# Patient Record
Sex: Female | Born: 1945 | Race: Black or African American | Hispanic: No | Smoking: Former smoker
Health system: Southern US, Community
[De-identification: ages and names within clinical notes are randomized; demographics above are authoritative.]

## PROBLEM LIST (undated history)

## (undated) DIAGNOSIS — Z9109 Other allergy status, other than to drugs and biological substances: Secondary | ICD-10-CM

## (undated) DIAGNOSIS — J449 Chronic obstructive pulmonary disease, unspecified: Secondary | ICD-10-CM

## (undated) DIAGNOSIS — E7800 Pure hypercholesterolemia, unspecified: Secondary | ICD-10-CM

## (undated) DIAGNOSIS — M6281 Muscle weakness (generalized): Secondary | ICD-10-CM

## (undated) DIAGNOSIS — Q248 Other specified congenital malformations of heart: Secondary | ICD-10-CM

## (undated) DIAGNOSIS — M1990 Unspecified osteoarthritis, unspecified site: Secondary | ICD-10-CM

## (undated) DIAGNOSIS — I2510 Atherosclerotic heart disease of native coronary artery without angina pectoris: Secondary | ICD-10-CM

## (undated) DIAGNOSIS — Z9981 Dependence on supplemental oxygen: Secondary | ICD-10-CM

## (undated) DIAGNOSIS — F419 Anxiety disorder, unspecified: Secondary | ICD-10-CM

## (undated) DIAGNOSIS — E119 Type 2 diabetes mellitus without complications: Secondary | ICD-10-CM

## (undated) DIAGNOSIS — K219 Gastro-esophageal reflux disease without esophagitis: Secondary | ICD-10-CM

## (undated) DIAGNOSIS — R600 Localized edema: Secondary | ICD-10-CM

## (undated) DIAGNOSIS — F329 Major depressive disorder, single episode, unspecified: Secondary | ICD-10-CM

## (undated) DIAGNOSIS — N189 Chronic kidney disease, unspecified: Secondary | ICD-10-CM

## (undated) DIAGNOSIS — R062 Wheezing: Secondary | ICD-10-CM

## (undated) DIAGNOSIS — G4733 Obstructive sleep apnea (adult) (pediatric): Secondary | ICD-10-CM

## (undated) DIAGNOSIS — F32A Depression, unspecified: Secondary | ICD-10-CM

## (undated) DIAGNOSIS — M719 Bursopathy, unspecified: Secondary | ICD-10-CM

## (undated) DIAGNOSIS — J40 Bronchitis, not specified as acute or chronic: Secondary | ICD-10-CM

## (undated) DIAGNOSIS — E669 Obesity, unspecified: Secondary | ICD-10-CM

## (undated) DIAGNOSIS — I509 Heart failure, unspecified: Secondary | ICD-10-CM

## (undated) DIAGNOSIS — R053 Chronic cough: Secondary | ICD-10-CM

## (undated) DIAGNOSIS — R05 Cough: Secondary | ICD-10-CM

## (undated) DIAGNOSIS — I10 Essential (primary) hypertension: Secondary | ICD-10-CM

## (undated) DIAGNOSIS — F209 Schizophrenia, unspecified: Secondary | ICD-10-CM

## (undated) DIAGNOSIS — I517 Cardiomegaly: Secondary | ICD-10-CM

## (undated) DIAGNOSIS — R251 Tremor, unspecified: Secondary | ICD-10-CM

## (undated) HISTORY — DX: Obstructive sleep apnea (adult) (pediatric): G47.33

## (undated) HISTORY — PX: RENAL BIOPSY: SHX156

## (undated) HISTORY — DX: Type 2 diabetes mellitus without complications: E11.9

## (undated) HISTORY — PX: JOINT REPLACEMENT: SHX530

## (undated) HISTORY — DX: Cardiomegaly: I51.7

## (undated) HISTORY — PX: EYE SURGERY: SHX253

## (undated) HISTORY — DX: Obesity, unspecified: E66.9

## (undated) HISTORY — PX: TONSILLECTOMY: SUR1361

## (undated) HISTORY — PX: KNEE RECONSTRUCTION, MEDIAL PATELLAR FEMORAL LIGAMENT: SHX1898

## (undated) HISTORY — DX: Other specified congenital malformations of heart: Q24.8

## (undated) HISTORY — PX: CHOLECYSTECTOMY: SHX55

---

## 2003-05-25 ENCOUNTER — Other Ambulatory Visit: Payer: Self-pay

## 2003-07-12 ENCOUNTER — Other Ambulatory Visit: Payer: Self-pay

## 2004-04-17 ENCOUNTER — Ambulatory Visit: Payer: Self-pay

## 2004-12-22 ENCOUNTER — Inpatient Hospital Stay: Payer: Self-pay | Admitting: Psychiatry

## 2004-12-23 ENCOUNTER — Other Ambulatory Visit: Payer: Self-pay

## 2005-01-12 ENCOUNTER — Other Ambulatory Visit: Payer: Self-pay

## 2005-02-05 ENCOUNTER — Ambulatory Visit: Payer: Self-pay | Admitting: Unknown Physician Specialty

## 2005-04-03 ENCOUNTER — Emergency Department: Payer: Self-pay | Admitting: Emergency Medicine

## 2006-02-17 ENCOUNTER — Emergency Department: Payer: Self-pay | Admitting: Unknown Physician Specialty

## 2006-02-17 ENCOUNTER — Ambulatory Visit: Payer: Self-pay

## 2006-02-17 ENCOUNTER — Other Ambulatory Visit: Payer: Self-pay

## 2006-03-01 ENCOUNTER — Inpatient Hospital Stay: Payer: Self-pay | Admitting: General Surgery

## 2006-03-01 ENCOUNTER — Other Ambulatory Visit: Payer: Self-pay

## 2006-03-18 ENCOUNTER — Emergency Department (HOSPITAL_COMMUNITY): Admission: EM | Admit: 2006-03-18 | Discharge: 2006-03-18 | Payer: Self-pay | Admitting: Emergency Medicine

## 2006-12-10 ENCOUNTER — Emergency Department: Payer: Self-pay | Admitting: Emergency Medicine

## 2006-12-11 ENCOUNTER — Emergency Department: Payer: Self-pay | Admitting: Emergency Medicine

## 2008-01-09 ENCOUNTER — Emergency Department: Payer: Self-pay | Admitting: Internal Medicine

## 2008-01-20 ENCOUNTER — Emergency Department: Payer: Self-pay | Admitting: Emergency Medicine

## 2008-07-02 ENCOUNTER — Ambulatory Visit: Payer: Self-pay | Admitting: Nurse Practitioner

## 2008-07-09 ENCOUNTER — Encounter: Payer: Self-pay | Admitting: Nurse Practitioner

## 2008-07-23 ENCOUNTER — Encounter: Payer: Self-pay | Admitting: Nurse Practitioner

## 2009-11-19 ENCOUNTER — Emergency Department: Payer: Self-pay | Admitting: Emergency Medicine

## 2009-12-16 ENCOUNTER — Emergency Department: Payer: Self-pay | Admitting: Emergency Medicine

## 2010-02-20 ENCOUNTER — Emergency Department: Payer: Self-pay | Admitting: Emergency Medicine

## 2010-04-12 ENCOUNTER — Emergency Department: Payer: Self-pay | Admitting: Emergency Medicine

## 2010-06-11 ENCOUNTER — Emergency Department: Payer: Self-pay | Admitting: Emergency Medicine

## 2010-11-01 ENCOUNTER — Emergency Department: Payer: Self-pay | Admitting: Emergency Medicine

## 2010-11-18 ENCOUNTER — Emergency Department (HOSPITAL_COMMUNITY)
Admission: EM | Admit: 2010-11-18 | Discharge: 2010-11-18 | Disposition: A | Discharge status: Home / Self Care | Payer: Medicare Other | Attending: Emergency Medicine | Admitting: Emergency Medicine

## 2010-11-18 ENCOUNTER — Other Ambulatory Visit: Payer: Self-pay

## 2010-11-18 ENCOUNTER — Encounter: Payer: Self-pay | Admitting: Emergency Medicine

## 2010-11-18 DIAGNOSIS — M67919 Unspecified disorder of synovium and tendon, unspecified shoulder: Secondary | ICD-10-CM | POA: Insufficient documentation

## 2010-11-18 DIAGNOSIS — F209 Schizophrenia, unspecified: Secondary | ICD-10-CM | POA: Insufficient documentation

## 2010-11-18 DIAGNOSIS — E119 Type 2 diabetes mellitus without complications: Secondary | ICD-10-CM | POA: Insufficient documentation

## 2010-11-18 DIAGNOSIS — M719 Bursopathy, unspecified: Secondary | ICD-10-CM | POA: Insufficient documentation

## 2010-11-18 DIAGNOSIS — E7800 Pure hypercholesterolemia, unspecified: Secondary | ICD-10-CM | POA: Insufficient documentation

## 2010-11-18 DIAGNOSIS — I10 Essential (primary) hypertension: Secondary | ICD-10-CM

## 2010-11-18 HISTORY — DX: Bursopathy, unspecified: M71.9

## 2010-11-18 HISTORY — DX: Unspecified osteoarthritis, unspecified site: M19.90

## 2010-11-18 HISTORY — DX: Essential (primary) hypertension: I10

## 2010-11-18 HISTORY — DX: Pure hypercholesterolemia, unspecified: E78.00

## 2010-11-18 HISTORY — DX: Schizophrenia, unspecified: F20.9

## 2010-11-18 LAB — CBC
HCT: 44.2 % (ref 36.0–46.0)
MCHC: 32.8 g/dL (ref 30.0–36.0)
MCV: 86.7 fL (ref 78.0–100.0)
Platelets: 387 10*3/uL (ref 150–400)
RDW: 13.7 % (ref 11.5–15.5)

## 2010-11-18 LAB — DIFFERENTIAL
Basophils Absolute: 0.1 10*3/uL (ref 0.0–0.1)
Basophils Relative: 0 % (ref 0–1)
Eosinophils Absolute: 0.1 10*3/uL (ref 0.0–0.7)
Eosinophils Relative: 1 % (ref 0–5)
Lymphocytes Relative: 28 % (ref 12–46)
Monocytes Absolute: 1.2 10*3/uL — ABNORMAL HIGH (ref 0.1–1.0)
Neutro Abs: 8.3 10*3/uL — ABNORMAL HIGH (ref 1.7–7.7)
Neutrophils Relative %: 62 % (ref 43–77)

## 2010-11-18 LAB — COMPREHENSIVE METABOLIC PANEL
AST: 25 U/L (ref 0–37)
Albumin: 3.6 g/dL (ref 3.5–5.2)
Alkaline Phosphatase: 76 U/L (ref 39–117)
BUN: 18 mg/dL (ref 6–23)
Potassium: 3.5 mEq/L (ref 3.5–5.1)
Total Bilirubin: 0.2 mg/dL — ABNORMAL LOW (ref 0.3–1.2)

## 2010-11-18 LAB — CK TOTAL AND CKMB (NOT AT ARMC)
CK, MB: 3.2 ng/mL (ref 0.3–4.0)
Total CK: 182 U/L — ABNORMAL HIGH (ref 7–177)

## 2010-11-18 NOTE — ED Notes (Signed)
Pt requesting food.EDP aware.  Pt advocate taking pt food at this time.

## 2010-11-18 NOTE — ED Notes (Signed)
Patient is wanting something to eat. I told patient that she had to wait and be seen by the doctor before she could have anything to eat or drink.

## 2010-11-18 NOTE — ED Provider Notes (Addendum)
History    Scribed for Geoffery Lyons, MD, the patient was seen in room APA07/APA07. This chart was scribed by Katha Cabal. This patient's care was started at 17:00.     CSN: [id number] Arrival date & time: 11/18/2010  4:55 PM  Chief Complaint  Patient presents with  . Hypertension    HPI  [name] [name] is a 65 y.o. female patient who was sent to ED by [facility] center for elevated blood pressure.  Patient denies any chest pain, sob, or other complaints.  This was noted today at the doctors office.  Unsure of how long it has been high.  There are no aggravating or alleviating factors.  She does not recall if she took her meds today or not.      Patient has bursitis  In both shoulders.  Pain worsens while rising arm.    With associated   Denies headache, chest pain, vomiting,  Previous Hx  Denies MI and stents.   PCP  HPI ELEMENTS:  Location:  Onset: today Duration: several hours  Timing: improving  Quality:   Severity: moderate  Modifying factors: pain from bursitis in shoulders  Context: none as above  Associated symptoms: as above.  PAST MEDICAL HISTORY:  Past Medical History  Diagnosis Date  . Diabetes mellitus   . Hypertension   . Hypercholesteremia   . Schizophrenia   . Osteoarthritis   . Bursitis     PAST SURGICAL HISTORY:  Past Surgical History  Procedure Date  . Tonsillectomy   . Knee reconstruction, medial patellar femoral ligament   . Cholecystectomy     FAMILY HISTORY:  History reviewed. No pertinent family history.   SOCIAL HISTORY: History   Social History  . Marital Status: Unknown    Spouse Name: N/A    Number of Children: N/A  . Years of Education: N/A   Social History Main Topics  . Smoking status: Former Games developer  . Smokeless tobacco: None  . Alcohol Use: No  . Drug Use: No  . Sexually Active:    Other Topics Concern  . None   Social History Narrative  . None    Review of Systems 10 Systems reviewed and  are negative for acute change except as noted in the HPI.  Allergies  Haldol  Home Medications  No current outpatient prescriptions on file.  BP 143/127  Pulse 82  Temp(Src) 98.1 F (36.7 C) (Oral)  Resp 20  Ht 5\' 6"  (1.676 m)  Wt 271 lb (122.925 kg)  BMI 43.74 kg/m2  SpO2 98%  Physical Exam  Nursing note and vitals reviewed. Constitutional: She is oriented to person, place, and time. She appears well-developed and well-nourished. No distress.  HENT:  Head: Normocephalic and atraumatic.  Mouth/Throat: Oropharynx is clear and moist.  Eyes: EOM are normal. Pupils are equal, round, and reactive to light.  Neck: Normal range of motion. Neck supple.  Cardiovascular: Normal rate, regular rhythm and normal heart sounds.   No murmur heard. Pulmonary/Chest: Effort normal and breath sounds normal. No respiratory distress. She has no wheezes. She has no rales.  Abdominal: Soft. Bowel sounds are normal. There is no tenderness. There is no rebound and no guarding.  Musculoskeletal: Normal range of motion. She exhibits no edema (peripheral ).  Neurological: She is alert and oriented to person, place, and time. No sensory deficit. Coordination normal.  Skin: Skin is warm and dry. No rash noted. She is not diaphoretic.  Psychiatric: She has a normal  mood and affect. Her speech is normal.    ED Course  Procedures (including critical care time)  OTHER DATA REVIEWED: Nursing notes, vital signs, and past medical records reviewed.   DIAGNOSTIC STUDIES: Oxygen Saturation is 98% on room air, normal by my interpretation.      Input EKG    LABS / RADIOLOGY:  Results for orders placed during the hospital encounter of 11/18/10  CBC      Component Value Range   WBC 13.5 (*) 4.0 - 10.5 (K/uL)   RBC 5.10  3.87 - 5.11 (MIL/uL)   Hemoglobin 14.5  12.0 - 15.0 (g/dL)   HCT 16.1  09.6 - 04.5 (%)   MCV 86.7  78.0 - 100.0 (fL)   MCH 28.4  26.0 - 34.0 (pg)   MCHC 32.8  30.0 - 36.0 (g/dL)   RDW  40.9  81.1 - 91.4 (%)   Platelets 387  150 - 400 (K/uL)  DIFFERENTIAL      Component Value Range   Neutrophils Relative 62  43 - 77 (%)   Neutro Abs 8.3 (*) 1.7 - 7.7 (K/uL)   Lymphocytes Relative 28  12 - 46 (%)   Lymphs Abs 3.9  0.7 - 4.0 (K/uL)   Monocytes Relative 9  3 - 12 (%)   Monocytes Absolute 1.2 (*) 0.1 - 1.0 (K/uL)   Eosinophils Relative 1  0 - 5 (%)   Eosinophils Absolute 0.1  0.0 - 0.7 (K/uL)   Basophils Relative 0  0 - 1 (%)   Basophils Absolute 0.1  0.0 - 0.1 (K/uL)   .  No results found.     ED COURSE / COORDINATION OF CARE: 5:14 PM  Physical exam complete.  Will order cardiac markers and EKG.    Orders Placed This Encounter  Procedures  . CBC  . Differential  . Comprehensive metabolic panel  . CK total and CKMB  . Troponin I  . EKG test         MDM   MDM:   Raynelle Fanning shows nsr at 71 bpm.  BP has come down here without intervention.  Looks fine by exam and labs are unremarkable.  Will discharge with bp monitoring, follow up with pcp.   IMPRESSION: Diagnoses that have been ruled out:  Diagnoses that are still under consideration:  Final diagnoses:     MEDICATIONS GIVEN IN THE E.D. Scheduled Meds:   Continuous Infusions:      DISCHARGE MEDICATIONS: New Prescriptions   No medications on file     I personally performed the services described in this documentation, which was scribed in my presence. The recorded information has been reviewed and considered. Geoffery Lyons, MD            Geoffery Lyons, MD 11/18/10 Bennie Hind  Geoffery Lyons, MD 12/10/10 [phone]

## 2010-11-18 NOTE — ED Notes (Signed)
Pt went to [facility] today for "bursitis in both of my arms". Pt had elevated bp at office so sent to ED. Pt cbg in route 142. Pt alert/oriented. Nad. Pt could not remember if took meds today or not. Denies cp/sob or headaches.

## 2010-11-25 ENCOUNTER — Emergency Department: Payer: Self-pay | Admitting: *Deleted

## 2011-04-23 HISTORY — PX: COLONOSCOPY: SHX174

## 2011-05-11 ENCOUNTER — Emergency Department: Payer: Self-pay | Admitting: Emergency Medicine

## 2011-05-11 LAB — URINALYSIS, COMPLETE
Bilirubin,UR: NEGATIVE
Hyaline Cast: 51
Ketone: NEGATIVE
Nitrite: NEGATIVE
Ph: 5 (ref 4.5–8.0)
Protein: 100
RBC,UR: 1 /HPF (ref 0–5)
Specific Gravity: 1.017 (ref 1.003–1.030)
Squamous Epithelial: 2

## 2011-05-11 LAB — CBC
HCT: 42.8 % (ref 35.0–47.0)
HGB: 14.1 g/dL (ref 12.0–16.0)
MCV: 86 fL (ref 80–100)
RDW: 12.9 % (ref 11.5–14.5)
WBC: 11.4 10*3/uL — ABNORMAL HIGH (ref 3.6–11.0)

## 2011-05-11 LAB — COMPREHENSIVE METABOLIC PANEL
Alkaline Phosphatase: 61 U/L (ref 50–136)
Anion Gap: 16 (ref 7–16)
Calcium, Total: 9 mg/dL (ref 8.5–10.1)
Co2: 25 mmol/L (ref 21–32)
EGFR (Non-African Amer.): 39 — ABNORMAL LOW
Osmolality: 291 (ref 275–301)
Sodium: 145 mmol/L (ref 136–145)

## 2011-05-11 LAB — DRUG SCREEN, URINE
Barbiturates, Ur Screen: NEGATIVE (ref ?–200)
Benzodiazepine, Ur Scrn: NEGATIVE (ref ?–200)
Cannabinoid 50 Ng, Ur ~~LOC~~: NEGATIVE (ref ?–50)
MDMA (Ecstasy)Ur Screen: NEGATIVE (ref ?–500)
Methadone, Ur Screen: NEGATIVE (ref ?–300)
Phencyclidine (PCP) Ur S: NEGATIVE (ref ?–25)

## 2011-05-11 LAB — ETHANOL
Ethanol %: <0.003 % (ref 0.000–0.080)
Ethanol: <3 mg/dL

## 2011-06-18 ENCOUNTER — Emergency Department: Payer: Self-pay | Admitting: *Deleted

## 2011-06-24 ENCOUNTER — Emergency Department (HOSPITAL_COMMUNITY): Payer: Medicare Other

## 2011-06-24 ENCOUNTER — Inpatient Hospital Stay (HOSPITAL_COMMUNITY): Payer: Medicare Other

## 2011-06-24 ENCOUNTER — Inpatient Hospital Stay (HOSPITAL_COMMUNITY)
Admission: EM | Admit: 2011-06-24 | Discharge: 2011-06-30 | DRG: 394 | Disposition: A | Discharge status: Other Acute Inpatient Hospital | Payer: Medicare Other | Attending: Internal Medicine | Admitting: Internal Medicine

## 2011-06-24 ENCOUNTER — Encounter (HOSPITAL_COMMUNITY): Payer: Self-pay | Admitting: Emergency Medicine

## 2011-06-24 DIAGNOSIS — I10 Essential (primary) hypertension: Secondary | ICD-10-CM

## 2011-06-24 DIAGNOSIS — K625 Hemorrhage of anus and rectum: Secondary | ICD-10-CM | POA: Diagnosis present

## 2011-06-24 DIAGNOSIS — R059 Cough, unspecified: Secondary | ICD-10-CM | POA: Diagnosis not present

## 2011-06-24 DIAGNOSIS — M899 Disorder of bone, unspecified: Secondary | ICD-10-CM | POA: Diagnosis present

## 2011-06-24 DIAGNOSIS — E119 Type 2 diabetes mellitus without complications: Secondary | ICD-10-CM | POA: Diagnosis present

## 2011-06-24 DIAGNOSIS — M1990 Unspecified osteoarthritis, unspecified site: Secondary | ICD-10-CM | POA: Diagnosis present

## 2011-06-24 DIAGNOSIS — J029 Acute pharyngitis, unspecified: Secondary | ICD-10-CM | POA: Diagnosis not present

## 2011-06-24 DIAGNOSIS — K649 Unspecified hemorrhoids: Principal | ICD-10-CM | POA: Diagnosis present

## 2011-06-24 DIAGNOSIS — E876 Hypokalemia: Secondary | ICD-10-CM | POA: Diagnosis present

## 2011-06-24 DIAGNOSIS — E7800 Pure hypercholesterolemia, unspecified: Secondary | ICD-10-CM | POA: Diagnosis present

## 2011-06-24 DIAGNOSIS — N289 Disorder of kidney and ureter, unspecified: Secondary | ICD-10-CM | POA: Diagnosis present

## 2011-06-24 DIAGNOSIS — M898X5 Other specified disorders of bone, thigh: Secondary | ICD-10-CM | POA: Diagnosis present

## 2011-06-24 DIAGNOSIS — R05 Cough: Secondary | ICD-10-CM | POA: Diagnosis not present

## 2011-06-24 DIAGNOSIS — F209 Schizophrenia, unspecified: Secondary | ICD-10-CM

## 2011-06-24 DIAGNOSIS — N2889 Other specified disorders of kidney and ureter: Secondary | ICD-10-CM | POA: Diagnosis present

## 2011-06-24 DIAGNOSIS — M949 Disorder of cartilage, unspecified: Secondary | ICD-10-CM | POA: Diagnosis present

## 2011-06-24 LAB — BASIC METABOLIC PANEL
CO2: 29 mEq/L (ref 19–32)
Creatinine, Ser: 0.79 mg/dL (ref 0.50–1.10)
Potassium: 3.3 mEq/L — ABNORMAL LOW (ref 3.5–5.1)
Sodium: 141 mEq/L (ref 135–145)

## 2011-06-24 LAB — GLUCOSE, CAPILLARY: Glucose-Capillary: 182 mg/dL — ABNORMAL HIGH (ref 70–99)

## 2011-06-24 LAB — DIFFERENTIAL
Basophils Relative: 0 % (ref 0–1)
Eosinophils Relative: 2 % (ref 0–5)
Lymphs Abs: 4 10*3/uL (ref 0.7–4.0)
Monocytes Absolute: 0.9 10*3/uL (ref 0.1–1.0)
Neutrophils Relative %: 54 % (ref 43–77)

## 2011-06-24 LAB — CBC
MCH: 27.7 pg (ref 26.0–34.0)
WBC: 11.1 10*3/uL — ABNORMAL HIGH (ref 4.0–10.5)

## 2011-06-24 LAB — TYPE AND SCREEN

## 2011-06-24 LAB — OCCULT BLOOD, POC DEVICE: Fecal Occult Bld: POSITIVE

## 2011-06-24 MED ORDER — INSULIN ASPART 100 UNIT/ML ~~LOC~~ SOLN
0.0000 [IU] | Freq: Three times a day (TID) | SUBCUTANEOUS | Status: DC
  Administered 2011-06-25 – 2011-06-26 (×4): 3 [IU] via SUBCUTANEOUS
  Administered 2011-06-26 (×2): 2 [IU] via SUBCUTANEOUS
  Administered 2011-06-27 (×3): 3 [IU] via SUBCUTANEOUS
  Administered 2011-06-29 – 2011-06-30 (×3): 5 [IU] via SUBCUTANEOUS
  Administered 2011-06-30: 3 [IU] via SUBCUTANEOUS

## 2011-06-24 MED ORDER — DIVALPROEX SODIUM 250 MG PO DR TAB
500.0000 mg | DELAYED_RELEASE_TABLET | Freq: Every day | ORAL | Status: DC
  Filled 2011-06-24 (×4): qty 2

## 2011-06-24 MED ORDER — MORPHINE SULFATE 4 MG/ML IJ SOLN
4.0000 mg | Freq: Once | INTRAMUSCULAR | Status: AC
  Administered 2011-06-24: 4 mg via INTRAVENOUS
  Filled 2011-06-24: qty 1

## 2011-06-24 MED ORDER — ZIPRASIDONE HCL 40 MG PO CAPS
40.0000 mg | ORAL_CAPSULE | Freq: Every day | ORAL | Status: DC
  Filled 2011-06-24 (×8): qty 1

## 2011-06-24 MED ORDER — SODIUM CHLORIDE 0.9 % IV SOLN
Freq: Once | INTRAVENOUS | Status: AC
  Administered 2011-06-24: 14:00:00 via INTRAVENOUS

## 2011-06-24 MED ORDER — FLUPHENAZINE HCL 5 MG PO TABS
ORAL_TABLET | ORAL | Status: AC
  Filled 2011-06-24: qty 1

## 2011-06-24 MED ORDER — FUROSEMIDE 20 MG PO TABS
20.0000 mg | ORAL_TABLET | Freq: Every day | ORAL | Status: DC
  Administered 2011-06-24 – 2011-06-29 (×5): 20 mg via ORAL
  Filled 2011-06-24 (×6): qty 1

## 2011-06-24 MED ORDER — ACETAMINOPHEN 325 MG PO TABS: 650.0000 mg | ORAL_TABLET | Freq: Four times a day (QID) | ORAL | Status: DC | PRN

## 2011-06-24 MED ORDER — LOSARTAN POTASSIUM 50 MG PO TABS
50.0000 mg | ORAL_TABLET | Freq: Every day | ORAL | Status: DC
  Administered 2011-06-24 – 2011-06-29 (×4): 50 mg via ORAL
  Filled 2011-06-24 (×6): qty 1

## 2011-06-24 MED ORDER — TRAMADOL HCL 50 MG PO TABS: 50.0000 mg | ORAL_TABLET | Freq: Four times a day (QID) | ORAL | Status: DC | PRN

## 2011-06-24 MED ORDER — ACETAMINOPHEN 650 MG RE SUPP: 650.0000 mg | Freq: Four times a day (QID) | RECTAL | Status: DC | PRN

## 2011-06-24 MED ORDER — POTASSIUM CHLORIDE CRYS ER 20 MEQ PO TBCR
40.0000 meq | EXTENDED_RELEASE_TABLET | Freq: Once | ORAL | Status: AC
  Administered 2011-06-24: 40 meq via ORAL
  Filled 2011-06-24: qty 2

## 2011-06-24 MED ORDER — SODIUM CHLORIDE 0.9 % IV SOLN: INTRAVENOUS | Status: DC

## 2011-06-24 MED ORDER — ONDANSETRON HCL 4 MG/2ML IJ SOLN: 4.0000 mg | Freq: Four times a day (QID) | INTRAMUSCULAR | Status: DC | PRN

## 2011-06-24 MED ORDER — TRAZODONE HCL 50 MG PO TABS
200.0000 mg | ORAL_TABLET | Freq: Every day | ORAL | Status: DC
  Administered 2011-06-24: 200 mg via ORAL
  Filled 2011-06-24 (×3): qty 4

## 2011-06-24 MED ORDER — FLUPHENAZINE HCL 5 MG PO TABS
5.0000 mg | ORAL_TABLET | Freq: Every day | ORAL | Status: DC
  Administered 2011-06-24 – 2011-06-29 (×4): 5 mg via ORAL
  Filled 2011-06-24 (×8): qty 1

## 2011-06-24 MED ORDER — INSULIN ASPART 100 UNIT/ML ~~LOC~~ SOLN
0.0000 [IU] | Freq: Every day | SUBCUTANEOUS | Status: DC
  Administered 2011-06-29: 2 [IU] via SUBCUTANEOUS

## 2011-06-24 MED ORDER — ADULT MULTIVITAMIN W/MINERALS CH
1.0000 | ORAL_TABLET | Freq: Every day | ORAL | Status: DC
  Administered 2011-06-24 – 2011-06-29 (×5): 1 via ORAL
  Filled 2011-06-24 (×6): qty 1

## 2011-06-24 MED ORDER — HYDROCODONE-ACETAMINOPHEN 5-325 MG PO TABS
1.0000 | ORAL_TABLET | Freq: Three times a day (TID) | ORAL | Status: DC
  Administered 2011-06-24 – 2011-06-25 (×4): 1 via ORAL
  Filled 2011-06-24 (×4): qty 1

## 2011-06-24 MED ORDER — CLONIDINE HCL 0.1 MG PO TABS
0.1000 mg | ORAL_TABLET | Freq: Four times a day (QID) | ORAL | Status: DC | PRN
  Administered 2011-06-29: 0.1 mg via ORAL
  Filled 2011-06-24 (×2): qty 1

## 2011-06-24 MED ORDER — CLONIDINE HCL 0.2 MG/24HR TD PTWK
0.2000 mg | MEDICATED_PATCH | TRANSDERMAL | Status: DC
  Administered 2011-06-24: 0.2 mg via TRANSDERMAL
  Filled 2011-06-24: qty 1

## 2011-06-24 MED ORDER — ONDANSETRON HCL 4 MG PO TABS
4.0000 mg | ORAL_TABLET | Freq: Four times a day (QID) | ORAL | Status: DC | PRN
  Filled 2011-06-24: qty 1

## 2011-06-24 MED ORDER — LISINOPRIL 10 MG PO TABS
20.0000 mg | ORAL_TABLET | Freq: Every day | ORAL | Status: DC
  Administered 2011-06-24 – 2011-06-29 (×5): 20 mg via ORAL
  Filled 2011-06-24 (×3): qty 2
  Filled 2011-06-24: qty 1
  Filled 2011-06-24 (×2): qty 2

## 2011-06-24 MED ORDER — FAMOTIDINE 20 MG PO TABS
20.0000 mg | ORAL_TABLET | Freq: Every day | ORAL | Status: DC
  Administered 2011-06-24 – 2011-06-25 (×2): 20 mg via ORAL
  Filled 2011-06-24 (×2): qty 1

## 2011-06-24 MED ORDER — ZIPRASIDONE HCL 40 MG PO CAPS
ORAL_CAPSULE | ORAL | Status: AC
  Filled 2011-06-24: qty 1

## 2011-06-24 MED ORDER — PANTOPRAZOLE SODIUM 40 MG IV SOLR
40.0000 mg | INTRAVENOUS | Status: DC
  Administered 2011-06-24: 40 mg via INTRAVENOUS
  Filled 2011-06-24: qty 40

## 2011-06-24 MED ORDER — AMLODIPINE BESYLATE 5 MG PO TABS
10.0000 mg | ORAL_TABLET | Freq: Every day | ORAL | Status: DC
  Administered 2011-06-24 – 2011-06-29 (×5): 10 mg via ORAL
  Filled 2011-06-24 (×6): qty 2

## 2011-06-24 MED ORDER — CHLORTHALIDONE 25 MG PO TABS
25.0000 mg | ORAL_TABLET | Freq: Every day | ORAL | Status: DC
  Administered 2011-06-24 – 2011-06-29 (×4): 25 mg via ORAL
  Filled 2011-06-24 (×9): qty 1

## 2011-06-24 MED ORDER — CHLORTHALIDONE 25 MG PO TABS
ORAL_TABLET | ORAL | Status: AC
  Filled 2011-06-24: qty 1

## 2011-06-24 NOTE — ED Notes (Signed)
Report given to [place], RN unit 300. Room is in the process of being cleaned. Will transport patient to floor when room is finished.

## 2011-06-24 NOTE — Progress Notes (Signed)
To CT per wheelchair. Contrast complete.
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
## 2011-06-24 NOTE — ED Notes (Signed)
Pt requested blood pressure recheck in left arm it is 159/96.

## 2011-06-25 ENCOUNTER — Inpatient Hospital Stay (HOSPITAL_COMMUNITY): Payer: Medicare Other

## 2011-06-25 ENCOUNTER — Encounter (HOSPITAL_COMMUNITY): Payer: Self-pay | Admitting: Gastroenterology

## 2011-06-25 DIAGNOSIS — F209 Schizophrenia, unspecified: Secondary | ICD-10-CM

## 2011-06-25 DIAGNOSIS — I10 Essential (primary) hypertension: Secondary | ICD-10-CM

## 2011-06-25 DIAGNOSIS — M898X5 Other specified disorders of bone, thigh: Secondary | ICD-10-CM | POA: Diagnosis present

## 2011-06-25 DIAGNOSIS — K625 Hemorrhage of anus and rectum: Secondary | ICD-10-CM

## 2011-06-25 LAB — BASIC METABOLIC PANEL WITH GFR
BUN: 13 mg/dL (ref 6–23)
CO2: 30 meq/L (ref 19–32)
Calcium: 9.4 mg/dL (ref 8.4–10.5)
Chloride: 97 meq/L (ref 96–112)
Creatinine, Ser: 0.9 mg/dL (ref 0.50–1.10)
GFR calc Af Amer: 76 mL/min — ABNORMAL LOW
GFR calc non Af Amer: 65 mL/min — ABNORMAL LOW
Glucose, Bld: 159 mg/dL — ABNORMAL HIGH (ref 70–99)
Potassium: 3.3 meq/L — ABNORMAL LOW (ref 3.5–5.1)
Sodium: 138 meq/L (ref 135–145)

## 2011-06-25 LAB — CBC
HCT: 40.8 % (ref 36.0–46.0)
Hemoglobin: 12.9 g/dL (ref 12.0–15.0)
MCH: 27.1 pg (ref 26.0–34.0)
MCHC: 31.6 g/dL (ref 30.0–36.0)
MCV: 85.7 fL (ref 78.0–100.0)
Platelets: 298 K/uL (ref 150–400)
RBC: 4.76 MIL/uL (ref 3.87–5.11)
RDW: 13.9 % (ref 11.5–15.5)
WBC: 9.9 K/uL (ref 4.0–10.5)

## 2011-06-25 LAB — GLUCOSE, CAPILLARY
Glucose-Capillary: 168 mg/dL — ABNORMAL HIGH (ref 70–99)
Glucose-Capillary: 175 mg/dL — ABNORMAL HIGH (ref 70–99)

## 2011-06-25 MED ORDER — POTASSIUM CHLORIDE CRYS ER 20 MEQ PO TBCR
40.0000 meq | EXTENDED_RELEASE_TABLET | Freq: Every day | ORAL | Status: DC
  Administered 2011-06-25 – 2011-06-29 (×4): 40 meq via ORAL
  Filled 2011-06-25 (×5): qty 2

## 2011-06-25 MED ORDER — SODIUM CHLORIDE 0.9 % IJ SOLN
INTRAMUSCULAR | Status: AC
  Administered 2011-06-25: 10 mL
  Filled 2011-06-25: qty 3

## 2011-06-25 MED ORDER — PANTOPRAZOLE SODIUM 40 MG PO TBEC
40.0000 mg | DELAYED_RELEASE_TABLET | Freq: Every day | ORAL | Status: DC
  Administered 2011-06-25 – 2011-06-26 (×2): 40 mg via ORAL
  Filled 2011-06-25 (×2): qty 1

## 2011-06-25 MED ORDER — LORAZEPAM 2 MG/ML IJ SOLN
2.0000 mg | Freq: Once | INTRAMUSCULAR | Status: AC
  Administered 2011-06-25: 2 mg via INTRAVENOUS
  Filled 2011-06-25: qty 1

## 2011-06-25 MED ORDER — HYDROCORTISONE ACETATE 25 MG RE SUPP
25.0000 mg | Freq: Two times a day (BID) | RECTAL | Status: DC
  Administered 2011-06-25 – 2011-06-27 (×3): 25 mg via RECTAL
  Filled 2011-06-25 (×15): qty 1

## 2011-06-25 NOTE — Progress Notes (Signed)
Patient up in chair for about an hour.

## 2011-06-25 NOTE — Consult Note (Signed)
LOW VOLUME HEMATOCHEZIA. HOLD ASA. FULL LIQUID DIET. ANUSOL HC PR BID FOR 10 DAYS.  REVIEWED.

## 2011-06-25 NOTE — Care Management Note (Signed)
    Page 1 of 1   06/30/2011     1:19:27 PM   CARE MANAGEMENT NOTE 06/30/2011  Patient:  [name] [name], [name] [name]   Account Number:  [id number]  Date Initiated:  06/25/2011  Documentation initiated by:  Rosemary Holms  Subjective/Objective Assessment:   Pt admitted with blood in stool. Lived at home alone and states she has many friends in her church family. Walks with cane. States she previously had HH but not sure which agency she had.     Action/Plan:   Plans to DC home. Seems open to home health   Anticipated DC Date:  06/29/2011   Anticipated DC Plan:  [facility]      DC Planning Services  CM consult      Choice offered to / List presented to:             Status of service:  Completed, signed off Medicare Important Message given?   (If response is "NO", the following Medicare IM given date fields will be blank) Date Medicare IM given:   Date Additional Medicare IM given:    Discharge Disposition:  [facility]OS[facility]  Per UR Regulation:  Reviewed for med. necessity/level of care/duration of stay  If discussed at Long Length of Stay Meetings, dates discussed:    Comments:  06/30/11 1315 Keila Turan RN BSN CM Pt to be transfered to [facility] in [place] today.  05.06.13 [facility] RNBSN ACM [phone]  06/25/11 1200 Merikay Lesniewski Leanord Hawking RN BSN CM
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
## 2011-06-25 NOTE — Progress Notes (Signed)
Notified Dr. Kerry Hough that medical records from [facility] are in patient's chart.

## 2011-06-25 NOTE — Progress Notes (Signed)
Received phone call from radiology who reported [name] [name] showed worrisome solid mass on right kidney and recommended MRI. Paged Dr. Kerry Hough. Patient also refused her CT because she will not let anyone start another IV. Dr. Kerry Hough also notified of this.
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
## 2011-06-26 DIAGNOSIS — K625 Hemorrhage of anus and rectum: Secondary | ICD-10-CM

## 2011-06-26 DIAGNOSIS — F209 Schizophrenia, unspecified: Secondary | ICD-10-CM

## 2011-06-26 DIAGNOSIS — N2889 Other specified disorders of kidney and ureter: Secondary | ICD-10-CM | POA: Diagnosis present

## 2011-06-26 DIAGNOSIS — I10 Essential (primary) hypertension: Secondary | ICD-10-CM

## 2011-06-26 LAB — GLUCOSE, CAPILLARY
Glucose-Capillary: 162 mg/dL — ABNORMAL HIGH (ref 70–99)
Glucose-Capillary: 148 mg/dL — ABNORMAL HIGH (ref 70–99)
Glucose-Capillary: 122 mg/dL — ABNORMAL HIGH (ref 70–99)

## 2011-06-26 LAB — COMPREHENSIVE METABOLIC PANEL
ALT: 39 U/L — ABNORMAL HIGH (ref 0–35)
AST: 34 U/L (ref 0–37)
Alkaline Phosphatase: 80 U/L (ref 39–117)
CO2: 30 mEq/L (ref 19–32)
Calcium: 9.7 mg/dL (ref 8.4–10.5)
Chloride: 97 mEq/L (ref 96–112)
GFR calc Af Amer: 85 mL/min — ABNORMAL LOW (ref >90)
GFR calc non Af Amer: 73 mL/min — ABNORMAL LOW (ref >90)
Glucose, Bld: 177 mg/dL — ABNORMAL HIGH (ref 70–99)
Sodium: 137 mEq/L (ref 135–145)
Total Bilirubin: 0.4 mg/dL (ref 0.3–1.2)

## 2011-06-26 LAB — CBC
Hemoglobin: 13.9 g/dL (ref 12.0–15.0)
MCH: 27.5 pg (ref 26.0–34.0)
Platelets: 305 10*3/uL (ref 150–400)
RBC: 5.06 MIL/uL (ref 3.87–5.11)
WBC: 9.8 10*3/uL (ref 4.0–10.5)

## 2011-06-26 NOTE — Progress Notes (Signed)
Patient returned to floor via wheelchair, radiology reports that patient refuses MRI and was not willing to cooperate for test, called Dr. Orvan Falconer and made aware, also inform Dr. Orvan Falconer that we were unable to obtain a 20g IV site for CT test - new order given to reschedule CT for tomorrow
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
## 2011-06-26 NOTE — Progress Notes (Signed)
Supervisor Tim in room to start 20g IV site that is needed for CT - was unsuccessful

## 2011-06-26 NOTE — Progress Notes (Addendum)
CPAP has been placed in room . I doubt she will wear it. But It has been placed in the room.   CPAP nasal mask place on pt finally set 5 to 15 auto titrate

## 2011-06-26 NOTE — Progress Notes (Signed)
Patient left floor via wheelchair to MRI

## 2011-06-26 NOTE — Progress Notes (Signed)
Spoke with Pt with regards to PICC.  Explained benefits and risks.  Pt. States unable to make decision without speaking with family.  States will not sign consent at this time.  Will check back later

## 2011-06-27 ENCOUNTER — Inpatient Hospital Stay (HOSPITAL_COMMUNITY): Payer: Medicare Other

## 2011-06-27 DIAGNOSIS — F209 Schizophrenia, unspecified: Secondary | ICD-10-CM

## 2011-06-27 DIAGNOSIS — K625 Hemorrhage of anus and rectum: Secondary | ICD-10-CM

## 2011-06-27 DIAGNOSIS — I10 Essential (primary) hypertension: Secondary | ICD-10-CM

## 2011-06-27 LAB — GLUCOSE, CAPILLARY: Glucose-Capillary: 159 mg/dL — ABNORMAL HIGH (ref 70–99)

## 2011-06-27 MED ORDER — FUROSEMIDE 20 MG PO TABS: 20.0000 mg | ORAL_TABLET | Freq: Every day | ORAL | Status: DC

## 2011-06-27 MED ORDER — CLONIDINE HCL 0.2 MG/24HR TD PTWK: 1.0000 | MEDICATED_PATCH | TRANSDERMAL | Status: AC

## 2011-06-27 MED ORDER — POTASSIUM CHLORIDE CRYS ER 20 MEQ PO TBCR: 40.0000 meq | EXTENDED_RELEASE_TABLET | Freq: Every day | ORAL | Status: DC

## 2011-06-27 MED ORDER — DIVALPROEX SODIUM 500 MG PO DR TAB: 500.0000 mg | DELAYED_RELEASE_TABLET | Freq: Every day | ORAL | Status: DC

## 2011-06-27 MED ORDER — LORAZEPAM 2 MG/ML IJ SOLN: 2.0000 mg | Freq: Once | INTRAMUSCULAR | Status: DC

## 2011-06-27 MED ORDER — LORAZEPAM 2 MG/ML IJ SOLN
2.0000 mg | INTRAMUSCULAR | Status: DC | PRN
  Administered 2011-06-27 – 2011-06-29 (×5): 2 mg via INTRAMUSCULAR
  Filled 2011-06-27 (×6): qty 1

## 2011-06-27 MED ORDER — HYDROCORTISONE ACETATE 25 MG RE SUPP: 25.0000 mg | Freq: Two times a day (BID) | RECTAL | Status: AC

## 2011-06-27 MED ORDER — LORAZEPAM 2 MG/ML IJ SOLN
2.0000 mg | Freq: Once | INTRAMUSCULAR | Status: AC
  Administered 2011-06-27: 2 mg via INTRAMUSCULAR
  Filled 2011-06-27: qty 1

## 2011-06-27 NOTE — Progress Notes (Addendum)
Subjective: Patient is awake this morning. She reports that the rectal bleeding has subsided.  She refused her picc line yesterday.  Objective: Vital signs in last 24 hours: Temp:  [97.5 F (36.4 C)-98.1 F (36.7 C)] 98.1 F (36.7 C) (05/05 0521) Pulse Rate:  [63-81] 81  (05/05 0521) Resp:  [18-20] 20  (05/05 0521) BP: (135-177)/(75-83) 135/83 mmHg (05/05 0521) SpO2:  [90 %-96 %] 90 % (05/05 0521) Weight change:  Last BM Date: 06/26/11  Intake/Output from previous day: 05/04 0701 - 05/05 0700 In: -  Out: 1700 [Urine:1700]     Physical Exam: General: Alert, awake, oriented x3, in no acute distress. HEENT: No bruits, no goiter. Heart: Regular rate and rhythm, without murmurs, rubs, gallops. Lungs: Clear to auscultation bilaterally. Abdomen: Soft, nontender, nondistended, positive bowel sounds. Extremities: No clubbing cyanosis or edema with positive pedal pulses. Neuro: Grossly intact, nonfocal.    Lab Results: Basic Metabolic Panel:  Basename 06/26/11 0630 06/25/11 0501  NA 137 138  K 3.7 3.3*  CL 97 97  CO2 30 30  GLUCOSE 177* 159*  BUN 9 13  CREATININE 0.82 0.90  CALCIUM 9.7 9.4  MG -- --  PHOS -- --   Liver Function Tests:  Basename 06/26/11 0630  AST 34  ALT 39*  ALKPHOS 80  BILITOT 0.4  PROT 7.5  ALBUMIN 3.4*   No results found for this basename: LIPASE:2,AMYLASE:2 in the last 72 hours No results found for this basename: AMMONIA:2 in the last 72 hours CBC:  Basename 06/26/11 0630 06/25/11 0501 06/24/11 1340  WBC 9.8 9.9 --  NEUTROABS -- -- 6.0  HGB 13.9 12.9 --  HCT 43.2 40.8 --  MCV 85.4 85.7 --  PLT 305 298 --   Cardiac Enzymes: No results found for this basename: CKTOTAL:3,CKMB:3,CKMBINDEX:3,TROPONINI:3 in the last 72 hours BNP: No results found for this basename: PROBNP:3 in the last 72 hours D-Dimer: No results found for this basename: DDIMER:2 in the last 72 hours CBG:  Basename 06/27/11 0736 06/26/11 2052 06/26/11 1702 06/26/11  1155 06/26/11 0749 06/25/11 2101  GLUCAP 173* 153* 122* 148* 162* 144*   Hemoglobin A1C: No results found for this basename: HGBA1C in the last 72 hours Fasting Lipid Panel: No results found for this basename: CHOL,HDL,LDLCALC,TRIG,CHOLHDL,LDLDIRECT in the last 72 hours Thyroid Function Tests: No results found for this basename: TSH,T4TOTAL,FREET4,T3FREE,THYROIDAB in the last 72 hours Anemia Panel: No results found for this basename: VITAMINB12,FOLATE,FERRITIN,TIBC,IRON,RETICCTPCT in the last 72 hours Coagulation: No results found for this basename: LABPROT:2,INR:2 in the last 72 hours Urine Drug Screen: Drugs of Abuse  No results found for this basename: labopia, cocainscrnur, labbenz, amphetmu, thcu, labbarb    Alcohol Level: No results found for this basename: ETH:2 in the last 72 hours Urinalysis: No results found for this basename: COLORURINE:2,APPERANCEUR:2,LABSPEC:2,PHURINE:2,GLUCOSEU:2,HGBUR:2,BILIRUBINUR:2,KETONESUR:2,PROTEINUR:2,UROBILINOGEN:2,NITRITE:2,LEUKOCYTESUR:2 in the last 72 hours  No results found for this or any previous visit (from the past 240 hour(s)).  Studies/Results: US Renal  06/25/2011  *RADIOLOGY REPORT*  Clinical Data: A renal lesions seen on CT scan  RENAL/URINARY TRACT ULTRASOUND COMPLETE  Comparison:  CT scan dated Jun 24, 2011  Findings:  Right Kidney:  There is a 5.6 cm lateral exophytic simple cyst at the midportion.  There is also a predominantly solid mass at the anterior midportion that measures 2.7 cm.  Overall, the size of the right kidney is normal, measuring 11.3 cm in length.  There is no hydronephrosis.  Left Kidney:  Normal in size and echotexture, measuring 11.6 cm.  There is no evidence of mass or hydronephrosis.  Bladder:  Normal for degree of distention.  IMPRESSION: There is a 2.7 cm predominantly solid mass at the anterior midportion of the right kidney.  This is worrisome for neoplasm. Abdominal MRI with contrast is recommended for better  characterization.  This was made a call report.  Original Report Authenticated By: Brandon Melnick, M.D.   Dg Bone Survey Met  06/25/2011  *RADIOLOGY REPORT*  Clinical Data:  Lytic lesion right ilium question myeloma or lytic metastatic disease  METASTATIC BONE SURVEY  Comparison: Thoracic and lumbar spine radiographs 06/24/2011 Correlation:  CT abdomen pelvis 06/24/2011  Findings: Retained contrast within colon from preceding CT obscures portions of pelvis/spine. The lytic focus within the right ilium on the preceding CT is not definitely visualized on radiographs. Osseous mineralization appears grossly normal. Scattered end plate spur formation cervical, thoracic and lumbar spine. Surgical clips right upper quadrant question cholecystectomy. Prior right knee replacement. Osteoarthritic changes left knee. No definite lytic or destructive lesions are identified.  IMPRESSION: Multilevel degenerative disc disease changes of the cervical, thoracic, and lumbar spine. Post right knee replacement. Osteoarthritic changes left knee. No definite lytic or destructive foci are identified within the visualized osseous structures.  Original Report Authenticated By: Lollie Marrow, M.D.    Medications: Scheduled Meds:   . amLODipine  10 mg Oral Daily  . chlorthalidone  25 mg Oral Daily  . cloNIDine  0.2 mg Transdermal Weekly  . divalproex  500 mg Oral QHS  . fluPHENAZine  5 mg Oral QHS  . furosemide  20 mg Oral Daily  . hydrocortisone  25 mg Rectal BID  . insulin aspart  0-15 Units Subcutaneous TID WC  . insulin aspart  0-5 Units Subcutaneous QHS  . lisinopril  20 mg Oral Daily  . losartan  50 mg Oral Daily  . mulitivitamin with minerals  1 tablet Oral Daily  . pantoprazole  40 mg Oral Daily  . potassium chloride  40 mEq Oral Daily  . traZODone  200 mg Oral QHS  . ziprasidone  40 mg Oral QHS   Continuous Infusions:  PRN Meds:.acetaminophen, acetaminophen, cloNIDine, ondansetron (ZOFRAN) IV, ondansetron,  traMADol  Assessment/Plan:  Principal Problem:  *Rectal bleeding Active Problems:  Hypertension  Schizophrenia  Diabetes mellitus  Hypokalemia  Lytic bone lesion of hip  Renal mass  Plan:  This lady was admitted with blood per rectum.  Work up in the hospital revealed a lytic lesion in the right hip as well as a right renal mass.   1.Rectal bleeding. Likely hemorrhoidal.  Patient on anusol suppositories.  Her hemoglobin has remained stable.  We can likely pursue an outpatient colonoscopy.  GI saw the patient during this admission.  2. Lytic lesion in right hip/Renal mass.  Myeloma work up is pending.  MRI of abdomen ordered to further characterize the renal mass but patient refused.  Oncology and urology consults also pending.  3. Schizophrenia. Patient has been refusing multiple treatments here in the hospital including MRI/CT and PICC line. Currently she is requesting to discharge home.  I explained to her that there is a mass on her right kidney, and there is a possibility that this may be cancer.  She is refusing further work up and wants to go home.  She trusts that the [facility] will take care of her and since this mass is not bother her, she will not bother with it.  I question her capacity to make informed decisions.  I have ordered a telepsychiatry consult for determination of capacity and med management.  If it is felt that the patient does have capacity, then we will have to discharge her and pursue further work up as an outpatient.   If she does not have capacity and requires stabilization of her mental illness, then this will take precedence over work up of her mass.  She will need to be of sound mind and able to make consistent decisions before we can effectively pursue further work up.   LOS: 3 days   Daylon Lafavor [facility] Pager: [phone] 06/27/2011, 10:56 AM  Patient was evaluated by tele psychiatry and felt that the patient lacked capacity to make decisions and  recommended involuntary commitment and transfer to an inpatient psychiatric facility for mental stabilization.  IVC papers have been filled out and ACT team has been consulted. Currently the patient is very aggressive, agitated and wanting to leave.  Security and police are on site.  Medically, her bleeding has resolved and her hemoglobin is stable. She may need outpatient colonoscopy but this is not an urgent matter.  Regarding her renal mass, this can be further worked up, once her mental state has stabilized.  She is medically stable for transfer for further psychiatric care.  I have tried calling the patient's son to update him and have left voicemails.  Await his call back.
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
## 2011-06-27 NOTE — Consult Note (Signed)
[phone]

## 2011-06-28 DIAGNOSIS — K625 Hemorrhage of anus and rectum: Secondary | ICD-10-CM

## 2011-06-28 DIAGNOSIS — F209 Schizophrenia, unspecified: Secondary | ICD-10-CM

## 2011-06-28 DIAGNOSIS — I10 Essential (primary) hypertension: Secondary | ICD-10-CM

## 2011-06-28 LAB — GLUCOSE, CAPILLARY

## 2011-06-28 NOTE — BH Assessment (Signed)
Assessment Note   Spoke with Brett Canales at [facility] who confirmed that the fax was received and that it was being reviewed by medical.    [name] [name] 06/28/2011 8:12 PM
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
## 2011-06-29 DIAGNOSIS — I10 Essential (primary) hypertension: Secondary | ICD-10-CM

## 2011-06-29 DIAGNOSIS — F209 Schizophrenia, unspecified: Secondary | ICD-10-CM

## 2011-06-29 LAB — UIFE/LIGHT CHAINS/TP QN, 24-HR UR
Alpha 1, Urine: NOT DETECTED
Alpha 2, Urine: NOT DETECTED
Beta, Urine: NOT DETECTED
Free Kappa Lt Chains,Ur: 0.14 mg/dL (ref 0.14–2.42)
Free Kappa/Lambda Ratio: 1 ratio — ABNORMAL LOW (ref 2.04–10.37)
Gamma Globulin, Urine: NOT DETECTED

## 2011-06-29 LAB — GLUCOSE, CAPILLARY
Glucose-Capillary: 210 mg/dL — ABNORMAL HIGH (ref 70–99)
Glucose-Capillary: 223 mg/dL — ABNORMAL HIGH (ref 70–99)

## 2011-06-29 MED ORDER — GUAIFENESIN-CODEINE 100-10 MG/5ML PO SOLN
10.0000 mL | ORAL | Status: DC | PRN
  Administered 2011-06-29: 10 mL via ORAL
  Filled 2011-06-29: qty 10

## 2011-06-29 NOTE — BH Assessment (Signed)
Assessment Note   Spoke with Melanie P. @ [facility] who gave 1 day coutesy authorization due to patient physically being in there catchment area. Auth # is [id number].      Rudi Coco 06/29/2011 2:52 PM
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
## 2011-06-29 NOTE — Progress Notes (Signed)
Patient still refusing to take medications. States she is sleeping enough & doesn't need these meds.

## 2011-06-29 NOTE — Progress Notes (Signed)
Subjective: This lady has a dry cough. There is no fever. She is still awaiting discharge. [name] [name] inpatient psychiatric unit because her admission. The reason for this is not entirely clear. The patient still maintains that she does not want any investigation for her probable kidney cancer. She states "God will take care of this".           Physical Exam: Blood pressure 117/88, pulse 94, temperature 98.8 F (37.1 C), temperature source Oral, resp. rate 16, height 5\' 7"  (1.702 m), weight 124.4 kg (274 lb 4 oz), SpO2 94.00%. She is obese. She looks systemically well. Is not toxic or septic. Lung fields are entirely clear. Heart sounds are present and normal. She is alert and orientated. She is clearly delusional.          Medications:  Scheduled:   . amLODipine  10 mg Oral Daily  . chlorthalidone  25 mg Oral Daily  . cloNIDine  0.2 mg Transdermal Weekly  . divalproex  500 mg Oral QHS  . fluPHENAZine  5 mg Oral QHS  . furosemide  20 mg Oral Daily  . hydrocortisone  25 mg Rectal BID  . insulin aspart  0-15 Units Subcutaneous TID WC  . insulin aspart  0-5 Units Subcutaneous QHS  . lisinopril  20 mg Oral Daily  . losartan  50 mg Oral Daily  . mulitivitamin with minerals  1 tablet Oral Daily  . pantoprazole  40 mg Oral Daily  . potassium chloride  40 mEq Oral Daily  . traZODone  200 mg Oral QHS  . ziprasidone  40 mg Oral QHS    Impression: 1. Schizophrenia. 2. Dry cough, likely secondary to possible viral pharyngitis. 3. Hypertension. 4. Type 2 diabetes mellitus. 5. Left renal mass, likely renal cell cancer. Patient declining further investigations or surgery. Patient has been seen by urology.     Plan: 1. Patient is medically stable for discharge to inpatient psychiatric unit.     LOS: 5 days   Wilson Singer Pager [phone]  06/29/2011, 9:33 AM

## 2011-06-29 NOTE — Progress Notes (Signed)
Pt refused CBG at dinner time 1700.

## 2011-06-29 NOTE — Care Management (Signed)
Patient refuses to wear cpap; order has been discontinued.

## 2011-06-30 DIAGNOSIS — I10 Essential (primary) hypertension: Secondary | ICD-10-CM

## 2011-06-30 DIAGNOSIS — F209 Schizophrenia, unspecified: Secondary | ICD-10-CM

## 2011-06-30 DIAGNOSIS — K625 Hemorrhage of anus and rectum: Secondary | ICD-10-CM

## 2011-06-30 LAB — IMMUNOFIXATION ELECTROPHORESIS
IgA: 377 mg/dL (ref 69–380)
IgM, Serum: 190 mg/dL (ref 52–322)

## 2011-06-30 LAB — GLUCOSE, CAPILLARY: Glucose-Capillary: 198 mg/dL — ABNORMAL HIGH (ref 70–99)
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
## 2011-06-30 NOTE — Progress Notes (Signed)
Pt is agitated and refuses to take scheduled medications I have offered her a drug reference book and to see each medication packet but pt has refused all efforts by me to make her feel comfortable taking medications.  Dr Karilyn Cota is aware via text message
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
## 2011-07-16 ENCOUNTER — Emergency Department: Payer: Self-pay | Admitting: Emergency Medicine

## 2011-07-16 LAB — CBC
HGB: 14.4 g/dL (ref 12.0–16.0)
MCHC: 33.1 g/dL (ref 32.0–36.0)
MCV: 85 fL (ref 80–100)
Platelet: 296 10*3/uL (ref 150–440)
RDW: 13.6 % (ref 11.5–14.5)
WBC: 13.6 10*3/uL — ABNORMAL HIGH (ref 3.6–11.0)

## 2011-07-16 LAB — URINALYSIS, COMPLETE
Bilirubin,UR: NEGATIVE
Blood: NEGATIVE
Hyaline Cast: 1
Ketone: NEGATIVE
Nitrite: NEGATIVE
Specific Gravity: 1.011 (ref 1.003–1.030)
WBC UR: 11 /HPF (ref 0–5)

## 2011-07-16 LAB — COMPREHENSIVE METABOLIC PANEL
Albumin: 3.8 g/dL (ref 3.4–5.0)
Alkaline Phosphatase: 82 U/L (ref 50–136)
Anion Gap: 9 (ref 7–16)
BUN: 22 mg/dL — ABNORMAL HIGH (ref 7–18)
Calcium, Total: 9.6 mg/dL (ref 8.5–10.1)
Chloride: 97 mmol/L — ABNORMAL LOW (ref 98–107)
Co2: 31 mmol/L (ref 21–32)
EGFR (African American): 50 — ABNORMAL LOW
EGFR (Non-African Amer.): 44 — ABNORMAL LOW
Osmolality: 280 (ref 275–301)
Potassium: 4.3 mmol/L (ref 3.5–5.1)
SGPT (ALT): 51 U/L
Sodium: 137 mmol/L (ref 136–145)
Total Protein: 8.9 g/dL — ABNORMAL HIGH (ref 6.4–8.2)

## 2011-09-16 ENCOUNTER — Inpatient Hospital Stay: Payer: Self-pay | Admitting: Specialist

## 2011-09-16 LAB — BASIC METABOLIC PANEL
Anion Gap: 8 (ref 7–16)
BUN: 16 mg/dL (ref 7–18)
Chloride: 100 mmol/L (ref 98–107)
Co2: 32 mmol/L (ref 21–32)
Creatinine: 1.39 mg/dL — ABNORMAL HIGH (ref 0.60–1.30)
Potassium: 3.5 mmol/L (ref 3.5–5.1)
Sodium: 140 mmol/L (ref 136–145)

## 2011-09-16 LAB — CBC
HCT: 38.3 % (ref 35.0–47.0)
HGB: 12 g/dL (ref 12.0–16.0)
MCHC: 31.4 g/dL — ABNORMAL LOW (ref 32.0–36.0)

## 2011-09-16 LAB — HEPATIC FUNCTION PANEL A (ARMC)
Albumin: 3.4 g/dL (ref 3.4–5.0)
Bilirubin, Direct: <0.05 mg/dL (ref 0.00–0.20)
SGOT(AST): 50 U/L — ABNORMAL HIGH (ref 15–37)

## 2011-09-16 LAB — CK-MB
CK-MB: 1.2 ng/mL (ref 0.5–3.6)
CK-MB: 1.7 ng/mL (ref 0.5–3.6)

## 2011-09-16 LAB — CK: CK, Total: 407 U/L — ABNORMAL HIGH (ref 21–215)

## 2011-09-16 LAB — CK TOTAL AND CKMB (NOT AT ARMC): CK, Total: 401 U/L — ABNORMAL HIGH (ref 21–215)

## 2011-09-17 LAB — BASIC METABOLIC PANEL
Anion Gap: 7 (ref 7–16)
Calcium, Total: 9.3 mg/dL (ref 8.5–10.1)
EGFR (African American): 57 — ABNORMAL LOW
EGFR (Non-African Amer.): 50 — ABNORMAL LOW
Glucose: 127 mg/dL — ABNORMAL HIGH (ref 65–99)
Osmolality: 288 (ref 275–301)
Potassium: 3.6 mmol/L (ref 3.5–5.1)

## 2011-09-17 LAB — CK-MB: CK-MB: 1.3 ng/mL (ref 0.5–3.6)

## 2011-11-10 ENCOUNTER — Emergency Department: Payer: Self-pay | Admitting: Emergency Medicine

## 2011-12-02 ENCOUNTER — Emergency Department: Payer: Self-pay | Admitting: Emergency Medicine

## 2011-12-02 LAB — COMPREHENSIVE METABOLIC PANEL
Anion Gap: 11 (ref 7–16)
BUN: 9 mg/dL (ref 7–18)
Chloride: 101 mmol/L (ref 98–107)
EGFR (African American): >60
EGFR (Non-African Amer.): >60
Osmolality: 277 (ref 275–301)
Potassium: 4.4 mmol/L (ref 3.5–5.1)
SGOT(AST): 53 U/L — ABNORMAL HIGH (ref 15–37)
Sodium: 137 mmol/L (ref 136–145)
Total Protein: 9 g/dL — ABNORMAL HIGH (ref 6.4–8.2)

## 2011-12-02 LAB — URINALYSIS, COMPLETE
Bilirubin,UR: NEGATIVE
Glucose,UR: NEGATIVE mg/dL (ref 0–75)
Nitrite: NEGATIVE
Protein: NEGATIVE
RBC,UR: 1 /HPF (ref 0–5)
WBC UR: 1 /HPF (ref 0–5)

## 2011-12-02 LAB — CBC
HCT: 45.3 % (ref 35.0–47.0)
HGB: 14.5 g/dL (ref 12.0–16.0)
MCH: 27.6 pg (ref 26.0–34.0)
MCHC: 32.1 g/dL (ref 32.0–36.0)

## 2011-12-02 LAB — LIPASE, BLOOD: Lipase: 74 U/L (ref 73–393)

## 2012-06-08 LAB — ACETAMINOPHEN LEVEL: Acetaminophen: <2 ug/mL

## 2012-06-08 LAB — DRUG SCREEN, URINE
Barbiturates, Ur Screen: NEGATIVE (ref ?–200)
Benzodiazepine, Ur Scrn: NEGATIVE (ref ?–200)
Cannabinoid 50 Ng, Ur ~~LOC~~: NEGATIVE (ref ?–50)
Cocaine Metabolite,Ur ~~LOC~~: NEGATIVE (ref ?–300)
MDMA (Ecstasy)Ur Screen: NEGATIVE (ref ?–500)
Methadone, Ur Screen: NEGATIVE (ref ?–300)
Opiate, Ur Screen: NEGATIVE (ref ?–300)
Phencyclidine (PCP) Ur S: NEGATIVE (ref ?–25)
Tricyclic, Ur Screen: NEGATIVE (ref ?–1000)

## 2012-06-08 LAB — COMPREHENSIVE METABOLIC PANEL
Albumin: 3.7 g/dL (ref 3.4–5.0)
Alkaline Phosphatase: 105 U/L (ref 50–136)
Anion Gap: 8 (ref 7–16)
BUN: 7 mg/dL (ref 7–18)
Bilirubin,Total: 0.6 mg/dL (ref 0.2–1.0)
Chloride: 103 mmol/L (ref 98–107)
Creatinine: 1.1 mg/dL (ref 0.60–1.30)
EGFR (African American): >60
Glucose: 130 mg/dL — ABNORMAL HIGH (ref 65–99)
Osmolality: 272 (ref 275–301)
SGOT(AST): 56 U/L — ABNORMAL HIGH (ref 15–37)
SGPT (ALT): 52 U/L (ref 12–78)
Sodium: 136 mmol/L (ref 136–145)

## 2012-06-08 LAB — CBC
HGB: 15.5 g/dL (ref 12.0–16.0)
MCH: 28.2 pg (ref 26.0–34.0)
MCHC: 33.2 g/dL (ref 32.0–36.0)
MCV: 85 fL (ref 80–100)
Platelet: 275 10*3/uL (ref 150–440)
RBC: 5.49 10*6/uL — ABNORMAL HIGH (ref 3.80–5.20)
RDW: 13.7 % (ref 11.5–14.5)
WBC: 9.9 10*3/uL (ref 3.6–11.0)

## 2012-06-08 LAB — URINALYSIS, COMPLETE
Bilirubin,UR: NEGATIVE
Ketone: NEGATIVE
Leukocyte Esterase: NEGATIVE
Nitrite: NEGATIVE
Ph: 6 (ref 4.5–8.0)
Protein: NEGATIVE
RBC,UR: <1 /HPF (ref 0–5)
Specific Gravity: 1.01 (ref 1.003–1.030)
Squamous Epithelial: 2
WBC UR: 1 /HPF (ref 0–5)

## 2012-06-08 LAB — ETHANOL
Ethanol %: <0.003 % (ref 0.000–0.080)
Ethanol: <3 mg/dL

## 2012-06-08 LAB — SALICYLATE LEVEL: Salicylates, Serum: <1.7 mg/dL

## 2012-06-09 ENCOUNTER — Inpatient Hospital Stay: Payer: Self-pay | Admitting: Psychiatry

## 2012-06-09 LAB — HEMOGLOBIN A1C: Hemoglobin A1C: 9.1 % — ABNORMAL HIGH (ref 4.2–6.3)

## 2012-09-24 ENCOUNTER — Emergency Department: Payer: Self-pay | Admitting: Emergency Medicine

## 2012-09-24 LAB — BASIC METABOLIC PANEL
Anion Gap: 8 (ref 7–16)
BUN: 17 mg/dL (ref 7–18)
Calcium, Total: 9.3 mg/dL (ref 8.5–10.1)
Chloride: 96 mmol/L — ABNORMAL LOW (ref 98–107)
Co2: 31 mmol/L (ref 21–32)
Creatinine: 1.34 mg/dL — ABNORMAL HIGH (ref 0.60–1.30)
EGFR (Non-African Amer.): 41 — ABNORMAL LOW
Potassium: 2.7 mmol/L — ABNORMAL LOW (ref 3.5–5.1)
Sodium: 135 mmol/L — ABNORMAL LOW (ref 136–145)

## 2012-09-24 LAB — CBC
HCT: 42.8 % (ref 35.0–47.0)
MCV: 83 fL (ref 80–100)
RBC: 5.17 10*6/uL (ref 3.80–5.20)
RDW: 13.5 % (ref 11.5–14.5)
WBC: 11.3 10*3/uL — ABNORMAL HIGH (ref 3.6–11.0)

## 2012-09-25 LAB — TROPONIN I: Troponin-I: 0.03 ng/mL

## 2012-11-28 LAB — CBC
HCT: 44.1 % (ref 35.0–47.0)
HGB: 14.7 g/dL (ref 12.0–16.0)
MCHC: 33.3 g/dL (ref 32.0–36.0)
MCV: 84 fL (ref 80–100)
Platelet: 296 10*3/uL (ref 150–440)
WBC: 10.6 10*3/uL (ref 3.6–11.0)

## 2012-11-28 LAB — COMPREHENSIVE METABOLIC PANEL
Albumin: 3.6 g/dL (ref 3.4–5.0)
Alkaline Phosphatase: 106 U/L (ref 50–136)
BUN: 10 mg/dL (ref 7–18)
Chloride: 105 mmol/L (ref 98–107)
Co2: 27 mmol/L (ref 21–32)
EGFR (African American): 49 — ABNORMAL LOW
Osmolality: 279 (ref 275–301)
Potassium: 3.5 mmol/L (ref 3.5–5.1)
SGPT (ALT): 66 U/L (ref 12–78)
Sodium: 139 mmol/L (ref 136–145)

## 2012-11-28 LAB — ACETAMINOPHEN LEVEL: Acetaminophen: <2 ug/mL

## 2012-11-28 LAB — DRUG SCREEN, URINE
Amphetamines, Ur Screen: NEGATIVE (ref ?–1000)
Cannabinoid 50 Ng, Ur ~~LOC~~: NEGATIVE (ref ?–50)
MDMA (Ecstasy)Ur Screen: NEGATIVE (ref ?–500)
Methadone, Ur Screen: NEGATIVE (ref ?–300)
Opiate, Ur Screen: NEGATIVE (ref ?–300)
Phencyclidine (PCP) Ur S: NEGATIVE (ref ?–25)

## 2012-11-28 LAB — ETHANOL
Ethanol %: <0.003 % (ref 0.000–0.080)
Ethanol: <3 mg/dL

## 2012-11-28 LAB — SALICYLATE LEVEL: Salicylates, Serum: <1.7 mg/dL

## 2012-12-02 LAB — CBC
HGB: 14 g/dL (ref 12.0–16.0)
MCHC: 33.5 g/dL (ref 32.0–36.0)
MCV: 84 fL (ref 80–100)
RDW: 13.8 % (ref 11.5–14.5)
WBC: 8.9 10*3/uL (ref 3.6–11.0)

## 2012-12-02 LAB — COMPREHENSIVE METABOLIC PANEL
Albumin: 3 g/dL — ABNORMAL LOW (ref 3.4–5.0)
Alkaline Phosphatase: 79 U/L (ref 50–136)
Anion Gap: 5 — ABNORMAL LOW (ref 7–16)
BUN: 19 mg/dL — ABNORMAL HIGH (ref 7–18)
Bilirubin,Total: 0.3 mg/dL (ref 0.2–1.0)
Calcium, Total: 8.9 mg/dL (ref 8.5–10.1)
Co2: 32 mmol/L (ref 21–32)
Creatinine: 1.11 mg/dL (ref 0.60–1.30)
EGFR (African American): 60 — ABNORMAL LOW
EGFR (Non-African Amer.): 51 — ABNORMAL LOW
Glucose: 104 mg/dL — ABNORMAL HIGH (ref 65–99)
SGOT(AST): 41 U/L — ABNORMAL HIGH (ref 15–37)
SGPT (ALT): 45 U/L (ref 12–78)
Total Protein: 7.1 g/dL (ref 6.4–8.2)

## 2012-12-02 LAB — VALPROIC ACID LEVEL: Valproic Acid: 89 ug/mL

## 2012-12-05 ENCOUNTER — Inpatient Hospital Stay: Payer: Self-pay | Admitting: Psychiatry

## 2012-12-06 LAB — VALPROIC ACID LEVEL: Valproic Acid: 93 ug/mL

## 2012-12-15 LAB — COMPREHENSIVE METABOLIC PANEL
Alkaline Phosphatase: 91 U/L (ref 50–136)
BUN: 13 mg/dL (ref 7–18)
Bilirubin,Total: 0.3 mg/dL (ref 0.2–1.0)
Calcium, Total: 9.3 mg/dL (ref 8.5–10.1)
EGFR (African American): 58 — ABNORMAL LOW
EGFR (Non-African Amer.): 50 — ABNORMAL LOW
Glucose: 101 mg/dL — ABNORMAL HIGH (ref 65–99)
Potassium: 4 mmol/L (ref 3.5–5.1)

## 2012-12-15 LAB — CBC
MCH: 28.2 pg (ref 26.0–34.0)
MCHC: 33 g/dL (ref 32.0–36.0)
MCV: 85 fL (ref 80–100)
Platelet: 217 10*3/uL (ref 150–440)
RDW: 13.9 % (ref 11.5–14.5)

## 2012-12-15 LAB — VALPROIC ACID LEVEL: Valproic Acid: 57 ug/mL

## 2012-12-16 LAB — URINALYSIS, COMPLETE
Bilirubin,UR: NEGATIVE
Blood: NEGATIVE
Glucose,UR: NEGATIVE mg/dL (ref 0–75)
Nitrite: NEGATIVE
Ph: 6 (ref 4.5–8.0)
Specific Gravity: 1.012 (ref 1.003–1.030)
Squamous Epithelial: 1
WBC UR: 2 /HPF (ref 0–5)

## 2012-12-17 ENCOUNTER — Inpatient Hospital Stay: Payer: Self-pay | Admitting: Internal Medicine

## 2012-12-17 LAB — CBC WITH DIFFERENTIAL/PLATELET
Eosinophil #: 0.1 10*3/uL (ref 0.0–0.7)
Eosinophil %: 1.4 %
HCT: 37.1 % (ref 35.0–47.0)
Lymphocyte #: 2.3 10*3/uL (ref 1.0–3.6)
MCH: 28.2 pg (ref 26.0–34.0)
MCHC: 32.8 g/dL (ref 32.0–36.0)
Monocyte #: 1 x10 3/mm — ABNORMAL HIGH (ref 0.2–0.9)
Monocyte %: 13.7 %
Neutrophil #: 4.1 10*3/uL (ref 1.4–6.5)
Neutrophil %: 54.3 %
Platelet: 223 10*3/uL (ref 150–440)
RBC: 4.32 10*6/uL (ref 3.80–5.20)
RDW: 14.5 % (ref 11.5–14.5)

## 2012-12-17 LAB — COMPREHENSIVE METABOLIC PANEL
Albumin: 2.9 g/dL — ABNORMAL LOW (ref 3.4–5.0)
BUN: 14 mg/dL (ref 7–18)
Bilirubin,Total: 0.2 mg/dL (ref 0.2–1.0)
Calcium, Total: 8.3 mg/dL — ABNORMAL LOW (ref 8.5–10.1)
Co2: 34 mmol/L — ABNORMAL HIGH (ref 21–32)
Creatinine: 1.06 mg/dL (ref 0.60–1.30)
EGFR (African American): >60
Osmolality: 286 (ref 275–301)

## 2012-12-18 ENCOUNTER — Inpatient Hospital Stay: Payer: Self-pay | Admitting: Psychiatry

## 2012-12-18 LAB — URINALYSIS, COMPLETE
Glucose,UR: NEGATIVE mg/dL (ref 0–75)
Ketone: NEGATIVE
Leukocyte Esterase: NEGATIVE
Nitrite: NEGATIVE
Ph: 7 (ref 4.5–8.0)
Protein: NEGATIVE
RBC,UR: <1 /HPF (ref 0–5)
Squamous Epithelial: 1
WBC UR: <1 /HPF (ref 0–5)

## 2012-12-19 LAB — URINE CULTURE

## 2012-12-20 LAB — BASIC METABOLIC PANEL
Anion Gap: 1 — ABNORMAL LOW (ref 7–16)
BUN: 14 mg/dL (ref 7–18)
Calcium, Total: 9.1 mg/dL (ref 8.5–10.1)
Creatinine: 1.03 mg/dL (ref 0.60–1.30)
EGFR (Non-African Amer.): 56 — ABNORMAL LOW
Glucose: 151 mg/dL — ABNORMAL HIGH (ref 65–99)
Potassium: 4.1 mmol/L (ref 3.5–5.1)
Sodium: 138 mmol/L (ref 136–145)

## 2012-12-20 LAB — VALPROIC ACID LEVEL: Valproic Acid: 51 ug/mL

## 2013-01-01 LAB — BASIC METABOLIC PANEL
Anion Gap: 2 — ABNORMAL LOW (ref 7–16)
Creatinine: 1.09 mg/dL (ref 0.60–1.30)
EGFR (African American): >60
EGFR (Non-African Amer.): 52 — ABNORMAL LOW
Glucose: 152 mg/dL — ABNORMAL HIGH (ref 65–99)
Osmolality: 287 (ref 275–301)
Potassium: 4.8 mmol/L (ref 3.5–5.1)

## 2013-01-03 LAB — AMMONIA: Ammonia, Plasma: 43 mcmol/L — ABNORMAL HIGH (ref 11–32)

## 2013-03-09 DIAGNOSIS — Z86010 Personal history of colonic polyps: Secondary | ICD-10-CM | POA: Insufficient documentation

## 2013-09-22 ENCOUNTER — Emergency Department: Payer: Self-pay | Admitting: Emergency Medicine

## 2013-09-22 LAB — URINALYSIS, COMPLETE
BACTERIA: NONE SEEN
Bilirubin,UR: NEGATIVE
Bilirubin,UR: NEGATIVE
Glucose,UR: >=500 mg/dL (ref 0–75)
Glucose,UR: 50 mg/dL (ref 0–75)
KETONE: NEGATIVE
Ketone: NEGATIVE
Leukocyte Esterase: NEGATIVE
Nitrite: NEGATIVE
Nitrite: NEGATIVE
PROTEIN: NEGATIVE
PROTEIN: NEGATIVE
Ph: 5 (ref 4.5–8.0)
Ph: 6 (ref 4.5–8.0)
RBC,UR: <1 /HPF (ref 0–5)
SPECIFIC GRAVITY: 1.003 (ref 1.003–1.030)
Specific Gravity: 1.006 (ref 1.003–1.030)
Squamous Epithelial: 8
WBC UR: 25 /HPF (ref 0–5)
WBC UR: <1 /HPF (ref 0–5)

## 2013-09-22 LAB — CBC WITH DIFFERENTIAL/PLATELET
BASOS PCT: 3.1 %
Basophil #: 0.4 10*3/uL — ABNORMAL HIGH (ref 0.0–0.1)
Eosinophil #: 0.1 10*3/uL (ref 0.0–0.7)
Eosinophil %: 1.1 %
HCT: 43.5 % (ref 35.0–47.0)
HGB: 13.9 g/dL (ref 12.0–16.0)
LYMPHS PCT: 26.1 %
Lymphocyte #: 3.2 10*3/uL (ref 1.0–3.6)
MCH: 27.9 pg (ref 26.0–34.0)
MCHC: 31.9 g/dL — ABNORMAL LOW (ref 32.0–36.0)
MCV: 87 fL (ref 80–100)
MONO ABS: 1.1 x10 3/mm — AB (ref 0.2–0.9)
MONOS PCT: 8.9 %
NEUTROS PCT: 60.8 %
Neutrophil #: 7.4 10*3/uL — ABNORMAL HIGH (ref 1.4–6.5)
Platelet: 275 10*3/uL (ref 150–440)
RBC: 4.98 10*6/uL (ref 3.80–5.20)
RDW: 13.2 % (ref 11.5–14.5)
WBC: 12.1 10*3/uL — ABNORMAL HIGH (ref 3.6–11.0)

## 2013-09-22 LAB — COMPREHENSIVE METABOLIC PANEL
Albumin: 3.4 g/dL (ref 3.4–5.0)
Alkaline Phosphatase: 83 U/L
Anion Gap: 7 (ref 7–16)
BUN: 11 mg/dL (ref 7–18)
Bilirubin,Total: 0.2 mg/dL (ref 0.2–1.0)
CHLORIDE: 98 mmol/L (ref 98–107)
CO2: 29 mmol/L (ref 21–32)
Calcium, Total: 9 mg/dL (ref 8.5–10.1)
Creatinine: 1.11 mg/dL (ref 0.60–1.30)
GFR CALC AF AMER: 59 — AB
GFR CALC NON AF AMER: 51 — AB
Glucose: 238 mg/dL — ABNORMAL HIGH (ref 65–99)
Osmolality: 275 (ref 275–301)
POTASSIUM: 3.8 mmol/L (ref 3.5–5.1)
SGOT(AST): 27 U/L (ref 15–37)
SGPT (ALT): 24 U/L
Sodium: 134 mmol/L — ABNORMAL LOW (ref 136–145)
Total Protein: 8.7 g/dL — ABNORMAL HIGH (ref 6.4–8.2)

## 2013-09-22 LAB — LIPASE, BLOOD: Lipase: 739 U/L — ABNORMAL HIGH (ref 73–393)

## 2013-10-03 ENCOUNTER — Other Ambulatory Visit: Payer: Self-pay | Admitting: *Deleted

## 2013-10-03 ENCOUNTER — Encounter: Payer: Self-pay | Admitting: *Deleted

## 2013-10-03 ENCOUNTER — Ambulatory Visit (INDEPENDENT_AMBULATORY_CARE_PROVIDER_SITE_OTHER): Payer: Medicare HMO | Admitting: Podiatry

## 2013-10-03 ENCOUNTER — Ambulatory Visit (INDEPENDENT_AMBULATORY_CARE_PROVIDER_SITE_OTHER): Payer: Medicare HMO

## 2013-10-03 ENCOUNTER — Encounter: Payer: Self-pay | Admitting: Podiatry

## 2013-10-03 VITALS — BP 140/90 | HR 83 | Resp 16 | Ht 66.0 in | Wt 285.0 lb

## 2013-10-03 DIAGNOSIS — M79676 Pain in unspecified toe(s): Secondary | ICD-10-CM

## 2013-10-03 DIAGNOSIS — Q6650 Congenital pes planus, unspecified foot: Secondary | ICD-10-CM

## 2013-10-03 DIAGNOSIS — E119 Type 2 diabetes mellitus without complications: Secondary | ICD-10-CM

## 2013-10-03 DIAGNOSIS — B351 Tinea unguium: Secondary | ICD-10-CM

## 2013-10-03 DIAGNOSIS — M79609 Pain in unspecified limb: Secondary | ICD-10-CM

## 2013-10-03 NOTE — Progress Notes (Signed)
   Subjective:    Patient ID: WILLYE JAVIER, female    DOB: 07/24/1945, 68 y.o.   MRN: [id number]  HPI Comments: Diabetic foot care, need my toenails clipped and feet checked, been diabetic for 6 years now , last know blood sugar was 85. It has been running good  Diabetes      Review of Systems  Endocrine:       Diabetes        Objective:   Physical Exam: I have reviewed past medical history medications allergies surgeries social history and review of systems. Pulses are strongly palpable bilateral. Neurologic sensorium is intact per Semmes-Weinstein monofilament. Deep tendon reflexes are intact bilateral and brisk. Muscle strength is 5 over 5 dorsiflexors plantar flexors inverters everters all intrinsic musculature is intact. Cutaneous of admission demonstrates supple well hydrated cutis nails are thick yellow dystrophic onychomycotic bilateral. Orthopedic evaluation demonstrates all joints distal to the ankle a full range of motion without crepitus with exception of pes planus bilateral.        Assessment & Plan:  Assessment: Pes planus bilateral. No complicated. Pain in limb secondary to onychomycosis 1 through 5 bilateral.  Plan: Debridement of nails 1 through 5 bilateral covered service secondary to pain.

## 2013-10-03 NOTE — Patient Instructions (Signed)
Diabetes and Foot Care Diabetes may cause you to have problems because of poor blood supply (circulation) to your feet and legs. This may cause the skin on your feet to become thinner, break easier, and heal more slowly. Your skin may become dry, and the skin may peel and crack. You may also have nerve damage in your legs and feet causing decreased feeling in them. You may not notice minor injuries to your feet that could lead to infections or more serious problems. Taking care of your feet is one of the most important things you can do for yourself.  HOME CARE INSTRUCTIONS  Wear shoes at all times, even in the house. Do not go barefoot. Bare feet are easily injured.  Check your feet daily for blisters, cuts, and redness. If you cannot see the bottom of your feet, use a mirror or ask someone for help.  Wash your feet with warm water (do not use hot water) and mild soap. Then pat your feet and the areas between your toes until they are completely dry. Do not soak your feet as this can dry your skin.  Apply a moisturizing lotion or petroleum jelly (that does not contain alcohol and is unscented) to the skin on your feet and to dry, brittle toenails. Do not apply lotion between your toes.  Trim your toenails straight across. Do not dig under them or around the cuticle. File the edges of your nails with an emery board or nail file.  Do not cut corns or calluses or try to remove them with medicine.  Wear clean socks or stockings every day. Make sure they are not too tight. Do not wear knee-high stockings since they may decrease blood flow to your legs.  Wear shoes that fit properly and have enough cushioning. To break in new shoes, wear them for just a few hours a day. This prevents you from injuring your feet. Always look in your shoes before you put them on to be sure there are no objects inside.  Do not cross your legs. This may decrease the blood flow to your feet.  If you find a minor scrape,  cut, or break in the skin on your feet, keep it and the skin around it clean and dry. These areas may be cleansed with mild soap and water. Do not cleanse the area with peroxide, alcohol, or iodine.  When you remove an adhesive bandage, be sure not to damage the skin around it.  If you have a wound, look at it several times a day to make sure it is healing.  Do not use heating pads or hot water bottles. They may burn your skin. If you have lost feeling in your feet or legs, you may not know it is happening until it is too late.  Make sure your health care provider performs a complete foot exam at least annually or more often if you have foot problems. Report any cuts, sores, or bruises to your health care provider immediately. SEEK MEDICAL CARE IF:   You have an injury that is not healing.  You have cuts or breaks in the skin.  You have an ingrown nail.  You notice redness on your legs or feet.  You feel burning or tingling in your legs or feet.  You have pain or cramps in your legs and feet.  Your legs or feet are numb.  Your feet always feel cold. SEEK IMMEDIATE MEDICAL CARE IF:   There is increasing redness,   swelling, or pain in or around a wound.  There is a red line that goes up your leg.  Pus is coming from a wound.  You develop a fever or as directed by your health care provider.  You notice a bad smell coming from an ulcer or wound. Document Released: 02/06/2000 Document Revised: 10/11/2012 Document Reviewed: 07/18/2012 ExitCare Patient Information 2015 [employer]. This information is not intended to replace advice given to you by your health care provider. Make sure you discuss any questions you have with your health care provider.  

## 2014-01-04 ENCOUNTER — Ambulatory Visit: Payer: Self-pay | Admitting: Internal Medicine

## 2014-01-07 ENCOUNTER — Ambulatory Visit: Payer: Medicare HMO | Admitting: Podiatry

## 2014-01-15 ENCOUNTER — Emergency Department: Payer: Self-pay | Admitting: Emergency Medicine

## 2014-01-15 LAB — CBC
HCT: 39.9 % (ref 35.0–47.0)
HGB: 12.6 g/dL (ref 12.0–16.0)
MCH: 28 pg (ref 26.0–34.0)
MCHC: 31.6 g/dL — AB (ref 32.0–36.0)
MCV: 89 fL (ref 80–100)
PLATELETS: 249 10*3/uL (ref 150–440)
RBC: 4.5 10*6/uL (ref 3.80–5.20)
RDW: 13.8 % (ref 11.5–14.5)
WBC: 7.9 10*3/uL (ref 3.6–11.0)

## 2014-01-15 LAB — COMPREHENSIVE METABOLIC PANEL
ALK PHOS: 86 U/L
Albumin: 2.9 g/dL — ABNORMAL LOW (ref 3.4–5.0)
Anion Gap: 7 (ref 7–16)
BUN: 14 mg/dL (ref 7–18)
Bilirubin,Total: 0.2 mg/dL (ref 0.2–1.0)
CALCIUM: 7.8 mg/dL — AB (ref 8.5–10.1)
CO2: 28 mmol/L (ref 21–32)
Chloride: 107 mmol/L (ref 98–107)
Creatinine: 1.09 mg/dL (ref 0.60–1.30)
EGFR (Non-African Amer.): 53 — ABNORMAL LOW
Glucose: 320 mg/dL — ABNORMAL HIGH (ref 65–99)
Osmolality: 296 (ref 275–301)
Potassium: 4 mmol/L (ref 3.5–5.1)
SGOT(AST): 27 U/L (ref 15–37)
SGPT (ALT): 29 U/L
Sodium: 142 mmol/L (ref 136–145)
Total Protein: 7.2 g/dL (ref 6.4–8.2)

## 2014-01-15 LAB — TROPONIN I: Troponin-I: <0.02 ng/mL

## 2014-01-20 ENCOUNTER — Emergency Department: Payer: Self-pay | Admitting: Emergency Medicine

## 2014-01-20 LAB — URINALYSIS, COMPLETE
Bacteria: NONE SEEN
Bilirubin,UR: NEGATIVE
GLUCOSE, UR: NEGATIVE mg/dL (ref 0–75)
Ketone: NEGATIVE
Leukocyte Esterase: NEGATIVE
Nitrite: NEGATIVE
Ph: 6 (ref 4.5–8.0)
Protein: NEGATIVE
RBC,UR: 3 /HPF (ref 0–5)
Specific Gravity: 1.011 (ref 1.003–1.030)
Squamous Epithelial: 6
WBC UR: <1 /HPF (ref 0–5)

## 2014-01-20 LAB — BASIC METABOLIC PANEL
Anion Gap: 7 (ref 7–16)
BUN: 12 mg/dL (ref 7–18)
CO2: 34 mmol/L — AB (ref 21–32)
CREATININE: 1.04 mg/dL (ref 0.60–1.30)
Calcium, Total: 8.4 mg/dL — ABNORMAL LOW (ref 8.5–10.1)
Chloride: 99 mmol/L (ref 98–107)
EGFR (African American): >60
EGFR (Non-African Amer.): 56 — ABNORMAL LOW
GLUCOSE: 172 mg/dL — AB (ref 65–99)
OSMOLALITY: 283 (ref 275–301)
Potassium: 3.5 mmol/L (ref 3.5–5.1)
Sodium: 140 mmol/L (ref 136–145)

## 2014-01-20 LAB — CBC WITH DIFFERENTIAL/PLATELET
BASOS ABS: 0.1 10*3/uL (ref 0.0–0.1)
Basophil %: 0.6 %
EOS ABS: 0.2 10*3/uL (ref 0.0–0.7)
Eosinophil %: 2 %
HCT: 40.7 % (ref 35.0–47.0)
HGB: 13.1 g/dL (ref 12.0–16.0)
LYMPHS PCT: 31.8 %
Lymphocyte #: 3.7 10*3/uL — ABNORMAL HIGH (ref 1.0–3.6)
MCH: 28 pg (ref 26.0–34.0)
MCHC: 32.3 g/dL (ref 32.0–36.0)
MCV: 87 fL (ref 80–100)
MONO ABS: 1.3 x10 3/mm — AB (ref 0.2–0.9)
MONOS PCT: 11.6 %
NEUTROS ABS: 6.3 10*3/uL (ref 1.4–6.5)
Neutrophil %: 54 %
Platelet: 259 10*3/uL (ref 150–440)
RBC: 4.69 10*6/uL (ref 3.80–5.20)
RDW: 13.2 % (ref 11.5–14.5)
WBC: 11.6 10*3/uL — ABNORMAL HIGH (ref 3.6–11.0)

## 2014-01-20 LAB — TROPONIN I: Troponin-I: <0.02 ng/mL

## 2014-02-15 ENCOUNTER — Emergency Department: Payer: Self-pay | Admitting: Emergency Medicine

## 2014-02-15 LAB — COMPREHENSIVE METABOLIC PANEL
ANION GAP: 6 — AB (ref 7–16)
Albumin: 3.3 g/dL — ABNORMAL LOW (ref 3.4–5.0)
Alkaline Phosphatase: 91 U/L
BUN: 13 mg/dL (ref 7–18)
Bilirubin,Total: 0.3 mg/dL (ref 0.2–1.0)
CO2: 32 mmol/L (ref 21–32)
Calcium, Total: 8.6 mg/dL (ref 8.5–10.1)
Chloride: 102 mmol/L (ref 98–107)
Creatinine: 1.07 mg/dL (ref 0.60–1.30)
EGFR (African American): >60
GFR CALC NON AF AMER: 54 — AB
Glucose: 207 mg/dL — ABNORMAL HIGH (ref 65–99)
Osmolality: 286 (ref 275–301)
POTASSIUM: 3.8 mmol/L (ref 3.5–5.1)
SGOT(AST): 27 U/L (ref 15–37)
SGPT (ALT): 32 U/L
Sodium: 140 mmol/L (ref 136–145)
Total Protein: 7.9 g/dL (ref 6.4–8.2)

## 2014-02-15 LAB — CK TOTAL AND CKMB (NOT AT ARMC)
CK, TOTAL: 254 U/L — AB (ref 26–192)
CK-MB: 1.6 ng/mL (ref 0.5–3.6)

## 2014-02-15 LAB — CBC
HCT: 44.2 % (ref 35.0–47.0)
HGB: 14.1 g/dL (ref 12.0–16.0)
MCH: 28 pg (ref 26.0–34.0)
MCHC: 31.8 g/dL — ABNORMAL LOW (ref 32.0–36.0)
MCV: 88 fL (ref 80–100)
Platelet: 266 10*3/uL (ref 150–440)
RBC: 5.02 10*6/uL (ref 3.80–5.20)
RDW: 13.5 % (ref 11.5–14.5)
WBC: 9.3 10*3/uL (ref 3.6–11.0)

## 2014-02-15 LAB — TROPONIN I: Troponin-I: <0.02 ng/mL

## 2014-04-15 ENCOUNTER — Inpatient Hospital Stay: Payer: Self-pay | Admitting: Internal Medicine

## 2014-04-16 ENCOUNTER — Other Ambulatory Visit: Payer: Self-pay | Admitting: Physician Assistant

## 2014-04-16 DIAGNOSIS — I509 Heart failure, unspecified: Secondary | ICD-10-CM

## 2014-04-16 DIAGNOSIS — I10 Essential (primary) hypertension: Secondary | ICD-10-CM

## 2014-04-16 DIAGNOSIS — I361 Nonrheumatic tricuspid (valve) insufficiency: Secondary | ICD-10-CM

## 2014-04-19 ENCOUNTER — Telehealth: Payer: Self-pay

## 2014-04-19 NOTE — Telephone Encounter (Signed)
Attempted to contact pt regarding discharge from [facility] on 04/18/14.  No answer, voicemail box has been set up.

## 2014-04-21 ENCOUNTER — Inpatient Hospital Stay: Payer: Self-pay | Admitting: Internal Medicine

## 2014-04-22 ENCOUNTER — Other Ambulatory Visit: Payer: Self-pay | Admitting: Physician Assistant

## 2014-04-22 DIAGNOSIS — R0602 Shortness of breath: Secondary | ICD-10-CM

## 2014-04-22 DIAGNOSIS — I10 Essential (primary) hypertension: Secondary | ICD-10-CM

## 2014-04-22 DIAGNOSIS — R4182 Altered mental status, unspecified: Secondary | ICD-10-CM

## 2014-04-23 DIAGNOSIS — I509 Heart failure, unspecified: Secondary | ICD-10-CM

## 2014-04-26 ENCOUNTER — Encounter: Payer: Self-pay | Admitting: Cardiovascular Disease

## 2014-04-29 ENCOUNTER — Encounter: Payer: Self-pay | Admitting: Physician Assistant

## 2014-05-01 ENCOUNTER — Telehealth: Payer: Self-pay

## 2014-05-01 NOTE — Telephone Encounter (Signed)
Patient contacted regarding discharge from [facility] on 04/29/14.  Patient understands to follow up with Dr. Rockey Situ on 05/15/14 at 10:00 at [facility]. Patient understands discharge instructions? yes Patient understands medications and regiment? yes Patient understands to bring all medications to this visit? yes  Spoke w/ pt.  She states that she is unsure of how she will take her insulin tonight, as she cannot see out of one of her eyes. She states that she will call her son and see if he can come over to help her, though she is uncertain if he will know how to draw it up. Advised her to have him call the pharmacy if he has any trouble and they will be able to help him. She is appreciative of the call and will call back w/ any further questions or concerns.

## 2014-05-06 ENCOUNTER — Inpatient Hospital Stay: Payer: Self-pay | Admitting: Internal Medicine

## 2014-05-06 DIAGNOSIS — I5032 Chronic diastolic (congestive) heart failure: Secondary | ICD-10-CM | POA: Diagnosis not present

## 2014-05-06 DIAGNOSIS — I421 Obstructive hypertrophic cardiomyopathy: Secondary | ICD-10-CM | POA: Diagnosis not present

## 2014-05-06 DIAGNOSIS — J9621 Acute and chronic respiratory failure with hypoxia: Secondary | ICD-10-CM

## 2014-05-15 ENCOUNTER — Ambulatory Visit (INDEPENDENT_AMBULATORY_CARE_PROVIDER_SITE_OTHER): Payer: Medicare HMO | Admitting: Cardiovascular Disease

## 2014-05-15 ENCOUNTER — Encounter: Payer: Self-pay | Admitting: Cardiovascular Disease

## 2014-05-15 VITALS — BP 120/80 | HR 82 | Ht 66.0 in | Wt 289.0 lb

## 2014-05-15 DIAGNOSIS — G4733 Obstructive sleep apnea (adult) (pediatric): Secondary | ICD-10-CM | POA: Diagnosis not present

## 2014-05-15 DIAGNOSIS — R0602 Shortness of breath: Secondary | ICD-10-CM

## 2014-05-15 DIAGNOSIS — I5032 Chronic diastolic (congestive) heart failure: Secondary | ICD-10-CM | POA: Diagnosis not present

## 2014-05-15 DIAGNOSIS — I10 Essential (primary) hypertension: Secondary | ICD-10-CM

## 2014-05-15 DIAGNOSIS — Q248 Other specified congenital malformations of heart: Secondary | ICD-10-CM

## 2014-05-15 DIAGNOSIS — F209 Schizophrenia, unspecified: Secondary | ICD-10-CM

## 2014-05-15 NOTE — Patient Instructions (Signed)
1.  Please place Bipap for sleeping (overnight and w/ naps in the daytime) 2.  Please decrease Ativan down to 0.25 mg PO TID PRN for anxiety/nervousness (pt is too sleepy) 3.  Please wear knee-high TED hose in the daytime 4.  Please take Lasix 20 mg PO once daily on Monday, Wednesday, & Friday  Your physician recommends that you schedule a follow-up appointment in: 3 months

## 2014-05-15 NOTE — Assessment & Plan Note (Signed)
Recent hospital admissions for hypercapnia, somnolence, mental status changes. She was started on BiPAP in the hospital after being seen by pulmonary. Recommendation was for BiPAP overnight with sleeping, also when napping.  Very somnolent today possibly from high-dose is a benzodiazepines. This has also been a problem. We will decrease the dose of her Ativan down to 0.25 mg 3 times a day

## 2014-05-15 NOTE — Assessment & Plan Note (Signed)
Mild edema on today's visit. High risk of diastolic CHF. Overdiuresis in the hospital cause renal dysfunction from dehydration. Currently not on Lasix. We will start Lasix 3 times per week. Recommended compression hose

## 2014-05-15 NOTE — Progress Notes (Signed)
Patient ID: [name] [name], female    DOB: September 28, 1945, 69 y.o.   MRN: [id number]  HPI Comments: [name] [name] is a 69 year old woman with obstructive sleep apnea, previously noncompliant on her CPAP or BiPAP, diastolic CHF, ejection fraction 60%, several recent hospital admissions for shortness of breath recent hospital discharge 04/29/2014 with diagnosis of acute hypercapnic respiratory failure and requiring BiPAP to her hospital admission, repeat admission 05/06/2014 for shortness of breath, somnolence. She was also overmedicated, sleepy on her hospital admission at the beginning of March after her son gave her her sleeping pill and she was confused and difficult to arouse.  She presents today for follow-up. She has difficulty staying awake, sleeping through most of her visit. She is able to communicate only for short periods before falling back to sleep. She is sitting in a wheelchair on oxygen . She reports that she is using her BiPAP. She is uncertain if she has BiPAP for taking naps during the daytime  She is currently living at [facility] . She takes Ativan 0.5 mg 3 times a day when necessary, trazodone 100 mg in the evening  She reports having some mild shortness of breath   EKG on today's visit shows normal sinus rhythm with rate 82 bpm, T-wave abnormality V4 through V6, 1 and aVL   Lab work from the hospital any 1.08, BUN 23 Previous blood gases in the hospital have shown elevated CO2 levels upwards of 70 or higher BNP 57 With overdiuresis in the hospital creatinine and BUN have climbed with creatinine 1.38, BUN 22 chest x-ray clear. Results were reviewed with her but she was very sleepy      Allergies  Allergen Reactions  . Haldol [Haloperidol Decanoate] Swelling and Other (See Comments)    Tongue swells and blurred vision  . Metformin Diarrhea  . Raspberry Swelling    Other reaction(s): SWELLINGin lips    Outpatient Encounter Prescriptions as of 05/15/2014   Medication Sig  . acetaminophen (TYLENOL) 325 MG tablet Take 650 mg by mouth every 4 (four) hours as needed.  Marland Kitchen aspirin EC 81 MG tablet Take 81 mg by mouth daily.    Marland Kitchen atorvastatin (LIPITOR) 20 MG tablet Take 20 mg by mouth daily.  . benztropine (COGENTIN) 1 MG tablet Take 1 mg by mouth 2 (two) times daily.  . fluPHENAZine (PROLIXIN) 5 MG tablet Take 10 mg by mouth at bedtime.   . fluPHENAZine decanoate (PROLIXIN) 25 MG/ML injection Inject 50 mg into the muscle every 14 (fourteen) days. Unknown dose. Given at a [facility], states she will now have ACT team coming to her home to administer  . glipiZIDE (GLUCOTROL) 10 MG tablet Take 10 mg by mouth 2 (two) times daily before a meal.  . insulin glargine (LANTUS) 100 UNIT/ML injection Inject 15 Units into the skin at bedtime.  Marland Kitchen lisinopril (PRINIVIL,ZESTRIL) 5 MG tablet Take 5 mg by mouth daily.  Marland Kitchen LORazepam (ATIVAN) 0.5 MG tablet Take 0.5 mg by mouth every 8 (eight) hours.  Marland Kitchen oxybutynin (DITROPAN-XL) 10 MG 24 hr tablet Take 10 mg by mouth every morning.  . predniSONE (DELTASONE) 10 MG tablet Take 10 mg by mouth daily with breakfast.  . ranitidine (ZANTAC) 150 MG tablet Take 150 mg by mouth daily.    . sitaGLIPtin (JANUVIA) 100 MG tablet Take 100 mg by mouth daily.  . traZODone (DESYREL) 100 MG tablet Take 100 mg by mouth at bedtime.  . verapamil (CALAN) 40 MG tablet Take  40 mg by mouth 3 (three) times daily.    Past Medical History  Diagnosis Date  . DM2 (diabetes mellitus, type 2)   . Hypertension   . Hypercholesteremia   . Schizophrenia   . Osteoarthritis   . Bursitis   . OSA (obstructive sleep apnea)     does not use machine  . Left ventricular outflow tract obstruction     a. echo 03/2014: EF 60-65%, hypernamic LV systolic fxn, mod LVH w/ LVOT gradient estimated at 68 mm Hg w/ valsalva, very small LV internal cavity size, mildly increased LV posterior wall thickness, mild Ao valve scl w/o stenosis, diastolic  dysfunction, normal RVSP  . LVH (left ventricular hypertrophy)     a. echo suggests long standing uncontrolled htn. she will not do well when dehydrated, LV cavity obliteration  . Obesity     Past Surgical History  Procedure Laterality Date  . Tonsillectomy    . Knee reconstruction, medial patellar femoral ligament    . Cholecystectomy    . Colonoscopy  04/2011    [facility] per patient incomplete    Social History  reports that she has quit smoking. She has never used smokeless tobacco. She reports that she does not drink alcohol or use illicit drugs.  Family History family history is negative for Colon cancer and Liver disease.   Review of Systems  Constitutional: Positive for fatigue.  Respiratory: Positive for shortness of breath.   Cardiovascular: Negative.   Gastrointestinal: Negative.   Musculoskeletal: Negative.   Skin: Negative.   Neurological: Negative.   Hematological: Negative.   Psychiatric/Behavioral: Negative.    BP 120/80 mmHg  Pulse 82  Ht 5\' 6"  (1.676 m)  Wt 289 lb (131.09 kg)  BMI 46.67 kg/m2  Physical Exam  Constitutional: She is oriented to person, place, and time. She appears well-developed and well-nourished.  HENT:  Head: Normocephalic.  Nose: Nose normal.  Mouth/Throat: Oropharynx is clear and moist.  Eyes: Conjunctivae are normal. Pupils are equal, round, and reactive to light.  Neck: Normal range of motion. Neck supple. No JVD present.  Cardiovascular: Normal rate, regular rhythm, S1 normal, S2 normal, normal heart sounds and intact distal pulses.  Exam reveals no gallop and no friction rub.   No murmur heard. Pulmonary/Chest: Effort normal and breath sounds normal. No respiratory distress. She has no wheezes. She has no rales. She exhibits no tenderness.  Abdominal: Soft. Bowel sounds are normal. She exhibits no distension. There is no tenderness.  Musculoskeletal: Normal range of motion. She exhibits no edema or tenderness.  Lymphadenopathy:     She has no cervical adenopathy.  Neurological: She is alert and oriented to person, place, and time. Coordination normal.  Skin: Skin is warm and dry. No rash noted. No erythema.  Psychiatric: She has a normal mood and affect. Her behavior is normal. Judgment and thought content normal.    Assessment and Plan  Nursing note and vitals reviewed.

## 2014-05-15 NOTE — Assessment & Plan Note (Signed)
Blood pressure is well controlled on today's visit. No changes made to the medications. 

## 2014-05-15 NOTE — Assessment & Plan Note (Signed)
We'll try to avoid overdiuresis given her LV outflow tract obstruction. We'll continue verapamil for heart rate control, and significant LVH

## 2014-05-15 NOTE — Assessment & Plan Note (Signed)
Very somnolent on today's visit. I suspect this is from medication side effect. We will decrease her Ativan down to 0.25 mg 3 times a day. It was difficult to keep her awake on today's visit.

## 2014-06-07 ENCOUNTER — Telehealth: Payer: Self-pay

## 2014-06-07 NOTE — Telephone Encounter (Signed)
Nurse from [facility] care called, states pt was discharged from them on 4/11. States there was no order sent over when pt was there for a BiPAP. She states that her [facility] D/S summary states pt is to be weaned off of BiPAP.  Nurse says the son states Dr. Rockey Situ ordered the BiPAP. Please advise.

## 2014-06-07 NOTE — Telephone Encounter (Signed)
Spoke w/ Caryl Comes.  Advised her that I reviewed pt's chart in [facility] and it looks that the hospitalist ordered pt's BiPAP. She is appreciative of the call and will contact [facility].

## 2014-06-11 NOTE — Discharge Summary (Signed)
PATIENT NAME:  [name] [name], [name] [name] MR#:  [id number] DATE OF BIRTH:  [birth year]  DATE OF ADMISSION:  09/16/2011 DATE OF DISCHARGE:  09/17/2011  HISTORY AND PHYSICAL: For a detailed note, please take a look at the History and Physical done by Dr. Warren Danes.   DIAGNOSES AT DISCHARGE:  1. Acute diastolic congestive heart failure.  2. Shortness of breath due to acute diastolic congestive heart failure.  3. Diabetes.  4. Obstructive sleep apnea.  5. Mild pulmonary hypertension.  6. History of schizophrenia.  7. Gastroesophageal reflux disease.   DIET: The patient is being discharged on a low sodium, low fat, American Diabetic Association diet.   ACTIVITY: As tolerated.   FOLLOWUP: With Dr. Marval Regal at [facility] in the next 1 to 2 weeks.   DISCHARGE MEDICATIONS:   1. Depakote 500 mg at bedtime.  2. Lasix 40 mg daily.  3. Metformin 5 mg b.i.d.  4. Losartan 75 mg daily.  5. Lisinopril 40 mg daily.  6. Ranitidine 150 mg b.i.d.  7. Aspirin 81 mg daily.   PERTINENT STUDIES DONE DURING THE HOSPITAL COURSE: A chest x-ray done on admission showed cardiomegaly but no definite acute cardiopulmonary disease.   HOSPITAL COURSE: The patient is a 69 year old female with medical problems as mentioned above, presented to the hospital on July 25th complaining of shortness of breath and worsening lower extremity edema.   1. Acute on chronic diastolic congestive heart failure: This was likely the cause of the patient's shortness of breath. The patient has underlying congestive heart failure, is already on diuretics. She was not improving and was having worsening lower extremity edema. She was therefore admitted and started on IV Lasix. She is about 3 liters negative since admission and clinically feels a lot better. She is therefore being discharged back on her home dose of Lasix with close follow-up with her primary care physician as an outpatient. She did have an echocardiogram done which showed  normal ejection fraction but mild pulmonary hypertension, therefore this was likely acute on chronic diastolic dysfunction.  2. Diabetes: The patient's metformin was held during the hospital course, although she can resume that upon discharge. She had no evidence of hypo or hyperglycemia.  3. Obstructive sleep apnea: Likely the cause of the patient's mild pulmonary hypertension. The patient was told to be compliant with her CPAP, and she was maintained on that.  4. Hypertension: The patient remained hemodynamically stable on her losartan and lisinopril. She will resume that.  5. History of schizophrenia: The patient is on Depakote daily. She will continue her Depakote as stated.  6. Gastroesophageal reflux disease: The patient was maintained on her ranitidine, and she will resume that upon discharge.   CODE STATUS:  The patient is a FULL CODE.     TIME SPENT: 40 minutes.    ____________________________ Belia Heman. Verdell Carmine, MD vjs:cbb D: 09/17/2011 15:02:48 ET T: 09/17/2011 15:27:04 ET JOB#: [id number]  cc: Belia Heman. Verdell Carmine, MD, <Dictator> Manual Meier. Bridget Hartshorn, MD [name] MD ELECTRONICALLY SIGNED 09/17/2011 16:20

## 2014-06-11 NOTE — H&P (Signed)
PATIENT NAME:  [name] [name], [name] [name] MR#:  [id number] DATE OF BIRTH:  1945/02/27  DATE OF ADMISSION:  09/16/2011  PRIMARY CARE PHYSICIAN:  Dr. Marval Regal  CHIEF COMPLAINT: Bilateral leg swelling and shortness of breath.   HISTORY OF PRESENT ILLNESS: 69 year old female with past medical history of sleep apnea, schizophrenia, chronic leg edema, gastroesophageal reflux disease, diabetes, and hypertension who presents with several days' duration of progressive bilateral lower extremity edema and shortness of breath. The patient currently is a very poor historian, is somnolent, and so history is obtained partially from the patient and also from the ED records. Reportedly the patient has had bilateral leg swelling and shortness of breath for several days that is still present. She has baseline extremity edema; however, this is worse than her usual. Her Lasix dose was recently increased from 20 to 40 mg; however, there was no improvement in the lower extremity edema. She denies history of deep vein thrombosis.  Upon evaluation in the Emergency Department  she was found to be hypoxic on ABG with arterial oxygen level of PO2 of 56 so she was placed on supplemental oxygen.  She also reports having a mass on her kidney that is being evaluated.   The patient was recently seen at [facility] for "cancer work-up".   REVIEW OF SYSTEMS: Notable for mild chest pain, shortness of breath, and leg swelling.  Ten-point review of systems was performed and patient denies all other symptoms other than stated above in history of present illness.   PAST MEDICAL HISTORY:  1. Diabetes.  2. Hypertension. 3. Gastroesophageal reflux disease. 4. Sleep apnea.  5. Schizophrenia. 6. Chronic leg edema.  7. ? kidney mass being evaluated for possible malignancy.   PAST SURGICAL HISTORY: None.   MEDICATIONS:  1. Lasix 40 mg daily.  2. Aspirin 81 mg daily.  3. Losartan 50 mg, 1-1/2 tabs daily for a total of 75 mg daily.  4. Metformin  500 mg twice daily.  5. Ranitidine 150 mg twice daily.  6. Lisinopril 40 mg daily.   ALLERGIES: Haldol, which causes swelling.   SOCIAL HISTORY: Unable to obtain.   FAMILY HISTORY: Notable for diabetes and hypertension.   PHYSICAL EXAM:  VITAL SIGNS: Temperature 96.9, pulse 75, systolic blood pressure ranging between 156 and 053 with diastolics ranging from 69 to 72. Respirations 20, sating 96% on supplemental oxygen.   GENERAL:  The patient is somnolent, responds to loud voice.  She is in no apparent distress.  HEENT: Eyes show mild crusting around her eyes. Otherwise sclerae is anicteric. There is normal external ears and nares, normal buccal mucosa without oral lesions. Moist mucous membranes. There is posterior oropharynx crowding.   NECK: Short neck but there is no thyromegaly appreciated.   LUNGS:  Bibasilar rhonchi, mild.   CARDIAC: Regular rate and rhythm. No murmurs appreciated.   ABDOMEN:  Obese, rotund, nontender, soft, with normal bowel sounds, nondistended.   EXTREMITIES: 3+ edema to the knees bilaterally which is tender to palpation on the left. There is no clubbing. There is no cyanosis.   NEUROLOGIC: Patient is somnolent at this time, but otherwise exam is nonfocal.   LABORATORY DATA:  Glucose 121, BNP 296, BUN 16, creatinine of 1.39, which is slightly increased from 1.28 in May of 2013. Sodium is 143, potassium 3.5, chloride 100, bicarbonate 32, GFR of 46, osmolality 282, anion gap of 8, calcium of 9.3.   LFTs show total protein 7.5, albumin 3.4, bilirubin 0.4, direct bilirubin less than 0.05,  alkaline phosphatase of 76, AST of 50 and ALT of 47.   CK 401, CK-MB is 1.6, and troponin is less than 0.02.   CBC normal with a WBC count 10, hemoglobin 12, hematocrit 38.3, and platelets of 234, MCV of 88.  ABG shows a pH of 7.41. PCO2 of 55,  PO2 of 56, which is low, FiO2 21%, base excess 8.4, bicarbonate is 34.9, sating 89%.   Chest x-ray shows cardiomegaly with mild  cephalization on preliminary read.   ASSESSMENT AND PLAN:   69 year old female with history of hypertension and obstructive sleep apnea presenting with progressive dyspnea and lower extremity edema, found to be hypoxic, hypertensive, with mild pulmonary edema on chest x-ray, consistent with congestive heart failure.  1. Dyspnea and lower extremity edema secondary to acute decompensated congestive heart failure. Underlying etiology unclear. This is likely diastolic. The patient has hypertension; currently systolic blood pressure is elevated. We will manage her blood pressure, we will diurese her with IV Lasix at this time. We will check an echo and trend cardiac enzymes to rule out any possibility of underlying ischemia and consider cardiology consult to assess for coronary artery disease, although this might be considered as an outpatient. We will continue her ACE inhibitor, aspirin. Will start low-dose beta blocker after acute congestive heart failure exacerbation resolves.  2. Hypertension. Resume home medications with plan for IV medications as needed.  3. Obstructive sleep apnea.  ABG shows the patient is a chronic carbon dioxide retainer consistent with obstructive sleep apnea. However, she is not acutely hypercarbic at this time. We will place her on nocturnal BiPAP while she is  hospitalized. It is unclear if she uses BiPAP at home  4. Diabetes mellitus. Continue home dose of metformin. We will discontinue this if she needs any IV contrast. Continue aspirin as well. 5. Gastroesophageal reflux disease. We will continue her ranitidine.  6. Schizophrenia. Reviewed medication list and it does not show any medication that she is currently taking for this diagnosis. We will monitor. 7. Prophylaxis with Lovenox. 8. Disposition: Floor telemetry. 9. CODE STATUS: FULL CODE.   Thank you for involving [place] in the care of this patient.    ____________________________ Samson Frederic,  DO aeo:bjt D: 09/16/2011 08:20:41 ET T: 09/16/2011 09:42:32 ET JOB#: [id number]  cc: Samson Frederic, DO, <Dictator> Manual Meier. Bridget Hartshorn, MD [place] SIGNED 09/17/2011 12:59

## 2014-06-14 NOTE — Consult Note (Signed)
PATIENT NAME:  [name] [name], [name] [name] MR#:  [id number] DATE OF BIRTH:  August 31, 1945  DATE OF CONSULTATION:  06/09/2012  REFERRING PHYSICIAN:   Dr. Franchot Mimes.   CONSULTING PHYSICIAN:  Theodoro Grist, MD  CHIEF COMPLAINT:   Consult was requested by Dr. Franchot Mimes in regards to management of diabetes, as well as hypertension.   HISTORY OF PRESENT ILLNESS:   The patient is a 69 year old African American female with history of CHF, diastolic, chronic; history of hypertension, obstructive sleep apnea, noncompliant, who presents to the hospital when she was noted to be paranoid with erratic behavior, having tangential, also racing thoughts, and was admitted by Dr. Franchot Mimes for paranoia to the hospital. She was also noted to have hyperglycemia with glucose level of 130 yesterday night on admission, as well as 208 today in the morning. Her blood pressure was also noted to be elevated above 151 systolic, and the hospitalist services were contacted for consultation.   PAST MEDICAL HISTORY: Significant for history of CHF, chronic, diastolic; history of dyspnea due to CHF, history of diabetes mellitus, obstructive sleep apnea, not on CPAP at home; mild pulmonary hypertension, systemic hypertension, obesity, schizophrenia, gastroesophageal reflux disease, chronic leg edema, also questionable kidney mass, which was evaluated for possible malignancy in the past.   MEDICATIONS: According to medical records, the patient was on Norvasc 10 mg p.o. daily, aspirin 81 mg p.o. daily, Coreg 25 mg p.o. twice daily, glipizide 5 mg p.o. daily. She was, in the past, on losartan, as well as lisinopril, but she is not taking those medications.  She is not also on metformin anymore since it gave her diarrhea. She is not on ranitidine as well.     PAST SURGICAL HISTORY: According to medical records, none.   ALLERGIES: HALDOL, WHICH CAUSES SWELLING.   SOCIAL HISTORY: Difficult to obtain due to racing thoughts. Patient being tangential and just not  answering questions.   FAMILY HISTORY:  According to medical records, notable for diabetes, as well as hypertension.   REVIEW OF SYSTEMS:  Very difficult to obtain, however, the patient tells she has snoring, however, not using any CPAP. She admits to having right lower extremity edema, as well as right eye cataract, and feeling somewhat chilly here in the room.  Denies any high fevers or chills, fatigue, weakness, pains, weight loss or gain.  EYES:  Denies blurry vision, double vision, glaucoma or cataracts.  EARS, NOSE, THROAT:  Denies any tinnitus, allergies, epistaxis, sinus pain, dentures, difficulty swallowing.  RESPIRATORY:  No hemoptysis, asthma, COPD.  CARDIOVASCULAR: Denies chest pains, orthopnea, edema, arrhythmias or palpitations.  GASTROINTESTINAL: No nausea, vomiting, diarrhea or constipation.  GENITOURINARY: Denies dysuria, hematuria or frequently. Otherwise, has some polydipsia and nocturia.  SKIN: Denies any acne, rashes.  NEUROLOGIC: No numbness or epilepsy.  PSYCHIATRY:  Denies any insomnia or depression. Feels that she is not psychotic. She does not have any history of schizophrenia.   PHYSICAL EXAMINATION:  VITAL SIGNS:  Temperature is 97.6, pulse 95, respirations 20, blood pressure 140/113, while she was sitting 176/84. When she was standing, O2 sats were from 88% to 97% on room air, according to medical records. On arrival to the Emergency Room, the patient's blood pressure was 196.  During my evaluation, the patient is initially asleep. I was able to wake her up with a simple shake, and she now is awake and she is loud, somewhat irritable, paranoid,  has racing thoughts, and does not allow me to ask questions much.  Has somewhat pressured speech  and rambling about drugs. GENERAL: This is a well-developed, well-nourished, obese African American female in no significant distress, comfortable walking, as well as sitting in the chair.  HEENT:  Her pupils are equal, reactive to  light.  Extraocular muscles intact.  No icterus, no conjunctivitis.  Has normal hearing. No oropharyngeal erythema.  Mucosa is moist. NECK: No masses. Supple, nontender. Thyroid is not enlarged. No adenopathy. No JVD or carotid bruits bilaterally. Full range of motion.  LUNGS: Clear to auscultation though diminished breath sounds bilaterally. No rales, rhonchi or wheezing. No labored inspirations, increased effort, dullness to percussion. No overt respiratory distress.  CARDIOVASCULAR: S1, S2 appeared distant.  PMI not lateralized. Chest is nontender to palpation.   EXTREMITIES:  Diminished pedal pulses,  1+ lower extremity edema, mostly on the right side, some edema on the left lower extremity, however, not significant. No calf tenderness or cyanosis was noted.  ABDOMEN: Soft, nontender. Bowel sounds are present. No hepatosplenomegaly or masses were noted.  RECTAL: Deferred.  MUSCLE STRENGTH: Able to move all extremities. No cyanosis, degenerative joint disease or kyphosis. Gait is not tested. SKIN:  No masses, lesions, erythema, nodularity or induration.  NEUROLOGICAL: Cranial nerves grossly intact. Sensory is intact. No dysarthria, aphasia.  The patient is alert, disoriented, poorly cooperative. Memory is impaired.  The patient is somewhat confused as well as agitated.     LABORATORY DATA AND DIAGNOSTICS: EKG has not been done yet.   BMP: Glucose 130, otherwise BMP was unremarkable. The patient's alcohol level was less than 3. Total protein is elevated at 8.6. AST elevation to 56, otherwise liver enzymes are normal. TSH normal at 1.68.  Urine drug screen was negative. White blood cell count is 9.9, hemoglobin was 15.5, platelet count 275.  URINALYSIS: Yellow clear urine, negative for glucose, bilirubin or ketones. Specific gravity 1.010, pH was 6.0, negative for blood, protein, nitrites or leukocyte esterase, less than 1 red blood cell, 1 white blood cell, 1+ bacteria, 2 epithelial cells. Tylenol level  was less than 2 and salicylate level was less than 1.7.    RADIOLOGIC STUDIES:  None.   ASSESSMENT AND PLAN: 1.  Diabetes mellitus. Continue the patient on glipizide, as well as sliding scale insulin. We will check the patient's hemoglobin A1c. We may advance the patient's glipizide. We will not be able to add metformin due to the patient developing diarrhea, according to report.  2.  Pulmonary hypertension. Add lisinopril, advance it as needed.  3.  Lower extremity swelling, likely right-sided heart failure due to pulmonary hypertension. Lasix is initiated by the admitting physician.  4.  History of gastroesophageal reflux disease. Continue the patient on ranitidine, which is Zantac.  5.  History of paranoid schizophrenia, per psychiatry.   TIME SPENT: 50 minutes.   ____________________________ Theodoro Grist, MD rv:dmm D: 06/09/2012 14:46:35 ET T: 06/09/2012 21:06:39 ET JOB#: [id number]  cc: Theodoro Grist, MD, <Dictator> Destry Bezdek MD ELECTRONICALLY SIGNED 06/25/2012 18:11
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
## 2014-06-14 NOTE — H&P (Signed)
PATIENT NAME:  [name] [name], [name] [name] MR#:  [id number] DATE OF BIRTH:  06/05/1945  DATE OF ADMISSION:  12/17/2012  PRIMARY CARE PROVIDER: Unknown.   REFERRING PHYSICIAN: Dr. Rainey Pines.    CHIEF COMPLAINT: Decrease in responsiveness.   HISTORY OF PRESENT ILLNESS: The patient is a 69 year old African American female who is admitted to behavioral medicine for psychosis as well as schizophrenia who I was asked to see earlier regarding accelerated hypertension. At that time, the patient was a little sleepy, but as the day has progressed, the patient has gotten more lethargic. She was also noticed to be a little hypoxic. Therefore, we were asked to re-evaluate the patient. The patient has become more sleepy and is hard to arouse. Therefore, I was asked to transfer the patient to our service. She is currently very sleepy. She does wake up but is not able to answer most of the questions. She is on multiple sedating medications including temazepam at bedtime, lorazepam q.6 p.r.n. She is also on Zyprexa. Also, she has had progressive shortness of breath according to the nurse. Otherwise, the patient at this point is unable to provide me with any review of systems due to her lethargic state.   PAST MEDICAL HISTORY: Significant for schizoaffective disorder, sleep apnea, chronic peripheral edema, GERD, hypertension, diabetes.   ALLERGIES: METFORMIN AND HALOPERIDOL.   MEDICATIONS: She is getting ibuprofen 800 one tab p.o. q.6 p.r.n., nicotine 1 cartridge p.r.n., aspirin 81 one tab p.o. daily, carvedilol 25 b.i.d., glipizide 10 b.i.d., lisinopril 40 daily, Cepacol lozenges p.r.n., Tylenol 650 q.4 p.r.n., temazepam 30 at bedtime, lorazepam 2 mg q.6 p.r.n., Zyprexa 15 mg b.i.d., Detrol LA 4 mg daily, omeprazole 20 b.i.d., clonidine TTS-3 patch, Lasix 40 daily, hydralazine p.r.n., Invega 6 mg daily.   SOCIAL HISTORY: According to earlier, the patient denied smoking, alcohol or drug use.   FAMILY HISTORY: Positive for  hypertension.   REVIEW OF SYSTEMS: Not obtainable.   PHYSICAL EXAMINATION:  VITAL SIGNS: Temperature 98.6, pulse 60, respirations 18, blood pressure 130/80.  GENERAL: The patient is morbidly obese, very lethargic. Does open her eyes. Able to mumble some words but then goes back to sleep.  HEENT: Head atraumatic, normocephalic. Pupils equally round, reactive to light and accommodation. There is no conjunctival pallor. No scleral icterus. Nasal exam shows no drainage or ulceration. Oropharynx is clear without any exudate.  NECK: Supple without any JVD.  CARDIOVASCULAR: Regular rate and rhythm. No murmurs, rubs, clicks or gallops. PMI is not displaced.  LUNGS: Distant breath sounds. There are no appreciable rales, rhonchi or wheezing.  ABDOMEN: Soft, nontender, nondistended. Positive bowel sounds x 4. There is no hepatosplenomegaly.  EXTREMITIES: Edema 2+.  PSYCHIATRIC: The patient currently very lethargic.  NEUROLOGIC: Limited examination. Earlier, she was moving all extremities. Currently very lethargic and unable to do a thorough neuro exam.  SKIN: No rash.  LYMPHATICS: No lymph nodes palpable.  VASCULAR: Good DP, PT pulses.   LABORATORIES: Showed a glucose 105, BUN 14, creatinine 1.06, sodium 143, potassium 3.9, chloride 104, CO2 is 34, calcium 8.3. LFTs showed albumin of 2.9. WBC 7.5, hemoglobin 12.2, platelet count 223. ABG pH of 7.40, pCO2 of 62, pO2 of 58, FiO2 of 21%. Chest x-ray: Very poor x-ray with possible bilateral infiltrates, suggesting congestive heart failure.   ASSESSMENT AND PLAN: The patient is a 69 year old who was evaluated earlier in consult who has had progressive drowsiness and decrease in responsiveness.  1. Acute encephalopathy: ABG shows compensated hypercarbic respiratory failure, likely due to  sleep apnea. Current drowsy state likely due to multiple sedating medications. At this time, I will hold all psychiatric medications that are sedating. Will continue to monitor  her closely. If no improvement, then will get neurology as well as a CT scan of the head.  2. Acute hypoxic respiratory failure, likely due to sedation and possible congestive heart failure based on chest x-ray: BNP is mildly elevated. At this time, I will treat her with intravenous Lasix. Will get an echocardiogram of the heart.  3. Accelerated hypertension: Will continue Lasix, clonidine patch, Coreg b.i.d. and p.r.n. hydralazine.  4. Diabetes: Blood glucose under good control. I will do sliding scale insulin for now. Hold glipizide.  5. Gastroesophageal reflux disease: Will continue omeprazole.  6. Morbid obesity.  7. Schizophrenia with altered mental status: Will hold all sedating medications for now.  8. Miscellaneous: Will place her on Lovenox for deep vein thrombosis prophylaxis.   TIME SPENT: Note, 50 minutes spent. Please note the patient was evaluated separately for a consult. This is an admission for this patient.    ____________________________ Lafonda Mosses. Posey Pronto, MD shp:gb D: 12/17/2012 18:14:50 ET T: 12/17/2012 21:20:03 ET JOB#: [id number]  cc: Christabell Loseke H. Posey Pronto, MD, <Dictator> Alric Seton MD ELECTRONICALLY SIGNED 12/19/2012 12:13

## 2014-06-14 NOTE — Discharge Summary (Signed)
PATIENT NAME:  KORRI, ASK MR#:  [id number] DATE OF BIRTH:  08-Jun-1945  DATE OF ADMISSION:  12/05/2012 DATE OF DISCHARGE:  12/17/2012  REFERRING PHYSICIAN:  Emergency room M.D.  ADMITTING PHYSICIAN:  Orson Slick, M.D.  INITIAL REASON FOR PSYCHIATRIC EVALUATION AND IDENTIFYING INFORMATION:  The patient is a 69 year old African American female with long history of schizophrenia and noncompliance with medication. She was admitted due to noncompliant of her medications, and she returns to the hospital floridly psychotic, agitated and disorganized. Prior to the admission, she drove herself to [place] where she believes she rented an apartment. She was very agitated initially in the Emergency Room, psychotic and was refusing her medications. However, with time, she did accept Zyprexa, Zydis and Depakote. Her Depakote level became therapeutic. She initially experienced severe sedation with clonazepam, but it resolved. She was very intrusive, delusional, paranoid and over talkative. With medication adjustment, she became more stable, and she was admitted to the inpatient hospital for stabilization and safety.   PAST PSYCHIATRIC HISTORY: The patient has a history of multiple hospitalizations and several medications have been tried in the past. She was previously on PROLIXIN DEC, [place]. SHE NOW REFUSES THAT AS SHE IS ALLERGIC TO IT.  She has been noncompliant with her physician appointments  as well. There is no history of suicide attempts in the past.   PAST MEDICAL HISTORY: The patient has history of obesity, diabetes, hypertension and GERD.   ALLERGIES: HALDOL AND METFORMIN.   MEDICATIONS ON ADMISSION: Lisinopril 20 mg p.o. daily, glipizide 10 mg twice daily, aspirin 81 mg daily, Geodon 50 mg every 8 hours for psychosis, clonazepam 0.5 mg 3 times daily, carvedilol 25 mg twice daily, furosemide 40 mg p.o. daily, ranitidine 150 mg daily, and she remains noncompliant with them.    PHYSICAL EXAMINATION: VITAL SIGNS: Blood pressure 138/84, pulse 67, respirations 20, temperature 98.2.  MENTAL STATUS EXAMINATION: The patient is a moderately obese female who was wearing hospital scrubs. She was somewhat agitated, but was awake, alert and oriented. She knows the reason why she was admitted to the hospital but remains focused on being discharged and reported that she has a place in [place] where she wants to go. She was talking about her and discharge planning. It was difficult to redirect her. The staff also noted that she is becoming a little bit more depressed and disoriented throughout  the day.  However, she denied having any auditory or visual hallucinations. She remains with poor insight and judgment.   DIAGNOSTIC IMPRESSION: AXIS I:  Schizophrenia, chronic, paranoid type with acute exacerbation.  AXIS II: None.  AXIS III: Hypertension, which remains labile; diabetes, GERD, leg edema, obesity.  AXIS IV: Severe mental illness. Treatment noncompliance.  AXIS V:  Current Global Assessment of Functioning: 35.  PLAN: The patient was admitted to [facility] behavioral health unit for stabilization and safety. She was restarted back on all her medications, and they were adjusted throughout  her stay in the unit. During this stay in the hospital, the medications were adjusted. Her Depakote level was within the therapeutic range. However, when the ammonia level was done, it was noted to be slightly elevated. I discontinued the Depakote, so the ammonia level can start trending down. The patient started becoming somewhat somnolent and was lethargic and was sitting in the chair. Yesterday, hospitalist consult was placed, as the patient appeared to be becoming more drowsy and was not participating in the groups as well. Her blood pressure also became  somewhat labile. Dr. Posey Pronto evaluated the patient and ordered some labs.  However, she became a little bit more sedated. Dr. Posey Pronto decided to  transfer the patient to the hospital floor for continuity of care and to monitor her on a closer basis. They also ordered some labs on her. At the time of discharge, the patient was psychiatrically stable, and does not have SI/HI or plans.    SUICIDE RISK ASSESSMENT: At the time of discharge, the patient has long history of mental illness, but she did not exhibit any suicidal ideations or plans. She continues to have some delusional thinking, as well as paranoia. However, she was not actively psychotic at this time. She is compliant with her psychotropic medications.   FINAL DIAGNOSIS: AXIS I:  Schizophrenia, chronic, paranoid type with acute exacerbation.  AXIS II: None.  AXIS III: Hypertension, obesity, diabetes and gastroesophageal reflux disease.  AXIS IV: Severe mental illness.  AXIS V: Current Global Assessment of Functioning: 35.   MEDICATIONS AT THE TIME OF:   1.  Temazepam 30 mg p.o. at bedtime.  2.  Zyprexa Zydis 15 mg p.o. b.i.d.  3.  Invega ER 6 mg p.o. q.a.m.  4.  Hydralazine 50 mg q.i.d. p.r.n. for blood pressure.  5.  Furosemide 40 mg p.o. b.i.d.  6.  Omeprazole 20 mg p.o. b.i.d.  7.  Triple Antibiotic ointment. 8.  Detrol LA 4 mg p.o. daily.  9.  Lorazepam 2 mg p.o. q.6h. p.r.n. for agitation.  10.  Magnesium hydroxide 30 mL at bedtime p.r.n. for constipation.   She needs close psychiatric follow up on the medical floor.   ____________________________ Cordelia Pen Gretel Acre, MD usf:dmm D: 12/18/2012 09:33:00 ET T: 12/18/2012 10:38:31 ET JOB#: [id number]  cc: Cordelia Pen. Gretel Acre, MD, <Dictator> Jeronimo Norma MD ELECTRONICALLY SIGNED 12/18/2012 14:47

## 2014-06-14 NOTE — Consult Note (Signed)
Brief Consult Note: Diagnosis: Schizoaffective disorder bipolar type.   Patient was seen by consultant.   Recommend further assessment or treatment.   Orders entered.   Comments: [name] [name] has a h/o psychosis, mood instability and treatment noncompliance here for worsening psychosis. She feels much better. There is no agitation. She is friendly but very intrussive. She has been taking medications here but promises not to take any when discharged. She is waiting placement in [place].   PLAN: 1. Psychosis-she is on Zyprexa zydis 15 mg bid. She accepts treatment. Will consider injectable antipsychotic.   2. Mood-she is depakote 500 mg tid. VPA level in am,   3. Sedation-she fell on Friday and clonazepam was discontinued.   4. Will consider admission to BMU if compliant with medications.  Electronic Signatures: Orson Slick (MD)  (Signed 14-Oct-14 00:22)  Authored: Brief Consult Note   Last Updated: 14-Oct-14 00:22 by Orson Slick (MD)
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
## 2014-06-14 NOTE — Consult Note (Signed)
PATIENT NAME:  [name] [name], [name] [name] MR#:  [id number] DATE OF BIRTH:  1945/11/21  DATE OF CONSULTATION:  12/19/2012  REFERRING PHYSICIAN:  Jolanta B. Bary Leriche, MD CONSULTING PHYSICIAN:  Carla Whilden S. Manuella Ghazi, MD  PRIMARY CARE PHYSICIAN: None.  REASON FOR CONSULTATION: Uncontrolled blood pressure.   HISTORY OF PRESENT ILLNESS: The patient is a 69 year old African-American female with a known history of psychosis and agitation, who was admitted by psychiatry service for psychosis yesterday. We are being consulted for uncontrolled blood pressure. Looking back in her vitals, her blood pressure ranges anywhere from 841Y up to 606 systolic and 301 to 601 diastolic, with a mean averaging anywhere from 114 up to 140. The patient denies any symptoms other than lower extremity edema, which has been chronic for her. She reports not taking any blood pressure medications at home as her blood pressure was fine. The patient feels her son is a bad boy and her visiting her son made things worse, with her getting upset and crying, and he brought her to the hospital.   PAST MEDICAL HISTORY:  1. Schizophrenia.  2. Morbid obesity. 3. Diabetes.   4. Hypertension.  5. GERD.   ALLERGIES: HALDOL AND METFORMIN.   SOCIAL HISTORY: She lives with her son. No alcohol. No smoking; she quit in 2009.   FAMILY HISTORY: Positive for hypertension in father.   MEDICATIONS AT HOME:  1. Tylenol 650 mg p.o. every 4 hours as needed.  2. Aspirin 81 mg p.o. daily. 3. Coreg 25 mg p.o. b.i.d.  4. Clonidine 1 patch transdermal once a week.  5. Lasix 20 mg p.o. daily.  6. Lisinopril 10 mg p.o. daily.  7. Tolterodine 4 mg p.o. daily.   REVIEW OF SYSTEMS:  CONSTITUTIONAL: No fever, fatigue, weakness.  EYES: No blurred or double vision.  ENT: No tinnitus or ear pain.  RESPIRATORY: No cough, wheezing, hemoptysis.  CARDIOVASCULAR: No chest pain, orthopnea. Positive for edema. GASTROINTESTINAL: No nausea, vomiting, diarrhea.  GENITOURINARY:  No dysuria or hematuria.  ENDOCRINE: No polyuria or nocturia.  HEMATOLOGY: No anemia or easy bruising.  SKIN: No rash or lesion.  MUSCULOSKELETAL: No arthritis or muscle cramp.  NEUROLOGIC: No tingling, numbness, weakness.  PSYCHIATRIC: No history of anxiety or depression. Positive for schizophrenia.   PHYSICAL EXAMINATION:  VITAL SIGNS: Temperature 97.6, heart rate 71 per minute, respirations 24 per minute, blood pressure 182/89 mmHg, oxygen saturation 92% on room air.  GENERAL: The patient is a 69 year old female lying in the lazy chair outside her room comfortably, without any acute distress.  EYES: Pupils equal, round and reactive to light and accommodation. No scleral icterus. Extraocular muscles intact.  HEENT: Head atraumatic, normocephalic. Oropharynx and nasopharynx clear.  NECK: Supple. No jugular venous distention. No thyroid enlargement or tenderness.  LUNGS: Clear to auscultation bilaterally. No wheezing, rales, rhonchi or crepitation.  CARDIOVASCULAR: S1, S2 normal. No murmurs, rubs or gallops.  ABDOMEN: Soft, nontender, nondistended. Bowel sounds present. No organomegaly or masses.  EXTREMITIES: She does have 2+ pitting edema, both lower extremities.  MUSCULAR: No joint effusion or swelling or tenderness.  SKIN: No obvious rash, lesion or ulcer.  PSYCHIATRIC: The patient is disheveled. Has tangential ideas. She denies any suicidal or homicidal ideation.  NEUROLOGICAL: Cranial nerves II through XII intact. Muscle strength 5 out of 5 in all extremities. Sensation intact.   LABORATORY PANEL Normal BMP. Normal liver function tests. Normal CBC. Urine culture was contaminated. Her UA was negative.   IMPRESSION AND PLAN:  1. Uncontrolled hypertension, likely due  to medication noncompliance and underlying psychiatric problems. She does have lower extremity swelling. Will increase her Lasix from 20 once a day to 20 twice a day. Will also increase the clonidine patch from 0.1 to 0.2 mg  once a week. She is already on clonidine oral as needed for better blood pressure control, and I would continue Lasix and Coreg as ordered. Will monitor her closely and follow through during her stay in the hospital. Will order BMP for tomorrow morning considering her being on Lasix and lisinopril.  2. Diabetes. Blood sugar seems fairly well controlled. At this time, I would continue her current management. Consider adding glipizide if needed, but her blood sugar seems fairly well controlled from 76 to 117.  3. Gastroesophageal reflux disease. Will add ranitidine at this time.  4. Morbid obesity. She was counseled for weight loss.  5. Schizophrenia. Management per psychiatry.  CODE STATUS: Full code.   TOTAL TIME TAKING CARE OF THIS PATIENT: 45 minutes.   ____________________________ Lucina Mellow. Manuella Ghazi, MD vss:lb D: 12/19/2012 11:03:47 ET T: 12/19/2012 12:43:38 ET JOB#: [id number]  cc: Shanley Furlough S. Manuella Ghazi, MD, <Dictator> Jolanta B. Bary Leriche, [place] MD ELECTRONICALLY SIGNED 12/19/2012 15:51

## 2014-06-14 NOTE — Consult Note (Signed)
Brief Consult Note: Diagnosis: DM, obesity, malignant HTN, p. HTN, LE edema, GERD, paranoid schizophrenia, h/o OSA, CHF, diastolic.   Patient was seen by consultant.   Consult note dictated.   Recommend further assessment or treatment.   Orders entered.   Comments: 1. Diabetes, can not tolerate metformin 2/2 diarrhea, get Hgb a1c , advance glipizide if needed, continue SSI 2. malignant HTn, add lisinopril, advance as eneded 3. LE swelling, likley OSA/p HTN related R sided heart failure, lasix  4. obesity, get Hgb a1c, TSH is OK, get llipid panel as well inam 5. GERD, zantac 6. OSA, was never given CPAP machine?, needfs SW follow up and poss setgting pt up with [facility] re: CPAP management 7. CHF, chronic, diastolic, get chest xray, O2 prn 8. paranoid schizophrenia, per psych, commited now Thanks for consult, we'll follow.  Electronic Signatures: Theodoro Grist (MD)  (Signed 18-Apr-14 14:52)  Authored: Brief Consult Note   Last Updated: 18-Apr-14 14:52 by Theodoro Grist (MD)

## 2014-06-14 NOTE — Discharge Summary (Signed)
PATIENT NAME:  [name] [name], [name] [name] MR#:  [id number] DATE OF BIRTH:  1946-01-21  DATE OF ADMISSION:  06/09/2012  DATE OF DISCHARGE:  06/23/2012  HOSPITAL COURSE:  See dictated History and Physical for details of admission. This 69 year old woman with a long history of schizophrenia was admitted to the hospital after becoming agitated in her community and disruptive in public. On admission here, she was paranoid and agitated. She would not talk about anything, except her belief that there were drug dealers living in her house. She would go on and on about it, and could not be distracted from the topic. The patient was initially resistant to medication, but became more compliant as the hospital course went on. Once she was taking medication a little more regularly, she became slightly easier to talk to, but she remains fixed in her delusions and also remains paranoid. She was loud and disruptive for several days, although I would point out that she was not actually physically threatening to anyone, and at no time voiced any suicidal ideation. The patient does not appear to be at the moment acutely dangerous to herself or others, although she continues to have psychiatric symptoms, which is typical for her. As far as we know, there has not been a period in a long time, if ever, in which she has been symptom-free. We attempted to reason with her for days about either going to a group home or going back to her previous residence. She absolutely refused to go to a group home, and continued to maintain that there were drug dealers at her home. She tried various delusional tactics to delay discharge, including insisting that several distant relatives were trying to find her houses. When we followed up on all of these, we discovered that they were untrue. At this point, patient appears to be at her baseline, no longer benefiting from inpatient psychiatric hospitalization. She has been treated with Geodon orally primarily,  although she also got a Prolixin decanoate shot this past week. The patient really needs followup treatment in the community for her mental health issues, and will be referred back to community mental health, where she has an appointment with [facility] coming up as a walk-in.   DISCHARGE MEDICATIONS:  Coreg 25 mg b.i.d., Klonopin 0.5 mg 3 times a day, Lasix 40 mg a day, Zestril 20 mg per day, Zantac 150 mg q. 12 hours, Geodon 80 mg q. 8 hours with meals. I reminded her very clearly that she had to take it with food. Aspirin 81 mg per day, glipizide 10 mg b.i.d. She will not need to use any insulin.   LABORATORY RESULTS:  Admission labs showed drug screen negative, TSH normal at 1.6, alcohol undetectable. Chemistries slightly high, glucose 130. AST elevated at 56, total protein elevated at 8.6. Hemoglobin A1c 9.1. CBC was normal, except for a very slightly elevated RBC count at 5.49. Urinalysis unremarkable. Chest x-ray normal.   MENTAL STATUS EXAM AT DISCHARGE:  Obese, slightly disheveled woman, looks her stated age. Passively cooperative with the interview. Good eye contact. Psychomotor activity mostly calm. Speech is decreased in total amount, loud when she does speak. Affect is constricted and flattened and somewhat anxious. Mood is stated as being okay. The patient denies suicidal or homicidal ideation. Denies hallucinations. She still has some baseline paranoia, which is probably fixed, about her home community. She is not showing acute agitation, violence or threatening behavior. She shows improved insight, and agrees to take her medicine. Baseline intelligence average  to low-average, with impairment from psychosis.   DISPOSITION:  As noted above.   DIAGNOSIS, PRINCIPAL AND PRIMARY:  AXIS I:  Schizophrenia, undifferentiated.   SECONDARY DIAGNOSES: AXIS I:  No further.   AXIS II:  Deferred.   AXIS III:  Obesity, diabetes, hypertension, gastric reflux symptoms.    AXIS IV:  Severe from chronic  social isolation and burden of illness.   AXIS V:  Functioning at time of discharge 28.     ____________________________ Gonzella Lex, MD jtc:mr D: 06/23/2012 13:44:35 ET T: 06/23/2012 22:59:32 ET JOB#: [id number]  cc: Gonzella Lex, MD, <Dictator> Gonzella Lex MD ELECTRONICALLY SIGNED 06/24/2012 14:26

## 2014-06-14 NOTE — Discharge Summary (Signed)
PATIENT NAME:  [name] [name], [name] [name] MR#:  [id number] DATE OF BIRTH:  04-02-1945  DATE OF ADMISSION:  12/17/2012 DATE OF DISCHARGE:  12/18/2012  DISCHARGED TO:  Behavioral Health Unit with involuntary commitment.   DISCHARGE DIAGNOSES: 1.  Acute encephalopathy secondary to medications.  2.  Acute hypoxic respiratory failure secondary to sedative medications.  3.  Accelerated hypertension.  4.  Schizophrenia with psychosis.   CONSULTANTS: Psychiatry.   IMAGING STUDIES DONE: Include a chest x-ray which showed shallow inspiration, no acute cardiopulmonary disease.   ADMITTING HISTORY AND PHYSICAL: Please see detailed H and P dictated by Dr. Posey Pronto. In brief, a 69 year old African American female patient admitted to the [facility] with psychosis, agitation, on multiple sedative medications, who was seen by the on-call hospitalist for poor responsiveness. The patient was transferred to medical floor for stabilization and treatment. The patient's psychiatric medications have been stopped along with benzodiazepines. The patient has returned back to baseline although she still continues to have schizophrenia with psychosis. Lisinopril has been added to her blood pressure medications along with low-dose Lasix, which will be continued. The patient will be discharged back to [facility].   Today, on examination, the patient still has confusion. Lungs sound clear. Cardiac examination is normal.   DISCHARGE MEDICATIONS: Include:  1.  Clonidine 0.3 patch transdermal once a week.  2.  Acetaminophen 325 mg 2 tablets oral every 4 hours as needed for pain or fever.  3.   200 mg daily.  4.  Coreg 25 mg oral 2 times a day.  5.  Lasix 20 mg oral once a day.  6.  Tolterodine 4 mg oral extended-release once a day.  7.  Lisinopril 10 mg oral once a day.   DISCHARGE INSTRUCTIONS: The patient will continue low-sodium, low-fat diet. Activity as tolerated. Continue her care downstairs in [facility] per psychiatry.   TIME SPENT ON DAY OF DISCHARGE: In discharge activity was 40 minutes.    ____________________________ Leia Alf Adesuwa Osgood, MD srs:cs D: 12/18/2012 14:30:32 ET T: 12/18/2012 15:04:22 ET JOB#: [id number]  cc: Alveta Heimlich R. Oniel Meleski, MD, <Dictator> Neita Carp MD ELECTRONICALLY SIGNED 12/20/2012 13:11

## 2014-06-14 NOTE — Consult Note (Signed)
PATIENT NAME:  [name] [name], [name] [name] MR#:  [id number] DATE OF BIRTH:  1945-04-02  DATE OF CONSULTATION:  12/17/2012  REFERRING PHYSICIAN:   CONSULTING PHYSICIAN:  Oniel Meleski H. Posey Pronto, MD  REASON FOR CONSULTATION:  Opinion regarding the patient's uncontrolled blood pressure, diabetes, GERD.   HISTORY OF PRESENT ILLNESS: The patient is a 69 year old African American female who is admitted with psychosis, agitation, disorganized. She has been confused and has had significant paranoid thinking. She does have a history of hypertension and diabetes. Apparently, she states that the therapist whom she was working with told her that she does not need to take her medications, so she stopped taking all her medications. Blood pressure since admission has been accelerated. On arrival, it was normal, but since the admission, her blood pressure systolic and diastolic have been poorly controlled. Therefore, we were asked to see the patient. She is already on clonidine TTS-1 patch. She is also receiving Coreg 25 b.i.d. and Lasix 40 daily. The patient is a very poor historian, but is able to communicate. According to the nurses, she has been having dyspnea on exertion and has been having lower extremity swelling that has gotten worse. The patient denies any chest pain or palpitations. Denies any abdominal pain, nausea, vomiting or diarrhea.   PAST MEDICAL HISTORY AND PSYCHIATRIC HISTORY: Include multiple hospitalizations in psychiatry for schizophrenia, morbid obesity, diabetes, hypertension and GERD.    ALLERGIES: HALOPERIDOL AND METFORMIN.   MEDICATIONS ON ADMISSION: Included lisinopril 20 daily, glipizide 10 b.i.d., aspirin 81 mg 1 tab p.o. daily, Geodon 60 mg q. 8 for psychosis, clonazepam 0.5 three times a day, carvedilol 25 mg 1 tab p.o. b.i.d., Lasix 40 daily, ranitidine 150 b.i.d.   SOCIAL HISTORY: She lives with her son. Denies alcohol abuse. Denies any nicotine abuse. Denies any drug use.   FAMILY HISTORY:   Positive for hypertension.   REVIEW OF SYSTEMS: Limited based on the patient being a poor historian.  CONSTITUTIONAL: Denies any fevers or chills. Denies any weight loss or weight gain.  EYES: Denies any blurred vision or double vision. No glaucoma. No cataract.  ENT: Denies any epistaxis. No hearing loss. No ringing in the ears. No difficulty swallowing.  RESPIRATORY: Does complain of some shortness of breath with exertion, but no cough. No history of pneumonia. No COPD. No asthma.  CARDIOVASCULAR: Denies any chest pain, orthopnea. Complains of edema. No palpitations. No syncope.  GASTROINTESTINAL: Denies any abdominal pain, nausea, vomiting or diarrhea.  GENITOURINARY: Denies any frequency, urgency or hesitancy.  ENDOCRINE: Denies heat or cold intolerance. No polydipsia, polyphagia.  LYMPHATICS: Denies any lymph node enlargement.  HEMATOLOGIC: Denies any anemia or easy bruisability.  SKIN: Denies any rash.  MUSCULOSKELETAL: Denies any muscle swelling or joint aches.  NEUROLOGICAL: Denies any CVA, TIA or seizures.  PSYCHIATRIC: She has schizophrenia.   PHYSICAL EXAMINATION: VITAL SIGNS: Temperature 98.5, pulse 76, respirations 22, blood pressure 184/109, O2 at 96%.  GENERAL: The patient is a morbidly obese African American female, appears disheveled, but in no acute distress.  HEENT: Pupils are equally round, react to light and accommodation. There is no conjunctival pallor. No scleral icterus. Muddy sclera. Nasal exam shows no drainage or ulceration. External ear exam shows no erythema or drainage.  NECK: Supple. No thyromegaly. No carotid bruits.  LUNGS: Clear to auscultation bilaterally without any rales, rhonchi, wheezing. No accessory muscle usage.  HEART: Regular rate and rhythm. No murmurs, rubs, clicks or gallops.  ABDOMEN: Soft, nontender, nondistended. Positive bowel sounds x 4. No  hepatosplenomegaly.  MUSCULOSKELETAL: There is no swelling or erythema.  SKIN: No rashes or  bruises.  LYMPHATICS: No lymph nodes palpable.  NEUROLOGICAL: Cranial nerves II to XII grossly intact. No focal deficit.  PSYCHIATRIC: The patient is disheveled, has tangential ideas. Denies suicidal or homicidal ideation.   LABORATORY DATA:  Her BMP on October 24th: Glucose was 101, BUN 13, creatinine 1.14, sodium 142, potassium 4.0, chloride 102, CO2 was 37. LFTs were normal. WBC 9.2, hemoglobin 12.4, platelet count 217.   ASSESSMENT AND PLAN: The patient is a 69 year old, admitted to behavioral medicine with schizophrenia exacerbation with psychosis. Noted to have accelerated hypertension.  1.  Accelerated hypertension. At this time, the patient also has concurrent lower extremity swelling, so we will go ahead and increase her Lasix from 40 daily to b.i.d., which also should help with her blood pressure. She is on a clonidine patch which he is TTS-1, which we will change to TTS-3. Would recommend continuing Coreg. I will also add p.r.n. p.o. hydralazine. We will check a BMP and follow BMP every few days, in light of increased Lasix dose.  2.  Diabetes. Blood glucose seems to be under good control. Continue glipizide. We will monitor her closely, make sure she does not have any hypoglycemic episodes.  3.  Gastroesophageal reflux disease. Continue ranitidine as being currently prescribed.  4.  Morbid obesity. The patient needs to lose weight, but in light of her current situation and mental illness, she will likely be noncompliant with her diet and exercise.  5.  Schizophrenia. Per psychiatry.   TIME SPENT ON THIS CONSULT: 50 minutes.     ____________________________ Lafonda Mosses Posey Pronto, MD shp:cs D: 12/17/2012 10:48:00 ET T: 12/17/2012 15:10:25 ET JOB#: [id number]  cc: Rayah Fines H. Posey Pronto, MD, <Dictator>   Alric Seton MD ELECTRONICALLY SIGNED 12/19/2012 12:13

## 2014-06-14 NOTE — Consult Note (Signed)
9 y f with uncontrolled hypertension uncontrolled blood pressure: increase the lasix to bid (considering LE swelling), previous echocardiogram showed normal EF (greater than 55%), continue ACE-I and b-blocker, increase clonidine also, prn clonidine for better blood pressure control. check bmp in am diabetes mellitus: well controlled, consider glipizide if sugars go high but no need for now Gastroesophageal reflux disease: start ranitidine morbid obesity: counselled code  Electronic Signatures: Remer Macho (MD)  (Signed on 28-Oct-14 11:07)  Authored  Last Updated: 28-Oct-14 11:07 by Remer Macho (MD)
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
## 2014-06-16 NOTE — Consult Note (Signed)
PATIENT NAME:  [name] [name], [name] [name] MR#:  [id number] DATE OF BIRTH:  [birth year]  DATE OF CONSULTATION:  05/12/2011  REFERRING PHYSICIAN:  Francene Castle, MD  CONSULTING PHYSICIAN:  Drue Stager. Holbrook, MD  REASON FOR CONSULTATION: Mania.   HISTORY OF PRESENT ILLNESS: [name] [name] is a 69 year old female presenting to the Emergency Department on March 18th with expansive mood, pressured speech, racing thoughts, intrusive behavior as well as a bizarre delusional system. She has expressed this delusion in the past, however, it has returned to high intensity. The delusion involves a fixed belief that her ex-boyfriend was poisoned by a certain individual. [name] [name] does not have any thoughts of killing herself or anyone else, however, she mentions that individual by name. She also has been calling the police about drug dealers in her front yard.   She states that she has been calling the police every day about drug dealers in her front yard, however, this is not confirmed. Her son did take out involuntary commitment papers because of her manic exacerbation. The duration of this manic episode appears to be approximately five days with escalation to this point.   PAST PSYCHIATRIC HISTORY: [name] [name] does have a long-term history of recurrent manic episodes. This delusional theme has existed in prior manic episodes. She has been admitted to [facility]. She has two admissions to [facility] in her history. Two of them were last year.   FAMILY PSYCHIATRIC HISTORY: None known.   SOCIAL HISTORY: [name] [name] lives alone. She denies any alcohol or substance abuse. She is medically disabled and is not employed.   HOME MEDICATIONS:  1. Vicodin 500/5 q.4 hours p.r.n.  2. Motrin 800 mg q.8 hours. 3. Hydrochlorothiazide/lisinopril 12.5/20 mg daily. 4. Omeprazole 20 mg daily.  5. Metformin 500 mg daily. 6. Lasix 20 mg daily.  7. Amlodipine 10 mg daily. 8. Lorazepam 1 mg at bedtime. 9. Trazodone 100 mg  daily. 10. Aspirin 81 mg daily.  11. The patient also receives a Prolixin decanoate shot every two weeks.   ALLERGIES: Haldol.   LABORATORY DATA: Urine drug screen negative. TSH mildly elevated at 6.92. Aspirin unremarkable. WBC 11.4. Hemoglobin and platelet count normal. Ethanol negative. Creatinine is mildly elevated at 1.43. BUN 10. Tylenol negative. Urinalysis unremarkable.    PHYSICAL EXAMINATION:   VITAL SIGNS: Temperature 96.7, pulse 74, respiratory rate 20, blood pressure 188/114.   She is currently undergoing further evaluation and treatment in the Emergency Room.   GENERAL APPEARANCE: [name] [name] is an elderly female appearing approximately 52 years younger than her chronologic age with no abnormal involuntary movements. She alternates between sitting and standing in her room. At times she is pacing back and forth. She is redirected back to her Emergency Room after intruding on conversations in the hallway. She has normal muscle tone. Her grooming and hygiene are normal.   [name] [name] is alert. Her eye contact is intact. She is oriented to all spheres. Memory testing 3 out of 3 immediate; 3 out of 3 at five minutes. Remote is grossly intact. Fund of knowledge, intelligence, and use of language are mildly below average. Her speech is pressured. There is no dysarthria. Thought process does involve racing thoughts with some circumferentiality. Thought content delusional as described above. No thoughts of harming herself or others. No hallucinations. Affect is expansive. Mood is mildly euphoric. Her insight is poor. Her judgment is impaired.   ASSESSMENT:  AXIS I: Bipolar I disorder, manic, with psychotic features, rule out schizoaffective disorder.  AXIS II: Deferred.   AXIS III: See past medical history. The patient does have a history of high blood pressure and diabetes mellitus.   AXIS IV: General medical, primary support group.   AXIS V: 25.   [name] [name] has impaired judgment  secondary to her manic psychotic state and would be at risk for self neglect, potentially lethal self neglect, if not inside the supportive environment of a hospital.   RECOMMENDATIONS:  1. She will need to be admitted to an Inpatient Psychiatric Unit.  2. She has received her Prolixin dose today. 3. Would utilize Ativan 1 to 2 mg p.o. or IM q. 4 to 6 hours p.r.n. agitation.  4. Would provide low stimulation ego supportive psychotherapy.  5. She is awaiting admission to an inpatient psychiatric unit for further evaluation and treatment. She is under involuntary commitment.   ____________________________ Drue Stager. Sunjai Levandoski, MD jsw:drc D: 05/12/2011 13:25:04 ET T: 05/12/2011 14:03:45 ET JOB#: [id number]  cc: Drue Stager. Emelly Wurtz, MD, <Dictator> Billie Ruddy MD ELECTRONICALLY SIGNED 05/15/2011 20:46

## 2014-06-18 LAB — BASIC METABOLIC PANEL
ANION GAP: 7 (ref 7–16)
BUN: 9 mg/dL
CALCIUM: 9 mg/dL
Chloride: 102 mmol/L
Co2: 33 mmol/L — ABNORMAL HIGH
Creatinine: 0.88 mg/dL
EGFR (Non-African Amer.): >60
GLUCOSE: 219 mg/dL — AB
Potassium: 3.6 mmol/L
SODIUM: 142 mmol/L

## 2014-06-18 LAB — CBC
HCT: 41.5 % (ref 35.0–47.0)
HGB: 13.2 g/dL (ref 12.0–16.0)
MCH: 27.4 pg (ref 26.0–34.0)
MCHC: 31.7 g/dL — ABNORMAL LOW (ref 32.0–36.0)
MCV: 86 fL (ref 80–100)
Platelet: 279 10*3/uL (ref 150–440)
RBC: 4.81 10*6/uL (ref 3.80–5.20)
RDW: 14 % (ref 11.5–14.5)
WBC: 11.1 10*3/uL — AB (ref 3.6–11.0)

## 2014-06-18 LAB — TROPONIN I: Troponin-I: 0.03 ng/mL

## 2014-06-18 LAB — PRO B NATRIURETIC PEPTIDE: B-TYPE NATIURETIC PEPTID: 28 pg/mL

## 2014-06-19 ENCOUNTER — Inpatient Hospital Stay
Admit: 2014-06-19 | Disposition: A | Discharge status: Home / Self Care | Payer: Self-pay | Attending: Internal Medicine | Admitting: Internal Medicine

## 2014-06-19 LAB — CK-MB
CK-MB: 3.6 ng/mL
CK-MB: 3.6 ng/mL

## 2014-06-19 LAB — TROPONIN I
Troponin-I: 0.03 ng/mL
Troponin-I: <0.03 ng/mL

## 2014-06-20 LAB — HEMOGLOBIN A1C: Hemoglobin A1C: 9.7 % — ABNORMAL HIGH

## 2014-06-20 LAB — TSH: Thyroid Stimulating Horm: 4.01 u[IU]/mL

## 2014-06-23 NOTE — H&P (Addendum)
PATIENT NAME:  [name] [name], [name] [name] MR#:  [id number] DATE OF BIRTH:  07/23/1945  DATE OF ADMISSION:  06/19/2014  REFERRING PHYSICIAN:  Elta Guadeloupe R. Jacqualine Code, MD    PRIMARY CARE DOCTOR:  Mikeal Hawthorne. Brynda Greathouse, MD    ADMIT DIAGNOSIS:  Respiratory failure with hypercapnia.   HISTORY OF PRESENT ILLNESS: This is a 69 year old African American female who presents to the Emergency Department complaining of shortness of breath. The patient states that she has been more dyspneic for a number of weeks now. She has been coughing a lot lately. She also admits to some right-sided chest pain prior to admission. She is no longer having any chest pain. She also states that her ankles have been swelling lately. In the Emergency Department, she was very confused and agitated. She described some argument with her son who wants to place her in a nursing home, which has upset her tremendously and she states has contributed to her shortness of breath. In the Emergency Department, the patient was found to be slightly hypercarbic and requiring BiPAP which prompted the Emergency Department to call for admission.   REVIEW OF SYSTEMS:  CONSTITUTIONAL: The patient denies fever or weakness.  EYES: Denies blurred vision or inflammation.  EARS, NOSE AND THROAT: Denies tinnitus or sore throat.  RESPIRATORY: Admits to cough and shortness of breath.  CARDIOVASCULAR: Admits to chest pain not at the moment. She denies palpitations but admits to orthopnea. She sleeps on 4 pillows at night.  GASTROINTESTINAL: Denies nausea, vomiting, diarrhea, or abdominal pain.  GENITOURINARY: Denies dysuria, increased frequency, or hesitancy of urination.  ENDOCRINE: Denies polyuria or polydipsia.  HEMATOLOGIC AND LYMPHATIC: Denies easy bruising or bleeding.  INTEGUMENTARY: Denies rashes or lesions.  MUSCULOSKELETAL: The patient denies arthralgias or myalgias.  NEUROLOGIC: Denies numbness in her extremities or dysarthria.  PSYCHIATRIC: Denies depression or  suicidal ideation.   PAST MEDICAL HISTORY: Congestive heart failure, obstructive sleep apnea, gastroesophageal reflux disease, hypertension, diabetes type 2, and mental illness.   SURGICAL HISTORY: Cholecystectomy, right total knee replacement, and tonsillectomy with adenoidectomy.   SOCIAL HISTORY: The patient lives alone. She does not smoke, drink, or do any drugs. She admits that she was a former smoker.   FAMILY HISTORY: Significant for hypertension. Also her mother is deceased of coronary artery disease.   MEDICATIONS:  1.  Aspirin 81 mg 1 tablet p.o. daily.  2.  Atorvastatin 20 mg 1 tablet p.o. at bedtime.  3.  Benztropine 1 mg 1 tablet p.o. b.i.d.  4.  Fluphenazine  10 mg 1 tablet p.o. at bedtime.  5.  Glipizide 10 mg 1 tablet p.o. b.i.d.  6.  Lantus 15 units subcutaneously at bedtime.  7.  Januvia 100 mg 1 tab p.o. daily.  8.  Lisinopril 5 mg 1 tab p.o. daily.  9.  Lorazepam 0.5 mg 1 tablet p.o. t.i.d. as needed for anxiety.  10.  Olanzapine 15 mg disintegrating tablet, 1 tablet sublingually b.i.d.  11.  Oxybutynin extended release 10 mg per 24 hour tablet 1 tablet p.o. daily.  12.  Prednisone 60 mg tapering dose by 10 mg daily until complete.  13.  Ranitidine 150 mg 1 tablet p.o. b.i.d.  14.  Trazodone 100 mg 1 tablet p.o. at bedtime as needed for sleep.  15.  Verapamil 40 mg 1 tab p.o. every 6 hours.   ALLERGIES: HALDOL AND METFORMIN.   PERTINENT LABORATORY RESULTS AND RADIOGRAPHIC FINDINGS: Serum glucose is 219, BNP is 28, BUN is 9, creatinine 0.88, serum sodium is 142,  potassium is 3.6, chloride 102, bicarbonate 33, calcium is 9, troponin is negative. White blood cell count is 11.1, hemoglobin is 13.2, hematocrit 41.5, platelet count is 279,000, MCV is 86. ABG shows pH of 7.37, pCO2 of 58, pO2 of 79 on FiO2 of 28% with a base excess of 6. Chest x-ray shows no active cardiopulmonary disease.   PHYSICAL EXAMINATION:  VITAL SIGNS: Temperature is 98.2, pulse is 95,  respirations 15, blood pressure is 156/100, pulse oximetry is 96% on 28% oxygen.  GENERAL: The patient is alert and oriented to person and place. She is in mild distress given her mild tachypnea (respirations were 22 to 24) as well as being emotionally upset.  HEENT: Normocephalic, atraumatic. Pupils equal, round, and reactive to light and accommodation. Extraocular movements are intact. Mucous membranes are moist.  NECK: Trachea is midline. No adenopathy. Thyroid is nonpalpable and nontender.  CHEST: Symmetric and atraumatic.  CARDIOVASCULAR: Regular rate and rhythm. Normal S1, S2. No rubs, clicks, or murmurs appreciated.  LUNGS: Clear to auscultation bilaterally. The patient has a BiPAP mask in place.  ABDOMEN: Positive bowel sounds. Soft, nontender, nondistended. No hepatosplenomegaly.  GENITOURINARY: Deferred.  MUSCULOSKELETAL: The patient moves all 4 extremities equally. There is 5 out of 5 strength in upper and lower extremities bilaterally. I have not observed the patient's gait.  SKIN: Warm and dry. No rashes or lesions.  EXTREMITIES: No clubbing, cyanosis, or edema.  NEUROLOGIC: Cranial nerves II through XII are grossly intact.  PSYCHIATRIC: Mood is normal. Affect is congruent. It is difficult to assess the patient's judgment and insight into her medical condition as she is very emotional and tearful at this time. She denies any suicidal ideation or hallucinations.   ASSESSMENT AND PLAN: This is a 68 year old female admitted for respiratory failure with hypercapnia.  1.  Respiratory failure. The patient's pCO2 is 58, which is elevated, but most likely a level at which she is used to given her chronic respiratory issues. She has no acidosis at this time and she is comfortable on BiPAP. She has a history of noncompliance with her nocturnal CPAP which may have contributed to the situation but I think she may have worked herself up into some respiratory failure due to this argument with her son.  We will treat her as an exacerbation of COPD. I will start Levaquin and prednisone taper.  2.  Congestive heart failure. The patient has a preserved ejection fraction of 60%. Her wall motion abnormalities are during diastole. She was given Lasix in the Emergency Department but she has no evidence of exacerbation at this time. Her lungs are clear.  3.  Obstructive sleep apnea. We will continue the patient on BiPAP.  4.  Hypertension. We will continue lisinopril and verapamil.  5.  Diabetes type 2. We will continue basal insulin coverage as well as sliding scale insulin.  6.  Mental illness. It appears the patient may have schizophrenia. We will continue fluphenazine and olanzapine. She does not appear to be a danger to herself.  7.  Deep vein thrombosis prophylaxis, Lovenox.  8.  Gastrointestinal prophylaxis, none as the patient is not critically ill.   CODE STATUS: The patient is a full code.   TIME SPENT ON ADMISSION ORDERS AND PATIENT CARE: Approximately 45 minutes.    ____________________________ Norva Riffle. Marcille Blanco, MD msd:AT D: 06/19/2014 03:37:32 ET T: 06/19/2014 05:43:47 ET JOB#: [id number]  cc: Norva Riffle. Marcille Blanco, MD, <Dictator> Norva Riffle Pax Reasoner MD ELECTRONICALLY SIGNED 06/25/2014 17:58

## 2014-06-23 NOTE — Consult Note (Signed)
PATIENT NAME:  [name] [name], TABOR MR#:  [id number] DATE OF BIRTH:  09/11/1945  DATE OF CONSULTATION:  05/06/2014  REFERRING PHYSICIAN:   CONSULTING PHYSICIAN:  Allyne Gee, MD  PULMONARY CONSULTATION  REASON FOR CONSULTATION: Acute on chronic respiratory failure.   HISTORY OF PRESENT ILLNESS: A 69 year old [place] American female, patient of Dr. Brynda Greathouse, who presents to the hospital with increasing shortness of breath. The patient has a history of sleep apnea and is noncompliant with recommended therapy. She was having increasing shortness of breath, some cough, and congestion. She has no chest pain noted. She did not have any fever or chills. She had been recently in the hospital with similar respiratory failure. When she was seen, she was awake, appeared to be comfortable without distress.    PAST MEDICAL HISTORY: Significant for diastolic heart failure with an EF of 60%, obstructive sleep apnea, hypopnea syndrome, noncompliant. She also has a history of hypertension, gastroesophageal reflux, type 2 diabetes.   SOCIAL HISTORY: Does not use drugs or drink or smoke.   FAMILY HISTORY: Significant only for hypertension.   ALLERGIES: HALDOL AND METFORMIN.   REVIEW OF SYSTEMS: Complete 12-point review of systems was performed and were negative other than what is noted above in the HPI.   PHYSICAL EXAMINATION:  VITAL SIGNS: Temperature was 98, pulse of 96, respiratory rate 24, blood pressure 150/81, saturations are 96%, that is on 2 liters nasal cannula.  NECK: Supple, short. No JVD. No adenopathy.  HEENT: Extraocular movements are intact.  CHEST: Coarse breath sounds, a few rhonchi, no rales. Expansion was equal.  CARDIOVASCULAR: S1, S2 normal. Regular rhythm. No gallop or rub.  ABDOMEN: Soft, nontender, and obese.  NEUROLOGIC: She was awake, appeared to be comfortable.   LABORATORY DATA: White count 11.1, hemoglobin 12.3, hematocrit 39.7. The chemistry shows glucose of 196, BUN 21,  creatinine 0.98, sodium 138, potassium 4.2.   The chest x-ray on admission shows negative pneumonia or infiltrate.   IMPRESSION:  1.  Acute on chronic respiratory failure, on bilevel positive airway pressure.  2.  Noncompliance with medical therapy.   PLAN: Her initial ABG was 7.32, pCO2 of 70, pO2 of 167. We will encourage use of BiPAP. We will continue with aggressive bronchodilators. No current acute infiltrate noted on the chest films. We will monitor. Further recommendations as necessary.    ____________________________ Allyne Gee, MD sak:ts D: 05/06/2014 21:06:00 ET T: 05/06/2014 21:30:38 ET JOB#: [id number]  cc: Allyne Gee, MD, <Dictator> Allyne Gee MD ELECTRONICALLY SIGNED 05/09/2014 23:27

## 2014-06-23 NOTE — Discharge Summary (Signed)
PATIENT NAME:  [name] [name], WOOLEY MR#:  [id number] DATE OF BIRTH:  1945-03-28  DATE OF ADMISSION:  04/21/2014 DATE OF DISCHARGE:  04/29/2014  DISCHARGE DIAGNOSES: 1.  Altered mental status due to hypercarbia.  2.  Acute hypercarbic respiratory failure.  3.  Diastolic congestive heart failure.  4.  Acute renal failure.   MEDICATIONS ON DISCHARGE:  2.  Fluphenazine 10 mg oral once a day.  3.  Glipizide 10 mg oral 2 times a day.  4.  Oxybutynin once a day.  5.  Atorvastatin 20 mg oral once a day.  6.  Januvia 100 mg oral once a day.  7.  Aspirin 81 mg once a day.  8.  Isosorbide mononitrate 30 mg oral once a day.  9.  Lorazepam 50 mg oral 3 times a day.  10.  Olanzapine 15 mg oral disintegrating 2 times a day. 11.  Benztropine 1 mg oral 2 times a day.  12.  Trazodone 100 mg once a day.  13.  Lisinopril 5 mg once a day.  14.  Prednisone 10 mg once a day.  15.  Insulin glargine 15 units subcutaneous once a day.  DISCHARGE INSTRUCTIONS: Advised to have oxygen on discharge 2 L nasal cannula and regular consistency diet.   FOLLOWUP: Advised to follow within 1 to 2 weeks in cardiology clinic, Dr. Ida Rogue.   HISTORY OF PRESENTING ILLNESS: A 69 year old African American female with past history of CHF, hypertension, diabetes. She was sent from home because of severe shortness of breath and confusion. She was placed on BiPAP, as she had elevated CO2 level. She was just discharged 3 days ago with treatment of acute congestive heart failure and was given several new prescriptions and she was not on oxygen at home. She still had some shortness of breath after discharge. Chest x-ray showed bilateral pneumonia or pulmonary edema, precipitating event and Zosyn and given to hospitalist team for further management.  HOSPITAL COURSE AND STAY:  1.  Altered mental status: Mostly it was toxic metabolic encephalopathy due to elevated CO2 level, which resolved. Initially required some BiPAP, then we  continued CPAP at bedtime and the patient remained alert and oriented.  2.  Acute renal failure, most likely it was due to acute tubular necrosis and prerenal. After stopping her diuresis, there was improvement in her kidney function. Blood pressure was fluctuating in hospital, but we continued lisinopril. Renal ultrasound was done. There was some renal mass found the patient said her primary care doctor was knowing about that doing regular scans as per her. As her primary care was following and taking care of that- no further work ups done here. 3.  Acute respiratory failure, initially thought because of CHF exacerbation and cardiology denied for that. The patient had some wheezing, but it improved. We stopped Lasix. As per cardiology, the patient had significant hypertrophic cardiomyopathy and she was not a candidate for diuretics because of decrease in renal function in this patient. Initially also told that she had some pneumonia, so started on antibiotic, but stopped that and the patient remained stable. Gradually she came on room air and we were able to be discharge her.  4.  Schizoaffective disorder. Psych consult was called in and they suggested medication. We just continued that in the hospital. The patient remained completely alert and calm.  5.  Hypertension. Blood pressure monitored carefully. Held clonidine and lisinopril in the hospital.  6.  Diabetes. Continue Januvia and sliding scale coverage later on.  IMPORTANT CONSULTS IN THE HOSPITAL: Dr. [name] for nephrology, Dr. Alethia Berthold for psychiatry.   IMPORTANT LABORATORY RESULTS: On presentation February 28, troponin was negative. WBC 15.2, hemoglobin 13.8, and platelet count 250,000.  Blood culture did not grow any bacteria. CT scan of the head without contrast on admission was done for altered mental status. Did not show any acute abnormality. BNP was 119. Creatinine came down to 1.4 on February 29. On ABG, pH was 7.3, pCO2 of 69,  and pO2 was 61. On further followup March 4, creatinine 3.70, potassium was 5. Urinalysis was negative. CT scan of the abdomen and pelvis without contrast was done, which showed renal mass. Creatinine on March 6 was 1.25; on March 7 was 1.03.  TOTAL TIME SPENT ON THIS DISCHARGE: 40 minutes.   ____________________________ Ceasar Lund Anselm Jungling, MD vgv:sw D: 05/02/2014 21:47:35 ET T: 05/03/2014 07:47:33 ET JOB#: [id number]  cc: Ceasar Lund. Anselm Jungling, MD, <Dictator> Minna Merritts, MD Mikeal Hawthorne Brynda Greathouse, MD  Vaughan Basta MD ELECTRONICALLY SIGNED 05/09/2014 11:50

## 2014-06-23 NOTE — Consult Note (Signed)
Brief Consult Note: Diagnosis: schizophrenia.   Patient was seen by consultant.   Consult note dictated.   Orders entered.   Comments: Psychiatry: Patient seen and chart reviewed and note dictated. Patient with schizophrenia. It looks like her zyprexa, which has been part of her treatment long term, was overlooked this admission. Will restart though at a lower dose of 10mg  bid rather than 15mg  bid initially to not oversedate.  Electronic Signatures: Gonzella Lex (MD)  (Signed 03-Mar-16 16:02)  Authored: Brief Consult Note   Last Updated: 03-Mar-16 16:02 by Gonzella Lex (MD)

## 2014-06-23 NOTE — H&P (Signed)
PATIENT NAME:  [name] [name], POLYAKOV MR#:  [id number] DATE OF BIRTH:  March 17, 1945  DATE OF ADMISSION:  04/21/2014  PRIMARY CARE PHYSICIAN: Jaquelyn Bitter B. Brynda Greathouse, MD  REFERRING PHYSICIAN: Baird Cancer. Quale, MD  CHIEF COMPLAINT: Shortness of breath and confusion, 1 day.   HISTORY OF PRESENT ILLNESS: A 69 year old African American female with a history of CHF, hypertension, diabetes, was sent from home due to shortness of breath and confusion today. The patient is confused, placed on BiPAP, unable to answer questions. According to the patient's son, who is the health power of attorney, the patient was just discharged 3 days ago after treatment of acute CHF. The patient was given several new prescriptions. In addition, the patient is not on oxygen at home. The patient still had shortness of breath after discharge. The patient was found unresponsive and confused on the floor at home today, so patient was sent to the ED for further evaluation. The patient has respiratory distress, is placed on BiPAP. Chest x-ray shows bilateral pneumonia and pulmonary edema. The patient was treated with vancomycin and Zosyn in the ED.   PAST MEDICAL HISTORY: Hypertension, diabetes, schizophrenia, sleep apnea, chronic peripheral edema, GERD, recent diagnosis of CHF, ejection fraction of 60% to 65%.  SOCIAL HISTORY: No smoking or drinking or illicit drugs.   FAMILY HISTORY: Hypertension.   PAST SURGICAL HISTORY: No.  REVIEW OF SYSTEMS: Unable to obtain due to patient's confusion status.  ALLERGIES: HALDOL, METFORMIN.   HOME MEDICATIONS: Medication reconciliation is not done yet. Will update later.   PHYSICAL EXAMINATION:  VITAL SIGNS: Temperature 97.5, blood pressure 110/64, oxygen saturation 100% on BiPAP, respiration rate 24.  GENERAL: The patient continues unable to communicate on BiPAP. Critically ill looking.  HEENT: The patient is not cooperating, not following commands. Unable to do oral and eye exam, but no discharge from  nose, ear, or eyes. NECK: Supple. No JVD or carotid bruit. No lymphadenopathy, no thyromegaly.  CARDIOVASCULAR: S1 and S2. Regular rate. rhythm. No murmurs or gallops.  PULMONARY: Bilateral air entry. No wheezing or rales. No use of accessory muscles. Good bilateral air entry. No wheezing, but has bilateral basilar rales. No use of accessory muscles to breathe.  ABDOMEN: Soft. No distention or tenderness. No organomegaly. Bowel sounds present.  EXTREMITIES: Bilateral lower extremity edema 1+. No clubbing or cyanosis. No calf tenderness. Bilateral pedal pulses present.  SKIN: No rash or jaundice.  NEUROLOGIC: The patient is confused, not following commands. Unable to examine at this time.   LABORATORY DATA: CAT scan of head without contrast: No acute intracranial abnormality. INR 1.1. WBC 15.2, hemoglobin 13.8, platelets 250,000. Glucose 271, BUN 29, creatinine 1.64. Electrolytes normal. BNP 119.  ABG showed pH of 7.36, pCO2 of 62, pO2 of 52, with FiO2 28%. Lactic acid 1.5. Troponin less than 0.02.   IMPRESSIONS:  1.  Acute respiratory failure.  2.  Bilateral pneumonia with leukocytosis.  3.  Sepsis.  4.  Altered mental status possibly due to respiratory failure and pneumonia causing acute encephalopathy. 5.  Acute diastolic congestive heart failure.  6.  Hypertension.  7.  Diabetes.  8.  Acute renal failure.  9.  Lactic acidosis.   PLAN OF TREATMENT: 1.  The patient will be admitted to step-down unit. Will continue BiPAP, give DuoNeb, start antibiotics including Zosyn, vancomycin, and Zithromax. We will follow up sputum culture, blood culture, and CBC.  2.  For acute diastolic CHF, the patient's diuretic was discontinued after discharge due to left ventricular outlet obstruction.  Since the patient has worsening shortness of breath, will resume Lasix and get a cardiology consult.  3.  For acute renal failure, will monitor BMP.  4.  For hypertension, continue patient's home medication  except for diabetes, start sliding scale.   I discussed the patient's condition and plan of treatment with the patient's son, who is the health power of attorney.  CODE STATUS: He said he wants the patient full code status.   TIME SPENT: About 62 minutes.    ____________________________ Demetrios Loll, MD qc:ST D: 04/21/2014 21:13:23 ET T: 04/21/2014 21:42:00 ET JOB#: [id number]  cc: Demetrios Loll, MD, <Dictator> Demetrios Loll MD ELECTRONICALLY SIGNED 04/22/2014 18:10

## 2014-06-23 NOTE — Discharge Summary (Signed)
PATIENT NAME:  [name] [name], [name] [name] MR#:  [id number] DATE OF BIRTH:  08-Oct-1945  DATE OF ADMISSION:  04/16/2014 DATE OF DISCHARGE:  04/18/2014  ADMITTING PHYSICIAN: Demetrios Loll, MD    DISCHARGING PHYSICIAN: Gladstone Lighter, MD   PRIMARY CARE PHYSICIAN: Mikeal Hawthorne. Brynda Greathouse, [place]: Cardiology consultation by Dr. Rockey Situ.   DISCHARGE DIAGNOSES:  1.  Acute diastolic congestive heart failure.  2.  Hypertrophic obstructive cardiomyopathy.  3.  Hypertension.  4.  Diabetes mellitus.  5.  Schizophrenia.  6.  Sleep apnea.  7.  Gastroesophageal reflex disease.   DISCHARGE HOME MEDICATIONS:  1.  Ranitidine 150 mg p.o. b.i.d.  2.  Fluphenazine 10 mg p.o. daily.  3.  Glipizide 10 mg p.o. b.i.d.  4.  Oxybutynin 10 mg daily.  5.  Atorvastatin 20 mg p.o. at bedtime.  6.   Januvia 100 mg p.o. daily.  7.  Aspirin 81 mg p.o. daily.  8.  Fluphenazine decanoate 50 mg intramuscular every 3 weeks.  9.  Lisinopril 20 mg p.o. b.i.d.  10.  Catapres 0.1 mg transdermal patch every week.  11.  Lorazepam 0.5 mg 3 times a day as needed for anxiety.  12.  Lasix 20 mg 1 tablet as needed for peripheral edema or dyspnea.  13.  Imdur 30 mg daily.  14.  Coreg 25 mg p.o. b.i.d.  15.  Olanzapine 15 mg p.o. b.i.d.  16.  Benztropine 1 mg p.o. b.i.d.  17.  Trazodone 200 mg p.o. at bedtime p.r.n. for sleep.   DISCHARGE DIET: Low-sodium, carbohydrate-controlled diet.   DISCHARGE HOME OXYGEN: None.   DISCHARGE ACTIVITY: As tolerated.    FOLLOWUP INSTRUCTIONS:  1.  Home health nursing, physical therapy, nurse aide, and social worker advised.  2.  PCP followup in 1-2 weeks.  3.  Cardiology followup in 1 week.  4.  ACT team followup for psychiatric issues.   LABORATORIES AND IMAGING STUDIES PRIOR TO DISCHARGE:  1.  Sodium 138, potassium 4.4, chloride 96, bicarbonate 34, BUN 29, creatinine 1.5, glucose 296, and calcium of 9.4.  2.  Chest x-ray on exam showing clear lung fields. No acute  cardiopulmonary disease.  3.  HbA1c is elevated at 9.  4.  Urinalysis negative for any infection.  5.  Troponins remained negative.  6.  WBC showing 11.6, hemoglobin 14.1, hematocrit 44.3, platelet count of 261,000.  7.  Magnesium 1.7, which was replaced,  8.  LDL cholesterol 47, HDL 64, cholesterol 134, triglycerides of 113.  9.  TSH within normal limits at 2.05.  10.  Echo Doppler showing LV ejection fraction is normal at 60%-65%; however, moderate LVH with LV outflow tract gradient estimated at 68 mm with Valsalva maneuver. Very small left ventricular internal cavity size is noted. Impaired relaxation of LV diastolic filling is noted.   BRIEF HOSPITAL COURSE: [name] is a 69 year old African American female with history of schizoaffective disorder, diabetes, hypertension, lives at home by herself, presents to the hospital secondary to worsening shortness of breath for 4 months.  1.  Dyspnea: Likely secondary to diastolic CHF exacerbation and also from left ventricular outflow tract obstruction. The patient has seen Dr. Clayborn Bigness about a week ago and has echocardiogram and also [place] study done as an outpatient, results of which are pending; however, she wished to see a different cardiologist in the hospital, so [facility] was consulted. She had a repeat echocardiogram done, which showed LV outflow tract obstruction. She was initially diuresed with Lasix;  however, since it was preload-dependent, Lasix was held and was advised to use only as needed. Blood pressure control was advised and outpatient followup recommended to see if she could be referred to [facility] for myocardial stripping for hypertrophic obstructive cardiomyopathy. Her symptoms have improved and her chest x-ray improved after diuresis. She has not required any oxygen while in the hospital and is being discharged home with outpatient followup.  2.  Hypertension: Medications were adjusted and blood  pressure was better controlled.  3.  Diabetes mellitus, uncontrolled: She is on glipizide and Januvia. Might need a third agent. The patient says she is ALLERGIC TO METFORMIN. will advise PCP to follow up on that.  4.  Schizoaffective disorder: Follows with ACT team. Her home medications are being continued without any changes.   Her course has been otherwise uneventful while in the hospital.   DISCHARGE CONDITION: Stable.   DISCHARGE DISPOSITION: Home with home health.   TIME SPENT ON DISCHARGE: 45 minutes.    ____________________________ Gladstone Lighter, MD rk:bm D: 04/18/2014 15:35:16 ET T: 04/19/2014 01:59:03 ET JOB#: [id number]  cc: Gladstone Lighter, MD, <Dictator> Minna Merritts, MD Mikeal Hawthorne Brynda Greathouse, MD Gladstone Lighter MD ELECTRONICALLY SIGNED 05/09/2014 17:46

## 2014-06-23 NOTE — Consult Note (Signed)
General Aspect Primary Cardiologist: New to [facility] _______________  69 year old female with history of  hypertension, schizophrenia, diabetes, and obesity who presents to [facility] overnight with a 4-5 month history of increasing SOB and bilateral lower extremity edema.  ______________  PMH: 1. HTN 2. Schizophrenia 3. DM2 4. Obesity 5. OSA 6. GERD ______________   Present Illness 69 year old female with the above problem list who presented to [facility] overnight with the above CC.  She was recently evaluated by [facility] for her increasing SOB and lower extremity edema and reportedly had a stress test as well as an echo. She does not have these results, nor are they in [place] at this time. Prior to that evaluation she did undergo a nuclear stress test back in 2010, she does not remember why. Results are not on file for the nuclear imaging. EKG was without changes at that time. She reports no prior cardiac caths.   Over the past 4-5 months she has been experiencing increasing SOB and lower extremity edema.  She notes orthopnea, requiring usage of 4 pillows at night to help her breathe. She has not been sleeping well. She does note PND. She denies any chest pain. She does not add salt to her food, but she does not watch what foods she eats and knows she eats a lot of foods that contain hidden salts. She does note a cough that has been productive of clear to white sputum. No early satiety. No abdominal distention. Her SOB and lower extremity edema have been getting worse for the past 1 week prompting her to come in for evaluation. She reports her baseline weight is around 286 to 289, but she reports that she cannot be certain regarding this.   Upon her arrival to [facility] she was found to have troponin negative x 3. pBNP of 378. WBC count of 11.6.  CXR with vascular congestion, no edema or consolidation. She was started on IV Lasix 40 mg bid and nebs. Echo is pending. She currently states her  breathing is much better this morning after voiding.   Physical Exam:  GEN well developed, no acute distress, obese   HEENT hearing intact to voice   NECK supple  difficult to examine 2/2 habitus   RESP normal resp effort  wheezing   CARD Regular rate and rhythm  No murmur   ABD denies tenderness  soft   EXTR trace non-pitting edema bilaterally to the mid shin   SKIN normal to palpation   NEURO cranial nerves intact   PSYCH alert   Review of Systems:  Subjective/Chief Complaint SOB,  leg swelling better   General: Fatigue   Skin: No Complaints   ENT: No Complaints   Eyes: No Complaints   Neck: No Complaints   Respiratory: Frequent cough  Short of breath  Wheezing  Sputum   Cardiovascular: Dyspnea  Orthopnea  Edema   Gastrointestinal: No Complaints   Genitourinary: No Complaints   Vascular: No Complaints   Musculoskeletal: No Complaints   Neurologic: No Complaints   Hematologic: No Complaints   Endocrine: No Complaints   Psychiatric: No Complaints   Review of Systems: All other systems were reviewed and found to be negative   Medications/Allergies Reviewed Medications/Allergies reviewed   Family & Social History:  Family and Social History:  Family History Negative  Hypertension   Social History negative tobacco, negative ETOH, negative Illicit drugs   Place of Living Home     Schizoaffective  DO:    Sleep Apnea:    peripheral edema:    GERD - Esophageal Reflux:    Hypertension:    Diabetes:          Admit Diagnosis:   CHF: Onset Date: 16-Apr-2014, Status: Active, Description: CHF  Home Medications: Medication Instructions Status  furosemide 20 mg oral tablet 1 tab(s) orally 2 times a day Active  ranitidine 150 mg oral tablet 1 tab(s) orally 2 times a day Active  fluPHENAZine 10 mg oral tablet 1 tab(s) orally once a day (at bedtime) Active  glipiZIDE 10 mg oral tablet 1 tab(s) orally 2 times a day Active  oxybutynin  extended release 10 mg/24 hr oral tablet, extended release 1 tab(s) orally once a day Active  lisinopril 20 mg oral tablet 1 tab(s) orally 2 times a day Active  cloNIDine 0.2 mg oral tablet 1 tab(s) orally 2 times a day Active  metoprolol tartrate 50 mg oral tablet 1 tab(s) orally 2 times a day Active  atorvastatin 20 mg oral tablet 1 tab(s) orally once a day (at bedtime) Active  Januvia 100 mg oral tablet 1 tab(s) orally once a day Active  Aspirin Low Dose 81 mg oral delayed release tablet 1 tab(s) orally once a day Active  LORazepam 0.5 mg oral tablet 1 tab(s) orally 3 times a day Active   Lab Results:  Thyroid:  23-Feb-16 04:37   Thyroid Stimulating Hormone 2.05 (0.45-4.50 (IU = International Unit)  ----------------------- Pregnant patients have  different reference  ranges for TSH:  - - - - - - - - - -  Pregnant, first trimetser:  0.36 - 2.50 uIU/mL)  Routine Chem:  23-Feb-16 04:37   B-Type Natriuretic Peptide ([facility])  378 (Result(s) reported on 16 Apr 2014 at 05:34AM.)  Cholesterol, Serum 134  Triglycerides, Serum 113  HDL (INHOUSE)  64  VLDL Cholesterol Calculated 23  LDL Cholesterol Calculated 47 (Result(s) reported on 16 Apr 2014 at 05:34AM.)  Glucose, Serum  287  BUN 16  Creatinine (comp) 1.18  Sodium, Serum 139  Potassium, Serum 3.8  Chloride, Serum 100  CO2, Serum  33  Calcium (Total), Serum 9.2  Anion Gap  6  Osmolality (calc) 289  eGFR (African American)  59  eGFR (Non-African American)  48 (eGFR values <34m/min/1.73 m2 may be an indication of chronic kidney disease (CKD). Calculated eGFR, using the MRDR Study equation, is useful in  patients with stable renal function. The eGFR calculation will not be reliable in acutely ill patients when serum creatinine is changing rapidly. It is not useful in patients on dialysis. The eGFR calculation may not be applicable to patients at the low and high extremes of body sizes, pregnant women, and vegetarians.)   Magnesium, Serum  1.7 (1.8-2.4 THERAPEUTIC RANGE: 4-7 mg/dL TOXIC: > 10 mg/dL  -----------------------)  Cardiac:  22-Feb-16 21:36   Troponin I < 0.02 (0.00-0.05 0.05 ng/mL or less: NEGATIVE  Repeat testing in 3-6 hrs  if clinically indicated. >0.05 ng/mL: POTENTIAL  MYOCARDIAL INJURY. Repeat  testing in 3-6 hrs if  clinically indicated. NOTE: An increase or decrease  of 30% or more on serial  testing suggests a  clinically important change)  23-Feb-16 04:37   Troponin I < 0.02 (0.00-0.05 0.05 ng/mL or less: NEGATIVE  Repeat testing in 3-6 hrs  if clinically indicated. >0.05 ng/mL: POTENTIAL  MYOCARDIAL INJURY. Repeat  testing in 3-6 hrs if  clinically indicated. NOTE: An increase or decrease  of 30% or more on serial  testing suggests a  clinically important change)    09:33   Troponin I < 0.02 (0.00-0.05 0.05 ng/mL or less: NEGATIVE  Repeat testing in 3-6 hrs  if clinically indicated. >0.05 ng/mL: POTENTIAL  MYOCARDIAL INJURY. Repeat  testing in 3-6 hrs if  clinically indicated. NOTE: An increase or decrease  of 30% or more on serial  testing suggests a  clinically important change)  Routine Hem:  22-Feb-16 21:36   WBC (CBC)  11.6  RBC (CBC) 5.15  Hemoglobin (CBC) 14.1  Hematocrit (CBC) 44.3  Platelet Count (CBC) 261 (Result(s) reported on 15 Apr 2014 at 09:53PM.)   EKG:  EKG Interp. by me   Interpretation EKG shows NSR, 72 bpm, PACs, lateral TWI   Radiology Results: XRay:    22-Feb-16 21:52, Chest Portable Single View  Chest Portable Single View   REASON FOR EXAM:    Shortness of Breath, bibasilar rales  COMMENTS:       PROCEDURE: DXR - DXR PORTABLE CHEST SINGLE VIEW  - Apr 15 2014  9:52PM     CLINICAL DATA:  Shortness of breath. Increasing for 1 week. History  of CHF with increasing lower extremity edema.    EXAM:  PORTABLE CHEST - 1 VIEW    COMPARISON:  02/15/2014    FINDINGS:  Shallow inspiration. Mild cardiac enlargement with mild  pulmonary  vascular congestion, similar prior study. No edema or consolidation.  No blunting of costophrenic angles. No pneumothorax. Degenerative  changes in the spine and shoulders.     IMPRESSION:  Mild cardiac enlargement and vascular congestion. No edema or  consolidation.      Electronically Signed    By: Lucienne Capers M.D.    On: 04/15/2014 21:57         Verified By: Neale Burly, M.D.,    Haldol: Swelling  Metformin: GI Distress   Impression 69 year old female with history of  hypertension, schizophrenia, diabetes, and obesity who presents to [facility] overnight with a 4-5 month history of increasing SOB and bilateral lower extremity edema.  Previously seen by [facility]. patient reports that she wants to change to [facility]. Reason unclear.  1. Acute CHF: -Chronicity unknown -Try to obtain echo and stress test from [facility] -Continue with diuresis with IV Lasix 40 mg bid at this time until we get echo results later today, consider tid dosing -Continue Coreg 25 mg bid -Change lisinopril to 40 mg daily -Likely plan for nuclear stress test to evaluate for high risk ischemia -Low salt diet  2. Accelerated HTN: -Add Imdur 30 mg daily -Change lisinopril to 40 mg daily -Clonidine 0.1 mg weekly -IV hydralazine 10 mg prn SBP >180  3. Hypomagnesemia: -Replete to 2.0  4. DM2: -A1C pending -Per IM  5. Leukocytosis: -No signs of infection -CXR without consolidation -She does mention history of stress incontinence, check UA  6. OSA   Electronic Signatures: Rise Mu (PA-C)  (Signed 23-Feb-16 11:42)  Authored: General Aspect/Present Illness, History and Physical Exam, Review of System, Family & Social History, Past Medical History, Home Medications, Labs, EKG , Radiology, Allergies, Impression/Plan Ida Rogue (MD)  (Signed 23-Feb-16 14:13)  Authored: General Aspect/Present Illness, History and Physical Exam, Review of System, Family & Social History,  Health Issues, EKG , Impression/Plan  Co-Signer: General Aspect/Present Illness, History and Physical Exam, Review of System, Family & Social History, Past Medical History, Home Medications, Labs, EKG , Radiology, Allergies, Impression/Plan   Last Updated: 23-Feb-16 14:13 by Ida Rogue (MD)

## 2014-06-23 NOTE — Consult Note (Signed)
PATIENT NAME:  [name] [name], [name] [name] MR#:  [id number] DATE OF BIRTH:  01/23/1946  DATE OF CONSULTATION:  04/25/2014  REFERRING PHYSICIAN:   CONSULTING PHYSICIAN:  Gonzella Lex, MD  IDENTIFYING INFORMATION AND REASON FOR CONSULT: This is a 69 year old woman with a history of schizophrenia who is currently in the hospital for shortness of breath. Consult for adjustment of medication.   HISTORY OF PRESENT ILLNESS: Information from the chart primarily and from some observation of the patient. I also attempted to reach her son by telephone, but I received a voicemail box that was full so I could not leave any more information. The patient herself is really not able to give any history right now. She came into the hospital on February 28 with worsening shortness of breath and confusion. Since being in the hospital she has been put on BiPAP. She is being treated for CHF and other medical problems. The nursing staff tells me that yesterday she was rather agitated, moving around a lot, seemed to be shouting. When I came to see her tonight, although she was awake she was not animated at all. She made some attempts to answer a couple of questions, but I really was not able to understand her through the mask and she only answered with a single or so at a time. Since being back in the hospital this time, she has not been receiving her Zyprexa. It looks like she was only getting the Prolixin for an antipsychotic. There may have been a mix-up with the order reconciliation. She had been in the hospital just prior to this, being discharged on February 25 or 26. At that time, she was on her Zyprexa. Apparently she is currently living at, perhaps, her son's home, as the last discharge nursing note said she was going home with  DICTATION ENDS HERE << MISSING TEXT>>    ____________________________ Gonzella Lex, MD jtc:ST D: 04/25/2014 22:05:08 ET T: 04/25/2014 22:59:51 ET JOB#: [id number]  cc: Gonzella Lex, MD,  <Dictator> Gonzella Lex MD ELECTRONICALLY SIGNED 04/30/2014 10:40

## 2014-06-23 NOTE — Consult Note (Signed)
General Aspect Primary Cardiologist: Dr. Rockey Situ, MD _______________  69 year old female with history of  hypertensive heart disease, schizophrenia, diabetes, and obesity who presented to [facility] yesterday with recurrent acute on chronic hypercarbic respiratory failure.  PMH: 1. Hypertensive heart disease/severe LVH/hypertrophic obstructive cardiomyopathy      a. 03/2014 Echo: EF 60-65%, mod LVH, LVOT grad of 50mHg, Diast dysfxn, Mild TR, nl RVSP. 2. Schizophrenia 3. DM2 4. Obesity 5. OSA 6. GERD ______________   Present Illness [age]year old female with the above problem list.   [facility]  On the two prior occasions, she was admitted with progressive dyspnea and AMS @ home in the setting of acute on chronic hypercarbic respiratory failure and hypersomnolence and noncompliance with cpap @ home (she says that she no longer has a cpap machine).  She also has HOCM with diastolic dysfunction and a LVOT gradient of 68 mmHg.  In that setting, she has a somewhat narrow euvolemic window as she is likely to become more symptomatic with dehydration and/or tachycaria.  She was most recently d/c'd on 3/7 and says that she has been dyspneic @ home.  She reports compliance with her meds but cannot name them.  She apparently became more dyspneic yesterday and was again noted by her son to show an alteration in mental status.  As a result, he took her back to the ED. Here, she was found to be acidotic with a pH of 7.32 and PCO2 of 70.  She was admitted and has been refusing bipap (would rather be intubated). CXR non-acute.  Labs notable for very mild trop elevation of 0.05.  ECG w/o acute st/t changes.  Weight stable since most recent discharge.    She currently denies chest pain, dyspnea, pnd, orthopnea, n, v, dizziness, syncope, or edema.   Physical Exam:  GEN well developed, well nourished, no acute distress, obese   HEENT pink conjunctivae, hearing intact to  voice, moist oral mucosa   NECK No masses   RESP normal resp effort  no use of accessory muscles   CARD Regular rate and rhythm   ABD denies tenderness  soft  normal BS   LYMPH negative neck   EXTR negative cyanosis/clubbing, negative edema   SKIN normal to palpation, No rashes   NEURO motor/sensory function intact   PSYCH alert, poor insight, stuporous   Review of Systems:  Subjective/Chief Complaint SOB   General: No Complaints   Skin: No Complaints   ENT: No Complaints   Eyes: No Complaints   Neck: No Complaints   Respiratory: Short of breath   Cardiovascular: Dyspnea   Gastrointestinal: No Complaints   Genitourinary: No Complaints   Vascular: No Complaints   Musculoskeletal: No Complaints   Neurologic: No Complaints   Hematologic: No Complaints   Endocrine: No Complaints   Psychiatric: No Complaints   Review of Systems: All other systems were reviewed and found to be negative   Medications/Allergies Reviewed Medications/Allergies reviewed   Family & Social History:  Family and Social History:  Family History Negative  Hypertension   Social History negative tobacco, negative ETOH, negative Illicit drugs   Place of Living Home  lives locally with son.          Admit Diagnosis:   ACUTE ON CHRONIC RESPIRATORY FAILURE: Onset Date: 06-May-2014, Status: Active, Description: ACUTE ON CHRONIC RESPIRATORY FAILURE  Home Medications: Medication Instructions Status  lisinopril 5 mg oral tablet 1 tab(s) orally  once a day Active  predniSONE 10 mg oral tablet 1 tab(s) orally once a day x 3 days Active  insulin glargine 100 units/mL subcutaneous solution 15 unit(s) subcutaneous once a day (at bedtime) Active  verapamil 40 mg oral tablet 1 tab(s) orally every 6 hours Active  LORazepam 0.5 mg oral tablet 1 tab(s) orally 3 times a day, As Needed - for Anxiety, Nervousness Active  traZODone 100 mg oral tablet 1 tab(s) orally once a day (at bedtime), As  Needed - for Inability to Sleep Active  isosorbide mononitrate 30 mg oral tablet, extended release 1 tab(s) orally once a day Active  OLANZapine 15 mg oral tablet, disintegrating 1 tab(s) orally 2 times a day Active  benztropine 1 mg oral tablet 1 tab(s) orally 2 times a day Active  ranitidine 150 mg oral tablet 1 tab(s) orally 2 times a day Active  fluPHENAZine 10 mg oral tablet 1 tab(s) orally once a day (at bedtime) Active  glipiZIDE 10 mg oral tablet 1 tab(s) orally 2 times a day Active  oxybutynin extended release 10 mg/24 hr oral tablet, extended release 1 tab(s) orally once a day Active  atorvastatin 20 mg oral tablet 1 tab(s) orally once a day (at bedtime) Active  Januvia 100 mg oral tablet 1 tab(s) orally once a day Active  Aspirin Low Dose 81 mg oral delayed release tablet 1 tab(s) orally once a day Active   Lab Results:  Lab:  14-Mar-16 00:42   pH (ABG)  7.320 (7.350-7.450 NOTE: New Reference Range 09/15/13)  PCO2  70 (32-48 NOTE: New Reference Range 10/02/13)  PO2  167 (83-108 NOTE: New Reference Range 09/15/13)  Base Excess  7 (-3-3 NOTE: New Reference Range 10/02/13)  HCO3  36.1 (21.0-28.0 NOTE: New Reference Range 09/15/13)  O2 Saturation  100.4  O2 Device [place]  Specimen Site (ABG) RT RADIAL  Specimen Type (ABG) ARTERIAL  Patient Temp (ABG) 37.0  %FiO2 28.0 (Result(s) reported on 06 May 2014 at 12:57AM.)  Cardiology:  13-Mar-16 21:16   Ventricular Rate 84  Atrial Rate 84  P-R Interval 168  QRS Duration 70  QT 362  QTc 427  P Axis 47  R Axis 14  T Axis 142  ECG interpretation Normal sinus rhythm Left ventricular hypertrophy with repolarization abnormality ST elevation, consider early repolarization, pericarditis, or injury Abnormal ECG When compared with ECG of 21-Apr-2014 17:19, ST less elevated in Lateral leads ----------unconfirmed---------- Confirmed by PARASCHOS, ALEX ([id number]), editor PEARSON, BARBARA ([id number]) on 05/06/2014 10:20:49 AM  Overreader:  Miquel Dunn  Routine Chem:  14-Mar-16 03:24   Glucose, Serum  196 (65-99 NOTE: New Reference Range  04/16/14)  BUN  21 (6-20 NOTE: New Reference Range  04/16/14)  Creatinine (comp) 0.98 (0.44-1.00 NOTE: New Reference Range  04/16/14)  Sodium, Serum 138 (135-145 NOTE: New Reference Range  04/16/14)  Potassium, Serum 4.2 (3.5-5.1 NOTE: New Reference Range  04/16/14)  Chloride, Serum  98 (101-111 NOTE: New Reference Range  04/16/14)  CO2, Serum 31 (22-32 NOTE: New Reference Range  04/16/14)  Calcium (Total), Serum 8.9 (8.9-10.3 NOTE: New Reference Range  04/16/14)  Anion Gap 9  eGFR (African American) >60  eGFR (Non-African American)  59 (eGFR values <35m/min/1.73 m2 may be an indication of chronic kidney disease (CKD). Calculated eGFR is useful in patients with stable renal function. The eGFR calculation will not be reliable in acutely ill patients when serum creatinine is changing rapidly. It is not useful in patients on dialysis. The eGFR calculation may  not be applicable to patients at the low and high extremes of body sizes, pregnant women, and vegetarians.)  Cardiac:  14-Mar-16 03:24   Troponin I 0.03 (0.00-0.03 0.03 ng/mL or less: NEGATIVE  Repeat testing in 3-6 hrs  if clinically indicated. >0.03 ng/mL: POTENTIAL  MYOCARDIAL INJURY. Repeat  testing in 3-6 hrs if  clinically indicated. NOTE: An increase or decrease  of 30% or more on serial  testing suggests a  clinically important change NOTE: New Reference Range  04/16/14)  Routine Hem:  14-Mar-16 03:24   WBC (CBC)  11.1  RBC (CBC) 4.53  Hemoglobin (CBC) 12.3  Hematocrit (CBC) 39.7  Platelet Count (CBC) 232  MCV 88  MCH 27.2  MCHC  31.0  RDW 14.1  Neutrophil % 57.4  Lymphocyte % 27.8  Monocyte % 12.5  Eosinophil % 1.0  Basophil % 1.3  Neutrophil # 6.4  Lymphocyte # 3.1  Monocyte #  1.4  Eosinophil # 0.1  Basophil # 0.1 (Result(s) reported on 06 May 2014 at 03:57AM.)   EKG:  EKG  Interp. by me   Interpretation EKG shows NSR, 84, lvh w/ repol abnormalities. no acute st/t changes.   Radiology Results: XRay:    13-Mar-16 22:18, Chest PA and Lateral  Chest PA and Lateral   REASON FOR EXAM:    SOB  COMMENTS:       PROCEDURE: DXR - DXR CHEST PA (OR AP) AND LATERAL  - May 05 2014 10:18PM     CLINICAL DATA:  Weakness, dyspnea, diaphoresis    EXAM:  CHEST  2 VIEW    COMPARISON:  04/23/2014    FINDINGS:  Heart size is unchanged. The lungs are clear. There are no  effusions.   IMPRESSION:  No active cardiopulmonary disease.      Electronically Signed    By: Andreas Newport M.D.    On: 05/05/2014 22:42         Verified By: Andreas Newport, M.D.,    Haldol: Swelling  Metformin: GI Distress  Vital Signs/Nurse's Notes: **Vital Signs.:   14-Mar-16 07:00  Vital Signs Type Routine  Temperature Temperature (F) 97.9  Celsius 36.6  Temperature Source oral  Pulse Pulse 77  Pulse source if not from Vital Sign Device per cardiac monitor  Respirations Respirations 24  Systolic BP Systolic BP 185  Diastolic BP (mmHg) Diastolic BP (mmHg) 86  Mean BP 109  BP Source  if not from Vital Sign Device non-invasive  Pulse Ox % Pulse Ox % 95  Pulse Ox Activity Level  At rest  Oxygen Delivery 2L; Nasal Cannula  Pulse Ox Heart Rate 14    Impression 69 year old female with history of  hypertension, schizophrenia, diabetes, chronic hypercarbic resp failure, HOCM, and obesity who presented to [facility] on 3/13 secondary to progressive dyspnea and AMS.   1.  Acute on chronic hypercarbic respiratory failure patient's third admission in the past month.  ABG on admission showed respiratory acidosis with a CO2 of 70.  As previously discussed, she is a CO2 retainer with resultant hypercarbia, altered mental status, and hypersomnolence. No evidence of volume overload on exam and weight is in line with prior d/c weight. She has refused bipap previously but after our  discussion, she says that she would be willing to try again and is interested in getting a new machine @ home. --She is someone who will require bipap/cpap @ bedtime and also for naps. --F/U ABG. --Rec pulmonary eval for input., unclear if  she needs CPAP or bipap --most likely will need nursing home placement for meds and compliance.  2.  HOCM/Chronic diastolic CHF Weight is stable since most recent d/c on the seventh. Volume difficult to judge 2/2 obesity but no obvious volume overload. --Focus on HR/BP mgmt along with avoidance of dehydration. --Continue verapamil. --D/C long acting nitrate.  3. HTN --Cont verapamil and titrate upwards as BP tolerates  4. DM2 Poorly controlled. --Per medicine  5. Troponin Elevation Mild elevation.  Likely represents demand ischemia in the setting of #1. No ischemia workup at this time --Follow.   Electronic Signatures: Rogelia Mire (NP)  (Signed 14-Mar-16 14:16)  Authored: General Aspect/Present Illness, History and Physical Exam, Review of System, Family & Social History, Home Medications, Labs, EKG , Radiology, Allergies, Vital Signs/Nurse's Notes, Impression/Plan Ida Rogue (MD)  (Signed 14-Mar-16 19:12)  Authored: General Aspect/Present Illness, History and Physical Exam, Review of System, Family & Social History, Health Issues, Labs, EKG , Vital Signs/Nurse's Notes, Impression/Plan  Co-Signer: General Aspect/Present Illness, History and Physical Exam, Review of System, Family & Social History, Home Medications, Labs, EKG , Radiology, Allergies, Vital Signs/Nurse's Notes, Impression/Plan   Last Updated: 14-Mar-16 19:12 by Ida Rogue (MD)

## 2014-06-23 NOTE — Consult Note (Signed)
PATIENT NAME:  [name] [name], [name] [name] MR#:  [id number] DATE OF BIRTH:  [birth year]  DATE OF CONSULTATION:  06/19/2014  REFERRING PHYSICIAN:   CONSULTING PHYSICIAN:  Allyne Gee, MD  PULMONARY CONSULTATION   REASON FOR CONSULTATION: Respiratory failure with hypercapnia.   HISTORY OF PRESENT ILLNESS: This is a 69 year old African American female with past medical history significant for COPD, presented to the hospital with increasing shortness of breath. She says that she had been actually not feeling good for a few weeks. She has cough and congestion, has been bringing up some phlegm. No hemoptysis noted. No chest pain. The patient has some swelling on the legs. She had no headaches or dizziness. No syncopal episodes. The patient had initially been noted to be confused and is now doing much better. In the Emergency Room, when she was evaluated, she was noted to have hypercapnia, so she was admitted and started initially on BiPAP.   REVIEW OF SYSTEMS: A complete review of system is performed and is unremarkable, other than what is noted above in the HPI.   PAST MEDICAL HISTORY: Significant for: Sleep apnea, COPD, morbid obesity, type 2 diabetes, GERD.   SURGICAL HISTORY: Significant for: Knee replacement, cholecystectomy, and tonsillectomy and adenoidectomy.   SOCIAL HISTORY: The patient lives alone. No smoking, drinking, or drugs are noted.   FAMILY HISTORY: Significant for hypertension.   ALLERGIES: HALDOL AND METFORMIN.   MEDICATIONS: Reviewed on the electronic medical record.   PHYSICAL EXAMINATION: GENERAL: At the time that she is seen, she is awake, appears to be comfortable.  VITAL SIGNS: Temperature was 98.1, pulse 86, respiratory rate 20, blood pressure 146/85, saturations are 97%. She was on 2 liters nasal cannula off BiPAP.  NECK: Supple. No JVD. No adenopathy. No thyromegaly.  CHEST: Coarse breath sounds, some rhonchi, no rales. Expansion was equal.  CARDIOVASCULAR: S1, S2 is  normal. Regular rhythm. No gallop or rub.  ABDOMEN: Soft, nontender, morbidly obese.  EXTREMITIES: Without cyanosis or clubbing, some edema, pulses equal.  MUSCULOSKELETAL: No active synovitis.  NEUROLOGIC: She was moving all 4 extremities. Gait was not checked.  PSYCHIATRIC: Remarkable to the present illness.   LABORATORIES: White count 11.1, hemoglobin 13.2, hematocrit 41.5. The patient's platelet count was 279,000. Chemistries showed a sodium 142, potassium 3.6. CO2 was 33. The patient's admission x-ray showed no acute infiltrates.   IMPRESSION: 1. Acute on chronic respiratory failure with hypercapnia.  2. Chronic obstructive pulmonary disease with exacerbation.   PLAN: She will be admitted to the hospital and started initially on noninvasive ventilation; is doing better now, so it is okay to probably take her off. She was also started on antibiotics in the form of Levaquin. She will continue with that. Continue with inhalers as you are. She is on prednisone and would continue with a taper schedule as you are. She should follow up with me in the office in a week or 2 after discharge, where we can complete her work-up. Thank you for consulting me in the care of this patient. We will make further recommendations as necessary.     ____________________________ Allyne Gee, MD sak:mw D: 06/19/2014 12:15:18 ET T: 06/19/2014 13:01:21 ET JOB#: [id number]  cc: Allyne Gee, MD, <Dictator> Allyne Gee MD ELECTRONICALLY SIGNED 06/19/2014 22:32

## 2014-06-23 NOTE — Consult Note (Signed)
Psychiatry: Follow-up for this patient with schizophrenia.  Today she was sitting up neatly dressed and waiting for discharge.  She tells me she is feeling much better.  Patient did not make any psychotic statements.  Her affect does seem very expansive but not in a destructive manner.  She was alert and oriented and appropriate in her interactions. change to medication at this point.  Patient needs to continue outpatient psychiatric follow-up.  I reviewed with her the crucial importance of staying compliant with her medicine [facility].   Electronic Signatures: Rhea Thrun, Madie Reno (MD)  (Signed on 07-Mar-16 16:55)  Authored  Last Updated: 07-Mar-16 16:55 by Gonzella Lex (MD)

## 2014-06-23 NOTE — Consult Note (Signed)
PATIENT NAME:  [name] [name], [name] [name] MR#:  [id number] DATE OF BIRTH:  1946/02/03  DATE OF CONSULTATION:  04/25/2014  REFERRING PHYSICIAN:   CONSULTING PHYSICIAN:  Gonzella Lex, MD  IDENTIFYING INFORMATION AND REASON FOR CONSULTATION: A 69 year old woman with a long history of schizophrenia currently in the hospital for congestive heart failure and shortness of breath.   HISTORY OF PRESENT ILLNESS: Information primarily from the chart, also to some extent from the patient and from the nursing staff. The patient was admitted hospital on the 28th, with worsening shortness of breath. She had just been discharged 2 days previously; since being in the hospital, nursing reports that she has been intermittently agitated with some outbursts of agitation, waving her arms, shouting. When I came to see her this evening she was quiet although awake. She is on a BiPAP right now and is really not able to vocalize well or give much of an answer to questions. I see that she had been getting (Dictation Anomaly)<<herMISSING TEXT>>  Prolixin, but it does appear that she has been getting her Zyprexa, since coming in on the 28th.   PAST PSYCHIATRIC HISTORY: Long history of psychosis, schizophrenia versus schizoaffective disorder, multiple hospitalizations over the years. In the past, the patient has often been very difficult to manage with a lot of agitation verbal and physical. She has been on multiple medications. The last time she was on our psychiatry ward in 2014, she was treated with Zyprexa 15 mg b.i.d., as well as the Prolixin and was transferred to [facility]. From what I can see in the records, it looks like she was probably on the same thing when she got out.   SOCIAL HISTORY: From what I can tell it looks like she is living with her son probably. It is possible that she could be back living in her own home, although with her current physical condition it is hard to imagine how that is working. Her son  has the power of attorney for her. The social work note indicates that there is some work being done on possibly getting her moved into a living facility. She does have home healthcare set up.   FAMILY HISTORY: Unidentified.   PAST MEDICAL HISTORY: The patient is morbidly obese and also has congestive heart failure, as well as diabetes, high blood pressure, dyslipidemia.   SUBSTANCE ABUSE HISTORY: No known significant substance abuse problems.   REVIEW OF SYSTEMS: The patient is not able to offer any specific review of systems at this time.   MENTAL STATUS EXAMINATION: Acutely ill woman, interviewed in her hospital room. She was awake. Really was not able to give coherent answers. Not able to speak much. I really did not get much of any information. She did not appear to be agitated when I spoke with her.   CURRENT MEDICATIONS: Solu-Medrol 40 mg IV daily, insulin Lantus 10 units at night, Zantac 150 mg twice a day, oxybutynin 10 mg a day, glipizide 10 mg twice a day, Catapres 0.1 mg twice a day, Januvia 100 mg per day, Ativan 0.5 mg at night, verapamil 40 mg every 6 hours; Prolixin 10 mg once a day, nitroglycerin p.r.n., morphine p.r.n., lisinopril 20 mg twice a day, atorvastatin 20 mg at night, aspirin 81 mg a day, Norco 1 to 2 q. 4 hours for moderate pain.   ALLERGIES: HALDOL AND METFORMIN.   LABORATORY RESULTS: Multiple laboratories have been done over several days, currently glucose is continuing to run high, creatinine is  2.2 right now, ammonia was normal.   VITAL SIGNS: Blood pressure 109/69, pulse 85, temperature 98.1.   ASSESSMENT: A 69 year old woman with long-standing schizophrenia that has been difficult to control, even with the most aggressive medication, she is often very disorganized, agitated and psychotic. She also has severe medical problems which right now are flaring up pretty badly; probably just through an oversight her Zyprexa was not continued on this admission; this would  probably be leading to more agitation.   TREATMENT PLAN: I am going to restart the Zyprexa Zydis, although I am going to go with 10 mg twice a day; Zyprexa usually does not suppress breathing much, but I want to be careful not to over sedate her. Continue the Prolixin as prescribed. No change to other medication. We will follow up as needed.   DIAGNOSIS, PRINCIPAL AND PRIMARY:   AXIS I:  Schizophrenia.   SECONDARY DIAGNOSES:  AXIS I: No further.   AXIS II: Deferred.   AXIS III: Congestive heart failure, diabetes, overweight, hypertension.    ____________________________ Gonzella Lex, MD jtc:nt D: 04/25/2014 22:12:25 ET T: 04/25/2014 22:24:57 ET JOB#: [id number]  cc: Gonzella Lex, MD, <Dictator> Gonzella Lex MD ELECTRONICALLY SIGNED 04/30/2014 10:40

## 2014-06-23 NOTE — H&P (Signed)
PATIENT NAME:  [name] [name], [name] [name] MR#:  [id number] DATE OF BIRTH:  03-18-1945  DATE OF ADMISSION:  05/06/2014  REFERRING PHYSICIAN: Debbrah Alar, MD  PRIMARY CARE PHYSICIAN: Mikeal Hawthorne. Brynda Greathouse, MD   CHIEF COMPLAINT: Shortness of breath.   HISTORY OF PRESENT ILLNESS: A 69 year old African American female with history of obstructive sleep apnea, noncompliant with CPAP or BiPAP therapy, diastolic congestive heart failure, EF 60%, presenting with shortness of breath. She is a fairly poor historian and she described having shortness of breath of 1-day total duration. Denies any fevers, chills, chest pain, or cough. Does describe having lower extremity edema; however, this is somewhat improved from baseline. Of note, recently discharged from [facility] 04/29/2014 with discharge diagnosis of acute hypercapnic respiratory failure and required BiPAP throughout that admission, successfully weaned off, and discharged without complication.   REVIEW OF SYSTEMS:  CONSTITUTIONAL: Denies fevers, chills. Positive for fatigue, weakness.  EYES: Denied blurred vision, double vision, or eye pain.  EARS, NOSE, THROAT: Denies tinnitus, ear pain, or hearing loss.  RESPIRATORY: Denies cough or wheezing. Positive for shortness of breath.  CARDIOVASCULAR: Denies chest pain or palpitations. Positive for edema.  GASTROINTESTINAL: Denies nausea, vomiting, diarrhea, or abdominal pain. GENITOURINARY: Denies dysuria or hematuria.  ENDOCRINE: Denies nocturia or thyroid problems.  HEMATOLOGIC AND LYMPHATIC: Denies easy bruising or bleeding.  SKIN: Denies rash or lesions.  MUSCULOSKELETAL: Denies any neck, back, shoulder, knees, hips, or arthritic symptoms.  NEUROLOGIC: Denies paralysis or paresthesias.  PSYCHIATRIC: Denies anxiety or depressive symptoms.   Otherwise, full review of systems performed by me is negative.   PAST MEDICAL HISTORY: Includes diastolic congestive heart failure, EF 60%; obstructive sleep  apnea, noncompliant with CPAP or BiPAP therapy; gastroesophageal reflux disease without esophagitis; hypertension, essential; type 2 diabetes, insulin requiring.   SOCIAL HISTORY: No alcohol, tobacco, or drug use.   FAMILY HISTORY: Positive for hypertension.   ALLERGIES: HALDOL AS WELL AS METFORMIN. HALDOL CAUSING SWELLING. METFORMIN, GI DISTRESS.   HOME MEDICATIONS: Include aspirin 81 mg p.o. daily, lisinopril 5 mg p.o. daily, Imdur 30 mg p.o. daily, verapamil 40 mg p.o. q. 6 hours, lorazepam 0.5 mg p.o. 3 times daily as needed for anxiety or nervousness, trazodone 100 mg p.o. at bedtime as needed for sleep, glipizide 10 mg p.o. b.i.d., Lantus 15 units subcutaneous at bedtime, Januvia 100 mg p.o. daily, atorvastatin 20 mg p.o. at bedtime, benztropine 1 mg p.o. b.i.d., fluphenazine 10 mg p.o. at bedtime, olanzapine 50 mg p.o. b.i.d., ranitidine 150 mg p.o. b.i.d., oxybutynin 10 mg daily.   PHYSICAL EXAMINATION:  VITAL SIGNS: Temperature 98, heart rate 79, respirations on arrival 26, blood pressure 167/94, saturating 97% on supplemental O2. Weight 127.9 kg, BMI 45.5.  GENERAL: Obese, African American female in minimal distress given respiratory status.  HEAD: Normocephalic, atraumatic.  EYES: Pupils equal, round, reactive to light. Extraocular muscles intact. No scleral icterus.  MOUTH: Moist mucous membranes. Dentition intact. No abscess noted. EARS, NOSE, THROAT: Clear without exudates. No external lesions. NECK: Supple. No thyromegaly. No nodules. No JVD. PULMONARY: Grossly diminished breath sounds throughout all lung fields secondary to poor respiratory effort. No frank wheezes, rales, or rhonchi. No use of accessory muscles. Poor respiratory effort as stated above.  CHEST: Nontender to palpation.  CARDIOVASCULAR: S1, S2, regular rate and rhythm. No murmurs, rubs, or gallops. She has 1+ edema to the shins bilaterally. Pedal pulse 2+ bilaterally.  GASTROINTESTINAL: Soft, nontender,  nondistended. No masses. Positive bowel sounds. No hepatosplenomegaly.  MUSCULOSKELETAL: No swelling, clubbing. Positive  for edema as described above. Range of motion full in all extremities.  NEUROLOGIC: Cranial nerves II through XII intact. No gross focal neurological deficits. Sensation intact. Reflexes intact.  SKIN: No ulceration, lesions, rashes, or cyanosis. Skin warm, dry. Turgor intact.  PSYCHIATRIC: Mood and affect blunted. She is awake, alert, oriented x 3. Her speech pattern is somewhat slow. Her insight and judgment appear to be poor at this time.   LABORATORY DATA: Sodium of 139, potassium 4.3, chloride 98, bicarbonate of 33, BUN 22, creatinine 1.38, glucose 213. Troponin less than 0.02. WBC of 12.7, hemoglobin of 13, platelets of 240,000. ABG performed, pH 7.32, CO2 of 70, oxygen of 167, bicarbonate of 36.1. This is via nasal cannula.   Chest x-ray performed which reveals no acute cardiopulmonary process.   ASSESSMENT AND PLAN: A 69 year old African American female with history of obstructive sleep apnea, noncompliant with CPAP or BiPAP therapy, diastolic congestive heart failure, ejection fraction 60%, presenting with shortness of breath.  1.  Acute on chronic respiratory failure with hypercapnia, respiratory rate of 26 on arrival. ABG revealing acidosis, 7.32 with CO2 of 70. It appears the patient has chronic retention given elevated bicarbonate, but she is now acidotic. We will place on BiPAP. Follow ABG and CO2 levels. Provide steroid 60 mg intravenously daily. DuoNeb treatments q. 4 hours. Wean BiPAP as tolerated with goal oxygen saturation above 88%.  2.  Acute kidney injury. Provide gentle intravenous fluid hydration. Follow urine output as well as renal function, and hold lisinopril for now.  3.  Type 2 diabetes, insulin requiring. We will continue with Lantus and add insulin sliding scale, q. 6 hours Accu-Cheks, hold oral agents.  4.  Hypertension, essential. Continue verapamil.  Hold lisinopril.  5.  Hyperlipidemia, unspecified. Statin therapy.  6.  Gastroesophageal reflux disease without esophagitis. H2 blockers.  7.  Venous thromboembolism prophylaxis with heparin subcutaneously.   Of note, upon placing the BiPAP on the patient, she adamantly refuses. I had an extensive conversation with her that if she refuses BiPAP and condition worsens she will need intubation,  sedation, and possibly a tracheostomy if she is unable to wean from the ventilator. She understands this and still refuses BiPAP; however, wants to remain full code.   CODE STATUS: The patient is full code.   TIME SPENT: 45 minutes.     ____________________________ Aaron Mose. Raziya Aveni, MD dkh:ts D: 05/06/2014 02:05:23 ET T: 05/06/2014 02:29:18 ET JOB#: [id number]  cc: Aaron Mose. Elysse Polidore, MD, <Dictator> Augusta Hilbert Woodfin Ganja MD ELECTRONICALLY SIGNED 05/06/2014 14:30

## 2014-06-23 NOTE — Consult Note (Addendum)
PATIENT NAME:  [name] [name], [name] [name] MR#:  [id number] DATE OF BIRTH:  08/29/1945  DATE OF CONSULTATION:  06/21/2014  SUBJECTIVE: The patient was seen in consultation at [facility] room # 235.  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  The patient has a long history of mental illness and has diagnosed schizoaffective disorder.  The patient reports that she is followed by ACT team, but could not tell what team it is. According to information obtained from the staff, the patient's son got concerned that she was not evaluated by psychiatry for a psychiatric problem.  She has been on following medications: Prolixin 10 mg every day and Zyprexa 15 mg twice a day.  OBJECTIVE: The patient was seen laying in bed with oxygen going on.  The patient is a poor historian and did not verbalize much.  CHIEF COMPLAINT: "I got sick and here I am."  HISTORY OF PRESENT ILLNESS:  The patient reports that she became sick, for which she was admitted to the hospital. A psychiatric consultation was asked.    PAST PSYCHIATRIC HISTORY: When the patient was asked about her inpatient hospitalizations, psychiatry and past history, she yelled at the undersigned and said, "I do not have to answer any of those questions.  People do not like me because I speak my mind."  The patient reported that she is being followed by ACT team and that the staff comes in and takes care of her at home and that she does take medications as prescribed.    ALCOHOL AND DRUGS: Denies.  MENTAL STATUS: The patient was seen laying in her chair with oxygen going on.  Alert and oriented.  Was upset and irritable when questions are asked.  Denies feeling depressed.  Denies feeling hopeless or helpless.  Denies hearing voices, seeing things, and said that nobody likes her around because she speaks her mind and she has been doing it since childhood.  Denies any ideas or plans to hurt  herself or others.  Contracts for safety and wants to go home and keep a followup appointment.  Insight and judgment fair.  Impulse control fair.  IMPRESSION: Schizoaffective disorder, bipolar with psychosis, currently appears to be stable.  PLAN: The patient can be discharged home and be followed by her ACT team and she relates well with them.  ____________________________ Wallace Cullens. Franchot Mimes, MD skc:AT D: 06/21/2014 14:16:01 ET T: 06/21/2014 15:47:12 ET JOB#: [id number]  cc: Arlyn Leak K. Franchot Mimes, MD, <Dictator> Dewain Penning MD ELECTRONICALLY SIGNED 06/22/2014 18:54

## 2014-06-23 NOTE — Consult Note (Signed)
General Aspect Primary Cardiologist: Dr. Rockey Situ, MD _______________  69 year old female with history of  hypertensive heart disease, schizophrenia, diabetes, and obesity who presents to [facility] after recent admission from 2/23-2/25 for SOB, severe LVH, hypertrophic obstructive cardiomyopathy who now presents with 1 day history of increased confusion and SOB.  ____________  PMH: 1. Hypertensive heart disease/severe LVH/hypertrophic obstructive cardiomyopathy 2. Schizophrenia 3. DM2 4. Obesity 5. OSA 6. GERD ______________   Present Illness 69 year old female with the above problem list who presented to [facility] overnight with the above CC. She was recently evaluated by [facility] for her increasing SOB and lower extremity edema and reportedly had a stress test as well as an echo. She does not have these results, nor are they in [facility] at this time. Prior to that evaluation she did undergo a nuclear stress test back in 2010, she does not remember why. Results are not on file for the nuclear imaging. EKG was without changes at that time. She reports no prior cardiac caths.   Over the past 4-5 months she has been experiencing increasing SOB and lower extremity edema.  She notes orthopnea, requiring usage of 4 pillows at night to help her breathe. She has not been sleeping well. She does note PND. She denies any chest pain. She does not add salt to her food, but she does not watch what foods she eats and knows she eats a lot of foods that contain hidden salts. She does note a cough that has been productive of clear to white sputum. No early satiety. No abdominal distention. Her SOB and lower extremity edema have been getting worse for the past 1 week prompting her to come in for evaluation. She reports her baseline weight is around 286 to 289, but she reports that she cannot be certain regarding this.   Physical Exam:  GEN well developed, obese   HEENT hearing intact to voice, sleepy    NECK No masses   RESP normal resp effort  no use of accessory muscles   CARD Regular rate and rhythm   ABD denies tenderness  soft   LYMPH negative neck   EXTR negative edema, tender legs   SKIN normal to palpation   NEURO motor/sensory function intact   PSYCH stuporous   Review of Systems:  Subjective/Chief Complaint very lethargic   Review of Systems: All other systems were reviewed and found to be negative   ROS Pt not able to provide ROS   Medications/Allergies Reviewed Medications/Allergies reviewed   Family & Social History:  Family and Social History:  Family History Negative  Hypertension   Social History negative tobacco, negative ETOH, negative Illicit drugs   Place of Living Home     Congestive Heart Failure:    Schizoaffective DO:    Sleep Apnea:    peripheral edema:    GERD - Esophageal Reflux:    Hypertension:    Diabetes:   Home Medications: Medication Instructions Status  furosemide 20 mg oral tablet 1 tab(s) orally once a day, As Needed for worsening dyspnea or swelling of legs Active  LORazepam 0.5 mg oral tablet 1 tab(s) orally 3 times a day, As Needed - for Anxiety, Nervousness Active  lisinopril 20 mg oral tablet 1 tab(s) orally 2 times a day Active  Catapres-TTS-1 0.1 mg/24 hr transdermal film, extended release 1 patch transdermal every 7 days (weekly) Active  isosorbide mononitrate 30 mg oral tablet, extended release 1 tab(s) orally once a  day Active  carvedilol 25 mg oral tablet 1 tab(s) orally 2 times a day Active  OLANZapine 15 mg oral tablet, disintegrating 1 tab(s) orally 2 times a day Active  benztropine 1 mg oral tablet 1 tab(s) orally 2 times a day Active  traZODone 100 mg oral tablet 2 tab(s) orally once a day (at bedtime), As Needed, sleep , As needed, sleep Active  ranitidine 150 mg oral tablet 1 tab(s) orally 2 times a day Active  fluPHENAZine 10 mg oral tablet 1 tab(s) orally once a day (at bedtime) Active   glipiZIDE 10 mg oral tablet 1 tab(s) orally 2 times a day Active  oxybutynin extended release 10 mg/24 hr oral tablet, extended release 1 tab(s) orally once a day Active  atorvastatin 20 mg oral tablet 1 tab(s) orally once a day (at bedtime) Active  Januvia 100 mg oral tablet 1 tab(s) orally once a day Active  Aspirin Low Dose 81 mg oral delayed release tablet 1 tab(s) orally once a day Active  fluphenazine decanoa 25 50 milligram(s) intramuscular  every 21 days  Active   Lab Results:  Hepatic:  28-Feb-16 18:00   Bilirubin, Total 0.3  Alkaline Phosphatase 87  SGPT (ALT) 39  SGOT (AST)  39  Total Protein, Serum 8.1  Albumin, Serum  3.2  Routine Micro:  28-Feb-16 18:00   Micro Text Report BLOOD CULTURE   COMMENT                   NO GROWTH IN 8-12 HOURS   ANTIBIOTIC                       Culture Comment NO GROWTH IN 8-12 HOURS  Result(s) reported on 22 Apr 2014 at 03:00AM.  Lab:  28-Feb-16 18:00   pH (ABG) 7.360 (7.350-7.450 NOTE: New Reference Range 09/15/13)  PCO2  62 (32-48 NOTE: New Reference Range 10/02/13)  PO2  52 (83-108 NOTE: New Reference Range 09/15/13)  FiO2 28  Base Excess  7 (-3-3 NOTE: New Reference Range 10/02/13)  HCO3  35.0 (21.0-28.0 NOTE: New Reference Range 09/15/13)  O2 Saturation 90.8  O2 Device CANNULA  Specimen Site (ABG) LT RADIAL  Specimen Type (ABG) ARTERIAL  Patient Temp (ABG) 37.0 (Result(s) reported on 21 Apr 2014 at [facility].)  Lactic Acid, Arterial, Cardiopulmonary  1.5 (0.3-0.8 NOTE: New Reference Range 10/02/13)  Routine Chem:  28-Feb-16 18:00   Glucose, Serum  271  BUN  29  Creatinine (comp)  1.64  Sodium, Serum 139  Potassium, Serum 4.1  Chloride, Serum 99  CO2, Serum  33  Calcium (Total), Serum  8.4  Anion Gap 7  Osmolality (calc) 293  eGFR (African American)  40  eGFR (Non-African American)  33 (eGFR values <52m/min/1.73 m2 may be an indication of chronic kidney disease (CKD). Calculated eGFR, using the MRDR Study  equation, is useful in  patients with stable renal function. The eGFR calculation will not be reliable in acutely ill patients when serum creatinine is changing rapidly. It is not useful in patients on dialysis. The eGFR calculation may not be applicable to patients at the low and high extremes of body sizes, pregnant women, and vegetarians.)  B-Type Natriuretic Peptide ([facility]) 119 (Result(s) reported on 21 Apr 2014 at 07:11PM.)  Result Comment POTASSIUM/BUN/AST - Slight hemolysis, interpret results with  - caution.  Result(s) reported on 21 Apr 2014 at 07:11PM.  Cardiac:  28-Feb-16 18:00   Troponin I < 0.02 (0.00-0.05 0.05 ng/mL  or less: NEGATIVE  Repeat testing in 3-6 hrs  if clinically indicated. >0.05 ng/mL: POTENTIAL  MYOCARDIAL INJURY. Repeat  testing in 3-6 hrs if  clinically indicated. NOTE: An increase or decrease  of 30% or more on serial  testing suggests a  clinically important change)  CK, Total  386  CPK-MB, Serum 2.1 (Result(s) reported on 21 Apr 2014 at 07:11PM.)  Routine Coag:  28-Feb-16 18:00   Prothrombin 13.0 (11.4-15.0 NOTE: New Reference Range  03/22/14)  INR 1.0 (INR reference interval applies to patients on anticoagulant therapy. A single INR therapeutic range for coumarins is not optimal for all indications; however, the suggested range for most indications is 2.0 - 3.0. Exceptions to the INR Reference Range may include: Prosthetic heart valves, acute myocardial infarction, prevention of myocardial infarction, and combinations of aspirin and anticoagulant. The need for a higher or lower target INR must be assessed individually. Reference: The Pharmacology and Management of the Vitamin K  antagonists: the seventh ACCP Conference on Antithrombotic and Thrombolytic Therapy. TMHDQ.2229 Sept:126 (3suppl): [id number]. A HCT value >55% may artifactually increase the PT.  In one study,  the increase was an average of 25%. Reference:  "Effect on Routine and  Special Coagulation Testing Values of Citrate Anticoagulant Adjustment in Patients with High HCT Values." American Journal of Clinical Pathology 2006;126:400-405.)  Routine Hem:  28-Feb-16 18:00   WBC (CBC)  15.2  RBC (CBC) 4.87  Hemoglobin (CBC) 13.8  Hematocrit (CBC) 42.8  Platelet Count (CBC) 250 (Result(s) reported on 21 Apr 2014 at 06:19PM.)  MCV 88  MCH 28.4  MCHC 32.3  RDW 14.1   EKG:  EKG Interp. by me   Interpretation EKG shows NSR, 70s bpm,  lateral TWI, no change from prior EKG   Radiology Results: XRay:    28-Feb-16 18:00, Chest Portable Single View  Chest Portable Single View   REASON FOR EXAM:    Chest Pain  COMMENTS:       PROCEDURE: DXR - DXR PORTABLE CHEST SINGLE VIEW  - Apr 21 2014  6:00PM     CLINICAL DATA:  Acute onset of shortness of breath. Found on floor.  Lethargy. Initial encounter.    EXAM:  PORTABLE CHEST - 1 VIEW    COMPARISON:  Chest radiograph from 04/17/2014    FINDINGS:  The lungs are hypoexpanded. Vascular crowding and vascular  congestion are seen. Bibasilar airspace opacities may reflect  pneumonia or mild interstitial edema. No definite pleural effusion  or pneumothorax is seen.    The cardiomediastinal silhouette is borderline enlarged. No acute  osseous abnormalities are identified.     IMPRESSION:  Lungs hypoexpanded. Vascular congestion and borderline cardiomegaly.  Bibasilar airspace opacities may reflect pneumonia or mild  interstitial edema, given the patient's symptoms.      Electronically Signed    By: Garald Balding M.D.    On: 04/21/2014 18:25         Verified By: JEFFREY . CHANG, M.D.,    Haldol: Swelling  Metformin: GI Distress  Vital Signs/Nurse's Notes: **Vital Signs.:   29-Feb-16 05:00  Pulse Pulse 61  Respirations Respirations 18  Systolic BP Systolic BP 98  Diastolic BP (mmHg) Diastolic BP (mmHg) 55  Mean BP 69  Pulse Ox % Pulse Ox % 100  Oxygen Delivery Nasal Cannula    Impression 69  year old female with history of  hypertension, schizophrenia, diabetes, and obesity who presents to [facility] overnight with a 4-5 month history of increasing SOB and bilateral  lower extremity edema.  Previously seen by [facility]. patient reports that she wants to change to [facility]. Reason unclear.  1) mental status changes: presenting with significant confusion, hypoxia, elevated CO2 Etiology not clear, creatinine and BUN elevated concerning for dehydration CXR with diffuse pulm edema,  Does not have sx suggestive of PNA, no cough. WBC corrected. hypoxia and elevated CO2 on arrival --Still very sleepy this AM, came off bipap at 6 AM, difficult to arouse this AM, will wake to sternal rub but then right back to sleep. --Will order repeat ABG off bipap. --need to determine if there is overuse of sedative medications at home? there was a problem 2 years ago. or if she is profoundly sleepy from being aawake overnight. ABG can guide further management.   2. 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 Difficult to manage as ideally she will not do well when she is dehydrated, LV cavity obliteration Needs Ca channel blocker, slow heart rate, avoidance of overuse of lasix to minimize LVOT obstruction and higher gradient  4. DM2: poorly controlled. Per medicine  5. SOB: etiology not clear, suspect secondary to change in mental status, hypoxia, poor respiratory effort.possible OSA required Bipap overnight for respirator support, still very sleepy this AM. Lasix x 1 given last night,  will recheck ABG. --She is going to have profound diastolic dysfunction and clinical  signs of this given her severe LVH. Will probably need to treat with lasix periodically for SOB, though not on a regular basis. Lab work suggests prerenal state, worse yesterday compared to several days ago at the time of discharge.  Unclear at this time if she was taking lasix at home.   6. OSA possibly contributing to elevated CO2 she is not wearing this as "she does not like it"   Electronic Signatures: Rise Mu (PA-C)  (Signed 29-Feb-16 08:04)  Authored: General Aspect/Present Illness, Home Medications, Allergies Ida Rogue (MD)  (Signed 29-Feb-16 08:54)  Authored: General Aspect/Present Illness, History and Physical Exam, Review of System, Family & Social History, Past Medical History, Home Medications, Labs, EKG , Radiology, Vital Signs/Nurse's Notes, Impression/Plan  Co-Signer: General Aspect/Present Illness, Home Medications, Allergies   Last Updated: 29-Feb-16 08:54 by Ida Rogue (MD)

## 2014-06-23 NOTE — H&P (Signed)
PATIENT NAME:  [name] [name], [name] [name] MR#:  [id number] DATE OF BIRTH:  03/23/1945  DATE OF ADMISSION:  04/15/2014  PRIMARY CARE PHYSICIAN: Jaquelyn Bitter B. Brynda Greathouse, MD   REFERRING PHYSICIAN: Briant Sites. Joni Fears, MD  CHIEF COMPLAINT: Shortness of breath for 4 months, worsening 1 week.   HISTORY OF PRESENT ILLNESS: A 69 year old African-American female with a history of hypertension, schizophrenia, diabetes presented to the ED with the above chief complaint. The patient is alert, awake, oriented. According to the patient and the patient's son, the patient has had the shortness of breath on and off for the past 4 months. In addition, the patient has increasing bilateral lower extremity edema. The patient's symptom have been worsening for the past 1 week. The patient has orthopnea, using 4 pillows at night to sleep, and nocturnal dyspnea. The patient urinates a lot and gained weight for the past few months. The patient also complains of chest discomfort but denies any palpitation, denies any fever or chills.   PAST MEDICAL HISTORY: Schizophrenia affective disorder, sleep apnea, chronic peripheral edema, GERD, hypertension, diabetes.   SOCIAL HISTORY: No smoking or drinking or illicit drugs.   FAMILY HISTORY: Hypertension.  ALLERGIES: HALDOL, METFORMIN.  HOME MEDICATION: 1.  Zofran ODT 4 mg p.o. every 8 hours p.r.n. 2.  Zantac 150 mg p.o. at bedtime.  3.  Trazodone 100 mg 2 tablets once a day at bedtime.  4.  Tolterodine 4 mg p.o. once a day.  5.  Prolixin 5 mg p.o. b.i.d. for psychosis.  6.  Olanzapine 15 mg p.o. b.i.d. 7.  Norco 5/325 mg p.o. every 6 hours p.r.n. 8.  Lisinopril 40 mg p.o. b.i.d. 9.  Glipizide 5 mg p.o. b.i.d. 10.  Lasix 20 mg p.o. b.i.d. 11.  Clonidine 1 patch transdermal once a week. 12.  Coreg 25 mg p.o. b.i.d.  13.  Benztropine 1 mg p.o. b.i.d. 14.  Aspirin 81 mg p.o. daily.  REVIEW OF SYSTEMS:  CONSTITUTIONAL: The patient denies any fever or chills, but has a headache, dizziness,  and weakness.  EYES: No double vision or blurred vision.  ENT: No postnasal drip, slurred speech, or dysphagia.  CARDIOVASCULAR: No chest pain, palpitation, but has orthopnea, nocturnal dyspnea, and leg edema.  PULMONARY: Positive for cough, sputum, shortness of breath, but no hemoptysis.  GASTROINTESTINAL: No abdominal pain, nausea, vomiting, diarrhea. No melena or bloody stool.  GENITOURINARY: No dysuria, hematuria, or incontinence, but has urine frequency.  NEUROLOGY: No syncope, loss of consciousness, or seizure.  ENDOCRINOLOGY: Positive for polyuria and polydipsia, but has no heat or cold intolerance.  HEMATOLOGY: No easy bruising or bleeding.   PHYSICAL EXAMINATION:  VITAL SIGNS: Temperature 98.2, blood pressure 160/102, pulse is 78, oxygen saturation 95% on room air, respirations 26.  GENERAL: The patient is alert, awake, oriented, in no acute distress.  HEENT: Pupils round, equal, reactive to light and accommodation. Moist oral mucosa. Clear oropharynx.  NECK: Supple. No JVD or carotid bruit. No lymphadenopathy. No thyromegaly.  CARDIOVASCULAR: S1, S2. Regular rate, rhythm. No murmurs, rubs, or gallops.  PULMONARY: Bilateral air entry. Bilateral basilar rales. No wheezing. No use of accessory muscles to breathe. ABDOMEN: Soft. No distention or tenderness. No organomegaly. Bowel sounds present.  EXTREMITIES: Bilateral lower extremity edema 2+. No clubbing or cyanosis. No calf tenderness. Bilateral pedal pulses present.  SKIN: No rash or jaundice.  NEUROLOGY: A and O x 3. No focal deficit. Power 3 to 4 out of 5. Sensation intact.   LABORATORY DATA: Glucose 174, BUN 11,  creatinine 1.04. Electrolytes are normal. Troponin less than 0.02. WBC 11.6, hemoglobin 14.1, platelets 261,000.  IMAGING: Chest x-ray showed mild cardiac enlargement and vascular congestion.   IMPRESSIONS:  1.  Acute new-onset congestive heart failure.  2.  Hypertension malignancy.  3.  Diabetes.  4.  Sleep apnea.    PLAN OF TREATMENT:  1.  The patient will be admitted to the medical floor. I will start CHF protocol with telemetry monitor. In addition, I will start Lasix 40 mg IV b.i.d. and get an echocardiograph. In addition, I will get a cardiology consult and give DuoNeb.  2.  For hypertension, we will continue the patient's hypertension medication, give hydralazine IV p.r.n.  3.  For diabetes, I will start sliding scale, check hemoglobin A1c.   I discussed the patient's condition and the plan of treatment with the patient and the patient's son.  CODE STATUS: The patient wants full code.   TIME SPENT: About 56 minutes.    ____________________________ Demetrios Loll, MD qc:ST D: 04/16/2014 00:06:59 ET T: 04/16/2014 00:36:05 ET JOB#: [id number]  cc: Demetrios Loll, MD, <Dictator> Demetrios Loll MD ELECTRONICALLY SIGNED 04/17/2014 11:27

## 2014-06-23 NOTE — Discharge Summary (Signed)
PATIENT NAME:  [name] [name], PLACZEK MR#:  [id number] DATE OF BIRTH:  1945/10/17  DATE OF ADMISSION:  05/06/2014 DATE OF DISCHARGE:  05/08/2014  DISCHARGE DIAGNOSES: 1.  Acute on chronic respiratory failure with hypercapnia due to noncompliance of BiPAP/CPAP at home for obstructive sleep apnea leading to recurrent hypercapnia and readmissions.  2.  Acute kidney failure, resolved with hydration, likely prerenal.  3.  Elevated troponin, likely due to supply demand ischemia.   SECONDARY DIAGNOSIS:    diastolic congestive heart failure, EF 60%; obstructive sleep apnea, noncompliant with CPAP or BiPAP therapy; gastroesophageal reflux disease without esophagitis; hypertension, essential; type 2 diabetes, insulin requiring.   CONSULTATIONS:  1.  Pulmonary, Dr. Devona Konig.  2.  Cardiology, Dr. Ida Rogue.   PROCEDURES AND RADIOLOGY: Chest x-ray on 03/13 showed no acute cardiopulmonary disease.   HISTORY AND SHORT HOSPITAL COURSE: The patient is a 69 year old female with above mentioned medical problems who was admitted for shortness of breath, was found to have acute on chronic respiratory failure with hypercapnia thought to be secondary to noncompliance with BiPAP/CPAP. Please see Dr. Ardith Dark dictated history and physical for further details. The patient was started on BiPAP, which he tolerated and has CO2 that started coming down. Cardiology consultation was obtained with  Dr. Ida Rogue who recommended counseling her for use of BiPAP or CPAP at home as she was noncompliant with same. Extensive counseling was done. The patient was also qualified for trilogy and was set up for that by care management. Cardiology recommended stopping long-acting nitroglycerin. Pulmonary consultation was obtained with Dr. Devona Konig who agreed with above management. The patient was feeling much better by the 16th of March and is being discharged to rehab facility in stable condition.   VITAL SIGNS: On the date  of discharge, her vital signs were as follows: Temperature 97.3, heart rate 72 per minute, respirations 20 per minute, blood pressure 132/85. She is saturating 98% on room air.   PERTINENT PHYSICAL EXAMINATION ON THE DATE OF DISCHARGE:  CARDIOVASCULAR: S1, S2 normal. No murmurs, rubs or gallops.  LUNGS: Clear to auscultation bilaterally. No wheezing, rales, rhonchi, crepitation.  ABDOMEN: Soft, benign.  NEUROLOGIC: Nonfocal examination.  All other physical examination remained at baseline.   DISCHARGE MEDICATIONS:   Medication Instructions  ranitidine 150 mg oral tablet  1 tab(s) orally 2 times a day   fluphenazine 10 mg oral tablet  1 tab(s) orally once a day (at bedtime)   glipizide 10 mg oral tablet  1 tab(s) orally 2 times a day   oxybutynin extended release 10 mg/24 hr oral tablet, extended release  1 tab(s) orally once a day   atorvastatin 20 mg oral tablet  1 tab(s) orally once a day (at bedtime)   januvia 100 mg oral tablet  1 tab(s) orally once a day   aspirin low dose 81 mg oral delayed release tablet  1 tab(s) orally once a day   lorazepam 0.5 mg oral tablet  1 tab(s) orally 3 times a day, As Needed - for Anxiety, Nervousness   olanzapine 15 mg oral tablet, disintegrating  1 tab(s) orally 2 times a day   benztropine 1 mg oral tablet  1 tab(s) orally 2 times a day   trazodone 100 mg oral tablet  1 tab(s) orally once a day (at bedtime), As Needed - for Inability to Sleep   lisinopril 5 mg oral tablet  1 tab(s) orally once a day   insulin glargine 100 units/ml subcutaneous solution  15 unit(s) subcutaneous once a day (at bedtime)   verapamil 40 mg oral tablet  1 tab(s) orally every 6 hours   prednisone 10 mg oral tablet  Start at 60 mg and taper by 10 mg daily until complete     DISCHARGE DIET: Low sodium, low fat, low cholesterol, 1800 ADA.   DISCHARGE ACTIVITY: As tolerated.   DISCHARGE INSTRUCTIONS AND FOLLOWUP: The patient was instructed to follow up with her primary care  physician, Dr. Nicky Pugh in 1 to 2 weeks. She will need followup with Dr. Ida Rogue from [facility] cardiology in 2 to 4 weeks. She remains at high risk for readmission.     ____________________________ Lucina Mellow. Manuella Ghazi, MD vss:at D: 05/08/2014 11:56:54 ET T: 05/08/2014 12:39:14 ET JOB#: [id number]  cc: Geanette Buonocore S. Manuella Ghazi, MD, <Dictator> Mikeal Hawthorne. Brynda Greathouse, MD Minna Merritts, MD Allyne Gee, MD Lucina Mellow [facility] MD ELECTRONICALLY SIGNED 05/16/2014 12:05

## 2014-06-23 NOTE — Consult Note (Signed)
Psychiatry: Very briefly saw patient.  She was on her way to ultrasound.  He was no longer on the BiPAP.  Patient made eye contact with me and said hello spontaneously.  She was not agitated or hostile at the time I interacted with her.  I didn't have time to do more complete mental status. the current doses of olanzapine.  I will follow-up after the weekend if she is still here.  If she is medically getting better and is consistently less sedated, I would recommend going up to 15 mg twice a day on the olanzapine.  Electronic Signatures: Clapacs, Madie Reno (MD)  (Signed on 04-Mar-16 16:54)  Authored  Last Updated: 04-Mar-16 16:54 by Gonzella Lex (MD)

## 2014-06-24 NOTE — Discharge Summary (Addendum)
PATIENT NAME:  [name] [name], [name] [name] MR#:  [id number] DATE OF BIRTH:  05/29/1945  DATE OF ADMISSION:  06/19/2014 DATE OF DISCHARGE:  06/21/2014  DISCHARGE DIAGNOSES:  1.  Acute exacerbation of chronic obstructive pulmonary disease.  2.  Obstructive sleep apnea.  3.  Schizophrenia.   CONDITION AT THE TIME OF DISCHARGE:  Satisfactory.   CODE STATUS:  FULL CODE.   DISCHARGE MEDICATIONS:  Ranitidine 150 mg p.o. b.i.d., fluphenazine 10 mg 1 tablet p.o. once daily at bedtime, glipizide 10 mg 2 times a day, oxybutynin extended release 10 mg 1 tablet p.o. once daily, atorvastatin 20 mg 1 tablet p.o. once daily at bedtime, Januvia 100 mg once daily, aspirin 81 mg once daily, olanzapine 150 mg p.o. b.i.d., benztropine 1 mg p.o. 2 times a day, trazodone 100 mg p.o. once daily as needed for insomnia, lisinopril 5 mg once daily, Lantus 15 units subcutaneously once daily at bedtime, verapamil 40 mg p.o. every 6 hours, lorazepam 0.5 mg p.o. 3 times a day as needed for anxiety, prednisone tapping dose 10 mg tablets, start with 60 mg and taper x 10 mg per day until all tablets are completed, levofloxacin 750 mg 1 tablet p.o. every 24 hours for 5 more days, aspirin 81 mg p.o. once daily, Advair Diskus 250/50 mcg 1 puff inhalation 2 times a day, Proventil 2 puffs inhalation every 4 hours as needed for shortness of breath or wheezing.   DISPOSITION:  Discharge home with home health, physical therapy, occupational therapy, nurse, nursing aide, social worker. Continue oxygen 2 liters via nasal cannula.   DIET:  Low-sodium, carbohydrate-controlled diet, regular consistency.   ACTIVITY:  As recommended by physical therapy.   FOLLOWUP:  With primary care physician in 1 week. Also follow up with pulmonology, Dr. Devona Konig, in 1 week. The patient needs outpatient sleep study in 1 to 2 weeks. Follow up with ACT/outpatient psychiatry. Outpatient ophthalmology in 1 to 2 weeks for cataract surgery. The patient needs outpatient  sleep study.   BRIEF HISTORY AND PHYSICAL AND HOSPITAL COURSE:  The patient is a 69 year old female who came into the ED with a chief complaint of shortness of breath. The patient was  found to be hypercapnic with CO2 at 58, but not acidotic. She was placed on BiPAP and admitted to the hospital. Please review history and physical for details.  1.  Acute respiratory failure with hypercapnia, improved with BiPAP:  The patient was treated for an acute exacerbation of COPD and seen by Dr. Devona Konig. The patient was given IV Solu-Medrol, nebulizer treatments, and antibiotic levofloxacin. As clinically her condition is better, steroids were changed to p.o. and tapered. The patient's shortness of breath is significantly improved, ambulating in the hallway without any difficulty. Plan is to continue home oxygen 2 liters via nasal cannula.  2.  Possible obstructive sleep apnea:  According to the history and physical at the time of admission she is noncompliant with CPAP. As per my discussion with the son, she never had a sleep study done. I recommended the patient to follow up pulmonology as an outpatient to get outpatient sleep study. The patient will be benefited with CPAP as the patient was falling asleep several times during the hospital course.  3.  History of schizophrenia:  Plan is to continue olanzapine. The patient was evaluated by psychiatry, Dr. Franchot Mimes. She has recommended to continue current medications and no other changes were made.  4.  Generalized weakness:  Seen by physical therapy. They have recommended  home with home PT. Care management was consulted. Home PT was arranged. Home health was arranged.  5.  She has history of cataracts. She needs outpatient followup with ophthalmology for cataract surgery.  6.  For hypertension, diabetes mellitus, continue her home medications, and she is discharged home today in satisfactory condition.   PHYSICAL EXAMINATION:  VITAL SIGNS:  Temperature 97.6,  pulse 80, respirations 18, blood pressure 167/99, pulse oximetry 95% on 2 liters.  GENERAL APPEARANCE:  Not in acute distress. Moderately built and nourished.  HEENT:  Normocephalic, atraumatic. Pupils are equal, reacting to light and accommodation. No scleral icterus. No conjunctival injection. No sinus tenderness. No postnasal drip. Moist mucous membranes.  NECK:  Supple. No JVD. No thyromegaly. Range of motion is intact.  LUNGS:  Clear to auscultation bilaterally. No accessory muscle use. There is no anterior chest wall tenderness on palpation.  CARDIAC:  S1, S2 normal. Regular rate and rhythm.  GASTROINTESTINAL:  Soft. Bowel signs as positive in all 4 quadrants. Obese, nontender, nondistended. No masses. NEUROLOGIC:  Awake, alert, oriented x 3. Cranial nerves II through XII are intact. She is following verbal commands. Motor and sensory are intact. Reflexes are 2+.  EXTREMITIES: No cyanosis. No clubbing.  LABORATORIES AND IMAGING STUDIES:  WBC at the time of admission on April 26 was 11.1. The rest of the CBC was normal. Troponin less than 0.03 x 3. TSH is normal. Hemoglobin A1c at 9.7. BMP:  Glucose 219. The rest of the BMP was normal. Chest x-ray PA and lateral views:  No active cardiopulmonary disease.   The diagnoses and plan of care was discussed in detail with the patient and her son over the phone. He is aware of the plan. All his questions were answered.   TOTAL TIME SPENT ON THE DISCHARGE: 45 minutes.   ____________________________ Nicholes Mango, MD ag:TT D: 06/21/2014 17:54:42 ET T: 06/22/2014 00:03:43 ET JOB#: [id number]  cc: Nicholes Mango, MD, <Dictator> Mikeal Hawthorne. Brynda Greathouse, MD Allyne Gee, MD Nicholes Mango MD ELECTRONICALLY SIGNED 07/03/2014 14:24

## 2014-06-29 ENCOUNTER — Emergency Department: Payer: Medicare HMO

## 2014-06-29 ENCOUNTER — Inpatient Hospital Stay
Admission: EM | Admit: 2014-06-29 | Discharge: 2014-07-03 | DRG: 189 | Disposition: A | Discharge status: Home / Self Care | Payer: Medicare HMO | Attending: Specialist | Admitting: Specialist

## 2014-06-29 DIAGNOSIS — Z9981 Dependence on supplemental oxygen: Secondary | ICD-10-CM | POA: Diagnosis not present

## 2014-06-29 DIAGNOSIS — T380X5A Adverse effect of glucocorticoids and synthetic analogues, initial encounter: Secondary | ICD-10-CM | POA: Diagnosis present

## 2014-06-29 DIAGNOSIS — F209 Schizophrenia, unspecified: Secondary | ICD-10-CM | POA: Diagnosis present

## 2014-06-29 DIAGNOSIS — Z7952 Long term (current) use of systemic steroids: Secondary | ICD-10-CM | POA: Diagnosis not present

## 2014-06-29 DIAGNOSIS — Z794 Long term (current) use of insulin: Secondary | ICD-10-CM

## 2014-06-29 DIAGNOSIS — E780 Pure hypercholesterolemia: Secondary | ICD-10-CM | POA: Diagnosis present

## 2014-06-29 DIAGNOSIS — I10 Essential (primary) hypertension: Secondary | ICD-10-CM | POA: Diagnosis present

## 2014-06-29 DIAGNOSIS — G4733 Obstructive sleep apnea (adult) (pediatric): Secondary | ICD-10-CM | POA: Diagnosis present

## 2014-06-29 DIAGNOSIS — I5032 Chronic diastolic (congestive) heart failure: Secondary | ICD-10-CM | POA: Diagnosis present

## 2014-06-29 DIAGNOSIS — J9600 Acute respiratory failure, unspecified whether with hypoxia or hypercapnia: Secondary | ICD-10-CM

## 2014-06-29 DIAGNOSIS — J441 Chronic obstructive pulmonary disease with (acute) exacerbation: Secondary | ICD-10-CM | POA: Diagnosis present

## 2014-06-29 DIAGNOSIS — E872 Acidosis: Secondary | ICD-10-CM | POA: Diagnosis present

## 2014-06-29 DIAGNOSIS — J9621 Acute and chronic respiratory failure with hypoxia: Principal | ICD-10-CM | POA: Diagnosis present

## 2014-06-29 DIAGNOSIS — Z7982 Long term (current) use of aspirin: Secondary | ICD-10-CM | POA: Diagnosis not present

## 2014-06-29 DIAGNOSIS — M1990 Unspecified osteoarthritis, unspecified site: Secondary | ICD-10-CM | POA: Diagnosis present

## 2014-06-29 DIAGNOSIS — Z8249 Family history of ischemic heart disease and other diseases of the circulatory system: Secondary | ICD-10-CM

## 2014-06-29 DIAGNOSIS — Z87891 Personal history of nicotine dependence: Secondary | ICD-10-CM

## 2014-06-29 DIAGNOSIS — Z79899 Other long term (current) drug therapy: Secondary | ICD-10-CM

## 2014-06-29 DIAGNOSIS — E1165 Type 2 diabetes mellitus with hyperglycemia: Secondary | ICD-10-CM | POA: Diagnosis present

## 2014-06-29 DIAGNOSIS — G9341 Metabolic encephalopathy: Secondary | ICD-10-CM | POA: Diagnosis present

## 2014-06-29 DIAGNOSIS — Z658 Other specified problems related to psychosocial circumstances: Secondary | ICD-10-CM

## 2014-06-29 DIAGNOSIS — E662 Morbid (severe) obesity with alveolar hypoventilation: Secondary | ICD-10-CM | POA: Diagnosis present

## 2014-06-29 HISTORY — DX: Heart failure, unspecified: I50.9

## 2014-06-29 HISTORY — DX: Anxiety disorder, unspecified: F41.9

## 2014-06-29 LAB — CBC WITH DIFFERENTIAL/PLATELET
BASOS ABS: 0.1 10*3/uL (ref 0–0.1)
BASOS PCT: 1 %
EOS PCT: 1 %
Eosinophils Absolute: 0.1 10*3/uL (ref 0–0.7)
HEMATOCRIT: 43.5 % (ref 35.0–47.0)
Hemoglobin: 13.7 g/dL (ref 12.0–16.0)
Lymphocytes Relative: 27 %
Lymphs Abs: 3.1 10*3/uL (ref 1.0–3.6)
MCH: 27.4 pg (ref 26.0–34.0)
MCHC: 31.6 g/dL — AB (ref 32.0–36.0)
MCV: 86.9 fL (ref 80.0–100.0)
Monocytes Absolute: 1.2 10*3/uL — ABNORMAL HIGH (ref 0.2–0.9)
Monocytes Relative: 11 %
Neutro Abs: 6.8 10*3/uL — ABNORMAL HIGH (ref 1.4–6.5)
Neutrophils Relative %: 60 %
Platelets: 259 10*3/uL (ref 150–440)
RBC: 5.01 MIL/uL (ref 3.80–5.20)
RDW: 14.6 % — AB (ref 11.5–14.5)
WBC: 11.3 10*3/uL — ABNORMAL HIGH (ref 3.6–11.0)

## 2014-06-29 LAB — CBC
HCT: 45.9 % (ref 35.0–47.0)
HEMOGLOBIN: 14.8 g/dL (ref 12.0–16.0)
MCH: 28 pg (ref 26.0–34.0)
MCHC: 32.2 g/dL (ref 32.0–36.0)
MCV: 87.1 fL (ref 80.0–100.0)
Platelets: 264 10*3/uL (ref 150–440)
RBC: 5.27 MIL/uL — ABNORMAL HIGH (ref 3.80–5.20)
RDW: 14.9 % — ABNORMAL HIGH (ref 11.5–14.5)
WBC: 13.3 10*3/uL — AB (ref 3.6–11.0)

## 2014-06-29 LAB — COMPREHENSIVE METABOLIC PANEL
ALT: 23 U/L (ref 14–54)
AST: 22 U/L (ref 15–41)
Albumin: 3.7 g/dL (ref 3.5–5.0)
Alkaline Phosphatase: 71 U/L (ref 38–126)
Anion gap: 9 (ref 5–15)
BUN: 16 mg/dL (ref 6–20)
CALCIUM: 8.9 mg/dL (ref 8.9–10.3)
CO2: 33 mmol/L — AB (ref 22–32)
Chloride: 98 mmol/L — ABNORMAL LOW (ref 101–111)
Creatinine, Ser: 0.89 mg/dL (ref 0.44–1.00)
GFR calc non Af Amer: >60 mL/min (ref >60)
Glucose, Bld: 318 mg/dL — ABNORMAL HIGH (ref 65–99)
Potassium: 3.7 mmol/L (ref 3.5–5.1)
Sodium: 140 mmol/L (ref 135–145)
TOTAL PROTEIN: 7.3 g/dL (ref 6.5–8.1)
Total Bilirubin: 0.3 mg/dL (ref 0.3–1.2)

## 2014-06-29 LAB — GLUCOSE, CAPILLARY
GLUCOSE-CAPILLARY: 247 mg/dL — AB (ref 70–99)
GLUCOSE-CAPILLARY: 379 mg/dL — AB (ref 70–99)

## 2014-06-29 LAB — TROPONIN I

## 2014-06-29 LAB — BRAIN NATRIURETIC PEPTIDE: B Natriuretic Peptide: 40 pg/mL (ref 0.0–100.0)

## 2014-06-29 LAB — CREATININE, SERUM
Creatinine, Ser: 0.81 mg/dL (ref 0.44–1.00)
GFR calc non Af Amer: >60 mL/min (ref >60)

## 2014-06-29 LAB — LACTIC ACID, PLASMA: Lactic Acid, Venous: 2 mmol/L (ref 0.5–2.0)

## 2014-06-29 MED ORDER — ASPIRIN EC 81 MG PO TBEC
81.0000 mg | DELAYED_RELEASE_TABLET | Freq: Every day | ORAL | Status: DC
  Administered 2014-06-29 – 2014-07-03 (×5): 81 mg via ORAL
  Filled 2014-06-29 (×5): qty 1

## 2014-06-29 MED ORDER — DOCUSATE SODIUM 100 MG PO CAPS: 100.0000 mg | ORAL_CAPSULE | Freq: Two times a day (BID) | ORAL | Status: DC | PRN

## 2014-06-29 MED ORDER — LORAZEPAM 0.5 MG PO TABS
0.5000 mg | ORAL_TABLET | Freq: Four times a day (QID) | ORAL | Status: DC | PRN
  Administered 2014-06-29 – 2014-07-02 (×2): 0.5 mg via ORAL
  Filled 2014-06-29 (×2): qty 1

## 2014-06-29 MED ORDER — HYDRALAZINE HCL 20 MG/ML IJ SOLN
INTRAMUSCULAR | Status: AC
  Administered 2014-06-29: 10 mg
  Filled 2014-06-29: qty 1

## 2014-06-29 MED ORDER — OLANZAPINE 5 MG PO TBDP
15.0000 mg | ORAL_TABLET | Freq: Two times a day (BID) | ORAL | Status: DC
  Administered 2014-06-29 – 2014-07-03 (×7): 15 mg via ORAL
  Filled 2014-06-29 (×9): qty 3

## 2014-06-29 MED ORDER — INSULIN GLARGINE 100 UNIT/ML ~~LOC~~ SOLN
15.0000 [IU] | Freq: Every day | SUBCUTANEOUS | Status: DC
  Administered 2014-06-29: 15 [IU] via SUBCUTANEOUS
  Filled 2014-06-29 (×2): qty 0.15

## 2014-06-29 MED ORDER — METHYLPREDNISOLONE SODIUM SUCC 125 MG IJ SOLR: 60.0000 mg | Freq: Three times a day (TID) | INTRAMUSCULAR | Status: DC

## 2014-06-29 MED ORDER — OXYBUTYNIN CHLORIDE ER 10 MG PO TB24
10.0000 mg | ORAL_TABLET | Freq: Every day | ORAL | Status: DC
  Administered 2014-06-29 – 2014-07-02 (×3): 10 mg via ORAL
  Filled 2014-06-29 (×4): qty 1

## 2014-06-29 MED ORDER — INSULIN ASPART 100 UNIT/ML ~~LOC~~ SOLN
0.0000 [IU] | Freq: Three times a day (TID) | SUBCUTANEOUS | Status: DC
  Administered 2014-06-30: 9 [IU] via SUBCUTANEOUS
  Filled 2014-06-29: qty 9

## 2014-06-29 MED ORDER — ACETAMINOPHEN 650 MG RE SUPP: 650.0000 mg | Freq: Four times a day (QID) | RECTAL | Status: DC | PRN

## 2014-06-29 MED ORDER — HEPARIN SODIUM (PORCINE) 5000 UNIT/ML IJ SOLN
5000.0000 [IU] | Freq: Three times a day (TID) | INTRAMUSCULAR | Status: DC
  Administered 2014-06-29 – 2014-07-03 (×11): 5000 [IU] via SUBCUTANEOUS
  Filled 2014-06-29 (×12): qty 1

## 2014-06-29 MED ORDER — FAMOTIDINE 20 MG PO TABS
20.0000 mg | ORAL_TABLET | Freq: Two times a day (BID) | ORAL | Status: DC
  Administered 2014-06-29 – 2014-07-03 (×7): 20 mg via ORAL
  Filled 2014-06-29 (×8): qty 1

## 2014-06-29 MED ORDER — HYDRALAZINE HCL 20 MG/ML IJ SOLN: 10.0000 mg | Freq: Four times a day (QID) | INTRAMUSCULAR | Status: DC | PRN

## 2014-06-29 MED ORDER — LISINOPRIL 10 MG PO TABS
10.0000 mg | ORAL_TABLET | Freq: Every day | ORAL | Status: DC
  Administered 2014-06-29 – 2014-07-03 (×5): 10 mg via ORAL
  Filled 2014-06-29 (×5): qty 1

## 2014-06-29 MED ORDER — METHYLPREDNISOLONE SODIUM SUCC 125 MG IJ SOLR
60.0000 mg | Freq: Three times a day (TID) | INTRAMUSCULAR | Status: DC
  Administered 2014-06-29 – 2014-06-30 (×3): 60 mg via INTRAVENOUS
  Filled 2014-06-29 (×3): qty 2

## 2014-06-29 MED ORDER — BENZTROPINE MESYLATE 1 MG PO TABS
1.0000 mg | ORAL_TABLET | Freq: Two times a day (BID) | ORAL | Status: DC
  Administered 2014-06-29 – 2014-07-03 (×8): 1 mg via ORAL
  Filled 2014-06-29 (×9): qty 1

## 2014-06-29 MED ORDER — INSULIN ASPART 100 UNIT/ML ~~LOC~~ SOLN
0.0000 [IU] | Freq: Every day | SUBCUTANEOUS | Status: DC
  Administered 2014-06-29: 5 [IU] via SUBCUTANEOUS
  Filled 2014-06-29: qty 5

## 2014-06-29 MED ORDER — METHYLPREDNISOLONE SODIUM SUCC 125 MG IJ SOLR
60.0000 mg | Freq: Three times a day (TID) | INTRAMUSCULAR | Status: DC
  Administered 2014-06-29: 60 mg via INTRAVENOUS

## 2014-06-29 MED ORDER — VERAPAMIL HCL 40 MG PO TABS
40.0000 mg | ORAL_TABLET | Freq: Three times a day (TID) | ORAL | Status: DC
  Administered 2014-06-29 – 2014-07-03 (×12): 40 mg via ORAL
  Filled 2014-06-29 (×16): qty 1

## 2014-06-29 MED ORDER — ACETAMINOPHEN 325 MG PO TABS
650.0000 mg | ORAL_TABLET | Freq: Four times a day (QID) | ORAL | Status: DC | PRN
  Administered 2014-07-01: 650 mg via ORAL
  Filled 2014-06-29: qty 2

## 2014-06-29 MED ORDER — METHYLPREDNISOLONE SODIUM SUCC 125 MG IJ SOLR
INTRAMUSCULAR | Status: AC
  Administered 2014-06-29 (×2): 60 mg via INTRAVENOUS
  Filled 2014-06-29: qty 2

## 2014-06-29 MED ORDER — FLUPHENAZINE HCL 5 MG PO TABS
10.0000 mg | ORAL_TABLET | Freq: Every day | ORAL | Status: DC
  Administered 2014-06-29 – 2014-07-02 (×3): 10 mg via ORAL
  Filled 2014-06-29 (×4): qty 2

## 2014-06-29 MED ORDER — TRAZODONE HCL 100 MG PO TABS
100.0000 mg | ORAL_TABLET | Freq: Every day | ORAL | Status: DC
  Administered 2014-06-29 – 2014-07-02 (×3): 100 mg via ORAL
  Filled 2014-06-29 (×3): qty 1

## 2014-06-29 MED ORDER — ALBUTEROL SULFATE (2.5 MG/3ML) 0.083% IN NEBU: 2.5000 mg | INHALATION_SOLUTION | RESPIRATORY_TRACT | Status: DC | PRN

## 2014-06-29 MED ORDER — IPRATROPIUM-ALBUTEROL 0.5-2.5 (3) MG/3ML IN SOLN
3.0000 mL | RESPIRATORY_TRACT | Status: DC
  Administered 2014-06-29 – 2014-06-30 (×5): 3 mL via RESPIRATORY_TRACT
  Filled 2014-06-29 (×5): qty 3

## 2014-06-29 MED ORDER — ATORVASTATIN CALCIUM 20 MG PO TABS
20.0000 mg | ORAL_TABLET | Freq: Every day | ORAL | Status: DC
Start: 2014-06-29 — End: 2014-07-04
  Administered 2014-06-29 – 2014-07-03 (×5): 20 mg via ORAL
  Filled 2014-06-29 (×5): qty 1

## 2014-06-29 MED ORDER — LINAGLIPTIN 5 MG PO TABS
5.0000 mg | ORAL_TABLET | Freq: Every day | ORAL | Status: DC
  Administered 2014-06-29 – 2014-07-03 (×5): 5 mg via ORAL
  Filled 2014-06-29 (×5): qty 1

## 2014-06-29 MED ORDER — SENNA 8.6 MG PO TABS: 1.0000 | ORAL_TABLET | Freq: Every day | ORAL | Status: DC | PRN

## 2014-06-29 NOTE — ED Provider Notes (Signed)
[facility] Emergency Department Provider Note   ____________________________________________  Time seen800  I have reviewed the triage vital signs and the nursing notes.   HISTORY  Chief Complaint Shortness of Breath       HPI [name] [name] is a 69 y.o. female her son is very frustrated because she has been prescribed CPAP and is unable to get the CPAP he has called these taken a documents over to the CPAP placed she's taken vacuums over to Dr. Brynda Greathouse may still not been able to get the CPAP because she has not had a sleep study height of his best efforts patient still complains of feeling shortness of breath and sleepy A she has some edema in her legs when I walked in her sats on 2 L O2 were 88 go up when she is awake and talking and immediately dropped back down again as she stops talking she denies any other problems including nausea vomiting fever chills diarrhea or chest pain belly pain L     Past Medical History  Diagnosis Date  . DM2 (diabetes mellitus, type 2)   . Hypertension   . Hypercholesteremia   . Schizophrenia   . Osteoarthritis   . Bursitis   . OSA (obstructive sleep apnea)     does not use machine  . Left ventricular outflow tract obstruction     a. echo 03/2014: EF 60-65%, hypernamic LV systolic fxn, mod LVH w/ LVOT gradient estimated at 68 mm Hg w/ valsalva, very small LV internal cavity size, mildly increased LV posterior wall thickness, mild Ao valve scl w/o stenosis, diastolic dysfunction, normal RVSP  . LVH (left ventricular hypertrophy)     a. echo suggests long standing uncontrolled htn. she will not do well when dehydrated, LV cavity obliteration  . Obesity     Patient Active Problem List   Diagnosis Date Noted  . Chronic diastolic CHF (congestive heart failure) 05/15/2014  . Obesity   . LVH (left ventricular hypertrophy)   . Left ventricular outflow tract obstruction   . Can't get food down 03/09/2013  . Acid reflux  03/09/2013  . History of colon polyps 03/09/2013  . Obstructive apnea 03/09/2013  . Absence of bladder continence 03/09/2013  . Renal mass 06/26/2011  . Lytic bone lesion of hip 06/25/2011  . Hypertension 06/24/2011  . Rectal bleeding 06/24/2011  . Schizophrenia 06/24/2011  . Diabetes mellitus 06/24/2011  . Hypokalemia 06/24/2011    Past Surgical History  Procedure Laterality Date  . Tonsillectomy    . Knee reconstruction, medial patellar femoral ligament    . Cholecystectomy    . Colonoscopy  04/2011    [facility] per patient incomplete    Current Outpatient Rx  Name  Route  Sig  Dispense  Refill  . acetaminophen (TYLENOL) 325 MG tablet   Oral   Take 650 mg by mouth every 4 (four) hours as needed.         Marland Kitchen aspirin EC 81 MG tablet   Oral   Take 81 mg by mouth daily.           Marland Kitchen atorvastatin (LIPITOR) 20 MG tablet   Oral   Take 20 mg by mouth daily.         . benztropine (COGENTIN) 1 MG tablet   Oral   Take 1 mg by mouth 2 (two) times daily.         . fluPHENAZine (PROLIXIN) 5 MG tablet   Oral   Take 10  mg by mouth at bedtime.          . fluPHENAZine decanoate (PROLIXIN) 25 MG/ML injection   Intramuscular   Inject 50 mg into the muscle every 14 (fourteen) days. Unknown dose. Given at a [facility], states she will now have ACT team coming to her home to administer         . glipiZIDE (GLUCOTROL) 10 MG tablet   Oral   Take 10 mg by mouth 2 (two) times daily before a meal.         . insulin glargine (LANTUS) 100 UNIT/ML injection   Subcutaneous   Inject 15 Units into the skin at bedtime.         Marland Kitchen lisinopril (PRINIVIL,ZESTRIL) 5 MG tablet   Oral   Take 5 mg by mouth daily.         Marland Kitchen LORazepam (ATIVAN) 0.5 MG tablet   Oral   Take 0.5 mg by mouth every 8 (eight) hours.         Marland Kitchen olanzapine zydis (ZYPREXA) 15 MG disintegrating tablet   Oral   Take 15 mg by mouth 2 (two) times daily.         Marland Kitchen oxybutynin (DITROPAN-XL) 10 MG  24 hr tablet   Oral   Take 10 mg by mouth every morning.         . predniSONE (DELTASONE) 10 MG tablet   Oral   Take 10 mg by mouth daily with breakfast.         . ranitidine (ZANTAC) 150 MG tablet   Oral   Take 150 mg by mouth daily.           . sitaGLIPtin (JANUVIA) 100 MG tablet   Oral   Take 100 mg by mouth daily.         . traZODone (DESYREL) 100 MG tablet   Oral   Take 100 mg by mouth at bedtime.         . verapamil (CALAN) 40 MG tablet   Oral   Take 40 mg by mouth 3 (three) times daily.           Allergies Haldol; Metformin; and Raspberry  Family History  Problem Relation Age of Onset  . Colon cancer Neg Hx   . Liver disease Neg Hx     Social History History  Substance Use Topics  . Smoking status: Former Research scientist (life sciences)  . Smokeless tobacco: Never Used  . Alcohol Use: No    Review of Systems Constitutional: Negative for fever. Eyes: Negative for visual changes. ENT: Negative for sore throat. Cardiovascular: Negative for chest pain. Respiratory: Negative for shortness of breath. Gastrointestinal: Negative for abdominal pain, vomiting and diarrhea. Genitourinary: Negative for dysuria. Musculoskeletal: Negative for back pain. Skin: Negative for rash. Neurological: Negative for headaches, focal weakness or numbness.   10-point ROS otherwise negative.  ____________________________________________   PHYSICAL EXAM:  VITAL SIGNS: ED Triage Vitals  Enc Vitals Group     BP 06/29/14 0557 195/91 mmHg     Pulse Rate 06/29/14 0557 96     Resp --      Temp 06/29/14 0557 98.6 F (37 C)     Temp Source 06/29/14 0557 Oral     SpO2 06/29/14 0557 99 %     Weight 06/29/14 0557 289 lb (131.09 kg)     Height 06/29/14 0557 5\' 7"  (1.702 m)     Head Cir --      Peak Flow --  Pain Score 06/29/14 0559 0     Pain Loc --      Pain Edu? --      Excl. in [place]? --      Constitutional: Sleepy but arousable in no obvious distress that she short of breath  but does not actually of breath. Eyes: Conjunctivae are normal. PERRL. Normal extraocular movements. ENT   Head: Normocephalic and atraumatic.   Nose: No congestion/rhinnorhea.   Mouth/Throat: Mucous membranes are moist.   Neck: No stridor. Hematological/Lymphatic/Immunilogical: No cervical lymphadenopathy. Cardiovascular: Normal rate, regular rhythm. Normal and symmetric distal pulses are present in all extremities. No murmurs, rubs, or gallops. Respiratory: Normal respiratory effort without tachypnea nor retractions. Breath sounds are clear and equal bilaterally. No wheezes/rales/rhonchi. Gastrointestinal: Soft and nontender. No distention. No abdominal bruits. There is no CVA tenderness. Musculoskeletal: Nontender with normal range of motion in all extremities. No joint effusions.  She did have 1+ edema in the legs Neurologic:  Normal speech and language. No gross focal neurologic deficits are appreciated. Speech is normal. No gait instability. Skin:  Skin is warm, dry and intact. No rash noted.   ____________________________________________    LABS (pertinent positives/negatives)  Labs reviewed especially blood gas see lab sheet for details  ____________________________________________   EKG EKG shows normal sinus rhythm at a rate of 93 normal axis very poor baseline really unable to evaluate the T waves however there is does not appear to be any obvious ST segment changes or T-wave inversions QRS is normal in duration and not appear to be any block  ____________________________________________    RADIOLOGY   X-ray reviewed ____________________________________________   PROCEDURES  Procedure(s) performed: None  Critical Care performed: No  ____________________________________________   INITIAL IMPRESSION / ASSESSMENT AND PLAN / ED COURSE  Pertinent labs & imaging results that were available during my care of the patient were reviewed by me and  considered in my medical decision making (see chart for details).  Blood gas shows acidosis and hypercarbia  ____________________________________________   FINAL CLINICAL IMPRESSION(S) / ED DIAGNOSES  Final diagnoses:  Acute respiratory failure, unspecified whether with hypoxia or hypercapnia    Nena Polio, MD 07/04/14 1611

## 2014-06-29 NOTE — Progress Notes (Signed)
Solana provided pastoral visit and concluded with prayer.  [name] [name] [phone]   06/29/14 1600  Clinical Encounter Type  Visited With Patient  Visit Type Spiritual support  Spiritual Encounters  Spiritual Needs Prayer  Stress Factors  Patient Stress Factors None identified  Family Stress Factors None identified

## 2014-06-29 NOTE — Progress Notes (Addendum)
Pt. admitted to unit. Oriented to room, call bell, Ascom phones and staff. Bed in low position. Fall safety plan reviewed. Full assessment to Epic. Will continue to monitor.   BP high upon admission, prn IV hydralazine given along with daily PO BP meds. Dr. Tressia Miners aware.   Upon review of medications, pt states that "they stopped all my psych meds." Medication list updated accordingly. Pt states she will have her son bring a complete list of her home medications. Med list shows as "incomplete" because patient could not produce a complete and thorough list of home medications.

## 2014-06-29 NOTE — H&P (Signed)
[facility] NAME: [name] [name]    MR#:  [id number]  DATE OF BIRTH:  1946/02/16  DATE OF ADMISSION:  06/29/2014  PRIMARY CARE PHYSICIAN: Dr. Brynda Greathouse   REQUESTING/REFERRING PHYSICIAN: Dr. Cinda Quest  CHIEF COMPLAINT:   Chief Complaint  Patient presents with  . Shortness of Breath    HISTORY OF PRESENT ILLNESS:  [name] [name]  is a 69 y.o. female with a known history of schizophrenia, insulin-dependent diabetes mellitus, hypertension, osteoarthritis and sleep apnea not using CPAP, diastolic congestive heart failure presents from home secondary to acute respiratory distress that started this morning. Family is not at bedside and patient is currently on BiPAP and unable to provide most of the history. Most of the history is obtained from the ER physician and also previous notes. Patient has had multiple prior admissions for COPD exacerbation and diastolic CHF exacerbation. Cardiology on 2 L home oxygen. Her last discharge was about a week ago from this hospital. He says she has been feeling fine up until yesterday, she hasn't been using her CPAP because her machine was broken she couldn't get her new CPAP without a sleep study. Suddenly felt that she couldn't breathe denies any chest pain no diaphoresis and was brought to the hospital by EMS. No fevers or chills. Chest x-ray is clear without any acute changes. ABG showing respiratory acidosis and elevated CO2. Patient is currently on BiPAP and is being admitted for acute respiratory distress secondary to COPD exacerbation.  PAST MEDICAL HISTORY:   Past Medical History  Diagnosis Date  . DM2 (diabetes mellitus, type 2)   . Hypertension   . Hypercholesteremia   . Schizophrenia   . Osteoarthritis   . Bursitis   . OSA (obstructive sleep apnea)     does not use machine  . Left ventricular outflow tract obstruction     a. echo 03/2014: EF 60-65%, hypernamic LV systolic fxn, mod LVH w/ LVOT  gradient estimated at 68 mm Hg w/ valsalva, very small LV internal cavity size, mildly increased LV posterior wall thickness, mild Ao valve scl w/o stenosis, diastolic dysfunction, normal RVSP  . LVH (left ventricular hypertrophy)     a. echo suggests long standing uncontrolled htn. she will not do well when dehydrated, LV cavity obliteration  . Obesity     PAST SURGICAL HISTORY:   Past Surgical History  Procedure Laterality Date  . Tonsillectomy    . Knee reconstruction, medial patellar femoral ligament    . Cholecystectomy    . Colonoscopy  04/2011    [facility] per patient incomplete    SOCIAL HISTORY:   History  Substance Use Topics  . Smoking status: Former Research scientist (life sciences)  . Smokeless tobacco: Never Used  . Alcohol Use: No    FAMILY HISTORY:   Family History  Problem Relation Age of Onset  . Colon cancer Neg Hx   . Liver disease Neg Hx    HTN and CAD in Mother  DRUG ALLERGIES:   Allergies  Allergen Reactions  . Haldol [Haloperidol Decanoate] Swelling and Other (See Comments)    Tongue swells and blurred vision  . Metformin Diarrhea  . Raspberry Swelling    Other reaction(s): SWELLINGin lips    REVIEW OF SYSTEMS:  Difficult to be obtained due to Patients confusion and being on Bipap  MEDICATIONS AT HOME:   Prior to Admission medications   Medication Sig Start Date End Date Taking? Authorizing Provider  acetaminophen (TYLENOL) 325 MG tablet  Take 650 mg by mouth every 4 (four) hours as needed.    Historical Provider, MD  aspirin EC 81 MG tablet Take 81 mg by mouth daily.      Historical Provider, MD  atorvastatin (LIPITOR) 20 MG tablet Take 20 mg by mouth daily.    Historical Provider, MD  benztropine (COGENTIN) 1 MG tablet Take 1 mg by mouth 2 (two) times daily.    Historical Provider, MD  fluPHENAZine (PROLIXIN) 5 MG tablet Take 10 mg by mouth at bedtime.     Historical Provider, MD  fluPHENAZine decanoate (PROLIXIN) 25 MG/ML injection Inject 50 mg into the muscle every  14 (fourteen) days. Unknown dose. Given at a [facility], states she will now have ACT team coming to her home to administer    Historical Provider, MD  glipiZIDE (GLUCOTROL) 10 MG tablet Take 10 mg by mouth 2 (two) times daily before a meal.    Historical Provider, MD  insulin glargine (LANTUS) 100 UNIT/ML injection Inject 15 Units into the skin at bedtime.    Historical Provider, MD  lisinopril (PRINIVIL,ZESTRIL) 5 MG tablet Take 5 mg by mouth daily.    Historical Provider, MD  LORazepam (ATIVAN) 0.5 MG tablet Take 0.5 mg by mouth every 8 (eight) hours.    Historical Provider, MD  olanzapine zydis (ZYPREXA) 15 MG disintegrating tablet Take 15 mg by mouth 2 (two) times daily.    Historical Provider, MD  oxybutynin (DITROPAN-XL) 10 MG 24 hr tablet Take 10 mg by mouth every morning.    Historical Provider, MD  predniSONE (DELTASONE) 10 MG tablet Take 10 mg by mouth daily with breakfast.    Historical Provider, MD  ranitidine (ZANTAC) 150 MG tablet Take 150 mg by mouth daily.      Historical Provider, MD  sitaGLIPtin (JANUVIA) 100 MG tablet Take 100 mg by mouth daily.    Historical Provider, MD  traZODone (DESYREL) 100 MG tablet Take 100 mg by mouth at bedtime.    Historical Provider, MD  verapamil (CALAN) 40 MG tablet Take 40 mg by mouth 3 (three) times daily.    Historical Provider, MD      VITAL SIGNS:  Blood pressure 136/92, pulse 98, temperature 98.6 F (37 C), temperature source Oral, resp. rate 20, height 5\' 7"  (1.702 m), weight 131.09 kg (289 lb), SpO2 100 %.  PHYSICAL EXAMINATION:  GENERAL:  69 y.o.-year-old patient lying in the bed on Bipap.  EYES: Pupils equal, round, reactive to light and accommodation. No scleral icterus. Extraocular muscles intact.  HEENT: Head atraumatic, normocephalic. Oropharynx and nasopharynx clear.  NECK:  Supple, no jugular venous distention. No thyroid enlargement, no tenderness.  LUNGS: Bilateral coarse breath sounds, scattered wheezes. No  crackles, No use of accessory muscles for breathing CARDIOVASCULAR: S1, S2 normal. No murmurs, rubs, or gallops.  ABDOMEN: Soft, nontender, nondistended. Bowel sounds present. No organomegaly or mass.  EXTREMITIES: 1+ pedal edema,No cyanosis, or clubbing.  NEUROLOGIC: Cranial nerves II through XII are intact. Muscle strength 5/5 in all extremities. Sensation intact. Gait not checked.  PSYCHIATRIC: The patient is sleepy, arousable and following simple commands. Requesting for Dr. Marcille Blanco SKIN: No obvious rash, lesion, or ulcer.   LABORATORY PANEL:   CBC  Recent Labs Lab 06/29/14 0552  WBC 11.3*  HGB 13.7  HCT 43.5  PLT 259   ------------------------------------------------------------------------------------------------------------------  Chemistries   Recent Labs Lab 06/29/14 0552  NA 140  K 3.7  CL 98*  CO2 33*  GLUCOSE 318*  BUN 16  CREATININE 0.89  CALCIUM 8.9  AST 22  ALT 23  ALKPHOS 71  BILITOT 0.3   ------------------------------------------------------------------------------------------------------------------  Cardiac Enzymes  Recent Labs Lab 06/29/14 0552  TROPONINI <0.03   ------------------------------------------------------------------------------------------------------------------  RADIOLOGY:  Dg Chest Portable 1 View  06/29/2014   CLINICAL DATA:  69 year old female with SOB and LE swelling  EXAM: PORTABLE CHEST - 1 VIEW  COMPARISON:  Multiple prior plain film 06/18/2014, 05/05/2014, 04/23/2014  FINDINGS: Cardiomediastinal silhouette unchanged.  Low lung volumes with accentuation in the interstitium.  Bases of the lungs not well evaluated.  No large confluent airspace disease.  No large pleural effusion or pneumothorax.  Degenerative changes of the shoulders.  IMPRESSION: Low lung volumes with interstitial opacities, potentially atelectasis or consolidation. Lung bases not well evaluated. If there is concern for further evaluation, a formal PA and  lateral chest x-ray would be useful  Signed,  Dulcy Fanny. Earleen Newport, DO  [facility]  [facility]   Electronically Signed   By: Corrie Mckusick D.O.   On: 06/29/2014 07:51    EKG:   Orders placed or performed in visit on 05/15/14  . EKG 12-Lead    IMPRESSION AND PLAN:   The patient is a 69 year old African-American female with past medical history significant for COPD on chronic on chronic 2 L home oxygen, diastolic congestive heart failure obstructive sleep apnea not using her CPAP, hypertension and insulin-dependent diabetes mellitus and schizophrenia presents from home secondary to acute respiratory distress. She was noted to be hypoxic and confused in the hospital.   #1 Altered mental status : secondary to CO2 narcosis, metabolic encephalopathy. Continue BiPAP at this time and repeat ABG later today.  #2 acute on chronic respiratory failure secondary to COPD exacerbation. Chest x-ray with interstitial densities but no acute consolidation. Recently treated with Levaquin antibiotic so we'll hold off on antibiotics at this time. Nebs and Solu-Medrol and continue her home inhalers. Continue BiPAP. We'll place on COPD goal protocol.  #3 insulin-dependent diabetes mellitus continue Lantus, Januvia and will place on sliding scale insulin especially because she is on steroids now  #4 hypertension-continue lisinopril and verapamil  #5 schizophrenia-recently seen by psychiatric is during her last admission. Continue her fluphenazine and olanzapine.  #6 congestive heart failure-known ejection fraction of 60% has thickened septum and hyperdynamic left ventricle. Lasix will be given as needed.  #7 DVT prophylaxis-with subcutaneous heparin  All the records are reviewed and case discussed with ED provider. Management plans discussed with the patient, family and they are in agreement.  CODE STATUS: Full code  TOTAL TIME TAKING CARE OF THIS PATIENT: 50  minutes.    Gladstone Lighter M.D on 06/29/2014 at 1:41 PM  Between 7am to 6pm - Pager - [phone]  After 6pm go to [email or web address] - password [email or web address]  Office  [phone]  CC: Primary care physician; PROVIDER NOT IN SYSTEM

## 2014-06-29 NOTE — ED Notes (Signed)
Pt's son at bedside. He explains that the pt was recently given a r/x for a CPAP at home, but cannot get the machine as ordered until she completes a sleep study (for which she is currently on a waiting list to be scheduled).

## 2014-06-29 NOTE — ED Notes (Signed)
Pt presets to ED with c/o shortness of breath and lower leg swelling. Per EMS, pt is reported to use CPAP but is non-compliant with its use and her medication regimen. Per EMS, pt was hypertensive upon arrival (186/126). Pt uses chronic O2 via [place] at home, EMS reports finding it off the pt and "kinked and twisted" when they arrived. Pt is currently A&O, in  NAD, speaking in complete sentences, asking for "Dr Marcille Blanco".

## 2014-06-30 LAB — BASIC METABOLIC PANEL
Anion gap: 10 (ref 5–15)
BUN: 23 mg/dL — AB (ref 6–20)
CHLORIDE: 100 mmol/L — AB (ref 101–111)
CO2: 30 mmol/L (ref 22–32)
Calcium: 9.3 mg/dL (ref 8.9–10.3)
Creatinine, Ser: 0.91 mg/dL (ref 0.44–1.00)
GFR calc Af Amer: >60 mL/min (ref >60)
GFR calc non Af Amer: >60 mL/min (ref >60)
Glucose, Bld: 392 mg/dL — ABNORMAL HIGH (ref 65–99)
Potassium: 4.6 mmol/L (ref 3.5–5.1)
Sodium: 140 mmol/L (ref 135–145)

## 2014-06-30 LAB — GLUCOSE, CAPILLARY
GLUCOSE-CAPILLARY: 374 mg/dL — AB (ref 70–99)
GLUCOSE-CAPILLARY: 453 mg/dL — AB (ref 70–99)
GLUCOSE-CAPILLARY: 479 mg/dL — AB (ref 70–99)
Glucose-Capillary: 426 mg/dL — ABNORMAL HIGH (ref 70–99)

## 2014-06-30 LAB — CBC
HCT: 44.7 % (ref 35.0–47.0)
Hemoglobin: 14.1 g/dL (ref 12.0–16.0)
MCH: 27.3 pg (ref 26.0–34.0)
MCHC: 31.5 g/dL — ABNORMAL LOW (ref 32.0–36.0)
MCV: 86.4 fL (ref 80.0–100.0)
Platelets: 288 10*3/uL (ref 150–440)
RBC: 5.17 MIL/uL (ref 3.80–5.20)
RDW: 14.8 % — AB (ref 11.5–14.5)
WBC: 14.5 10*3/uL — AB (ref 3.6–11.0)

## 2014-06-30 MED ORDER — TIOTROPIUM BROMIDE MONOHYDRATE 18 MCG IN CAPS
18.0000 ug | ORAL_CAPSULE | Freq: Every day | RESPIRATORY_TRACT | Status: DC
  Administered 2014-06-30 – 2014-07-03 (×4): 18 ug via RESPIRATORY_TRACT
  Filled 2014-06-30: qty 5

## 2014-06-30 MED ORDER — INSULIN ASPART 100 UNIT/ML ~~LOC~~ SOLN
0.0000 [IU] | Freq: Every day | SUBCUTANEOUS | Status: DC
  Administered 2014-06-30 – 2014-07-02 (×3): 5 [IU] via SUBCUTANEOUS
  Filled 2014-06-30 (×3): qty 5

## 2014-06-30 MED ORDER — INSULIN ASPART 100 UNIT/ML ~~LOC~~ SOLN: 0.0000 [IU] | Freq: Three times a day (TID) | SUBCUTANEOUS | Status: DC

## 2014-06-30 MED ORDER — METHYLPREDNISOLONE SODIUM SUCC 40 MG IJ SOLR
40.0000 mg | Freq: Two times a day (BID) | INTRAMUSCULAR | Status: DC
  Administered 2014-06-30 – 2014-07-01 (×2): 40 mg via INTRAVENOUS
  Filled 2014-06-30 (×2): qty 1

## 2014-06-30 MED ORDER — INSULIN ASPART 100 UNIT/ML ~~LOC~~ SOLN
0.0000 [IU] | Freq: Three times a day (TID) | SUBCUTANEOUS | Status: DC
  Administered 2014-06-30 – 2014-07-01 (×3): 15 [IU] via SUBCUTANEOUS
  Administered 2014-07-01: 11 [IU] via SUBCUTANEOUS
  Administered 2014-07-01: 15 [IU] via SUBCUTANEOUS
  Administered 2014-07-02: 11 [IU] via SUBCUTANEOUS
  Administered 2014-07-02: 8 [IU] via SUBCUTANEOUS
  Administered 2014-07-02: 5 [IU] via SUBCUTANEOUS
  Administered 2014-07-03: 8 [IU] via SUBCUTANEOUS
  Administered 2014-07-03: 3 [IU] via SUBCUTANEOUS
  Administered 2014-07-03: 5 [IU] via SUBCUTANEOUS
  Filled 2014-06-30: qty 11
  Filled 2014-06-30: qty 8
  Filled 2014-06-30: qty 5
  Filled 2014-06-30: qty 11
  Filled 2014-06-30: qty 15
  Filled 2014-06-30: qty 5
  Filled 2014-06-30: qty 3
  Filled 2014-06-30: qty 15
  Filled 2014-06-30: qty 8
  Filled 2014-06-30 (×2): qty 15

## 2014-06-30 MED ORDER — BUDESONIDE 0.5 MG/2ML IN SUSP
0.5000 mg | Freq: Two times a day (BID) | RESPIRATORY_TRACT | Status: DC
  Administered 2014-06-30 – 2014-07-03 (×7): 0.5 mg via RESPIRATORY_TRACT
  Filled 2014-06-30 (×6): qty 2

## 2014-06-30 MED ORDER — ALBUTEROL SULFATE (2.5 MG/3ML) 0.083% IN NEBU
2.5000 mg | INHALATION_SOLUTION | RESPIRATORY_TRACT | Status: DC
  Administered 2014-06-30 – 2014-07-03 (×20): 2.5 mg via RESPIRATORY_TRACT
  Filled 2014-06-30 (×20): qty 3

## 2014-06-30 MED ORDER — INSULIN GLARGINE 100 UNIT/ML ~~LOC~~ SOLN
25.0000 [IU] | Freq: Every day | SUBCUTANEOUS | Status: DC
  Administered 2014-06-30: 25 [IU] via SUBCUTANEOUS
  Filled 2014-06-30 (×2): qty 0.25

## 2014-06-30 MED ORDER — MOMETASONE FURO-FORMOTEROL FUM 200-5 MCG/ACT IN AERO
2.0000 | INHALATION_SPRAY | Freq: Two times a day (BID) | RESPIRATORY_TRACT | Status: DC
Start: 2014-06-30 — End: 2014-07-04
  Administered 2014-06-30 – 2014-07-03 (×6): 2 via RESPIRATORY_TRACT
  Filled 2014-06-30: qty 8.8

## 2014-06-30 NOTE — Consult Note (Signed)
Date: 06/30/2014,   MRN# [id number] [name] [name] September 15, 1945 Code Status:     Code Status Orders        Start     Ordered   06/29/14 1524  Full code   Continuous     06/29/14 1523           AdmissionWeight: 289 lb (131.09 kg)                 CurrentWeight: 273 lb 4.8 oz (123.968 kg)      CHIEF COMPLAINT:  SOB and increased WOB    HISTORY OF PRESENT ILLNESS   69 y.o. female with a known history of schizophrenia, insulin-dependent diabetes mellitus, hypertension, osteoarthritis and sleep apnea not using CPAP, diastolic congestive heart failure presents from home secondary to acute respiratory distress that started this morning. Family is not at bedside and patient is currently on [place], used BiPAP last night. Patient states that she feels a little better but still SOB, she does NOT seem to be in distress. Denies F/C/NVD at this time.  Patient has had multiple prior admissions for COPD exacerbation and diastolic CHF exacerbation. onn 2 L home oxygen. Her last discharge was about a week ago from this hospital. she hasn't been using her CPAP because her machine was broken she couldn't get her new CPAP without a sleep study. Chest x-ray is clear without any acute changes. ABG showing respiratory acidosis and elevated CO2.  Patient being treated with BD therapy and steroids  PAST MEDICAL HISTORY   Past Medical History  Diagnosis Date  . DM2 (diabetes mellitus, type 2)   . Hypertension   . Hypercholesteremia   . Schizophrenia   . Osteoarthritis   . Bursitis   . OSA (obstructive sleep apnea)     does not use machine  . Left ventricular outflow tract obstruction     a. echo 03/2014: EF 60-65%, hypernamic LV systolic fxn, mod LVH w/ LVOT gradient estimated at 68 mm Hg w/ valsalva, very small LV internal cavity size, mildly increased LV posterior wall thickness, mild Ao valve scl w/o stenosis, diastolic dysfunction, normal RVSP  . LVH (left ventricular hypertrophy)     a. echo  suggests long standing uncontrolled htn. she will not do well when dehydrated, LV cavity obliteration  . Obesity   . CHF (congestive heart failure)   . Anxiety      SURGICAL HISTORY   Past Surgical History  Procedure Laterality Date  . Tonsillectomy    . Knee reconstruction, medial patellar femoral ligament    . Cholecystectomy    . Colonoscopy  04/2011    [facility] per patient incomplete     FAMILY HISTORY   Family History  Problem Relation Age of Onset  . Colon cancer Neg Hx   . Liver disease Neg Hx      SOCIAL HISTORY   History  Substance Use Topics  . Smoking status: Former Research scientist (life sciences)  . Smokeless tobacco: Never Used  . Alcohol Use: No     MEDICATIONS    Home Medication:  No current outpatient prescriptions on file.  Current Medication: @CURMEDTAB @     ALLERGIES   Haldol; Metformin; and Raspberry     REVIEW OF SYSTEMS   Review of Systems  Constitutional: Positive for malaise/fatigue. Negative for fever, chills, weight loss and diaphoresis.  HENT: Negative for congestion and hearing loss.   Eyes: Negative for blurred vision and double vision.  Respiratory: Positive for shortness of breath and wheezing. Negative  for cough and sputum production.   Cardiovascular: Negative for chest pain, palpitations and orthopnea.  Gastrointestinal: Negative for heartburn, nausea, vomiting, abdominal pain, diarrhea, constipation and blood in stool.  Genitourinary: Negative for dysuria and urgency.  Musculoskeletal: Negative for myalgias, back pain and neck pain.  Skin: Negative for rash.  Neurological: Negative for dizziness, tingling, tremors, weakness and headaches.  Endo/Heme/Allergies: Does not bruise/bleed easily.  Psychiatric/Behavioral: Negative for depression, suicidal ideas and substance abuse.  All other systems reviewed and are negative.    VS: BP 143/79 mmHg  Pulse 87  Temp(Src) 98.3 F (36.8 C) (Oral)  Resp 24  Ht 5\' 7"  (1.702 m)  Wt 273 lb 4.8 oz  (123.968 kg)  BMI 42.79 kg/m2  SpO2 94%     PHYSICAL EXAM   Physical Exam  Constitutional: She is oriented to person, place, and time and well-developed, well-nourished, and in no distress. No distress.  HENT:  Head: Normocephalic and atraumatic.  Mouth/Throat: Oropharynx is clear and moist.  Eyes: EOM are normal. Pupils are equal, round, and reactive to light.  Neck: Normal range of motion. Neck supple.  Cardiovascular: Normal rate and regular rhythm.   No murmur heard. Pulmonary/Chest: Effort normal. No respiratory distress. She has no wheezes. She has rales.  Abdominal: Soft. Bowel sounds are normal. She exhibits no distension. There is no tenderness. There is no rebound.  Musculoskeletal: Normal range of motion. She exhibits edema.  Neurological: She is alert and oriented to person, place, and time. She displays normal reflexes. No cranial nerve deficit. GCS score is 15.  Skin: Skin is warm and dry. She is not diaphoretic.  Psychiatric: Affect normal.       LABS    Recent Labs     06/29/14  0552  06/29/14  1651  06/29/14  1836  06/30/14  0504  HGB  13.7   --   14.8  14.1  HCT  43.5   --   45.9  44.7  MCV  86.9   --   87.1  86.4  WBC  11.3*   --   13.3*  14.5*  BUN  16   --    --   23*  CREATININE  0.89  0.81   --   0.91  GLUCOSE  318*   --    --   392*  CALCIUM  8.9   --    --   9.3  ,    No results for input(s): PH in the last 72 hours.  Invalid input(s): PCO2, PO2, BASEEXCESS, BASEDEFICITE, TFT    CULTURE RESULTS   No results found for this or any previous visit (from the past 240 hour(s)).        IMAGING    Dg Chest Portable 1 View  06/29/2014   CLINICAL DATA:  69 year old female with SOB and LE swelling  EXAM: PORTABLE CHEST - 1 VIEW  COMPARISON:  Multiple prior plain film 06/18/2014, 05/05/2014, 04/23/2014  FINDINGS: Cardiomediastinal silhouette unchanged.  Low lung volumes with accentuation in the interstitium.  Bases of the lungs not well  evaluated.  No large confluent airspace disease.  No large pleural effusion or pneumothorax.  Degenerative changes of the shoulders.  IMPRESSION: Low lung volumes with interstitial opacities, potentially atelectasis or consolidation. Lung bases not well evaluated. If there is concern for further evaluation, a formal PA and lateral chest x-ray would be useful  Signed,  Dulcy Fanny. Earleen Newport DO  [facility]  [facility]   Electronically Signed   By: Corrie Mckusick D.O.   On: 06/29/2014 07:51         ASSESSMENT/PLAN   69 yo morbidly obese AAF admitted for acute on Chronic SOB and resp distress with acute Diastolic heart failure with COPD exacerbation with underlying OSA/OHS  1.continue oxygen as needed 2.BiPAP as tolerated when asleep 3.start SPiriva 4.start dulera 5.recommend lasix as tolerated 6.albuterol nebs and pulmicort nebs  Continue Supportive care and management   I have personally obtained a history, examined the patient, evaluated laboratory and imaging results, formulated the assessment and plan and placed orders.  The Patient requires high complexity decision making for assessment and support, frequent evaluation and titration of therapies, application of advanced monitoring technologies and extensive interpretation of multiple databases.     Corrin Parker, M.D. [facility] [place] Director Intensive Care Unit

## 2014-06-30 NOTE — Plan of Care (Signed)
Problem: Consults Goal: COPD Patient Education (See Patient Education Module for education specifics.)  Outcome: Not Progressing Pt unable to understand teaching due to mental illness.

## 2014-06-30 NOTE — Progress Notes (Signed)
[place] [facility] [place] NAME: [name] [name]    MR#:  [id number]  DATE OF BIRTH:  [birth year]  SUBJECTIVE:  CHIEF COMPLAINT:   Chief Complaint  Patient presents with  . Shortness of Breath   Today feels better and is awake and shortness of breath improved and off Bipap.   REVIEW OF SYSTEMS:    Review of Systems  Constitutional: Negative for fever and chills.  HENT: Negative for congestion and tinnitus.   Respiratory: Positive for shortness of breath. Negative for cough.   Cardiovascular: Negative for chest pain, orthopnea and PND.  Gastrointestinal: Negative for nausea, vomiting, abdominal pain and diarrhea.  Genitourinary: Negative for dysuria and hematuria.  Neurological: Positive for weakness (Generalized). Negative for tremors and focal weakness.  All other systems reviewed and are negative.   Nutrition:  Heart healthy diet Tolerating Diet: Yes Tolerating PT: Await Eval.      DRUG ALLERGIES:   Allergies  Allergen Reactions  . Haldol [Haloperidol Decanoate] Swelling and Other (See Comments)    Tongue swells and blurred vision  . Metformin Diarrhea  . Raspberry Swelling    Other reaction(s): SWELLINGin lips    VITALS:  Blood pressure 143/78, pulse 93, temperature 98.5 F (36.9 C), temperature source Oral, resp. rate 18, height 5\' 7"  (1.702 m), weight 123.968 kg (273 lb 4.8 oz), SpO2 100 %.  PHYSICAL EXAMINATION:   Physical Exam  GENERAL:  69 y.o.-year-old obese patient sitting up in chair in NAD.  EYES: Pupils equal, round, reactive to light and accommodation. No scleral icterus. Extraocular muscles intact.  HEENT: Head atraumatic, normocephalic. Oropharynx and nasopharynx clear.  NECK:  Supple, no jugular venous distention. No thyroid enlargement, no tenderness.  LUNGS: Prolonged Insp & exp. Phase.  Minimal end-exp. Wheezing.   CARDIOVASCULAR: S1, S2 normal. No murmurs, rubs, or gallops.  ABDOMEN: Soft,  nontender, nondistended. Bowel sounds present. No organomegaly or mass.  EXTREMITIES: No cyanosis, clubbing or edema b/l.    NEUROLOGIC: Cranial nerves II through XII are intact. No focal Motor or sensory deficits b/l.   PSYCHIATRIC: The patient is alert and oriented x 3. Anxious  SKIN: No obvious rash, lesion, or ulcer.    LABORATORY PANEL:   CBC  Recent Labs Lab 06/30/14 0504  WBC 14.5*  HGB 14.1  HCT 44.7  PLT 288   ------------------------------------------------------------------------------------------------------------------  Chemistries   Recent Labs Lab 06/29/14 0552  06/30/14 0504  NA 140  --  140  K 3.7  --  4.6  CL 98*  --  100*  CO2 33*  --  30  GLUCOSE 318*  --  392*  BUN 16  --  23*  CREATININE 0.89  < > 0.91  CALCIUM 8.9  --  9.3  AST 22  --   --   ALT 23  --   --   ALKPHOS 71  --   --   BILITOT 0.3  --   --   < > = values in this interval not displayed. ------------------------------------------------------------------------------------------------------------------  Cardiac Enzymes  Recent Labs Lab 06/29/14 0552  TROPONINI <0.03   ------------------------------------------------------------------------------------------------------------------  RADIOLOGY:  Dg Chest Portable 1 View  06/29/2014   CLINICAL DATA:  69 year old female with SOB and LE swelling  EXAM: PORTABLE CHEST - 1 VIEW  COMPARISON:  Multiple prior plain film 06/18/2014, 05/05/2014, 04/23/2014  FINDINGS: Cardiomediastinal silhouette unchanged.  Low lung volumes with accentuation in the interstitium.  Bases of the lungs not well  evaluated.  No large confluent airspace disease.  No large pleural effusion or pneumothorax.  Degenerative changes of the shoulders.  IMPRESSION: Low lung volumes with interstitial opacities, potentially atelectasis or consolidation. Lung bases not well evaluated. If there is concern for further evaluation, a formal PA and lateral chest x-ray would be useful   Signed,  Dulcy Fanny. Earleen Newport, DO  [facility]  [facility]   Electronically Signed   By: Corrie Mckusick D.O.   On: 06/29/2014 07:51     ASSESSMENT AND PLAN:   The patient is a 69 year old African-American female with past medical history significant for COPD on chronic on chronic 2 L home oxygen, diastolic congestive heart failure obstructive sleep apnea not using her CPAP, hypertension and insulin-dependent diabetes mellitus and schizophrenia presents from home secondary to acute respiratory distress. She was noted to be hypoxic and confused in the hospital.   * Altered mental status : secondary to Hypercarbic Resp. failure, metabolic encephalopathy.  - improved w/ bipap and mental status back to baseline.  Hypercarbia improved.   * acute on chronic respiratory failure secondary to COPD exacerbation. - cont. IV steroids, ATC nebs. Cont. Dulera, Spiriva. Cont. Pulmicort.   - PRN bipap.  Appreciate Pulm. Consult and cont. Current care.  - already on o2 at home.   * insulin-dependent diabetes mellitus - BS are a bit high due to IV steroids.  - cont. SSI, Lantus and follow BS  * hypertension - bp stable.  - continue lisinopril and verapamil  * schizophrenia - recently seen by psychiatric is during her last admission. Continue her fluphenazine and olanzapine.  * congestive heart failure - hx of chronic diastolic CHF.   - clinically not in CHF.  Lasix on PRN basis.   Await PT and Social work input prior to discharge.   All the records are reviewed and case discussed with Care Management/Social Workerr. Management plans discussed with the patient, family and they are in agreement.  CODE STATUS: Full   DVT Prophylaxis: Heparin SQ  TOTAL TIME TAKING CARE OF THIS PATIENT: 30 minutes.   POSSIBLE D/C IN 1-2 DAYS, DEPENDING ON CLINICAL CONDITION.   [name] M.D on 06/30/2014 at 11:39 AM  Between 7am to 6pm - Pager - [phone]  After  6pm go to [email or web address] - password [email or web address] [facility]  Office  [phone]  CC: Primary care physician; PROVIDER NOT IN SYSTEM

## 2014-06-30 NOTE — Progress Notes (Signed)
Notified dr. Verdell Carmine of blood sugar result of 453. md on floor stated will change sliding scale orders [employer]

## 2014-06-30 NOTE — Evaluation (Signed)
Physical Therapy Evaluation Patient Details Name: [name] [name] MRN: [id number] DOB: Jan 22, 1946 Today's Date: 06/30/2014   History of Present Illness  respiratory distress, pt discharged last week  Clinical Impression  Pt is a 69 y/o female who has been admitted multiple times recently.  Here for respiratory distress, she reports feeling unsure about her home situation/help from family - she does not voice a safety concerns but is not sure she wants to do home.  Regarding PT she does well, easily walking around the nurses' station and having no issues with bed mobility transfers.  She has no losses of balance and generally is safe and confident with PT acts despite reporting being slower than her baseline.     Follow Up Recommendations Home health PT    Equipment Recommendations       Recommendations for Other Services  ( care management - pt showing concern about living situation)     Precautions / Restrictions Precautions Precautions: Fall Restrictions Weight Bearing Restrictions: No      Mobility  Bed Mobility Overal bed mobility: Modified Independent                Transfers Overall transfer level: Modified independent Equipment used: Rolling walker (2 wheeled)                Ambulation/Gait Ambulation/Gait assistance: Modified independent (Device/Increase time) Ambulation Distance (Feet): 200 Feet Assistive device: Rolling walker (2 wheeled)       General Gait Details: pt walks without excessive fatigue and no loses of balance, she does report she is not as fast or confident as normal  Financial [employer] Rankin (Stroke Patients Only)       Balance Overall balance assessment: Modified Independent                                           Pertinent Vitals/Pain Pain Assessment: No/denies pain    Home Living Family/patient expects to be discharged to:: Private residence Living  Arrangements: Alone Available Help at Discharge:  (family normally helps, pt indicating "they don't like me")           Home Equipment: Walker - 4 wheels      Prior Function Level of Independence: Independent with assistive device(s)         Comments: Pt relies on family for grocery shopping, etc     Hand Dominance        Extremity/Trunk Assessment   Upper Extremity Assessment: Overall WFL for tasks assessed;Generalized weakness           Lower Extremity Assessment: Overall WFL for tasks assessed;Generalized weakness         Communication   Communication:  (slurred, lethargic speech)  Cognition Arousal/Alertness: Lethargic                          General Comments      Exercises        Assessment/Plan    PT Assessment Patient needs continued PT services  PT Diagnosis Generalized weakness   PT Problem List Decreased strength;Decreased balance;Decreased mobility;Decreased safety awareness  PT Treatment Interventions Gait training;Balance training;Patient/family education   PT Goals (Current goals can be found in the Care Plan section) Acute Rehab PT Goals Patient Stated Goal: Pt  stating she isn't sure about going home (family issues) PT Goal Formulation: With patient Time For Goal Achievement: 07/14/14 Potential to Achieve Goals: Good    Frequency Min 2X/week   Barriers to discharge        Co-evaluation               End of Session Equipment Utilized During Treatment: Gait belt Activity Tolerance: Patient tolerated treatment well Patient left: with bed alarm set           Time: 0100-7121 PT Time Calculation (min) (ACUTE ONLY): 22 min   Charges:   PT Evaluation $Initial PT Evaluation Tier I: 1 Procedure     PT G Codes:       Wayne Both, PT, DPT [phone]  Kreg Shropshire 06/30/2014, 12:46 PM

## 2014-06-30 NOTE — Progress Notes (Signed)
Pt A&Ox4, but forgetful and anxious.  Pt repeatedly calling out.  Pt stating that "family wants to kill me" and "family wants to just lock me up."  Pt states that MD took her off of psych medication.  Pt very troubled and agitated at beginning of shift.  Pt took night medications per [facility] and settled down with bipap in place.  Pt rested well after HS medications.  Pt short of breath when having anxiety and agitation, but once bipap in place, pt rested comfortably with no dyspnea at rest.  No complaints of pain.   Lynnda Shields, RN

## 2014-06-30 NOTE — Progress Notes (Signed)
Spoke with dr. Verdell Carmine to make aware blood sugar of 479. Made md aware patient to get 15units per sliding scale. md stated to give only sliding scale ordered nothing else to be ordered per md [employer]

## 2014-07-01 LAB — GLUCOSE, CAPILLARY
GLUCOSE-CAPILLARY: 305 mg/dL — AB (ref 70–99)
GLUCOSE-CAPILLARY: 450 mg/dL — AB (ref 70–99)
Glucose-Capillary: 378 mg/dL — ABNORMAL HIGH (ref 70–99)
Glucose-Capillary: 358 mg/dL — ABNORMAL HIGH (ref 70–99)

## 2014-07-01 LAB — HEMOGLOBIN A1C: Hgb A1c MFr Bld: 10.4 % — ABNORMAL HIGH (ref 4.0–6.0)

## 2014-07-01 MED ORDER — GLIPIZIDE 10 MG PO TABS
10.0000 mg | ORAL_TABLET | Freq: Two times a day (BID) | ORAL | Status: DC
  Administered 2014-07-01 – 2014-07-03 (×5): 10 mg via ORAL
  Filled 2014-07-01 (×5): qty 1

## 2014-07-01 MED ORDER — INSULIN GLARGINE 100 UNIT/ML ~~LOC~~ SOLN
30.0000 [IU] | Freq: Every day | SUBCUTANEOUS | Status: DC
  Administered 2014-07-01 – 2014-07-02 (×2): 30 [IU] via SUBCUTANEOUS
  Filled 2014-07-01 (×3): qty 0.3

## 2014-07-01 MED ORDER — METHYLPREDNISOLONE SODIUM SUCC 40 MG IJ SOLR
40.0000 mg | Freq: Every day | INTRAMUSCULAR | Status: DC
  Administered 2014-07-02: 40 mg via INTRAVENOUS
  Filled 2014-07-01: qty 1

## 2014-07-01 MED ORDER — INSULIN ASPART 100 UNIT/ML ~~LOC~~ SOLN
5.0000 [IU] | Freq: Three times a day (TID) | SUBCUTANEOUS | Status: DC
  Administered 2014-07-01 – 2014-07-03 (×8): 5 [IU] via SUBCUTANEOUS
  Filled 2014-07-01 (×8): qty 5

## 2014-07-01 NOTE — Progress Notes (Signed)
Patient readmission x 5 since march 2016.  Spoke with [facility] when staff are making their home visits, patient is not wearing her 02 and has not taken her medications correctly.  Patient compliance concerns also affects control of her schizophrenia sx.  [name] [name] works as a Pharmacist, hospital and he checks in on patient daily but is not able to be in patient's home 24/7- which is needed.  Says that patient does not qualify for medicaid with her 1200 dollar a month income.  Spoke with member of ACT team and no suggestions as to whether mental health agency can provide additional in home services.  [facility] has been involved in the past.  ACT says they have documentation that [name] is patient's legal guardian.  Have searched [facility] records and have not found that such a document has been scanned into the record.  Physical therapy has evaluated and recommends home health physical therapy.  Patient has medicare humana a skilled stay would not be approved with this recommendation.  Patient did have a skilled stay during march and patient was readmitted with a week of discharge home from the skilled nursing.  Discussed readmissions with patient's son.  He says that patient is being readmitted because she needs a cpap.  Without this "her lungs will never get stronger."  Discussed with [name] that even though patient may need a cpap  (hers had been broken for years) , this is not the reason for readmission.  REadmission per home health staff due to patient's compliance issues.   Discussed that care manager will  discuss obtaining a referral with attending.  Spoke with attending and did obtain a signature on referral and faxed it to [facility].    Discussed with attending to order an appointment made with a pulmonologist so patient can get established and can follow through on obtaining cpap if this is ordered.  Patient had an outpatient sleep study 2003.   He is thinking about retiring after this school year so he can spend more  time / support patient .  Discussed that patient is not able to manage all aspects of medication/medical regimen on her own.  Patient has verbalized to staff that she does not think her son loves her. Referral to clinical  social work .

## 2014-07-01 NOTE — Progress Notes (Signed)
Faxed referral to [facility] and obtained appointment with pulmonologist to facilitate cpap.

## 2014-07-01 NOTE — Progress Notes (Signed)
Physical Therapy Treatment Patient Details Name: [name] [name] MRN: [id number] DOB: Oct 08, 1945 Today's Date: 07/01/2014    History of Present Illness 69 yo female with onset of respiratory distress, has elevated BS today    PT Comments    Pt was seen for evaluation of her current tolerance for activity and to do strengthening, to walk.  Her LE movement is good but feeling stiff before ROM to legs.  Her plan is to go home but recommend help 24/7 as she is having some issues with controlling BS and will need supervision.  Focus on standing and gait tomorrow to increase standing safety and evaluate if BS is high again.  Follow Up Recommendations  Home health PT;Supervision/Assistance - 24 hour     Equipment Recommendations  None recommended by PT    Recommendations for Other Services       Precautions / Restrictions Precautions Precautions: Fall Restrictions Weight Bearing Restrictions: No    Mobility  Bed Mobility Overal bed mobility: Modified Independent             General bed mobility comments: reminders for hand placement to scoot   Transfers Overall transfer level: Needs assistance Equipment used: Rolling walker (2 wheeled);1 person hand held assist Transfers: Sit to/from [facility] Sit to Stand: Min guard Stand pivot transfers: Min guard       General transfer comment: BS was elevated to 392 earlier then nearly 500 as nursing gave pt insulin to get treatment  Ambulation/Gait Ambulation/Gait assistance: Min guard Ambulation Distance (Feet): 20 Feet Assistive device: Rolling walker (2 wheeled);1 person hand held assist Gait Pattern/deviations: Step-through pattern;Decreased step length - right;Decreased step length - left;Decreased stride length;Trunk flexed;Wide base of support Gait velocity: reduced Gait velocity interpretation: Below normal speed for age/gender General Gait Details: Pt was not feeling well enough to push very far as her  BS had been extraordinarily high   Stairs            Wheelchair Mobility    Modified Rankin (Stroke Patients Only)       Balance Overall balance assessment: Needs assistance Sitting-balance support: Feet supported Sitting balance-Leahy Scale: Good   Postural control: Posterior lean Standing balance support: Bilateral upper extremity supported Standing balance-Leahy Scale: Poor Standing balance comment: Pt is somewhat unsteady but BS was very high and did need close monitoring.  O2 sats 95-96% for pre and post gait, pulse 101 to 106 pre and post gait                    Cognition Arousal/Alertness: Lethargic Behavior During Therapy: WFL for tasks assessed/performed Overall Cognitive Status: Within Functional Limits for tasks assessed                      Exercises General Exercises - Lower Extremity Ankle Circles/Pumps: AROM;Both;10 reps Long Arc Quad: Strengthening;Both;10 reps Heel Slides: Strengthening;Both;10 reps Hip ABduction/ADduction: Strengthening;Both;10 reps Hip Flexion/Marching: AROM;Both;10 reps    General Comments General comments (skin integrity, edema, etc.): Pt is getting up bedside with out regard for chair and bed alarms, not making the impression with her that they mean to not get up alone.      Pertinent Vitals/Pain Pain Assessment: No/denies pain    Home Living                      Prior Function            PT Goals (current goals can  now be found in the care plan section) Acute Rehab PT Goals Patient Stated Goal: hoping to go home Progress towards PT goals: Progressing toward goals    Frequency  Min 2X/week    PT Plan Current plan remains appropriate    Co-evaluation             End of Session Equipment Utilized During Treatment: Oxygen Activity Tolerance: Patient tolerated treatment well Patient left: in chair;with call bell/phone within reach;with chair alarm set     Time: 1110-1138 PT Time  Calculation (min) (ACUTE ONLY): 28 min  Charges:  $Gait Training: 8-22 mins $Therapeutic Exercise: 8-22 mins                    G Codes:      Ramond Dial Jul 24, 2014, 11:59 AM   Mee Hives, PT MS Acute Rehab Dept. Number: [phone]

## 2014-07-01 NOTE — Progress Notes (Signed)
[place] [facility] [place] NAME: [name] [name]    MR#:  [id number]  DATE OF BIRTH:  04-02-1945  SUBJECTIVE:  CHIEF COMPLAINT:   Chief Complaint  Patient presents with  . Shortness of Breath   Today feels better and is awake and shortness of breath improved and off Bipap.   REVIEW OF SYSTEMS:    Review of Systems  Constitutional: Negative for fever and chills.  HENT: Negative for congestion and tinnitus.   Respiratory: Positive for shortness of breath (improved). Negative for cough.   Cardiovascular: Positive for leg swelling. Negative for chest pain, orthopnea and PND.  Gastrointestinal: Negative for nausea, vomiting, abdominal pain and diarrhea.  Genitourinary: Negative for dysuria and hematuria.  Neurological: Negative for tingling, sensory change, focal weakness and weakness.  All other systems reviewed and are negative.   Nutrition:  Diabetic Heart healthy diet Tolerating Diet: Yes Tolerating PT:  Yes      DRUG ALLERGIES:   Allergies  Allergen Reactions  . Haldol [Haloperidol Decanoate] Swelling and Other (See Comments)    Tongue swells and blurred vision  . Metformin Diarrhea  . Raspberry Swelling    Other reaction(s): SWELLINGin lips    VITALS:  Blood pressure 159/82, pulse 85, temperature 97.8 F (36.6 C), temperature source Oral, resp. rate 22, height 5\' 7"  (1.702 m), weight 123.968 kg (273 lb 4.8 oz), SpO2 98 %.  PHYSICAL EXAMINATION:   Physical Exam  GENERAL:  69 y.o.-year-old obese patient sitting up in chair in NAD.  EYES: Pupils equal, round, reactive to light and accommodation. No scleral icterus. Extraocular muscles intact.  HEENT: Head atraumatic, normocephalic. Oropharynx and nasopharynx clear.  NECK:  Supple, no jugular venous distention. No thyroid enlargement, no tenderness.  LUNGS: Prolonged Insp & exp. Phase.  Minimal end-exp. Wheezing. (-) use of accessory muscles.  No rhonci/wheezing.   CARDIOVASCULAR: S1, S2 normal. No murmurs, rubs, or gallops.  ABDOMEN: Soft, nontender, nondistended. Bowel sounds present. No organomegaly or mass.  EXTREMITIES: No cyanosis, clubbing.  + 1 edema b/l.  NEUROLOGIC: Cranial nerves II through XII are intact. No focal Motor or sensory deficits b/l.  Globally weak PSYCHIATRIC: The patient is alert and oriented x 3. Anxious  SKIN: No obvious rash, lesion, or ulcer.    LABORATORY PANEL:   CBC  Recent Labs Lab 06/30/14 0504  WBC 14.5*  HGB 14.1  HCT 44.7  PLT 288   ------------------------------------------------------------------------------------------------------------------  Chemistries   Recent Labs Lab 06/29/14 0552  06/30/14 0504  NA 140  --  140  K 3.7  --  4.6  CL 98*  --  100*  CO2 33*  --  30  GLUCOSE 318*  --  392*  BUN 16  --  23*  CREATININE 0.89  < > 0.91  CALCIUM 8.9  --  9.3  AST 22  --   --   ALT 23  --   --   ALKPHOS 71  --   --   BILITOT 0.3  --   --   < > = values in this interval not displayed. ------------------------------------------------------------------------------------------------------------------  Cardiac Enzymes  Recent Labs Lab 06/29/14 0552  TROPONINI <0.03   ------------------------------------------------------------------------------------------------------------------  RADIOLOGY:  No results found.   ASSESSMENT AND PLAN:   The patient is a 69 year old African-American female with past medical history significant for COPD on chronic on chronic 2 L home oxygen, diastolic congestive heart failure obstructive sleep apnea not using her CPAP, hypertension and insulin-dependent  diabetes mellitus and schizophrenia presents from home secondary to acute respiratory distress. She was noted to be hypoxic and confused in the hospital.   * Altered mental status : secondary to Hypercarbic Resp. failure, metabolic encephalopathy.  - improved w/ bipap and mental status back to baseline.   Hypercarbia improved.   * acute on chronic respiratory failure secondary to COPD exacerbation - improved. - cont. IV steroids but will taper, cont. ATC nebs. Cont. Dulera, Spiriva. Cont. Pulmicort.   - PRN bipap.  Appreciate Pulm. Consult and cont. Current care.  - already on o2 at home.   * insulin-dependent diabetes mellitus - BS still quite elevated and possibly related to steroids.  - will taper steroids.  - increase Lantus, add novolog w/ meals.  Add glipizide and follow BS  * hypertension - bp stable.  - continue lisinopril and verapamil  * schizophrenia - recently seen by psychiatric is during her last admission. Continue her fluphenazine and olanzapine.  * congestive heart failure - hx of chronic diastolic CHF.   - clinically not in CHF.  Lasix on PRN basis.   * Suspected OSA - needs an official sleep study as outpatient to get CPAP at home.  - will refer to pulm. As outpatient upon discharge (Dr. Humphrey Rolls)  Seen by PT and will d/c home w/ home health servies.   All the records are reviewed and case discussed with Care Management/Social Workerr. Management plans discussed with the patient, family and they are in agreement.  CODE STATUS: Full   DVT Prophylaxis: Heparin SQ  TOTAL TIME TAKING CARE OF THIS PATIENT: 30 minutes.   POSSIBLE D/C tomorrow a.m. If doing well and BS improved.    [name] M.D on 07/01/2014 at 12:27 PM  Between 7am to 6pm - Pager - [phone]  After 6pm go to [email or web address] - password [email or web address] [facility]  Office  [phone]  CC: Primary care physician; PROVIDER NOT IN SYSTEM

## 2014-07-01 NOTE — Progress Notes (Signed)
Initial Nutrition Assessment  INTERVENTION:  Meals and Snacks: Cater to patient preferences within current diet order   NUTRITION DIAGNOSIS:  No nutrition diagnosis at this time  GOAL:  Goal for pt to consume at least 90% estimated needs, currently met   MONITOR:  Energy Intake Pulmonary Profile Glucose Profile  REASON FOR ASSESSMENT:  Consult Assessment of nutrition requirement/status  ASSESSMENT:  Pt admitted with acute respiratory failure secondary to COPD exacerbation and AMS. Pt asleep on visit, unable to arouse. PMHx: COPD, CHF, GERD, HTN, DM, schizophrenia, LVH, obesity  PO Intake: recorded po intake 100% of meals times 4 since admission  Medications: Novolog, Lantus, Solumedrol  Labs: Electrolyte and Renal Profile:    Recent Labs Lab 06/29/14 0552 06/29/14 1651 06/30/14 0504  BUN 16  --  23*  CREATININE 0.89 0.81 0.91  NA 140  --  140  K 3.7  --  4.6   . Glucose Profile:  Recent Labs  06/30/14 2034 07/01/14 0743 07/01/14 1107  GLUCAP 426* 305* 450*   Protein Profile:  Recent Labs Lab 06/29/14 0552  ALBUMIN 3.7   Height:  Ht Readings from Last 1 Encounters:  06/29/14 '5\' 7"'  (1.702 m)   Weight:  Wt Readings from Last 1 Encounters:  06/30/14 273 lb 4.8 oz (123.968 kg)    Ideal Body Weight:     Wt Readings from Last 10 Encounters:  06/30/14 273 lb 4.8 oz (123.968 kg)  06/21/14 279 lb 15.8 oz (127 kg)  05/15/14 289 lb (131.09 kg)  10/03/13 285 lb (129.275 kg)  06/24/11 274 lb 4 oz (124.4 kg)  11/18/10 271 lb (122.925 kg)    BMI:  Body mass index is 42.79 kg/(m^2).  Diet Order:  Diet heart healthy/carb modified Room service appropriate?: Yes; Fluid consistency:: Thin   Intake/Output Summary (Last 24 hours) at 07/01/14 1131 Last data filed at 07/01/14 1118  Gross per 24 hour  Intake    600 ml  Output   2750 ml  Net  -2150 ml    Last BM:  5/7  LOW Care Level  Dwyane Luo, RD, LDN Pager [phone]
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
## 2014-07-01 NOTE — Progress Notes (Signed)
Dr. Verdell Carmine on the floor. Made aware patients current blood sugar in 450. md acknowledged [employer]

## 2014-07-02 LAB — CBC
HCT: 40.3 % (ref 35.0–47.0)
Hemoglobin: 12.6 g/dL (ref 12.0–16.0)
MCH: 27.3 pg (ref 26.0–34.0)
MCHC: 31.3 g/dL — ABNORMAL LOW (ref 32.0–36.0)
MCV: 87.3 fL (ref 80.0–100.0)
PLATELETS: 253 10*3/uL (ref 150–440)
RBC: 4.61 MIL/uL (ref 3.80–5.20)
RDW: 15 % — ABNORMAL HIGH (ref 11.5–14.5)
WBC: 16.8 10*3/uL — ABNORMAL HIGH (ref 3.6–11.0)

## 2014-07-02 LAB — GLUCOSE, CAPILLARY
GLUCOSE-CAPILLARY: 428 mg/dL — AB (ref 70–99)
Glucose-Capillary: 202 mg/dL — ABNORMAL HIGH (ref 70–99)
Glucose-Capillary: 324 mg/dL — ABNORMAL HIGH (ref 70–99)
Glucose-Capillary: 287 mg/dL — ABNORMAL HIGH (ref 70–99)

## 2014-07-02 LAB — BASIC METABOLIC PANEL
ANION GAP: 7 (ref 5–15)
BUN: 32 mg/dL — ABNORMAL HIGH (ref 6–20)
CHLORIDE: 100 mmol/L — AB (ref 101–111)
CO2: 32 mmol/L (ref 22–32)
Calcium: 8.8 mg/dL — ABNORMAL LOW (ref 8.9–10.3)
Creatinine, Ser: 1 mg/dL (ref 0.44–1.00)
GFR calc non Af Amer: 56 mL/min — ABNORMAL LOW (ref >60)
Glucose, Bld: 251 mg/dL — ABNORMAL HIGH (ref 65–99)
POTASSIUM: 4.4 mmol/L (ref 3.5–5.1)
SODIUM: 139 mmol/L (ref 135–145)

## 2014-07-02 MED ORDER — PREDNISONE 10 MG PO TABS: 10.0000 mg | ORAL_TABLET | Freq: Every day | ORAL | Status: DC

## 2014-07-02 MED ORDER — TIOTROPIUM BROMIDE MONOHYDRATE 18 MCG IN CAPS: 18.0000 ug | ORAL_CAPSULE | Freq: Every day | RESPIRATORY_TRACT | Status: DC

## 2014-07-02 MED ORDER — TRAZODONE HCL 100 MG PO TABS: 100.0000 mg | ORAL_TABLET | Freq: Every day | ORAL | Status: DC

## 2014-07-02 MED ORDER — INSULIN ASPART 100 UNIT/ML ~~LOC~~ SOLN: 10.0000 [IU] | Freq: Three times a day (TID) | SUBCUTANEOUS | Status: DC

## 2014-07-02 MED ORDER — INSULIN GLARGINE 100 UNIT/ML ~~LOC~~ SOLN: 30.0000 [IU] | Freq: Every day | SUBCUTANEOUS | Status: DC
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
## 2014-07-02 NOTE — Discharge Summary (Addendum)
[place] [facility] [place] NAME: [name] [name]    MR#:  [id number]  DATE OF BIRTH:  09/15/1945  DATE OF ADMISSION:  06/29/2014 ADMITTING PHYSICIAN: Gladstone Lighter, MD  DATE OF DISCHARGE: 07/03/2014  PRIMARY CARE PHYSICIAN: Does not have one.  Will follow up w/ Dr. Devona Konig from Pulmonary.     ADMISSION DIAGNOSIS:  breathing diff rm 17  DISCHARGE DIAGNOSIS:  Principal Problem:   Acute respiratory failure   SECONDARY DIAGNOSIS:   Past Medical History  Diagnosis Date  . DM2 (diabetes mellitus, type 2)   . Hypertension   . Hypercholesteremia   . Schizophrenia   . Osteoarthritis   . Bursitis   . OSA (obstructive sleep apnea)     does not use machine  . Left ventricular outflow tract obstruction     a. echo 03/2014: EF 60-65%, hypernamic LV systolic fxn, mod LVH w/ LVOT gradient estimated at 68 mm Hg w/ valsalva, very small LV internal cavity size, mildly increased LV posterior wall thickness, mild Ao valve scl w/o stenosis, diastolic dysfunction, normal RVSP  . LVH (left ventricular hypertrophy)     a. echo suggests long standing uncontrolled htn. she will not do well when dehydrated, LV cavity obliteration  . Obesity   . CHF (congestive heart failure)   . Anxiety     HOSPITAL COURSE:   The patient is a 69 year old African-American female with past medical history significant for COPD on chronic on chronic 2 L home oxygen, diastolic congestive heart failure obstructive sleep apnea not using her CPAP, hypertension and insulin-dependent diabetes mellitus and schizophrenia presents from home secondary to acute respiratory distress. She was noted to be hypoxic and confused in the hospital.   #1 Altered mental status : This was secondary to hypoxic hypercarbic respiratory failure patient was admitted to the hospital placed on BiPAP and hypercarbia has improved and her mental status is not back to baseline  #2 acute on chronic  respiratory failure secondary - this is secondary to COPD combined with obesity pickwickian syndrome and underlying sleep apnea.  Patient was treated aggressively with IV steroids also given BiPAP intermittently placed on around-the-clock nebulized treatments and tingling on her Dulera and Spiriva a pulmonary consult was also obtained and agreed with this management patient after aggressive therapy has significantly improved and therefore being discharged on oral prednisone taper along with her maintenance inhalers. - Patient would likely benefit from noninvasive ventilation given her obesity pickwickian syndrome and COPD and that is being arranged prior to discharge - Patient is arty and oxygen at home 2-3 L and she will continue that  #3 insulin-dependent diabetes mellitus - his blood sugars remained quite labile in the hospital and this was likely secondary to her receiving steroids although after advancing her Lantus and her NovoLog with meals and resuming her glipizide blood sugars have improved she will continue the regimen as mentioned below  #4 hypertension - bp stable.  - continue lisinopril and verapamil  #5 schizophrenia -Continue her fluphenazine and olanzapine.  #6 congestive heart failure -his chronic diastolic CHF patient was not in congestive heart failure while in the hospital she will continue follow-up as an outpatient she will continue Lasix on an as-needed basis  #7 Suspected OSA - patient is being arranged for noninvasive ventilation which is to help with her sleep apnea. She has been referred to pulmonary Dr. Devona Konig as outpatient.   Patient is being discharged with home health physical therapy  nursing social work Music therapist and psychiatric nursing services.    DISCHARGE CONDITIONS:   Stable  CONSULTS OBTAINED:  Treatment Team:  Flora Lipps, MD  DRUG ALLERGIES:   Allergies  Allergen Reactions  . Haldol [Haloperidol Decanoate] Swelling and Other (See Comments)     Tongue swells and blurred vision  . Metformin Diarrhea  . Raspberry Swelling    Other reaction(s): SWELLINGin lips    DISCHARGE MEDICATIONS:   Current Discharge Medication List    START taking these medications   Details  insulin aspart (NOVOLOG) 100 UNIT/ML injection Inject 10 Units into the skin 3 (three) times daily with meals. Qty: 10 mL, Refills: 2    predniSONE (DELTASONE) 10 MG tablet Take 1 tablet (10 mg total) by mouth daily with breakfast. Qty: 15 tablet, Refills: 1    tiotropium (SPIRIVA) 18 MCG inhalation capsule Place 1 capsule (18 mcg total) into inhaler and inhale daily. Qty: 30 capsule, Refills: 12      CONTINUE these medications which have CHANGED   Details  insulin glargine (LANTUS) 100 UNIT/ML injection Inject 0.3 mLs (30 Units total) into the skin at bedtime. Qty: 10 mL, Refills: 2    traZODone (DESYREL) 100 MG tablet Take 1 tablet (100 mg total) by mouth at bedtime. Qty: 30 tablet, Refills: 1      CONTINUE these medications which have NOT CHANGED   Details  acetaminophen (TYLENOL) 325 MG tablet Take 650 mg by mouth every 4 (four) hours as needed.    aspirin EC 81 MG tablet Take 81 mg by mouth daily.      atorvastatin (LIPITOR) 20 MG tablet Take 20 mg by mouth daily.    benztropine (COGENTIN) 1 MG tablet Take 1 mg by mouth 2 (two) times daily.    fluPHENAZine (PROLIXIN) 5 MG tablet Take 10 mg by mouth at bedtime.     fluPHENAZine decanoate (PROLIXIN) 25 MG/ML injection Inject 50 mg into the muscle every 14 (fourteen) days. Unknown dose. Given at a [facility], states she will now have ACT team coming to her home to administer    glipiZIDE (GLUCOTROL) 10 MG tablet Take 10 mg by mouth 2 (two) times daily before a meal.    lisinopril (PRINIVIL,ZESTRIL) 5 MG tablet Take 5 mg by mouth daily.    LORazepam (ATIVAN) 0.5 MG tablet Take 0.5 mg by mouth every 8 (eight) hours.    ranitidine (ZANTAC) 150 MG tablet Take 150 mg by mouth  daily.      sitaGLIPtin (JANUVIA) 100 MG tablet Take 100 mg by mouth daily.    verapamil (CALAN) 40 MG tablet Take 40 mg by mouth 3 (three) times daily.         DISCHARGE INSTRUCTIONS:   DIET:  Diabetic diet  DISCHARGE CONDITION:  Good  ACTIVITY:  Activity as tolerated  OXYGEN:  Home Oxygen: Yes.     Oxygen Delivery: 2L % O2 via Patient connected to nasal cannula oxygen  DISCHARGE LOCATION:  Home w/ Home health RN, PT, Aid, Social Work, Psych Nursing.     If you experience worsening of your admission symptoms, develop shortness of breath, life threatening emergency, suicidal or homicidal thoughts you must seek medical attention immediately by calling 911 or calling your MD immediately  if symptoms less severe.  You Must read complete instructions/literature along with all the possible adverse reactions/side effects for all the Medicines you take and that have been prescribed to you. Take any new Medicines after you have completely  understood and accpet all the possible adverse reactions/side effects.   Please note  You were cared for by a hospitalist during your hospital stay. If you have any questions about your discharge medications or the care you received while you were in the hospital after you are discharged, you can call the unit and asked to speak with the hospitalist on call if the hospitalist that took care of you is not available. Once you are discharged, your primary care physician will handle any further medical issues. Please note that NO REFILLS for any discharge medications will be authorized once you are discharged, as it is imperative that you return to your primary care physician (or establish a relationship with a primary care physician if you do not have one) for your aftercare needs so that they can reassess your need for medications and monitor your lab values.     Today    VITAL SIGNS:  Blood pressure 155/83, pulse 81, temperature 98.5 F (36.9 C),  temperature source Oral, resp. rate 20, height 5\' 7"  (1.702 m), weight 127.007 kg (280 lb), SpO2 100 %.  I/O:   Intake/Output Summary (Last 24 hours) at 07/02/14 1609 Last data filed at 07/02/14 1532  Gross per 24 hour  Intake    480 ml  Output   2825 ml  Net  -2345 ml    PHYSICAL EXAMINATION:  GENERAL:  69 y.o.-year-old obese patient sitting up in chair in NAD.  EYES: Pupils equal, round, reactive to light and accommodation. No scleral icterus. Extraocular muscles intact.  HEENT: Head atraumatic, normocephalic. Oropharynx and nasopharynx clear.  NECK:  Supple, no jugular venous distention. No thyroid enlargement, no tenderness.  LUNGS: No use of accessory muscles of respiration. Minimal wheezing b/l.  No rales, rhonchi.  CARDIOVASCULAR: S1, S2 normal. No murmurs, rubs, or gallops.  ABDOMEN: Soft, non-tender, non-distended. Bowel sounds present. No organomegaly or mass.  EXTREMITIES: No pedal edema, cyanosis, or clubbing.  NEUROLOGIC: Cranial nerves II through XII are intact. No focal motor or sensory defecits b/l.  PSYCHIATRIC: The patient is alert and oriented x 3. Anxious SKIN: No obvious rash, lesion, or ulcer.   DATA REVIEW:   CBC  Recent Labs Lab 07/02/14 0429  WBC 16.8*  HGB 12.6  HCT 40.3  PLT 253    Chemistries   Recent Labs Lab 06/29/14 0552  07/02/14 0429  NA 140  < > 139  K 3.7  < > 4.4  CL 98*  < > 100*  CO2 33*  < > 32  GLUCOSE 318*  < > 251*  BUN 16  < > 32*  CREATININE 0.89  < > 1.00  CALCIUM 8.9  < > 8.8*  AST 22  --   --   ALT 23  --   --   ALKPHOS 71  --   --   BILITOT 0.3  --   --   < > = values in this interval not displayed.  Cardiac Enzymes  Recent Labs Lab 06/29/14 0552  TROPONINI <0.03    Microbiology Results  Results for orders placed or performed in visit on 12/17/12  Urine culture     Status: None   Collection Time: 12/18/12 11:41 AM  Result Value Ref Range Status   Micro Text Report   Final       SOURCE: CLEAN  CATCH    COMMENT                   MIXED BACTERIAL  ORGANISMS   COMMENT                   RESULTS SUGGESTIVE OF CONTAMINATION   ANTIBIOTIC                                                        RADIOLOGY:  No results found.    Management plans discussed with the patient, family and they are in agreement.  CODE STATUS:     Code Status Orders        Start     Ordered   06/29/14 1524  Full code   Continuous     06/29/14 1523      TOTAL TIME TAKING CARE OF THIS PATIENT: 45 minutes.    [name] M.D on 07/02/2014 at 4:09 PM  Between 7am to 6pm - Pager - [phone]  After 6pm go to [email or web address] - password [email or web address] [facility]  Office  [phone]  CC: Primary care physician; PROVIDER NOT IN SYSTEM

## 2014-07-02 NOTE — Discharge Instructions (Addendum)
°  DIET:  Cardiac diet, Diabetic diet and Low fat, Low cholesterol diet  DISCHARGE CONDITION:  Stable  ACTIVITY:  Activity as tolerated  OXYGEN:  Home Oxygen: Yes.     Oxygen Delivery: 2 L% O2 via Patient connected to nasal cannula oxygen  DISCHARGE LOCATION:  Home with [place].    If you experience worsening of your admission symptoms, develop shortness of breath, life threatening emergency, suicidal or homicidal thoughts you must seek medical attention immediately by calling 911 or calling your MD immediately  if symptoms less severe.  You Must read complete instructions/literature along with all the possible adverse reactions/side effects for all the Medicines you take and that have been prescribed to you. Take any new Medicines after you have completely understood and accpet all the possible adverse reactions/side effects.   Please note  You were cared for by a hospitalist during your hospital stay. If you have any questions about your discharge medications or the care you received while you were in the hospital after you are discharged, you can call the unit and asked to speak with the hospitalist on call if the hospitalist that took care of you is not available. Once you are discharged, your primary care physician will handle any further medical issues. Please note that NO REFILLS for any discharge medications will be authorized once you are discharged, as it is imperative that you return to your primary care physician (or establish a relationship with a primary care physician if you do not have one) for your aftercare needs so that they can reassess your need for medications and monitor your lab values.  Care Coordination Request was made through [place] ( [phone] [employer] for the NIPPV machine ( like a cpap)  1 [place] will establish direct contact with patient's son  Son is planning to take patient to his home to live and will move  patient's 02 equipment to his home.  [place] [phone]

## 2014-07-02 NOTE — Progress Notes (Signed)
Discharge instructions given to patient at 1520. Son, [name], has been called by patient to pick her up once he is off work. Education given for prednisone and patient instructed on insulin doses with meals and at night. COPD booklet given and acute respiratory failure education. Patient verbalized understanding and requested that the medications to be given be reviewed with her son upon his arrival. No telemetry ordered. IV removed. Per patient, son will not arrive until around 2000-2100. Will pass on to night shift to review medication list when he arrives.

## 2014-07-02 NOTE — Progress Notes (Signed)
Pt is alert and oriented. Pt report pain x1 during shift. Pt rested comfortably the rest of the shift. No other signs of distress noted. Will continue to monitor.

## 2014-07-02 NOTE — Progress Notes (Signed)
PT Cancellation Note  Patient Details Name: [name] [name] MRN: [id number] DOB: Jun 24, 1945   Cancelled Treatment:    Reason Eval/Treat Not Completed: Patient declined, no reason specified.  Pt is leaving and will need 24/7 assistance to be safe at home.   Ramond Dial 07/02/2014, 10:49 AM   Mee Hives, PT MS Acute Rehab Dept. Number: [facility] [id number] and [place] [phone]

## 2014-07-02 NOTE — Progress Notes (Signed)
Inpatient Diabetes Program Recommendations  AACE/ADA: New Consensus Statement on Inpatient Glycemic Control (2013)  Target Ranges:  Prepandial:   less than 140 mg/dL      Peak postprandial:   less than 180 mg/dL (1-2 hours)      Critically ill patients:  140 - 180 mg/dL   Inpatient Diabetes Program Recommendations Correction (SSI): consider increasing to resistant during steroid therapy and/or increasing Novolog meal coverage Thank you  Raoul Pitch BSN, RN,CDE Inpatient Diabetes Coordinator [phone] (team pager)

## 2014-07-02 NOTE — Care Management (Signed)
Patient is for discharge home today.  CM contacted [place] ([phone]) to discuss concerns that mental health issues may be interfering with patient's ability to comply with her treatment plan for her complicated medical issues.  Made a care coordination request.  Spoke with Jeneen Rinks from [facility].  Patient will benefit from NIPPV rather than cpap/bipap.   He discussed this with attending and obtained order.  Will cancel the outpatient sleep study referral.    Huey Romans will contact patient's son [name] to coordinate referral for the NIPPV.  Spoke with kelly and updated him on the referral  To [employer] and [facility].  Again discussed the concerns that patient can not manage her care alone.  He states that he will be taking patient to his home- [address], [place]  .  Phone number in that home is [phone].  Notified [facility] and faxed discharge information.  Updated primary nurse.

## 2014-07-03 LAB — GLUCOSE, CAPILLARY
Glucose-Capillary: 201 mg/dL — ABNORMAL HIGH (ref 70–99)
Glucose-Capillary: 282 mg/dL — ABNORMAL HIGH (ref 70–99)
Glucose-Capillary: 197 mg/dL — ABNORMAL HIGH (ref 70–99)

## 2014-07-03 LAB — BLOOD GAS, ARTERIAL
Acid-Base Excess: 5.8 mmol/L — ABNORMAL HIGH (ref 0.0–3.0)
Allens test (pass/fail): POSITIVE — AB
BICARBONATE: 35.2 meq/L — AB (ref 21.0–28.0)
FIO2: 0.32 %
O2 Saturation: 97.3 %
PH ART: 7.28 — AB (ref 7.350–7.450)
Patient temperature: 37
pCO2 arterial: 75 mmHg (ref 32.0–48.0)
pO2, Arterial: 105 mmHg (ref 83.0–108.0)

## 2014-07-03 NOTE — Progress Notes (Signed)
Physical Therapy Treatment Patient Details Name: [name] [name] MRN: [id number] DOB: 1945-04-23 Today's Date: 07/03/2014    History of Present Illness 69 yo female with onset of respiratory distress, has elevated BS today    PT Comments    Pt is demonstrating much better effort today, with more control and ability to stand and walk.  Due to minor moments of LOB would need at least initially some around the clock assist at home.  Pt is stating her son cannot do this but will not qualify for SNF care due to her distances with gait.  Follow Up Recommendations  Home health PT;Supervision/Assistance - 24 hour     Equipment Recommendations  None recommended by PT    Recommendations for Other Services       Precautions / Restrictions Precautions Precautions: Fall Restrictions Weight Bearing Restrictions: No    Mobility  Bed Mobility Overal bed mobility: Needs Assistance Bed Mobility: Supine to Sit;Sit to Supine     Supine to sit: Min assist Sit to supine: Min assist   General bed mobility comments: able to do with HOB down  Transfers Overall transfer level: Needs assistance Equipment used: Rolling walker (2 wheeled);1 person hand held assist Transfers: Sit to/from [facility] Sit to Stand: Min guard Stand pivot transfers: Min guard       General transfer comment: able to stand well but really doesn't control descent to sit  Ambulation/Gait Ambulation/Gait assistance: Min guard Ambulation Distance (Feet): 300 Feet Assistive device: Rolling walker (2 wheeled);1 person hand held assist Gait Pattern/deviations: Step-through pattern;Decreased stride length;Drifts right/left;Wide base of support Gait velocity: reduced Gait velocity interpretation: Below normal speed for age/gender General Gait Details: no complaints during or after gait, cheery in greeting staff   Stairs            Wheelchair Mobility    Modified Rankin (Stroke Patients  Only)       Balance Overall balance assessment: Needs assistance Sitting-balance support: Feet supported Sitting balance-Leahy Scale: Good   Postural control: Posterior lean Standing balance support: Bilateral upper extremity supported Standing balance-Leahy Scale: Fair Standing balance comment: fair- episode upon initial standing with walker and PT controlled tank.  Has a rollator for transporting O2 at home                    Cognition Arousal/Alertness: Awake/alert Behavior During Therapy: [facility] for tasks assessed/performed;Anxious Overall Cognitive Status: Within Functional Limits for tasks assessed                      Exercises General Exercises - Lower Extremity Ankle Circles/Pumps: AROM;Both;10 reps    General Comments General comments (skin integrity, edema, etc.): Pt is somewhat unsteady at rare times and would still recommend 24/7 help at home      Pertinent Vitals/Pain Pain Assessment: No/denies pain    Home Living                      Prior Function            PT Goals (current goals can now be found in the care plan section) Acute Rehab PT Goals Patient Stated Goal: To go home, not SNF Progress towards PT goals: Progressing toward goals    Frequency  Min 1X/week    PT Plan Current plan remains appropriate    Co-evaluation             End of Session Equipment Utilized During Treatment:  Oxygen;Gait belt Activity Tolerance: Patient tolerated treatment well Patient left: in bed;with call bell/phone within reach;with bed alarm set     Time: 3912-2583 PT Time Calculation (min) (ACUTE ONLY): 25 min  Charges:  $Gait Training: 8-22 mins $Therapeutic Exercise: 8-22 mins                    G Codes:      Ramond Dial 07-19-2014, 2:04 PM  Mee Hives, PT MS Acute Rehab Dept. Number: [facility] [id number] and [place] [phone]

## 2014-07-03 NOTE — Care Management (Signed)
Patient's NIPVV machine will be set up 07/04/2014. Informed that patient's son  [name] said patient's portable 02 tanks in her home "are empty."  Spoke with son on previous day about portable 02 tanks and he did not mention any problem.  Spoke with Jeneen Rinks from [facility] and requested a portable tank be brought to patient's hospital room.  CM attempted to call [name] and again, unable to leave message as voice mail is full.  Kelly to to take patient "to her home" this evening when he gets off work- around [place] informed of "issue" with portable tanks, NIPPV machine set up scheduled for 5/12 and that it is reported patient will be returning to her own home and not her son's home.  CSW is providing [name] with list of local assisted living facilities.  Patient remains at high risk of readmission due to lack of supervision.  Discussed possible need for APS referral with [facility]

## 2014-07-03 NOTE — Care Management (Signed)
CSW spoke with patient and she declines any plan that involves any facility placement.  Notified  [facility]

## 2014-07-03 NOTE — Progress Notes (Signed)
[facility] [place] NAME: [name] [name]    MR#:  [id number]  DATE OF BIRTH:  Nov 12, 1945  SUBJECTIVE:  CHIEF COMPLAINT:   Chief Complaint  Patient presents with  . Shortness of Breath   Pt. Sitting up in bed in NAD.  Shortness of breath improved.  BS still a bit uncontrolled but improving.  No other complaints.   REVIEW OF SYSTEMS:    Review of Systems  Constitutional: Negative for fever and chills. Diaphoresis: generalized.  HENT: Negative for congestion and tinnitus.   Eyes: Negative for blurred vision and double vision.  Respiratory: Negative for cough, shortness of breath and wheezing.   Cardiovascular: Negative for chest pain, orthopnea and PND.  Gastrointestinal: Negative for nausea, vomiting, abdominal pain and diarrhea.  Genitourinary: Negative for dysuria and hematuria.  Neurological: Positive for weakness. Negative for dizziness, sensory change and focal weakness.    Nutrition:  Diabetic Heart healthy diet Tolerating Diet: Yes Tolerating PT:  Yes      DRUG ALLERGIES:   Allergies  Allergen Reactions  . Haldol [Haloperidol Decanoate] Swelling and Other (See Comments)    Tongue swells and blurred vision  . Metformin Diarrhea  . Raspberry Swelling    Other reaction(s): SWELLINGin lips    VITALS:  Blood pressure 186/114, pulse 83, temperature 98 F (36.7 C), temperature source Oral, resp. rate 18, height 5\' 7"  (1.702 m), weight 125.102 kg (275 lb 12.8 oz), SpO2 98 %.  PHYSICAL EXAMINATION:   Physical Exam  GENERAL:  69 y.o.-year-old obese patient sitting up in chair in NAD.  EYES: Pupils equal, round, reactive to light and accommodation. No scleral icterus. Extraocular muscles intact.  HEENT: Head atraumatic, normocephalic. Oropharynx and nasopharynx clear.  NECK:  Supple, no jugular venous distention. No thyroid enlargement, no tenderness.  LUNGS: Prolonged Insp & exp. Phase.  Minimal end-exp. Wheezing.  (-) use of accessory muscles.  No rhonci/wheezing.  CARDIOVASCULAR: S1, S2 normal. No murmurs, rubs, or gallops.  ABDOMEN: Soft, nontender, nondistended. Bowel sounds present. No organomegaly or mass.  EXTREMITIES: No cyanosis, clubbing.  + 1 edema b/l.  NEUROLOGIC: Cranial nerves II through XII are intact. No focal Motor or sensory deficits b/l.  Globally weak PSYCHIATRIC: The patient is alert and oriented x 3. Anxious  SKIN: No obvious rash, lesion, or ulcer.    LABORATORY PANEL:   CBC  Recent Labs Lab 07/02/14 0429  WBC 16.8*  HGB 12.6  HCT 40.3  PLT 253   ------------------------------------------------------------------------------------------------------------------  Chemistries   Recent Labs Lab 06/29/14 0552  07/02/14 0429  NA 140  < > 139  K 3.7  < > 4.4  CL 98*  < > 100*  CO2 33*  < > 32  GLUCOSE 318*  < > 251*  BUN 16  < > 32*  CREATININE 0.89  < > 1.00  CALCIUM 8.9  < > 8.8*  AST 22  --   --   ALT 23  --   --   ALKPHOS 71  --   --   BILITOT 0.3  --   --   < > = values in this interval not displayed. ------------------------------------------------------------------------------------------------------------------  Cardiac Enzymes  Recent Labs Lab 06/29/14 0552  TROPONINI <0.03   ------------------------------------------------------------------------------------------------------------------  RADIOLOGY:  No results found.   ASSESSMENT AND PLAN:   The patient is a 69 year old African-American female with past medical history significant for COPD on chronic on chronic 2 L home oxygen, diastolic congestive heart  failure obstructive sleep apnea not using her CPAP, hypertension and insulin-dependent diabetes mellitus and schizophrenia presents from home secondary to acute respiratory distress. She was noted to be hypoxic and confused in the hospital.   * Altered mental status : secondary to Hypercarbic Resp. failure, metabolic encephalopathy.  -  improved w/ bipap and mental status back to baseline.  Hypercarbia resolved.   * acute on chronic respiratory failure secondary to COPD exacerbation - improved. - on IV steroids and will cont. To taper, cont. ATC nebs. Cont. Dulera, Spiriva. Cont. Pulmicort.   - PRN bipap.  Appreciate Pulm. Consult and cont. Current care.  - already on o2 at home.   * insulin-dependent diabetes mellitus - BS elevated due to steroids.   - cont. Lantus, Novolog w. Meals.  Cont. Glipizide.  BS improving.   * hypertension - bp stable.  - continue lisinopril and verapamil  * schizophrenia -  Continue her fluphenazine and olanzapine.  * congestive heart failure - hx of chronic diastolic CHF.   - clinically not in CHF.  Lasix on PRN basis.   * Suspected OSA - needs an official sleep study as outpatient to get CPAP at home.  - will refer to pulm. As outpatient upon discharge (Dr. Humphrey Rolls)  Seen by PT and will d/c home w/ home health servies.   All the records are reviewed and case discussed with Care Management/Social Workerr. Management plans discussed with the patient, family and they are in agreement.  CODE STATUS: Full   DVT Prophylaxis: Heparin SQ  TOTAL TIME TAKING CARE OF THIS PATIENT: 30 minutes.   POSSIBLE D/C tomorrow a.m. If doing well and BS improved.    [name] M.D on 07/03/2014 at 8:19 AM  Between 7am to 6pm - Pager - [phone]  After 6pm go to [email or web address] - password [email or web address] [facility] [facility]  Office  [phone]  CC: Primary care physician; PROVIDER NOT IN SYSTEM

## 2014-07-03 NOTE — Clinical Social Work Note (Signed)
CSW spoke to pt.  She was oriented x3, able to tell her correct birth date, and who the current president is.  Pt stated adamantly that she did not want to go to any facility.  She stated very clearly that she would rather go home than to any facility.  Pt's son states that he has guardianship over pt, but there is currently no copy of any guardian paperwork, and pt does not meet criteria to go to a SNF.  CSW did ask pt's son to bring a copy of this paperwork, however per pt's son he cannot find a copy of this paperwork.  CSW did put a list of ALF and family care homes with pt's chart and notified RN that this needed to be given to the pt up on DC.  [place] signing off.

## 2014-07-03 NOTE — Care Management (Signed)
Patient did not discharge last pm as planned.  It is reported by nursing that son wishes for patient to go to [facility].  UPdated CSW.  Patient has [employer] and for skilled nursing visit to be covered, must be approved by insurance.  Physical therapy has continued to recommend home with home health.  This has been explained to patient's son during this admission.  Will ask CSW to submit information to patient's insurance company for a determinaton

## 2014-07-03 NOTE — Progress Notes (Signed)
Inpatient Diabetes Program Recommendations  AACE/ADA: New Consensus Statement on Inpatient Glycemic Control (2013)  Target Ranges:  Prepandial:   less than 140 mg/dL      Peak postprandial:   less than 180 mg/dL (1-2 hours)      Critically ill patients:  140 - 180 mg/dL   Results for [name] [name], [name] [name] (MRN [id number]) as of 07/03/2014 08:54  Ref. Range 07/02/2014 07:33 07/02/2014 11:34 07/02/2014 16:21 07/02/2014 19:32 07/03/2014 07:24  Glucose-Capillary Latest Ref Range: 70-99 mg/dL 202 (H) 324 (H) 287 (H) 428 (H) 201 (H)   Diabetes history: DM2 Outpatient Diabetes medications: Lantus 15 units QHS, Glipizide 10 mg BID, Januvia 100 mg daily Current orders for Inpatient glycemic control: Lantus 30 units QHS, Novolog 0-15 units TID with meals, Novolog 0-5 units HS, Novolog 5 units TID with meals for meal coverage, Glipizide 10 mg BID, Tradjenta 5 mg daily  Inpatient Diabetes Program Recommendations Insulin - Basal: Fasting glucose 201 mg/dl this morning. Please consider increasing Lantus to 35 units QHS. Correction (SSI): While inpatient and ordered steroids,please consider increasing Novolog correction to resistant scale. Insulin - Meal Coverage: If steroids are continued as ordered, please consider increasing meal coverage to Novolog 8 units TID with meals. HgbA1C: A1C 10.4% on 07/01/14.  Thanks, Barnie Alderman, RN, MSN, CCRN, CDE Diabetes Coordinator Inpatient Diabetes Program [phone] (Team Pager from [place] to [place]) [phone] ([facility] office) [phone] [facility] office)

## 2014-07-03 NOTE — Progress Notes (Signed)
Pt alert and oriented. Ho notified of elevated BP and FSBS at start of shift. Discharge cancelled. Pt treated with BP meds and insulin. VSS otherwise. NO tele. No complaints of pain. No IV access at this time- see order. Pt and family need SW to facilitate dc, as DC plans have changed- see sticky note. No further complaints- continue to monitor.

## 2014-07-04 LAB — BLOOD GAS, ARTERIAL
Acid-Base Excess: 6.8 mmol/L — ABNORMAL HIGH (ref 0.0–3.0)
BICARBONATE: 34.1 meq/L — AB (ref 21.0–28.0)
Delivery systems: POSITIVE
Expiratory PAP: 5
FIO2: 0.3 %
Inspiratory PAP: 12
O2 Saturation: 97.5 %
PEEP: 5 cmH2O
PO2 ART: 99 mmHg (ref 83.0–108.0)
Patient temperature: 37
RATE: 12 resp/min
pCO2 arterial: 59 mmHg — ABNORMAL HIGH (ref 32.0–48.0)
pH, Arterial: 7.37 (ref 7.350–7.450)

## 2014-07-09 DIAGNOSIS — E119 Type 2 diabetes mellitus without complications: Secondary | ICD-10-CM | POA: Diagnosis not present

## 2014-07-09 DIAGNOSIS — R0600 Dyspnea, unspecified: Secondary | ICD-10-CM | POA: Diagnosis not present

## 2014-07-09 DIAGNOSIS — Z7952 Long term (current) use of systemic steroids: Secondary | ICD-10-CM | POA: Insufficient documentation

## 2014-07-09 DIAGNOSIS — R0602 Shortness of breath: Secondary | ICD-10-CM | POA: Diagnosis present

## 2014-07-09 DIAGNOSIS — Z794 Long term (current) use of insulin: Secondary | ICD-10-CM | POA: Diagnosis not present

## 2014-07-09 DIAGNOSIS — Z7982 Long term (current) use of aspirin: Secondary | ICD-10-CM | POA: Insufficient documentation

## 2014-07-09 DIAGNOSIS — Z79899 Other long term (current) drug therapy: Secondary | ICD-10-CM | POA: Insufficient documentation

## 2014-07-09 DIAGNOSIS — J705 Respiratory conditions due to smoke inhalation: Secondary | ICD-10-CM | POA: Insufficient documentation

## 2014-07-09 DIAGNOSIS — Y998 Other external cause status: Secondary | ICD-10-CM | POA: Diagnosis not present

## 2014-07-09 DIAGNOSIS — I10 Essential (primary) hypertension: Secondary | ICD-10-CM | POA: Diagnosis not present

## 2014-07-09 DIAGNOSIS — Y92009 Unspecified place in unspecified non-institutional (private) residence as the place of occurrence of the external cause: Secondary | ICD-10-CM | POA: Insufficient documentation

## 2014-07-09 DIAGNOSIS — Z87891 Personal history of nicotine dependence: Secondary | ICD-10-CM | POA: Diagnosis not present

## 2014-07-09 DIAGNOSIS — Y9389 Activity, other specified: Secondary | ICD-10-CM | POA: Insufficient documentation

## 2014-07-10 ENCOUNTER — Encounter: Payer: Self-pay | Admitting: Emergency Medicine

## 2014-07-10 ENCOUNTER — Emergency Department
Admission: EM | Admit: 2014-07-10 | Discharge: 2014-07-10 | Disposition: A | Discharge status: Home / Self Care | Payer: Medicare HMO | Attending: Emergency Medicine | Admitting: Emergency Medicine

## 2014-07-10 DIAGNOSIS — T59811A Toxic effect of smoke, accidental (unintentional), initial encounter: Secondary | ICD-10-CM

## 2014-07-10 DIAGNOSIS — R0600 Dyspnea, unspecified: Secondary | ICD-10-CM

## 2014-07-10 DIAGNOSIS — J705 Respiratory conditions due to smoke inhalation: Secondary | ICD-10-CM

## 2014-07-10 HISTORY — DX: Chronic obstructive pulmonary disease, unspecified: J44.9

## 2014-07-10 LAB — CARBOXYHEMOGLOBIN
Carboxyhemoglobin: 3.4 % (ref 1.5–9.0)
Methemoglobin: 0.3 %
Total oxygen content: 96.5 mL/dL

## 2014-07-10 NOTE — Discharge Instructions (Signed)

## 2014-07-10 NOTE — ED Notes (Addendum)
Pt placed on nonrebreather at [facility] per Owens Shark, MD. Respiratory called to draw carboxyhemoglobin.
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
## 2014-07-18 ENCOUNTER — Emergency Department
Admission: EM | Admit: 2014-07-18 | Discharge: 2014-07-19 | Disposition: A | Discharge status: Home / Self Care | Payer: Medicare HMO | Attending: Emergency Medicine | Admitting: Emergency Medicine

## 2014-07-18 ENCOUNTER — Emergency Department: Payer: Medicare HMO

## 2014-07-18 ENCOUNTER — Encounter: Payer: Self-pay | Admitting: Emergency Medicine

## 2014-07-18 DIAGNOSIS — F209 Schizophrenia, unspecified: Secondary | ICD-10-CM | POA: Insufficient documentation

## 2014-07-18 DIAGNOSIS — Z79899 Other long term (current) drug therapy: Secondary | ICD-10-CM | POA: Insufficient documentation

## 2014-07-18 DIAGNOSIS — I10 Essential (primary) hypertension: Secondary | ICD-10-CM | POA: Insufficient documentation

## 2014-07-18 DIAGNOSIS — Z87891 Personal history of nicotine dependence: Secondary | ICD-10-CM | POA: Diagnosis not present

## 2014-07-18 DIAGNOSIS — F419 Anxiety disorder, unspecified: Secondary | ICD-10-CM | POA: Insufficient documentation

## 2014-07-18 DIAGNOSIS — E119 Type 2 diabetes mellitus without complications: Secondary | ICD-10-CM | POA: Insufficient documentation

## 2014-07-18 DIAGNOSIS — M791 Myalgia: Secondary | ICD-10-CM | POA: Diagnosis not present

## 2014-07-18 DIAGNOSIS — Z794 Long term (current) use of insulin: Secondary | ICD-10-CM | POA: Diagnosis not present

## 2014-07-18 DIAGNOSIS — R0600 Dyspnea, unspecified: Secondary | ICD-10-CM | POA: Insufficient documentation

## 2014-07-18 DIAGNOSIS — Z0000 Encounter for general adult medical examination without abnormal findings: Secondary | ICD-10-CM

## 2014-07-18 LAB — COMPREHENSIVE METABOLIC PANEL
ALBUMIN: 4.1 g/dL (ref 3.5–5.0)
ALK PHOS: 73 U/L (ref 38–126)
ALT: 29 U/L (ref 14–54)
AST: 26 U/L (ref 15–41)
Anion gap: 13 (ref 5–15)
BILIRUBIN TOTAL: 0.7 mg/dL (ref 0.3–1.2)
BUN: 16 mg/dL (ref 6–20)
CO2: 31 mmol/L (ref 22–32)
Calcium: 9.4 mg/dL (ref 8.9–10.3)
Chloride: 95 mmol/L — ABNORMAL LOW (ref 101–111)
Creatinine, Ser: 1.34 mg/dL — ABNORMAL HIGH (ref 0.44–1.00)
GFR calc non Af Amer: 39 mL/min — ABNORMAL LOW (ref >60)
GFR, EST AFRICAN AMERICAN: 46 mL/min — AB (ref >60)
Glucose, Bld: 257 mg/dL — ABNORMAL HIGH (ref 65–99)
Potassium: 4.2 mmol/L (ref 3.5–5.1)
Sodium: 139 mmol/L (ref 135–145)
TOTAL PROTEIN: 7.8 g/dL (ref 6.5–8.1)

## 2014-07-18 LAB — URINALYSIS COMPLETE WITH MICROSCOPIC (ARMC ONLY)
BILIRUBIN URINE: NEGATIVE
Bacteria, UA: NONE SEEN
Glucose, UA: >500 mg/dL — AB
Hgb urine dipstick: NEGATIVE
LEUKOCYTES UA: NEGATIVE
Nitrite: NEGATIVE
Protein, ur: 30 mg/dL — AB
SPECIFIC GRAVITY, URINE: 1.014 (ref 1.005–1.030)
pH: 6 (ref 5.0–8.0)

## 2014-07-18 LAB — URINE DRUG SCREEN, QUALITATIVE (ARMC ONLY)
AMPHETAMINES, UR SCREEN: NOT DETECTED
BARBITURATES, UR SCREEN: NOT DETECTED
Benzodiazepine, Ur Scrn: POSITIVE — AB
CANNABINOID 50 NG, UR ~~LOC~~: NOT DETECTED
Cocaine Metabolite,Ur ~~LOC~~: NOT DETECTED
MDMA (ECSTASY) UR SCREEN: NOT DETECTED
Methadone Scn, Ur: NOT DETECTED
OPIATE, UR SCREEN: NOT DETECTED
Phencyclidine (PCP) Ur S: NOT DETECTED
Tricyclic, Ur Screen: NOT DETECTED

## 2014-07-18 LAB — CBC WITH DIFFERENTIAL/PLATELET
BASOS ABS: 0.1 10*3/uL (ref 0–0.1)
BASOS PCT: 1 %
EOS PCT: 1 %
Eosinophils Absolute: 0.1 10*3/uL (ref 0–0.7)
HEMATOCRIT: 44.1 % (ref 35.0–47.0)
HEMOGLOBIN: 13.9 g/dL (ref 12.0–16.0)
LYMPHS ABS: 2.6 10*3/uL (ref 1.0–3.6)
LYMPHS PCT: 27 %
MCH: 27.6 pg (ref 26.0–34.0)
MCHC: 31.5 g/dL — AB (ref 32.0–36.0)
MCV: 87.5 fL (ref 80.0–100.0)
Monocytes Absolute: 0.8 10*3/uL (ref 0.2–0.9)
Monocytes Relative: 8 %
NEUTROS PCT: 63 %
Neutro Abs: 6 10*3/uL (ref 1.4–6.5)
PLATELETS: 230 10*3/uL (ref 150–440)
RBC: 5.04 MIL/uL (ref 3.80–5.20)
RDW: 14.3 % (ref 11.5–14.5)
WBC: 9.5 10*3/uL (ref 3.6–11.0)

## 2014-07-18 LAB — SALICYLATE LEVEL: Salicylate Lvl: <4 mg/dL (ref 2.8–30.0)

## 2014-07-18 LAB — ETHANOL

## 2014-07-18 LAB — TROPONIN I

## 2014-07-18 LAB — BRAIN NATRIURETIC PEPTIDE: B NATRIURETIC PEPTIDE 5: 19 pg/mL (ref 0.0–100.0)

## 2014-07-18 LAB — ACETAMINOPHEN LEVEL: Acetaminophen (Tylenol), Serum: <10 ug/mL — ABNORMAL LOW (ref 10–30)

## 2014-07-18 MED ORDER — FUROSEMIDE 10 MG/ML IJ SOLN
20.0000 mg | Freq: Once | INTRAMUSCULAR | Status: AC
  Administered 2014-07-18: 20 mg via INTRAVENOUS

## 2014-07-18 MED ORDER — FUROSEMIDE 10 MG/ML IJ SOLN
INTRAMUSCULAR | Status: AC
  Filled 2014-07-18: qty 4

## 2014-07-18 NOTE — ED Provider Notes (Signed)
[facility] Emergency Department Provider Note  ____________________________________________  Time seen: Approximately 8:41 AM  I have reviewed the triage vital signs and the nursing notes.   HISTORY  Chief Complaint Respiratory Distress    HPI [name] [name] is a 69 y.o. female patient brought in by EMS reports history of diabetes congestive heart failure hypertension. patient reports her son gives her her medicines. She reports the judge has awarded to her son custody of her disability check. patient reports that her son is a Armed forces technical officer. patient reports that her son gave her a bunch of extra medicines last night that he does not usually give her she took a lot of extra pills last night EMS says that on their arrival her patient and her pupils were pinpoint. EMS reports patient sats were 96% on room air patient reports she uses CPAP half the night to help her with her sleep apnea. Patient reports that she was more short of breath since last night than usual   Past Medical History  Diagnosis Date  . DM2 (diabetes mellitus, type 2)   . Hypertension   . Hypercholesteremia   . Schizophrenia   . Osteoarthritis   . Bursitis   . OSA (obstructive sleep apnea)     does not use machine  . Left ventricular outflow tract obstruction     a. echo 03/2014: EF 60-65%, hypernamic LV systolic fxn, mod LVH w/ LVOT gradient estimated at 68 mm Hg w/ valsalva, very small LV internal cavity size, mildly increased LV posterior wall thickness, mild Ao valve scl w/o stenosis, diastolic dysfunction, normal RVSP  . LVH (left ventricular hypertrophy)     a. echo suggests long standing uncontrolled htn. she will not do well when dehydrated, LV cavity obliteration  . Obesity   . CHF (congestive heart failure)   . Anxiety   . COPD (chronic obstructive pulmonary disease)     Patient Active Problem List   Diagnosis Date Noted  . Acute respiratory failure 06/29/2014  . Chronic  diastolic CHF (congestive heart failure) 05/15/2014  . Obesity   . LVH (left ventricular hypertrophy)   . Left ventricular outflow tract obstruction   . Can't get food down 03/09/2013  . Acid reflux 03/09/2013  . History of colon polyps 03/09/2013  . Obstructive apnea 03/09/2013  . Absence of bladder continence 03/09/2013  . Renal mass 06/26/2011  . Lytic bone lesion of hip 06/25/2011  . Hypertension 06/24/2011  . Rectal bleeding 06/24/2011  . Schizophrenia 06/24/2011  . Diabetes mellitus 06/24/2011  . Hypokalemia 06/24/2011    Past Surgical History  Procedure Laterality Date  . Tonsillectomy    . Knee reconstruction, medial patellar femoral ligament    . Cholecystectomy    . Colonoscopy  04/2011    [facility] per patient incomplete    Current Outpatient Rx  Name  Route  Sig  Dispense  Refill  . acetaminophen (TYLENOL) 325 MG tablet   Oral   Take 650 mg by mouth every 4 (four) hours as needed.         Marland Kitchen aspirin EC 81 MG tablet   Oral   Take 81 mg by mouth daily.           Marland Kitchen atorvastatin (LIPITOR) 20 MG tablet   Oral   Take 20 mg by mouth daily.         . benztropine (COGENTIN) 1 MG tablet   Oral   Take 1 mg by mouth 2 (two) times  daily.         . fluPHENAZine (PROLIXIN) 5 MG tablet   Oral   Take 10 mg by mouth at bedtime.          . fluPHENAZine decanoate (PROLIXIN) 25 MG/ML injection   Intramuscular   Inject 50 mg into the muscle every 14 (fourteen) days. Unknown dose. Given at a [facility], states she will now have ACT team coming to her home to administer         . glipiZIDE (GLUCOTROL) 10 MG tablet   Oral   Take 10 mg by mouth 2 (two) times daily before a meal.         . insulin aspart (NOVOLOG) 100 UNIT/ML injection   Subcutaneous   Inject 10 Units into the skin 3 (three) times daily with meals.   10 mL   2   . insulin glargine (LANTUS) 100 UNIT/ML injection   Subcutaneous   Inject 0.3 mLs (30 Units total) into the skin at  bedtime.   10 mL   2   . lisinopril (PRINIVIL,ZESTRIL) 5 MG tablet   Oral   Take 5 mg by mouth daily.         Marland Kitchen LORazepam (ATIVAN) 0.5 MG tablet   Oral   Take 0.5 mg by mouth every 8 (eight) hours.         . predniSONE (DELTASONE) 10 MG tablet   Oral   Take 1 tablet (10 mg total) by mouth daily with breakfast.   15 tablet   1     Label  & dispense according to the schedule below. ...   . ranitidine (ZANTAC) 150 MG tablet   Oral   Take 150 mg by mouth daily.           . sitaGLIPtin (JANUVIA) 100 MG tablet   Oral   Take 100 mg by mouth daily.         Marland Kitchen tiotropium (SPIRIVA) 18 MCG inhalation capsule   Inhalation   Place 1 capsule (18 mcg total) into inhaler and inhale daily.   30 capsule   12   . traZODone (DESYREL) 100 MG tablet   Oral   Take 1 tablet (100 mg total) by mouth at bedtime.   30 tablet   1   . verapamil (CALAN) 40 MG tablet   Oral   Take 40 mg by mouth 3 (three) times daily.           Allergies Haldol; Metformin; and Raspberry  Family History  Problem Relation Age of Onset  . Colon cancer Neg Hx   . Liver disease Neg Hx     Social History History  Substance Use Topics  . Smoking status: Former Research scientist (life sciences)  . Smokeless tobacco: Never Used  . Alcohol Use: No    Review of Systems Constitutional: No fever/chills Eyes: No visual changes. ENT: No sore throat. Cardiovascular: Denies chest pain. Gastrointestinal: No abdominal pain.  No nausea, no vomiting.  No diarrhea.  No constipation. Genitourinary: Negative for dysuria. Musculoskeletal: Negative for back pain. Skin: Negative for rash. Neurological: Negative for headaches, focal weakness or numbness.  10-point ROS otherwise negative.  ____________________________________________   PHYSICAL EXAM:  VITAL SIGNS: ED Triage Vitals  Enc Vitals Group     BP --      Pulse --      Resp --      Temp --      Temp src --  SpO2 --      Weight --      Height --      Head Cir  --      Peak Flow --      Pain Score --      Pain Loc --      Pain Edu? --      Excl. in [place]? --     Constitutional: Alert and oriented. Well appearing and in no acute distress. But tearful and anxious Eyes: Conjunctivae are normal. PERRL. EOMI. Head: Atraumatic. Nose: No congestion/rhinnorhea. Mouth/Throat: Mucous membranes are moist.  Oropharynx non-erythematous. Neck: No stridor.   }Cardiovascular: Normal rate, regular rhythm. Grossly normal heart sounds.  Good peripheral circulation. Respiratory: Normal respiratory effort.  No retractions. Lungs CTAB. Gastrointestinal: Soft and nontender. No distention. No abdominal bruits. No CVA tenderness. Musculoskeletal: No lower extremity tenderness patient has 1+ edema in both legs.  No joint effusions. Neurologic:  Normal speech and language. No gross focal neurologic deficits are appreciated. Speech is normal for her.  Skin:  Skin is warm, dry and intact. No rash noted. Psychiatric: Mood and affect are normal. Speech and behavior are normal.  ____________________________________________   LABS (all labs ordered are listed, but only abnormal results are displayed)  Labs Reviewed  COMPREHENSIVE METABOLIC PANEL  ETHANOL  SALICYLATE LEVEL  TROPONIN I  ACETAMINOPHEN LEVEL  CBC WITH DIFFERENTIAL/PLATELET  URINALYSIS COMPLETEWITH MICROSCOPIC ([place])  URINE DRUG SCREEN, QUALITATIVE ([place])  BRAIN NATRIURETIC PEPTIDE   ____________________________________________  EKG  EKG read by me and interpreted by me shows normal sinus rhythm at a rate of 97 the left axis nonspecific ST-T wave changes which have been present previously most recent EKG looks the same as this 1 06/18/2014 but there are several other previous EKGs it looks similar to this as well ____________________________________________  RADIOLOGY  No acute disease per radiology ____________________________________________   PROCEDURES  Procedure(s) performed:  None  Critical Care performed: No  ____________________________________________   INITIAL IMPRESSION / ASSESSMENT AND PLAN / ED COURSE  Pertinent labs & imaging results that were available during my care of the patient were reviewed by me and considered in my medical decision making (see chart for details).  Discussed with Dr.Claypacks who knows patient and family  well. He says that the son does have control of her medicines and finances that he is not a drug addict and has not given her any extra medications. We are awaiting the last bit of her labs. If they are normal plan to let her go home.   Labs are normal urine drug screen is normal except for benzodiazepine's which patient takes as a prescription ____________________________________________   FINAL CLINICAL IMPRESSION(S) / ED DIAGNOSES  Final diagnoses:  Well adult exam     Nena Polio, MD 07/18/14 1254

## 2014-07-18 NOTE — ED Notes (Signed)
Meal tray given 

## 2014-07-18 NOTE — ED Notes (Signed)
Pt served pb and crackers and soda, voided on bed pan but overfilled onto bed
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
## 2014-07-18 NOTE — ED Notes (Signed)
BEHAVIORAL HEALTH ROUNDING Patient sleeping: No. Patient alert and oriented: yes Behavior appropriate: Yes.  ; If no, describe:  Nutrition and fluids offered: Yes  Toileting and hygiene offered: Yes  Sitter present: no Law enforcement present: Yes  

## 2014-07-18 NOTE — ED Notes (Addendum)
ENVIRONMENTAL ASSESSMENT Potentially harmful objects out of patient reach: YES Personal belongings secured: YES Patient dressed in hospital provided attire only: YES Plastic bags out of patient reach: YES Patient care equipment (cords, cables, call bells, lines, and drains) shortened, removed, or accounted for: NO patient not medically cleared, patient needs to be on cardiac monitor. Equipment and supplies removed from bottom of stretcher: YES Potentially toxic materials out of patient reach: [employer] container removed or out of patient reach: YES

## 2014-07-18 NOTE — ED Notes (Signed)

## 2014-07-18 NOTE — ED Notes (Signed)
Patient provided with crackers and grape juice.
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
## 2014-07-18 NOTE — ED Notes (Signed)
02 at cannula applied
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
## 2014-07-18 NOTE — Discharge Instructions (Signed)
Diabetes and Standards of Medical Care Diabetes is complicated. You may find that your diabetes team includes a dietitian, nurse, diabetes educator, eye doctor, and more. To help everyone know what is going on and to help you get the care you deserve, the following schedule of care was developed to help keep you on track. Below are the tests, exams, vaccines, medicines, education, and plans you will need. HbA1c test This test shows how well you have controlled your glucose over the past 2-3 months. It is used to see if your diabetes management plan needs to be adjusted.   It is performed at least 2 times a year if you are meeting treatment goals.  It is performed 4 times a year if therapy has changed or if you are not meeting treatment goals. Blood pressure test  This test is performed at every routine medical visit. The goal is less than 140/90 mm Hg for most people, but 130/80 mm Hg in some cases. Ask your health care provider about your goal. Dental exam  Follow up with the dentist regularly. Eye exam  If you are diagnosed with type 1 diabetes as a child, get an exam upon reaching the age of 10 years or older and have had diabetes for 3-5 years. Yearly eye exams are recommended after that initial eye exam.  If you are diagnosed with type 1 diabetes as an adult, get an exam within 5 years of diagnosis and then yearly.  If you are diagnosed with type 2 diabetes, get an exam as soon as possible after the diagnosis and then yearly. Foot care exam  Visual foot exams are performed at every routine medical visit. The exams check for cuts, injuries, or other problems with the feet.  A comprehensive foot exam should be done yearly. This includes visual inspection as well as assessing foot pulses and testing for loss of sensation.  Check your feet nightly for cuts, injuries, or other problems with your feet. Tell your health care provider if anything is not healing. Kidney function test (urine  microalbumin)  This test is performed once a year.  Type 1 diabetes: The first test is performed 5 years after diagnosis.  Type 2 diabetes: The first test is performed at the time of diagnosis.  A serum creatinine and estimated glomerular filtration rate (eGFR) test is done once a year to assess the level of chronic kidney disease (CKD), if present. Lipid profile (cholesterol, HDL, LDL, triglycerides)  Performed every 5 years for most people.  The goal for LDL is less than 100 mg/dL. If you are at high risk, the goal is less than 70 mg/dL.  The goal for HDL is 40 mg/dL-50 mg/dL for men and 50 mg/dL-60 mg/dL for women. An HDL cholesterol of 60 mg/dL or higher gives some protection against heart disease.  The goal for triglycerides is less than 150 mg/dL. Influenza vaccine, pneumococcal vaccine, and hepatitis B vaccine  The influenza vaccine is recommended yearly.  It is recommended that people with diabetes who are over 65 years old get the pneumonia vaccine. In some cases, two separate shots may be given. Ask your health care provider if your pneumonia vaccination is up to date.  The hepatitis B vaccine is also recommended for adults with diabetes. Diabetes self-management education  Education is recommended at diagnosis and ongoing as needed. Treatment plan  Your treatment plan is reviewed at every medical visit. Document Released: 12/06/2008 Document Revised: 06/25/2013 Document Reviewed: 07/11/2012 Exit[employer] Patient Information 2015 [employer]. This information is not intended to replace advice given to you by your health care provider. Make sure you discuss any questions you have with your health care provider.  Preventive Care for Adults A healthy lifestyle and preventive care can promote health and wellness. Preventive health guidelines for women include the following key practices.  A routine yearly physical is a good way to check with your health care provider about  your health and preventive screening. It is a chance to share any concerns and updates on your health and to receive a thorough exam.  Visit your dentist for a routine exam and preventive care every 6 months. Brush your teeth twice a day and floss once a day. Good oral hygiene prevents tooth decay and gum disease.  The frequency of eye exams is based on your age, health, family medical history, use of contact lenses, and other factors. Follow your health care provider's recommendations for frequency of eye exams.  Eat a healthy diet. Foods like vegetables, fruits, whole grains, low-fat dairy products, and lean protein foods contain the nutrients you need without too many calories. Decrease your intake of foods high in solid fats, added sugars, and salt. Eat the right amount of calories for you.Get information about a proper diet from your health care provider, if necessary.  Regular physical exercise is one of the most important things you can do for your health. Most adults should get at least 150 minutes of moderate-intensity exercise (any activity that increases your heart rate and causes you to sweat) each week. In addition, most adults need muscle-strengthening exercises on 2 or more days a week.  Maintain a healthy weight. The body mass index (BMI) is a screening tool to identify possible weight problems. It provides an estimate of body fat based on height and weight. Your health care provider can find your BMI and can help you achieve or maintain a healthy weight.For adults 20 years and older:  A BMI below 18.5 is considered underweight.  A BMI of 18.5 to 24.9 is normal.  A BMI of 25 to 29.9 is considered overweight.  A BMI of 30 and above is considered obese.  Maintain normal blood lipids and cholesterol levels by exercising and minimizing your intake of saturated fat. Eat a balanced diet with plenty of fruit and vegetables. Blood tests for lipids and cholesterol should begin at age 46  and be repeated every 5 years. If your lipid or cholesterol levels are high, you are over 50, or you are at high risk for heart disease, you may need your cholesterol levels checked more frequently.Ongoing high lipid and cholesterol levels should be treated with medicines if diet and exercise are not working.  If you smoke, find out from your health care provider how to quit. If you do not use tobacco, do not start.  Lung cancer screening is recommended for adults aged 28-80 years who are at high risk for developing lung cancer because of a history of smoking. A yearly low-dose CT scan of the lungs is recommended for people who have at least a 30-pack-year history of smoking and are a current smoker or have quit within the past 15 years. A pack year of smoking is smoking an average of 1 pack of cigarettes a day for 1 year (for example: 1 pack a day for 30 years or 2 packs a day for 15 years). Yearly screening should continue until the smoker has stopped smoking for at least 15 years. Yearly screening  should be stopped for people who develop a health problem that would prevent them from having lung cancer treatment.  If you are pregnant, do not drink alcohol. If you are breastfeeding, be very cautious about drinking alcohol. If you are not pregnant and choose to drink alcohol, do not have more than 1 drink per day. One drink is considered to be 12 ounces (355 mL) of beer, 5 ounces (148 mL) of wine, or 1.5 ounces (44 mL) of liquor.  Avoid use of street drugs. Do not share needles with anyone. Ask for help if you need support or instructions about stopping the use of drugs.  High blood pressure causes heart disease and increases the risk of stroke. Your blood pressure should be checked at least every 1 to 2 years. Ongoing high blood pressure should be treated with medicines if weight loss and exercise do not work.  If you are 75-68 years old, ask your health care provider if you should take aspirin to  prevent strokes.  Diabetes screening involves taking a blood sample to check your fasting blood sugar level. This should be done once every 3 years, after age 21, if you are within normal weight and without risk factors for diabetes. Testing should be considered at a younger age or be carried out more frequently if you are overweight and have at least 1 risk factor for diabetes.  Breast cancer screening is essential preventive care for women. You should practice "breast self-awareness." This means understanding the normal appearance and feel of your breasts and may include breast self-examination. Any changes detected, no matter how small, should be reported to a health care provider. Women in their 30s and 30s should have a clinical breast exam (CBE) by a health care provider as part of a regular health exam every 1 to 3 years. After age 32, women should have a CBE every year. Starting at age 67, women should consider having a mammogram (breast X-ray test) every year. Women who have a family history of breast cancer should talk to their health care provider about genetic screening. Women at a high risk of breast cancer should talk to their health care providers about having an MRI and a mammogram every year.  Breast cancer gene (BRCA)-related cancer risk assessment is recommended for women who have family members with BRCA-related cancers. BRCA-related cancers include breast, ovarian, tubal, and peritoneal cancers. Having family members with these cancers may be associated with an increased risk for harmful changes (mutations) in the breast cancer genes BRCA1 and BRCA2. Results of the assessment will determine the need for genetic counseling and BRCA1 and BRCA2 testing.  Routine pelvic exams to screen for cancer are no longer recommended for nonpregnant women who are considered low risk for cancer of the pelvic organs (ovaries, uterus, and vagina) and who do not have symptoms. Ask your health care provider  if a screening pelvic exam is right for you.  If you have had past treatment for cervical cancer or a condition that could lead to cancer, you need Pap tests and screening for cancer for at least 20 years after your treatment. If Pap tests have been discontinued, your risk factors (such as having a new sexual partner) need to be reassessed to determine if screening should be resumed. Some women have medical problems that increase the chance of getting cervical cancer. In these cases, your health care provider may recommend more frequent screening and Pap tests.  The HPV test is an additional test that  may be used for cervical cancer screening. The HPV test looks for the virus that can cause the cell changes on the cervix. The cells collected during the Pap test can be tested for HPV. The HPV test could be used to screen women aged 25 years and older, and should be used in women of any age who have unclear Pap test results. After the age of 55, women should have HPV testing at the same frequency as a Pap test.  Colorectal cancer can be detected and often prevented. Most routine colorectal cancer screening begins at the age of 26 years and continues through age 71 years. However, your health care provider may recommend screening at an earlier age if you have risk factors for colon cancer. On a yearly basis, your health care provider may provide home test kits to check for hidden blood in the stool. Use of a small camera at the end of a tube, to directly examine the colon (sigmoidoscopy or colonoscopy), can detect the earliest forms of colorectal cancer. Talk to your health care provider about this at age 64, when routine screening begins. Direct exam of the colon should be repeated every 5-10 years through age 9 years, unless early forms of pre-cancerous polyps or small growths are found.  People who are at an increased risk for hepatitis B should be screened for this virus. You are considered at high risk  for hepatitis B if:  You were born in a country where hepatitis B occurs often. Talk with your health care provider about which countries are considered high risk.  Your parents were born in a high-risk country and you have not received a shot to protect against hepatitis B (hepatitis B vaccine).  You have HIV or AIDS.  You use needles to inject street drugs.  You live with, or have sex with, someone who has hepatitis B.  You get hemodialysis treatment.  You take certain medicines for conditions like cancer, organ transplantation, and autoimmune conditions.  Hepatitis C blood testing is recommended for all people born from [birth year] through 1966 and any individual with known risks for hepatitis C.  Practice safe sex. Use condoms and avoid high-risk sexual practices to reduce the spread of sexually transmitted infections (STIs). STIs include gonorrhea, chlamydia, syphilis, trichomonas, herpes, HPV, and human immunodeficiency virus (HIV). Herpes, HIV, and HPV are viral illnesses that have no cure. They can result in disability, cancer, and death.  You should be screened for sexually transmitted illnesses (STIs) including gonorrhea and chlamydia if:  You are sexually active and are younger than 24 years.  You are older than 24 years and your health care provider tells you that you are at risk for this type of infection.  Your sexual activity has changed since you were last screened and you are at an increased risk for chlamydia or gonorrhea. Ask your health care provider if you are at risk.  If you are at risk of being infected with HIV, it is recommended that you take a prescription medicine daily to prevent HIV infection. This is called preexposure prophylaxis (PrEP). You are considered at risk if:  You are a heterosexual woman, are sexually active, and are at increased risk for HIV infection.  You take drugs by injection.  You are sexually active with a partner who has HIV.  Talk with  your health care provider about whether you are at high risk of being infected with HIV. If you choose to begin PrEP, you should first be  tested for HIV. You should then be tested every 3 months for as long as you are taking PrEP.  Osteoporosis is a disease in which the bones lose minerals and strength with aging. This can result in serious bone fractures or breaks. The risk of osteoporosis can be identified using a bone density scan. Women ages 52 years and over and women at risk for fractures or osteoporosis should discuss screening with their health care providers. Ask your health care provider whether you should take a calcium supplement or vitamin D to reduce the rate of osteoporosis.  Menopause can be associated with physical symptoms and risks. Hormone replacement therapy is available to decrease symptoms and risks. You should talk to your health care provider about whether hormone replacement therapy is right for you.  Use sunscreen. Apply sunscreen liberally and repeatedly throughout the day. You should seek shade when your shadow is shorter than you. Protect yourself by wearing long sleeves, pants, a wide-brimmed hat, and sunglasses year round, whenever you are outdoors.  Once a month, do a whole body skin exam, using a mirror to look at the skin on your back. Tell your health care provider of new moles, moles that have irregular borders, moles that are larger than a pencil eraser, or moles that have changed in shape or color.  Stay current with required vaccines (immunizations).  Influenza vaccine. All adults should be immunized every year.  Tetanus, diphtheria, and acellular pertussis (Td, Tdap) vaccine. Pregnant women should receive 1 dose of Tdap vaccine during each pregnancy. The dose should be obtained regardless of the length of time since the last dose. Immunization is preferred during the 27th-36th week of gestation. An adult who has not previously received Tdap or who does not know  her vaccine status should receive 1 dose of Tdap. This initial dose should be followed by tetanus and diphtheria toxoids (Td) booster doses every 10 years. Adults with an unknown or incomplete history of completing a 3-dose immunization series with Td-containing vaccines should begin or complete a primary immunization series including a Tdap dose. Adults should receive a Td booster every 10 years.  Varicella vaccine. An adult without evidence of immunity to varicella should receive 2 doses or a second dose if she has previously received 1 dose. Pregnant females who do not have evidence of immunity should receive the first dose after pregnancy. This first dose should be obtained before leaving the health care facility. The second dose should be obtained 4-8 weeks after the first dose.  Human papillomavirus (HPV) vaccine. Females aged 13-26 years who have not received the vaccine previously should obtain the 3-dose series. The vaccine is not recommended for use in pregnant females. However, pregnancy testing is not needed before receiving a dose. If a female is found to be pregnant after receiving a dose, no treatment is needed. In that case, the remaining doses should be delayed until after the pregnancy. Immunization is recommended for any person with an immunocompromised condition through the age of 21 years if she did not get any or all doses earlier. During the 3-dose series, the second dose should be obtained 4-8 weeks after the first dose. The third dose should be obtained 24 weeks after the first dose and 16 weeks after the second dose.  Zoster vaccine. One dose is recommended for adults aged 16 years or older unless certain conditions are present.  Measles, mumps, and rubella (MMR) vaccine. Adults born before [birth year] generally are considered immune to measles and mumps.  Adults born in [birth year] or later should have 1 or more doses of MMR vaccine unless there is a contraindication to the vaccine or there is  laboratory evidence of immunity to each of the three diseases. A routine second dose of MMR vaccine should be obtained at least 28 days after the first dose for students attending postsecondary schools, health care workers, or international travelers. People who received inactivated measles vaccine or an unknown type of measles vaccine during 1963-1967 should receive 2 doses of MMR vaccine. People who received inactivated mumps vaccine or an unknown type of mumps vaccine before 1979 and are at high risk for mumps infection should consider immunization with 2 doses of MMR vaccine. For females of childbearing age, rubella immunity should be determined. If there is no evidence of immunity, females who are not pregnant should be vaccinated. If there is no evidence of immunity, females who are pregnant should delay immunization until after pregnancy. Unvaccinated health care workers born before [birth year] who lack laboratory evidence of measles, mumps, or rubella immunity or laboratory confirmation of disease should consider measles and mumps immunization with 2 doses of MMR vaccine or rubella immunization with 1 dose of MMR vaccine.  Pneumococcal 13-valent conjugate (PCV13) vaccine. When indicated, a person who is uncertain of her immunization history and has no record of immunization should receive the PCV13 vaccine. An adult aged 38 years or older who has certain medical conditions and has not been previously immunized should receive 1 dose of PCV13 vaccine. This PCV13 should be followed with a dose of pneumococcal polysaccharide (PPSV23) vaccine. The PPSV23 vaccine dose should be obtained at least 8 weeks after the dose of PCV13 vaccine. An adult aged 33 years or older who has certain medical conditions and previously received 1 or more doses of PPSV23 vaccine should receive 1 dose of PCV13. The PCV13 vaccine dose should be obtained 1 or more years after the last PPSV23 vaccine dose.  Pneumococcal polysaccharide  (PPSV23) vaccine. When PCV13 is also indicated, PCV13 should be obtained first. All adults aged 12 years and older should be immunized. An adult younger than age 67 years who has certain medical conditions should be immunized. Any person who resides in a nursing home or long-term care facility should be immunized. An adult smoker should be immunized. People with an immunocompromised condition and certain other conditions should receive both PCV13 and PPSV23 vaccines. People with human immunodeficiency virus (HIV) infection should be immunized as soon as possible after diagnosis. Immunization during chemotherapy or radiation therapy should be avoided. Routine use of PPSV23 vaccine is not recommended for American Indians, [place] Natives, or people younger than 65 years unless there are medical conditions that require PPSV23 vaccine. When indicated, people who have unknown immunization and have no record of immunization should receive PPSV23 vaccine. One-time revaccination 5 years after the first dose of PPSV23 is recommended for people aged 19-64 years who have chronic kidney failure, nephrotic syndrome, asplenia, or immunocompromised conditions. People who received 1-2 doses of PPSV23 before age 110 years should receive another dose of PPSV23 vaccine at age 33 years or later if at least 5 years have passed since the previous dose. Doses of PPSV23 are not needed for people immunized with PPSV23 at or after age 58 years.  Meningococcal vaccine. Adults with asplenia or persistent complement component deficiencies should receive 2 doses of quadrivalent meningococcal conjugate (MenACWY-D) vaccine. The doses should be obtained at least 2 months apart. Microbiologists working with certain meningococcal bacteria, [employer] recruits,  people at risk during an outbreak, and people who travel to or live in countries with a high rate of meningitis should be immunized. A first-year college student up through age 75 years who is  living in a residence hall should receive a dose if she did not receive a dose on or after her 16th birthday. Adults who have certain high-risk conditions should receive one or more doses of vaccine.  Hepatitis A vaccine. Adults who wish to be protected from this disease, have certain high-risk conditions, work with hepatitis A-infected animals, work in hepatitis A research labs, or travel to or work in countries with a high rate of hepatitis A should be immunized. Adults who were previously unvaccinated and who anticipate close contact with an international adoptee during the first 60 days after arrival in the [place] from a country with a high rate of hepatitis A should be immunized.  Hepatitis B vaccine. Adults who wish to be protected from this disease, have certain high-risk conditions, may be exposed to blood or other infectious body fluids, are household contacts or sex partners of hepatitis B positive people, are clients or workers in certain care facilities, or travel to or work in countries with a high rate of hepatitis B should be immunized.  Haemophilus influenzae type b (Hib) vaccine. A previously unvaccinated person with asplenia or sickle cell disease or having a scheduled splenectomy should receive 1 dose of Hib vaccine. Regardless of previous immunization, a recipient of a hematopoietic stem cell transplant should receive a 3-dose series 6-12 months after her successful transplant. Hib vaccine is not recommended for adults with HIV infection. Preventive Services / Frequency Ages 35 to 70 years  Blood pressure check.** / Every 1 to 2 years.  Lipid and cholesterol check.** / Every 5 years beginning at age 17.  Clinical breast exam.** / Every 3 years for women in their 67s and 18s.  BRCA-related cancer risk assessment.** / For women who have family members with a BRCA-related cancer (breast, ovarian, tubal, or peritoneal cancers).  Pap test.** / Every 2 years from ages 60  through 21. Every 3 years starting at age 21 through age 70 or 29 with a history of 3 consecutive normal Pap tests.  HPV screening.** / Every 3 years from ages 22 through ages 70 to 50 with a history of 3 consecutive normal Pap tests.  Hepatitis C blood test.** / For any individual with known risks for hepatitis C.  Skin self-exam. / Monthly.  Influenza vaccine. / Every year.  Tetanus, diphtheria, and acellular pertussis (Tdap, Td) vaccine.** / Consult your health care provider. Pregnant women should receive 1 dose of Tdap vaccine during each pregnancy. 1 dose of Td every 10 years.  Varicella vaccine.** / Consult your health care provider. Pregnant females who do not have evidence of immunity should receive the first dose after pregnancy.  HPV vaccine. / 3 doses over 6 months, if 1 and younger. The vaccine is not recommended for use in pregnant females. However, pregnancy testing is not needed before receiving a dose.  Measles, mumps, rubella (MMR) vaccine.** / You need at least 1 dose of MMR if you were born in 1957 or later. You may also need a 2nd dose. For females of childbearing age, rubella immunity should be determined. If there is no evidence of immunity, females who are not pregnant should be vaccinated. If there is no evidence of immunity, females who are pregnant should delay immunization until after pregnancy.  Pneumococcal 13-valent  conjugate (PCV13) vaccine.** / Consult your health care provider.  Pneumococcal polysaccharide (PPSV23) vaccine.** / 1 to 2 doses if you smoke cigarettes or if you have certain conditions.  Meningococcal vaccine.** / 1 dose if you are age 52 to 33 years and a Market researcher living in a residence hall, or have one of several medical conditions, you need to get vaccinated against meningococcal disease. You may also need additional booster doses.  Hepatitis A vaccine.** / Consult your health care provider.  Hepatitis B vaccine.** / Consult  your health care provider.  Haemophilus influenzae type b (Hib) vaccine.** / Consult your health care provider. Ages 54 to 72 years  Blood pressure check.** / Every 1 to 2 years.  Lipid and cholesterol check.** / Every 5 years beginning at age 18 years.  Lung cancer screening. / Every year if you are aged 52-80 years and have a 30-pack-year history of smoking and currently smoke or have quit within the past 15 years. Yearly screening is stopped once you have quit smoking for at least 15 years or develop a health problem that would prevent you from having lung cancer treatment.  Clinical breast exam.** / Every year after age 75 years.  BRCA-related cancer risk assessment.** / For women who have family members with a BRCA-related cancer (breast, ovarian, tubal, or peritoneal cancers).  Mammogram.** / Every year beginning at age 15 years and continuing for as long as you are in good health. Consult with your health care provider.  Pap test.** / Every 3 years starting at age 50 years through age 3 or 25 years with a history of 3 consecutive normal Pap tests.  HPV screening.** / Every 3 years from ages 61 years through ages 88 to 77 years with a history of 3 consecutive normal Pap tests.  Fecal occult blood test (FOBT) of stool. / Every year beginning at age 7 years and continuing until age 58 years. You may not need to do this test if you get a colonoscopy every 10 years.  Flexible sigmoidoscopy or colonoscopy.** / Every 5 years for a flexible sigmoidoscopy or every 10 years for a colonoscopy beginning at age 84 years and continuing until age 60 years.  Hepatitis C blood test.** / For all people born from [birth year] through 1966 and any individual with known risks for hepatitis C.  Skin self-exam. / Monthly.  Influenza vaccine. / Every year.  Tetanus, diphtheria, and acellular pertussis (Tdap/Td) vaccine.** / Consult your health care provider. Pregnant women should receive 1 dose of Tdap  vaccine during each pregnancy. 1 dose of Td every 10 years.  Varicella vaccine.** / Consult your health care provider. Pregnant females who do not have evidence of immunity should receive the first dose after pregnancy.  Zoster vaccine.** / 1 dose for adults aged 65 years or older.  Measles, mumps, rubella (MMR) vaccine.** / You need at least 1 dose of MMR if you were born in 1957 or later. You may also need a 2nd dose. For females of childbearing age, rubella immunity should be determined. If there is no evidence of immunity, females who are not pregnant should be vaccinated. If there is no evidence of immunity, females who are pregnant should delay immunization until after pregnancy.  Pneumococcal 13-valent conjugate (PCV13) vaccine.** / Consult your health care provider.  Pneumococcal polysaccharide (PPSV23) vaccine.** / 1 to 2 doses if you smoke cigarettes or if you have certain conditions.  Meningococcal vaccine.** / Consult your health care provider.  Hepatitis  A vaccine.** / Consult your health care provider.  Hepatitis B vaccine.** / Consult your health care provider.  Haemophilus influenzae type b (Hib) vaccine.** / Consult your health care provider. Ages 42 years and over  Blood pressure check.** / Every 1 to 2 years.  Lipid and cholesterol check.** / Every 5 years beginning at age 36 years.  Lung cancer screening. / Every year if you are aged 34-80 years and have a 30-pack-year history of smoking and currently smoke or have quit within the past 15 years. Yearly screening is stopped once you have quit smoking for at least 15 years or develop a health problem that would prevent you from having lung cancer treatment.  Clinical breast exam.** / Every year after age 60 years.  BRCA-related cancer risk assessment.** / For women who have family members with a BRCA-related cancer (breast, ovarian, tubal, or peritoneal cancers).  Mammogram.** / Every year beginning at age 30 years  and continuing for as long as you are in good health. Consult with your health care provider.  Pap test.** / Every 3 years starting at age 62 years through age 79 or 68 years with 3 consecutive normal Pap tests. Testing can be stopped between 65 and 70 years with 3 consecutive normal Pap tests and no abnormal Pap or HPV tests in the past 10 years.  HPV screening.** / Every 3 years from ages 52 years through ages 46 or 76 years with a history of 3 consecutive normal Pap tests. Testing can be stopped between 65 and 70 years with 3 consecutive normal Pap tests and no abnormal Pap or HPV tests in the past 10 years.  Fecal occult blood test (FOBT) of stool. / Every year beginning at age 23 years and continuing until age 38 years. You may not need to do this test if you get a colonoscopy every 10 years.  Flexible sigmoidoscopy or colonoscopy.** / Every 5 years for a flexible sigmoidoscopy or every 10 years for a colonoscopy beginning at age 89 years and continuing until age 82 years.  Hepatitis C blood test.** / For all people born from [birth year] through 1966 and any individual with known risks for hepatitis C.  Osteoporosis screening.** / A one-time screening for women ages 55 years and over and women at risk for fractures or osteoporosis.  Skin self-exam. / Monthly.  Influenza vaccine. / Every year.  Tetanus, diphtheria, and acellular pertussis (Tdap/Td) vaccine.** / 1 dose of Td every 10 years.  Varicella vaccine.** / Consult your health care provider.  Zoster vaccine.** / 1 dose for adults aged 60 years or older.  Pneumococcal 13-valent conjugate (PCV13) vaccine.** / Consult your health care provider.  Pneumococcal polysaccharide (PPSV23) vaccine.** / 1 dose for all adults aged 64 years and older.  Meningococcal vaccine.** / Consult your health care provider.  Hepatitis A vaccine.** / Consult your health care provider.  Hepatitis B vaccine.** / Consult your health care  provider.  Haemophilus influenzae type b (Hib) vaccine.** / Consult your health care provider. ** Family history and personal history of risk and conditions may change your health care provider's recommendations. Document Released: 04/06/2001 Document Revised: 06/25/2013 Document Reviewed: 07/06/2010 [facility] Patient Information 2015 [place], [place]. This information is not intended to replace advice given to you by your health care provider. Make sure you discuss any questions you have with your health care provider.   Please follow up with your doctor.  All your tests today were normal.  Please continue to take all  of your medications.
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
## 2014-07-18 NOTE — ED Notes (Signed)
Patient changed into scrubs, patient up to bathroom with minimal assistance.

## 2014-07-18 NOTE — ED Notes (Signed)
Patient ambulatory to toilet in room with one assist.

## 2014-07-18 NOTE — ED Notes (Signed)
ENVIRONMENTAL ASSESSMENT Potentially harmful objects out of patient reach: Yes.   Personal belongings secured: Yes.   Patient dressed in hospital provided attire only: Yes.   Plastic bags out of patient reach: Yes.   Patient care equipment (cords, cables, call bells, lines, and drains) shortened, removed, or accounted for: No. Patient not medically cleared, needs monitoring d/t respiratory issues.  Equipment and supplies removed from bottom of stretcher: Yes.   Potentially toxic materials out of patient reach: Yes.   Sharps container removed or out of patient reach: Yes.

## 2014-07-18 NOTE — ED Notes (Signed)
Pt gets upset when she thinks she has to go back home

## 2014-07-18 NOTE — ED Notes (Signed)
Patient called out for blanket, blanket provided to patient.

## 2014-07-18 NOTE — ED Notes (Signed)
Patient called out to desk requesting to use bathroom, patient assisted to bathroom. NAD noted.

## 2014-07-18 NOTE — ED Provider Notes (Signed)
Son is now in the emergency department. Says that the patient has become overwhelming to take care of at home. Think she needs placement. Left number of the home health nurse: [phone]. Does not have the nurses name. Says that social worker should likely contact nurse to obtain details of patient's condition. Discussed with the son that the cultures, as well as Education officer, museum has been consult at [facility] will likely see the patient tomorrow to further discuss patient's placement.  Orbie Pyo, MD 07/18/14 [phone]

## 2014-07-19 LAB — GLUCOSE, CAPILLARY
GLUCOSE-CAPILLARY: 328 mg/dL — AB (ref 65–99)
Glucose-Capillary: 227 mg/dL — ABNORMAL HIGH (ref 65–99)

## 2014-07-19 MED ORDER — ASPIRIN 81 MG PO CHEW
CHEWABLE_TABLET | ORAL | Status: AC
  Administered 2014-07-19: 81 mg
  Filled 2014-07-19: qty 1

## 2014-07-19 MED ORDER — MAGNESIUM HYDROXIDE 400 MG/5ML PO SUSP: 30.0000 mL | Freq: Every day | ORAL | Status: DC | PRN

## 2014-07-19 MED ORDER — ACETAMINOPHEN 325 MG PO TABS: 650.0000 mg | ORAL_TABLET | ORAL | Status: DC | PRN

## 2014-07-19 MED ORDER — SENNOSIDES-DOCUSATE SODIUM 8.6-50 MG PO TABS: 2.0000 | ORAL_TABLET | Freq: Every evening | ORAL | Status: DC | PRN

## 2014-07-19 MED ORDER — GLIPIZIDE 10 MG PO TABS
ORAL_TABLET | ORAL | Status: AC
  Filled 2014-07-19: qty 1

## 2014-07-19 MED ORDER — TIOTROPIUM BROMIDE MONOHYDRATE 18 MCG IN CAPS
18.0000 ug | ORAL_CAPSULE | Freq: Every day | RESPIRATORY_TRACT | Status: DC
  Administered 2014-07-19: 18 ug via RESPIRATORY_TRACT
  Filled 2014-07-19: qty 5

## 2014-07-19 MED ORDER — INSULIN GLARGINE 100 UNIT/ML ~~LOC~~ SOLN
15.0000 [IU] | Freq: Once | SUBCUTANEOUS | Status: AC
  Administered 2014-07-19: 15 [IU] via SUBCUTANEOUS
  Filled 2014-07-19: qty 0.15

## 2014-07-19 MED ORDER — IPRATROPIUM-ALBUTEROL 0.5-2.5 (3) MG/3ML IN SOLN
3.0000 mL | Freq: Once | RESPIRATORY_TRACT | Status: AC
  Administered 2014-07-19: 3 mL via RESPIRATORY_TRACT

## 2014-07-19 MED ORDER — ALBUTEROL SULFATE HFA 108 (90 BASE) MCG/ACT IN AERS: 2.0000 | INHALATION_SPRAY | Freq: Four times a day (QID) | RESPIRATORY_TRACT | Status: DC | PRN

## 2014-07-19 MED ORDER — ALBUTEROL SULFATE (2.5 MG/3ML) 0.083% IN NEBU: 2.5000 mg | INHALATION_SOLUTION | Freq: Four times a day (QID) | RESPIRATORY_TRACT | Status: DC | PRN

## 2014-07-19 MED ORDER — LORAZEPAM 0.5 MG PO TABS
ORAL_TABLET | ORAL | Status: DC
Start: 2014-07-19 — End: 2014-07-19
  Filled 2014-07-19: qty 1

## 2014-07-19 MED ORDER — ATORVASTATIN CALCIUM 20 MG PO TABS: 20.0000 mg | ORAL_TABLET | Freq: Every day | ORAL | Status: DC

## 2014-07-19 MED ORDER — HYDROCORTISONE 2.5 % RE CREA
TOPICAL_CREAM | Freq: Two times a day (BID) | RECTAL | Status: DC
  Filled 2014-07-19: qty 28.35

## 2014-07-19 MED ORDER — LORAZEPAM 0.5 MG PO TABS
0.5000 mg | ORAL_TABLET | Freq: Three times a day (TID) | ORAL | Status: DC
  Administered 2014-07-19: 0.5 mg via ORAL

## 2014-07-19 MED ORDER — MOMETASONE FURO-FORMOTEROL FUM 100-5 MCG/ACT IN AERO
2.0000 | INHALATION_SPRAY | Freq: Two times a day (BID) | RESPIRATORY_TRACT | Status: DC
Start: 2014-07-19 — End: 2014-07-19
  Filled 2014-07-19: qty 8.8

## 2014-07-19 MED ORDER — FUROSEMIDE 40 MG PO TABS
ORAL_TABLET | ORAL | Status: AC
  Filled 2014-07-19: qty 1

## 2014-07-19 MED ORDER — ASPIRIN EC 81 MG PO TBEC: 81.0000 mg | DELAYED_RELEASE_TABLET | Freq: Every day | ORAL | Status: DC

## 2014-07-19 MED ORDER — FUROSEMIDE 40 MG PO TABS
40.0000 mg | ORAL_TABLET | ORAL | Status: DC
  Administered 2014-07-19: 40 mg via ORAL

## 2014-07-19 MED ORDER — BISACODYL 10 MG RE SUPP: 10.0000 mg | Freq: Every day | RECTAL | Status: DC | PRN

## 2014-07-19 MED ORDER — HYDROCORTISONE 2.5 % RE CREA: TOPICAL_CREAM | Freq: Two times a day (BID) | RECTAL | Status: DC

## 2014-07-19 MED ORDER — SITAGLIPTIN PHOSPHATE 100 MG PO TABS
100.0000 mg | ORAL_TABLET | Freq: Every morning | ORAL | Status: DC
  Administered 2014-07-19: 100 mg via ORAL
  Filled 2014-07-19 (×2): qty 1

## 2014-07-19 MED ORDER — IPRATROPIUM-ALBUTEROL 0.5-2.5 (3) MG/3ML IN SOLN
RESPIRATORY_TRACT | Status: AC
  Administered 2014-07-19: 3 mL via RESPIRATORY_TRACT
  Filled 2014-07-19: qty 3

## 2014-07-19 MED ORDER — FLUPHENAZINE DECANOATE 25 MG/ML IJ SOLN: 50.0000 mg | INTRAMUSCULAR | Status: DC

## 2014-07-19 MED ORDER — INSULIN GLARGINE 100 UNIT/ML ~~LOC~~ SOLN: 15.0000 [IU] | Freq: Once | SUBCUTANEOUS | Status: DC

## 2014-07-19 MED ORDER — RANITIDINE HCL 150 MG/10ML PO SYRP
150.0000 mg | ORAL_SOLUTION | Freq: Two times a day (BID) | ORAL | Status: DC
  Filled 2014-07-19 (×3): qty 10

## 2014-07-19 MED ORDER — GLIPIZIDE 10 MG PO TABS
10.0000 mg | ORAL_TABLET | Freq: Two times a day (BID) | ORAL | Status: DC
  Administered 2014-07-19: 10 mg via ORAL

## 2014-07-19 MED ORDER — LISINOPRIL 5 MG PO TABS
5.0000 mg | ORAL_TABLET | Freq: Every day | ORAL | Status: DC
  Administered 2014-07-19: 5 mg via ORAL
  Filled 2014-07-19 (×2): qty 1

## 2014-07-19 MED ORDER — INSULIN GLARGINE 100 UNIT/ML ~~LOC~~ SOLN
15.0000 [IU] | Freq: Once | SUBCUTANEOUS | Status: DC
  Filled 2014-07-19: qty 0.15

## 2014-07-19 NOTE — ED Provider Notes (Signed)
-----------------------------------------   2:05 PM on 07/19/2014 -----------------------------------------  Social worker has been working with the patient today, and has arranged home health care for the patient. She is also discussed with the son the plan for discharge home back into his care with the aid of home health nursing, and they will work on placement on an outpatient basis. The son is agreeable to this plan, and we will discharge the patient home into his care.  Harvest Dark, MD 07/19/14 [phone]

## 2014-07-19 NOTE — ED Notes (Signed)
Patient resting in bed watching TV. No complaints at this time. NAD noted.

## 2014-07-19 NOTE — ED Notes (Signed)
BEHAVIORAL HEALTH ROUNDING Patient sleeping: Yes.   Patient alert and oriented: not applicable Behavior appropriate: Yes.  ; If no, describe:  Nutrition and fluids offered: No Toileting and hygiene offered: Yes  and No Sitter present: yes Law enforcement present: Yes   

## 2014-07-19 NOTE — ED Notes (Signed)
BEHAVIORAL HEALTH ROUNDING Patient sleeping: No. Patient alert and oriented: yes Behavior appropriate: Yes.  ; If no, describe:  Nutrition and fluids offered: Yes  Toileting and hygiene offered: Yes  Sitter present: yes Law enforcement present: Yes  

## 2014-07-19 NOTE — ED Notes (Signed)
Patient resting comfortably in bed, patient provided with warm blanket. no complaints at this time. Respirations even and unlabored, Will continue to monitor. NAD noted.

## 2014-07-19 NOTE — ED Notes (Signed)
Pt unable to sign at this time.

## 2014-07-19 NOTE — ED Notes (Signed)
BEHAVIORAL HEALTH ROUNDING Patient sleeping: Yes.   Patient alert and oriented: not applicable Behavior appropriate: Yes.  ; If no, describe:  Nutrition and fluids offered: No Toileting and hygiene offered: Yes  Sitter present: yes Law enforcement present: Yes   ENVIRONMENTAL ASSESSMENT Potentially harmful objects out of patient reach: Yes.   Personal belongings secured: Yes.   Patient dressed in hospital provided attire only: Yes.   Plastic bags out of patient reach: Yes.   Patient care equipment (cords, cables, call bells, lines, and drains) shortened, removed, or accounted for: Yes.   Equipment and supplies removed from bottom of stretcher: Yes.   Potentially toxic materials out of patient reach: Yes.   Sharps container removed or out of patient reach: Yes.

## 2014-07-19 NOTE — Progress Notes (Signed)
CSW informed that patient is medically ready to discharge, however son unable to take her home.  Call to patient's son, [name] [phone]  Stated he needs help placing patient as he can no longer handle her at home.  Wants patient admitted to hospital and discharged to a facility.  Informed son that patient has been evaluated by Psyc MD, she will not be admitted.   Son stated patient currently has a home Doctor, general practice.  Was unable to tell CSW when patient last saw her PCP.  Son ended call, stated will call CSW back.   Patient is currently followed by [facility] [phone] office or [phone] [facility].    CSW in to meet with patient, she is short of breath and often difficult to understand. However, patient inform CSW she lives with her son and he is trying to get her into a facility.  Patient seems open and willing to go. Says she came to [facility] due to having difficulty breathing.   She uses a walker to assist with her mobility.   Call to [place], [phone] with [facility], States she will follow up with patient tomorrow, will call son to inform him what time she will come to their home.  Also informed CSW patient will have PT and a CNA next week.  RN is currently working with son to get patient's Medicaid for long-term care.   Call to patient's son, provided him and update on the services [facility] will provide and that CSW will provide a copy of a blank FL2 for son to take to PCP to assist with placement process. Son is in agreement with taking patient home.  He will call CSW back to determine if he or EMS will transport patient home.   Casimer Lanius. Latanya Presser, MSW Clinical Social Work Department Emergency Room [phone] 11:40 AM

## 2014-07-19 NOTE — ED Notes (Signed)

## 2014-07-19 NOTE — ED Notes (Addendum)
Patient called out to desk stating to could not breath, patient on 2L nasal cannula, oxygen sat 98%. Patient also requesting more water and TV remote.

## 2014-07-19 NOTE — ED Notes (Signed)
Patient called out for water, water provided to patient.

## 2014-07-19 NOTE — ED Notes (Signed)
Patient called out stating she can't breath, patient on 2L oxygen nasal cannula, oxygen sat at 95%, patient speaking in complete sentences, respirations even and unlabored. Patient requesting breathing treatment. NAD noted.

## 2014-07-19 NOTE — Progress Notes (Addendum)
Received return call from patient's son, will have a family member open door so patient can get in.  Son has spoken to home health nurse and in agreement with all services in place.  Wants patient transported via EMS.   CSW provided MD with an update. And spoke to RN. Patient will discharge home with home health, CNA and PT.   CSW signing off.  No additional services needed at this time.   Casimer Lanius. Latanya Presser, [place] Work Department Emergency Room [phone] 2:11 PM

## 2014-07-19 NOTE — ED Provider Notes (Signed)
  8:15 AM on 07/19/2014 -----------------------------------------   BP 156/106 mmHg  Pulse 92  Temp(Src) 98.3 F (36.8 C) (Oral)  Resp 16  Ht 5\' 6"  (1.676 m)  Wt 287 lb (130.182 kg)  BMI 46.35 kg/m2  SpO2 98%  The patient had no acute events overnight. Patient has been seen and has been medically cleared for disposition, however due to social issues/living situation, we are currently awaiting social worker to evaluate the situation and aid in disposition of the patient.     Harvest Dark, MD 07/19/14 [phone]

## 2014-07-19 NOTE — ED Notes (Signed)
Patient called out and states "I have a scab on my right thigh and it itches." Patient has scab on right thigh, cleaned area with cool washcloth. Patient reports "feels better."

## 2014-07-19 NOTE — ED Notes (Signed)
BEHAVIORAL HEALTH ROUNDING Patient sleeping: Yes.   Patient alert and oriented: not applicable Behavior appropriate: Yes.  ; If no, describe:  Nutrition and fluids offered: No Toileting and hygiene offered: No Sitter present: yes Law enforcement present: Yes   

## 2014-07-19 NOTE — ED Notes (Signed)
Patient calling out to desk to get assistance to ambulate to toilet, patient assisted to toilet and back to bed.

## 2014-08-01 ENCOUNTER — Emergency Department
Admission: EM | Admit: 2014-08-01 | Discharge: 2014-08-02 | Disposition: A | Discharge status: Home / Self Care | Payer: Medicare HMO | Attending: Emergency Medicine | Admitting: Emergency Medicine

## 2014-08-01 ENCOUNTER — Other Ambulatory Visit: Payer: Self-pay

## 2014-08-01 DIAGNOSIS — Z87891 Personal history of nicotine dependence: Secondary | ICD-10-CM | POA: Insufficient documentation

## 2014-08-01 DIAGNOSIS — R0602 Shortness of breath: Secondary | ICD-10-CM

## 2014-08-01 DIAGNOSIS — E119 Type 2 diabetes mellitus without complications: Secondary | ICD-10-CM | POA: Insufficient documentation

## 2014-08-01 DIAGNOSIS — F419 Anxiety disorder, unspecified: Secondary | ICD-10-CM | POA: Diagnosis not present

## 2014-08-01 DIAGNOSIS — I10 Essential (primary) hypertension: Secondary | ICD-10-CM | POA: Diagnosis not present

## 2014-08-01 DIAGNOSIS — Z794 Long term (current) use of insulin: Secondary | ICD-10-CM | POA: Diagnosis not present

## 2014-08-01 DIAGNOSIS — Z7982 Long term (current) use of aspirin: Secondary | ICD-10-CM | POA: Insufficient documentation

## 2014-08-01 DIAGNOSIS — Z79899 Other long term (current) drug therapy: Secondary | ICD-10-CM | POA: Insufficient documentation

## 2014-08-01 NOTE — ED Notes (Signed)
Pt to ED via [facility] c/o Sh[facility] and anxiety. Pt presents with occasional snoring respirations. Pt is speaking clearly.

## 2014-08-02 ENCOUNTER — Emergency Department: Payer: Medicare HMO

## 2014-08-02 ENCOUNTER — Encounter: Payer: Self-pay | Admitting: Emergency Medicine

## 2014-08-02 ENCOUNTER — Other Ambulatory Visit: Payer: Self-pay

## 2014-08-02 LAB — BASIC METABOLIC PANEL
Anion gap: 9 (ref 5–15)
BUN: 11 mg/dL (ref 6–20)
CO2: 29 mmol/L (ref 22–32)
Calcium: 9.8 mg/dL (ref 8.9–10.3)
Chloride: 108 mmol/L (ref 101–111)
Creatinine, Ser: 1.14 mg/dL — ABNORMAL HIGH (ref 0.44–1.00)
GFR, EST AFRICAN AMERICAN: 56 mL/min — AB (ref >60)
GFR, EST NON AFRICAN AMERICAN: 48 mL/min — AB (ref >60)
Glucose, Bld: 116 mg/dL — ABNORMAL HIGH (ref 65–99)
Potassium: 3.7 mmol/L (ref 3.5–5.1)
Sodium: 146 mmol/L — ABNORMAL HIGH (ref 135–145)

## 2014-08-02 LAB — CBC
HCT: 39.6 % (ref 35.0–47.0)
Hemoglobin: 12.4 g/dL (ref 12.0–16.0)
MCH: 27.5 pg (ref 26.0–34.0)
MCHC: 31.3 g/dL — ABNORMAL LOW (ref 32.0–36.0)
MCV: 87.8 fL (ref 80.0–100.0)
Platelets: 262 10*3/uL (ref 150–440)
RBC: 4.51 MIL/uL (ref 3.80–5.20)
RDW: 14 % (ref 11.5–14.5)
WBC: 11.8 10*3/uL — ABNORMAL HIGH (ref 3.6–11.0)

## 2014-08-02 LAB — TROPONIN I: Troponin I: <0.03 ng/mL (ref <0.031)

## 2014-08-02 MED ORDER — IPRATROPIUM-ALBUTEROL 0.5-2.5 (3) MG/3ML IN SOLN
3.0000 mL | Freq: Once | RESPIRATORY_TRACT | Status: AC
  Administered 2014-08-02: 3 mL via RESPIRATORY_TRACT

## 2014-08-02 MED ORDER — IPRATROPIUM-ALBUTEROL 0.5-2.5 (3) MG/3ML IN SOLN
RESPIRATORY_TRACT | Status: AC
  Administered 2014-08-02: 3 mL via RESPIRATORY_TRACT
  Filled 2014-08-02: qty 3

## 2014-08-02 NOTE — ED Notes (Signed)
Patient with no complaints at this time. Skin warm/dry. Discharge instructions reviewed with patient at this time. Patient given opportunity to voice concerns/ask questions. Patient discharged at this time and left Emergency Department, via wheelchair and accompanied by pt son.

## 2014-08-02 NOTE — ED Provider Notes (Signed)
[facility] Emergency Department Provider Note  ____________________________________________  Time seen: Approximately 12:37 AM  I have reviewed the triage vital signs and the nursing notes.   HISTORY  Chief Complaint Shortness of Breath    HPI LATIYA NAVIA is a 69 y.o. female who presents via EMS with complaints of shortness of breath and anxiety. Patient has a history of COPD and schizophrenia with several ED visits last month for same. Patient states she is now on home oxygen with home health care visiting her several times per week. States she began to have progressive shortness of breath since this afternoon. Denies fever, chills, chest pain, abdominal pain, vomiting, diarrhea, headache, weakness, numbness, tingling.States her breasts have been itching.   Past Medical History  Diagnosis Date  . DM2 (diabetes mellitus, type 2)   . Hypertension   . Hypercholesteremia   . Schizophrenia   . Osteoarthritis   . Bursitis   . OSA (obstructive sleep apnea)     does not use machine  . Left ventricular outflow tract obstruction     a. echo 03/2014: EF 60-65%, hypernamic LV systolic fxn, mod LVH w/ LVOT gradient estimated at 68 mm Hg w/ valsalva, very small LV internal cavity size, mildly increased LV posterior wall thickness, mild Ao valve scl w/o stenosis, diastolic dysfunction, normal RVSP  . LVH (left ventricular hypertrophy)     a. echo suggests long standing uncontrolled htn. she will not do well when dehydrated, LV cavity obliteration  . Obesity   . CHF (congestive heart failure)   . Anxiety   . COPD (chronic obstructive pulmonary disease)     Patient Active Problem List   Diagnosis Date Noted  . Acute respiratory failure 06/29/2014  . Chronic diastolic CHF (congestive heart failure) 05/15/2014  . Obesity   . LVH (left ventricular hypertrophy)   . Left ventricular outflow tract obstruction   . Can't get food down 03/09/2013  . Acid reflux  03/09/2013  . History of colon polyps 03/09/2013  . Obstructive apnea 03/09/2013  . Absence of bladder continence 03/09/2013  . Renal mass 06/26/2011  . Lytic bone lesion of hip 06/25/2011  . Hypertension 06/24/2011  . Rectal bleeding 06/24/2011  . Schizophrenia 06/24/2011  . Diabetes mellitus 06/24/2011  . Hypokalemia 06/24/2011    Past Surgical History  Procedure Laterality Date  . Tonsillectomy    . Knee reconstruction, medial patellar femoral ligament    . Cholecystectomy    . Colonoscopy  04/2011    [facility] per patient incomplete    Current Outpatient Rx  Name  Route  Sig  Dispense  Refill  . acetaminophen (TYLENOL) 325 MG tablet   Oral   Take 650 mg by mouth every 4 (four) hours as needed for mild pain.          Marland Kitchen albuterol (PROVENTIL HFA;VENTOLIN HFA) 108 (90 BASE) MCG/ACT inhaler   Inhalation   Inhale 2 puffs into the lungs 4 (four) times daily as needed for wheezing or shortness of breath.         Marland Kitchen aspirin EC 81 MG tablet   Oral   Take 81 mg by mouth every morning.          Marland Kitchen atorvastatin (LIPITOR) 20 MG tablet   Oral   Take 20 mg by mouth at bedtime.          . bisacodyl (DULCOLAX) 10 MG suppository   Rectal   Place 10 mg rectally daily as needed for mild  constipation or moderate constipation.         . fluPHENAZine decanoate (PROLIXIN) 25 MG/ML injection   Intramuscular   Inject 50 mg into the muscle every 21 ( twenty-one) days.          . Fluticasone-Salmeterol (ADVAIR) 250-50 MCG/DOSE AEPB   Inhalation   Inhale 1 puff into the lungs 2 (two) times daily.         . furosemide (LASIX) 20 MG tablet   Oral   Take 40 mg by mouth daily. On Monday, Wednesday, and Friday         . glipiZIDE (GLUCOTROL) 10 MG tablet   Oral   Take 10 mg by mouth 2 (two) times daily.          . hydrocortisone 2.5 % cream   Topical   Apply 1 application topically 2 (two) times daily. Apply to left nipple and right thigh         . insulin glargine  (LANTUS) 100 UNIT/ML injection   Subcutaneous   Inject 0.3 mLs (30 Units total) into the skin at bedtime. Patient taking differently: Inject 15 Units into the skin at bedtime.    10 mL   2   . lisinopril (PRINIVIL,ZESTRIL) 5 MG tablet   Oral   Take 5 mg by mouth every morning.          Marland Kitchen LORazepam (ATIVAN) 0.5 MG tablet   Oral   Take 0.5 mg by mouth 3 (three) times daily.          . magnesium hydroxide (MILK OF MAGNESIA) 400 MG/5ML suspension   Oral   Take 30 mLs by mouth daily as needed for mild constipation.         . ranitidine (ZANTAC) 150 MG tablet   Oral   Take 150 mg by mouth 2 (two) times daily.          Marland Kitchen senna-docusate (SENOKOT-S) 8.6-50 MG per tablet   Oral   Take 2 tablets by mouth daily as needed for mild constipation or moderate constipation. May use 1-2 times daily as needed         . sitaGLIPtin (JANUVIA) 100 MG tablet   Oral   Take 100 mg by mouth every morning.          . tiotropium (SPIRIVA) 18 MCG inhalation capsule   Inhalation   Place 1 capsule (18 mcg total) into inhaler and inhale daily.   30 capsule   12     Allergies Haldol; Metformin; and Raspberry  Family History  Problem Relation Age of Onset  . Colon cancer Neg Hx   . Liver disease Neg Hx     Social History History  Substance Use Topics  . Smoking status: Former Research scientist (life sciences)  . Smokeless tobacco: Never Used  . Alcohol Use: No    Review of Systems Constitutional: No fever/chills Eyes: No visual changes. ENT: No sore throat. Cardiovascular: Denies chest pain. Respiratory: Positive for shortness of breath. Gastrointestinal: No abdominal pain.  No nausea, no vomiting.  No diarrhea.  No constipation. Genitourinary: Negative for dysuria. Musculoskeletal: Negative for back pain. Skin: Negative for rash. Neurological: Negative for headaches, focal weakness or numbness. Psychiatric:Positive for anxiety.  10-point ROS otherwise  negative.  ____________________________________________   PHYSICAL EXAM:  VITAL SIGNS: ED Triage Vitals  Enc Vitals Group     BP 08/01/14 2349 144/90 mmHg     Pulse Rate 08/01/14 2349 91     Resp 08/01/14 2349 20  Temp 08/01/14 2349 97.7 F (36.5 C)     Temp Source 08/01/14 2349 Oral     SpO2 08/01/14 2349 100 %     Weight 08/01/14 2349 287 lb (130.182 kg)     Height 08/01/14 2349 5\' 7"  (1.702 m)     Head Cir --      Peak Flow --      Pain Score 08/01/14 2355 0     Pain Loc --      Pain Edu? --      Excl. in [place]? --     Constitutional: Alert and oriented. Well appearing, mildly anxious. Eyes: Conjunctivae are normal. PERRL. EOMI. Head: Atraumatic. Nose: No congestion/rhinnorhea. Mouth/Throat: Mucous membranes are moist.  Oropharynx non-erythematous. Neck: No stridor.   Cardiovascular: Normal rate, regular rhythm. Grossly normal heart sounds.  Good peripheral circulation. Respiratory: Normal respiratory effort.  No retractions. Lungs CTAB. Gastrointestinal: Soft and nontender. No distention. No abdominal bruits. No CVA tenderness. Musculoskeletal: No lower extremity tenderness nor edema.  No joint effusions. Neurologic:  Normal speech and language. No gross focal neurologic deficits are appreciated. Speech is normal. Gait not tested. Skin:  Skin is warm, dry and intact. No rash noted. Psychiatric: Mood and affect are anxious. Speech and behavior are normal.  ____________________________________________   LABS (all labs ordered are listed, but only abnormal results are displayed)  Labs Reviewed  BASIC METABOLIC PANEL - Abnormal; Notable for the following:    Sodium 146 (*)    Glucose, Bld 116 (*)    Creatinine, Ser 1.14 (*)    GFR calc non Af Amer 48 (*)    GFR calc Af Amer 56 (*)    All other components within normal limits  CBC - Abnormal; Notable for the following:    WBC 11.8 (*)    MCHC 31.3 (*)    All other components within normal limits  TROPONIN I    ____________________________________________  EKG  ED ECG REPORT I, Lekeshia Kram J, the attending physician, personally viewed and interpreted this ECG.   Date: 08/02/2014  EKG Time: 0011  Rate: 85  Rhythm: normal EKG, normal sinus rhythm  Axis: Normal  Intervals:none  ST&T Change: Nonspecific  ____________________________________________  RADIOLOGY  Portable chest x-ray (viewed by me, interpreted by Dr. Alroy Dust): No active disease. ____________________________________________   PROCEDURES  Procedure(s) performed: None  Critical Care performed: No  ____________________________________________   INITIAL IMPRESSION / ASSESSMENT AND PLAN / ED COURSE  Pertinent labs & imaging results that were available during my care of the patient were reviewed by me and considered in my medical decision making (see chart for details).  69 year old female who arrives via EMS from home with complaints of anxiety and shortness of breath. Requesting nebulizer treatment despite clear lungs on auscultation. Will check basic labs, chest x-ray, nebulizer treatment. [name] came briefly to the ED and left before I could speak with him.  ----------------------------------------- 3:33 AM on 08/02/2014 -----------------------------------------  Patient felt better after nebulizer treatment. Labs and chest x-ray unremarkable. Patient had been calling for a ride home and finally got ahold of her son who will be picking patient up. Strict return precautions given. Patient verbalizes understanding and agrees with plan of care.  ----------------------------------------- 4:46 AM on 08/02/2014 -----------------------------------------  Son was not able to transport patient and instead requested EMS transport home. When EMS arrived at patient's house her son did not answer the door. EMS brought patient back to the ED. We have asked the police to check on  patient's house to ensure her son is there  before EMS transports her back.  0600  [name] subsequently arrived to transport patient home. Patient was subsequently discharged without further incident. ____________________________________________   FINAL CLINICAL IMPRESSION(S) / ED DIAGNOSES  Final diagnoses:  Anxiety  Shortness of breath    Paulette Blanch, MD 08/02/14 [phone]

## 2014-08-02 NOTE — ED Notes (Signed)
Spoke with [employer] on transportation difficulties occurring at pt son's residence home.

## 2014-08-02 NOTE — ED Notes (Signed)
Patient with no complaints at this time. Skin warm/dry. Discharge instructions reviewed with patient at this time. Patient given opportunity to voice concerns/ask questions. IV removed per policy and band-aid applied to site. Patient discharged at this time and left Emergency Department, via EMS.

## 2014-08-02 NOTE — ED Notes (Signed)
Received notification from [facility] stating pt son is on the way to pick up pt from ER medical facility.

## 2014-08-02 NOTE — Discharge Instructions (Signed)
1. Continue your medications as directed by your doctor. 2. Return to the ER for worsening symptoms, persistent vomiting, chest pain, difficulty breathing or other concerns.  Shortness of Breath Shortness of breath means you have trouble breathing. It could also mean that you have a medical problem. You should get immediate medical care for shortness of breath. CAUSES   Not enough oxygen in the air such as with high altitudes or a smoke-filled room.  Certain lung diseases, infections, or problems.  Heart disease or conditions, such as angina or heart failure.  Low red blood cells (anemia).  Poor physical fitness, which can cause shortness of breath when you exercise.  Chest or back injuries or stiffness.  Being overweight.  Smoking.  Anxiety, which can make you feel like you are not getting enough air. DIAGNOSIS  Serious medical problems can often be found during your physical exam. Tests may also be done to determine why you are having shortness of breath. Tests may include:  Chest X-rays.  Lung function tests.  Blood tests.  An electrocardiogram (ECG).  An ambulatory electrocardiogram. An ambulatory ECG records your heartbeat patterns over a 24-hour period.  Exercise testing.  A transthoracic echocardiogram (TTE). During echocardiography, sound waves are used to evaluate how blood flows through your heart.  A transesophageal echocardiogram (TEE).  Imaging scans. Your health care provider may not be able to find a cause for your shortness of breath after your exam. In this case, it is important to have a follow-up exam with your health care provider as directed.  TREATMENT  Treatment for shortness of breath depends on the cause of your symptoms and can vary greatly. HOME CARE INSTRUCTIONS   Do not smoke. Smoking is a common cause of shortness of breath. If you smoke, ask for help to quit.  Avoid being around chemicals or things that may bother your breathing, such  as paint fumes and dust.  Rest as needed. Slowly resume your usual activities.  If medicines were prescribed, take them as directed for the full length of time directed. This includes oxygen and any inhaled medicines.  Keep all follow-up appointments as directed by your health care provider. SEEK MEDICAL CARE IF:   Your condition does not improve in the time expected.  You have a hard time doing your normal activities even with rest.  You have any new symptoms. SEEK IMMEDIATE MEDICAL CARE IF:   Your shortness of breath gets worse.  You feel light-headed, faint, or develop a cough not controlled with medicines.  You start coughing up blood.  You have pain with breathing.  You have chest pain or pain in your arms, shoulders, or abdomen.  You have a fever.  You are unable to walk up stairs or exercise the way you normally do. MAKE SURE YOU:  Understand these instructions.  Will watch your condition.  Will get help right away if you are not doing well or get worse. Document Released: 11/03/2000 Document Revised: 02/13/2013 Document Reviewed: 04/26/2011 [employer] Patient Information 2015 [place], [place]. This information is not intended to replace advice given to you by your health care provider. Make sure you discuss any questions you have with your health care provider.

## 2014-08-09 ENCOUNTER — Encounter: Payer: Self-pay | Admitting: Emergency Medicine

## 2014-08-09 ENCOUNTER — Emergency Department
Admission: EM | Admit: 2014-08-09 | Discharge: 2014-08-12 | Disposition: A | Discharge status: Home / Self Care | Payer: Medicare HMO | Attending: Internal Medicine | Admitting: Internal Medicine

## 2014-08-09 ENCOUNTER — Emergency Department: Payer: Medicare HMO

## 2014-08-09 DIAGNOSIS — F209 Schizophrenia, unspecified: Secondary | ICD-10-CM | POA: Diagnosis not present

## 2014-08-09 DIAGNOSIS — Z79899 Other long term (current) drug therapy: Secondary | ICD-10-CM | POA: Diagnosis not present

## 2014-08-09 DIAGNOSIS — E119 Type 2 diabetes mellitus without complications: Secondary | ICD-10-CM | POA: Diagnosis not present

## 2014-08-09 DIAGNOSIS — Z7951 Long term (current) use of inhaled steroids: Secondary | ICD-10-CM | POA: Diagnosis not present

## 2014-08-09 DIAGNOSIS — Z7982 Long term (current) use of aspirin: Secondary | ICD-10-CM | POA: Insufficient documentation

## 2014-08-09 DIAGNOSIS — F203 Undifferentiated schizophrenia: Secondary | ICD-10-CM

## 2014-08-09 DIAGNOSIS — R531 Weakness: Secondary | ICD-10-CM | POA: Insufficient documentation

## 2014-08-09 DIAGNOSIS — I10 Essential (primary) hypertension: Secondary | ICD-10-CM | POA: Insufficient documentation

## 2014-08-09 DIAGNOSIS — Z87891 Personal history of nicotine dependence: Secondary | ICD-10-CM | POA: Insufficient documentation

## 2014-08-09 LAB — CBC
HCT: 38.9 % (ref 35.0–47.0)
HEMOGLOBIN: 12.2 g/dL (ref 12.0–16.0)
MCH: 27.8 pg (ref 26.0–34.0)
MCHC: 31.2 g/dL — ABNORMAL LOW (ref 32.0–36.0)
MCV: 89.2 fL (ref 80.0–100.0)
PLATELETS: 251 10*3/uL (ref 150–440)
RBC: 4.36 MIL/uL (ref 3.80–5.20)
RDW: 14.5 % (ref 11.5–14.5)
WBC: 10.3 10*3/uL (ref 3.6–11.0)

## 2014-08-09 LAB — BASIC METABOLIC PANEL
Anion gap: 10 (ref 5–15)
BUN: 12 mg/dL (ref 6–20)
CALCIUM: 9.4 mg/dL (ref 8.9–10.3)
CHLORIDE: 98 mmol/L — AB (ref 101–111)
CO2: 36 mmol/L — ABNORMAL HIGH (ref 22–32)
CREATININE: 0.92 mg/dL (ref 0.44–1.00)
GFR calc non Af Amer: >60 mL/min (ref >60)
Glucose, Bld: 100 mg/dL — ABNORMAL HIGH (ref 65–99)
Potassium: 4.2 mmol/L (ref 3.5–5.1)
Sodium: 144 mmol/L (ref 135–145)

## 2014-08-09 LAB — GLUCOSE, CAPILLARY
Glucose-Capillary: 74 mg/dL (ref 65–99)
Glucose-Capillary: 73 mg/dL (ref 65–99)

## 2014-08-09 LAB — BRAIN NATRIURETIC PEPTIDE: B Natriuretic Peptide: 47 pg/mL (ref 0.0–100.0)

## 2014-08-09 MED ORDER — ATORVASTATIN CALCIUM 20 MG PO TABS
20.0000 mg | ORAL_TABLET | Freq: Every day | ORAL | Status: DC
  Administered 2014-08-10 – 2014-08-11 (×2): 20 mg via ORAL

## 2014-08-09 MED ORDER — RISPERIDONE 1 MG PO TABS
0.5000 mg | ORAL_TABLET | Freq: Every morning | ORAL | Status: DC
  Administered 2014-08-10 – 2014-08-12 (×3): 0.5 mg via ORAL
  Filled 2014-08-09: qty 0.5

## 2014-08-09 MED ORDER — RISPERIDONE 1 MG PO TABS
ORAL_TABLET | ORAL | Status: AC
  Filled 2014-08-09: qty 1

## 2014-08-09 MED ORDER — LISINOPRIL 5 MG PO TABS
5.0000 mg | ORAL_TABLET | ORAL | Status: DC
  Administered 2014-08-10 – 2014-08-12 (×3): 5 mg via ORAL
  Filled 2014-08-09 (×5): qty 1

## 2014-08-09 MED ORDER — LORAZEPAM 2 MG/ML IJ SOLN
1.0000 mg | Freq: Once | INTRAMUSCULAR | Status: AC
  Administered 2014-08-09: 1 mg via INTRAVENOUS

## 2014-08-09 MED ORDER — FUROSEMIDE 40 MG PO TABS
40.0000 mg | ORAL_TABLET | Freq: Every day | ORAL | Status: DC
  Administered 2014-08-10 – 2014-08-12 (×3): 40 mg via ORAL

## 2014-08-09 MED ORDER — ASPIRIN EC 81 MG PO TBEC
81.0000 mg | DELAYED_RELEASE_TABLET | ORAL | Status: DC
  Administered 2014-08-11 – 2014-08-12 (×2): 81 mg via ORAL

## 2014-08-09 MED ORDER — ACETAMINOPHEN 325 MG PO TABS: 650.0000 mg | ORAL_TABLET | ORAL | Status: DC | PRN

## 2014-08-09 MED ORDER — LORAZEPAM 0.5 MG PO TABS
0.5000 mg | ORAL_TABLET | Freq: Three times a day (TID) | ORAL | Status: DC
Start: 2014-08-09 — End: 2014-08-12
  Administered 2014-08-10 – 2014-08-12 (×6): 0.5 mg via ORAL

## 2014-08-09 MED ORDER — GLIMEPIRIDE 1 MG PO TABS
2.0000 mg | ORAL_TABLET | Freq: Two times a day (BID) | ORAL | Status: DC
  Administered 2014-08-10 – 2014-08-12 (×5): 2 mg via ORAL
  Filled 2014-08-09: qty 2
  Filled 2014-08-09: qty 1
  Filled 2014-08-09 (×4): qty 2
  Filled 2014-08-09: qty 1

## 2014-08-09 MED ORDER — GLIPIZIDE 10 MG PO TABS
10.0000 mg | ORAL_TABLET | Freq: Two times a day (BID) | ORAL | Status: DC
  Administered 2014-08-10 – 2014-08-12 (×5): 10 mg via ORAL
  Filled 2014-08-09: qty 1

## 2014-08-09 MED ORDER — FAMOTIDINE 20 MG PO TABS
20.0000 mg | ORAL_TABLET | Freq: Every day | ORAL | Status: DC
  Administered 2014-08-10 – 2014-08-12 (×3): 20 mg via ORAL
  Filled 2014-08-09 (×2): qty 1

## 2014-08-09 MED ORDER — METOPROLOL SUCCINATE ER 50 MG PO TB24
50.0000 mg | ORAL_TABLET | Freq: Every day | ORAL | Status: DC
  Administered 2014-08-10 – 2014-08-12 (×2): 50 mg via ORAL
  Filled 2014-08-09: qty 1

## 2014-08-09 MED ORDER — POLYETHYLENE GLYCOL 3350 17 GM/SCOOP PO POWD
17.0000 g | Freq: Every day | ORAL | Status: DC | PRN
  Filled 2014-08-09: qty 255

## 2014-08-09 MED ORDER — ALBUTEROL SULFATE (2.5 MG/3ML) 0.083% IN NEBU: 3.0000 mL | INHALATION_SOLUTION | Freq: Four times a day (QID) | RESPIRATORY_TRACT | Status: DC | PRN

## 2014-08-09 MED ORDER — RISPERIDONE 1 MG PO TABS: 0.5000 mg | ORAL_TABLET | Freq: Two times a day (BID) | ORAL | Status: DC

## 2014-08-09 MED ORDER — TIOTROPIUM BROMIDE MONOHYDRATE 18 MCG IN CAPS
18.0000 ug | ORAL_CAPSULE | Freq: Every day | RESPIRATORY_TRACT | Status: DC
  Administered 2014-08-10 – 2014-08-12 (×3): 18 ug via RESPIRATORY_TRACT
  Filled 2014-08-09: qty 5

## 2014-08-09 MED ORDER — SPIRONOLACTONE 25 MG PO TABS
25.0000 mg | ORAL_TABLET | Freq: Every day | ORAL | Status: DC
  Administered 2014-08-10 – 2014-08-12 (×3): 25 mg via ORAL
  Filled 2014-08-09 (×4): qty 1

## 2014-08-09 MED ORDER — INSULIN GLARGINE 100 UNIT/ML ~~LOC~~ SOLN
15.0000 [IU] | Freq: Every day | SUBCUTANEOUS | Status: DC
  Administered 2014-08-10 – 2014-08-11 (×2): 15 [IU] via SUBCUTANEOUS
  Filled 2014-08-09 (×3): qty 0.15

## 2014-08-09 MED ORDER — LINAGLIPTIN 5 MG PO TABS
5.0000 mg | ORAL_TABLET | Freq: Every day | ORAL | Status: DC
  Administered 2014-08-10 – 2014-08-12 (×3): 5 mg via ORAL
  Filled 2014-08-09 (×4): qty 1

## 2014-08-09 MED ORDER — LORAZEPAM 2 MG/ML IJ SOLN
INTRAMUSCULAR | Status: AC
  Administered 2014-08-09: 1 mg via INTRAVENOUS
  Filled 2014-08-09: qty 1

## 2014-08-09 MED ORDER — CITALOPRAM HYDROBROMIDE 10 MG PO TABS
10.0000 mg | ORAL_TABLET | Freq: Every day | ORAL | Status: DC
  Administered 2014-08-10 – 2014-08-12 (×3): 10 mg via ORAL
  Filled 2014-08-09: qty 1
  Filled 2014-08-09: qty 0.5
  Filled 2014-08-09 (×2): qty 1

## 2014-08-09 MED ORDER — MOMETASONE FURO-FORMOTEROL FUM 100-5 MCG/ACT IN AERO
2.0000 | INHALATION_SPRAY | Freq: Two times a day (BID) | RESPIRATORY_TRACT | Status: DC
  Administered 2014-08-10 – 2014-08-12 (×5): 2 via RESPIRATORY_TRACT
  Filled 2014-08-09: qty 8.8

## 2014-08-09 NOTE — ED Provider Notes (Signed)
[facility] Emergency Department Provider Note  ____________________________________________  Time seen: On arrival  I have reviewed the triage vital signs and the nursing notes.   HISTORY  Chief Complaint Weakness    HPI [name] [name] is a 69 y.o. female was brought in by her son who states that social services told him to bring her here to have [place] fill out paperwork for her to go to a nursing home. The son reports there is been no distinct change in her condition and she has not had any physical complaints. Of note, patient with prolonged stay in the emergency department earlier in the month and was eventually discharged to sons home with home health. He reports she can no longer stay there and he cannot take care of her. He states he has been in contact with [facility] and they have a bed for her. Patient denies any complaints. She has no shortness of breath. No fevers no chills. She has chronically swollen legs and is supposed to use CPAP at night but refuses     Past Medical History  Diagnosis Date  . DM2 (diabetes mellitus, type 2)   . Hypertension   . Hypercholesteremia   . Schizophrenia   . Osteoarthritis   . Bursitis   . OSA (obstructive sleep apnea)     does not use machine  . Left ventricular outflow tract obstruction     a. echo 03/2014: EF 60-65%, hypernamic LV systolic fxn, mod LVH w/ LVOT gradient estimated at 68 mm Hg w/ valsalva, very small LV internal cavity size, mildly increased LV posterior wall thickness, mild Ao valve scl w/o stenosis, diastolic dysfunction, normal RVSP  . LVH (left ventricular hypertrophy)     a. echo suggests long standing uncontrolled htn. she will not do well when dehydrated, LV cavity obliteration  . Obesity   . CHF (congestive heart failure)   . Anxiety   . COPD (chronic obstructive pulmonary disease)     Patient Active Problem List   Diagnosis Date Noted  . Acute respiratory failure 06/29/2014  .  Chronic diastolic CHF (congestive heart failure) 05/15/2014  . Obesity   . LVH (left ventricular hypertrophy)   . Left ventricular outflow tract obstruction   . Can't get food down 03/09/2013  . Acid reflux 03/09/2013  . History of colon polyps 03/09/2013  . Obstructive apnea 03/09/2013  . Absence of bladder continence 03/09/2013  . Renal mass 06/26/2011  . Lytic bone lesion of hip 06/25/2011  . Hypertension 06/24/2011  . Rectal bleeding 06/24/2011  . Schizophrenia 06/24/2011  . Diabetes mellitus 06/24/2011  . Hypokalemia 06/24/2011    Past Surgical History  Procedure Laterality Date  . Tonsillectomy    . Knee reconstruction, medial patellar femoral ligament    . Cholecystectomy    . Colonoscopy  04/2011    [facility] per patient incomplete    Current Outpatient Rx  Name  Route  Sig  Dispense  Refill  . albuterol (PROVENTIL HFA;VENTOLIN HFA) 108 (90 BASE) MCG/ACT inhaler   Inhalation   Inhale 2 puffs into the lungs 4 (four) times daily as needed for wheezing or shortness of breath.         Marland Kitchen aspirin EC 81 MG tablet   Oral   Take 81 mg by mouth every morning.          Marland Kitchen atorvastatin (LIPITOR) 20 MG tablet   Oral   Take 20 mg by mouth at bedtime.          Marland Kitchen  citalopram (CELEXA) 10 MG tablet   Oral   Take 1 tablet by mouth daily.         . furosemide (LASIX) 20 MG tablet   Oral   Take 40 mg by mouth daily. On Monday, Wednesday, and Friday         . glimepiride (AMARYL) 2 MG tablet   Oral   Take 1 tablet by mouth 2 (two) times daily.         . insulin glargine (LANTUS) 100 UNIT/ML injection   Subcutaneous   Inject 0.3 mLs (30 Units total) into the skin at bedtime. Patient taking differently: Inject 15 Units into the skin at bedtime.    10 mL   2   . ketoconazole (NIZORAL) 2 % cream   Topical   Apply 1 application topically 2 (two) times daily.         Marland Kitchen lisinopril (PRINIVIL,ZESTRIL) 5 MG tablet   Oral   Take 5 mg by mouth every morning.           . metoprolol succinate (TOPROL-XL) 50 MG 24 hr tablet   Oral   Take 1 tablet by mouth daily.         . risperiDONE (RISPERDAL) 0.5 MG tablet   Oral   Take 1 tablet by mouth every morning. Take 1 tablet in the morning and take 2 tablets in the evening.         . sitaGLIPtin (JANUVIA) 100 MG tablet   Oral   Take 100 mg by mouth every morning.          Marland Kitchen acetaminophen (TYLENOL) 325 MG tablet   Oral   Take 650 mg by mouth every 4 (four) hours as needed for mild pain.          Jeralyn Ruths 80 MCG/ACT AERS   Oral   Take 1 puff by mouth 2 (two) times daily.           Dispense as written.   . bisacodyl (DULCOLAX) 10 MG suppository   Rectal   Place 10 mg rectally daily as needed for mild constipation or moderate constipation.         Marland Kitchen FLOVENT HFA 220 MCG/ACT inhaler   Oral   Take 5 puffs by mouth 2 (two) times daily.           Dispense as written.   . fluPHENAZine decanoate (PROLIXIN) 25 MG/ML injection   Intramuscular   Inject 50 mg into the muscle every 21 ( twenty-one) days.          . Fluticasone-Salmeterol (ADVAIR) 250-50 MCG/DOSE AEPB   Inhalation   Inhale 1 puff into the lungs 2 (two) times daily.         Marland Kitchen glipiZIDE (GLUCOTROL) 10 MG tablet   Oral   Take 10 mg by mouth 2 (two) times daily.          . hydrocortisone 2.5 % cream   Topical   Apply 1 application topically 2 (two) times daily. Apply to left nipple and right thigh         . LORazepam (ATIVAN) 0.5 MG tablet   Oral   Take 0.5 mg by mouth 3 (three) times daily.          . magnesium hydroxide (MILK OF MAGNESIA) 400 MG/5ML suspension   Oral   Take 30 mLs by mouth daily as needed for mild constipation.         . polyethylene glycol powder (GLYCOLAX/MIRALAX)  powder   Oral   Take 17 g by mouth daily as needed.         . ranitidine (ZANTAC) 150 MG tablet   Oral   Take 150 mg by mouth 2 (two) times daily.          Marland Kitchen senna-docusate (SENOKOT-S) 8.6-50 MG per tablet   Oral    Take 2 tablets by mouth daily as needed for mild constipation or moderate constipation. May use 1-2 times daily as needed         . spironolactone (ALDACTONE) 25 MG tablet   Oral   Take 1 tablet by mouth daily.         Marland Kitchen tiotropium (SPIRIVA) 18 MCG inhalation capsule   Inhalation   Place 1 capsule (18 mcg total) into inhaler and inhale daily. Patient not taking: Reported on 12-03-2014   30 capsule   12     Allergies Haldol; Metformin; and Raspberry  Family History  Problem Relation Age of Onset  . Colon cancer Neg Hx   . Liver disease Neg Hx     Social History History  Substance Use Topics  . Smoking status: Former Research scientist (life sciences)  . Smokeless tobacco: Never Used  . Alcohol Use: No    Review of Systems  Constitutional: Negative for fever. Eyes: Negative for visual changes. ENT: Negative for sore throat Cardiovascular: Negative for chest pain. Respiratory: Negative for shortness of breath. Gastrointestinal: Negative for abdominal pain, vomiting and diarrhea. Genitourinary: Negative for dysuria. Musculoskeletal: Negative for back pain. Skin: Negative for rash. Neurological: Negative for headaches or focal weakness Psychiatric: No anxiety  10-point ROS otherwise negative. Patient denies all complaints  ____________________________________________   PHYSICAL EXAM:  VITAL SIGNS: ED Triage Vitals  Enc Vitals Group     BP 08/09/14 0920 117/74 mmHg     Pulse Rate 08/09/14 0920 65     Resp 08/09/14 0920 18     Temp 08/09/14 0920 98.5 F (36.9 C)     Temp Source 08/09/14 0920 Oral     SpO2 08/09/14 0943 99 %     Weight 08/09/14 0920 289 lb (131.09 kg)     Height 08/09/14 0920 5\' 6"  (1.676 m)     Head Cir --      Peak Flow --      Pain Score 08/09/14 0920 8     Pain Loc --      Pain Edu? --      Excl. in [place]? --      Constitutional: Alert and oriented. Well appearing and in no distress. Positive for obesity Eyes: Conjunctivae are normal. PERRL. ENT    Head: Normocephalic and atraumatic.   Nose: No rhinnorhea.   Mouth/Throat: Mucous membranes are moist. Cardiovascular: Normal rate, regular rhythm. Normal and symmetric distal pulses are present in all extremities. No murmurs, rubs, or gallops. Respiratory: Normal respiratory effort without tachypnea nor retractions. Breath sounds are clear and equal bilaterally.  Gastrointestinal: Soft and non-tender in all quadrants. No distention. There is no CVA tenderness. Genitourinary: deferred Musculoskeletal: Nontender with normal range of motion in all extremities. No lower extremity tenderness. 2+ bilateral edema Neurologic:  Normal speech and language. No gross focal neurologic deficits are appreciated. Skin:  Skin is warm, dry and intact. No rash noted. Psychiatric: Mood and affect are normal. Patient exhibits appropriate insight and judgment.  ____________________________________________    LABS (pertinent positives/negatives)  Labs Reviewed  BASIC METABOLIC PANEL - Abnormal; Notable for the following:    Chloride  98 (*)    CO2 36 (*)    Glucose, Bld 100 (*)    All other components within normal limits  CBC - Abnormal; Notable for the following:    MCHC 31.2 (*)    All other components within normal limits  GLUCOSE, CAPILLARY  BRAIN NATRIURETIC PEPTIDE  CBG MONITORING, ED    ____________________________________________   EKG  ED ECG REPORT I, Lavonia Drafts, the attending physician, personally viewed and interpreted this ECG.   Date: 08/06/2014  EKG Time: 9:44 AM  Rate: 64  Rhythm: normal sinus rhythm  Axis: No acute distress  Intervals:none  ST&T Change: T wave changes in lateral leads   ____________________________________________    RADIOLOGY  No acute distress on chest x-ray, reviewed by me  ____________________________________________   PROCEDURES  Procedure(s) performed: none  Critical Care performed:  none  ____________________________________________   INITIAL IMPRESSION / ASSESSMENT AND PLAN / ED COURSE  Pertinent labs & imaging results that were available during my care of the patient were reviewed by me and considered in my medical decision making (see chart for details).  Patient has no physical complaints at this time. Workup negative. Social work consulted and has been working to find placement for the patient. Of note patient is on 2 L nasal cannula at baseline and is supposed to use CPAP at night but refuses to do so.  ____________________________________________   FINAL CLINICAL IMPRESSION(S) / ED DIAGNOSES  Final diagnoses:  Weakness generalized     Lavonia Drafts, MD 08/09/14 [phone]

## 2014-08-09 NOTE — Discharge Instructions (Signed)

## 2014-08-09 NOTE — ED Provider Notes (Signed)
This patient has been brought to the emergency department by her son due to difficulties at home and feeling that he is unable to care for for her. Please see Dr. Denzil Hughes H&P for details.  Dr. Corky Downs had consult with social work. I discussed the case with them. The patient had a "PASR" that has expired. This is a level II "PASR" and apparently without this she is not able to be placed for further care. Social worker is working on obtaining a new "PASR".  With the history of schizophrenia and the difficult to the patient is having, we have consult to psychiatry. Social work will also asked the team to come into the patient here in the emergency department. We will continue to seek proper placement and appropriate care for this patient.  ----------------------------------------- 7:23 PM on Jan 03, 202016 -----------------------------------------  The patient became somewhat loud and agitated earlier. We treated her with Ativan and with Risperdal. We have reviewed her medications at this time and are emptying to continue her on her usual management.   Psych consult is pending. Ongoing Education officer, museum support is needed.   Ahmed Prima, MD 08/09/14 [phone]

## 2014-08-09 NOTE — ED Notes (Signed)
Patient refusing risperidone at this time. Patient states she's allergic to risperidone; states it makes her legs swell and her breast leak". States, "That must be what's wrong with me... They've been giving me the wrong medicine... That's why my boob is leaking". No drainage from breast noted at this time.

## 2014-08-09 NOTE — Clinical Social Work Note (Signed)
Clinical Social Work Assessment  Patient Details  Name: [name] [name] MRN: [id number] Date of Birth: 30-Nov-1945  Date of referral:  08/09/14               Reason for consult:  Mental Health Concerns, Facility Placement                Permission sought to share information with:  Facility Sport and exercise psychologist, Family Supports Permission granted to share information::  Yes, Verbal Permission Granted  Name::        Agency::     Relationship::     Contact Information:  [name] [name]  [phone]  Housing/Transportation Living arrangements for the past 2 months:  [place] of Information:  Patient, Adult Children Patient Interpreter Needed:  None Criminal Activity/Legal Involvement Pertinent to Current Situation/Hospitalization:  No - Comment as needed Significant Relationships:  Adult Children Lives with:  Adult Children Do you feel safe going back to the place where you live?  No (Patient is paranoid and thinks her son dealing drugs.) Need for family participation in patient care:  Yes (Comment)  Care giving concerns:  Son is concern for patient to go home due to physical and psych concerns   Social Worker assessment / plan:  [name] [name] is a 69 y.o. female was brought in by her son.  He reports she can no longer stay there and he cannot take care of her. He states he has been in contact with [facility],  and they have a bed for her however patient does not have a PASSR number, will take 5 to 7 days to obtain number.  CSW has submitting information for a review.   Willing to go Skilled facility.   Son is concern with patient going home due to limitation with walking, swelling in her legs and paranoia.   During assessment patient presents guarded.  Denies SI, HI,  Patient to be evaluated by Psych MD.     Employment status:  Disabled (Comment on whether or not currently receiving Disability) Insurance information:  Medicaid [place] PT Recommendations:   [place] / Referral to community resources:  Inpatient Psychiatric Care (Comment Required), [place]  Patient/Family's Response to care:  Family would like patient to obtain help so that she can become stable.  Patient/Family's Understanding of and Emotional Response to Diagnosis, Current Treatment, and Prognosis:  Patient does not want to return home, willing to go to facility.   Emotional Assessment Appearance:  Appears older than stated age Attitude/Demeanor/Rapport:  Guarded, Suspicious, Paranoid Affect (typically observed):  Guarded, Calm, Pleasant, Flat Orientation:  Oriented to Self, Oriented to Place, Oriented to  Time, Oriented to Situation Alcohol / Substance use:  Not Applicable Psych involvement (Current and /or in the community):  Outpatient Provider ([facility] ACT team  [phone])  Discharge Needs  Concerns to be addressed:  Compliance Issues Concerns, Home Safety Concerns, Mental Health Concerns Readmission within the last 30 days:  Yes Current discharge risk:  Psychiatric Illness, Physical Impairment Barriers to Discharge:  Psych Bed not available, Awaiting State Approval ([place])   Maurine Cane, LCSW 08-25-202016, 5:07 PM

## 2014-08-09 NOTE — Consult Note (Signed)
[facility] Face-to-Face Psychiatry Consult   Reason for Consult:  Consult for this 69 year old woman with a history of schizophrenia who was brought into the hospital by her son Referring Physician:  Thomasene Lot Patient Identification: [name] [name] MRN:  [id number] Principal Diagnosis: Schizophrenia Diagnosis:   Patient Active Problem List   Diagnosis Date Noted  . Acute respiratory failure [J96.00] 06/29/2014  . Chronic diastolic CHF (congestive heart failure) [I50.32] 05/15/2014  . Obesity [E66.9]   . LVH (left ventricular hypertrophy) [I51.7]   . 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 Can't get food down [R13.10] 03/09/2013  . Acid reflux [K21.9] 03/09/2013  . History of colon polyps [Z86.010] 03/09/2013  . Obstructive apnea [G47.33] 03/09/2013  . Absence of bladder continence [R32] 03/09/2013  . Renal mass [N28.89] 06/26/2011  . 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 Hypertension [I10] 06/24/2011  . Rectal bleeding [K62.5] 06/24/2011  . Schizophrenia [F20.9] 06/24/2011  . Diabetes mellitus [E11.9] 06/24/2011  . Hypokalemia [E87.6] 06/24/2011    Total Time spent with patient: 45 minutes  Subjective:   [name] [name] is a 69 y.o. female patient admitted with "I'm doing fine but my legs are hurting".  HPI:  Information from the patient and the chart. Patient well known from multiple prior encounters. Patient was brought to the hospital by her son with a report that she needs "medical clearance" in order to be admitted to [facility] care. There is no other more acute complaint. The patient herself complains that her right leg is swollen but it's not clear that this is any different than usual. Mentally she denies being depressed denies suicidal ideation and denies homicidal ideation. She still believes that there are drug dealers that her menacing her at the place that she's been living but she doesn't get too worked up about it. She has been compliant with  outpatient medication including her Prolixin Decanoate shot  Past psychiatric history: Long history of schizophrenia multiple hospitalizations including lengthy hospital stays here and at state facilities. She has in the past tended to be medicine noncompliant and difficult to control. Doesn't tend to become suicidal but does become agitated. Currently she is being maintained better because she is getting a Prolixin shot and hasn't act team.  Social history: Son has now I think obtained guardianship. She's been living with her son but apparently it's not a viable situation long-term and he is getting her into [facility]  Medical history: Multiple medical problems including COPD diabetes congestive heart failure high blood pressure  Family history: Unclear at this time  Substance abuse history: Not a significant part of her ongoing problem HPI Elements:   Quality:  Psychosis and paranoia. Severity:  Mild to moderate. Timing:  Stable. Duration:  Chronic. Context:  Improved access to care.  Past Medical History:  Past Medical History  Diagnosis Date  . DM2 (diabetes mellitus, type 2)   . Hypertension   . Hypercholesteremia   . Schizophrenia   . Osteoarthritis   . Bursitis   . OSA (obstructive sleep apnea)     does not use machine  . Left ventricular outflow tract obstruction     a. echo 03/2014: EF 60-65%, hypernamic LV systolic fxn, mod LVH w/ LVOT gradient estimated at 68 mm Hg w/ valsalva, very small LV internal cavity size, mildly increased LV posterior wall thickness, mild Ao valve scl w/o stenosis, diastolic dysfunction, normal RVSP  . LVH (left ventricular hypertrophy)     a.  echo suggests long standing uncontrolled htn. she will not do well when dehydrated, LV cavity obliteration  . Obesity   . CHF (congestive heart failure)   . Anxiety   . COPD (chronic obstructive pulmonary disease)     Past Surgical History  Procedure Laterality Date  . Tonsillectomy    . Knee  reconstruction, medial patellar femoral ligament    . Cholecystectomy    . Colonoscopy  04/2011    [facility] per patient incomplete   Family History:  Family History  Problem Relation Age of Onset  . Colon cancer Neg Hx   . Liver disease Neg Hx    Social History:  History  Alcohol Use No     History  Drug Use No    History   Social History  . Marital Status: Widowed    Spouse Name: N/A  . Number of Children: 2  . Years of Education: N/A   Social History Main Topics  . Smoking status: Former Research scientist (life sciences)  . Smokeless tobacco: Never Used  . Alcohol Use: No  . Drug Use: No  . Sexual Activity: Not Currently   Other Topics Concern  . None   Social History Narrative   Additional Social History:                          Allergies:   Allergies  Allergen Reactions  . Haldol [Haloperidol Decanoate] Swelling and Other (See Comments)    Tongue swells and blurred vision  . Metformin Diarrhea  . Raspberry Swelling    Other reaction(s): SWELLINGin lips    Labs:  Results for orders placed or performed during the hospital encounter of 08/09/14 (from the past 48 hour(s))  Glucose, capillary     Status: None   Collection Time: 08/09/14  9:49 AM  Result Value Ref Range   Glucose-Capillary 74 65 - 99 mg/dL   Comment 1 Notify RN   Basic metabolic panel     Status: Abnormal   Collection Time: 08/09/14  9:55 AM  Result Value Ref Range   Sodium 144 135 - 145 mmol/L   Potassium 4.2 3.5 - 5.1 mmol/L   Chloride 98 (L) 101 - 111 mmol/L   CO2 36 (H) 22 - 32 mmol/L   Glucose, Bld 100 (H) 65 - 99 mg/dL   BUN 12 6 - 20 mg/dL   Creatinine, Ser 0.92 0.44 - 1.00 mg/dL   Calcium 9.4 8.9 - 10.3 mg/dL   GFR calc non Af Amer >60 >60 mL/min   GFR calc Af Amer >60 >60 mL/min    Comment: (NOTE) The eGFR has been calculated using the CKD EPI equation. This calculation has not been validated in all clinical situations. eGFR's persistently <60 mL/min signify possible Chronic  Kidney Disease.    Anion gap 10 5 - 15  CBC     Status: Abnormal   Collection Time: 08/09/14  9:55 AM  Result Value Ref Range   WBC 10.3 3.6 - 11.0 K/uL   RBC 4.36 3.80 - 5.20 MIL/uL   Hemoglobin 12.2 12.0 - 16.0 g/dL   HCT 38.9 35.0 - 47.0 %   MCV 89.2 80.0 - 100.0 fL   MCH 27.8 26.0 - 34.0 pg   MCHC 31.2 (L) 32.0 - 36.0 g/dL   RDW 14.5 11.5 - 14.5 %   Platelets 251 150 - 440 K/uL  Brain natriuretic peptide     Status: None   Collection Time: 08/09/14  9:55 AM  Result Value Ref Range   B Natriuretic Peptide 47.0 0.0 - 100.0 pg/mL    Vitals: Blood pressure 152/96, pulse 71, temperature 98.5 F (36.9 C), temperature source Oral, resp. rate 23, height '5\' 6"'  (1.676 m), weight 131.09 kg (289 lb), SpO2 99 %.  Risk to Self: Is patient at risk for suicide?: No Risk to Others:   Prior Inpatient Therapy:   Prior Outpatient Therapy:    Current Facility-Administered Medications  Medication Dose Route Frequency Provider Last Rate Last Dose  . acetaminophen (TYLENOL) tablet 650 mg  650 mg Oral Q4H PRN Ahmed Prima, MD      . albuterol (PROVENTIL HFA;VENTOLIN HFA) 108 (90 BASE) MCG/ACT inhaler 2 puff  2 puff Inhalation QID PRN Ahmed Prima, MD      . Derrill Memo ON 08/10/2014] aspirin EC tablet 81 mg  81 mg Oral BH-q7a Ahmed Prima, MD      . atorvastatin (LIPITOR) tablet 20 mg  20 mg Oral QHS Ahmed Prima, MD      . citalopram (CELEXA) tablet 10 mg  10 mg Oral Daily Ahmed Prima, MD      . famotidine (PEPCID) tablet 20 mg  20 mg Oral Daily Ahmed Prima, MD      . furosemide (LASIX) tablet 40 mg  40 mg Oral Daily Ahmed Prima, MD      . glimepiride (AMARYL) tablet 2 mg  2 mg Oral BID Ahmed Prima, MD      . glipiZIDE (GLUCOTROL) tablet 10 mg  10 mg Oral BID Ahmed Prima, MD      . insulin glargine (LANTUS) injection 15 Units  15 Units Subcutaneous QHS Ahmed Prima, MD      . linagliptin (TRADJENTA) tablet 5 mg  5 mg Oral Daily Ahmed Prima, MD      .  Derrill Memo ON 08/10/2014] lisinopril (PRINIVIL,ZESTRIL) tablet 5 mg  5 mg Oral BH-q7a Ahmed Prima, MD      . LORazepam (ATIVAN) tablet 0.5 mg  0.5 mg Oral TID Ahmed Prima, MD      . metoprolol succinate (TOPROL-XL) 24 hr tablet 50 mg  50 mg Oral Daily Ahmed Prima, MD      . mometasone-formoterol [facility]) 100-5 MCG/ACT inhaler 2 puff  2 puff Inhalation BID Ahmed Prima, MD      . polyethylene glycol powder [facility]) container 17 g  17 g Oral Daily PRN Ahmed Prima, MD      . risperiDONE (RISPERDAL) tablet 0.5 mg  0.5 mg Oral BID Ahmed Prima, MD      . Derrill Memo ON 08/10/2014] risperiDONE (RISPERDAL) tablet 0.5 mg  0.5 mg Oral q morning - 10a Ahmed Prima, MD      . spironolactone (ALDACTONE) tablet 25 mg  25 mg Oral Daily Ahmed Prima, MD      . tiotropium [facility]) inhalation capsule 18 mcg  18 mcg Inhalation Daily Ahmed Prima, MD       Current Outpatient Prescriptions  Medication Sig Dispense Refill  . albuterol (PROVENTIL HFA;VENTOLIN HFA) 108 (90 BASE) MCG/ACT inhaler Inhale 2 puffs into the lungs 4 (four) times daily as needed for wheezing or shortness of breath.    Marland Kitchen aspirin EC 81 MG tablet Take 81 mg by mouth every morning.     Marland Kitchen atorvastatin (LIPITOR) 20 MG tablet Take 20 mg by mouth at bedtime.     . citalopram (CELEXA)  10 MG tablet Take 1 tablet by mouth daily.    . furosemide (LASIX) 20 MG tablet Take 40 mg by mouth daily. On Monday, Wednesday, and Friday    . glimepiride (AMARYL) 2 MG tablet Take 1 tablet by mouth 2 (two) times daily.    . insulin glargine (LANTUS) 100 UNIT/ML injection Inject 0.3 mLs (30 Units total) into the skin at bedtime. (Patient taking differently: Inject 15 Units into the skin at bedtime. ) 10 mL 2  . ketoconazole (NIZORAL) 2 % cream Apply 1 application topically 2 (two) times daily.    Marland Kitchen lisinopril (PRINIVIL,ZESTRIL) 5 MG tablet Take 5 mg by mouth every morning.     . metoprolol succinate (TOPROL-XL) 50 MG 24 hr tablet  Take 1 tablet by mouth daily.    . risperiDONE (RISPERDAL) 0.5 MG tablet Take 1 tablet by mouth every morning. Take 1 tablet in the morning and take 2 tablets in the evening.    . sitaGLIPtin (JANUVIA) 100 MG tablet Take 100 mg by mouth every morning.     Marland Kitchen acetaminophen (TYLENOL) 325 MG tablet Take 650 mg by mouth every 4 (four) hours as needed for mild pain.     Jeralyn Ruths 80 MCG/ACT AERS Take 1 puff by mouth 2 (two) times daily.    . bisacodyl (DULCOLAX) 10 MG suppository Place 10 mg rectally daily as needed for mild constipation or moderate constipation.    Marland Kitchen FLOVENT HFA 220 MCG/ACT inhaler Take 5 puffs by mouth 2 (two) times daily.    . fluPHENAZine decanoate (PROLIXIN) 25 MG/ML injection Inject 50 mg into the muscle every 21 ( twenty-one) days.     . Fluticasone-Salmeterol (ADVAIR) 250-50 MCG/DOSE AEPB Inhale 1 puff into the lungs 2 (two) times daily.    Marland Kitchen glipiZIDE (GLUCOTROL) 10 MG tablet Take 10 mg by mouth 2 (two) times daily.     . hydrocortisone 2.5 % cream Apply 1 application topically 2 (two) times daily. Apply to left nipple and right thigh    . LORazepam (ATIVAN) 0.5 MG tablet Take 0.5 mg by mouth 3 (three) times daily.     . magnesium hydroxide (MILK OF MAGNESIA) 400 MG/5ML suspension Take 30 mLs by mouth daily as needed for mild constipation.    . polyethylene glycol powder (GLYCOLAX/MIRALAX) powder Take 17 g by mouth daily as needed.    . ranitidine (ZANTAC) 150 MG tablet Take 150 mg by mouth 2 (two) times daily.     Marland Kitchen senna-docusate (SENOKOT-S) 8.6-50 MG per tablet Take 2 tablets by mouth daily as needed for mild constipation or moderate constipation. May use 1-2 times daily as needed    . spironolactone (ALDACTONE) 25 MG tablet Take 1 tablet by mouth daily.    Marland Kitchen tiotropium (SPIRIVA) 18 MCG inhalation capsule Place 1 capsule (18 mcg total) into inhaler and inhale daily. (Patient not taking: Reported on April 20, 202016) 30 capsule 12    Musculoskeletal: Strength & Muscle Tone:  decreased Gait & Station: unable to stand Patient leans: N/A  Psychiatric Specialty Exam: Physical Exam  Constitutional: She appears well-developed and well-nourished.  HENT:  Head: Normocephalic and atraumatic.  Eyes: Conjunctivae are normal. Pupils are equal, round, and reactive to light.  Neck: Normal range of motion.  Cardiovascular: Normal heart sounds.   Respiratory: Effort normal.  GI: Soft.  Musculoskeletal: Normal range of motion. She exhibits edema.  Neurological: She is alert.  Skin: Skin is warm and dry.  Psychiatric: She has a normal mood and affect. Her  speech is normal and behavior is normal. Thought content is paranoid. Cognition and memory are normal. She expresses impulsivity.  Patient actually appears to be in very good mental condition given her past history. She is a little bit paranoid but not threatening or agitated. Generally cooperative.    Review of Systems  Constitutional: Negative.   HENT: Negative.   Eyes: Negative.   Respiratory: Negative.   Cardiovascular: Positive for leg swelling.  Gastrointestinal: Negative.   Musculoskeletal: Positive for myalgias.  Skin: Negative.   Neurological: Negative.   Psychiatric/Behavioral: Negative for depression, suicidal ideas, hallucinations and substance abuse. The patient is not nervous/anxious and does not have insomnia.     Blood pressure 152/96, pulse 71, temperature 98.5 F (36.9 C), temperature source Oral, resp. rate 23, height '5\' 6"'  (1.676 m), weight 131.09 kg (289 lb), SpO2 99 %.Body mass index is 46.67 kg/(m^2).  General Appearance: Fairly Groomed  Engineer, water::  Good  Speech:  Normal Rate  Volume:  Increased  Mood:  Anxious  Affect:  Appropriate  Thought Process:  Circumstantial and Tangential  Orientation:  Full (Time, Place, and Person)  Thought Content:  Delusions  Suicidal Thoughts:  No  Homicidal Thoughts:  No  Memory:  Immediate;   Good Recent;   Fair Remote;   Fair  Judgement:  Impaired   Insight:  Shallow  Psychomotor Activity:  Decreased  Concentration:  Fair  Recall:  [employer] of Knowledge:Fair  Language: Good  Akathisia:  No  Handed:  Right  AIMS (if indicated):     Assets:  Communication Skills Desire for Improvement Resilience Social Support  ADL's:  Intact  Cognition: Impaired,  Mild  Sleep:      Medical Decision Making: Established Problem, Stable/Improving (1), Review of Psycho-Social Stressors (1), Review or order clinical lab tests (1) and Review of Medication Regimen & Side Effects (2)  Treatment Plan Summary: Plan Patient is actually doing quite well considering her long history. She is stable with no acute psychiatric complaints. Does not require inpatient psychiatric hospitalization. My understanding is that she is to be transferred to [place] [facility] care and that her son has been arranging this. I would not change anything about her medicine at this point. Continue her outpatient psychiatric medicine. No further follow-up necessary unless something new arises  Plan:  No evidence of imminent risk to self or others at present.   Patient does not meet criteria for psychiatric inpatient admission. Supportive therapy provided about ongoing stressors. Disposition: We will follow if needed no change to medication profile  Alethia Berthold 10/10/2014 8:30 PM

## 2014-08-09 NOTE — ED Notes (Signed)
Pt to ed with c/o weakness for several days, worse x 2 days.  Pt states " I think someone is trying to poison me, my son deals with drug dealers and I think they are trying to kill me"  Pt alert but states she has been foaming at the mouth for several days.  Reports she wants [place] to check her urine for drugs.

## 2014-08-09 NOTE — ED Notes (Signed)
Patient noted to be increasingly anxious and paranoid. Yelling out, "Nurse... Nurse.. Why does everybody that keeps walking by this room keep looking at me... What is everybody looking at?". Patient attempted to call ACT team several times but appears to have the incorrect number. Patient asks, "What did you do with the phone? Its not working... Why did you mess with the phones?". Unable to redirect patient. Patient trying to get out of bed repeatedly. Notified ED MD.

## 2014-08-09 NOTE — ED Notes (Signed)
Patient resting with eyes closed. Respirations even and unlabored. No obvious distress. Will continue to monitor.

## 2014-08-10 DIAGNOSIS — R531 Weakness: Secondary | ICD-10-CM | POA: Diagnosis not present

## 2014-08-10 LAB — GLUCOSE, CAPILLARY: Glucose-Capillary: 208 mg/dL — ABNORMAL HIGH (ref 65–99)

## 2014-08-10 MED ORDER — ASPIRIN 81 MG PO CHEW
CHEWABLE_TABLET | ORAL | Status: AC
  Administered 2014-08-10: 81 mg
  Filled 2014-08-10: qty 1

## 2014-08-10 MED ORDER — GLIPIZIDE 10 MG PO TABS
ORAL_TABLET | ORAL | Status: AC
  Administered 2014-08-10: 10 mg via ORAL
  Filled 2014-08-10: qty 1

## 2014-08-10 MED ORDER — RISPERIDONE 1 MG PO TABS
1.0000 mg | ORAL_TABLET | Freq: Every day | ORAL | Status: DC
  Administered 2014-08-10 – 2014-08-11 (×2): 1 mg via ORAL

## 2014-08-10 MED ORDER — LORAZEPAM 0.5 MG PO TABS
ORAL_TABLET | ORAL | Status: AC
  Administered 2014-08-10: 0.5 mg via ORAL
  Filled 2014-08-10: qty 1

## 2014-08-10 MED ORDER — FUROSEMIDE 40 MG PO TABS
ORAL_TABLET | ORAL | Status: AC
Start: 2014-08-10 — End: 2014-08-10
  Administered 2014-08-10: 40 mg via ORAL
  Filled 2014-08-10: qty 1

## 2014-08-10 MED ORDER — FAMOTIDINE 20 MG PO TABS
ORAL_TABLET | ORAL | Status: AC
  Administered 2014-08-10: 20 mg via ORAL
  Filled 2014-08-10: qty 1

## 2014-08-10 MED ORDER — CITALOPRAM HYDROBROMIDE 20 MG PO TABS
ORAL_TABLET | ORAL | Status: AC
  Filled 2014-08-10: qty 1

## 2014-08-10 MED ORDER — RISPERIDONE 1 MG PO TABS
ORAL_TABLET | ORAL | Status: AC
  Filled 2014-08-10: qty 1

## 2014-08-10 MED ORDER — ATORVASTATIN CALCIUM 20 MG PO TABS
ORAL_TABLET | ORAL | Status: AC
  Filled 2014-08-10: qty 1

## 2014-08-10 NOTE — BH Assessment (Signed)
Assessment Note  [name] [name] is an 69 y.o. female, who presents to the ED via her son for c/o not taking her medications; increased confusion; paranoia; thinks that she is being poisoned by her son; who's selling drugs; having fancy cars and riding around." Per son, her condition has worsened; I'm not able to care for her any longer; she refuses to do anything; very paranoid."    Axis I: Bipolar, mixed and Chronic Paranoid Schizophrenia Axis II: Deferred Axis III:  Past Medical History  Diagnosis Date  . DM2 (diabetes mellitus, type 2)   . Hypertension   . Hypercholesteremia   . Schizophrenia   . Osteoarthritis   . Bursitis   . OSA (obstructive sleep apnea)     does not use machine  . Left ventricular outflow tract obstruction     a. echo 03/2014: EF 60-65%, hypernamic LV systolic fxn, mod LVH w/ LVOT gradient estimated at 68 mm Hg w/ valsalva, very small LV internal cavity size, mildly increased LV posterior wall thickness, mild Ao valve scl w/o stenosis, diastolic dysfunction, normal RVSP  . LVH (left ventricular hypertrophy)     a. echo suggests long standing uncontrolled htn. she will not do well when dehydrated, LV cavity obliteration  . Obesity   . CHF (congestive heart failure)   . Anxiety   . COPD (chronic obstructive pulmonary disease)    Axis IV: other psychosocial or environmental problems, problems with access to health care services and problems with primary support group Axis V: 21-30 behavior considerably influenced by delusions or hallucinations OR serious impairment in judgment, communication OR inability to function in almost all areas  Past Medical History:  Past Medical History  Diagnosis Date  . DM2 (diabetes mellitus, type 2)   . Hypertension   . Hypercholesteremia   . Schizophrenia   . Osteoarthritis   . Bursitis   . OSA (obstructive sleep apnea)     does not use machine  . Left ventricular outflow tract obstruction     a. echo 03/2014: EF 60-65%,  hypernamic LV systolic fxn, mod LVH w/ LVOT gradient estimated at 68 mm Hg w/ valsalva, very small LV internal cavity size, mildly increased LV posterior wall thickness, mild Ao valve scl w/o stenosis, diastolic dysfunction, normal RVSP  . LVH (left ventricular hypertrophy)     a. echo suggests long standing uncontrolled htn. she will not do well when dehydrated, LV cavity obliteration  . Obesity   . CHF (congestive heart failure)   . Anxiety   . COPD (chronic obstructive pulmonary disease)     Past Surgical History  Procedure Laterality Date  . Tonsillectomy    . Knee reconstruction, medial patellar femoral ligament    . Cholecystectomy    . Colonoscopy  04/2011    [facility] per patient incomplete    Family History:  Family History  Problem Relation Age of Onset  . Colon cancer Neg Hx   . Liver disease Neg Hx     Social History:  reports that she has quit smoking. She has never used smokeless tobacco. She reports that she does not drink alcohol or use illicit drugs.  Additional Social History:     CIWA: CIWA-Ar BP: (!) 158/72 mmHg Pulse Rate: 64 COWS:    Allergies:  Allergies  Allergen Reactions  . Haldol [Haloperidol Decanoate] Swelling and Other (See Comments)    Tongue swells and blurred vision  . Metformin Diarrhea  . Raspberry Swelling    Other reaction(s): SWELLINGin  lips    Home Medications:  (Not in a hospital admission)  OB/GYN Status:  No LMP recorded. Patient is postmenopausal.              Risk to self with the past 6 months Is patient at risk for suicide?: No                                     Advance Directives (For Healthcare) Does patient have an advance directive?: No Would patient like information on creating an advanced directive?: Yes - Educational materials given          Disposition:     On Site Evaluation by:   Reviewed with Physician:    Maris Berger 08/10/2014 8:35 AM

## 2014-08-10 NOTE — ED Notes (Signed)
BEHAVIORAL HEALTH ROUNDING Patient sleeping: No. Patient alert and oriented: yes Behavior appropriate: Yes.  ; If no, describe:  Nutrition and fluids offered: Yes  Toileting and hygiene offered: Yes  Sitter present: not applicable Law enforcement present: Yes  

## 2014-08-10 NOTE — ED Notes (Signed)

## 2014-08-10 NOTE — ED Notes (Signed)
BEHAVIORAL HEALTH ROUNDING  Patient sleeping: Yes.  Patient alert and oriented: Sleeping  Behavior appropriate: Yes. ; If no, describe:  Nutrition and fluids offered: Sleeping  Toileting and hygiene offered: Sleeping  Sitter present: Yes  Law enforcement present: Yes   

## 2014-08-10 NOTE — ED Notes (Signed)
BEHAVIORAL HEALTH ROUNDING  Patient sleeping: Yes.  Patient alert and oriented: Sleeping  Behavior appropriate: Yes. ; If no, describe:  Nutrition and fluids offered: Sleeping  Toileting and hygiene offered: Sleeping  Sitter present: yes  Law enforcement present: Yes   

## 2014-08-10 NOTE — ED Notes (Signed)
BEHAVIORAL HEALTH ROUNDING Patient sleeping: No. Patient alert and oriented: yes Behavior appropriate: Yes.  ; If no, describe:  Nutrition and fluids offered: No Toileting and hygiene offered: Yes  Sitter present: not applicable Law enforcement present: Yes

## 2014-08-10 NOTE — ED Notes (Signed)
BEHAVIORAL HEALTH ROUNDING Patient sleeping: Yes.   Patient alert and oriented: not applicable Behavior appropriate: Yes.  ; If no, describe:  Nutrition and fluids offered: No Toileting and hygiene offered: No Sitter present: not applicable Law enforcement present: Yes  

## 2014-08-10 NOTE — Progress Notes (Signed)
CSW continues to work to placement patient in facility.   Level II PASSR number is needed in order for patient to go to facility. Information has been submitted and is currently pending.  Call to patient's [facility] ACT team after hours number [phone] spoke to [place].  Will call CSW on Monday after their morning meeting to discuss possible options for patient.  ACT team also called patient's son who continues to refuse to pick patient up states patient is better off in ED than at home.  CSW has also spoken to son and he will not pick up patient.  Son is waiting for her to go from [facility] to [facility] on PASARR is approved. CSW unable to move forward with disposition until Level II PASSAR number is received.    CSW will continue to check for updates on number. Was informed by PASSAR it could take 5 to 7 days to obtain number.  Casimer Lanius. Latanya Presser, MSW Clinical Social Work Department Emergency Room [phone] 2:55 PM

## 2014-08-10 NOTE — ED Notes (Signed)
Pt attempts to solicit help that is not needed;, i.e,. pt requests bedpan when pt is capable with one-assist to get out of bed.  She can lift her legs to return to bed and does not need to have help lifting them.  More than anything, she needs encouragement to help herself.

## 2014-08-10 NOTE — ED Notes (Signed)
Pt assisted OOB to bedside commode.

## 2014-08-10 NOTE — ED Notes (Signed)
Pt OOB to toilet.  Pt, floor, and bed cleaned.

## 2014-08-10 NOTE — ED Notes (Signed)
Pt states "my left breast is itching and has been for days." Pt offered washcloth and pt declined.

## 2014-08-10 NOTE — ED Notes (Signed)
BEHAVIORAL HEALTH ROUNDING Patient sleeping: No. Patient alert and oriented: yes Behavior appropriate: Yes.  ; If no, describe:  Nutrition and fluids offered: Yes  Toileting and hygiene offered: Yes  Sitter present: yes Law enforcement present: Yes  

## 2014-08-11 DIAGNOSIS — R531 Weakness: Secondary | ICD-10-CM | POA: Diagnosis not present

## 2014-08-11 LAB — GLUCOSE, CAPILLARY
GLUCOSE-CAPILLARY: 95 mg/dL (ref 65–99)
GLUCOSE-CAPILLARY: 159 mg/dL — AB (ref 65–99)
Glucose-Capillary: 178 mg/dL — ABNORMAL HIGH (ref 65–99)

## 2014-08-11 MED ORDER — LORAZEPAM 0.5 MG PO TABS
ORAL_TABLET | ORAL | Status: AC
Start: 2014-08-11 — End: 2014-08-11
  Filled 2014-08-11: qty 1

## 2014-08-11 MED ORDER — ASPIRIN EC 81 MG PO TBEC
DELAYED_RELEASE_TABLET | ORAL | Status: AC
  Administered 2014-08-11: 81 mg via ORAL
  Filled 2014-08-11: qty 1

## 2014-08-11 MED ORDER — FUROSEMIDE 40 MG PO TABS
ORAL_TABLET | ORAL | Status: AC
  Filled 2014-08-11: qty 1

## 2014-08-11 MED ORDER — RISPERIDONE 1 MG PO TABS
ORAL_TABLET | ORAL | Status: AC
  Filled 2014-08-11: qty 1

## 2014-08-11 MED ORDER — RISPERIDONE 1 MG PO TABS
ORAL_TABLET | ORAL | Status: AC
  Administered 2014-08-11: 1 mg via ORAL
  Filled 2014-08-11: qty 1

## 2014-08-11 MED ORDER — LORAZEPAM 0.5 MG PO TABS
ORAL_TABLET | ORAL | Status: AC
  Filled 2014-08-11: qty 1

## 2014-08-11 MED ORDER — GLIPIZIDE 10 MG PO TABS
ORAL_TABLET | ORAL | Status: AC
  Administered 2014-08-11: 10 mg via ORAL
  Filled 2014-08-11: qty 1

## 2014-08-11 MED ORDER — GLIPIZIDE 10 MG PO TABS
ORAL_TABLET | ORAL | Status: AC
Start: 2014-08-11 — End: 2014-08-11
  Filled 2014-08-11: qty 1

## 2014-08-11 MED ORDER — LORAZEPAM 0.5 MG PO TABS
ORAL_TABLET | ORAL | Status: AC
  Administered 2014-08-11: 0.5 mg via ORAL
  Filled 2014-08-11: qty 1

## 2014-08-11 MED ORDER — ATORVASTATIN CALCIUM 20 MG PO TABS
ORAL_TABLET | ORAL | Status: AC
  Administered 2014-08-11: 20 mg via ORAL
  Filled 2014-08-11: qty 1

## 2014-08-11 MED ORDER — METOPROLOL TARTRATE 50 MG PO TABS
ORAL_TABLET | ORAL | Status: AC
  Administered 2014-08-11: 50 mg
  Filled 2014-08-11: qty 1

## 2014-08-11 NOTE — ED Notes (Signed)
BEHAVIORAL HEALTH ROUNDING Patient sleeping: No. Patient alert and oriented: yes Behavior appropriate: Yes.  ; If no, describe:  Nutrition and fluids offered: Yes  Toileting and hygiene offered: Yes  Sitter present: not applicable Law enforcement present: Yes  

## 2014-08-11 NOTE — ED Notes (Signed)
BEHAVIORAL HEALTH ROUNDING Patient sleeping: Yes.   Patient alert and oriented: not applicable Behavior appropriate: Yes.  ; If no, describe:   Nutrition and fluids offered: No Toileting and hygiene offered: No Sitter present: no Law enforcement present: Yes  and ODS    

## 2014-08-11 NOTE — ED Notes (Signed)
Pt sleeping. Awakened for assessment. Pt calm and cooperative at this time.  APPEARANCE/BEHAVIOR  calm, cooperative and adequate rapport can be established  NEURO ASSESSMENT  Orientation:  place and person  Hallucinations: No.None noted (Hallucinations)  Speech: Normal  Gait: normal  RESPIRATORY ASSESSMENT  Lungs clear, resp even and unlabored. Pt on O2 per MD orders at this time.  CARDIOVASCULAR ASSESSMENT  WNL  GASTROINTESTINAL ASSESSMENT  WNL  EXTREMITIES  ROM of all joints is normal  PLAN OF CARE  Provide calm/safe environment. Vital signs assessed twice daily. ED BHU Assessment once each 12-hour shift. Collaborate with intake RN daily or as condition indicates. Assure the ED provider has rounded once each shift. Provide and encourage hygiene. Provide redirection as needed. Assess for escalating behavior; address immediately and inform ED provider.  Assess family dynamic and appropriateness for visitation as needed: Yes. ; If necessary, describe findings:  Educate the patient/family about BHU procedures/visitation: Yes. ; If necessary, describe findings: Pt calm and cooperative at this time. Will continue to monitor.

## 2014-08-11 NOTE — ED Notes (Signed)
ENVIRONMENTAL ASSESSMENT  Potentially harmful objects out of patient reach: Yes.  Personal belongings secured: Yes.  Patient dressed in hospital provided attire only: Yes.  Plastic bags out of patient reach: Yes.  Patient care equipment (cords, cables, call bells, lines, and drains) shortened, removed, or accounted for: Yes.  Equipment and supplies removed from bottom of stretcher: Yes.  Potentially toxic materials out of patient reach: Yes.  Sharps container removed or out of patient reach: Yes.   BEHAVIORAL HEALTH ROUNDING Patient sleeping: Yes.   Patient alert and oriented: not applicable SLEEPING Behavior appropriate: Yes.  ; If no, describe: SLEEPING Nutrition and fluids offered: No SLEEPING Toileting and hygiene offered: NoSLEEPING Sitter present: not applicable Law enforcement present: Yes ODS 

## 2014-08-11 NOTE — ED Provider Notes (Signed)
Causes of patient's prolonged time in the ER, and multiple medical problems, I have asked her the medicine service to perform consultation to assist with management of patient's chronic medical problems while she is in the ER.  Dr. Mindi Slicker will see in consult.  Delman Kitten, MD 08/11/14 [phone]

## 2014-08-11 NOTE — ED Notes (Signed)

## 2014-08-11 NOTE — ED Notes (Signed)
BEHAVIORAL HEALTH ROUNDING Patient sleeping: Yes.   Patient alert and oriented: not applicable SLEEPING Behavior appropriate: Yes.  ; If no, describe: SLEEPING Nutrition and fluids offered: No SLEEPING Toileting and hygiene offered: NoSLEEPING Sitter present: not applicable Law enforcement present: Yes ODS 

## 2014-08-11 NOTE — ED Notes (Signed)
BEHAVIORAL HEALTH ROUNDING  Patient sleeping: yes  Patient alert and oriented: yes  Behavior appropriate: Yes. ; If no, describe:  Nutrition and fluids offered: Yes  Toileting and hygiene offered: Yes  Sitter present: not applicable  Law enforcement present: Yes

## 2014-08-11 NOTE — ED Provider Notes (Signed)
-----------------------------------------   8:21 AM on 08/11/2014 -----------------------------------------   BP 166/79 mmHg  Pulse 75  Temp(Src) 98.8 F (37.1 C) (Oral)  Resp 22  Ht 5\' 6"  (1.676 m)  Wt 289 lb (131.09 kg)  BMI 46.67 kg/m2  SpO2 97%  The patient had no acute events since last update.  Calm and cooperative at this time.  Currently working with social workers to place the patient into an appropriate living facility. Unlikely to have much progress event it is a Sunday, but will continue to work with social workers tomorrow for placement.   Harvest Dark, MD 08/11/14 [phone]

## 2014-08-11 NOTE — Progress Notes (Signed)
CSW received message from [facility] MUST requesting additional information on patient in ref. to [facility] number.  Requested information loaded and faxed.   CSW will continue to follow and assist with placement.   Casimer Lanius. Latanya Presser, [place] Work Department Emergency Room [phone] 6:34 PM

## 2014-08-11 NOTE — ED Notes (Signed)
Patient assigned to appropriate care area. Patient oriented to unit/care area: Informed that, for their safety, care areas are designed for safety and monitored by staff at all times; and visiting hours explained to patient. Patient verbalizes understanding, and verbal contract for safety obtained. 

## 2014-08-11 NOTE — Consult Note (Signed)
[facility] [place] NAME: [name] [name]    MR#:  [id number]  DATE OF BIRTH:  07-31-1945  DATE OF Consult:  March 02, 202016  PRIMARY CARE PHYSICIAN: Alvester Chou, NP   REQUESTING/REFERRING PHYSICIAN: Dr. Delman Kitten.    CHIEF COMPLAINT:   Chief Complaint  Patient presents with  . Weakness   medical management for chronic medical problems  HISTORY OF PRESENT ILLNESS:  [name] [name]  is a 69 y.o. female with a known history of type 2 diabetes, hypertension, hyperlipidemia, schizophrenia, osteoarthritis, obstructive sleep apnea, history of CHF, morbid obesity, medical noncompliance who presented to the emergency room due to weakness.  Patient has been in the emergency room now for the past 2 days awaiting placement possibly to a Geri psychiatric unit and hospitalist services were contacted for management of her chronic medical problems. Patient presently complains of some mild lower extremity edema and pain but no other associated symptoms presently. She herself although is a very poor historian.    PAST MEDICAL HISTORY:   Past Medical History  Diagnosis Date  . DM2 (diabetes mellitus, type 2)   . Hypertension   . Hypercholesteremia   . Schizophrenia   . Osteoarthritis   . Bursitis   . OSA (obstructive sleep apnea)     does not use machine  . Left ventricular outflow tract obstruction     a. echo 03/2014: EF 60-65%, hypernamic LV systolic fxn, mod LVH w/ LVOT gradient estimated at 68 mm Hg w/ valsalva, very small LV internal cavity size, mildly increased LV posterior wall thickness, mild Ao valve scl w/o stenosis, diastolic dysfunction, normal RVSP  . LVH (left ventricular hypertrophy)     a. echo suggests long standing uncontrolled htn. she will not do well when dehydrated, LV cavity obliteration  . Obesity   . CHF (congestive heart failure)   . Anxiety   . COPD (chronic obstructive pulmonary disease)     PAST SURGICAL HISTOIRY:    Past Surgical History  Procedure Laterality Date  . Tonsillectomy    . Knee reconstruction, medial patellar femoral ligament    . Cholecystectomy    . Colonoscopy  04/2011    [facility] per patient incomplete    SOCIAL HISTORY:   History  Substance Use Topics  . Smoking status: Former Research scientist (life sciences)  . Smokeless tobacco: Never Used  . Alcohol Use: No    FAMILY HISTORY:   Family History  Problem Relation Age of Onset  . Colon cancer Neg Hx   . Liver disease Neg Hx     DRUG ALLERGIES:   Allergies  Allergen Reactions  . Haldol [Haloperidol Decanoate] Swelling and Other (See Comments)    Tongue swells and blurred vision  . Metformin Diarrhea  . Raspberry Swelling    Other reaction(s): SWELLINGin lips    REVIEW OF SYSTEMS:   Review of Systems  Constitutional: Negative for fever and weight loss.  HENT: Negative for congestion, nosebleeds and tinnitus.   Eyes: Negative for blurred vision, double vision and redness.  Respiratory: Negative for cough, hemoptysis and shortness of breath.   Cardiovascular: Negative for chest pain, orthopnea, leg swelling and PND.  Gastrointestinal: Negative for nausea, vomiting, abdominal pain, diarrhea and melena.  Genitourinary: Negative for dysuria, urgency and hematuria.  Musculoskeletal: Negative for joint pain and falls.  Skin: Negative for rash.  Neurological: Positive for weakness (genearlized). Negative for dizziness, tingling, sensory change, focal weakness, seizures and headaches.  Endo/Heme/Allergies: Negative for polydipsia. Does  not bruise/bleed easily.  Psychiatric/Behavioral: Negative for depression and memory loss. The patient is not nervous/anxious.     MEDICATIONS AT HOME:   Prior to Admission medications   Medication Sig Start Date End Date Taking? Authorizing Provider  albuterol (PROVENTIL HFA;VENTOLIN HFA) 108 (90 BASE) MCG/ACT inhaler Inhale 2 puffs into the lungs 4 (four) times daily as needed for wheezing or shortness of  breath.   Yes Historical Provider, MD  aspirin EC 81 MG tablet Take 81 mg by mouth every morning.    Yes Historical Provider, MD  atorvastatin (LIPITOR) 20 MG tablet Take 20 mg by mouth at bedtime.    Yes Historical Provider, MD  citalopram (CELEXA) 10 MG tablet Take 1 tablet by mouth daily. 08/01/14  Yes Historical Provider, MD  furosemide (LASIX) 20 MG tablet Take 40 mg by mouth daily. On Monday, Wednesday, and Friday   Yes Historical Provider, MD  glimepiride (AMARYL) 2 MG tablet Take 1 tablet by mouth 2 (two) times daily. 08/01/14  Yes Historical Provider, MD  insulin glargine (LANTUS) 100 UNIT/ML injection Inject 0.3 mLs (30 Units total) into the skin at bedtime. Patient taking differently: Inject 15 Units into the skin at bedtime.  07/02/14  Yes [name], MD  ketoconazole (NIZORAL) 2 % cream Apply 1 application topically 2 (two) times daily. 08/01/14  Yes Historical Provider, MD  lisinopril (PRINIVIL,ZESTRIL) 5 MG tablet Take 5 mg by mouth every morning.    Yes Historical Provider, MD  metoprolol succinate (TOPROL-XL) 50 MG 24 hr tablet Take 1 tablet by mouth daily. 08/01/14  Yes Historical Provider, MD  risperiDONE (RISPERDAL) 0.5 MG tablet Take 1 tablet by mouth every morning. Take 1 tablet in the morning and take 2 tablets in the evening. 08/01/14  Yes Historical Provider, MD  sitaGLIPtin (JANUVIA) 100 MG tablet Take 100 mg by mouth every morning.    Yes Historical Provider, MD  acetaminophen (TYLENOL) 325 MG tablet Take 650 mg by mouth every 4 (four) hours as needed for mild pain.     Historical Provider, MD  AEROSPAN 80 MCG/ACT AERS Take 1 puff by mouth 2 (two) times daily. 08/08/14   Historical Provider, MD  bisacodyl (DULCOLAX) 10 MG suppository Place 10 mg rectally daily as needed for mild constipation or moderate constipation.    Historical Provider, MD  FLOVENT HFA 220 MCG/ACT inhaler Take 5 puffs by mouth 2 (two) times daily. 08/05/14   Historical Provider, MD  fluPHENAZine decanoate  (PROLIXIN) 25 MG/ML injection Inject 50 mg into the muscle every 21 ( twenty-one) days.     Historical Provider, MD  Fluticasone-Salmeterol (ADVAIR) 250-50 MCG/DOSE AEPB Inhale 1 puff into the lungs 2 (two) times daily.    Historical Provider, MD  glipiZIDE (GLUCOTROL) 10 MG tablet Take 10 mg by mouth 2 (two) times daily.     Historical Provider, MD  hydrocortisone 2.5 % cream Apply 1 application topically 2 (two) times daily. Apply to left nipple and right thigh    Historical Provider, MD  LORazepam (ATIVAN) 0.5 MG tablet Take 0.5 mg by mouth 3 (three) times daily.     Historical Provider, MD  magnesium hydroxide (MILK OF MAGNESIA) 400 MG/5ML suspension Take 30 mLs by mouth daily as needed for mild constipation.    Historical Provider, MD  polyethylene glycol powder (GLYCOLAX/MIRALAX) powder Take 17 g by mouth daily as needed. 08/01/14   Historical Provider, MD  ranitidine (ZANTAC) 150 MG tablet Take 150 mg by mouth 2 (two) times daily.  Historical Provider, MD  senna-docusate (SENOKOT-S) 8.6-50 MG per tablet Take 2 tablets by mouth daily as needed for mild constipation or moderate constipation. May use 1-2 times daily as needed    Historical Provider, MD  spironolactone (ALDACTONE) 25 MG tablet Take 1 tablet by mouth daily. 08/01/14   Historical Provider, MD  tiotropium (SPIRIVA) 18 MCG inhalation capsule Place 1 capsule (18 mcg total) into inhaler and inhale daily. Patient not taking: Reported on 09/03/202016 07/02/14   [name], MD      VITAL SIGNS:  Blood pressure 143/83, pulse 81, temperature 97.7 F (36.5 C), temperature source Oral, resp. rate 19, height 5\' 6"  (1.676 m), weight 131.09 kg (289 lb), SpO2 95 %.  PHYSICAL EXAMINATION:  GENERAL:  69 y.o.-year-old patient lying in the bed with no acute distress.  EYES: Pupils equal, round, reactive to light and accommodation. No scleral icterus. Extraocular muscles intact.  HEENT: Head atraumatic, normocephalic. Oropharynx and nasopharynx  clear.  NECK:  Supple, no jugular venous distention. No thyroid enlargement, no tenderness.  LUNGS: Normal breath sounds bilaterally, no wheezing, rales,rhonchi or crepitation. No use of accessory muscles of respiration.  CARDIOVASCULAR: S1, S2 RRR. No murmurs, rubs, or gallops.  ABDOMEN: Soft, nontender, nondistended. Bowel sounds present. No organomegaly or mass.  EXTREMITIES: + 1 edema b/l, no cyanosis, clubbing b/l.  NEUROLOGIC: Cranial nerves II through XII are intact. Focal motor or sensory deficits appreciated bilaterally.  PSYCHIATRIC: The patient is alert and oriented x 3. Good affect SKIN: No obvious rash, lesion, or ulcer.   LABORATORY PANEL:   CBC  Recent Labs Lab 08/09/14 0955  WBC 10.3  HGB 12.2  HCT 38.9  PLT 251   ------------------------------------------------------------------------------------------------------------------  Chemistries   Recent Labs Lab 08/09/14 0955  NA 144  K 4.2  CL 98*  CO2 36*  GLUCOSE 100*  BUN 12  CREATININE 0.92  CALCIUM 9.4   ------------------------------------------------------------------------------------------------------------------  Cardiac Enzymes No results for input(s): TROPONINI in the last 168 hours. ------------------------------------------------------------------------------------------------------------------  RADIOLOGY:  No results found.   IMPRESSION AND PLAN:   69 year old female with past medical history of obstructive sleep apnea, schizophrenia, acute on chronic respiratory failure, diabetes, hypertension, hyperlipidemia, COPD who presented to the hospital due to weakness awaiting placement to a Geri psychiatric unit. Hospitalist services were contacted for management of her chronic medical problems.  #1 acute on chronic respiratory failure-patient does have a history of previous hypercarbic respiratory failure.  She is on CPAP at home but she is not compliant. -We'll get a ABG to rule out  hypercarbic respiratory failure and if needed will place patient on intermittent BiPAP/CPAP  #2 history of CHF-chronic diastolic CHF. Clinically patient does not appear to be in congestive heart failure. -Continue metoprolol, lisinopril, Lasix.  #3 history of schizophrenia-continue Risperdal  #4 type 2 diabetes without complication-continue Lantus, Tradjenta, glimepiride  #5 depression-continue Celexa.  #6 hypertension-hemodynamically stable. -Continue lisinopril, metoprolol.  #7 COPD-no acute exacerbation. -Continue Dulera, Spiriva albuterol nebulizer as needed.  All the records are reviewed and case discussed with Consulting provider. Management plans discussed with the patient, family and they are in agreement.  CODE STATUS:  full  TOTAL TIME TAKING CARE OF THIS PATIENT:  45 minutes.   Thank you so much for the consultation will follow along with you.  [name] M.D on 08/11/2014 at 5:23 PM  Between 7am to 6pm - Pager - [phone]  After 6pm go to [email or web address] - [facility]  Office  [phone]  CC: Primary care Physician: Alvester Chou, NP

## 2014-08-11 NOTE — ED Notes (Signed)
Pt son attempted to visit pt, visitor directed to appropriate hours but haughty and refused info, pt escorted to lobby police notified.

## 2014-08-12 DIAGNOSIS — R531 Weakness: Secondary | ICD-10-CM | POA: Diagnosis not present

## 2014-08-12 MED ORDER — ASPIRIN EC 81 MG PO TBEC
DELAYED_RELEASE_TABLET | ORAL | Status: AC
  Administered 2014-08-12: 81 mg via ORAL
  Filled 2014-08-12: qty 1

## 2014-08-12 MED ORDER — RISPERIDONE 1 MG PO TABS
ORAL_TABLET | ORAL | Status: AC
  Administered 2014-08-12: 0.5 mg via ORAL
  Filled 2014-08-12: qty 1

## 2014-08-12 MED ORDER — FUROSEMIDE 40 MG PO TABS
ORAL_TABLET | ORAL | Status: AC
  Administered 2014-08-12: 40 mg via ORAL
  Filled 2014-08-12: qty 1

## 2014-08-12 MED ORDER — LORAZEPAM 0.5 MG PO TABS
ORAL_TABLET | ORAL | Status: AC
  Administered 2014-08-12: 0.5 mg via ORAL
  Filled 2014-08-12: qty 1

## 2014-08-12 MED ORDER — METOPROLOL SUCCINATE ER 50 MG PO TB24
ORAL_TABLET | ORAL | Status: AC
  Administered 2014-08-12: 50 mg via ORAL
  Filled 2014-08-12: qty 1

## 2014-08-12 NOTE — Progress Notes (Signed)
CSW received PASSAR number for patient.  Call to [facility], spoke to [place] patient will go to room 76A  and call to patient's son, [name]  [phone]    to inform him patient will discharge to [facility].   Son and patient in agreement with plan.  Patient is eager and excited to go to facility CSW informed RN and spoke to MD. Patient will discharge to [facility].  RN to call report to [phone]  [facility] 2.  And EMS will transport.  Casimer Lanius. Latanya Presser, MSW Clinical Social Work Department Emergency Room [phone] 12:13 PM

## 2014-08-12 NOTE — ED Notes (Signed)
BEHAVIORAL HEALTH ROUNDING Patient sleeping: No. Patient alert and oriented: yes Behavior appropriate: Yes.  ; If no, describe:  Nutrition and fluids offered: Yes  Toileting and hygiene offered: Yes  Sitter present: not applicable Law enforcement present: Yes  

## 2014-08-12 NOTE — ED Notes (Signed)
02 removed, pt noted 97% SAT on room air. 02 left off at this time.

## 2014-08-12 NOTE — ED Notes (Signed)
Pt discharged, transported by ambulance to [facility].

## 2014-08-12 NOTE — ED Notes (Signed)
Report to [place] at [facility]. Report to secretary ready to arrange transport.

## 2014-08-12 NOTE — ED Notes (Signed)

## 2014-08-12 NOTE — ED Notes (Signed)
BEHAVIORAL HEALTH ROUNDING Patient sleeping: Yes.   Patient alert and oriented: not applicable SLEEPING Behavior appropriate: Yes.  ; If no, describe: SLEEPING Nutrition and fluids offered: No SLEEPING Toileting and hygiene offered: NoSLEEPING Sitter present: not applicable Law enforcement present: Yes ODS 

## 2014-08-12 NOTE — Clinical Social Work Placement (Signed)
   CLINICAL SOCIAL WORK PLACEMENT  NOTE  Date:  08/12/2014  Patient Details  Name: [name] [name] MRN: [id number] Date of Birth: May 24, 1945  Clinical Social Work is seeking post-discharge placement for this patient at the [place] level of care (*CSW will initial, date and re-position this form in  chart as items are completed):  No   Patient/family provided with [place] Work Department's list of facilities offering this level of care within the geographic area requested by the patient (or if unable, by the patient's family).  Yes   Patient/family informed of their freedom to choose among providers that offer the needed level of care, that participate in Medicare, Medicaid or managed care program needed by the patient, have an available bed and are willing to accept the patient.  Yes   Patient/family informed of [facility]'s ownership interest in [facility] and [facility], as well as of the fact that they are under no obligation to receive care at these facilities.  PASRR submitted to EDS on 08/09/14     PASRR number received on 08/12/14     Existing PASRR number confirmed on       FL2 transmitted to all facilities in geographic area requested by pt/family on 08/09/14     FL2 transmitted to all facilities within larger geographic area on       Patient informed that his/her managed care company has contracts with or will negotiate with certain facilities, including the following:        Yes   Patient/family informed of bed offers received.  Patient chooses bed at [facility]     Physician recommends and patient chooses bed at      Patient to be transferred to [facility] on 08/12/14.  Patient to be transferred to facility by EMS     Patient family notified on 08/12/14 of transfer.  Name of family member notified:  [name] (son)     PHYSICIAN Please prepare priority discharge summary, including medications      Additional Comment:    _______________________________________________ Maurine Cane, LCSW 08/12/2014, 12:15 PM

## 2014-08-12 NOTE — Progress Notes (Signed)
Call to [facility] team [facility] to inform them patient will discharge to [facility].  They will follow up with patient tomorrow at facility.   [name] [name]. Latanya Presser, MSW Clinical Social Work Department Emergency Room [phone] 1:12 PM

## 2014-08-14 LAB — BLOOD GAS, ARTERIAL
Acid-Base Excess: 16.7 mmol/L — ABNORMAL HIGH (ref 0.0–3.0)
Bicarbonate: 45.2 mEq/L — ABNORMAL HIGH (ref 21.0–28.0)
FIO2: 0.21 %
O2 SAT: 84.2 %
PATIENT TEMPERATURE: 37
pCO2 arterial: 73 mmHg (ref 32.0–48.0)
pH, Arterial: 7.4 (ref 7.350–7.450)
pO2, Arterial: 49 mmHg — ABNORMAL LOW (ref 83.0–108.0)

## 2014-08-15 ENCOUNTER — Ambulatory Visit (INDEPENDENT_AMBULATORY_CARE_PROVIDER_SITE_OTHER): Payer: Medicare HMO | Admitting: Cardiovascular Disease

## 2014-08-15 ENCOUNTER — Encounter: Payer: Self-pay | Admitting: Cardiovascular Disease

## 2014-08-15 VITALS — BP 140/72 | HR 83 | Ht 66.5 in | Wt 289.0 lb

## 2014-08-15 DIAGNOSIS — J9600 Acute respiratory failure, unspecified whether with hypoxia or hypercapnia: Secondary | ICD-10-CM

## 2014-08-15 DIAGNOSIS — R079 Chest pain, unspecified: Secondary | ICD-10-CM | POA: Diagnosis not present

## 2014-08-15 DIAGNOSIS — I5032 Chronic diastolic (congestive) heart failure: Secondary | ICD-10-CM | POA: Diagnosis not present

## 2014-08-15 DIAGNOSIS — I10 Essential (primary) hypertension: Secondary | ICD-10-CM | POA: Diagnosis not present

## 2014-08-15 NOTE — Assessment & Plan Note (Signed)
Recommended that she continue on her BiPAP with sleeping overnight and with naps

## 2014-08-15 NOTE — Progress Notes (Signed)
Patient ID: [name] [name], female    DOB: Sep 01, 1945, 69 y.o.   MRN: [id number]  HPI Comments: [name] [name] is a 69 year old woman with obstructive sleep apnea, previously noncompliant on her CPAP or BiPAP, diastolic CHF, ejection fraction 60%, several recent hospital admissions for shortness of breath recent hospital discharge 04/29/2014 with diagnosis of acute hypercapnic respiratory failure and requiring BiPAP to her hospital admission, repeat admission 05/06/2014 for shortness of breath, somnolence. She was also overmedicated, sleepy on her hospital admission at the beginning of March after her son gave her her sleeping pill and she was confused and difficult to arouse. She presents to day for routine follow-up of her chronic diastolic CHF and hypercapnic respiratory failure  She was initially somnolent when I first arrived in the room but was able to communicate well. Overall she reports that she is doing well with no complaints. Legs are getting stronger, she is working with PT. In the room she demonstrates the strength of her legs with leg extensions from a wheelchair. Denies any lower extremity edema, no shortness of breath Previous notes indicate she is using her BiPAP On a regular basis  She is currently living at [facility] . Ativan dosing was decreased on her last clinic visit for somnolence   EKG on today's visit shows normal sinus rhythm with  T-wave abnormality V4 through V6, 1 and aVL   Other past medical history  Lab work from the hospital any 1.08, BUN 23 Previous blood gases in the hospital have shown elevated CO2 levels upwards of 70 or higher BNP 57 With overdiuresis in the hospital creatinine and BUN have climbed with creatinine 1.38, BUN 22 chest x-ray clear. Results were reviewed with her but she was very sleepy      Allergies  Allergen Reactions  . Haldol [Haloperidol Decanoate] Swelling and Other (See Comments)    Tongue swells and blurred vision  .  Metformin Diarrhea  . Raspberry Swelling    Other reaction(s): SWELLINGin lips    Outpatient Encounter Prescriptions as of 05/15/2014  Medication Sig  . acetaminophen (TYLENOL) 325 MG tablet Take 650 mg by mouth every 4 (four) hours as needed.  Marland Kitchen aspirin EC 81 MG tablet Take 81 mg by mouth daily.    Marland Kitchen atorvastatin (LIPITOR) 20 MG tablet Take 20 mg by mouth daily.  . benztropine (COGENTIN) 1 MG tablet Take 1 mg by mouth 2 (two) times daily.  . fluPHENAZine (PROLIXIN) 5 MG tablet Take 10 mg by mouth at bedtime.   . fluPHENAZine decanoate (PROLIXIN) 25 MG/ML injection Inject 50 mg into the muscle every 14 (fourteen) days. Unknown dose. Given at a [facility], states she will now have ACT team coming to her home to administer  . glipiZIDE (GLUCOTROL) 10 MG tablet Take 10 mg by mouth 2 (two) times daily before a meal.  . insulin glargine (LANTUS) 100 UNIT/ML injection Inject 15 Units into the skin at bedtime.  Marland Kitchen lisinopril (PRINIVIL,ZESTRIL) 5 MG tablet Take 5 mg by mouth daily.  Marland Kitchen LORazepam (ATIVAN) 0.5 MG tablet Take 0.5 mg by mouth every 8 (eight) hours.  Marland Kitchen oxybutynin (DITROPAN-XL) 10 MG 24 hr tablet Take 10 mg by mouth every morning.  . predniSONE (DELTASONE) 10 MG tablet Take 10 mg by mouth daily with breakfast.  . ranitidine (ZANTAC) 150 MG tablet Take 150 mg by mouth daily.    . sitaGLIPtin (JANUVIA) 100 MG tablet Take 100 mg by mouth daily.  . traZODone (DESYREL)  100 MG tablet Take 100 mg by mouth at bedtime.  . verapamil (CALAN) 40 MG tablet Take 40 mg by mouth 3 (three) times daily.    Past Medical History  Diagnosis Date  . DM2 (diabetes mellitus, type 2)   . Hypertension   . Hypercholesteremia   . Schizophrenia   . Osteoarthritis   . Bursitis   . OSA (obstructive sleep apnea)     does not use machine  . Left ventricular outflow tract obstruction     a. echo 03/2014: EF 60-65%, hypernamic LV systolic fxn, mod LVH w/ LVOT gradient estimated at 68 mm Hg w/  valsalva, very small LV internal cavity size, mildly increased LV posterior wall thickness, mild Ao valve scl w/o stenosis, diastolic dysfunction, normal RVSP  . LVH (left ventricular hypertrophy)     a. echo suggests long standing uncontrolled htn. she will not do well when dehydrated, LV cavity obliteration  . Obesity   . CHF (congestive heart failure)   . Anxiety   . COPD (chronic obstructive pulmonary disease)     Past Surgical History  Procedure Laterality Date  . Tonsillectomy    . Knee reconstruction, medial patellar femoral ligament    . Cholecystectomy    . Colonoscopy  04/2011    [facility] per patient incomplete    Social History  reports that she has quit smoking. She has never used smokeless tobacco. She reports that she does not drink alcohol or use illicit drugs.  Family History family history includes Heart attack in her mother. There is no history of Colon cancer or Liver disease.   Review of Systems  HENT: Negative.   Eyes: Negative.   Cardiovascular: Negative.   Gastrointestinal: Negative.   Musculoskeletal: Negative.   Skin: Negative.   Neurological: Negative.   Hematological: Negative.   Psychiatric/Behavioral: Negative.   All other systems reviewed and are negative.  BP 140/72 mmHg  Pulse 83  Ht 5' 6.5" (1.689 m)  Wt 289 lb (131.09 kg)  BMI 45.95 kg/m2  Physical Exam  Constitutional: She is oriented to person, place, and time. She appears well-developed and well-nourished.  HENT:  Head: Normocephalic.  Nose: Nose normal.  Mouth/Throat: Oropharynx is clear and moist.  Eyes: Conjunctivae are normal. Pupils are equal, round, and reactive to light.  Neck: Normal range of motion. Neck supple. No JVD present.  Cardiovascular: Normal rate, regular rhythm, S1 normal, S2 normal, normal heart sounds and intact distal pulses.  Exam reveals no gallop and no friction rub.   No murmur heard. Pulmonary/Chest: Effort normal and breath sounds normal. No respiratory  distress. She has no wheezes. She has no rales. She exhibits no tenderness.  Abdominal: Soft. Bowel sounds are normal. She exhibits no distension. There is no tenderness.  Musculoskeletal: Normal range of motion. She exhibits no edema or tenderness.  Lymphadenopathy:    She has no cervical adenopathy.  Neurological: She is alert and oriented to person, place, and time. Coordination normal.  Skin: Skin is warm and dry. No rash noted. No erythema.  Psychiatric: She has a normal mood and affect. Her behavior is normal. Judgment and thought content normal.    Assessment and Plan  Nursing note and vitals reviewed.

## 2014-08-15 NOTE — Assessment & Plan Note (Signed)
Appears relatively euvolemic on today's visit. No changes to her medications. We'll continue Lasix 40 mg daily

## 2014-08-15 NOTE — Patient Instructions (Signed)
You are doing well. No medication changes were made.  Please call us if you have new issues that need to be addressed before your next appt.  Your physician wants you to follow-up in: 6 months.  You will receive a reminder letter in the mail two months in advance. If you don't receive a letter, please call our office to schedule the follow-up appointment.   

## 2014-08-15 NOTE — Assessment & Plan Note (Signed)
Blood pressure is well controlled on today's visit. No changes made to the medications. 

## 2014-08-29 ENCOUNTER — Encounter
Admission: RE | Admit: 2014-08-29 | Discharge: 2014-08-29 | Disposition: A | Discharge status: Home / Self Care | Payer: Medicare HMO | Source: Ambulatory Visit | Attending: Ophthalmology | Admitting: Ophthalmology

## 2014-08-29 DIAGNOSIS — I10 Essential (primary) hypertension: Secondary | ICD-10-CM

## 2014-08-29 DIAGNOSIS — Z01812 Encounter for preprocedural laboratory examination: Secondary | ICD-10-CM | POA: Insufficient documentation

## 2014-08-29 DIAGNOSIS — H2512 Age-related nuclear cataract, left eye: Secondary | ICD-10-CM | POA: Diagnosis not present

## 2014-08-29 DIAGNOSIS — Z01810 Encounter for preprocedural cardiovascular examination: Secondary | ICD-10-CM | POA: Insufficient documentation

## 2014-08-29 DIAGNOSIS — I2510 Atherosclerotic heart disease of native coronary artery without angina pectoris: Secondary | ICD-10-CM | POA: Diagnosis not present

## 2014-08-29 LAB — POTASSIUM: POTASSIUM: 4.2 mmol/L (ref 3.5–5.1)

## 2014-09-02 ENCOUNTER — Encounter: Payer: Self-pay | Admitting: *Deleted

## 2014-09-03 NOTE — Pre-Procedure Instructions (Signed)
EKG had been sent to anesthesia for review, per Dr. Laureen Abrahams "OK to Proceed".

## 2014-09-10 ENCOUNTER — Ambulatory Visit: Payer: Medicare HMO | Admitting: Anesthesiology

## 2014-09-10 ENCOUNTER — Encounter
Admission: RE | Disposition: A | Discharge status: Home / Self Care | Payer: Self-pay | Source: Ambulatory Visit | Attending: Ophthalmology

## 2014-09-10 ENCOUNTER — Encounter: Payer: Self-pay | Admitting: Anesthesiology

## 2014-09-10 ENCOUNTER — Ambulatory Visit
Admission: RE | Admit: 2014-09-10 | Discharge: 2014-09-10 | Disposition: A | Discharge status: Home / Self Care | Payer: Medicare HMO | Source: Ambulatory Visit | Attending: Ophthalmology | Admitting: Ophthalmology

## 2014-09-10 DIAGNOSIS — E785 Hyperlipidemia, unspecified: Secondary | ICD-10-CM | POA: Diagnosis not present

## 2014-09-10 DIAGNOSIS — M7989 Other specified soft tissue disorders: Secondary | ICD-10-CM | POA: Insufficient documentation

## 2014-09-10 DIAGNOSIS — R05 Cough: Secondary | ICD-10-CM | POA: Diagnosis not present

## 2014-09-10 DIAGNOSIS — R251 Tremor, unspecified: Secondary | ICD-10-CM | POA: Diagnosis not present

## 2014-09-10 DIAGNOSIS — I509 Heart failure, unspecified: Secondary | ICD-10-CM | POA: Diagnosis not present

## 2014-09-10 DIAGNOSIS — E119 Type 2 diabetes mellitus without complications: Secondary | ICD-10-CM | POA: Insufficient documentation

## 2014-09-10 DIAGNOSIS — N289 Disorder of kidney and ureter, unspecified: Secondary | ICD-10-CM | POA: Insufficient documentation

## 2014-09-10 DIAGNOSIS — M1990 Unspecified osteoarthritis, unspecified site: Secondary | ICD-10-CM | POA: Diagnosis not present

## 2014-09-10 DIAGNOSIS — F329 Major depressive disorder, single episode, unspecified: Secondary | ICD-10-CM | POA: Diagnosis not present

## 2014-09-10 DIAGNOSIS — H2512 Age-related nuclear cataract, left eye: Secondary | ICD-10-CM | POA: Diagnosis present

## 2014-09-10 DIAGNOSIS — Z96659 Presence of unspecified artificial knee joint: Secondary | ICD-10-CM | POA: Insufficient documentation

## 2014-09-10 DIAGNOSIS — R062 Wheezing: Secondary | ICD-10-CM | POA: Diagnosis not present

## 2014-09-10 DIAGNOSIS — I2510 Atherosclerotic heart disease of native coronary artery without angina pectoris: Secondary | ICD-10-CM | POA: Diagnosis not present

## 2014-09-10 DIAGNOSIS — Z888 Allergy status to other drugs, medicaments and biological substances status: Secondary | ICD-10-CM | POA: Insufficient documentation

## 2014-09-10 DIAGNOSIS — Z9049 Acquired absence of other specified parts of digestive tract: Secondary | ICD-10-CM | POA: Diagnosis not present

## 2014-09-10 DIAGNOSIS — M6281 Muscle weakness (generalized): Secondary | ICD-10-CM | POA: Insufficient documentation

## 2014-09-10 DIAGNOSIS — I10 Essential (primary) hypertension: Secondary | ICD-10-CM | POA: Diagnosis not present

## 2014-09-10 DIAGNOSIS — F209 Schizophrenia, unspecified: Secondary | ICD-10-CM | POA: Diagnosis not present

## 2014-09-10 DIAGNOSIS — Z87891 Personal history of nicotine dependence: Secondary | ICD-10-CM | POA: Insufficient documentation

## 2014-09-10 DIAGNOSIS — J449 Chronic obstructive pulmonary disease, unspecified: Secondary | ICD-10-CM | POA: Diagnosis not present

## 2014-09-10 DIAGNOSIS — G4730 Sleep apnea, unspecified: Secondary | ICD-10-CM | POA: Insufficient documentation

## 2014-09-10 DIAGNOSIS — Z9981 Dependence on supplemental oxygen: Secondary | ICD-10-CM | POA: Insufficient documentation

## 2014-09-10 HISTORY — DX: Chronic cough: R05.3

## 2014-09-10 HISTORY — DX: Unspecified osteoarthritis, unspecified site: M19.90

## 2014-09-10 HISTORY — DX: Other allergy status, other than to drugs and biological substances: Z91.09

## 2014-09-10 HISTORY — DX: Atherosclerotic heart disease of native coronary artery without angina pectoris: I25.10

## 2014-09-10 HISTORY — DX: Bronchitis, not specified as acute or chronic: J40

## 2014-09-10 HISTORY — DX: Gastro-esophageal reflux disease without esophagitis: K21.9

## 2014-09-10 HISTORY — DX: Muscle weakness (generalized): M62.81

## 2014-09-10 HISTORY — PX: CATARACT EXTRACTION W/PHACO: SHX586

## 2014-09-10 HISTORY — DX: Chronic kidney disease, unspecified: N18.9

## 2014-09-10 HISTORY — DX: Depression, unspecified: F32.A

## 2014-09-10 HISTORY — DX: Wheezing: R06.2

## 2014-09-10 HISTORY — DX: Tremor, unspecified: R25.1

## 2014-09-10 HISTORY — DX: Localized edema: R60.0

## 2014-09-10 HISTORY — DX: Cough: R05

## 2014-09-10 HISTORY — DX: Major depressive disorder, single episode, unspecified: F32.9

## 2014-09-10 LAB — GLUCOSE, CAPILLARY: Glucose-Capillary: 122 mg/dL — ABNORMAL HIGH (ref 65–99)

## 2014-09-10 SURGERY — PHACOEMULSIFICATION, CATARACT, WITH IOL INSERTION
Anesthesia: Monitor Anesthesia Care | Site: Eye | Laterality: Left | Wound class: Clean

## 2014-09-10 MED ORDER — MOXIFLOXACIN HCL 0.5 % OP SOLN
OPHTHALMIC | Status: DC | PRN
  Administered 2014-09-10: 1 [drp] via OPHTHALMIC

## 2014-09-10 MED ORDER — TETRACAINE HCL 0.5 % OP SOLN
OPHTHALMIC | Status: AC
Start: 2014-09-10 — End: 2014-09-10
  Administered 2014-09-10: 1 [drp] via OPHTHALMIC
  Filled 2014-09-10: qty 2

## 2014-09-10 MED ORDER — SODIUM CHLORIDE 0.9 % IV SOLN
INTRAVENOUS | Status: DC
  Administered 2014-09-10: 07:00:00 via INTRAVENOUS

## 2014-09-10 MED ORDER — EPINEPHRINE HCL 1 MG/ML IJ SOLN
INTRAMUSCULAR | Status: DC | PRN
  Administered 2014-09-10: 200 mL

## 2014-09-10 MED ORDER — TETRACAINE HCL 0.5 % OP SOLN
1.0000 [drp] | Freq: Once | OPHTHALMIC | Status: AC
Start: 2014-09-10 — End: 2014-09-10
  Administered 2014-09-10: 1 [drp] via OPHTHALMIC

## 2014-09-10 MED ORDER — TETRACAINE 0.5 % OP SOLN OPTIME - NO CHARGE
OPHTHALMIC | Status: DC | PRN
  Administered 2014-09-10: 2 [drp] via OPHTHALMIC

## 2014-09-10 MED ORDER — NA CHONDROIT SULF-NA HYALURON 40-17 MG/ML IO SOLN
INTRAOCULAR | Status: AC
  Filled 2014-09-10: qty 1

## 2014-09-10 MED ORDER — LIDOCAINE HCL (PF) 4 % IJ SOLN
INTRAOCULAR | Status: DC | PRN
  Administered 2014-09-10: 4 mL via OPHTHALMIC

## 2014-09-10 MED ORDER — POVIDONE-IODINE 5 % OP SOLN
OPHTHALMIC | Status: AC
  Administered 2014-09-10: 1 via OPHTHALMIC
  Filled 2014-09-10: qty 30

## 2014-09-10 MED ORDER — LIDOCAINE HCL (PF) 4 % IJ SOLN
INTRAMUSCULAR | Status: AC
  Filled 2014-09-10: qty 5

## 2014-09-10 MED ORDER — ARMC OPHTHALMIC DILATING GEL
1.0000 "application " | OPHTHALMIC | Status: AC
  Administered 2014-09-10: 07:00:00 via OPHTHALMIC

## 2014-09-10 MED ORDER — POVIDONE-IODINE 5 % OP SOLN
1.0000 "application " | Freq: Once | OPHTHALMIC | Status: AC
  Administered 2014-09-10: 1 via OPHTHALMIC

## 2014-09-10 MED ORDER — CARBACHOL 0.01 % IO SOLN
INTRAOCULAR | Status: DC | PRN
  Administered 2014-09-10: .1 mL via INTRAOCULAR

## 2014-09-10 MED ORDER — MOXIFLOXACIN HCL 0.5 % OP SOLN
OPHTHALMIC | Status: AC
  Filled 2014-09-10: qty 3

## 2014-09-10 MED ORDER — CEFUROXIME OPHTHALMIC INJECTION 1 MG/0.1 ML
INJECTION | OPHTHALMIC | Status: AC
  Filled 2014-09-10: qty 0.1

## 2014-09-10 MED ORDER — CEFUROXIME OPHTHALMIC INJECTION 1 MG/0.1 ML
INJECTION | OPHTHALMIC | Status: DC | PRN
  Administered 2014-09-10: 0.1 mL via INTRACAMERAL

## 2014-09-10 MED ORDER — ARMC OPHTHALMIC DILATING GEL
OPHTHALMIC | Status: AC
  Filled 2014-09-10: qty 0.25

## 2014-09-10 MED ORDER — MOXIFLOXACIN HCL 0.5 % OP SOLN: 1.0000 [drp] | OPHTHALMIC | Status: DC | PRN

## 2014-09-10 MED ORDER — EPINEPHRINE HCL 1 MG/ML IJ SOLN
INTRAMUSCULAR | Status: AC
  Filled 2014-09-10: qty 1

## 2014-09-10 SURGICAL SUPPLY — 23 items
CANNULA ANT/CHMB 27G (MISCELLANEOUS) ×1 IMPLANT
CANNULA ANT/CHMB 27GA (MISCELLANEOUS) ×6 IMPLANT
CUP MEDICINE 2OZ PLAST GRAD ST (MISCELLANEOUS) ×2 IMPLANT
GLOVE BIO SURGEON STRL SZ8 (GLOVE) ×3 IMPLANT
GLOVE BIOGEL M 6.5 STRL (GLOVE) ×3 IMPLANT
GLOVE SURG LX 8.0 MICRO (GLOVE) ×2
GLOVE SURG LX STRL 8.0 MICRO (GLOVE) ×1 IMPLANT
GOWN STRL REUS W/ TWL LRG LVL3 (GOWN DISPOSABLE) ×2 IMPLANT
GOWN STRL REUS W/TWL LRG LVL3 (GOWN DISPOSABLE) ×6
LENS IOL TECNIS 23.5 (Intraocular Lens) ×3 IMPLANT
LENS IOL TECNIS MONO 1P 23.5 (Intraocular Lens) IMPLANT
PACK CATARACT (MISCELLANEOUS) ×3 IMPLANT
PACK CATARACT BRASINGTON LX (MISCELLANEOUS) ×3 IMPLANT
PACK EYE AFTER SURG (MISCELLANEOUS) ×3 IMPLANT
SOL BSS BAG (MISCELLANEOUS) ×3
SOL PREP PVP 2OZ (MISCELLANEOUS) ×3
SOLUTION BSS BAG (MISCELLANEOUS) ×1 IMPLANT
SOLUTION PREP PVP 2OZ (MISCELLANEOUS) ×1 IMPLANT
SYR 3ML LL SCALE MARK (SYRINGE) ×2 IMPLANT
SYR 5ML LL (SYRINGE) ×3 IMPLANT
SYR TB 1ML 27GX1/2 LL (SYRINGE) ×3 IMPLANT
WATER STERILE IRR 1000ML POUR (IV SOLUTION) ×3 IMPLANT
WIPE NON LINTING 3.25X3.25 (MISCELLANEOUS) ×3 IMPLANT

## 2014-09-10 NOTE — Anesthesia Preprocedure Evaluation (Addendum)
Anesthesia Evaluation  Patient identified by MRN, date of birth, ID band Patient awake    Reviewed: Allergy & Precautions, NPO status , Patient's Chart, lab work & pertinent test results, reviewed documented beta blocker date and time   Airway Mallampati: III  TM Distance: >3 FB     Dental  (+) Chipped   Pulmonary sleep apnea , COPDformer smoker,          Cardiovascular hypertension, + CAD and +CHF     Neuro/Psych PSYCHIATRIC DISORDERS Anxiety Depression Schizophrenia    GI/Hepatic GERD-  ,  Endo/Other  diabetes  Renal/GU Renal disease     Musculoskeletal  (+) Arthritis -,   Abdominal   Peds  Hematology   Anesthesia Other Findings Obesity. Does not use CPAP. Overbite. Poor dentition.  Reproductive/Obstetrics                            Anesthesia Physical Anesthesia Plan  ASA: III  Anesthesia Plan: MAC   Post-op Pain Management:    Induction:   Airway Management Planned:   Additional Equipment:   Intra-op Plan:   Post-operative Plan:   Informed Consent: I have reviewed the patients History and Physical, chart, labs and discussed the procedure including the risks, benefits and alternatives for the proposed anesthesia with the patient or authorized representative who has indicated his/her understanding and acceptance.     Plan Discussed with: CRNA  Anesthesia Plan Comments:         Anesthesia Quick Evaluation

## 2014-09-10 NOTE — Anesthesia Postprocedure Evaluation (Signed)
  Anesthesia Post-op Note  Patient: [name] [name]  Procedure(s) Performed: Procedure(s) with comments: CATARACT EXTRACTION PHACO AND INTRAOCULAR LENS PLACEMENT (IOC) (Left) - [place]: 00:44 AP%: 22.8 CDE: 10.24 Fluid lot #[id number]  Anesthesia type:MAC  Patient location: PACU  Post pain: Pain level controlled  Post assessment: Post-op Vital signs reviewed, Patient's Cardiovascular Status Stable, Respiratory Function Stable, Patent Airway and No signs of Nausea or vomiting  Post vital signs: Reviewed and stable  Last Vitals:  Filed Vitals:   09/10/14 0845  BP: 156/87  Pulse: 89  Temp: 36.5 C  Resp: 20    Level of consciousness: awake, alert  and patient cooperative  Complications: No apparent anesthesia complications

## 2014-09-10 NOTE — Discharge Instructions (Signed)
AMBULATORY SURGERY  °DISCHARGE INSTRUCTIONS ° ° °1) The drugs that you were given will stay in your system until tomorrow so for the next 24 hours you should not: ° °A) Drive an automobile °B) Make any legal decisions °C) Drink any alcoholic beverage ° ° °2) You may resume regular meals tomorrow.  Today it is better to start with liquids and gradually work up to solid foods. ° °You may eat anything you prefer, but it is better to start with liquids, then soup and crackers, and gradually work up to solid foods. ° ° °3) Please notify your doctor immediately if you have any unusual bleeding, trouble breathing, redness and pain at the surgery site, drainage, fever, or pain not relieved by medication. ° ° ° °4) Additional Instructions: ° ° ° °Cataract Surgery °Care After °Refer to this sheet in the next few weeks. These instructions provide you with information on caring for yourself after your procedure. Your caregiver may also give you more specific instructions. Your treatment has been planned according to current medical practices, but problems sometimes occur. Call your caregiver if you have any problems or questions after your procedure.  °HOME CARE INSTRUCTIONS  °· Avoid strenuous activities as directed by your caregiver. °· Ask your caregiver when you can resume driving. °· Use eyedrops or other medicines to help healing and control pressure inside your eye as directed by your caregiver. °· Only take over-the-counter or prescription medicines for pain, discomfort, or fever as directed by your caregiver. °· Do not to touch or rub your eyes. °· You may be instructed to use a protective shield during the first few days and nights after surgery. If not, wear sunglasses to protect your eyes. This is to protect the eye from pressure or from being accidentally bumped. °· Keep the area around your eye clean and dry. Avoid swimming or allowing water to hit you directly in the face while showering. Keep soap and shampoo  out of your eyes. °· Do not bend or lift heavy objects. Bending increases pressure in the eye. You can walk, climb stairs, and do light household chores. °· Do not put a contact lens into the eye that had surgery until your caregiver says it is okay to do so. °· Ask your doctor when you can return to work. This will depend on the kind of work that you do. If you work in a dusty environment, you may be advised to wear protective eyewear for a period of time. °· Ask your caregiver when it will be safe to engage in sexual activity. °· Continue with your regular eye exams as directed by your caregiver. °What to expect: °· It is normal to feel itching and mild discomfort for a few days after cataract surgery. Some fluid discharge is also common, and your eye may be sensitive to light and touch. °· After 1 to 2 days, even moderate discomfort should disappear. In most cases, healing will take about 6 weeks. °· If you received an intraocular lens (IOL), you may notice that colors are very bright or have a blue tinge. Also, if you have been in bright sunlight, everything may appear reddish for a few hours. If you see these color tinges, it is because your lens is clear and no longer cloudy. Within a few months after receiving an IOL, these extra colors should go away. When you have healed, you will probably need new glasses. °SEEK MEDICAL CARE IF:  °· You have increased bruising around your   eye. °· You have discomfort not helped by medicine. °SEEK IMMEDIATE MEDICAL CARE IF:  °· You have a  fever. °· You have a worsening or sudden vision loss. °· You have redness, swelling, or increasing pain in the eye. °· You have a thick discharge from the eye that had surgery. °MAKE SURE YOU: °· Understand these instructions. °· Will watch your condition. °· Will get help right away if you are not doing well or get worse. °Document Released: 08/28/2004 Document Revised: 05/03/2011 Document Reviewed: 10/02/2010 °ExitCare® Patient  Information ©2015 [employer]. This information is not intended to replace advice given to you by your health care provider. Make sure you discuss any questions you have with your health care provider. ° ° ° ° °Please contact your physician with any problems or Same Day Surgery at [phone], Monday through Friday 6 am to 4 pm, or [place] [facility] number at [phone]. °

## 2014-09-10 NOTE — Op Note (Signed)
PREOPERATIVE DIAGNOSIS:  Nuclear sclerotic cataract of the left eye.   POSTOPERATIVE DIAGNOSIS:  Nuclear sclerotic cataract Left   OPERATIVE PROCEDURE:  Procedure(s): CATARACT EXTRACTION PHACO AND INTRAOCULAR LENS PLACEMENT (IOC)   SURGEON:  Birder Robson, MD.   ANESTHESIA:   Anesthesiologist: Gunnar Bulla, MD CRNA: Bernardo Heater, CRNA  1.      Managed anesthesia care. 2.      Topical tetracaine drops followed by 2% Xylocaine jelly applied in the preoperative holding area.       3.   0.2 ml of epi-Shugarcaine was  placed in the anterior chamber following the paracentesis.    COMPLICATIONS:  None.   TECHNIQUE:   Stop and chop   DESCRIPTION OF PROCEDURE:  The patient was examined and consented in the preoperative holding area where the aforementioned topical anesthesia was applied to the left eye and then brought back to the Operating Room where the left eye was prepped and draped in the usual sterile ophthalmic fashion and a lid speculum was placed. A paracentesis was created with the side port blade and the anterior chamber was filled with viscoelastic. A near clear corneal incision was performed with the steel keratome. A continuous curvilinear capsulorrhexis was performed with a cystotome followed by the capsulorrhexis forceps. Hydrodissection and hydrodelineation were carried out with BSS on a blunt cannula. The lens was removed in a stop and chop  technique and the remaining cortical material was removed with the irrigation-aspiration handpiece. The capsular bag was inflated with viscoelastic and the Technis ZCB00 lens was placed in the capsular bag without complication. The remaining viscoelastic was removed from the eye with the irrigation-aspiration handpiece. The wounds were hydrated. The anterior chamber was flushed with Miostat and the eye was inflated to physiologic pressure. 0.1 mL of cefuroxime concentration 10 mg/mL was placed in the anterior chamber. The wounds were found to  be water tight. The eye was dressed with Vigamox. The patient was given protective glasses to wear throughout the day and a shield with which to sleep tonight. The patient was also given drops with which to begin a drop regimen today and will follow-up with me in one day.  Implant Name Type Inv. Item Serial No. Manufacturer Lot No. LRB No. Used  LENS IMPL INTRAOC ZCB00 23.5 - [id number] Intraocular Lens LENS IMPL INTRAOC ZCB00 23.5 [id number] AMO   Left 1   Procedure(s) with comments: CATARACT EXTRACTION PHACO AND INTRAOCULAR LENS PLACEMENT (IOC) (Left) - [place]: 00:44 AP%: 22.8 CDE: 10.24 Fluid lot #[id number]  Electronically signed: [place] 09/10/2014 8:42 AM

## 2014-09-10 NOTE — H&P (Signed)
All labs reviewed. Abnormal studies sent to patients PCP when indicated.  Previous H&P reviewed, patient examined, there are NO CHANGES.  [name] [name] LOUIS7/19/20168:13 AM

## 2014-09-10 NOTE — Transfer of Care (Signed)
Immediate Anesthesia Transfer of Care Note  Patient: [name] [name]  Procedure(s) Performed: Procedure(s) with comments: CATARACT EXTRACTION PHACO AND INTRAOCULAR LENS PLACEMENT (IOC) (Left) - [place]: 00:44 AP%: 22.8 CDE: 10.24 Fluid lot #[id number] H  Patient Location: PACU  Anesthesia Type:MAC  Level of Consciousness: awake, alert , oriented and patient cooperative  Airway & Oxygen Therapy: Patient Spontanous Breathing and Patient connected to nasal cannula oxygen  Post-op Assessment: Report given to RN and Post -op Vital signs reviewed and stable  Post vital signs: Reviewed and stable  Last Vitals:  Filed Vitals:   09/10/14 0611  BP: 124/79  Pulse: 77  Temp: 36.9 C  Resp: 16    Complications: No apparent anesthesia complications

## 2014-10-10 ENCOUNTER — Encounter: Payer: Self-pay | Admitting: *Deleted

## 2014-10-10 DIAGNOSIS — E119 Type 2 diabetes mellitus without complications: Secondary | ICD-10-CM | POA: Diagnosis not present

## 2014-10-10 DIAGNOSIS — Z9842 Cataract extraction status, left eye: Secondary | ICD-10-CM | POA: Diagnosis not present

## 2014-10-10 DIAGNOSIS — I509 Heart failure, unspecified: Secondary | ICD-10-CM | POA: Diagnosis not present

## 2014-10-10 DIAGNOSIS — J449 Chronic obstructive pulmonary disease, unspecified: Secondary | ICD-10-CM | POA: Diagnosis not present

## 2014-10-10 DIAGNOSIS — Z9049 Acquired absence of other specified parts of digestive tract: Secondary | ICD-10-CM | POA: Diagnosis not present

## 2014-10-10 DIAGNOSIS — R05 Cough: Secondary | ICD-10-CM | POA: Diagnosis not present

## 2014-10-10 DIAGNOSIS — K219 Gastro-esophageal reflux disease without esophagitis: Secondary | ICD-10-CM | POA: Diagnosis not present

## 2014-10-10 DIAGNOSIS — F329 Major depressive disorder, single episode, unspecified: Secondary | ICD-10-CM | POA: Diagnosis not present

## 2014-10-10 DIAGNOSIS — Z96659 Presence of unspecified artificial knee joint: Secondary | ICD-10-CM | POA: Diagnosis not present

## 2014-10-10 DIAGNOSIS — F209 Schizophrenia, unspecified: Secondary | ICD-10-CM | POA: Diagnosis not present

## 2014-10-10 DIAGNOSIS — H2511 Age-related nuclear cataract, right eye: Secondary | ICD-10-CM | POA: Diagnosis present

## 2014-10-10 DIAGNOSIS — R062 Wheezing: Secondary | ICD-10-CM | POA: Diagnosis not present

## 2014-10-10 DIAGNOSIS — E785 Hyperlipidemia, unspecified: Secondary | ICD-10-CM | POA: Diagnosis not present

## 2014-10-10 DIAGNOSIS — M6281 Muscle weakness (generalized): Secondary | ICD-10-CM | POA: Diagnosis not present

## 2014-10-10 DIAGNOSIS — M7989 Other specified soft tissue disorders: Secondary | ICD-10-CM | POA: Diagnosis not present

## 2014-10-10 DIAGNOSIS — M1990 Unspecified osteoarthritis, unspecified site: Secondary | ICD-10-CM | POA: Diagnosis not present

## 2014-10-10 DIAGNOSIS — I10 Essential (primary) hypertension: Secondary | ICD-10-CM | POA: Diagnosis not present

## 2014-10-10 DIAGNOSIS — Z888 Allergy status to other drugs, medicaments and biological substances status: Secondary | ICD-10-CM | POA: Diagnosis not present

## 2014-10-10 DIAGNOSIS — M479 Spondylosis, unspecified: Secondary | ICD-10-CM | POA: Diagnosis not present

## 2014-10-10 DIAGNOSIS — Z87891 Personal history of nicotine dependence: Secondary | ICD-10-CM | POA: Diagnosis not present

## 2014-10-10 DIAGNOSIS — I2510 Atherosclerotic heart disease of native coronary artery without angina pectoris: Secondary | ICD-10-CM | POA: Diagnosis not present

## 2014-10-10 DIAGNOSIS — G4730 Sleep apnea, unspecified: Secondary | ICD-10-CM | POA: Diagnosis not present

## 2014-10-10 DIAGNOSIS — R251 Tremor, unspecified: Secondary | ICD-10-CM | POA: Diagnosis not present

## 2014-10-10 NOTE — OR Nursing (Signed)
CLEARED BY DR Brynda Greathouse 08/30/14 FOR FIRST EYE SURGERY

## 2014-10-15 ENCOUNTER — Encounter
Admission: RE | Disposition: A | Discharge status: Skilled Nursing Facility | Payer: Self-pay | Source: Ambulatory Visit | Attending: Ophthalmology

## 2014-10-15 ENCOUNTER — Ambulatory Visit: Payer: Medicare HMO | Admitting: Anesthesiology

## 2014-10-15 ENCOUNTER — Ambulatory Visit
Admission: RE | Admit: 2014-10-15 | Discharge: 2014-10-15 | Disposition: A | Discharge status: Skilled Nursing Facility | Payer: Medicare HMO | Source: Ambulatory Visit | Attending: Ophthalmology | Admitting: Ophthalmology

## 2014-10-15 ENCOUNTER — Encounter: Payer: Self-pay | Admitting: *Deleted

## 2014-10-15 DIAGNOSIS — F209 Schizophrenia, unspecified: Secondary | ICD-10-CM | POA: Insufficient documentation

## 2014-10-15 DIAGNOSIS — H2511 Age-related nuclear cataract, right eye: Secondary | ICD-10-CM | POA: Diagnosis not present

## 2014-10-15 DIAGNOSIS — M1990 Unspecified osteoarthritis, unspecified site: Secondary | ICD-10-CM | POA: Insufficient documentation

## 2014-10-15 DIAGNOSIS — I2510 Atherosclerotic heart disease of native coronary artery without angina pectoris: Secondary | ICD-10-CM | POA: Insufficient documentation

## 2014-10-15 DIAGNOSIS — Z96659 Presence of unspecified artificial knee joint: Secondary | ICD-10-CM | POA: Insufficient documentation

## 2014-10-15 DIAGNOSIS — R062 Wheezing: Secondary | ICD-10-CM | POA: Insufficient documentation

## 2014-10-15 DIAGNOSIS — I509 Heart failure, unspecified: Secondary | ICD-10-CM | POA: Insufficient documentation

## 2014-10-15 DIAGNOSIS — K219 Gastro-esophageal reflux disease without esophagitis: Secondary | ICD-10-CM | POA: Insufficient documentation

## 2014-10-15 DIAGNOSIS — M479 Spondylosis, unspecified: Secondary | ICD-10-CM | POA: Insufficient documentation

## 2014-10-15 DIAGNOSIS — Z87891 Personal history of nicotine dependence: Secondary | ICD-10-CM | POA: Insufficient documentation

## 2014-10-15 DIAGNOSIS — G4730 Sleep apnea, unspecified: Secondary | ICD-10-CM | POA: Insufficient documentation

## 2014-10-15 DIAGNOSIS — M7989 Other specified soft tissue disorders: Secondary | ICD-10-CM | POA: Insufficient documentation

## 2014-10-15 DIAGNOSIS — R05 Cough: Secondary | ICD-10-CM | POA: Insufficient documentation

## 2014-10-15 DIAGNOSIS — E119 Type 2 diabetes mellitus without complications: Secondary | ICD-10-CM | POA: Insufficient documentation

## 2014-10-15 DIAGNOSIS — Z9842 Cataract extraction status, left eye: Secondary | ICD-10-CM | POA: Insufficient documentation

## 2014-10-15 DIAGNOSIS — R251 Tremor, unspecified: Secondary | ICD-10-CM | POA: Insufficient documentation

## 2014-10-15 DIAGNOSIS — J449 Chronic obstructive pulmonary disease, unspecified: Secondary | ICD-10-CM | POA: Insufficient documentation

## 2014-10-15 DIAGNOSIS — M6281 Muscle weakness (generalized): Secondary | ICD-10-CM | POA: Insufficient documentation

## 2014-10-15 DIAGNOSIS — E785 Hyperlipidemia, unspecified: Secondary | ICD-10-CM | POA: Insufficient documentation

## 2014-10-15 DIAGNOSIS — Z9049 Acquired absence of other specified parts of digestive tract: Secondary | ICD-10-CM | POA: Insufficient documentation

## 2014-10-15 DIAGNOSIS — F329 Major depressive disorder, single episode, unspecified: Secondary | ICD-10-CM | POA: Insufficient documentation

## 2014-10-15 DIAGNOSIS — I10 Essential (primary) hypertension: Secondary | ICD-10-CM | POA: Insufficient documentation

## 2014-10-15 DIAGNOSIS — Z888 Allergy status to other drugs, medicaments and biological substances status: Secondary | ICD-10-CM | POA: Insufficient documentation

## 2014-10-15 HISTORY — DX: Dependence on supplemental oxygen: Z99.81

## 2014-10-15 HISTORY — PX: CATARACT EXTRACTION W/PHACO: SHX586

## 2014-10-15 LAB — POTASSIUM: Potassium: 4.3 mmol/L

## 2014-10-15 LAB — GLUCOSE, CAPILLARY: Glucose-Capillary: 131 mg/dL — ABNORMAL HIGH (ref 65–99)

## 2014-10-15 SURGERY — PHACOEMULSIFICATION, CATARACT, WITH IOL INSERTION
Anesthesia: Monitor Anesthesia Care | Laterality: Right | Wound class: Clean

## 2014-10-15 MED ORDER — CEFUROXIME OPHTHALMIC INJECTION 1 MG/0.1 ML
INJECTION | OPHTHALMIC | Status: DC | PRN
  Administered 2014-10-15: 0.1 mL via INTRACAMERAL

## 2014-10-15 MED ORDER — ARMC OPHTHALMIC DILATING GEL
1.0000 | OPHTHALMIC | Status: DC | PRN
Start: 2014-10-15 — End: 2014-10-15
  Administered 2014-10-15: 1 via OPHTHALMIC

## 2014-10-15 MED ORDER — POVIDONE-IODINE 5 % OP SOLN
OPHTHALMIC | Status: AC
  Administered 2014-10-15: 1 via OPHTHALMIC
  Filled 2014-10-15: qty 30

## 2014-10-15 MED ORDER — METOPROLOL SUCCINATE ER 50 MG PO TB24
50.0000 mg | ORAL_TABLET | Freq: Once | ORAL | Status: AC
  Administered 2014-10-15: 50 mg via ORAL
  Filled 2014-10-15 (×2): qty 1

## 2014-10-15 MED ORDER — MOXIFLOXACIN HCL 0.5 % OP SOLN: 1.0000 [drp] | OPHTHALMIC | Status: DC | PRN

## 2014-10-15 MED ORDER — NA CHONDROIT SULF-NA HYALURON 40-17 MG/ML IO SOLN
INTRAOCULAR | Status: DC | PRN
  Administered 2014-10-15: 1 mL via INTRAOCULAR

## 2014-10-15 MED ORDER — ARMC OPHTHALMIC DILATING GEL
OPHTHALMIC | Status: AC
  Administered 2014-10-15: 1 via OPHTHALMIC
  Filled 2014-10-15: qty 0.25

## 2014-10-15 MED ORDER — MOXIFLOXACIN HCL 0.5 % OP SOLN
OPHTHALMIC | Status: DC | PRN
  Administered 2014-10-15: 1 [drp] via OPHTHALMIC

## 2014-10-15 MED ORDER — MOXIFLOXACIN HCL 0.5 % OP SOLN
OPHTHALMIC | Status: AC
  Filled 2014-10-15: qty 3

## 2014-10-15 MED ORDER — EPINEPHRINE HCL 1 MG/ML IJ SOLN
INTRAOCULAR | Status: DC | PRN
  Administered 2014-10-15: 09:00:00 via OPHTHALMIC

## 2014-10-15 MED ORDER — SODIUM CHLORIDE 0.9 % IV SOLN
INTRAVENOUS | Status: DC
  Administered 2014-10-15: 08:00:00 via INTRAVENOUS

## 2014-10-15 MED ORDER — TETRACAINE HCL 0.5 % OP SOLN
1.0000 [drp] | OPHTHALMIC | Status: AC | PRN
  Administered 2014-10-15: 1 [drp] via OPHTHALMIC

## 2014-10-15 MED ORDER — TETRACAINE HCL 0.5 % OP SOLN
OPHTHALMIC | Status: AC
  Administered 2014-10-15: 1 [drp] via OPHTHALMIC
  Filled 2014-10-15: qty 2

## 2014-10-15 MED ORDER — POVIDONE-IODINE 5 % OP SOLN
1.0000 "application " | OPHTHALMIC | Status: AC | PRN
  Administered 2014-10-15: 1 via OPHTHALMIC

## 2014-10-15 SURGICAL SUPPLY — 23 items
CANNULA ANT/CHMB 27G (MISCELLANEOUS) ×1 IMPLANT
CANNULA ANT/CHMB 27GA (MISCELLANEOUS) ×3 IMPLANT
CUP MEDICINE 2OZ PLAST GRAD ST (MISCELLANEOUS) ×3 IMPLANT
GLOVE BIO SURGEON STRL SZ8 (GLOVE) ×3 IMPLANT
GLOVE BIOGEL M 6.5 STRL (GLOVE) ×3 IMPLANT
GLOVE SURG LX 8.0 MICRO (GLOVE) ×2
GLOVE SURG LX STRL 8.0 MICRO (GLOVE) ×1 IMPLANT
GOWN STRL REUS W/ TWL LRG LVL3 (GOWN DISPOSABLE) ×2 IMPLANT
GOWN STRL REUS W/TWL LRG LVL3 (GOWN DISPOSABLE) ×6
LENS IOL TECNIS 23.0 (Intraocular Lens) ×3 IMPLANT
LENS IOL TECNIS MONO 1P 23.0 (Intraocular Lens) IMPLANT
PACK CATARACT (MISCELLANEOUS) ×3 IMPLANT
PACK CATARACT BRASINGTON LX (MISCELLANEOUS) ×3 IMPLANT
PACK EYE AFTER SURG (MISCELLANEOUS) ×3 IMPLANT
SOL BSS BAG (MISCELLANEOUS) ×3
SOL PREP PVP 2OZ (MISCELLANEOUS) ×3
SOLUTION BSS BAG (MISCELLANEOUS) ×1 IMPLANT
SOLUTION PREP PVP 2OZ (MISCELLANEOUS) ×1 IMPLANT
SYR 3ML LL SCALE MARK (SYRINGE) ×3 IMPLANT
SYR 5ML LL (SYRINGE) ×3 IMPLANT
SYR TB 1ML 27GX1/2 LL (SYRINGE) ×3 IMPLANT
WATER STERILE IRR 1000ML POUR (IV SOLUTION) ×3 IMPLANT
WIPE NON LINTING 3.25X3.25 (MISCELLANEOUS) ×3 IMPLANT

## 2014-10-15 NOTE — Anesthesia Preprocedure Evaluation (Addendum)
Anesthesia Evaluation  Patient identified by MRN, date of birth, ID band Patient awake    Reviewed: Allergy & Precautions, NPO status , Patient's Chart, lab work & pertinent test results, reviewed documented beta blocker date and time   Airway Mallampati: III  TM Distance: >3 FB     Dental  (+) Chipped   Pulmonary sleep apnea , COPDformer smoker,  breath sounds clear to auscultation  Pulmonary exam normal       Cardiovascular hypertension, Pt. on medications + CAD and +CHF Normal cardiovascular exam    Neuro/Psych PSYCHIATRIC DISORDERS Anxiety Depression Schizophrenia    GI/Hepatic GERD-  Medicated and Controlled,  Endo/Other  diabetes, Well Controlled, Type 2  Renal/GU Renal InsufficiencyRenal disease     Musculoskeletal  (+) Arthritis -,   Abdominal (+) + obese,   Peds  Hematology   Anesthesia Other Findings Obesity. Does not use CPAP. Overbite. Poor dentition. Somnolent this am,  Apparently this is normal  Reproductive/Obstetrics                            Anesthesia Physical Anesthesia Plan  ASA: III  Anesthesia Plan: MAC   Post-op Pain Management:    Induction:   Airway Management Planned: Nasal Cannula  Additional Equipment:   Intra-op Plan:   Post-operative Plan:   Informed Consent: I have reviewed the patients History and Physical, chart, labs and discussed the procedure including the risks, benefits and alternatives for the proposed anesthesia with the patient or authorized representative who has indicated his/her understanding and acceptance.   Dental advisory given  Plan Discussed with: CRNA and Surgeon  Anesthesia Plan Comments:         Anesthesia Quick Evaluation

## 2014-10-15 NOTE — Op Note (Signed)
PREOPERATIVE DIAGNOSIS:  Nuclear sclerotic cataract of the right eye.   POSTOPERATIVE DIAGNOSIS: CATARACT   OPERATIVE PROCEDURE:  Procedure(s): CATARACT EXTRACTION PHACO AND INTRAOCULAR LENS PLACEMENT (IOC)   SURGEON:  Birder Robson, MD.   ANESTHESIA:  Anesthesiologist: Alvin Critchley, MD CRNA: Aline Brochure, CRNA; Nelda Marseille, CRNA  1.      Managed anesthesia care. 2.      Topical tetracaine drops followed by 2% Xylocaine jelly applied in the preoperative holding area.   COMPLICATIONS:  None.   TECHNIQUE:   Stop and chop   DESCRIPTION OF PROCEDURE:  The patient was examined and consented in the preoperative holding area where the aforementioned topical anesthesia was applied to the right eye and then brought back to the Operating Room where the right eye was prepped and draped in the usual sterile ophthalmic fashion and a lid speculum was placed. A paracentesis was created with the side port blade and the anterior chamber was filled with viscoelastic. A near clear corneal incision was performed with the steel keratome. A continuous curvilinear capsulorrhexis was performed with a cystotome followed by the capsulorrhexis forceps. Hydrodissection and hydrodelineation were carried out with BSS on a blunt cannula. The lens was removed in a stop and chop  technique and the remaining cortical material was removed with the irrigation-aspiration handpiece. The capsular bag was inflated with viscoelastic and the Technis ZCB00  lens was placed in the capsular bag without complication. The remaining viscoelastic was removed from the eye with the irrigation-aspiration handpiece. The wounds were hydrated. The anterior chamber was flushed with Miostat and the eye was inflated to physiologic pressure. 0.1 mL of cefuroxime concentration 10 mg/mL was placed in the anterior chamber. The wounds were found to be water tight. The eye was dressed with Vigamox. The patient was given protective glasses to wear  throughout the day and a shield with which to sleep tonight. The patient was also given drops with which to begin a drop regimen today and will follow-up with me in one day.  Implant Name Type Inv. Item Serial No. Manufacturer Lot No. LRB No. Used  LENS IMPL INTRAOC ZCB00 23.0 - [id number] Intraocular Lens LENS IMPL INTRAOC ZCB00 23.0 [id number] AMO   Right 1   Procedure(s) with comments: CATARACT EXTRACTION PHACO AND INTRAOCULAR LENS PLACEMENT (IOC) (Right) - [place]: 00:52 AP:40.1 CDE:11.83 LOT [id number] #[id number]  Electronically signed: Konstantine Gervasi LOUIS 10/15/2014 9:15 AM

## 2014-10-15 NOTE — Anesthesia Postprocedure Evaluation (Signed)
  Anesthesia Post-op Note  Patient: [name] [name]  Procedure(s) Performed: Procedure(s) with comments: CATARACT EXTRACTION PHACO AND INTRAOCULAR LENS PLACEMENT (IOC) (Right) - [place]: 00:52 AP:40.1 CDE:11.83 LOT [id number] #[id number]  Anesthesia type:MAC  Patient location: PACU  Post pain: Pain level controlled  Post assessment: Post-op Vital signs reviewed, Patient's Cardiovascular Status Stable, Respiratory Function Stable, Patent Airway and No signs of Nausea or vomiting  Post vital signs: Reviewed and stable  Last Vitals:  Filed Vitals:   10/15/14 0747  BP: 153/78  Pulse: 65  Temp: 36.6 C  Resp: 22    Level of consciousness: awake, alert  and patient cooperative  Complications: No apparent anesthesia complications

## 2014-10-15 NOTE — Transfer of Care (Signed)
Immediate Anesthesia Transfer of Care Note  Patient: [name] [name]  Procedure(s) Performed: Procedure(s) with comments: CATARACT EXTRACTION PHACO AND INTRAOCULAR LENS PLACEMENT (IOC) (Right) - [place]: 00:52 AP:40.1 CDE:11.83 LOT [id number] #[id number]  Patient Location: PACU  Anesthesia Type:MAC  Level of Consciousness: awake and oriented  Airway & Oxygen Therapy: Patient Spontanous Breathing  Post-op Assessment: Report given to RN and Post -op Vital signs reviewed and stable  Post vital signs: Reviewed and stable  Last Vitals:  Filed Vitals:   10/15/14 0747  BP: 153/78  Pulse: 65  Temp: 36.6 C  Resp: 22    Complications: No apparent anesthesia complications

## 2014-10-15 NOTE — H&P (Signed)
  All labs reviewed. Abnormal studies sent to patients PCP when indicated.  Previous H&P reviewed, patient examined, there are NO CHANGES.  [name] [name] LOUIS8/23/20168:48 AM

## 2014-10-16 ENCOUNTER — Encounter: Payer: Self-pay | Admitting: Emergency Medicine

## 2014-10-16 ENCOUNTER — Emergency Department
Admission: EM | Admit: 2014-10-16 | Discharge: 2014-10-16 | Disposition: A | Discharge status: Home / Self Care | Payer: Medicare HMO | Attending: Emergency Medicine | Admitting: Emergency Medicine

## 2014-10-16 DIAGNOSIS — N189 Chronic kidney disease, unspecified: Secondary | ICD-10-CM | POA: Insufficient documentation

## 2014-10-16 DIAGNOSIS — I129 Hypertensive chronic kidney disease with stage 1 through stage 4 chronic kidney disease, or unspecified chronic kidney disease: Secondary | ICD-10-CM | POA: Diagnosis not present

## 2014-10-16 DIAGNOSIS — Z7982 Long term (current) use of aspirin: Secondary | ICD-10-CM | POA: Diagnosis not present

## 2014-10-16 DIAGNOSIS — S20229A Contusion of unspecified back wall of thorax, initial encounter: Secondary | ICD-10-CM | POA: Insufficient documentation

## 2014-10-16 DIAGNOSIS — Z87891 Personal history of nicotine dependence: Secondary | ICD-10-CM | POA: Diagnosis not present

## 2014-10-16 DIAGNOSIS — E119 Type 2 diabetes mellitus without complications: Secondary | ICD-10-CM | POA: Diagnosis not present

## 2014-10-16 DIAGNOSIS — S3992XA Unspecified injury of lower back, initial encounter: Secondary | ICD-10-CM | POA: Diagnosis present

## 2014-10-16 DIAGNOSIS — Z794 Long term (current) use of insulin: Secondary | ICD-10-CM | POA: Insufficient documentation

## 2014-10-16 DIAGNOSIS — Y92410 Unspecified street and highway as the place of occurrence of the external cause: Secondary | ICD-10-CM | POA: Insufficient documentation

## 2014-10-16 DIAGNOSIS — Z041 Encounter for examination and observation following transport accident: Secondary | ICD-10-CM

## 2014-10-16 DIAGNOSIS — Y998 Other external cause status: Secondary | ICD-10-CM | POA: Diagnosis not present

## 2014-10-16 DIAGNOSIS — Y9389 Activity, other specified: Secondary | ICD-10-CM | POA: Insufficient documentation

## 2014-10-16 DIAGNOSIS — Z79899 Other long term (current) drug therapy: Secondary | ICD-10-CM | POA: Diagnosis not present

## 2014-10-16 MED ORDER — NAPROXEN 500 MG PO TABS
500.0000 mg | ORAL_TABLET | Freq: Once | ORAL | Status: AC
  Administered 2014-10-16: 500 mg via ORAL
  Filled 2014-10-16: qty 1
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
## 2014-10-16 NOTE — Discharge Instructions (Signed)
Contusion °A contusion is a deep bruise. Contusions happen when an injury causes bleeding under the skin. Signs of bruising include pain, puffiness (swelling), and discolored skin. The contusion may turn blue, purple, or yellow. °HOME CARE  °· Put ice on the injured area. °¨ Put ice in a plastic bag. °¨ Place a towel between your skin and the bag. °¨ Leave the ice on for 15-20 minutes, 03-04 times a day. °· Only take medicine as told by your doctor. °· Rest the injured area. °· If possible, raise (elevate) the injured area to lessen puffiness. °GET HELP RIGHT AWAY IF:  °· You have more bruising or puffiness. °· You have pain that is getting worse. °· Your puffiness or pain is not helped by medicine. °MAKE SURE YOU:  °· Understand these instructions. °· Will watch your condition. °· Will get help right away if you are not doing well or get worse. °Document Released: 07/28/2007 Document Revised: 05/03/2011 Document Reviewed: 12/14/2010 °ExitCare® Patient Information ©2015 [employer]. This information is not intended to replace advice given to you by your health care provider. Make sure you discuss any questions you have with your health care provider. ° °

## 2014-10-28 ENCOUNTER — Emergency Department
Admission: EM | Admit: 2014-10-28 | Discharge: 2014-10-29 | Disposition: A | Discharge status: Other Acute Inpatient Hospital | Payer: Medicare HMO | Attending: Emergency Medicine | Admitting: Emergency Medicine

## 2014-10-28 ENCOUNTER — Encounter: Payer: Self-pay | Admitting: Medical Oncology

## 2014-10-28 DIAGNOSIS — Z046 Encounter for general psychiatric examination, requested by authority: Secondary | ICD-10-CM

## 2014-10-28 DIAGNOSIS — F200 Paranoid schizophrenia: Secondary | ICD-10-CM | POA: Insufficient documentation

## 2014-10-28 DIAGNOSIS — N189 Chronic kidney disease, unspecified: Secondary | ICD-10-CM | POA: Diagnosis not present

## 2014-10-28 DIAGNOSIS — Z79899 Other long term (current) drug therapy: Secondary | ICD-10-CM | POA: Insufficient documentation

## 2014-10-28 DIAGNOSIS — E119 Type 2 diabetes mellitus without complications: Secondary | ICD-10-CM | POA: Diagnosis not present

## 2014-10-28 DIAGNOSIS — Z7982 Long term (current) use of aspirin: Secondary | ICD-10-CM | POA: Insufficient documentation

## 2014-10-28 DIAGNOSIS — Z9119 Patient's noncompliance with other medical treatment and regimen: Secondary | ICD-10-CM | POA: Insufficient documentation

## 2014-10-28 DIAGNOSIS — Z7951 Long term (current) use of inhaled steroids: Secondary | ICD-10-CM | POA: Insufficient documentation

## 2014-10-28 DIAGNOSIS — Z794 Long term (current) use of insulin: Secondary | ICD-10-CM | POA: Diagnosis not present

## 2014-10-28 DIAGNOSIS — F309 Manic episode, unspecified: Secondary | ICD-10-CM | POA: Diagnosis not present

## 2014-10-28 DIAGNOSIS — I5032 Chronic diastolic (congestive) heart failure: Secondary | ICD-10-CM | POA: Diagnosis present

## 2014-10-28 DIAGNOSIS — I129 Hypertensive chronic kidney disease with stage 1 through stage 4 chronic kidney disease, or unspecified chronic kidney disease: Secondary | ICD-10-CM | POA: Insufficient documentation

## 2014-10-28 DIAGNOSIS — Z87891 Personal history of nicotine dependence: Secondary | ICD-10-CM | POA: Insufficient documentation

## 2014-10-28 DIAGNOSIS — F250 Schizoaffective disorder, bipolar type: Secondary | ICD-10-CM | POA: Diagnosis not present

## 2014-10-28 LAB — COMPREHENSIVE METABOLIC PANEL
ALT: 24 U/L (ref 14–54)
AST: 35 U/L (ref 15–41)
Albumin: 4.1 g/dL (ref 3.5–5.0)
Alkaline Phosphatase: 80 U/L (ref 38–126)
Anion gap: 10 (ref 5–15)
BUN: 11 mg/dL (ref 6–20)
CHLORIDE: 98 mmol/L — AB (ref 101–111)
CO2: 29 mmol/L (ref 22–32)
Calcium: 9.2 mg/dL (ref 8.9–10.3)
Creatinine, Ser: 0.98 mg/dL (ref 0.44–1.00)
GFR, EST NON AFRICAN AMERICAN: 58 mL/min — AB (ref >60)
Glucose, Bld: 119 mg/dL — ABNORMAL HIGH (ref 65–99)
POTASSIUM: 3.7 mmol/L (ref 3.5–5.1)
SODIUM: 137 mmol/L (ref 135–145)
Total Bilirubin: 0.6 mg/dL (ref 0.3–1.2)
Total Protein: 7.8 g/dL (ref 6.5–8.1)

## 2014-10-28 LAB — CBC
HCT: 43.1 % (ref 35.0–47.0)
HEMOGLOBIN: 13.8 g/dL (ref 12.0–16.0)
MCH: 28 pg (ref 26.0–34.0)
MCHC: 32 g/dL (ref 32.0–36.0)
MCV: 87.4 fL (ref 80.0–100.0)
PLATELETS: 244 10*3/uL (ref 150–440)
RBC: 4.93 MIL/uL (ref 3.80–5.20)
RDW: 13.5 % (ref 11.5–14.5)
WBC: 12.7 10*3/uL — AB (ref 3.6–11.0)

## 2014-10-28 LAB — SALICYLATE LEVEL

## 2014-10-28 LAB — GLUCOSE, CAPILLARY: GLUCOSE-CAPILLARY: 230 mg/dL — AB (ref 65–99)

## 2014-10-28 LAB — ETHANOL

## 2014-10-28 LAB — ACETAMINOPHEN LEVEL

## 2014-10-28 MED ORDER — LISINOPRIL 5 MG PO TABS
5.0000 mg | ORAL_TABLET | Freq: Once | ORAL | Status: AC
  Administered 2014-10-28: 5 mg via ORAL

## 2014-10-28 MED ORDER — LORAZEPAM 0.5 MG PO TABS
0.5000 mg | ORAL_TABLET | Freq: Three times a day (TID) | ORAL | Status: DC
  Administered 2014-10-28 – 2014-10-29 (×3): 0.5 mg via ORAL
  Filled 2014-10-28 (×2): qty 1

## 2014-10-28 MED ORDER — FUROSEMIDE 20 MG PO TABS
40.0000 mg | ORAL_TABLET | Freq: Every day | ORAL | Status: DC | PRN
  Filled 2014-10-28: qty 2

## 2014-10-28 MED ORDER — FAMOTIDINE 20 MG PO TABS
ORAL_TABLET | ORAL | Status: AC
  Filled 2014-10-28: qty 1

## 2014-10-28 MED ORDER — RISPERIDONE 1 MG PO TABS
ORAL_TABLET | ORAL | Status: AC
  Filled 2014-10-28: qty 1

## 2014-10-28 MED ORDER — ATORVASTATIN CALCIUM 20 MG PO TABS
ORAL_TABLET | ORAL | Status: AC
  Filled 2014-10-28: qty 1

## 2014-10-28 MED ORDER — RISPERIDONE 1 MG PO TABS
0.5000 mg | ORAL_TABLET | Freq: Two times a day (BID) | ORAL | Status: DC
  Administered 2014-10-28 – 2014-10-29 (×2): 0.5 mg via ORAL
  Filled 2014-10-28: qty 1

## 2014-10-28 MED ORDER — GLIPIZIDE 10 MG PO TABS
10.0000 mg | ORAL_TABLET | Freq: Two times a day (BID) | ORAL | Status: DC
  Administered 2014-10-29 (×2): 10 mg via ORAL
  Filled 2014-10-28 (×3): qty 1

## 2014-10-28 MED ORDER — ASPIRIN EC 81 MG PO TBEC
81.0000 mg | DELAYED_RELEASE_TABLET | Freq: Once | ORAL | Status: AC
  Administered 2014-10-28: 81 mg via ORAL
  Filled 2014-10-28: qty 1

## 2014-10-28 MED ORDER — OLANZAPINE 10 MG PO TABS
10.0000 mg | ORAL_TABLET | ORAL | Status: AC
  Administered 2014-10-28: 10 mg via ORAL
  Filled 2014-10-28: qty 1

## 2014-10-28 MED ORDER — CITALOPRAM HYDROBROMIDE 20 MG PO TABS
ORAL_TABLET | ORAL | Status: AC
  Filled 2014-10-28: qty 1

## 2014-10-28 MED ORDER — FAMOTIDINE 20 MG PO TABS
20.0000 mg | ORAL_TABLET | Freq: Two times a day (BID) | ORAL | Status: DC
  Administered 2014-10-28 – 2014-10-29 (×2): 20 mg via ORAL
  Filled 2014-10-28 (×2): qty 1

## 2014-10-28 MED ORDER — INSULIN GLARGINE 100 UNIT/ML ~~LOC~~ SOLN
15.0000 [IU] | Freq: Every day | SUBCUTANEOUS | Status: DC
  Administered 2014-10-28: 15 [IU] via SUBCUTANEOUS
  Filled 2014-10-28 (×2): qty 0.15

## 2014-10-28 MED ORDER — ATORVASTATIN CALCIUM 20 MG PO TABS
20.0000 mg | ORAL_TABLET | Freq: Every day | ORAL | Status: DC
  Administered 2014-10-28: 20 mg via ORAL

## 2014-10-28 MED ORDER — SPIRONOLACTONE 25 MG PO TABS
25.0000 mg | ORAL_TABLET | Freq: Every day | ORAL | Status: DC
  Administered 2014-10-28 – 2014-10-29 (×2): 25 mg via ORAL
  Filled 2014-10-28 (×2): qty 1

## 2014-10-28 MED ORDER — ACETAMINOPHEN 325 MG PO TABS
650.0000 mg | ORAL_TABLET | ORAL | Status: DC | PRN
  Administered 2014-10-28 – 2014-10-29 (×2): 650 mg via ORAL
  Filled 2014-10-28: qty 2

## 2014-10-28 MED ORDER — LISINOPRIL 20 MG PO TABS
ORAL_TABLET | ORAL | Status: AC
  Filled 2014-10-28: qty 1

## 2014-10-28 MED ORDER — FLUPHENAZINE HCL 5 MG PO TABS
10.0000 mg | ORAL_TABLET | Freq: Every day | ORAL | Status: DC
  Administered 2014-10-28: 10 mg via ORAL

## 2014-10-28 MED ORDER — METOPROLOL TARTRATE 50 MG PO TABS
50.0000 mg | ORAL_TABLET | Freq: Once | ORAL | Status: AC
  Administered 2014-10-28: 50 mg via ORAL
  Filled 2014-10-28: qty 1

## 2014-10-28 MED ORDER — ACETAMINOPHEN 325 MG PO TABS
ORAL_TABLET | ORAL | Status: AC
  Filled 2014-10-28: qty 2

## 2014-10-28 MED ORDER — GLIMEPIRIDE 2 MG PO TABS
2.0000 mg | ORAL_TABLET | Freq: Two times a day (BID) | ORAL | Status: DC
  Administered 2014-10-29: 2 mg via ORAL
  Filled 2014-10-28 (×2): qty 1

## 2014-10-28 MED ORDER — FLUPHENAZINE HCL 5 MG PO TABS
ORAL_TABLET | ORAL | Status: AC
  Filled 2014-10-28: qty 2

## 2014-10-28 MED ORDER — LINAGLIPTIN 5 MG PO TABS
5.0000 mg | ORAL_TABLET | Freq: Every day | ORAL | Status: DC
  Administered 2014-10-28 – 2014-10-29 (×2): 5 mg via ORAL
  Filled 2014-10-28 (×2): qty 1

## 2014-10-28 MED ORDER — LORAZEPAM 0.5 MG PO TABS
ORAL_TABLET | ORAL | Status: AC
  Filled 2014-10-28: qty 1

## 2014-10-28 MED ORDER — CITALOPRAM HYDROBROMIDE 20 MG PO TABS
10.0000 mg | ORAL_TABLET | Freq: Every day | ORAL | Status: DC
  Administered 2014-10-28: 10 mg via ORAL
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
## 2014-10-28 NOTE — ED Notes (Signed)
Pt. Noted in room. No complaints or concerns voiced. No distress or abnormal behavior noted. Will continue to monitor with security cameras. Q 15 minute rounds continue. 

## 2014-10-28 NOTE — ED Notes (Signed)

## 2014-10-28 NOTE — BH Assessment (Signed)
Assessment Note  [name] [name] is an 69 y.o. female. Ms. Mongillo reports to the ED by way of the [facility] department.  She was reported as not taking her medication, running in and out of the group home, and repeatedly calling 911.  She states that she has recently changed group home and that "The devil tried to walk on me, but it's okay, because I took my medicine".  She reports "I feel wonderful" with a lot of enthusiasm.  She denied being depressed or anxious. She denied suicidal or homicidal ideation or intent.  She appeared scattered and overly talkative.  She was loud and attention seeking.  She states she has an ACT team with psychotherapeutic services.  She appeared to be responding to internal stimuli.   Axis I: See current hospital problem list Axis II: Deferred Axis III:  Past Medical History  Diagnosis Date  . DM2 (diabetes mellitus, type 2)   . Hypertension   . Hypercholesteremia   . Schizophrenia   . Osteoarthritis   . Bursitis   . OSA (obstructive sleep apnea)     does not use machine  . Left ventricular outflow tract obstruction     a. echo 03/2014: EF 60-65%, hypernamic LV systolic fxn, mod LVH w/ LVOT gradient estimated at 68 mm Hg w/ valsalva, very small LV internal cavity size, mildly increased LV posterior wall thickness, mild Ao valve scl w/o stenosis, diastolic dysfunction, normal RVSP  . LVH (left ventricular hypertrophy)     a. echo suggests long standing uncontrolled htn. she will not do well when dehydrated, LV cavity obliteration  . Obesity   . CHF (congestive heart failure)   . Anxiety   . COPD (chronic obstructive pulmonary disease)   . Bronchitis   . CAD (coronary artery disease)   . Tremors of nervous system   . GERD (gastroesophageal reflux disease)   . Environmental allergies   . Arthritis   . Depression   . Chronic kidney disease     shadow on x-ray  . Muscle weakness   . Chronic cough   . Lower extremity edema   . Wheezing   . On  supplemental oxygen therapy     AS NEEDED   Axis IV: housing problems and problems with primary support group Axis V: 31-40 impairment in reality testing  Past Medical History:  Past Medical History  Diagnosis Date  . DM2 (diabetes mellitus, type 2)   . Hypertension   . Hypercholesteremia   . Schizophrenia   . Osteoarthritis   . Bursitis   . OSA (obstructive sleep apnea)     does not use machine  . Left ventricular outflow tract obstruction     a. echo 03/2014: EF 60-65%, hypernamic LV systolic fxn, mod LVH w/ LVOT gradient estimated at 68 mm Hg w/ valsalva, very small LV internal cavity size, mildly increased LV posterior wall thickness, mild Ao valve scl w/o stenosis, diastolic dysfunction, normal RVSP  . LVH (left ventricular hypertrophy)     a. echo suggests long standing uncontrolled htn. she will not do well when dehydrated, LV cavity obliteration  . Obesity   . CHF (congestive heart failure)   . Anxiety   . COPD (chronic obstructive pulmonary disease)   . Bronchitis   . CAD (coronary artery disease)   . Tremors of nervous system   . GERD (gastroesophageal reflux disease)   . Environmental allergies   . Arthritis   . Depression   . Chronic kidney  disease     shadow on x-ray  . Muscle weakness   . Chronic cough   . Lower extremity edema   . Wheezing   . On supplemental oxygen therapy     AS NEEDED    Past Surgical History  Procedure Laterality Date  . Tonsillectomy    . Knee reconstruction, medial patellar femoral ligament    . Cholecystectomy    . Colonoscopy  04/2011    [facility] per patient incomplete  . Tonsillectomy    . Cataract extraction w/phaco Left 09/10/2014    Procedure: CATARACT EXTRACTION PHACO AND INTRAOCULAR LENS PLACEMENT (IOC);  Surgeon: Birder Robson, MD;  Location: [facility] ORS;  Service: Ophthalmology;  Laterality: Left;  [place]: 00:44 AP%: 22.8 CDE: 10.24 Fluid lot #[id number]  . Joint replacement      TKR  . Eye surgery    . Cataract extraction  w/phaco Right 10/15/2014    Procedure: CATARACT EXTRACTION PHACO AND INTRAOCULAR LENS PLACEMENT (IOC);  Surgeon: Birder Robson, MD;  Location: [facility] ORS;  Service: Ophthalmology;  Laterality: Right;  [place]: 00:52 AP:40.1 CDE:11.83 LOT HQIO #[id number]    Family History:  Family History  Problem Relation Age of Onset  . Colon cancer Neg Hx   . Liver disease Neg Hx   . Heart attack Mother     Social History:  reports that she has quit smoking. She has never used smokeless tobacco. She reports that she drinks alcohol. She reports that she does not use illicit drugs.  Additional Social History:  Alcohol / Drug Use History of alcohol / drug use?: No history of alcohol / drug abuse  CIWA: CIWA-Ar BP: 140/82 mmHg Pulse Rate: 75 COWS:    Allergies:  Allergies  Allergen Reactions  . Haldol [Haloperidol Decanoate] Swelling and Other (See Comments)    Tongue swells and blurred vision  . Metformin Diarrhea  . Raspberry Swelling    Other reaction(s): SWELLINGin lips    Home Medications:  (Not in a hospital admission)  OB/GYN Status:  No LMP recorded. Patient is postmenopausal.  General Assessment Data Location of Assessment: [facility] ED TTS Assessment: In system Is this a Tele or Face-to-Face Assessment?: Face-to-Face Is this an Initial Assessment or a Re-assessment for this encounter?: Initial Assessment Marital status: Single Is patient pregnant?: No Pregnancy Status: No Living Arrangements: Group Home Can pt return to current living arrangement?: Yes Admission Status: Involuntary Is patient capable of signing voluntary admission?: No Referral Source: Other Insurance type: [employer] Medicare  Medical Screening Exam ([place]) Medical Exam completed: Yes  Crisis Care Plan Living Arrangements: Group Home Name of Psychiatrist: Psychotherapeutic Services (ACT team) Name of Therapist: Psyschotherapeutic Services  Education Status Is patient currently in school?:  No Current Grade: n/a Highest grade of school patient has completed: 12 Name of school: [employer] person: [name] [phone] (Son) - Guardian  Risk to self with the past 6 months Suicidal Ideation: No Has patient been a risk to self within the past 6 months prior to admission? : No Suicidal Intent: No Has patient had any suicidal intent within the past 6 months prior to admission? : No Is patient at risk for suicide?: No Suicidal Plan?: No Has patient had any suicidal plan within the past 6 months prior to admission? : No Access to Means: No What has been your use of drugs/alcohol within the last 12 months?: None reported Previous Attempts/Gestures: No How many times?: 0 Other Self Harm Risks: None reported Triggers for  Past Attempts: None known Intentional Self Injurious Behavior: None Family Suicide History: Unknown Recent stressful life event(s): Other (Comment) (Recent relocation) Persecutory voices/beliefs?: Yes (Hyper religeous) Depression: No Depression Symptoms:  (Denied ) Substance abuse history and/or treatment for substance abuse?: No Suicide prevention information given to non-admitted patients: Not applicable  Risk to Others within the past 6 months Homicidal Ideation: No Does patient have any lifetime risk of violence toward others beyond the six months prior to admission? : No Thoughts of Harm to Others: No Current Homicidal Intent: No Current Homicidal Plan: No Access to Homicidal Means: No Identified Victim: None reported History of harm to others?: No Assessment of Violence: None Noted Violent Behavior Description: None reported Does patient have access to weapons?: No Criminal Charges Pending?: No Does patient have a court date: No Is patient on probation?: No  Psychosis Hallucinations: None noted (denies, but appears to be responding to internal stimuli) Delusions: None noted  Mental Status  Report Appearance/Hygiene: In scrubs, Unremarkable Eye Contact: Fair Motor Activity: Restlessness, Hyperactivity Speech: Loud Level of Consciousness: Alert Mood: Pleasant Affect: Other (Comment) (Excessively Happy) Anxiety Level: None Thought Processes: Flight of Ideas Judgement: Impaired Orientation: Person, Place, Situation Obsessive Compulsive Thoughts/Behaviors: None  Cognitive Functioning Concentration: Unable to Assess Memory: Unable to Assess Insight: Poor Impulse Control: Poor Appetite: Good Sleep: Unable to Assess  ADLScreening [facility] Assessment Services) Patient's cognitive ability adequate to safely complete daily activities?: Yes Patient able to express need for assistance with ADLs?: Yes Independently performs ADLs?: Yes (appropriate for developmental age)  Prior Inpatient Therapy Prior Inpatient Therapy: Yes Prior Therapy Dates: Unknown Prior Therapy Facilty/Provider(s): Unwilling to provide facilities Reason for Treatment: Schizophrenia  Prior Outpatient Therapy Prior Outpatient Therapy: Yes Prior Therapy Dates: Current Prior Therapy Facilty/Provider(s): [facility]otherapeutic [facility] Reason for Treatment: Schizophrenia Does patient have an ACCT team?: Yes Does patient have Intensive In-House Services?  : No Does patient have [facility] services? : No Does patient have P4CC services?: No  ADL Screening (condition at time of admission) Patient's cognitive ability adequate to safely complete daily activities?: Yes Patient able to express need for assistance with ADLs?: Yes Independently performs ADLs?: Yes (appropriate for developmental age)       Abuse/Neglect Assessment (Assessment to be complete while patient is alone) Physical Abuse: Denies Verbal Abuse: Denies Sexual Abuse: Denies Exploitation of patient/patient's resources: Denies Self-Neglect: Denies Values / Beliefs Cultural Requests During Hospitalization: None Spiritual Requests During  Hospitalization: None        Additional Information 1:1 In Past 12 Months?: No CIRT Risk: No Elopement Risk: No Does patient have medical clearance?: Yes     Disposition:  Disposition Initial Assessment Completed for this Encounter: Yes Disposition of Patient: Other dispositions Other disposition(s):  (TO be seen by the psychiatrist)  On Site Evaluation by:   Reviewed with Physician:    Elmer Bales 10/28/2014 11:36 PM

## 2014-10-28 NOTE — ED Notes (Signed)
BEHAVIORAL HEALTH ROUNDING Patient sleeping: No. Patient alert and oriented: yes Behavior appropriate: Yes.  ; If no, describe:  Nutrition and fluids offered: Yes  Toileting and hygiene offered: Yes  Sitter present: no Law enforcement present: Yes  

## 2014-10-28 NOTE — ED Notes (Signed)
BEHAVIORAL HEALTH ROUNDING Patient sleeping: No. Patient alert and oriented: yes Behavior appropriate: Yes.  ; If no, describe:  Nutrition and fluids offered: Yes  Toileting and hygiene offered: Yes  Sitter present: not applicable Law enforcement present: Yes  

## 2014-10-28 NOTE — ED Notes (Signed)
Pt. To [facility] from ED ambulatory without difficulty, to room 5 . Report from [facility]. Pt. Is alert, warm and dry in no distress. Pt. Denies SI, HI, and AVH. Pt. Calm and cooperative. Pt. Made aware of security cameras and Q15 minute rounds. Pt. Encouraged to let Nursing staff know of any concerns or needs.

## 2014-10-28 NOTE — ED Provider Notes (Signed)
[facility] Emergency Department Provider Note  ____________________________________________  Time seen: Approximately 7:24 PM  I have reviewed the triage vital signs and the nursing notes.   HISTORY  Chief Complaint Psychiatric Evaluation    HPI KALIN AMRHEIN is a 69 y.o. female with a history that includes paranoid schizophrenia and diabetes who presents under involuntary commitment by her group home.  Reportedly she has been increasingly erratic with her behavior, noncompliant with her medications, running away from the group home, and calling EMS repeatedly.  She is very loud and gregarious in the emergency department but is not threatening anyone at this time.  However, her behavior is causing a danger to herself according to the group home.  She currently denies any issues and says she feels "GREAT!"  She is reading the Bible and singing loudly.   Past Medical History  Diagnosis Date  . DM2 (diabetes mellitus, type 2)   . Hypertension   . Hypercholesteremia   . Schizophrenia   . Osteoarthritis   . Bursitis   . OSA (obstructive sleep apnea)     does not use machine  . Left ventricular outflow tract obstruction     a. echo 03/2014: EF 60-65%, hypernamic LV systolic fxn, mod LVH w/ LVOT gradient estimated at 68 mm Hg w/ valsalva, very small LV internal cavity size, mildly increased LV posterior wall thickness, mild Ao valve scl w/o stenosis, diastolic dysfunction, normal RVSP  . LVH (left ventricular hypertrophy)     a. echo suggests long standing uncontrolled htn. she will not do well when dehydrated, LV cavity obliteration  . Obesity   . CHF (congestive heart failure)   . Anxiety   . COPD (chronic obstructive pulmonary disease)   . Bronchitis   . CAD (coronary artery disease)   . Tremors of nervous system   . GERD (gastroesophageal reflux disease)   . Environmental allergies   . Arthritis   . Depression   . Chronic kidney disease     shadow  on x-ray  . Muscle weakness   . Chronic cough   . Lower extremity edema   . Wheezing   . On supplemental oxygen therapy     AS NEEDED    Patient Active Problem List   Diagnosis Date Noted  . Acute respiratory failure 06/29/2014  . Chronic diastolic CHF (congestive heart failure) 05/15/2014  . Obesity   . LVH (left ventricular hypertrophy)   . Left ventricular outflow tract obstruction   . Can't get food down 03/09/2013  . Acid reflux 03/09/2013  . History of colon polyps 03/09/2013  . Obstructive apnea 03/09/2013  . Absence of bladder continence 03/09/2013  . Renal mass 06/26/2011  . Lytic bone lesion of hip 06/25/2011  . Hypertension 06/24/2011  . Rectal bleeding 06/24/2011  . Schizophrenia 06/24/2011  . Diabetes mellitus 06/24/2011  . Hypokalemia 06/24/2011    Past Surgical History  Procedure Laterality Date  . Tonsillectomy    . Knee reconstruction, medial patellar femoral ligament    . Cholecystectomy    . Colonoscopy  04/2011    [facility] per patient incomplete  . Tonsillectomy    . Cataract extraction w/phaco Left 09/10/2014    Procedure: CATARACT EXTRACTION PHACO AND INTRAOCULAR LENS PLACEMENT (IOC);  Surgeon: Birder Robson, MD;  Location: [facility] ORS;  Service: Ophthalmology;  Laterality: Left;  [place]: 00:44 AP%: 22.8 CDE: 10.24 Fluid lot #[id number]  . Joint replacement      TKR  . Eye surgery    .  Cataract extraction w/phaco Right 10/15/2014    Procedure: CATARACT EXTRACTION PHACO AND INTRAOCULAR LENS PLACEMENT (IOC);  Surgeon: Birder Robson, MD;  Location: [facility] ORS;  Service: Ophthalmology;  Laterality: Right;  [place]: 00:52 AP:40.1 CDE:11.83 LOT JYNW #[id number]    Current Outpatient Rx  Name  Route  Sig  Dispense  Refill  . acetaminophen (TYLENOL) 325 MG tablet   Oral   Take 650 mg by mouth every 4 (four) hours as needed for mild pain.          Marland Kitchen aspirin 81 MG tablet   Oral   Take 81 mg by mouth daily.         Marland Kitchen atorvastatin (LIPITOR) 20 MG tablet    Oral   Take 20 mg by mouth at bedtime.          . bisacodyl (DULCOLAX) 10 MG suppository   Rectal   Place 10 mg rectally daily as needed for mild constipation or moderate constipation.         . citalopram (CELEXA) 10 MG tablet   Oral   Take 1 tablet by mouth at bedtime.          . famotidine (PEPCID) 20 MG tablet   Oral   Take 20 mg by mouth 2 (two) times daily.         . fluPHENAZine (PROLIXIN) 10 MG tablet   Oral   Take 10 mg by mouth at bedtime.         . fluPHENAZine decanoate (PROLIXIN) 25 MG/ML injection   Intramuscular   Inject 50 mg into the muscle every 21 ( twenty-one) days.          . Fluticasone-Salmeterol (ADVAIR) 250-50 MCG/DOSE AEPB   Inhalation   Inhale 1 puff into the lungs 2 (two) times daily.         . furosemide (LASIX) 20 MG tablet   Oral   Take 40 mg by mouth daily as needed.          Marland Kitchen glimepiride (AMARYL) 2 MG tablet   Oral   Take 1 tablet by mouth 2 (two) times daily.         Marland Kitchen glipiZIDE (GLUCOTROL) 10 MG tablet   Oral   Take 10 mg by mouth 2 (two) times daily.          . hydrocortisone 2.5 % cream   Topical   Apply 1 application topically 2 (two) times daily. Apply to left nipple and right thigh         . insulin glargine (LANTUS) 100 UNIT/ML injection   Subcutaneous   Inject 0.3 mLs (30 Units total) into the skin at bedtime. Patient taking differently: Inject 15 Units into the skin at bedtime.    10 mL   2   . linagliptin (TRADJENTA) 5 MG TABS tablet   Oral   Take 5 mg by mouth daily.         Marland Kitchen lisinopril (PRINIVIL,ZESTRIL) 5 MG tablet   Oral   Take 5 mg by mouth every morning.          Marland Kitchen LORazepam (ATIVAN) 0.5 MG tablet   Oral   Take 0.5 mg by mouth 3 (three) times daily.          . magnesium hydroxide (MILK OF MAGNESIA) 400 MG/5ML suspension   Oral   Take 30 mLs by mouth daily as needed for mild constipation.         . metoprolol succinate (TOPROL-XL) 50 MG  24 hr tablet   Oral   Take 1  tablet by mouth daily.         . polyethylene glycol powder (GLYCOLAX/MIRALAX) powder   Oral   Take 17 g by mouth daily as needed.         . risperiDONE (RISPERDAL) 0.5 MG tablet   Oral   Take 1 tablet by mouth every morning. Take 1 tablet in the morning and take 2 tablets in the evening.         . senna-docusate (SENOKOT-S) 8.6-50 MG per tablet   Oral   Take 2 tablets by mouth daily as needed for mild constipation or moderate constipation. May use 1-2 times daily as needed         . spironolactone (ALDACTONE) 25 MG tablet   Oral   Take 1 tablet by mouth daily.         Marland Kitchen tiotropium (SPIRIVA) 18 MCG inhalation capsule   Inhalation   Place 1 capsule (18 mcg total) into inhaler and inhale daily.   30 capsule   12     Allergies Haldol; Metformin; and Raspberry  Family History  Problem Relation Age of Onset  . Colon cancer Neg Hx   . Liver disease Neg Hx   . Heart attack Mother     Social History Social History  Substance Use Topics  . Smoking status: Former Research scientist (life sciences)  . Smokeless tobacco: Never Used  . Alcohol Use: Yes     Comment: occ.    Review of Systems Constitutional: No fever/chills Eyes: No visual changes. ENT: No sore throat. Cardiovascular: Denies chest pain. Respiratory: Denies shortness of breath. Gastrointestinal: No abdominal pain.  No nausea, no vomiting.  No diarrhea.  No constipation. Genitourinary: Negative for dysuria. Musculoskeletal: Negative for back pain. Skin: Negative for rash. Neurological: Negative for headaches, focal weakness or numbness.  10-point ROS otherwise negative.  ____________________________________________   PHYSICAL EXAM:  VITAL SIGNS: ED Triage Vitals  Enc Vitals Group     BP 10/28/14 1705 184/85 mmHg     Pulse Rate 10/28/14 1705 88     Resp 10/28/14 1705 18     Temp 10/28/14 1705 98.4 F (36.9 C)     Temp Source 10/28/14 1705 Oral     SpO2 10/28/14 1705 95 %     Weight 10/28/14 1705 276 lb (125.193  kg)     Height 10/28/14 1705 5\' 4"  (1.626 m)     Head Cir --      Peak Flow --      Pain Score 10/28/14 1706 0     Pain Loc --      Pain Edu? --      Excl. in [place]? --     Constitutional: Alert, oriented to person and place.  Eyes: Bright red injected conjunctiva on the right which the patient states is because she had surgery recently. PERRL, EOMI. Head: Atraumatic. Nose: No congestion/rhinnorhea. Mouth/Throat: Mucous membranes are moist.  Oropharynx non-erythematous. Neck: No stridor.   Cardiovascular: Normal rate, regular rhythm. Grossly normal heart sounds.  Good peripheral circulation. Respiratory: Normal respiratory effort.  No retractions. Lungs CTAB. Gastrointestinal: Soft and nontender. No distention. No abdominal bruits. No CVA tenderness. Musculoskeletal: No lower extremity tenderness nor edema.  No joint effusions. Neurologic:  Normal speech and language. No gross focal neurologic deficits are appreciated.  Skin:  Skin is warm, dry and intact. No rash noted. Psychiatric: Ebullient affect, loudly yelling out to staff, occasionally will become angry but  quickly corrects (very labile).  Denies SI/HI  ____________________________________________   LABS (all labs ordered are listed, but only abnormal results are displayed)  Labs Reviewed  COMPREHENSIVE METABOLIC PANEL - Abnormal; Notable for the following:    Chloride 98 (*)    Glucose, Bld 119 (*)    GFR calc non Af Amer 58 (*)    All other components within normal limits  ACETAMINOPHEN LEVEL - Abnormal; Notable for the following:    Acetaminophen (Tylenol), Serum <10 (*)    All other components within normal limits  CBC - Abnormal; Notable for the following:    WBC 12.7 (*)    All other components within normal limits  ETHANOL  SALICYLATE LEVEL  URINE DRUG SCREEN, QUALITATIVE ([facility] ONLY)   ____________________________________________  EKG  Not  indicated ____________________________________________  RADIOLOGY  Not indicated  ____________________________________________   PROCEDURES  Procedure(s) performed: None  Critical Care performed: No ____________________________________________   INITIAL IMPRESSION / ASSESSMENT AND PLAN / ED COURSE  Pertinent labs & imaging results that were available during my care of the patient were reviewed by me and considered in my medical decision making (see chart for details).  I will uphold the involuntary commitment at this time.  We are attempting to reconcile her medications but as of right now we do not know what group home she is from, her pharmacy is closed, and we can find no documentation since her last visit last month.  Given the negative side effects if she misses her chronic medications for diabetes, blood pressure, etc., we will use her list in the computer to start her indications backup while waiting in the emergency department.  Additionally I will given her a dose of Zyprexa 10 mg by mouth for what appears to be psychosis/mania.  ____________________________________________  FINAL CLINICAL IMPRESSION(S) / ED DIAGNOSES  Final diagnoses:  Mania  Involuntary commitment      NEW MEDICATIONS STARTED DURING THIS VISIT:  New Prescriptions   No medications on file     Hinda Kehr, MD 10/28/14 2030

## 2014-10-29 ENCOUNTER — Inpatient Hospital Stay
Admission: EM | Admit: 2014-10-29 | Discharge: 2014-11-05 | DRG: 885 | Disposition: A | Discharge status: Home / Self Care | Payer: Medicare HMO | Source: Intra-hospital | Attending: Psychiatry | Admitting: Psychiatry

## 2014-10-29 DIAGNOSIS — Z8249 Family history of ischemic heart disease and other diseases of the circulatory system: Secondary | ICD-10-CM

## 2014-10-29 DIAGNOSIS — I2510 Atherosclerotic heart disease of native coronary artery without angina pectoris: Secondary | ICD-10-CM | POA: Diagnosis present

## 2014-10-29 DIAGNOSIS — E669 Obesity, unspecified: Secondary | ICD-10-CM | POA: Diagnosis present

## 2014-10-29 DIAGNOSIS — Z6841 Body Mass Index (BMI) 40.0 and over, adult: Secondary | ICD-10-CM | POA: Diagnosis not present

## 2014-10-29 DIAGNOSIS — Z833 Family history of diabetes mellitus: Secondary | ICD-10-CM | POA: Diagnosis not present

## 2014-10-29 DIAGNOSIS — M179 Osteoarthritis of knee, unspecified: Secondary | ICD-10-CM | POA: Diagnosis present

## 2014-10-29 DIAGNOSIS — Z87891 Personal history of nicotine dependence: Secondary | ICD-10-CM

## 2014-10-29 DIAGNOSIS — J449 Chronic obstructive pulmonary disease, unspecified: Secondary | ICD-10-CM | POA: Diagnosis present

## 2014-10-29 DIAGNOSIS — I5032 Chronic diastolic (congestive) heart failure: Secondary | ICD-10-CM | POA: Diagnosis present

## 2014-10-29 DIAGNOSIS — R32 Unspecified urinary incontinence: Secondary | ICD-10-CM | POA: Diagnosis present

## 2014-10-29 DIAGNOSIS — Z794 Long term (current) use of insulin: Secondary | ICD-10-CM | POA: Diagnosis not present

## 2014-10-29 DIAGNOSIS — IMO0002 Reserved for concepts with insufficient information to code with codable children: Secondary | ICD-10-CM | POA: Diagnosis present

## 2014-10-29 DIAGNOSIS — E785 Hyperlipidemia, unspecified: Secondary | ICD-10-CM | POA: Diagnosis present

## 2014-10-29 DIAGNOSIS — E119 Type 2 diabetes mellitus without complications: Secondary | ICD-10-CM

## 2014-10-29 DIAGNOSIS — I129 Hypertensive chronic kidney disease with stage 1 through stage 4 chronic kidney disease, or unspecified chronic kidney disease: Secondary | ICD-10-CM | POA: Diagnosis present

## 2014-10-29 DIAGNOSIS — Z79899 Other long term (current) drug therapy: Secondary | ICD-10-CM

## 2014-10-29 DIAGNOSIS — Z9114 Patient's other noncompliance with medication regimen: Secondary | ICD-10-CM | POA: Diagnosis present

## 2014-10-29 DIAGNOSIS — L309 Dermatitis, unspecified: Secondary | ICD-10-CM | POA: Diagnosis present

## 2014-10-29 DIAGNOSIS — Z7951 Long term (current) use of inhaled steroids: Secondary | ICD-10-CM | POA: Diagnosis not present

## 2014-10-29 DIAGNOSIS — I10 Essential (primary) hypertension: Secondary | ICD-10-CM | POA: Diagnosis present

## 2014-10-29 DIAGNOSIS — Z046 Encounter for general psychiatric examination, requested by authority: Secondary | ICD-10-CM | POA: Diagnosis not present

## 2014-10-29 DIAGNOSIS — G4733 Obstructive sleep apnea (adult) (pediatric): Secondary | ICD-10-CM | POA: Diagnosis present

## 2014-10-29 DIAGNOSIS — F250 Schizoaffective disorder, bipolar type: Secondary | ICD-10-CM | POA: Diagnosis present

## 2014-10-29 DIAGNOSIS — N189 Chronic kidney disease, unspecified: Secondary | ICD-10-CM | POA: Diagnosis present

## 2014-10-29 DIAGNOSIS — Z7982 Long term (current) use of aspirin: Secondary | ICD-10-CM | POA: Diagnosis not present

## 2014-10-29 DIAGNOSIS — K219 Gastro-esophageal reflux disease without esophagitis: Secondary | ICD-10-CM | POA: Diagnosis present

## 2014-10-29 DIAGNOSIS — M1710 Unilateral primary osteoarthritis, unspecified knee: Secondary | ICD-10-CM | POA: Diagnosis present

## 2014-10-29 DIAGNOSIS — Z86010 Personal history of colonic polyps: Secondary | ICD-10-CM

## 2014-10-29 LAB — LIPID PANEL
CHOLESTEROL: 129 mg/dL (ref 0–200)
HDL: 60 mg/dL (ref >40)
LDL Cholesterol: 54 mg/dL (ref 0–99)
TRIGLYCERIDES: 74 mg/dL (ref <150)
Total CHOL/HDL Ratio: 2.2 RATIO
VLDL: 15 mg/dL (ref 0–40)

## 2014-10-29 LAB — TSH: TSH: 3.029 u[IU]/mL (ref 0.350–4.500)

## 2014-10-29 LAB — GLUCOSE, CAPILLARY: GLUCOSE-CAPILLARY: 132 mg/dL — AB (ref 65–99)

## 2014-10-29 MED ORDER — ACETAMINOPHEN 325 MG PO TABS
ORAL_TABLET | ORAL | Status: AC
  Filled 2014-10-29: qty 2

## 2014-10-29 MED ORDER — GLIMEPIRIDE 2 MG PO TABS
2.0000 mg | ORAL_TABLET | Freq: Two times a day (BID) | ORAL | Status: DC
  Administered 2014-10-29 – 2014-11-05 (×14): 2 mg via ORAL
  Filled 2014-10-29 (×17): qty 1

## 2014-10-29 MED ORDER — CITALOPRAM HYDROBROMIDE 20 MG PO TABS
10.0000 mg | ORAL_TABLET | Freq: Every day | ORAL | Status: DC
  Administered 2014-10-29 – 2014-11-04 (×7): 10 mg via ORAL
  Filled 2014-10-29 (×3): qty 1
  Filled 2014-10-29 (×2): qty 2
  Filled 2014-10-29 (×3): qty 1

## 2014-10-29 MED ORDER — FENTANYL CITRATE (PF) 100 MCG/2ML IJ SOLN: 25.0000 ug | INTRAMUSCULAR | Status: DC | PRN

## 2014-10-29 MED ORDER — TETRACAINE HCL 0.5 % OP SOLN
OPHTHALMIC | Status: AC
  Filled 2014-10-29: qty 2

## 2014-10-29 MED ORDER — FAMOTIDINE 20 MG PO TABS
20.0000 mg | ORAL_TABLET | Freq: Two times a day (BID) | ORAL | Status: DC
  Administered 2014-10-29 – 2014-11-05 (×14): 20 mg via ORAL
  Filled 2014-10-29 (×5): qty 1
  Filled 2014-10-29: qty 2
  Filled 2014-10-29: qty 1
  Filled 2014-10-29: qty 2
  Filled 2014-10-29: qty 1
  Filled 2014-10-29: qty 0.5
  Filled 2014-10-29 (×3): qty 1
  Filled 2014-10-29: qty 2
  Filled 2014-10-29 (×2): qty 0.5
  Filled 2014-10-29: qty 1

## 2014-10-29 MED ORDER — FUROSEMIDE 20 MG PO TABS: 40.0000 mg | ORAL_TABLET | Freq: Every day | ORAL | Status: DC | PRN

## 2014-10-29 MED ORDER — FLUPHENAZINE HCL 5 MG PO TABS
10.0000 mg | ORAL_TABLET | Freq: Every day | ORAL | Status: DC
  Administered 2014-10-29 – 2014-10-30 (×2): 10 mg via ORAL
  Filled 2014-10-29 (×2): qty 2

## 2014-10-29 MED ORDER — SPIRONOLACTONE 25 MG PO TABS
25.0000 mg | ORAL_TABLET | Freq: Every day | ORAL | Status: DC
  Administered 2014-10-30 – 2014-11-05 (×7): 25 mg via ORAL
  Filled 2014-10-29 (×8): qty 1

## 2014-10-29 MED ORDER — LINAGLIPTIN 5 MG PO TABS
5.0000 mg | ORAL_TABLET | Freq: Every day | ORAL | Status: DC
Start: 2014-10-30 — End: 2014-11-05
  Administered 2014-10-30 – 2014-11-05 (×7): 5 mg via ORAL
  Filled 2014-10-29 (×9): qty 1

## 2014-10-29 MED ORDER — ACETAMINOPHEN 325 MG PO TABS
650.0000 mg | ORAL_TABLET | Freq: Four times a day (QID) | ORAL | Status: DC | PRN
  Administered 2014-10-30 – 2014-11-05 (×12): 650 mg via ORAL
  Filled 2014-10-29 (×12): qty 2

## 2014-10-29 MED ORDER — TETRACAINE HCL 0.5 % OP SOLN
2.0000 [drp] | Freq: Once | OPHTHALMIC | Status: AC
  Administered 2014-10-29: 2 [drp] via OPHTHALMIC

## 2014-10-29 MED ORDER — ATORVASTATIN CALCIUM 20 MG PO TABS
20.0000 mg | ORAL_TABLET | Freq: Every day | ORAL | Status: DC
  Administered 2014-10-29 – 2014-11-04 (×7): 20 mg via ORAL
  Filled 2014-10-29 (×7): qty 1

## 2014-10-29 MED ORDER — INSULIN GLARGINE 100 UNIT/ML ~~LOC~~ SOLN
15.0000 [IU] | Freq: Every day | SUBCUTANEOUS | Status: DC
  Administered 2014-10-29 – 2014-11-04 (×7): 15 [IU] via SUBCUTANEOUS
  Filled 2014-10-29 (×10): qty 0.15

## 2014-10-29 MED ORDER — RISPERIDONE 1 MG PO TABS
0.5000 mg | ORAL_TABLET | Freq: Two times a day (BID) | ORAL | Status: DC
Start: 2014-10-29 — End: 2014-10-30
  Administered 2014-10-29: 0.5 mg via ORAL
  Filled 2014-10-29: qty 1

## 2014-10-29 MED ORDER — LORAZEPAM 0.5 MG PO TABS
0.5000 mg | ORAL_TABLET | Freq: Three times a day (TID) | ORAL | Status: DC
  Administered 2014-10-29 – 2014-10-31 (×6): 0.5 mg via ORAL
  Filled 2014-10-29 (×6): qty 1

## 2014-10-29 MED ORDER — ONDANSETRON HCL 4 MG/2ML IJ SOLN: 4.0000 mg | Freq: Once | INTRAMUSCULAR | Status: DC | PRN

## 2014-10-29 MED ORDER — TETRACAINE HCL 0.5 % OP SOLN: 2.0000 [drp] | Freq: Every morning | OPHTHALMIC | Status: DC

## 2014-10-29 MED ORDER — MAGNESIUM HYDROXIDE 400 MG/5ML PO SUSP: 30.0000 mL | Freq: Every day | ORAL | Status: DC | PRN

## 2014-10-29 MED ORDER — ACETAMINOPHEN 325 MG PO TABS: 650.0000 mg | ORAL_TABLET | ORAL | Status: DC | PRN

## 2014-10-29 MED ORDER — ALUM & MAG HYDROXIDE-SIMETH 200-200-20 MG/5ML PO SUSP: 30.0000 mL | ORAL | Status: DC | PRN

## 2014-10-29 NOTE — ED Notes (Signed)
Report called to beh med, pt admitted per md order.

## 2014-10-29 NOTE — ED Notes (Signed)
BEHAVIORAL HEALTH ROUNDING Patient sleeping: No. Patient alert and oriented: yes Behavior appropriate: Yes.  ; If no, describe:  Nutrition and fluids offered: yes Toileting and hygiene offered: Yes  Sitter present: q15 minute observations and security camera monitoring Law enforcement present: Yes  ODS  

## 2014-10-29 NOTE — ED Notes (Signed)
Pt. Noted sleeping in room. No complaints or concerns voiced. No distress or abnormal behavior noted. Will continue to monitor with security cameras. Q 15 minute rounds continue. 

## 2014-10-29 NOTE — [facility] Group Notes (Signed)
[facility] Group Notes:  (Nursing/MHT/Case Management/Adjunct)  Date:  10/29/2014  Time:  9:47 PM  Type of Therapy:  Psychoeducational Skills   Summary of Progress/Problems:  Kathi Ludwig 10/29/2014, 9:47 PM

## 2014-10-29 NOTE — ED Notes (Signed)
ED BHU [facility] Is the patient under IVC or is there intent for IVC: Yes.   Is the patient medically cleared: Yes.   Is there vacancy in the ED BHU: Yes.   Is the population mix appropriate for patient: Yes.   Is the patient awaiting placement in inpatient or outpatient setting: Yes.  Group home/inpt admission  Has the patient had a psychiatric consult: Yes.   Survey of unit performed for contraband, proper placement and condition of furniture, tampering with fixtures in bathroom, shower, and each patient room: Yes.  ; Findings:  APPEARANCE/BEHAVIOR Calm and cooperative NEURO ASSESSMENT Orientation: oriented x3  Denies pain Hallucinations: No.None noted (Hallucinations) Speech: Normal Gait: normal RESPIRATORY ASSESSMENT Even  Unlabored respirations  CARDIOVASCULAR ASSESSMENT Pulses equal   regular rate  Skin warm and dry   GASTROINTESTINAL ASSESSMENT no GI complaint EXTREMITIES Full ROM  PLAN OF CARE Provide calm/safe environment. Vital signs assessed twice daily. ED BHU Assessment once each 12-hour shift. Collaborate with intake RN daily or as condition indicates. Assure the ED provider has rounded once each shift. Provide and encourage hygiene. Provide redirection as needed. Assess for escalating behavior; address immediately and inform ED provider.  Assess family dynamic and appropriateness for visitation as needed: Yes.  ; If necessary, describe findings:  Educate the patient/family about BHU procedures/visitation: Yes.  ; If necessary, describe findings:

## 2014-10-29 NOTE — ED Notes (Signed)
Pt. Noted in room. Pt. Urinated in bed. Pt. And bed cleaned and clothing and bed changed. No distress or abnormal behavior noted. Will continue to monitor with security cameras. Q 15 minute rounds continue.

## 2014-10-29 NOTE — ED Notes (Signed)
Lunch provided along with an extra drink  Pt observed with no unusual behavior  Appropriate to stimulation  No verbalized needs or concerns at this time  NAD assessed  Continue to monitor 

## 2014-10-29 NOTE — ED Notes (Signed)
Report received from Amy RN. Patient care assumed. Patient/RN introduction complete. Will continue to monitor.  

## 2014-10-29 NOTE — ED Notes (Signed)
ENVIRONMENTAL ASSESSMENT Potentially harmful objects out of patient reach: Yes.   Personal belongings secured: Yes.   Patient dressed in hospital provided attire only: Yes.   Plastic bags out of patient reach: Yes.   Patient care equipment (cords, cables, call bells, lines, and drains) shortened, removed, or accounted for: Yes.   Equipment and supplies removed from bottom of stretcher: Yes.   Potentially toxic materials out of patient reach: Yes.   Sharps container removed or out of patient reach: Yes.     BEHAVIORAL HEALTH ROUNDING Patient sleeping: No. Patient alert and oriented: yes Behavior appropriate: Yes.  ; If no, describe:  Nutrition and fluids offered: yes Toileting and hygiene offered: Yes  Sitter present: q15 minute observations and security camera monitoring Law enforcement present: Yes  ODS  

## 2014-10-29 NOTE — ED Notes (Signed)
She has had a visit from her ACTT team member  CSW Claudine observed the visit

## 2014-10-29 NOTE — ED Notes (Signed)
Pt observed sitting in a recliner in the dayroom    Appropriate to stimulation  No verbalized needs or concerns at this time  NAD assessed  Continue to monitor
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
## 2014-10-29 NOTE — Progress Notes (Signed)
LCSW received call from [facility] and requested to visit with patient. LCSW and PSI met with patient. Patient was very forward in discussing her future housing needs , she relayed she has a boyfriend and is going to live in the [place] apartment on [address]. She is going to get her monies back from Group home ,live in a hotel and then move into the [facility] October 1st. In discussion with PSI- she is willing to see the worker Delrae Alfred again. Next discussion patient started talking about the air conditioning will no prevent her from speaking, she agreed to meet with CSW in 2 hours and to please adjust her [facility] unit ( the room appeared normal) LCSW did let staff know about temperature concerns.  Meeting was discontinued due to acute psychosis- patient was not able to state where she has lived or the group home provider's name or her sons phone number. LCSW will consult with Dr C for possible admit to [facility]

## 2014-10-29 NOTE — ED Notes (Signed)
Assessment completed  Am meds administered as ordered
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
## 2014-10-29 NOTE — Progress Notes (Signed)
Duplicate orders for Glimepiride and Glipizide from home medication list.  Unable to obtain accurate faxed list from previous [facility] or Nursing Home.  Spoke with Dr. Karma Greaser who discontinued the Glipizide and will recheck Glucose tonight.
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
## 2014-10-29 NOTE — ED Notes (Signed)
I have gone out and talked with her several times and she continues to stand in front of the glass at the nurse's station  Pt verbalizes no pain, concerns or needs  "I just want to stand here and watch Ya'll."

## 2014-10-29 NOTE — Progress Notes (Signed)
LCSW called son and informed him patient will be admitted this evening to [facility]. LCSW e-mailed contact information to [facility]  who will forward info to appropriate  assigned  LCSW

## 2014-10-29 NOTE — ED Notes (Signed)
Supper provided along with an extra drink  Pt observed with no unusual behavior  Appropriate to stimulation  No verbalized needs or concerns at this time  NAD assessed  Continue to monitor 

## 2014-10-29 NOTE — ED Notes (Signed)
Breakfast provided  Pt observed with no unusual behavior  Appropriate to stimulation  No verbalized needs or concerns at this time  NAD assessed  Continue to monitor 

## 2014-10-29 NOTE — ED Notes (Signed)
Pt. C/o discomfort in right eye. Dr. Owens Shark consulted, will see pt. Meds ordered.

## 2014-10-29 NOTE — ED Notes (Signed)
Pt observed with no unusual behavior  Appropriate to stimulation  No verbalized needs or concerns at this time  NAD assessed  Continue to monitor  [facility] Patient sleeping: No. Patient alert and oriented: yes Behavior appropriate: Yes.  ; If no, describe:  Nutrition and fluids offered: yes Toileting and hygiene offered: Yes  Sitter present: q15 minute observations and security camera monitoring Law enforcement present: Yes  ODS

## 2014-10-30 ENCOUNTER — Encounter: Payer: Self-pay | Admitting: Psychiatry

## 2014-10-30 DIAGNOSIS — M1710 Unilateral primary osteoarthritis, unspecified knee: Secondary | ICD-10-CM | POA: Diagnosis present

## 2014-10-30 DIAGNOSIS — IMO0002 Reserved for concepts with insufficient information to code with codable children: Secondary | ICD-10-CM | POA: Diagnosis present

## 2014-10-30 DIAGNOSIS — J449 Chronic obstructive pulmonary disease, unspecified: Secondary | ICD-10-CM | POA: Diagnosis present

## 2014-10-30 DIAGNOSIS — G4733 Obstructive sleep apnea (adult) (pediatric): Secondary | ICD-10-CM | POA: Diagnosis present

## 2014-10-30 DIAGNOSIS — R32 Unspecified urinary incontinence: Secondary | ICD-10-CM | POA: Diagnosis present

## 2014-10-30 DIAGNOSIS — K219 Gastro-esophageal reflux disease without esophagitis: Secondary | ICD-10-CM | POA: Diagnosis present

## 2014-10-30 LAB — URINE DRUG SCREEN, QUALITATIVE (ARMC ONLY)
Amphetamines, Ur Screen: NOT DETECTED
BARBITURATES, UR SCREEN: NOT DETECTED
Benzodiazepine, Ur Scrn: POSITIVE — AB
CANNABINOID 50 NG, UR ~~LOC~~: NOT DETECTED
COCAINE METABOLITE, UR ~~LOC~~: NOT DETECTED
MDMA (Ecstasy)Ur Screen: NOT DETECTED
Methadone Scn, Ur: NOT DETECTED
Opiate, Ur Screen: NOT DETECTED
Phencyclidine (PCP) Ur S: NOT DETECTED
TRICYCLIC, UR SCREEN: NOT DETECTED

## 2014-10-30 LAB — GLUCOSE, CAPILLARY
GLUCOSE-CAPILLARY: 222 mg/dL — AB (ref 65–99)
Glucose-Capillary: 111 mg/dL — ABNORMAL HIGH (ref 65–99)
Glucose-Capillary: 149 mg/dL — ABNORMAL HIGH (ref 65–99)

## 2014-10-30 LAB — HEMOGLOBIN A1C: HEMOGLOBIN A1C: 8.3 % — AB (ref 4.0–6.0)

## 2014-10-30 MED ORDER — SENNA 8.6 MG PO TABS
2.0000 | ORAL_TABLET | Freq: Every day | ORAL | Status: DC
  Administered 2014-10-30 – 2014-11-03 (×4): 17.2 mg via ORAL
  Filled 2014-10-30 (×5): qty 2

## 2014-10-30 MED ORDER — BENZTROPINE MESYLATE 1 MG PO TABS
0.5000 mg | ORAL_TABLET | Freq: Two times a day (BID) | ORAL | Status: DC
  Administered 2014-10-30 – 2014-11-05 (×13): 0.5 mg via ORAL
  Filled 2014-10-30 (×15): qty 1

## 2014-10-30 MED ORDER — LISINOPRIL 5 MG PO TABS
5.0000 mg | ORAL_TABLET | ORAL | Status: DC
  Administered 2014-10-31 – 2014-11-04 (×5): 5 mg via ORAL
  Filled 2014-10-30 (×5): qty 1

## 2014-10-30 MED ORDER — DOCUSATE SODIUM 100 MG PO CAPS
200.0000 mg | ORAL_CAPSULE | Freq: Two times a day (BID) | ORAL | Status: DC
  Administered 2014-10-30 – 2014-11-04 (×10): 200 mg via ORAL
  Filled 2014-10-30 (×11): qty 2

## 2014-10-30 MED ORDER — ASPIRIN EC 81 MG PO TBEC
81.0000 mg | DELAYED_RELEASE_TABLET | Freq: Every day | ORAL | Status: DC
  Administered 2014-10-30 – 2014-11-05 (×7): 81 mg via ORAL
  Filled 2014-10-30 (×7): qty 1

## 2014-10-30 MED ORDER — INSULIN ASPART 100 UNIT/ML ~~LOC~~ SOLN
0.0000 [IU] | Freq: Three times a day (TID) | SUBCUTANEOUS | Status: DC
  Filled 2014-10-30: qty 1

## 2014-10-30 MED ORDER — INSULIN ASPART 100 UNIT/ML ~~LOC~~ SOLN: 0.0000 [IU] | Freq: Every day | SUBCUTANEOUS | Status: DC

## 2014-10-30 MED ORDER — METOPROLOL SUCCINATE ER 25 MG PO TB24
50.0000 mg | ORAL_TABLET | Freq: Every day | ORAL | Status: DC
  Administered 2014-10-30 – 2014-11-05 (×7): 50 mg via ORAL
  Filled 2014-10-30 (×8): qty 2

## 2014-10-30 MED ORDER — MOMETASONE FURO-FORMOTEROL FUM 100-5 MCG/ACT IN AERO
2.0000 | INHALATION_SPRAY | Freq: Two times a day (BID) | RESPIRATORY_TRACT | Status: DC
  Administered 2014-10-30 – 2014-11-05 (×12): 2 via RESPIRATORY_TRACT
  Filled 2014-10-30: qty 8.8

## 2014-10-30 MED ORDER — NEPAFENAC 0.3 % OP SUSP
1.0000 [drp] | Freq: Every day | OPHTHALMIC | Status: DC
  Administered 2014-11-01 – 2014-11-05 (×5): 1 [drp] via OPHTHALMIC
  Filled 2014-10-30: qty 1.7

## 2014-10-30 MED ORDER — DIFLUPREDNATE 0.05 % OP EMUL
1.0000 [drp] | Freq: Every day | OPHTHALMIC | Status: DC
  Administered 2014-11-01 – 2014-11-05 (×5): 1 [drp] via OPHTHALMIC
  Filled 2014-10-30 (×2): qty 5

## 2014-10-30 NOTE — Progress Notes (Signed)
Pt has had no episodes of incontinence after staff began regularly encouraging her to toilet.

## 2014-10-30 NOTE — [facility] Group Notes (Signed)
[place] Group Notes:  (Nursing/MHT/Case Management/Adjunct)  Date:  10/30/2014  Time:  1:50 PM  Type of Therapy:  Psychoeducational Skills  Participation Level:  Minimal  Participation Quality:  Sharing  Affect:  Tearful  Cognitive:  Confused  Insight:  Lacking  Engagement in Group:  Improving  Modes of Intervention:  Discussion, Education and Support  Summary of Progress/Problems:  Adela Lank Tadarrius Burch 10/30/2014, 1:50 PM

## 2014-10-30 NOTE — [facility] Suicide Risk Assessment (Signed)
[facility] Admission Suicide Risk Assessment   Nursing information obtained from:    Demographic factors:    Current Mental Status:    Loss Factors:    Historical Factors:    Risk Reduction Factors:    Total Time spent with patient: 1 hour Principal Problem: Schizoaffective disorder, bipolar type with good prognostic features Diagnosis:   Patient Active Problem List   Diagnosis Date Noted  . Schizoaffective disorder, bipolar type with good prognostic features [F25.0] 10/30/2014  . Urinary incontinence [R32] 10/30/2014  . GERD (gastroesophageal reflux disease) [K21.9] 10/30/2014  . OSA (obstructive sleep apnea) [G47.33] 10/30/2014  . Osteoarthrosis, unspecified whether generalized or localized, involving lower leg [M17.9] 10/30/2014  . COPD (chronic obstructive pulmonary disease) [J44.9] 10/30/2014  . Chronic diastolic CHF (congestive heart failure) [I50.32] 05/15/2014  . Obesity [E66.9]   . History of colon polyps [Z86.010] 03/09/2013  . Renal mass [N28.89] 06/26/2011  . 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 Hypertension [I10] 06/24/2011  . Diabetes mellitus [E11.9] 06/24/2011     Continued Clinical Symptoms:  Alcohol Use Disorder Identification Test Final Score (AUDIT): 0 The "Alcohol Use Disorders Identification Test", Guidelines for Use in Primary Care, Second Edition.  World Pharmacologist [facility]). Score between 0-7:  no or low risk or alcohol related problems. Score between 8-15:  moderate risk of alcohol related problems. Score between 16-19:  high risk of alcohol related problems. Score 20 or above:  warrants further diagnostic evaluation for alcohol dependence and treatment.   CLINICAL FACTORS:   Schizophrenia:   Paranoid or undifferentiated type Currently Psychotic Previous Psychiatric Diagnoses and Treatments Medical Diagnoses and Treatments/Surgeries    Psychiatric Specialty Exam: Physical Exam  ROS  Blood pressure 140/91, pulse 112, temperature  98.2 F (36.8 C), temperature source Oral, resp. rate 20, height 5\' 6"  (1.676 m), weight 123.832 kg (273 lb).Body mass index is 44.08 kg/(m^2).    COGNITIVE FEATURES THAT CONTRIBUTE TO RISK:  None    SUICIDE RISK:   Moderate:  Frequent suicidal ideation with limited intensity, and duration, some specificity in terms of plans, no associated intent, good self-control, limited dysphoria/symptomatology, some risk factors present, and identifiable protective factors, including available and accessible social support.  PLAN OF CARE: admit to [facility]:  Established Problem, Worsening (2)  I certify that inpatient services furnished can reasonably be expected to improve the patient's condition.   Hildred Priest 10/30/2014, 2:15 PM

## 2014-10-30 NOTE — Tx Team (Signed)
Initial Interdisciplinary Treatment Plan   PATIENT STRESSORS: Health problems Medication change or noncompliance   PATIENT STRENGTHS: Ability for insight Capable of independent living Supportive family/friends   PROBLEM LIST: Problem List/Patient Goals Date to be addressed Date deferred Reason deferred Estimated date of resolution  Mood Liability 10/29/2014     Aggression 10/29/2014                                                DISCHARGE CRITERIA:  Ability to meet basic life and health needs Adequate post-discharge living arrangements Improved stabilization in mood, thinking, and/or behavior Reduction of life-threatening or endangering symptoms to within safe limits Safe-care adequate arrangements made  PRELIMINARY DISCHARGE PLAN: Attend aftercare/continuing care group Return to previous living arrangement  PATIENT/FAMIILY INVOLVEMENT: This treatment plan has been presented to and reviewed with the patient, [name] [name].  The patient have been given the opportunity to ask questions and make suggestions.  Reino Lybbert T Angelito Hopping 10/30/2014, 1:19 AM

## 2014-10-30 NOTE — Progress Notes (Signed)
Pt had an episode of urinary incontinence in the dayroom. Staff assisted pt with cleaning herself and changing. Will continue to monitor for needs/safety.

## 2014-10-30 NOTE — Progress Notes (Signed)
CBG at bedtime =132, UDS Specimen sent about 0200 and she is positive for Benzodiazepine, patient is taking 0.5 mg of Ativan 3 times daily.

## 2014-10-30 NOTE — Progress Notes (Signed)
Patient asking for Eye Drop

## 2014-10-30 NOTE — Progress Notes (Signed)
Recreation Therapy Notes  Date: 09.07.16 Time: 3:00 pm Location: Craft Room  Group Topic: Self-esteem  Goal Area(s) Addresses:  Patients will write at least one positive trait.  Patients will verbalize benefit of having good self-esteem.  Behavioral Response: Attentive, Disruptive  Intervention: I Am  Activity: Patients were given a worksheet with the letter I on it and instructed to write as many positive traits about themselves inside the letter.  Education:LRT educated patients on ways to increase their self-esteem.   Education Outcome: In group clarification offered  Clinical Observations/Feedback: Patient did not work on worksheet. Patient verbalized approximately 3 positive traits. Patient did not contribute to group discussion. LRT had to redirect patient multiple times. Patient difficult to redirect.  Leonette Monarch, LRT/CTRS 10/30/2014 4:40 PM

## 2014-10-30 NOTE — Plan of Care (Signed)
Problem: Ineffective individual coping Goal: STG: Patient will remain free from self harm Outcome: Progressing Medications administered as ordered by the physician, no PRN given, 15 minute checks maintained for safety, clinical and moral support provided, patient encouraged to continue to express feelings and demonstrate safe care. Patient remain free from harm, room closer to the nurses' station, will continue to monitor.

## 2014-10-30 NOTE — Plan of Care (Signed)
Problem: Ineffective individual coping Goal: STG-Increase in ability to manage activities of daily living Outcome: Not Progressing Pt was in need of monitoring while cleaning herself up this a.m. Pt required encouragement that she could complete ADLs by herself.

## 2014-10-30 NOTE — Progress Notes (Signed)
Pt had an episode of vomiting and bowel incontinence after breakfast. Staff assisted pt with hygiene and with fresh scrubs. Pt has been pleasant and at times attention seeking. Meds given as ordered. Safety maintained. Will continue to monitor for needs/safety.
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
## 2014-10-31 LAB — GLUCOSE, CAPILLARY
GLUCOSE-CAPILLARY: 103 mg/dL — AB (ref 65–99)
GLUCOSE-CAPILLARY: 93 mg/dL (ref 65–99)
GLUCOSE-CAPILLARY: 194 mg/dL — AB (ref 65–99)
Glucose-Capillary: 113 mg/dL — ABNORMAL HIGH (ref 65–99)

## 2014-10-31 MED ORDER — FLUPHENAZINE HCL 5 MG PO TABS
15.0000 mg | ORAL_TABLET | Freq: Every day | ORAL | Status: DC
  Administered 2014-10-31 – 2014-11-04 (×5): 15 mg via ORAL
  Filled 2014-10-31 (×6): qty 3

## 2014-10-31 MED ORDER — FLUPHENAZINE DECANOATE 25 MG/ML IJ SOLN
25.0000 mg | INTRAMUSCULAR | Status: DC
  Filled 2014-10-31: qty 1

## 2014-10-31 MED ORDER — LORAZEPAM 0.5 MG PO TABS
0.5000 mg | ORAL_TABLET | Freq: Two times a day (BID) | ORAL | Status: DC
  Administered 2014-11-01 – 2014-11-05 (×8): 0.5 mg via ORAL
  Filled 2014-10-31 (×9): qty 1

## 2014-10-31 MED ORDER — LORAZEPAM 1 MG PO TABS
1.0000 mg | ORAL_TABLET | Freq: Every day | ORAL | Status: DC
  Administered 2014-10-31 – 2014-11-04 (×5): 1 mg via ORAL
  Filled 2014-10-31 (×5): qty 1

## 2014-10-31 NOTE — [facility] Group Notes (Signed)
[facility] Group Notes:  (Nursing/MHT/Case Management/Adjunct)  Date:  10/31/2014  Time:  2:30 PM  Type of Therapy:  Group Therapy  Participation Level:  Active  Participation Quality:  Appropriate and Sharing  Affect:  Appropriate  Cognitive:  Appropriate  Insight:  Appropriate  Engagement in Group:  Engaged  Modes of Intervention:  Activity  Summary of Progress/Problems:  [name] [name] 10/31/2014, 2:30 PM

## 2014-10-31 NOTE — Progress Notes (Addendum)
D: Pt is awake and active in the milieu this evening. Pt mood is labile and her affect is bizarre/irrritable. Pt denies SI/HI and AVH at this time. Pt is somewhat intrusive and needs some redirection from staff. Pt repeats requests multiple times and monopolizes staff time.  A: Writer provided emotional support and administered medications as prescribed. Writer encouraged sleep as well.  R: Pt is ultimately redirectable and went to bed after some discussion about information she requested from the chart.

## 2014-10-31 NOTE — Progress Notes (Addendum)
[facility] MD Progress Note  10/31/2014 12:55 PM [name] [name]  MRN:  [id number] Subjective:  [name] says she feels great. She is requesting to be discharged today. She says she has changed her legal guardians now and she has assigned her older son as her guardian and beneficiary here she is states that she is going to sued the group home and other people that bother her prior to admission.  She says the social worker is helping her trying to "mess her son out".  Patient plans to call the act team so they can take her to a bus station and then go to [place] to live with her older son [name] [name].  Patient stated that her primary care provider Dr. Brynda Greathouse  was making her stay at the NH even though she didn't meet the criteria because he wanted her money from her SS check.  Per [place]: Patient was going into the neighbor's houses and was knocking on the door trying to preach to gospel. The neighbor started calling 911 and complaining about her. The patient also was calling constantly 911.  This is stating that the are hesitant about taking her back.  Per guardian: Long history of medication refusal. Guardian reports feeling frustrated as they have tried different living arrangements without success. When patient was living on her own patient was also constantly calling 911 and continued to refuse medications.  The patient claims she is going to live with her other son and her legal guardian is states that his brother is not capable of taking care of their mother at this time as he has 2 newborns.   D: Pt is awake and active in the milieu this evening. Pt mood is labile and her affect is bizarre/irrritable. Pt denies SI/HI and AVH at this time. Pt is somewhat intrusive and needs some redirection from staff. Pt repeats requests multiple times and monopolizes staff time.  Principal Problem: Schizoaffective disorder, bipolar type with good prognostic features Diagnosis:   Patient Active Problem List   Diagnosis Date Noted   . Schizoaffective disorder, bipolar type with good prognostic features [F25.0] 10/30/2014  . Urinary incontinence [R32] 10/30/2014  . GERD (gastroesophageal reflux disease) [K21.9] 10/30/2014  . OSA (obstructive sleep apnea) [G47.33] 10/30/2014  . Osteoarthrosis, unspecified whether generalized or localized, involving lower leg [M17.9] 10/30/2014  . COPD (chronic obstructive pulmonary disease) [J44.9] 10/30/2014  . Chronic diastolic CHF (congestive heart failure) [I50.32] 05/15/2014  . Obesity [E66.9]   . History of colon polyps [Z86.010] 03/09/2013  . Renal mass [N28.89] 06/26/2011  . 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 Hypertension [I10] 06/24/2011  . Diabetes mellitus [E11.9] 06/24/2011   Total Time spent with patient: 30 minutes   Past Medical History:  Past Medical History  Diagnosis Date  . DM2 (diabetes mellitus, type 2)   . Hypertension   . Hypercholesteremia   . Schizophrenia   . Osteoarthritis   . Bursitis   . OSA (obstructive sleep apnea)     does not use machine  . Left ventricular outflow tract obstruction     a. echo 03/2014: EF 60-65%, hypernamic LV systolic fxn, mod LVH w/ LVOT gradient estimated at 68 mm Hg w/ valsalva, very small LV internal cavity size, mildly increased LV posterior wall thickness, mild Ao valve scl w/o stenosis, diastolic dysfunction, normal RVSP  . LVH (left ventricular hypertrophy)     a. echo suggests long standing uncontrolled htn. she will not do well when dehydrated, LV cavity obliteration  .  Obesity   . CHF (congestive heart failure)   . Anxiety   . COPD (chronic obstructive pulmonary disease)   . Bronchitis   . CAD (coronary artery disease)   . Tremors of nervous system   . GERD (gastroesophageal reflux disease)   . Environmental allergies   . Arthritis   . Depression   . Chronic kidney disease     shadow on x-ray  . Muscle weakness   . Chronic cough   . Lower extremity edema   . Wheezing   . On supplemental  oxygen therapy     AS NEEDED    Past Surgical History  Procedure Laterality Date  . Tonsillectomy    . Knee reconstruction, medial patellar femoral ligament    . Cholecystectomy    . Colonoscopy  04/2011    [facility] per patient incomplete  . Tonsillectomy    . Cataract extraction w/phaco Left 09/10/2014    Procedure: CATARACT EXTRACTION PHACO AND INTRAOCULAR LENS PLACEMENT (IOC);  Surgeon: Birder Robson, MD;  Location: [facility] ORS;  Service: Ophthalmology;  Laterality: Left;  [place]: 00:44 AP%: 22.8 CDE: 10.24 Fluid lot #[id number]  . Joint replacement      TKR  . Eye surgery    . Cataract extraction w/phaco Right 10/15/2014    Procedure: CATARACT EXTRACTION PHACO AND INTRAOCULAR LENS PLACEMENT (IOC);  Surgeon: Birder Robson, MD;  Location: [facility] ORS;  Service: Ophthalmology;  Laterality: Right;  [place]: 00:52 AP:40.1 CDE:11.83 LOT QMVH #[id number]   Family History:  Family History  Problem Relation Age of Onset  . Colon cancer Neg Hx   . Liver disease Neg Hx   . Heart attack Mother    Social History:  History  Alcohol Use  . Yes    Comment: occ.     History  Drug Use No    Social History   Social History  . Marital Status: Widowed    Spouse Name: N/A  . Number of Children: 2  . Years of Education: N/A   Social History Main Topics  . Smoking status: Former Research scientist (life sciences)  . Smokeless tobacco: Never Used  . Alcohol Use: Yes     Comment: occ.  . Drug Use: No  . Sexual Activity: Not Currently   Other Topics Concern  . None   Social History Narrative   Additional History:    Sleep: Poor  Appetite:  Good   Assessment:   Musculoskeletal: Strength & Muscle Tone: within normal limits Gait & Station: broad based Patient leans: N/A   Psychiatric Specialty Exam: Physical Exam  Review of Systems  HENT: Negative.   Eyes: Negative.   Respiratory: Negative.   Cardiovascular: Negative.   Gastrointestinal: Negative.   Genitourinary: Negative.   Musculoskeletal: Negative.    Skin: Negative.   Neurological: Negative.   Endo/Heme/Allergies: Negative.   Psychiatric/Behavioral: Negative.     Blood pressure 123/64, pulse 63, temperature 98 F (36.7 C), temperature source Oral, resp. rate 20, height 5\' 6"  (1.676 m), weight 123.832 kg (273 lb).Body mass index is 44.08 kg/(m^2).  General Appearance: Disheveled  Eye Contact::  Good  Speech:  Mildly increased volume, decreased rate, increased tone  Volume:  Increased  Mood:  Euphoric  Affect:  Congruent  Thought Process:  Circumstantial  Orientation:  Full (Time, Place, and Person)  Thought Content:  Paranoid Ideation  Suicidal Thoughts:  No  Homicidal Thoughts:  No  Memory:  Immediate;   Good Recent;   Good Remote;   Good  Judgement:  Impaired  Insight:  Lacking  Psychomotor Activity:  Increased  Concentration:  NA  Recall:  NA  Fund of Knowledge:Fair  Language: Good  Akathisia:  No  Handed:    AIMS (if indicated):     Assets:  Financial Resources/Insurance Housing Social Support  ADL's:  Intact  Cognition: WNL  Sleep:  Number of Hours: 3     Current Medications: Current Facility-Administered Medications  Medication Dose Route Frequency Provider Last Rate Last Dose  . acetaminophen (TYLENOL) tablet 650 mg  650 mg Oral Q6H PRN Gonzella Lex, MD   650 mg at 10/31/14 0313  . alum & mag hydroxide-simeth (MAALOX/MYLANTA) 200-200-20 MG/5ML suspension 30 mL  30 mL Oral Q4H PRN Gonzella Lex, MD      . aspirin EC tablet 81 mg  81 mg Oral Daily Hildred Priest, MD   81 mg at 10/31/14 1030  . atorvastatin (LIPITOR) tablet 20 mg  20 mg Oral QHS Gonzella Lex, MD   20 mg at 10/30/14 2100  . benztropine (COGENTIN) tablet 0.5 mg  0.5 mg Oral BID Hildred Priest, MD   0.5 mg at 10/31/14 1028  . citalopram (CELEXA) tablet 10 mg  10 mg Oral QHS Gonzella Lex, MD   10 mg at 10/30/14 2100  . Difluprednate 0.05 % EMUL 1 drop  1 drop Ophthalmic Daily Hildred Priest, MD   1 drop at  10/30/14 1715  . docusate sodium (COLACE) capsule 200 mg  200 mg Oral BID Hildred Priest, MD   200 mg at 10/31/14 1027  . famotidine (PEPCID) tablet 20 mg  20 mg Oral BID Gonzella Lex, MD   20 mg at 10/31/14 1029  . fluPHENAZine (PROLIXIN) tablet 10 mg  10 mg Oral QHS Gonzella Lex, MD   10 mg at 10/30/14 2101  . furosemide (LASIX) tablet 40 mg  40 mg Oral Daily PRN Gonzella Lex, MD      . glimepiride (AMARYL) tablet 2 mg  2 mg Oral BID Gonzella Lex, MD   2 mg at 10/31/14 1030  . insulin aspart (novoLOG) injection 0-5 Units  0-5 Units Subcutaneous QHS Hildred Priest, MD   0 Units at 10/30/14 2105  . insulin aspart (novoLOG) injection 0-9 Units  0-9 Units Subcutaneous TID WC Hildred Priest, MD   0 Units at 10/30/14 1711  . insulin glargine (LANTUS) injection 15 Units  15 Units Subcutaneous QHS Gonzella Lex, MD   15 Units at 10/30/14 2101  . linagliptin (TRADJENTA) tablet 5 mg  5 mg Oral Daily Gonzella Lex, MD   5 mg at 10/31/14 1030  . lisinopril (PRINIVIL,ZESTRIL) tablet 5 mg  5 mg Oral BH-q7a Hildred Priest, MD   5 mg at 10/31/14 0644  . LORazepam (ATIVAN) tablet 0.5 mg  0.5 mg Oral TID Gonzella Lex, MD   0.5 mg at 10/31/14 1030  . magnesium hydroxide (MILK OF MAGNESIA) suspension 30 mL  30 mL Oral Daily PRN Gonzella Lex, MD      . metoprolol succinate (TOPROL-XL) 24 hr tablet 50 mg  50 mg Oral Daily Hildred Priest, MD   50 mg at 10/31/14 1028  . mometasone-formoterol (DULERA) 100-5 MCG/ACT inhaler 2 puff  2 puff Inhalation BID Hildred Priest, MD   2 puff at 10/31/14 [phone]  . nepafenac (ILEVRO) 0.3 % ophthalmic suspension 1 drop  1 drop Right Eye Daily Hildred Priest, MD   1 drop at 10/30/14 1715  . ondansetron (ZOFRAN) injection 4  mg  4 mg Intravenous Once PRN Alvin Critchley, MD      . senna [facility] tablet 17.2 mg  2 tablet Oral QHS Hildred Priest, MD   17.2 mg at 10/30/14 2100  . spironolactone  (ALDACTONE) tablet 25 mg  25 mg Oral Daily Gonzella Lex, MD   25 mg at 10/31/14 1028    Lab Results:  Results for orders placed or performed during the hospital encounter of 10/29/14 (from the past 48 hour(s))  Glucose, capillary     Status: Abnormal   Collection Time: 10/29/14  9:17 PM  Result Value Ref Range   Glucose-Capillary 132 (H) 65 - 99 mg/dL  Urine Drug Screen, Qualitative ([place] only)     Status: Abnormal   Collection Time: 10/30/14  2:00 AM  Result Value Ref Range   Tricyclic, Ur Screen NONE DETECTED NONE DETECTED   Amphetamines, Ur Screen NONE DETECTED NONE DETECTED   MDMA (Ecstasy)Ur Screen NONE DETECTED NONE DETECTED   Cocaine Metabolite,Ur [place] NONE DETECTED NONE DETECTED   Opiate, Ur Screen NONE DETECTED NONE DETECTED   Phencyclidine (PCP) Ur S NONE DETECTED NONE DETECTED   Cannabinoid 50 Ng, Ur [place] NONE DETECTED NONE DETECTED   Barbiturates, Ur Screen NONE DETECTED NONE DETECTED   Benzodiazepine, Ur Scrn POSITIVE (A) NONE DETECTED   Methadone Scn, Ur NONE DETECTED NONE DETECTED    Comment: (NOTE) 195  Tricyclics, urine               Cutoff 1000 ng/mL 200  Amphetamines, urine             Cutoff 1000 ng/mL 300  MDMA (Ecstasy), urine           Cutoff 500 ng/mL 400  Cocaine Metabolite, urine       Cutoff 300 ng/mL 500  Opiate, urine                   Cutoff 300 ng/mL 600  Phencyclidine (PCP), urine      Cutoff 25 ng/mL 700  Cannabinoid, urine              Cutoff 50 ng/mL 800  Barbiturates, urine             Cutoff 200 ng/mL 900  Benzodiazepine, urine           Cutoff 200 ng/mL 1000 Methadone, urine                Cutoff 300 ng/mL 1100 1200 The urine drug screen provides only a preliminary, unconfirmed 1300 analytical test result and should not be used for non-medical 1400 purposes. Clinical consideration and professional judgment should 1500 be applied to any positive drug screen result due to possible 1600 interfering substances. A more specific alternate chemical  method 1700 must be used in order to obtain a confirmed analytical result.  1800 Gas chromato graphy / mass spectrometry (GC/MS) is the preferred 1900 confirmatory method.   Glucose, capillary     Status: Abnormal   Collection Time: 10/30/14 12:12 PM  Result Value Ref Range   Glucose-Capillary 222 (H) 65 - 99 mg/dL   Comment 1 Notify RN   Glucose, capillary     Status: Abnormal   Collection Time: 10/30/14  5:03 PM  Result Value Ref Range   Glucose-Capillary 111 (H) 65 - 99 mg/dL   Comment 1 Notify RN   Glucose, capillary     Status: Abnormal   Collection Time: 10/30/14  8:54 PM  Result Value  Ref Range   Glucose-Capillary 149 (H) 65 - 99 mg/dL   Comment 1 Notify RN   Glucose, capillary     Status: Abnormal   Collection Time: 10/31/14  6:47 AM  Result Value Ref Range   Glucose-Capillary 103 (H) 65 - 99 mg/dL  Glucose, capillary     Status: Abnormal   Collection Time: 10/31/14 12:02 PM  Result Value Ref Range   Glucose-Capillary 113 (H) 65 - 99 mg/dL    Physical Findings: AIMS: Facial and Oral Movements Muscles of Facial Expression: None, normal Lips and Perioral Area: None, normal Jaw: None, normal Tongue: None, normal,Extremity Movements Upper (arms, wrists, hands, fingers): None, normal Lower (legs, knees, ankles, toes): None, normal, Trunk Movements Neck, shoulders, hips: None, normal, Overall Severity Severity of abnormal movements (highest score from questions above): None, normal Incapacitation due to abnormal movements: None, normal Patient's awareness of abnormal movements (rate only patient's report): No Awareness, Dental Status Current problems with teeth and/or dentures?: Yes Does patient usually wear dentures?: No  CIWA:  CIWA-Ar Total: 0 COWS:     Treatment Plan Summary: Daily contact with patient to assess and evaluate symptoms and progress in treatment and Medication management   69 year old African-American female with history of schizoaffective  disorder. The patient was brought into our emergency department under involuntary commitment due to worsening psychosis and manic symptoms. The patient has been noncompliance with medication regimen..  Schizoaffective disorder: Continue treatment with Prolixin  but will increase to 15 mg po qhs. Will order prolixin dec today  EPS: Patient will be reassessed started on benztropine 0.5 bid.  Dyslipidemia: Continue Lipitor 20 mg by mouth daily  Edema-congestive heart failure: Continue Lasix 40 mg daily. Continue spironolactone  Diabetes: Continue Amaryl, Lantus and tradjenta. We will order FSBS before meals and at bedtime. Will also order a supplement insulin. Hemoglobin A1c is about 8. This is likely due to noncompliance.  HTN: Continue lisinopril 5 mg and metoprolol   GERD continue Pepcid 20 mg by mouth daily  COPD: Continue daily inhaler. Hospital does not have aadvair therefore will start dulera  Precautions continue every 15 minute checks  Diet low sodium and carb controlled  Collateral information: see above. Kelly  (guardian/son) [phone].  Nyra Jabs (son) [phone] [place] [phone].     Medical Decision Making:  Established Problem, Stable/Improving (1)     Hildred Priest 10/31/2014, 12:55 PM
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
## 2014-10-31 NOTE — Progress Notes (Signed)
D- Patient denies suicidal and homicidal ideation and is pleasant, cooperative, and hyperverbal. She states, "I feel like I'm ready to go home", but she does not show evidence of insight related to why she was hospitalized. Patient is active on the unit, eats meals, attends groups, and was seen socializing with nursing students.  A- Provided a therapeutic environment, active listening and maintained safe environment.  R- Patient has no complaints at this time, no inappropriate behavior.

## 2014-10-31 NOTE — Plan of Care (Signed)
Problem: Ineffective individual coping Goal: STG: Patient will remain free from self harm Outcome: Progressing Patient denies suicidal ideation at this time. No self harm.

## 2014-10-31 NOTE — BHH Group Notes (Signed)
[facility] LCSW Group Therapy  10/31/2014 4:14 PM  Type of Therapy:  Group Therapy  Participation Level:  Minimal  Participation Quality:  Intrusive  Affect:  Labile  Cognitive:  Disorganized  Insight:  Limited  Engagement in Therapy:  Limited  Modes of Intervention:  Discussion, Education, Socialization and Support  Summary of Progress/Problems:Balance in life: Patients will discuss the concept of balance and how it looks and feels to be unbalanced. Pt will identify areas in their life that is unbalanced and ways to become more balanced. Dunia discussed why she was admitted to the hospital. 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 She states she was told that she could move to [place] with her youngest son for a month. She states she does not want her oldest son to be her guardian any longer.    [place] MSW, LCSWA  10/31/2014, 4:14 PM

## 2014-10-31 NOTE — Progress Notes (Signed)
Recreation Therapy Notes  Date: 09.08.16 Time: 4:40 pm Location: Craft Room  Group Topic: Leisure Education  Goal Area(s) Addresses:  Patient will identify activities for each letter of the alphabet. Patient will verbalize ability to integrate positive leisure into life post d/c. Patient will verbalize ability to use leisure as a Technical sales engineer.  Behavioral Response: Attentive, Interactive  Intervention: Leisure Alphabet  Activity: Patients were given a Leisure Air traffic controller and instructed to write a healthy leisure activity for each letter of the alphabet.   Education: LRT educated patient on things that are needed to participate in leisure  Education Outcome: In group clarification offered  Clinical Observations/Feedback: Patient completed approximately 50% of worksheet. Patient contributed to group discussion by stating some healthy leisure activities she wrote down. Patient had difficulty with understanding the activity. LRT and peers explained to patient.  Leonette Monarch, LRT/CTRS 10/31/2014 4:58 PM

## 2014-10-31 NOTE — Progress Notes (Signed)
Recreation Therapy Notes  INPATIENT RECREATION THERAPY ASSESSMENT  Patient Details Name: [name] [name] MRN: [id number] DOB: December 10, 1945 Today's Date: 10/31/2014  Patient Stressors: Family, Friends (Lack of friends)  Coping Skills:   Isolate, Arguments, Avoidance, Exercise, Art/Dance, Talking, Music, Sports, Other (Comment) (Reading the Bible)  Personal Challenges: Communication, Trusting Others  Leisure Interests (2+):  Music - Listen, Individual - TV, Individual - Other (Comment) (Entertain)  Awareness of Community Resources:  Yes  Community Resources:  [place]  Current Use: No  If no, Barriers?: Other (Comment) (Son doesn't give her a chance to)  Patient Strengths:  Love to smile, caring  Patient Identified Areas of Improvement:  More friends  Current Recreation Participation:  Reading teh Bible, going to church  Patient Goal for Hospitalization:  Listen to the [place] of Residence:  [place] of Residence:  [place]   Current SI (including self-harm):  No  Current HI:  No  Consent to Intern Participation: N/A   Leonette Monarch, LRT/CTRS 10/31/2014, 5:40 PM

## 2014-10-31 NOTE — Tx Team (Signed)
Interdisciplinary Treatment Plan Update (Adult)  Date:  10/31/2014 Time Reviewed:  5:15 PM  Progress in Treatment: Attending groups: Yes. Participating in groups:  Yes. Taking medication as prescribed:  Yes. Tolerating medication:  Yes. Family/Significant othe contact made:  No, will contact:  [name]  Patient understands diagnosis:  No. and As evidenced by:  Limited insight  Discussing patient identified problems/goals with staff:  Yes. Medical problems stabilized or resolved:  Yes. Denies suicidal/homicidal ideation: Yes. Issues/concerns per patient self-inventory:  Yes. Other:  New problem(s) identified: No, Describe:  NA  Discharge Plan or Barriers: Pt plans to return home and follow up with outpatient.   Reason for Continuation of Hospitalization: Medication stabilization Other; describe Psychosis   Comments:[name] says she feels great. She is requesting to be discharged today. She says she has changed her legal guardians now and she has assigned her older son as her guardian and beneficiary here she is states that she is going to sued the group home and other people that bother her prior to admission. She says the social worker is helping her trying to "mess her son out". Patient plans to call the act team so they can take her to a bus station and then go to [place] to live with her older son [name] [name]. Patient stated that her primary care provider Dr. Brynda Greathouse was making her stay at the NH even though she didn't meet the criteria because he wanted her money from her SS check. Per [place]: Patient was going into the neighbor's houses and was knocking on the door trying to preach to gospel. The neighbor started calling 911 and complaining about her. The patient also was calling constantly 911. This is stating that the are hesitant about taking her back. Per guardian: Long history of medication refusal. Guardian reports feeling frustrated as they have tried different living arrangements  without success. When patient was living on her own patient was also constantly calling 911 and continued to refuse medications. The patient claims she is going to live with her other son and her legal guardian is states that his brother is not capable of taking care of their mother at this time as he has 2 newborns. D: Pt is awake and active in the milieu this evening. Pt mood is labile and her affect is bizarre/irrritable. Pt denies SI/HI and AVH at this time. Pt is somewhat intrusive and needs some redirection from staff. Pt repeats requests multiple times and monopolizes staff time.  Estimated length of stay: 7 days   New goal(s): na  Review of initial/current patient goals per problem list:   1.  Goal(s): Patient will participate in aftercare plan * Met:  * Target date: at discharge * As evidenced by: Patient will participate within aftercare plan AEB aftercare provider and housing plan at discharge being identified.  2.  Goal (s): Patient will demonstrate decreased symptoms of psychosis. * Met: No  *  Target date: at discharge * As evidenced by: Patient will not endorse signs of psychosis or be deemed stable for discharge by MD.   Attendees: Patient:  [name] [name] 9/8/20165:15 PM  Family:   9/8/20165:15 PM  Physician:  Dr. Jerilee Hoh  9/8/20165:15 PM  Nursing:   Silva Bandy, RN  9/8/20165:15 PM  Case Manager:   9/8/20165:15 PM  Counselor:   9/8/20165:15 PM  Other:  Campbell Stall Lataria Courser,LCSWA  9/8/20165:15 PM  Other:  Everitt Amber, LRT  9/8/20165:15 PM  Other:   9/8/20165:15 PM  Other:  9/8/20165:15  PM  Other:  9/8/20165:15 PM  Other:  9/8/20165:15 PM  Other:  9/8/20165:15 PM  Other:  9/8/20165:15 PM  Other:  9/8/20165:15 PM  Other:   9/8/20165:15 PM   Scribe for Treatment Team:   Wray Kearns, MSW, LCSWA  10/31/2014, 5:15 PM

## 2014-11-01 LAB — GLUCOSE, CAPILLARY
GLUCOSE-CAPILLARY: 154 mg/dL — AB (ref 65–99)
GLUCOSE-CAPILLARY: 100 mg/dL — AB (ref 65–99)
GLUCOSE-CAPILLARY: 80 mg/dL (ref 65–99)
Glucose-Capillary: 113 mg/dL — ABNORMAL HIGH (ref 65–99)

## 2014-11-01 NOTE — Plan of Care (Signed)
Problem: Ineffective individual coping Goal: LTG: Patient will report a decrease in negative feelings Outcome: Progressing Patient denies suicidal ideation.  Goal: STG: Patient will participate in after care plan Outcome: Progressing Patient is participating in her care plan regimen.

## 2014-11-01 NOTE — [facility] Group Notes (Signed)
[place] LCSW Group Therapy  11/01/2014 3:03 PM  Type of Therapy:  Group Therapy  Participation Level:  Active  Participation Quality:  Drowsy, Intrusive and Monopolizing  Affect:  Labile  Cognitive:  Disorganized  Insight:  Lacking  Engagement in Therapy:  Distracting, Monopolizing and Off Topic  Modes of Intervention:  Limit-setting, Socialization and Support  Summary of Progress/Problems: Patient attended group and was disorganized, off topic and difficult to redirect. Patient was angry with her son and continuously talked about him and how he mistreats her. Patient was redirected several times and then closed her eyes during group but probably is not appropriately for group at this time.   Keene Breath, MSW, LCSWA 11/01/2014, 3:03 PM
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
## 2014-11-01 NOTE — Progress Notes (Signed)
Recreation Therapy Notes  Date: 09.09.16 Time: 3:00 pm Location: Craft Room  Group Topic: Coping Skills  Goal Area(s) Addresses:  Patient will participate in coping skill. Patient will verbalize benefit of using art as a coping skill.  Behavioral Response: Attentive, Left early  Intervention: Coloring  Activity: Patients were given coloring sheets and instructed to color and think about the emotions they experienced.  Education: LRT educated patients on healthy coping skills.  Education Outcome: Patient left group before LRT educated patients.  Clinical Observations/Feedback: Patient colored worksheet. Patient fell asleep and started snoring. LRT woke patient up and told her she could go to her room if she wanted to. Patient left group at approximately 3:37 pm. Patient did not return to group.  Leonette Monarch, LRT/CTRS 11/01/2014 4:34 PM

## 2014-11-01 NOTE — Progress Notes (Signed)
Patient is alert and oriented no signs of distress noted. Patient has attended groups today.  Patient has multiple request to speak with an attorney.  Patient is incontinent of urine and bowel and does require assistance with dressing.  Patient denies SI/HI at this time.  Patient denies hearing voices or having visual hallucinations at this time.  Patient observed Q16min for safety.

## 2014-11-02 DIAGNOSIS — F250 Schizoaffective disorder, bipolar type: Principal | ICD-10-CM

## 2014-11-02 LAB — GLUCOSE, CAPILLARY
GLUCOSE-CAPILLARY: 91 mg/dL (ref 65–99)
GLUCOSE-CAPILLARY: 144 mg/dL — AB (ref 65–99)
Glucose-Capillary: 87 mg/dL (ref 65–99)
Glucose-Capillary: 137 mg/dL — ABNORMAL HIGH (ref 65–99)

## 2014-11-02 MED ORDER — HYDROCERIN EX CREA
TOPICAL_CREAM | Freq: Two times a day (BID) | CUTANEOUS | Status: DC
  Administered 2014-11-04 – 2014-11-05 (×2): via TOPICAL
  Filled 2014-11-02 (×9): qty 113

## 2014-11-02 MED ORDER — DIPHENHYDRAMINE-ZINC ACETATE 2-0.1 % EX CREA: TOPICAL_CREAM | Freq: Three times a day (TID) | CUTANEOUS | Status: DC | PRN

## 2014-11-02 MED ORDER — HYDROCORTISONE 1 % EX OINT
TOPICAL_OINTMENT | Freq: Two times a day (BID) | CUTANEOUS | Status: AC
  Administered 2014-11-02 – 2014-11-03 (×4): via TOPICAL
  Filled 2014-11-02: qty 28.35

## 2014-11-02 NOTE — Progress Notes (Signed)
Patient is alert and oriented.  Patient is pleasant and cooperative with staff and peers.  Patient has attended group sessions today. Patient denies SI/HI/ AVH at this time.  Patient can be intrusive at time but she is pleasant.  Patient is medication complaint at this time.  Patient does have High BP and is prescribed medication.  Will continue to monitor patient for safety Q58mins

## 2014-11-02 NOTE — Plan of Care (Signed)
Problem: Ineffective individual coping Goal: STG: Patient will remain free from self harm Outcome: Progressing Patient denies SI/HI.  Goal: STG: Patient will participate in after care plan Outcome: Progressing Patient is participatng in treatment care plan.

## 2014-11-02 NOTE — Progress Notes (Signed)
[facility] MD Progress Note  11/02/2014 1:16 PM [name] [name]  MRN:  [id number] Subjective:  Patient denied any emotional issues. She denied any visual or auditory hallucinations. She stated her mood is good and she was most interested in when she might be going home. In regards to physical complaints she states that she has a rash on her breasts and leg. She stated that it itches. This writer exam in the area with 2 nurses present. It did appear she has a darkened 8 cm diameter darkened area on her right thigh. She also has a dark in circular area around her right nipple. He denies any pain in these areas. We did contact the hospitalist, Dr. Posey Pronto to evaluate however hospital is communicated that that was not urgent and that we were to arrange outpatient follow-up. Per nursing the area on her leg was fair on admission.  Principal Problem: Schizoaffective disorder, bipolar type with good prognostic features Diagnosis:   Patient Active Problem List   Diagnosis Date Noted  . Schizoaffective disorder, bipolar type with good prognostic features [F25.0] 10/30/2014  . Urinary incontinence [R32] 10/30/2014  . GERD (gastroesophageal reflux disease) [K21.9] 10/30/2014  . OSA (obstructive sleep apnea) [G47.33] 10/30/2014  . Osteoarthrosis, unspecified whether generalized or localized, involving lower leg [M17.9] 10/30/2014  . COPD (chronic obstructive pulmonary disease) [J44.9] 10/30/2014  . Chronic diastolic CHF (congestive heart failure) [I50.32] 05/15/2014  . Obesity [E66.9]   . History of colon polyps [Z86.010] 03/09/2013  . Renal mass [N28.89] 06/26/2011  . 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 Hypertension [I10] 06/24/2011  . Diabetes mellitus [E11.9] 06/24/2011   Total Time spent with patient: 30 minutes   Past Medical History:  Past Medical History  Diagnosis Date  . DM2 (diabetes mellitus, type 2)   . Hypertension   . Hypercholesteremia   . Schizophrenia   . Osteoarthritis   .  Bursitis   . OSA (obstructive sleep apnea)     does not use machine  . Left ventricular outflow tract obstruction     a. echo 03/2014: EF 60-65%, hypernamic LV systolic fxn, mod LVH w/ LVOT gradient estimated at 68 mm Hg w/ valsalva, very small LV internal cavity size, mildly increased LV posterior wall thickness, mild Ao valve scl w/o stenosis, diastolic dysfunction, normal RVSP  . LVH (left ventricular hypertrophy)     a. echo suggests long standing uncontrolled htn. she will not do well when dehydrated, LV cavity obliteration  . Obesity   . CHF (congestive heart failure)   . Anxiety   . COPD (chronic obstructive pulmonary disease)   . Bronchitis   . CAD (coronary artery disease)   . Tremors of nervous system   . GERD (gastroesophageal reflux disease)   . Environmental allergies   . Arthritis   . Depression   . Chronic kidney disease     shadow on x-ray  . Muscle weakness   . Chronic cough   . Lower extremity edema   . Wheezing   . On supplemental oxygen therapy     AS NEEDED    Past Surgical History  Procedure Laterality Date  . Tonsillectomy    . Knee reconstruction, medial patellar femoral ligament    . Cholecystectomy    . Colonoscopy  04/2011    [facility] per patient incomplete  . Tonsillectomy    . Cataract extraction w/phaco Left 09/10/2014    Procedure: CATARACT EXTRACTION PHACO AND INTRAOCULAR LENS PLACEMENT (IOC);  Surgeon: Birder Robson, MD;  Location: [facility] ORS;  Service: Ophthalmology;  Laterality: Left;  [place]: 00:44 AP%: 22.8 CDE: 10.24 Fluid lot #[id number]  . Joint replacement      TKR  . Eye surgery    . Cataract extraction w/phaco Right 10/15/2014    Procedure: CATARACT EXTRACTION PHACO AND INTRAOCULAR LENS PLACEMENT (IOC);  Surgeon: Birder Robson, MD;  Location: [facility] ORS;  Service: Ophthalmology;  Laterality: Right;  [place]: 00:52 AP:40.1 CDE:11.83 LOT NIOE #[id number]   Family History:  Family History  Problem Relation Age of Onset  . Colon cancer Neg Hx    . Liver disease Neg Hx   . Heart attack Mother    Social History:  History  Alcohol Use  . Yes    Comment: occ.     History  Drug Use No    Social History   Social History  . Marital Status: Widowed    Spouse Name: N/A  . Number of Children: 2  . Years of Education: N/A   Social History Main Topics  . Smoking status: Former Research scientist (life sciences)  . Smokeless tobacco: Never Used  . Alcohol Use: Yes     Comment: occ.  . Drug Use: No  . Sexual Activity: Not Currently   Other Topics Concern  . None   Social History Narrative   Additional History:    Sleep: Fair  Appetite:  Good   Assessment:   Musculoskeletal: Strength & Muscle Tone: within normal limits Gait & Station: broad based Patient leans: N/A   Psychiatric Specialty Exam: Physical Exam  Review of Systems  HENT: Negative.   Eyes: Negative.   Respiratory: Negative.   Cardiovascular: Negative.   Gastrointestinal: Negative.   Genitourinary: Negative.   Musculoskeletal: Negative.   Skin: Negative.   Neurological: Negative.   Endo/Heme/Allergies: Negative.   Psychiatric/Behavioral: Negative.  Negative for depression, suicidal ideas, hallucinations, memory loss and substance abuse. The patient is not nervous/anxious and does not have insomnia.     Blood pressure 182/134, pulse 73, temperature 98 F (36.7 C), temperature source Oral, resp. rate 18, height 5\' 6"  (1.676 m), weight 273 lb (123.832 kg).Body mass index is 44.08 kg/(m^2).  General Appearance: Disheveled  Eye Contact::  Good  Speech:  Mildly increased volume, decreased rate, increased tone  Volume:  Increased  Mood:  Euphoric  Affect:  Congruent  Thought Process:  Circumstantial  Orientation:  Full (Time, Place, and Person)  Thought Content:  Paranoid Ideation  Suicidal Thoughts:  No  Homicidal Thoughts:  No  Memory:  Immediate;   Good Recent;   Good Remote;   Good  Judgement:  Impaired  Insight:  Lacking  Psychomotor Activity:  Increased   Concentration:  NA  Recall:  NA  Fund of Knowledge:Fair  Language: Good  Akathisia:  No  Handed:    AIMS (if indicated):     Assets:  Financial Resources/Insurance Housing Social Support  ADL's:  Intact  Cognition: WNL  Sleep:  Number of Hours: 4.75     Current Medications: Current Facility-Administered Medications  Medication Dose Route Frequency Provider Last Rate Last Dose  . acetaminophen (TYLENOL) tablet 650 mg  650 mg Oral Q6H PRN Gonzella Lex, MD   650 mg at 11/02/14 0645  . alum & mag hydroxide-simeth (MAALOX/MYLANTA) 200-200-20 MG/5ML suspension 30 mL  30 mL Oral Q4H PRN Gonzella Lex, MD      . aspirin EC tablet 81 mg  81 mg Oral Daily Hildred Priest, MD   81 mg at 11/02/14 3818  .  atorvastatin (LIPITOR) tablet 20 mg  20 mg Oral QHS Gonzella Lex, MD   20 mg at 11/01/14 2200  . benztropine (COGENTIN) tablet 0.5 mg  0.5 mg Oral BID Hildred Priest, MD   0.5 mg at 11/02/14 0932  . citalopram (CELEXA) tablet 10 mg  10 mg Oral QHS Gonzella Lex, MD   10 mg at 11/01/14 2201  . Difluprednate 0.05 % EMUL 1 drop  1 drop Ophthalmic Daily Hildred Priest, MD   1 drop at 11/02/14 0935  . docusate sodium (COLACE) capsule 200 mg  200 mg Oral BID Hildred Priest, MD   200 mg at 11/02/14 0933  . famotidine (PEPCID) tablet 20 mg  20 mg Oral BID Gonzella Lex, MD   20 mg at 11/02/14 1138  . fluPHENAZine (PROLIXIN) tablet 15 mg  15 mg Oral QHS Hildred Priest, MD   15 mg at 11/01/14 2200  . fluPHENAZine decanoate (PROLIXIN) injection 25 mg  25 mg Intramuscular Q14 Days Hildred Priest, MD      . furosemide (LASIX) tablet 40 mg  40 mg Oral Daily PRN Gonzella Lex, MD      . glimepiride (AMARYL) tablet 2 mg  2 mg Oral BID Gonzella Lex, MD   2 mg at 11/02/14 0933  . insulin aspart (novoLOG) injection 0-5 Units  0-5 Units Subcutaneous QHS Hildred Priest, MD   0 Units at 10/30/14 2105  . insulin aspart (novoLOG)  injection 0-9 Units  0-9 Units Subcutaneous TID WC Hildred Priest, MD   0 Units at 10/30/14 1711  . insulin glargine (LANTUS) injection 15 Units  15 Units Subcutaneous QHS Gonzella Lex, MD   15 Units at 11/01/14 2201  . linagliptin (TRADJENTA) tablet 5 mg  5 mg Oral Daily Gonzella Lex, MD   5 mg at 11/02/14 0932  . lisinopril (PRINIVIL,ZESTRIL) tablet 5 mg  5 mg Oral BH-q7a Hildred Priest, MD   5 mg at 11/02/14 0644  . LORazepam (ATIVAN) tablet 0.5 mg  0.5 mg Oral BID Hildred Priest, MD   0.5 mg at 11/02/14 0933  . LORazepam (ATIVAN) tablet 1 mg  1 mg Oral QHS Hildred Priest, MD   1 mg at 11/01/14 2200  . magnesium hydroxide (MILK OF MAGNESIA) suspension 30 mL  30 mL Oral Daily PRN Gonzella Lex, MD      . metoprolol succinate (TOPROL-XL) 24 hr tablet 50 mg  50 mg Oral Daily Hildred Priest, MD   50 mg at 11/02/14 1139  . mometasone-formoterol (DULERA) 100-5 MCG/ACT inhaler 2 puff  2 puff Inhalation BID Hildred Priest, MD   2 puff at 11/02/14 0807  . nepafenac (ILEVRO) 0.3 % ophthalmic suspension 1 drop  1 drop Right Eye Daily Hildred Priest, MD   1 drop at 11/02/14 0935  . ondansetron (ZOFRAN) injection 4 mg  4 mg Intravenous Once PRN Alvin Critchley, MD      . senna [facility]) tablet 17.2 mg  2 tablet Oral QHS Hildred Priest, MD   17.2 mg at 11/01/14 2159  . spironolactone (ALDACTONE) tablet 25 mg  25 mg Oral Daily Gonzella Lex, MD   25 mg at 11/02/14 6270    Lab Results:  Results for orders placed or performed during the hospital encounter of 10/29/14 (from the past 48 hour(s))  Glucose, capillary     Status: None   Collection Time: 10/31/14  4:37 PM  Result Value Ref Range   Glucose-Capillary 93 65 - 99 mg/dL  Glucose, capillary     Status: Abnormal   Collection Time: 10/31/14 10:10 PM  Result Value Ref Range   Glucose-Capillary 194 (H) 65 - 99 mg/dL  Glucose, capillary     Status: Abnormal    Collection Time: 11/01/14  7:03 AM  Result Value Ref Range   Glucose-Capillary 113 (H) 65 - 99 mg/dL   Comment 1 Notify RN   Glucose, capillary     Status: Abnormal   Collection Time: 11/01/14 12:18 PM  Result Value Ref Range   Glucose-Capillary 154 (H) 65 - 99 mg/dL  Glucose, capillary     Status: Abnormal   Collection Time: 11/01/14  4:24 PM  Result Value Ref Range   Glucose-Capillary 100 (H) 65 - 99 mg/dL   Comment 1 Notify RN   Glucose, capillary     Status: None   Collection Time: 11/01/14  9:06 PM  Result Value Ref Range   Glucose-Capillary 80 65 - 99 mg/dL   Comment 1 Notify RN   Glucose, capillary     Status: None   Collection Time: 11/02/14  6:39 AM  Result Value Ref Range   Glucose-Capillary 87 65 - 99 mg/dL  Glucose, capillary     Status: Abnormal   Collection Time: 11/02/14 11:43 AM  Result Value Ref Range   Glucose-Capillary 137 (H) 65 - 99 mg/dL    Physical Findings: AIMS: Facial and Oral Movements Muscles of Facial Expression: None, normal Lips and Perioral Area: None, normal Jaw: None, normal Tongue: None, normal,Extremity Movements Upper (arms, wrists, hands, fingers): None, normal Lower (legs, knees, ankles, toes): None, normal, Trunk Movements Neck, shoulders, hips: None, normal, Overall Severity Severity of abnormal movements (highest score from questions above): None, normal Incapacitation due to abnormal movements: None, normal Patient's awareness of abnormal movements (rate only patient's report): No Awareness, Dental Status Current problems with teeth and/or dentures?: Yes Does patient usually wear dentures?: No  CIWA:  CIWA-Ar Total: 0 COWS:     Treatment Plan Summary: Daily contact with patient to assess and evaluate symptoms and progress in treatment and Medication management   69 year old African-American female with history of schizoaffective disorder. The patient was brought into our emergency department under involuntary commitment due to  worsening psychosis and manic symptoms. The patient has been noncompliance with medication regimen..  Schizoaffective disorder: Continue treatment with Prolixin which has been increased from 10 mg to 15 mgqhs. Prolixin DEC 25 mg given on 9/8  Rash-we will prescribe hydrocortisone cream 1% twice a day to affected areas on right thigh and left nipple.  EPS: Patient will be reassessed started on benztropine 0.5 bid.  Dyslipidemia: Continue Lipitor 20 mg by mouth daily  Edema-congestive heart failure: Continue Lasix 40 mg daily. Continue spironolactone  Diabetes: Continue Amaryl, Lantus and tradjenta. We will order FSBS before meals and at bedtime. Will also order a supplement insulin. Hemoglobin A1c is about 8. This is likely due to noncompliance.  HTN: Continue lisinopril 5 mg and metoprolol   GERD continue Pepcid 20 mg by mouth daily  COPD: Continue daily inhaler. Hospital does not have aadvair therefore will start dulera  Precautions continue every 15 minute checks  Diet low sodium and carb controlled  Collateral information: see above. Kelly  (guardian/son) [phone].  Nyra Jabs (son) [phone] [place] [phone].    Discharge dispo: Once a stable this patient will return to the group home she was staying at as she has not been evicted.   Medical  Decision Making:  Established Problem, Stable/Improving (1)     Faith Rogue 11/02/2014, 1:16 PM

## 2014-11-02 NOTE — [facility] Group Notes (Signed)
[facility] LCSW Aftercare Discharge Planning Group Note   11/02/2014 9:41 AM  Participation Quality: Active   Mood/Affect:  Labile  Depression Rating:  Unable to answer   Anxiety Rating:  Unable to answer   Thoughts of Suicide:  No Will you contract for safety?   NA  Current AVH:  No  Plan for Discharge/Comments: Pt states she wants to go to [place] to stay with her son for 1 month. She states she wants help finding a lawyer because she wants to sue 3 people and change her guardian. She states someone in the ED told her they would change her guardian. It was explained that this Probation officer cannot change her guardian while she was in the hospital. She would not accept this answer and continued asking for a lawyer.    Transportation Means: [place] MSW, [place]

## 2014-11-02 NOTE — [facility] Group Notes (Signed)
[facility] Group Notes:  (Nursing/MHT/Case Management/Adjunct)  Date:  11/02/2014  Time:  12:27 PM  Type of Therapy:  Psychoeducational Skills  Participation Level:  Active  Participation Quality:  Intrusive and Monopolizing  Affect:  Blunted  Cognitive:  Disorganized  Insight:  Improving  Engagement in Group:  Monopolizing and Off Topic  Modes of Intervention:  Clarification  Summary of Progress/Problems:  [name] [name] 11/02/2014, 12:27 PM

## 2014-11-02 NOTE — [facility] Group Notes (Signed)
[facility] LCSW Group Therapy  11/02/2014 3:12 PM  Type of Therapy:  Group Therapy  Participation Level:  Minimal  Participation Quality:  Drowsy  Affect:  Flat  Cognitive:  Disorganized  Insight:  Limited  Engagement in Therapy:  Limited  Modes of Intervention:  Discussion, Education, Socialization and Support  Summary of Progress/Problems:Pt will identify unhealthy thoughts and how they impact their emotions and behavior. Pt will be encouraged to discuss these thoughts, emotions and behaviors with the group. [name] [name] attended group and stayed the entire time. However, she slept through most of group.    Corinne MSW, [place]  11/02/2014, 3:12 PM

## 2014-11-03 LAB — GLUCOSE, CAPILLARY
GLUCOSE-CAPILLARY: 98 mg/dL (ref 65–99)
Glucose-Capillary: 122 mg/dL — ABNORMAL HIGH (ref 65–99)
Glucose-Capillary: 81 mg/dL (ref 65–99)
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
## 2014-11-03 NOTE — [facility] Group Notes (Signed)
[place] LCSW Group Therapy  11/03/2014 2:44 PM  Type of Therapy: Group Therapy  Participation Level: Did Not Attend  Modes of Intervention: Discussion, Education, Socialization and Support  Summary of Progress/Problems: Feelings around Relapse. Group members discussed the meaning of relapse and shared personal stories of relapse, how it affected them and others, and how they perceived themselves during this time. Group members were encouraged to identify triggers, warning signs and coping skills used when facing the possibility of relapse. Social supports were discussed and explored in detail.   [place] MSW, [place]  11/03/2014, 2:44 PM

## 2014-11-03 NOTE — Plan of Care (Signed)
Problem: Ineffective individual coping Goal: LTG: Patient will report a decrease in negative feelings Outcome: Progressing Pt mood is labile, however she notes feeling better overall.  Goal: STG: Pt will be able to identify effective and ineffective STG: Pt will be able to identify effective and ineffective coping patterns  Outcome: Progressing Pt is pleasant with peers, med compliant, and willing to discuss issues with clinical staff. She reads her scriptures for comfort.  Goal: STG: Patient will remain free from self harm Outcome: Progressing No self harm.

## 2014-11-03 NOTE — Progress Notes (Signed)
[name] [name] is calm and pleasant today, however, patient can be instrusive and paranoid  at times.  Patient is a diabetic and is medication complaint. Patient has rash on her right upper thigh, hydrocortisone cream given for itching.   Patient has attended some groups today. Patient denies any SI/Hi/AVH at this time.  Patient concerned that her son has not been to see her in a few days.  Patient is able to contract for safety, will continue to monitor Q15 for safety.

## 2014-11-04 LAB — GLUCOSE, CAPILLARY
GLUCOSE-CAPILLARY: 86 mg/dL (ref 65–99)
GLUCOSE-CAPILLARY: 114 mg/dL — AB (ref 65–99)
Glucose-Capillary: 123 mg/dL — ABNORMAL HIGH (ref 65–99)
Glucose-Capillary: 127 mg/dL — ABNORMAL HIGH (ref 65–99)

## 2014-11-04 MED ORDER — CLONIDINE HCL 0.1 MG PO TABS: 0.1000 mg | ORAL_TABLET | Freq: Two times a day (BID) | ORAL | Status: DC | PRN

## 2014-11-04 MED ORDER — CLONIDINE HCL 0.1 MG PO TABS
0.2000 mg | ORAL_TABLET | ORAL | Status: AC
  Administered 2014-11-04: 0.2 mg via ORAL
  Filled 2014-11-04: qty 2

## 2014-11-04 MED ORDER — TRIAMCINOLONE ACETONIDE 0.5 % EX CREA
TOPICAL_CREAM | Freq: Two times a day (BID) | CUTANEOUS | Status: DC
  Administered 2014-11-04 – 2014-11-05 (×3): via TOPICAL
  Filled 2014-11-04: qty 15

## 2014-11-04 MED ORDER — LISINOPRIL 10 MG PO TABS
20.0000 mg | ORAL_TABLET | Freq: Every day | ORAL | Status: DC
  Administered 2014-11-05: 20 mg via ORAL
  Filled 2014-11-04 (×2): qty 2

## 2014-11-04 NOTE — Plan of Care (Signed)
Problem: Ineffective individual coping Goal: LTG: Patient will report a decrease in negative feelings Outcome: Progressing Pt reports that she feels "wonderful" today.

## 2014-11-04 NOTE — Progress Notes (Addendum)
[facility] MD Progress Note  11/04/2014 12:34 PM [name] [name]  MRN:  [id number] Subjective:  Patient continues to be intrusive, hyperverbal, still delusional towards the group home and to was her son.  Patient states that she does not want to return to the group home as she is afraid that the son of the owner will become aggressive towards her.  She denies any problems during the hospital. She denies SI, HI or having hallucinations. Denies physical complaints, denies side effects from medications. There is no complaints about energy, appetite sleep or concentration.    Per nusrsing: [name] [name] is calm and pleasant today, however, patient can be instrusive and paranoid at times. Patient is a diabetic and is medication complaint. Patient has rash on her right upper thigh, hydrocortisone cream given for itching. Patient has attended some groups today. Patient denies any SI/Hi/AVH at this time. Patient concerned that her son has not been to see her in a few days. Patient is able to contract for safety, will continue to monitor   Principal Problem: Schizoaffective disorder, bipolar type with good prognostic features Diagnosis:   Patient Active Problem List   Diagnosis Date Noted  . Schizoaffective disorder, bipolar type with good prognostic features [F25.0] 10/30/2014  . Urinary incontinence [R32] 10/30/2014  . GERD (gastroesophageal reflux disease) [K21.9] 10/30/2014  . OSA (obstructive sleep apnea) [G47.33] 10/30/2014  . Osteoarthrosis, unspecified whether generalized or localized, involving lower leg [M17.9] 10/30/2014  . COPD (chronic obstructive pulmonary disease) [J44.9] 10/30/2014  . Chronic diastolic CHF (congestive heart failure) [I50.32] 05/15/2014  . Obesity [E66.9]   . History of colon polyps [Z86.010] 03/09/2013  . Renal mass [N28.89] 06/26/2011  . 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 Hypertension [I10] 06/24/2011  . Diabetes mellitus [E11.9] 06/24/2011   Total Time spent  with patient: 30 minutes   Past Medical History:  Past Medical History  Diagnosis Date  . DM2 (diabetes mellitus, type 2)   . Hypertension   . Hypercholesteremia   . Schizophrenia   . Osteoarthritis   . Bursitis   . OSA (obstructive sleep apnea)     does not use machine  . Left ventricular outflow tract obstruction     a. echo 03/2014: EF 60-65%, hypernamic LV systolic fxn, mod LVH w/ LVOT gradient estimated at 68 mm Hg w/ valsalva, very small LV internal cavity size, mildly increased LV posterior wall thickness, mild Ao valve scl w/o stenosis, diastolic dysfunction, normal RVSP  . LVH (left ventricular hypertrophy)     a. echo suggests long standing uncontrolled htn. she will not do well when dehydrated, LV cavity obliteration  . Obesity   . CHF (congestive heart failure)   . Anxiety   . COPD (chronic obstructive pulmonary disease)   . Bronchitis   . CAD (coronary artery disease)   . Tremors of nervous system   . GERD (gastroesophageal reflux disease)   . Environmental allergies   . Arthritis   . Depression   . Chronic kidney disease     shadow on x-ray  . Muscle weakness   . Chronic cough   . Lower extremity edema   . Wheezing   . On supplemental oxygen therapy     AS NEEDED    Past Surgical History  Procedure Laterality Date  . Tonsillectomy    . Knee reconstruction, medial patellar femoral ligament    . Cholecystectomy    . Colonoscopy  04/2011    [facility] per patient incomplete  . Tonsillectomy    .  Cataract extraction w/phaco Left 09/10/2014    Procedure: CATARACT EXTRACTION PHACO AND INTRAOCULAR LENS PLACEMENT (IOC);  Surgeon: Birder Robson, MD;  Location: [facility] ORS;  Service: Ophthalmology;  Laterality: Left;  [place]: 00:44 AP%: 22.8 CDE: 10.24 Fluid lot #[id number]  . Joint replacement      TKR  . Eye surgery    . Cataract extraction w/phaco Right 10/15/2014    Procedure: CATARACT EXTRACTION PHACO AND INTRAOCULAR LENS PLACEMENT (IOC);  Surgeon: Birder Robson, MD;   Location: [facility] ORS;  Service: Ophthalmology;  Laterality: Right;  [place]: 00:52 AP:40.1 CDE:11.83 LOT TJQZ #[id number]   Family History:  Family History  Problem Relation Age of Onset  . Colon cancer Neg Hx   . Liver disease Neg Hx   . Heart attack Mother    Social History:  History  Alcohol Use  . Yes    Comment: occ.     History  Drug Use No    Social History   Social History  . Marital Status: Widowed    Spouse Name: N/A  . Number of Children: 2  . Years of Education: N/A   Social History Main Topics  . Smoking status: Former Research scientist (life sciences)  . Smokeless tobacco: Never Used  . Alcohol Use: Yes     Comment: occ.  . Drug Use: No  . Sexual Activity: Not Currently   Other Topics Concern  . None   Social History Narrative   Additional History:    Sleep: Fair  Appetite:  Good   Assessment:   Musculoskeletal: Strength & Muscle Tone: within normal limits Gait & Station: broad based Patient leans: N/A   Psychiatric Specialty Exam: Physical Exam   Review of Systems  Constitutional: Negative.   HENT: Negative.   Eyes: Negative.   Respiratory: Negative.   Cardiovascular: Negative.   Gastrointestinal: Negative.   Genitourinary: Negative.   Musculoskeletal: Negative.   Skin: Negative.   Neurological: Negative.   Endo/Heme/Allergies: Negative.   Psychiatric/Behavioral: Negative.  Negative for depression, suicidal ideas, hallucinations, memory loss and substance abuse. The patient is not nervous/anxious and does not have insomnia.     Blood pressure 156/95, pulse 70, temperature 98 F (36.7 C), temperature source Oral, resp. rate 20, height 5\' 6"  (1.676 m), weight 123.832 kg (273 lb), SpO2 96 %.Body mass index is 44.08 kg/(m^2).  General Appearance: Disheveled  Eye Contact::  Good  Speech:  Mildly increased volume, decreased rate, increased tone  Volume:  Increased  Mood:  Euphoric  Affect:  Congruent  Thought Process:  Circumstantial  Orientation:  Full (Time,  Place, and Person)  Thought Content:  Paranoid Ideation  Suicidal Thoughts:  No  Homicidal Thoughts:  No  Memory:  Immediate;   Good Recent;   Good Remote;   Good  Judgement:  Impaired  Insight:  Lacking  Psychomotor Activity:  Increased  Concentration:  NA  Recall:  NA  Fund of Knowledge:Fair  Language: Good  Akathisia:  No  Handed:    AIMS (if indicated):     Assets:  Financial Resources/Insurance Housing Social Support  ADL's:  Intact  Cognition: WNL  Sleep:  Number of Hours: 5     Current Medications: Current Facility-Administered Medications  Medication Dose Route Frequency Provider Last Rate Last Dose  . acetaminophen (TYLENOL) tablet 650 mg  650 mg Oral Q6H PRN Gonzella Lex, MD   650 mg at 11/04/14 0749  . alum & mag hydroxide-simeth (MAALOX/MYLANTA) 200-200-20 MG/5ML suspension 30 mL  30 mL Oral Q4H PRN  Gonzella Lex, MD      . aspirin EC tablet 81 mg  81 mg Oral Daily Hildred Priest, MD   81 mg at 11/04/14 0932  . atorvastatin (LIPITOR) tablet 20 mg  20 mg Oral QHS Gonzella Lex, MD   20 mg at 11/03/14 2128  . benztropine (COGENTIN) tablet 0.5 mg  0.5 mg Oral BID Hildred Priest, MD   0.5 mg at 11/04/14 0932  . citalopram (CELEXA) tablet 10 mg  10 mg Oral QHS Gonzella Lex, MD   10 mg at 11/03/14 2128  . Difluprednate 0.05 % EMUL 1 drop  1 drop Ophthalmic Daily Hildred Priest, MD   1 drop at 11/04/14 0932  . diphenhydrAMINE-zinc acetate (BENADRYL) 2-0.1 % cream   Topical TID PRN Dustin Flock, MD      . docusate sodium (COLACE) capsule 200 mg  200 mg Oral BID Hildred Priest, MD   200 mg at 11/04/14 0933  . famotidine (PEPCID) tablet 20 mg  20 mg Oral BID Gonzella Lex, MD   20 mg at 11/04/14 0933  . fluPHENAZine (PROLIXIN) tablet 15 mg  15 mg Oral QHS Hildred Priest, MD   15 mg at 11/03/14 2127  . fluPHENAZine decanoate (PROLIXIN) injection 25 mg  25 mg Intramuscular Q14 Days Hildred Priest, MD       . furosemide (LASIX) tablet 40 mg  40 mg Oral Daily PRN Gonzella Lex, MD      . glimepiride (AMARYL) tablet 2 mg  2 mg Oral BID Gonzella Lex, MD   2 mg at 11/04/14 0933  . hydrocerin (EUCERIN) cream   Topical BID Dustin Flock, MD      . insulin aspart (novoLOG) injection 0-5 Units  0-5 Units Subcutaneous QHS Hildred Priest, MD   0 Units at 10/30/14 2105  . insulin aspart (novoLOG) injection 0-9 Units  0-9 Units Subcutaneous TID WC Hildred Priest, MD   0 Units at 10/30/14 1711  . insulin glargine (LANTUS) injection 15 Units  15 Units Subcutaneous QHS Gonzella Lex, MD   15 Units at 11/03/14 2126  . linagliptin (TRADJENTA) tablet 5 mg  5 mg Oral Daily Gonzella Lex, MD   5 mg at 11/04/14 0956  . lisinopril (PRINIVIL,ZESTRIL) tablet 20 mg  20 mg Oral Daily Gladstone Lighter, MD      . LORazepam (ATIVAN) tablet 0.5 mg  0.5 mg Oral BID Hildred Priest, MD   0.5 mg at 11/04/14 0933  . LORazepam (ATIVAN) tablet 1 mg  1 mg Oral QHS Hildred Priest, MD   1 mg at 11/03/14 2128  . magnesium hydroxide (MILK OF MAGNESIA) suspension 30 mL  30 mL Oral Daily PRN Gonzella Lex, MD      . metoprolol succinate (TOPROL-XL) 24 hr tablet 50 mg  50 mg Oral Daily Hildred Priest, MD   50 mg at 11/04/14 0700  . mometasone-formoterol (DULERA) 100-5 MCG/ACT inhaler 2 puff  2 puff Inhalation BID Hildred Priest, MD   2 puff at 11/04/14 0745  . nepafenac (ILEVRO) 0.3 % ophthalmic suspension 1 drop  1 drop Right Eye Daily Hildred Priest, MD   1 drop at 11/04/14 0932  . ondansetron (ZOFRAN) injection 4 mg  4 mg Intravenous Once PRN Alvin Critchley, MD      . senna [facility]) tablet 17.2 mg  2 tablet Oral QHS Hildred Priest, MD   17.2 mg at 11/03/14 2127  . spironolactone (ALDACTONE) tablet 25 mg  25 mg Oral  Daily Gonzella Lex, MD   25 mg at 11/04/14 1003    Lab Results:  Results for orders placed or performed during the hospital  encounter of 10/29/14 (from the past 48 hour(s))  Glucose, capillary     Status: None   Collection Time: 11/02/14  5:05 PM  Result Value Ref Range   Glucose-Capillary 91 65 - 99 mg/dL  Glucose, capillary     Status: Abnormal   Collection Time: 11/02/14  8:31 PM  Result Value Ref Range   Glucose-Capillary 144 (H) 65 - 99 mg/dL  Glucose, capillary     Status: Abnormal   Collection Time: 11/03/14 12:16 PM  Result Value Ref Range   Glucose-Capillary 122 (H) 65 - 99 mg/dL  Glucose, capillary     Status: None   Collection Time: 11/03/14  4:22 PM  Result Value Ref Range   Glucose-Capillary 81 65 - 99 mg/dL  Glucose, capillary     Status: None   Collection Time: 11/03/14  8:26 PM  Result Value Ref Range   Glucose-Capillary 98 65 - 99 mg/dL  Glucose, capillary     Status: Abnormal   Collection Time: 11/04/14  6:44 AM  Result Value Ref Range   Glucose-Capillary 123 (H) 65 - 99 mg/dL  Glucose, capillary     Status: Abnormal   Collection Time: 11/04/14 12:10 PM  Result Value Ref Range   Glucose-Capillary 127 (H) 65 - 99 mg/dL    Physical Findings: AIMS: Facial and Oral Movements Muscles of Facial Expression: None, normal Lips and Perioral Area: None, normal Jaw: None, normal Tongue: None, normal,Extremity Movements Upper (arms, wrists, hands, fingers): None, normal Lower (legs, knees, ankles, toes): None, normal, Trunk Movements Neck, shoulders, hips: None, normal, Overall Severity Severity of abnormal movements (highest score from questions above): None, normal Incapacitation due to abnormal movements: None, normal Patient's awareness of abnormal movements (rate only patient's report): No Awareness, Dental Status Current problems with teeth and/or dentures?: Yes Does patient usually wear dentures?: No  CIWA:  CIWA-Ar Total: 0 COWS:     Treatment Plan Summary: Daily contact with patient to assess and evaluate symptoms and progress in treatment and Medication management    69 year old African-American female with history of schizoaffective disorder. The patient was brought into our emergency department under involuntary commitment due to worsening psychosis and manic symptoms. The patient has been noncompliance with medication regimen..  Schizoaffective disorder: Continue treatment with Prolixin which has been increased from 10 mg to 15 mgqhs. Prolixin DEC 25 mg given on 9/8. Improving  Rash-we will prescribe hydrocortisone cream 1% twice a day to affected areas on right thigh and left nipple.  EPS: Patient will be reassessed started on benztropine 0.5 bid.  Dyslipidemia: Continue Lipitor 20 mg by mouth daily  Edema-congestive heart failure: Continue Lasix 40 mg daily. Continue spironolactone  Diabetes: Continue Amaryl, Lantus and tradjenta. We will order FSBS before meals and at bedtime. Will also order a supplement insulin. Hemoglobin A1c is about 8. This is likely due to noncompliance.  HTN: Continue lisinopril 5 mg and metoprolol.  IM consulted today.  Will check VS tid.  GERD continue Pepcid 20 mg by mouth daily  COPD: Continue daily inhaler. Hospital does not have aadvair therefore will start dulera  Precautions continue every 15 minute checks  Diet low sodium and carb controlled  Collateral information: see above. Kelly  (guardian/son) [phone].  Nyra Jabs (son) [phone] [place] [phone].    Discharge dispo:  Once a stable this patient will return to the group home she was staying at as she has not been evicted. Poossible d/c in the next few days.   Medical Decision Making:  Established Problem, Stable/Improving (1)     Hildred Priest 11/04/2014, 12:34 PM

## 2014-11-04 NOTE — Progress Notes (Signed)
Patient is alert and oriented. Pt is pleasant and cooperative with staff and peers. Denies SI/HI/AVH at this time. Pt is medication compliant. Pt has high BP which has been managed with medications. Pt has diabetes. She has not required Insulin coverage today. Pt has an eczema patch under her left breast and thigh. Cortisone cream is given. Will continue to monitor with q15 minute safety checks.

## 2014-11-04 NOTE — Consult Note (Signed)
[facility] NAME: [name] [name]    MR#:  [id number]  DATE OF BIRTH:  15-Jun-1945  DATE OF ADMISSION:  10/29/2014  PRIMARY CARE PHYSICIAN: Alvester Chou, NP   REQUESTING/REFERRING PHYSICIAN: Dr. Jerilee Hoh  CHIEF COMPLAINT:  No chief complaint on file.   HISTORY OF PRESENT ILLNESS:  [name] [name]  is a 69 y.o. female with a known history of hypertension, diabetes, diastolic dysfunction, left ventricular outflow tract obstruction, COPD schizoaffective disorder presents to the hospital secondary to worsening maniac symptoms and also paranoid symptoms. She is admitted to behavioral medicine unit. Medical consult has been requested for elevated blood pressure. I have taken care of this patient previously on medical floor. She has trouble with hypertension, with medications sometimes she also has hypotension. Diuresis is not a good idea with her, because of her LV outflow obstruction. Denies any headache, chest pain at this time. Vision is at baseline. Oral medications helped to improve the blood pressure this morning. We'll closely monitor her at this time.  PAST MEDICAL HISTORY:   Past Medical History  Diagnosis Date  . DM2 (diabetes mellitus, type 2)   . Hypertension   . Hypercholesteremia   . Schizophrenia   . Osteoarthritis   . Bursitis   . OSA (obstructive sleep apnea)     does not use machine  . Left ventricular outflow tract obstruction     a. echo 03/2014: EF 60-65%, hypernamic LV systolic fxn, mod LVH w/ LVOT gradient estimated at 68 mm Hg w/ valsalva, very small LV internal cavity size, mildly increased LV posterior wall thickness, mild Ao valve scl w/o stenosis, diastolic dysfunction, normal RVSP  . LVH (left ventricular hypertrophy)     a. echo suggests long standing uncontrolled htn. she will not do well when dehydrated, LV cavity obliteration  . Obesity   . CHF (congestive heart failure)   . Anxiety   . COPD (chronic  obstructive pulmonary disease)   . Bronchitis   . CAD (coronary artery disease)   . Tremors of nervous system   . GERD (gastroesophageal reflux disease)   . Environmental allergies   . Arthritis   . Depression   . Chronic kidney disease     shadow on x-ray  . Muscle weakness   . Chronic cough   . Lower extremity edema   . Wheezing   . On supplemental oxygen therapy     AS NEEDED    PAST SURGICAL HISTOIRY:   Past Surgical History  Procedure Laterality Date  . Tonsillectomy    . Knee reconstruction, medial patellar femoral ligament    . Cholecystectomy    . Colonoscopy  04/2011    [facility] per patient incomplete  . Tonsillectomy    . Cataract extraction w/phaco Left 09/10/2014    Procedure: CATARACT EXTRACTION PHACO AND INTRAOCULAR LENS PLACEMENT (IOC);  Surgeon: Birder Robson, MD;  Location: [facility] ORS;  Service: Ophthalmology;  Laterality: Left;  [place]: 00:44 AP%: 22.8 CDE: 10.24 Fluid lot #[id number]  . Joint replacement      TKR  . Eye surgery    . Cataract extraction w/phaco Right 10/15/2014    Procedure: CATARACT EXTRACTION PHACO AND INTRAOCULAR LENS PLACEMENT (IOC);  Surgeon: Birder Robson, MD;  Location: [facility] ORS;  Service: Ophthalmology;  Laterality: Right;  [place]: 00:52 AP:40.1 CDE:11.83 LOT DDUK #[id number]    SOCIAL HISTORY:   Social History  Substance Use Topics  . Smoking status: Former Research scientist (life sciences)  . Smokeless tobacco:  Never Used  . Alcohol Use: Yes     Comment: occ.    FAMILY HISTORY:   Family History  Problem Relation Age of Onset  . Colon cancer Neg Hx   . Liver disease Neg Hx   . Heart attack Mother     DRUG ALLERGIES:   Allergies  Allergen Reactions  . Haldol [Haloperidol Decanoate] Swelling and Other (See Comments)    Tongue swells and blurred vision  . Metformin Diarrhea  . Raspberry Swelling    Other reaction(s): SWELLINGin lips    REVIEW OF SYSTEMS:   Review of Systems  Constitutional: Negative for fever, chills, weight loss and  malaise/fatigue.  HENT: Negative for ear discharge, ear pain, hearing loss, nosebleeds and tinnitus.   Eyes: Negative for blurred vision, double vision and photophobia.  Respiratory: Negative for cough, hemoptysis, shortness of breath and wheezing.   Cardiovascular: Negative for chest pain, palpitations, orthopnea and leg swelling.  Gastrointestinal: Negative for heartburn, nausea, vomiting, abdominal pain, diarrhea, constipation and melena.  Genitourinary: Negative for dysuria, urgency, frequency and hematuria.  Musculoskeletal: Positive for myalgias. Negative for back pain and neck pain.       Left shoulder pain  Skin: Positive for itching and rash.       Right thigh eczema rash.  Neurological: Negative for dizziness, tingling, tremors, sensory change, speech change, focal weakness and headaches.  Endo/Heme/Allergies: Does not bruise/bleed easily.  Psychiatric/Behavioral: Negative for depression.    MEDICATIONS AT HOME:   Prior to Admission medications   Medication Sig Start Date End Date Taking? Authorizing Provider  Difluprednate (DUREZOL) 0.05 % EMUL Apply 1 drop to eye daily. One drop to right eye daily   Yes Birder Robson, MD  nepafenac (ILEVRO) 0.3 % ophthalmic suspension Place 1 drop into the right eye daily.   Yes Birder Robson, MD  acetaminophen (TYLENOL) 325 MG tablet Take 650 mg by mouth every 4 (four) hours as needed for mild pain.     Historical Provider, MD  aspirin 81 MG tablet Take 81 mg by mouth daily.    Historical Provider, MD  atorvastatin (LIPITOR) 20 MG tablet Take 20 mg by mouth at bedtime.     Historical Provider, MD  bisacodyl (DULCOLAX) 10 MG suppository Place 10 mg rectally daily as needed for mild constipation or moderate constipation.    Historical Provider, MD  citalopram (CELEXA) 10 MG tablet Take 1 tablet by mouth at bedtime.  08/01/14   Historical Provider, MD  famotidine (PEPCID) 20 MG tablet Take 20 mg by mouth 2 (two) times daily.    Historical  Provider, MD  fluPHENAZine (PROLIXIN) 10 MG tablet Take 10 mg by mouth at bedtime.    Historical Provider, MD  fluPHENAZine decanoate (PROLIXIN) 25 MG/ML injection Inject 50 mg into the muscle every 21 ( twenty-one) days.     Historical Provider, MD  Fluticasone-Salmeterol (ADVAIR) 250-50 MCG/DOSE AEPB Inhale 1 puff into the lungs 2 (two) times daily.    Historical Provider, MD  furosemide (LASIX) 20 MG tablet Take 40 mg by mouth daily as needed.     Historical Provider, MD  glimepiride (AMARYL) 2 MG tablet Take 1 tablet by mouth 2 (two) times daily. 08/01/14   Historical Provider, MD  glipiZIDE (GLUCOTROL) 10 MG tablet Take 10 mg by mouth 2 (two) times daily.     Historical Provider, MD  hydrocortisone 2.5 % cream Apply 1 application topically 2 (two) times daily. Apply to left nipple and right thigh    Historical  Provider, MD  insulin glargine (LANTUS) 100 UNIT/ML injection Inject 0.3 mLs (30 Units total) into the skin at bedtime. Patient taking differently: Inject 15 Units into the skin at bedtime.  07/02/14   [name], MD  linagliptin (TRADJENTA) 5 MG TABS tablet Take 5 mg by mouth daily.    Historical Provider, MD  lisinopril (PRINIVIL,ZESTRIL) 5 MG tablet Take 5 mg by mouth every morning.     Historical Provider, MD  LORazepam (ATIVAN) 0.5 MG tablet Take 0.5 mg by mouth 3 (three) times daily.     Historical Provider, MD  magnesium hydroxide (MILK OF MAGNESIA) 400 MG/5ML suspension Take 30 mLs by mouth daily as needed for mild constipation.    Historical Provider, MD  metoprolol succinate (TOPROL-XL) 50 MG 24 hr tablet Take 1 tablet by mouth daily. 08/01/14   Historical Provider, MD  polyethylene glycol powder (GLYCOLAX/MIRALAX) powder Take 17 g by mouth daily as needed. 08/01/14   Historical Provider, MD  risperiDONE (RISPERDAL) 0.5 MG tablet Take 1 tablet by mouth every morning. Take 1 tablet in the morning and take 2 tablets in the evening. 08/01/14   Historical Provider, MD  senna-docusate  (SENOKOT-S) 8.6-50 MG per tablet Take 2 tablets by mouth daily as needed for mild constipation or moderate constipation. May use 1-2 times daily as needed    Historical Provider, MD  spironolactone (ALDACTONE) 25 MG tablet Take 1 tablet by mouth daily. 08/01/14   Historical Provider, MD  tiotropium (SPIRIVA) 18 MCG inhalation capsule Place 1 capsule (18 mcg total) into inhaler and inhale daily. 07/02/14   [name], MD      VITAL SIGNS:  Blood pressure 156/95, pulse 70, temperature 98 F (36.7 C), temperature source Oral, resp. rate 20, height 5\' 6"  (1.676 m), weight 123.832 kg (273 lb), SpO2 96 %.  PHYSICAL EXAMINATION:   Physical Exam  GENERAL:  69 y.o.-year-old patient sitting in the bed with no acute distress. dishelved appearing EYES: Pupils equal, round, reactive to light and accommodation. No scleral icterus. Extraocular muscles intact.  HEENT: Head atraumatic, normocephalic. Oropharynx and nasopharynx clear.  NECK:  Supple, no jugular venous distention. No thyroid enlargement, no tenderness.  LUNGS: Normal breath sounds bilaterally, no wheezing, rales,rhonchi or crepitation. No use of accessory muscles of respiration. Decreased bibasilar breath sounds CARDIOVASCULAR: S1, S2 normal. No murmurs, rubs, or gallops.  ABDOMEN: Soft, nontender, nondistended. Bowel sounds present. No organomegaly or mass.  EXTREMITIES: 1+ leg edema, No cyanosis, or clubbing.  NEUROLOGIC: Cranial nerves II through XII are intact. Muscle strength 5/5 in all extremities. Sensation intact. Gait not checked.  PSYCHIATRIC: The patient is alert and oriented x 3. Paranoid thoughts observed. SKIN: No obvious rash, lesion, or ulcer.   LABORATORY PANEL:   CBC  Recent Labs Lab 10/28/14 1708  WBC 12.7*  HGB 13.8  HCT 43.1  PLT 244   ------------------------------------------------------------------------------------------------------------------  Chemistries   Recent Labs Lab 10/28/14 1708  NA 137   K 3.7  CL 98*  CO2 29  GLUCOSE 119*  BUN 11  CREATININE 0.98  CALCIUM 9.2  AST 35  ALT 24  ALKPHOS 80  BILITOT 0.6   ------------------------------------------------------------------------------------------------------------------  Cardiac Enzymes No results for input(s): TROPONINI in the last 168 hours. ------------------------------------------------------------------------------------------------------------------  RADIOLOGY:  No results found.  EKG:   Orders placed or performed during the hospital encounter of 08/29/14  . EKG 12-Lead  . EKG 12-Lead    IMPRESSION AND PLAN:   [name] [name]  is a 69 y.o.  female with a known history of hypertension, diabetes, diastolic dysfunction, left ventricular outflow tract obstruction, COPD schizoaffective disorder presents to the hospital secondary to worsening maniac symptoms and also paranoid symptoms.   #1 Malignant hypertension-known history of elevated blood pressure, but have to be very careful in treating as patient tends to become hypotensive very easily. -Continue metoprolol. Also on Aldactone. -Lisinopril dose has been increased today. -We will add by mouth clonidine when necessary if the blood pressure is elevated. -Continue to monitor closely.  #2 schizoaffective disorder with maniac symptoms-management per psychiatry. Continues to be paranoid at times. On Prolixin, Celexa, Cogentin and Ativan  #3 COPD-stable without any wheezing. Continue inhalers  #4 chronic diastolic dysfunction, CHF-well compensated at this time. Continue Lasix as needed only.  #5 diabetes mellitus-HbA1c of 8.3. Noncompliant with medications at home. -Currently on Tradjenta, glimepiride and Lantus. Also on sliding scale insulin.  #6 eczema-on Eucerin cream. We will also add triamcinolone.  All the records are reviewed and case discussed with Consulting provider. Management plans discussed with the patient, family and they are in  agreement.  CODE STATUS: Full Code  TOTAL TIME TAKING CARE OF THIS PATIENT: 50  minutes.    Gladstone Lighter M.D on 11/04/2014 at 1:24 PM  Between 7am to 6pm - Pager - [phone]  After 6pm go to [email or web address] - password [email or web address] [facility]  Office  [phone]  CC: Primary care Physician: Alvester Chou, NP

## 2014-11-04 NOTE — [facility] Group Notes (Signed)
[facility] Group Notes:  (Nursing/MHT/Case Management/Adjunct)  Date:  11/04/2014  Time:  2:16 PM  Type of Therapy:  Psychoeducational Skills  Participation Level:  Left early   Celso Amy 11/04/2014, 2:16 PM

## 2014-11-04 NOTE — Progress Notes (Signed)
Recreation Therapy Notes  Date: 09.12.16 Time: 3:00 pm Location: Craft Room  Group Topic: Self-expression  Goal Area(s) Addresses:  Patient will identify one color per emotion listed on wheel. Patient will verbalize benefit of using art as a means of self-expression. Patient will verbalize one emotion experienced during session. Patient will be educated on other forms of self-expression.  Behavioral Response: Left early  Intervention: Emotion Wheel  Activity: Patients were given a worksheet with 7 different emotions and were instructed to pick a color for each emotion.  Education: LRT educated patient on different forms of self-expression.   Education Outcome: Patient left before LRT educated patient.  Clinical Observations/Feedback: Patient was having side conversations at the beginning of group. LRT redirected patient. Patient difficult to redirect. Patient started falling asleep after LRT handed out supplies for activity. Patient asked to leave group because she was not feel well and felt like she was going to throw up. LRT told patient she could go back to her room. Patient left group at approximately 3:15 pm. Patient did not return to group.  Leonette Monarch, LRT/CTRS 11/04/2014 4:57 PM

## 2014-11-04 NOTE — [facility] Group Notes (Signed)
[facility] LCSW Group Therapy  11/04/2014 5:12 PM  Type of Therapy:  Group Therapy  Participation Level:  Did Not Attend   Keene Breath, MSW, LCSWA 11/04/2014, 5:12 PM

## 2014-11-04 NOTE — [facility] Group Notes (Signed)
[facility] Group Notes:  (Nursing/MHT/Case Management/Adjunct)  Date:  11/04/2014  Time:  9:36 PM  Type of Therapy:  Group Therapy  Participation Level:  Minimal  Participation Quality:  Appropriate  Affect:  Appropriate  Cognitive:  Appropriate  Insight:  Appropriate  Engagement in Group:  Limited  Modes of Intervention:  Discussion  Summary of Progress/Problems:  Kandis Fantasia 11/04/2014, 9:36 PM

## 2014-11-04 NOTE — Plan of Care (Signed)
Problem: Ineffective individual coping Goal: STG: Pt will be able to identify effective and ineffective STG: Pt will be able to identify effective and ineffective coping patterns  Outcome: Progressing Pt is medication compliant. She is social with the other patients and staff.

## 2014-11-05 LAB — GLUCOSE, CAPILLARY
GLUCOSE-CAPILLARY: 139 mg/dL — AB (ref 65–99)
Glucose-Capillary: 91 mg/dL (ref 65–99)
Glucose-Capillary: 102 mg/dL — ABNORMAL HIGH (ref 65–99)
Glucose-Capillary: 61 mg/dL — ABNORMAL LOW (ref 65–99)

## 2014-11-05 MED ORDER — BENZTROPINE MESYLATE 0.5 MG PO TABS
0.5000 mg | ORAL_TABLET | Freq: Two times a day (BID) | ORAL | Status: DC
Start: 2014-11-05 — End: 2015-09-01

## 2014-11-05 MED ORDER — LISINOPRIL 20 MG PO TABS: 20.0000 mg | ORAL_TABLET | ORAL | Status: DC

## 2014-11-05 MED ORDER — FLUPHENAZINE DECANOATE 25 MG/ML IJ SOLN: 25.0000 mg | INTRAMUSCULAR | Status: DC

## 2014-11-05 MED ORDER — DOCUSATE SODIUM 100 MG PO CAPS: 200.0000 mg | ORAL_CAPSULE | Freq: Two times a day (BID) | ORAL | Status: DC

## 2014-11-05 MED ORDER — INSULIN GLARGINE 100 UNIT/ML ~~LOC~~ SOLN: 15.0000 [IU] | Freq: Every day | SUBCUTANEOUS | Status: DC

## 2014-11-05 MED ORDER — FLUPHENAZINE HCL 5 MG PO TABS: 15.0000 mg | ORAL_TABLET | Freq: Every day | ORAL | Status: DC

## 2014-11-05 NOTE — Progress Notes (Signed)
Observed in the Day Room interacting well with others, called to a private area to maintain confidentiality of [place]. Observed with Bible, easily frustrated, tangential type of loose association, threaten to sue "anything that moves - the group home, her son, [name], etc..." Good knowledge of her medications and complied except docusate and senokot.

## 2014-11-05 NOTE — Plan of Care (Signed)
Problem: Ineffective individual coping Goal: STG: Patient will remain free from self harm Outcome: Progressing Medications administered as ordered by the physician, patient refused Colace 200 mg and Senokot 2 tablets at bedtime, Tylenol 650 mg PRN given at 0112 for 5/10 neck pain, 15 minute checks maintained for safety, clinical and moral support provided, patient encouraged to continue to express feelings and demonstrate safe care. Patient remain free from harm, will continue to monitor.

## 2014-11-05 NOTE — Progress Notes (Signed)
Pt discharged home, went over medications with her and she confirmed she would follow up with ACT Team. Pt understands follow up appointment, denies, SI/HI/VH. Belongings returned to pt.

## 2014-11-05 NOTE — Discharge Summary (Addendum)
Physician Discharge Summary Note  Patient:  [name] [name] is an 69 y.o., female MRN:  [id number] DOB:  [birth year] Patient phone:  [phone] (home)  Patient address:   [place] [address],  Total Time spent with patient: 30 minutes  Date of Admission:  10/29/2014 Date of Discharge: 11/05/14  Reason for Admission:  psychosis  Principal Problem: Schizoaffective disorder, bipolar type with good prognostic features Discharge Diagnoses: Patient Active Problem List   Diagnosis Date Noted  . Schizoaffective disorder, bipolar type with good prognostic features [F25.0] 10/30/2014  . Urinary incontinence [R32] 10/30/2014  . GERD (gastroesophageal reflux disease) [K21.9] 10/30/2014  . OSA (obstructive sleep apnea) [G47.33] 10/30/2014  . Osteoarthrosis, unspecified whether generalized or localized, involving lower leg [M17.9] 10/30/2014  . COPD (chronic obstructive pulmonary disease) [J44.9] 10/30/2014  . Chronic diastolic CHF (congestive heart failure) [I50.32] 05/15/2014  . Obesity [E66.9]   . History of colon polyps [Z86.010] 03/09/2013  . Renal mass [N28.89] 06/26/2011  . 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 Hypertension [I10] 06/24/2011  . Diabetes mellitus [E11.9] 06/24/2011    Musculoskeletal: Strength & Muscle Tone: within normal limits Gait & Station: normal Patient leans: N/A  Psychiatric Specialty Exam: Physical Exam  Review of Systems  Constitutional: Negative.   HENT: Negative.   Eyes: Negative.   Respiratory: Negative.   Cardiovascular: Negative.   Gastrointestinal: Negative.   Genitourinary: Negative.   Musculoskeletal: Negative.   Skin: Negative.   Neurological: Negative.   Endo/Heme/Allergies: Negative.   Psychiatric/Behavioral: Negative.     Blood pressure 134/90, pulse 70, temperature 98 F (36.7 C), temperature source Oral, resp. rate 20, height '5\' 6"'  (1.676 m), weight 123.832 kg (273 lb), SpO2 96 %.Body mass index is 44.08  kg/(m^2).  General Appearance: Fairly Groomed  Engineer, water::  Good  Speech:  Normal Rate  Volume:  Normal  Mood:  Euthymic  Affect:  Congruent  Thought Process:  Logical  Orientation:  Full (Time, Place, and Person)  Thought Content:  Delusions  Suicidal Thoughts:  No  Homicidal Thoughts:  No  Memory:  Immediate;   Good Recent;   Good Remote;   Good  Judgement:  Poor  Insight:  Lacking  Psychomotor Activity:  Normal  Concentration:  NA  Recall:  NA  Fund of Knowledge:NA  Language: NA  Akathisia:  No  Handed:    AIMS (if indicated):     Assets:  Agricultural consultant Housing Social Support  ADL's:  Intact  Cognition: WNL  Sleep:  Number of Hours: 6.3   History of Present Illness: [name] [name] is a 69 y.o. female patient admitted with "I just wanted to know when he was going to give me my papers back".  Per ER: Reportedly she has been increasingly erratic with her behavior, noncompliant with her medications, running away from the group home, and calling EMS repeatedly.  Her son took out commitment paperwork stating that she is been agitated and disruptive and is not taking her medicine. The patient says that she has just been at her current group home for about 2 days after getting out of [facility] Since getting there she has had some trouble getting along with the owners. She says that one of them took some papers out of her Bible and was walking away with him. She says she went to get them back and somehow this escalated. Patient tends to minimize symptoms but it sounds like she's been making multiple phone  calls to 911 in going over to other people's houses. She also tells me that she had been humming some songs from time to time. It sounds like from what her son is saying that it's more than that and that she is actually singing loudly and disruptively.   Substance abuse history: Does not drink doesn't use any drugs of abuse  no history of alcohol or drug abuse. Used to smoke but gave up a few years ago.   HPI Elements: Quality: Agitation psychosis paranoia disruptive behavior. Severity: Severe enough to make it impossible to live outside the hospital. Timing: Sounds like it's a chronic problem which is probably escalated the last 2-3 days. Duration: Slightly improved just being in the hospital. Context: Questionable treatment compliance.  Total Time spent with patient: 1 hour   Past psychiatric history: Long-standing history of schizoaffective or schizophrenia disorder. Multiple hospitalizations. Recently discharge from [facility]. Poor insight poor compliance. Tends to get very paranoid and agitated and at times aggressive. I don't know of any history of suicide attempts but she has a history of being very disruptive in public and at times aggressive to other people. She will usually get paranoid and believed that people are selling drugs including her son and that she needs to call the police on them.  Past Medical History:  Past Medical History  Diagnosis Date  . DM2 (diabetes mellitus, type 2)   . Hypertension   . Hypercholesteremia   . Schizophrenia   . Osteoarthritis   . Bursitis   . OSA (obstructive sleep apnea)     does not use machine  . Left ventricular outflow tract obstruction     a. echo 03/2014: EF 60-65%, hypernamic LV systolic fxn, mod LVH w/ LVOT gradient estimated at 68 mm Hg w/ valsalva, very small LV internal cavity size, mildly increased LV posterior wall thickness, mild Ao valve scl w/o stenosis, diastolic dysfunction, normal RVSP  . LVH (left ventricular hypertrophy)     a. echo suggests long standing uncontrolled htn. she will not do well when dehydrated, LV cavity obliteration  . Obesity   . CHF (congestive heart failure)   . Anxiety   . COPD (chronic obstructive pulmonary disease)   . Bronchitis   . CAD (coronary artery  disease)   . Tremors of nervous system   . GERD (gastroesophageal reflux disease)   . Environmental allergies   . Arthritis   . Depression   . Chronic kidney disease     shadow on x-ray  . Muscle weakness   . Chronic cough   . Lower extremity edema   . Wheezing   . On supplemental oxygen therapy     AS NEEDED    Past Surgical History  Procedure Laterality Date  . Tonsillectomy    . Knee reconstruction, medial patellar femoral ligament    . Cholecystectomy    . Colonoscopy  04/2011    [facility] per patient incomplete  . Tonsillectomy    . Cataract extraction w/phaco Left 09/10/2014    Procedure: CATARACT EXTRACTION PHACO AND INTRAOCULAR LENS PLACEMENT (IOC); Surgeon: Birder Robson, MD; Location: [facility] ORS; Service: Ophthalmology; Laterality: Left; [place]: 00:44 AP%: 22.8 CDE: 10.24 Fluid lot #[id number]  . Joint replacement      TKR  . Eye surgery    . Cataract extraction w/phaco Right 10/15/2014    Procedure: CATARACT EXTRACTION PHACO AND INTRAOCULAR LENS PLACEMENT (IOC); Surgeon: Birder Robson, MD; Location: [facility] ORS; Service: Ophthalmology; Laterality: Right; [place]: 00:52 AP:40.1 CDE:11.83 LOT PACK #  [id number]   Family History: Patient states her mother died of a clarification. Both mother and father had hypertension. Her father died after he was burned. Her brother has hypertension and diabetes. Both of her children have hypertension.  Family History  Problem Relation Age of Onset  . Colon cancer Neg Hx   . Liver disease Neg Hx   . Heart attack Mother    Social History: Closest relative is her son who is now her legal guardian. Patient's paranoia often focuses on him. Son has tried to get her into a group home for long-term living but the patient stays noncompliant much of the time. Pt has 2 grown children. One son lives in [place] and her other son lives in [place] is her legal guardian. History  Alcohol Use  . Yes    Comment: occ.    History  Drug Use No    Social History   Social History  . Marital Status: Widowed    Spouse Name: N/A  . Number of Children: 2  . Years of Education: N/A   Social History Main Topics  . Smoking status: Former Research scientist (life sciences)  . Smokeless tobacco: Never Used  . Alcohol Use: Yes     Comment: occ.  . Drug Use: No  . Sexual Activity: Not Currently         Hospital Course:  69 year old African-American female with history of schizoaffective disorder. The patient was brought into our emergency department under involuntary commitment due to worsening psychosis and manic symptoms. The patient has been noncompliance with medication regimen..  Schizoaffective disorder: Continue treatment with Prolixin which has been increased from 10 mg to 15 mgqhs. Prolixin DEC 25 mg given on 9/8.  Next injection of Prolixin Decanoate 25 mg IM will be due on September 22 (q 14 weeks).  Consider increasing dose of Prolixin Decanoate and tapering off oral anti-psychotics.  Rash-we will prescribe hydrocortisone cream 1% twice a day to affected areas on right thigh and left nipple.  EPS: Patient  started on benztropine 0.5 bid.  Dyslipidemia: Continue Lipitor 20 mg by mouth daily  Edema-congestive heart failure: Continue Lasix 40 mg daily. Continue spironolactone  Diabetes: Continue Amaryl, Lantus and tradjenta. We will order FSBS before meals and at bedtime. Will also order a supplement insulin. Hemoglobin A1c is about 8. This is likely due to noncompliance as glucose was very well controlled here with her regimen.  HTN: Continue metoprolol 50 mg. Internal medicine saw patient yesterday due to hypertension and increase the lisinopril to 20 mg by mouth daily.  GERD continue Pepcid 20 mg by mouth daily  COPD: Continue daily inhaler   Diet low sodium and carb  controlled  Collateral information: see above. Kelly (guardian/son) [phone]. Nyra Jabs (son) [phone] [place] [phone].   On the day of the discharge the patient was calm, pleasant and cooperative. There was no evidence of aggression or agitation. The patient was not hyperverbal, or delusional. She was compliant with medications throughout her hospitalization. She received a Prolixin Decanoate on September 8. She comply with her treatment for diabetes and her glucose was well controlled during her hospital stay. The patient did not require seclusion, restraints or forced medications. There were no episodes of aggression, or agitation.    On discharge they contacted the patient's guardian and reviewed diagnosis medications plan of care and follow-up recommendations. Guardian was requesting to have the patient transferred at [facility] as she had been there  in the past.  He reported that she had improved significantly and that they had her medications "right".  It was explained to him that at this point in time patient is not longer meet involuntary commitment criteria. She is stable and there is no acuity in order to justify a referral to another facility. He voiced understanding.    Discharge Vitals:   Blood pressure 134/90, pulse 70, temperature 98 F (36.7 C), temperature source Oral, resp. rate 20, height '5\' 6"'  (1.676 m), weight 123.832 kg (273 lb), SpO2 96 %. Body mass index is 44.08 kg/(m^2).  Lab Results:    Results for DEAMBER, BUCKHALTER (MRN [id number]) as of 11/05/2014 11:22  Ref. Range 10/28/2014 17:08  Sodium Latest Ref Range: 135-145 mmol/L 137  Potassium Latest Ref Range: 3.5-5.1 mmol/L 3.7  Chloride Latest Ref Range: 101-111 mmol/L 98 (L)  CO2 Latest Ref Range: 22-32 mmol/L 29  BUN Latest Ref Range: 6-20 mg/dL 11  Creatinine Latest Ref Range: 0.44-1.00 mg/dL 0.98  Calcium Latest Ref Range: 8.9-10.3 mg/dL 9.2  EGFR (Non-African Amer.) Latest Ref  Range: >60 mL/min 58 (L)  EGFR (African American) Latest Ref Range: >60 mL/min >60  Glucose Latest Ref Range: 65-99 mg/dL 119 (H)  Anion gap Latest Ref Range: 5-15  10  Alkaline Phosphatase Latest Ref Range: 38-126 U/L 80  Albumin Latest Ref Range: 3.5-5.0 g/dL 4.1  AST Latest Ref Range: 15-41 U/L 35  ALT Latest Ref Range: 14-54 U/L 24  Total Protein Latest Ref Range: 6.5-8.1 g/dL 7.8  Total Bilirubin Latest Ref Range: 0.3-1.2 mg/dL 0.6  Cholesterol Latest Ref Range: 0-200 mg/dL 129  Triglycerides Latest Ref Range: <150 mg/dL 74  HDL Cholesterol Latest Ref Range: >40 mg/dL 60  LDL (calc) Latest Ref Range: 0-99 mg/dL 54  VLDL Latest Ref Range: 0-40 mg/dL 15  Total CHOL/HDL Ratio Latest Units: RATIO 2.2  WBC Latest Ref Range: 3.6-11.0 K/uL 12.7 (H)  RBC Latest Ref Range: 3.80-5.20 MIL/uL 4.93  Hemoglobin Latest Ref Range: 12.0-16.0 g/dL 13.8  HCT Latest Ref Range: 35.0-47.0 % 43.1  MCV Latest Ref Range: 80.0-100.0 fL 87.4  MCH Latest Ref Range: 26.0-34.0 pg 28.0  MCHC Latest Ref Range: 32.0-36.0 g/dL 32.0  RDW Latest Ref Range: 11.5-14.5 % 13.5  Platelets Latest Ref Range: 295-284 K/uL 132  Salicylate Lvl Latest Ref Range: 2.8-30.0 mg/dL <4.0  Acetaminophen Latest Ref Range: 10-30 ug/mL <10 (L)  Hemoglobin A1C Latest Ref Range: 4.0-6.0 % 8.3 (H)  TSH Latest Ref Range: 0.350-4.500 uIU/mL 3.029   Results for EVONNE, RINKS (MRN [id number]) as of 11/05/2014 11:22  Ref. Range 10/28/2014 17:08 10/30/2014 02:00  Alcohol, Ethyl (B) Latest Ref Range: <5 mg/dL <5   Amphetamines, Ur Screen Latest Ref Range: NONE DETECTED   NONE DETECTED  Barbiturates, Ur Screen Latest Ref Range: NONE DETECTED   NONE DETECTED  Benzodiazepine, Ur Scrn Latest Ref Range: NONE DETECTED   POSITIVE (A)  Cocaine Metabolite,Ur [place] Latest Ref Range: NONE DETECTED   NONE DETECTED  Methadone Scn, Ur Latest Ref Range: NONE DETECTED   NONE DETECTED  MDMA (Ecstasy)Ur Screen Latest Ref Range: NONE DETECTED   NONE DETECTED   Cannabinoid 50 Ng, Ur [place] Latest Ref Range: NONE DETECTED   NONE DETECTED  Opiate, Ur Screen Latest Ref Range: NONE DETECTED   NONE DETECTED  Phencyclidine (PCP) Ur S Latest Ref Range: NONE DETECTED   NONE DETECTED  Tricyclic, Ur Screen Latest Ref Range: NONE DETECTED   NONE DETECTED   Physical Findings: AIMS: Facial  and Oral Movements Muscles of Facial Expression: None, normal Lips and Perioral Area: None, normal Jaw: None, normal Tongue: None, normal,Extremity Movements Upper (arms, wrists, hands, fingers): None, normal Lower (legs, knees, ankles, toes): None, normal, Trunk Movements Neck, shoulders, hips: None, normal, Overall Severity Severity of abnormal movements (highest score from questions above): None, normal Incapacitation due to abnormal movements: None, normal Patient's awareness of abnormal movements (rate only patient's report): No Awareness, Dental Status Current problems with teeth and/or dentures?: Yes Does patient usually wear dentures?: No  CIWA:  CIWA-Ar Total: 0 COWS:      Discharge Instructions    Diet - low sodium heart healthy    Complete by:  As directed      Diet Carb Modified    Complete by:  As directed             Medication List    STOP taking these medications        acetaminophen 325 MG tablet  Commonly known as:  TYLENOL     bisacodyl 10 MG suppository  Commonly known as:  DULCOLAX     glipiZIDE 10 MG tablet  Commonly known as:  GLUCOTROL     magnesium hydroxide 400 MG/5ML suspension  Commonly known as:  MILK OF MAGNESIA     risperiDONE 0.5 MG tablet  Commonly known as:  RISPERDAL      TAKE these medications      Indication   aspirin 81 MG tablet  Take 81 mg by mouth daily.  Notes to Patient:  Cardiovascular health      atorvastatin 20 MG tablet  Commonly known as:  LIPITOR  Take 20 mg by mouth at bedtime.  Notes to Patient:  Cholesterol      benztropine 0.5 MG tablet  Commonly known as:  COGENTIN  Take 1 tablet (0.5  mg total) by mouth 2 (two) times daily.  Notes to Patient:  EPS prevention      citalopram 10 MG tablet  Commonly known as:  CELEXA  Take 1 tablet by mouth at bedtime.  Notes to Patient:  Depression and anxiety      docusate sodium 100 MG capsule  Commonly known as:  COLACE  Take 2 capsules (200 mg total) by mouth 2 (two) times daily.  Notes to Patient:  Constipation      DUREZOL 0.05 % Emul  Generic drug:  Difluprednate  Apply 1 drop to eye daily. One drop to right eye daily  Notes to Patient:  Cataracts   Indication:  right eye post cataract removal     famotidine 20 MG tablet  Commonly known as:  PEPCID  Take 20 mg by mouth 2 (two) times daily.  Notes to Patient:  GERD      fluPHENAZine 5 MG tablet  Commonly known as:  PROLIXIN  Take 3 tablets (15 mg total) by mouth at bedtime.  Notes to Patient:  Psychosis      fluPHENAZine decanoate 25 MG/ML injection  Commonly known as:  PROLIXIN  Inject 1 mL (25 mg total) into the muscle every 14 (fourteen) days.  Start taking on:  11/14/2014  Notes to Patient:  Psychosis      Fluticasone-Salmeterol 250-50 MCG/DOSE Aepb  Commonly known as:  ADVAIR  Inhale 1 puff into the lungs 2 (two) times daily.  Notes to Patient:  COPD      furosemide 20 MG tablet  Commonly known as:  LASIX  Take 40 mg by mouth daily as needed.  Notes to Patient:  Edema      glimepiride 2 MG tablet  Commonly known as:  AMARYL  Take 1 tablet by mouth 2 (two) times daily.  Notes to Patient:  Diabetes      hydrocortisone 2.5 % cream  Apply 1 application topically 2 (two) times daily. Apply to left nipple and right thigh  Notes to Patient:  Rash      ILEVRO 0.3 % ophthalmic suspension  Generic drug:  nepafenac  Place 1 drop into the right eye daily.  Notes to Patient:  Cataracts   Indication:  post cataract removal     insulin glargine 100 UNIT/ML injection  Commonly known as:  LANTUS  Inject 0.15 mLs (15 Units total) into the skin at bedtime.   Notes to Patient:  Diabetes      linagliptin 5 MG Tabs tablet  Commonly known as:  TRADJENTA  Take 5 mg by mouth daily.  Notes to Patient:  Diabetes      lisinopril 20 MG tablet  Commonly known as:  PRINIVIL,ZESTRIL  Take 1 tablet (20 mg total) by mouth every morning.      LORazepam 0.5 MG tablet  Commonly known as:  ATIVAN  Take 0.5 mg by mouth 3 (three) times daily.  Notes to Patient:  Anxiety and agitation      metoprolol succinate 50 MG 24 hr tablet  Commonly known as:  TOPROL-XL  Take 1 tablet by mouth daily.  Notes to Patient:  High blood pressure      polyethylene glycol powder powder  Commonly known as:  GLYCOLAX/MIRALAX  Take 17 g by mouth daily as needed.  Notes to Patient:  Constipation      senna-docusate 8.6-50 MG per tablet  Commonly known as:  Senokot-S  Take 2 tablets by mouth daily as needed for mild constipation or moderate constipation. May use 1-2 times daily as needed  Notes to Patient:  Constipation      spironolactone 25 MG tablet  Commonly known as:  ALDACTONE  Take 1 tablet by mouth daily.  Notes to Patient:  Edema      tiotropium 18 MCG inhalation capsule  Commonly known as:  SPIRIVA  Place 1 capsule (18 mcg total) into inhaler and inhale daily.  Notes to Patient:  COPD         Total Discharge Time: >30 minutes  Signed: Hildred Priest 11/05/2014, 11:20 AM

## 2014-11-05 NOTE — Progress Notes (Signed)
[place] [facility] [place] NAME: [name] [name]    MR#:  [id number]  DATE OF BIRTH:  Mar 09, 1945  SUBJECTIVE:  CHIEF COMPLAINT:  No chief complaint on file.  - patient seen for follow up of high blood pressure. BP better today - seems very happy, being discharged today  REVIEW OF SYSTEMS:  Review of Systems  Constitutional: Negative for fever and chills.  Respiratory: Negative for cough, shortness of breath and wheezing.   Cardiovascular: Negative for chest pain and palpitations.  Gastrointestinal: Negative for nausea, vomiting, abdominal pain, diarrhea and constipation.  Genitourinary: Negative for dysuria.  Musculoskeletal: Positive for neck pain.  Neurological: Negative for dizziness, seizures and headaches.    DRUG ALLERGIES:   Allergies  Allergen Reactions  . Haldol [Haloperidol Decanoate] Swelling and Other (See Comments)    Tongue swells and blurred vision  . Metformin Diarrhea  . Raspberry Swelling    Other reaction(s): SWELLINGin lips    VITALS:  Blood pressure 134/90, pulse 70, temperature 98 F (36.7 C), temperature source Oral, resp. rate 20, height 5\' 6"  (1.676 m), weight 123.832 kg (273 lb), SpO2 96 %.  PHYSICAL EXAMINATION:  Physical Exam  GENERAL:  69 y.o.-year-old patient lying in the bed with no acute distress.  EYES: Pupils equal, round, reactive to light and accommodation. No scleral icterus. Extraocular muscles intact.  HEENT: Head atraumatic, normocephalic. Oropharynx and nasopharynx clear.  NECK:  Supple, no jugular venous distention. No thyroid enlargement, no tenderness.  LUNGS: Normal breath sounds bilaterally, no wheezing, rales,rhonchi or crepitation. No use of accessory muscles of respiration.  CARDIOVASCULAR: S1, S2 normal. No murmurs, rubs, or gallops.  ABDOMEN: Soft, nontender, nondistended. Bowel sounds present. No organomegaly or mass.  EXTREMITIES: No pedal edema, cyanosis, or clubbing.   NEUROLOGIC: Cranial nerves II through XII are intact. Muscle strength 5/5 in all extremities. Sensation intact. Gait not checked.  PSYCHIATRIC: The patient is alert and oriented x 3.  SKIN: No obvious rash, lesion, or ulcer.   LABORATORY PANEL:   CBC No results for input(s): WBC, HGB, HCT, PLT in the last 168 hours. ------------------------------------------------------------------------------------------------------------------  Chemistries  No results for input(s): NA, K, CL, CO2, GLUCOSE, BUN, CREATININE, CALCIUM, MG, AST, ALT, ALKPHOS, BILITOT in the last 168 hours.  Invalid input(s): GFRCGP ------------------------------------------------------------------------------------------------------------------  Cardiac Enzymes No results for input(s): TROPONINI in the last 168 hours. ------------------------------------------------------------------------------------------------------------------  RADIOLOGY:  No results found.  EKG:   Orders placed or performed during the hospital encounter of 08/29/14  . EKG 12-Lead  . EKG 12-Lead    ASSESSMENT AND PLAN:   Terrah Decoster is a 69 y.o. female with a known history of hypertension, diabetes, diastolic dysfunction, left ventricular outflow tract obstruction, COPD schizoaffective disorder presents to the hospital secondary to worsening maniac symptoms and also paranoid symptoms.   #1 Malignant hypertension-known history of elevated blood pressure, but have to be very careful in treating as patient tends to become hypotensive very easily. -Continue metoprolol. Also on Aldactone. -Lisinopril dose has been increased. -Continue to monitor closely. - better controlled BP  #2 schizoaffective disorder with maniac symptoms-management per psychiatry. - On Prolixin, Celexa, Cogentin and Ativan  #3 COPD-stable without any wheezing. Continue inhalers  #4 chronic diastolic dysfunction, CHF-well compensated at this time. Continue Lasix as  needed only.  #5 diabetes mellitus-HbA1c of 8.3. Noncompliant with medications at home. -Currently on Tradjenta, glimepiride and Lantus. Also on sliding scale insulin.  #6 eczema-on Eucerin cream. also added triamcinolone.  Discharge  today, medically stable. Will sign off  All the records are reviewed and case discussed with Care Management/Social Workerr. Management plans discussed with the patient, family and they are in agreement.  CODE STATUS: Full code  TOTAL TIME TAKING CARE OF THIS PATIENT: 30 minutes.   POSSIBLE D/C TODAY, DEPENDING ON CLINICAL CONDITION.   Gladstone Lighter M.D on 11/05/2014 at 12:06 PM  Between 7am to 6pm - Pager - [phone]  After 6pm go to [email or web address] - password [email or web address] [facility]  [facility]  Office  [phone]  CC: Primary care physician; Alvester Chou, NP

## 2014-11-05 NOTE — Progress Notes (Signed)
Recreation Therapy Notes  INPATIENT RECREATION TR PLAN  Patient Details Name: [name] [name] MRN: [id number] DOB: 12-22-1945 Today's Date: 11/05/2014  Rec Therapy Plan Is patient appropriate for Therapeutic Recreation?: Yes Treatment times per week: At least once a week TR Treatment/Interventions: 1:1 session, Group participation (Comment) (Appropriate participation in daily recreation therapy tx)  Discharge Criteria Pt will be discharged from therapy if:: Discharged, Treatment goals are met Treatment plan/goals/alternatives discussed and agreed upon by:: Patient/family  Discharge Summary Short term goals set: See Care Plan Short term goals met: Complete Progress toward goals comments: One-to-one attended Which groups?: Goal setting, Leisure education, Coping skills, Self-esteem One-to-one attended: Communication Reason goals not met: N/A Therapeutic equipment acquired: None Reason patient discharged from therapy: Discharge from hospital Pt/family agrees with progress & goals achieved: Yes Date patient discharged from therapy: 11/05/14   Leonette Monarch, LRT/CTRS 11/05/2014, 5:04 PM

## 2014-11-05 NOTE — Plan of Care (Signed)
Problem: [facility] Participation in Recreation Therapeutic Interventions Goal: STG-Patient will demonstrate improved communication skills b STG: Communication - Within 3 treatment sessions, patient will participate in communication activity in one treatment session to increase healthy communication skills post d/c.  Outcome: Completed/Met Date Met:  11/05/14 Treatment Session 1; Completed 1 out of 1: At approximately 10:50 am, LRT met patient in craft room. Patient was instructed to take as much toilet paper as she was use in one hour. For each square, patient was instructed to give a fun fact. Patient able to give fun facts for each square. LRT educated patient on communication and trust. Patient reported treatment session was helpful.  Leonette Monarch, LRT/CTRS 09.13.16 2:22 pm

## 2014-11-05 NOTE — BHH Group Notes (Signed)
[facility] Group Notes:  (Nursing/MHT/Case Management/Adjunct)  Date:  11/05/2014  Time:  2:17 PM  Type of Therapy:  Psychoeducational Skills  Participation Level:  Active  Participation Quality:  Intrusive, Monopolizing and Redirectable  Affect:  Appropriate  Cognitive:  Disorganized  Insight:  Good and Improving  Engagement in Group:  Developing/Improving, Engaged and Monopolizing  Modes of Intervention:  Discussion and Education  Summary of Progress/Problems:  Drake Leach 11/05/2014, 2:17 PM

## 2014-11-05 NOTE — BHH Group Notes (Signed)
[place] LCSW Group Therapy  11/05/2014 2:57 PM  Type of Therapy:  Group Therapy  Participation Level:  Active  Participation Quality:  Attentive, Intrusive and Redirectable  Affect:  Blunted  Cognitive:  Alert and Disorganized  Insight:  Improving  Engagement in Therapy:  Improving  Modes of Intervention:  Socialization and Support  Summary of Progress/Problems: Patient was disorganized but much improvement. Patient was upset that group was starting 8 minutes late but was able to focus on group discussion and shared that she was excited because she will discharge today.   Keene Breath, MSW, LCSWA 11/05/2014, 2:57 PM

## 2014-11-05 NOTE — BHH Suicide Risk Assessment (Signed)
[facility] Discharge Suicide Risk Assessment   Demographic Factors:  Age 70 or older  Total Time spent with patient: 30 minutes   Psychiatric Specialty Exam: Physical Exam  ROS  Blood pressure 134/90, pulse 70, temperature 98 F (36.7 C), temperature source Oral, resp. rate 20, height 5\' 6"  (1.676 m), weight 123.832 kg (273 lb), SpO2 96 %.Body mass index is 44.08 kg/(m^2).   Has this patient used any form of tobacco in the last 30 days? (Cigarettes, Smokeless Tobacco, Cigars, and/or Pipes) No  Mental Status Per Nursing Assessment::   On Admission:     Current Mental Status by Physician: denies SI, HI.  No evidence of aggression or agitation.  Loss Factors: NA  Historical Factors: none  Risk Reduction Factors:   Religious beliefs about death, Living with another person, especially a relative and Positive social support  Continued Clinical Symptoms:  Schizophrenia:   Paranoid or undifferentiated type Previous Psychiatric Diagnoses and Treatments Medical Diagnoses and Treatments/Surgeries  Cognitive Features That Contribute To Risk:  Closed-mindedness    Suicide Risk:  Minimal: No identifiable suicidal ideation.  Patients presenting with no risk factors but with morbid ruminations; may be classified as minimal risk based on the severity of the depressive symptoms  Principal Problem: Schizoaffective disorder, bipolar type with good prognostic features Discharge Diagnoses:  Patient Active Problem List   Diagnosis Date Noted  . Schizoaffective disorder, bipolar type with good prognostic features [F25.0] 10/30/2014  . Urinary incontinence [R32] 10/30/2014  . GERD (gastroesophageal reflux disease) [K21.9] 10/30/2014  . OSA (obstructive sleep apnea) [G47.33] 10/30/2014  . Osteoarthrosis, unspecified whether generalized or localized, involving lower leg [M17.9] 10/30/2014  . COPD (chronic obstructive pulmonary disease) [J44.9] 10/30/2014  . Chronic diastolic CHF (congestive heart  failure) [I50.32] 05/15/2014  . Obesity [E66.9]   . History of colon polyps [Z86.010] 03/09/2013  . Renal mass [N28.89] 06/26/2011  . 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 Hypertension [I10] 06/24/2011  . Diabetes mellitus [E11.9] 06/24/2011      Is patient on multiple antipsychotic therapies at discharge:  No   Has Patient had three or more failed trials of antipsychotic monotherapy by history:  No  Recommended Plan for Multiple Antipsychotic Therapies: NA    Hildred Priest 11/05/2014, 11:31 AM

## 2014-11-05 NOTE — Progress Notes (Signed)
Recreation Therapy Notes  Date: 09.13.16 Time: 3:00 pm Location: Craft Room  Group Topic: Goal Setting  Goal Area(s) Addresses:  Patient will write at least one goal. Patient will write at least one obstacle.  Behavioral Response: Inattentive, Disruptive  Intervention: Recovery Goal Chart  Activity: Patients were instructed to write goals, obstacles, the date they started working on their goals, and the date they achieved their goals.  Education: LRT educated patients on healthy ways they can celebrate reaching their goals.   Education Outcome: In group clarification offered  Clinical Observations/Feedback: Patient did not participate in group activity. Patient left group at approximately 3:15 pm to use the restroom. Patient returned to group at approximately 3:24 pm. Patient asked how much longer group lasted. LRT told patient. Patient was focused on d/c. Patient was having side conversation during group discussion. LRT redirect patient multiple times. Patient difficult to redirect.  Leonette Monarch, LRT/CTRS 11/05/2014 4:47 PM

## 2014-11-29 ENCOUNTER — Emergency Department
Admission: EM | Admit: 2014-11-29 | Discharge: 2014-11-30 | Disposition: A | Discharge status: Home / Self Care | Payer: Medicare HMO | Attending: Emergency Medicine | Admitting: Emergency Medicine

## 2014-11-29 ENCOUNTER — Encounter: Payer: Self-pay | Admitting: Emergency Medicine

## 2014-11-29 DIAGNOSIS — G8929 Other chronic pain: Secondary | ICD-10-CM | POA: Insufficient documentation

## 2014-11-29 DIAGNOSIS — Z791 Long term (current) use of non-steroidal anti-inflammatories (NSAID): Secondary | ICD-10-CM | POA: Insufficient documentation

## 2014-11-29 DIAGNOSIS — M25512 Pain in left shoulder: Secondary | ICD-10-CM | POA: Diagnosis not present

## 2014-11-29 DIAGNOSIS — I129 Hypertensive chronic kidney disease with stage 1 through stage 4 chronic kidney disease, or unspecified chronic kidney disease: Secondary | ICD-10-CM | POA: Diagnosis not present

## 2014-11-29 DIAGNOSIS — Z7982 Long term (current) use of aspirin: Secondary | ICD-10-CM | POA: Insufficient documentation

## 2014-11-29 DIAGNOSIS — Z79899 Other long term (current) drug therapy: Secondary | ICD-10-CM | POA: Diagnosis not present

## 2014-11-29 DIAGNOSIS — N189 Chronic kidney disease, unspecified: Secondary | ICD-10-CM | POA: Diagnosis not present

## 2014-11-29 DIAGNOSIS — Z794 Long term (current) use of insulin: Secondary | ICD-10-CM | POA: Insufficient documentation

## 2014-11-29 DIAGNOSIS — R110 Nausea: Secondary | ICD-10-CM | POA: Diagnosis not present

## 2014-11-29 DIAGNOSIS — E119 Type 2 diabetes mellitus without complications: Secondary | ICD-10-CM | POA: Diagnosis not present

## 2014-11-29 DIAGNOSIS — Z87891 Personal history of nicotine dependence: Secondary | ICD-10-CM | POA: Insufficient documentation

## 2014-11-29 DIAGNOSIS — R1031 Right lower quadrant pain: Secondary | ICD-10-CM | POA: Diagnosis not present

## 2014-11-29 LAB — COMPREHENSIVE METABOLIC PANEL
ALBUMIN: 3.8 g/dL (ref 3.5–5.0)
ALT: 23 U/L (ref 14–54)
AST: 26 U/L (ref 15–41)
Alkaline Phosphatase: 86 U/L (ref 38–126)
Anion gap: 8 (ref 5–15)
BILIRUBIN TOTAL: 0.1 mg/dL — AB (ref 0.3–1.2)
BUN: 13 mg/dL (ref 6–20)
CO2: 33 mmol/L — ABNORMAL HIGH (ref 22–32)
Calcium: 9.5 mg/dL (ref 8.9–10.3)
Chloride: 99 mmol/L — ABNORMAL LOW (ref 101–111)
Creatinine, Ser: 1 mg/dL (ref 0.44–1.00)
GFR calc Af Amer: >60 mL/min (ref >60)
GFR calc non Af Amer: 56 mL/min — ABNORMAL LOW (ref >60)
GLUCOSE: 275 mg/dL — AB (ref 65–99)
POTASSIUM: 4 mmol/L (ref 3.5–5.1)
Sodium: 140 mmol/L (ref 135–145)
TOTAL PROTEIN: 7.4 g/dL (ref 6.5–8.1)

## 2014-11-29 LAB — CBC WITH DIFFERENTIAL/PLATELET
BASOS ABS: 0.1 10*3/uL (ref 0–0.1)
Basophils Relative: 1 %
EOS PCT: 2 %
Eosinophils Absolute: 0.3 10*3/uL (ref 0–0.7)
HEMATOCRIT: 42.2 % (ref 35.0–47.0)
Hemoglobin: 13.6 g/dL (ref 12.0–16.0)
LYMPHS PCT: 27 %
Lymphs Abs: 3.2 10*3/uL (ref 1.0–3.6)
MCH: 27.5 pg (ref 26.0–34.0)
MCHC: 32.3 g/dL (ref 32.0–36.0)
MCV: 85.3 fL (ref 80.0–100.0)
Monocytes Absolute: 1.3 10*3/uL — ABNORMAL HIGH (ref 0.2–0.9)
Monocytes Relative: 11 %
NEUTROS ABS: 7 10*3/uL — AB (ref 1.4–6.5)
Neutrophils Relative %: 59 %
PLATELETS: 245 10*3/uL (ref 150–440)
RBC: 4.94 MIL/uL (ref 3.80–5.20)
RDW: 13.8 % (ref 11.5–14.5)
WBC: 11.9 10*3/uL — AB (ref 3.6–11.0)

## 2014-11-29 LAB — TROPONIN I: Troponin I: <0.03 ng/mL (ref <0.031)

## 2014-11-29 LAB — LIPASE, BLOOD: Lipase: 21 U/L — ABNORMAL LOW (ref 22–51)

## 2014-11-29 MED ORDER — METOCLOPRAMIDE HCL 5 MG/ML IJ SOLN
10.0000 mg | Freq: Once | INTRAMUSCULAR | Status: AC
  Administered 2014-11-29: 10 mg via INTRAVENOUS
  Filled 2014-11-29: qty 2

## 2014-11-29 MED ORDER — IOHEXOL 240 MG/ML SOLN
25.0000 mL | Freq: Once | INTRAMUSCULAR | Status: AC | PRN
  Administered 2014-11-29: 25 mL via ORAL

## 2014-11-29 MED ORDER — SODIUM CHLORIDE 0.9 % IV BOLUS (SEPSIS)
1000.0000 mL | Freq: Once | INTRAVENOUS | Status: AC
  Administered 2014-11-29: 1000 mL via INTRAVENOUS

## 2014-11-29 NOTE — ED Notes (Signed)
Pt to ED via EMS from group home c/o abdominal pain around [place] starting tonight after eating peppers.  Denies n/v/d, nontender upon palpation.  Pt CBG 289 with EMS.  Pt A&Ox4, speaking in complete and coherent sentences and in NAD at this time.
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
## 2014-11-30 ENCOUNTER — Emergency Department: Payer: Medicare HMO

## 2014-11-30 DIAGNOSIS — R1031 Right lower quadrant pain: Secondary | ICD-10-CM | POA: Diagnosis not present

## 2014-11-30 LAB — URINALYSIS COMPLETE WITH MICROSCOPIC (ARMC ONLY)
Bilirubin Urine: NEGATIVE
KETONES UR: NEGATIVE mg/dL
NITRITE: NEGATIVE
Protein, ur: NEGATIVE mg/dL
SPECIFIC GRAVITY, URINE: 1.011 (ref 1.005–1.030)
pH: 7 (ref 5.0–8.0)

## 2014-11-30 MED ORDER — DICYCLOMINE HCL 20 MG PO TABS: 20.0000 mg | ORAL_TABLET | Freq: Three times a day (TID) | ORAL | Status: DC | PRN

## 2014-11-30 MED ORDER — IOHEXOL 300 MG/ML  SOLN
100.0000 mL | Freq: Once | INTRAMUSCULAR | Status: AC | PRN
  Administered 2014-11-30: 100 mL via INTRAVENOUS

## 2014-11-30 MED ORDER — METOCLOPRAMIDE HCL 10 MG PO TABS
10.0000 mg | ORAL_TABLET | Freq: Four times a day (QID) | ORAL | Status: DC | PRN
Start: 2014-11-30 — End: 2017-08-16

## 2014-11-30 NOTE — ED Notes (Signed)
Spoke with [name], pt's son at [phone].  Per son pt is from [place] at [phone].  Son said to call him back when pt is ready for discharge.

## 2014-11-30 NOTE — Discharge Instructions (Signed)

## 2014-12-01 LAB — URINE CULTURE

## 2014-12-05 ENCOUNTER — Emergency Department
Admission: EM | Admit: 2014-12-05 | Discharge: 2014-12-05 | Disposition: A | Discharge status: Home / Self Care | Payer: Medicare HMO | Attending: Emergency Medicine | Admitting: Emergency Medicine

## 2014-12-05 ENCOUNTER — Other Ambulatory Visit: Payer: Self-pay

## 2014-12-05 ENCOUNTER — Emergency Department: Payer: Medicare HMO

## 2014-12-05 DIAGNOSIS — R079 Chest pain, unspecified: Secondary | ICD-10-CM | POA: Insufficient documentation

## 2014-12-05 DIAGNOSIS — Z7982 Long term (current) use of aspirin: Secondary | ICD-10-CM | POA: Diagnosis not present

## 2014-12-05 DIAGNOSIS — I129 Hypertensive chronic kidney disease with stage 1 through stage 4 chronic kidney disease, or unspecified chronic kidney disease: Secondary | ICD-10-CM | POA: Insufficient documentation

## 2014-12-05 DIAGNOSIS — E119 Type 2 diabetes mellitus without complications: Secondary | ICD-10-CM | POA: Diagnosis not present

## 2014-12-05 DIAGNOSIS — R609 Edema, unspecified: Secondary | ICD-10-CM | POA: Diagnosis not present

## 2014-12-05 DIAGNOSIS — Z794 Long term (current) use of insulin: Secondary | ICD-10-CM | POA: Diagnosis not present

## 2014-12-05 DIAGNOSIS — Z87891 Personal history of nicotine dependence: Secondary | ICD-10-CM | POA: Diagnosis not present

## 2014-12-05 DIAGNOSIS — Z7951 Long term (current) use of inhaled steroids: Secondary | ICD-10-CM | POA: Insufficient documentation

## 2014-12-05 DIAGNOSIS — N189 Chronic kidney disease, unspecified: Secondary | ICD-10-CM | POA: Insufficient documentation

## 2014-12-05 DIAGNOSIS — J441 Chronic obstructive pulmonary disease with (acute) exacerbation: Secondary | ICD-10-CM | POA: Diagnosis not present

## 2014-12-05 DIAGNOSIS — F419 Anxiety disorder, unspecified: Secondary | ICD-10-CM | POA: Insufficient documentation

## 2014-12-05 DIAGNOSIS — Z79899 Other long term (current) drug therapy: Secondary | ICD-10-CM | POA: Insufficient documentation

## 2014-12-05 DIAGNOSIS — R0600 Dyspnea, unspecified: Secondary | ICD-10-CM

## 2014-12-05 LAB — CBC WITH DIFFERENTIAL/PLATELET
BASOS ABS: 0.1 10*3/uL (ref 0–0.1)
BASOS PCT: 1 %
EOS PCT: 1 %
Eosinophils Absolute: 0.1 10*3/uL (ref 0–0.7)
HEMATOCRIT: 42.9 % (ref 35.0–47.0)
Hemoglobin: 14 g/dL (ref 12.0–16.0)
Lymphocytes Relative: 27 %
Lymphs Abs: 2.4 10*3/uL (ref 1.0–3.6)
MCH: 27.8 pg (ref 26.0–34.0)
MCHC: 32.6 g/dL (ref 32.0–36.0)
MCV: 85.2 fL (ref 80.0–100.0)
MONO ABS: 0.8 10*3/uL (ref 0.2–0.9)
Monocytes Relative: 9 %
Neutro Abs: 5.5 10*3/uL (ref 1.4–6.5)
Neutrophils Relative %: 62 %
Platelets: 233 10*3/uL (ref 150–440)
RBC: 5.03 MIL/uL (ref 3.80–5.20)
RDW: 13.4 % (ref 11.5–14.5)
WBC: 9 10*3/uL (ref 3.6–11.0)

## 2014-12-05 LAB — COMPREHENSIVE METABOLIC PANEL
ALT: 22 U/L (ref 14–54)
AST: 27 U/L (ref 15–41)
Albumin: 3.4 g/dL — ABNORMAL LOW (ref 3.5–5.0)
Alkaline Phosphatase: 73 U/L (ref 38–126)
Anion gap: 7 (ref 5–15)
BILIRUBIN TOTAL: 0.4 mg/dL (ref 0.3–1.2)
BUN: 8 mg/dL (ref 6–20)
CO2: 31 mmol/L (ref 22–32)
CREATININE: 0.77 mg/dL (ref 0.44–1.00)
Calcium: 9 mg/dL (ref 8.9–10.3)
Chloride: 100 mmol/L — ABNORMAL LOW (ref 101–111)
Glucose, Bld: 232 mg/dL — ABNORMAL HIGH (ref 65–99)
POTASSIUM: 4 mmol/L (ref 3.5–5.1)
Sodium: 138 mmol/L (ref 135–145)
TOTAL PROTEIN: 7.2 g/dL (ref 6.5–8.1)

## 2014-12-05 LAB — LIPASE, BLOOD: Lipase: 13 U/L — ABNORMAL LOW (ref 22–51)

## 2014-12-05 LAB — TROPONIN I

## 2014-12-05 MED ORDER — LORAZEPAM 2 MG/ML IJ SOLN
1.0000 mg | Freq: Once | INTRAMUSCULAR | Status: AC
  Administered 2014-12-05: 1 mg via INTRAVENOUS
  Filled 2014-12-05: qty 1

## 2014-12-05 MED ORDER — SODIUM CHLORIDE 0.9 % IV BOLUS (SEPSIS)
500.0000 mL | Freq: Once | INTRAVENOUS | Status: AC
Start: 2014-12-05 — End: 2014-12-05
  Administered 2014-12-05: 500 mL via INTRAVENOUS

## 2014-12-05 MED ORDER — HYDRALAZINE HCL 20 MG/ML IJ SOLN
10.0000 mg | Freq: Once | INTRAMUSCULAR | Status: AC
  Administered 2014-12-05: 10 mg via INTRAVENOUS
  Filled 2014-12-05: qty 1

## 2014-12-05 NOTE — ED Notes (Signed)
[name] [name] states they do not have a pt by that name there. Looking at her facesheet to find out more information

## 2014-12-05 NOTE — ED Notes (Signed)
Spoke with [name] [name] who is the pts POA, he states he will be here to pick her up soon

## 2014-12-05 NOTE — ED Notes (Signed)
Pt states she is having difficulty breathing, does sound congested. Pt has been crying non stop since she has come to the ed. Pt states she doesn't want to go back to the home "because everyone is just so mean to me to" pt states she doesn't know why she is in a group home. No swelling to legs, CMS intact

## 2014-12-05 NOTE — Discharge Instructions (Signed)
You have been seen in the emergency department for chest pain and shortness of breath. Your workup has not shown any concerning findings today. Please follow-up with her primary care physician tomorrow, and her cardiologist as soon as possible regarding her chest pain and shortness of breath. Return to the emergency department for any worsening pain or worsening shortness of breath, or any other symptom personally concerning to your self.    Nonspecific Chest Pain  Chest pain can be caused by many different conditions. There is always a chance that your pain could be related to something serious, such as a heart attack or a blood clot in your lungs. Chest pain can also be caused by conditions that are not life-threatening. If you have chest pain, it is very important to follow up with your health care provider. CAUSES  Chest pain can be caused by:  Heartburn.  Pneumonia or bronchitis.  Anxiety or stress.  Inflammation around your heart (pericarditis) or lung (pleuritis or pleurisy).  A blood clot in your lung.  A collapsed lung (pneumothorax). It can develop suddenly on its own (spontaneous pneumothorax) or from trauma to the chest.  Shingles infection (varicella-zoster virus).  Heart attack.  Damage to the bones, muscles, and cartilage that make up your chest wall. This can include:  Bruised bones due to injury.  Strained muscles or cartilage due to frequent or repeated coughing or overwork.  Fracture to one or more ribs.  Sore cartilage due to inflammation (costochondritis). RISK FACTORS  Risk factors for chest pain may include:  Activities that increase your risk for trauma or injury to your chest.  Respiratory infections or conditions that cause frequent coughing.  Medical conditions or overeating that can cause heartburn.  Heart disease or family history of heart disease.  Conditions or health behaviors that increase your risk of developing a blood clot.  Having  had chicken pox (varicella zoster). SIGNS AND SYMPTOMS Chest pain can feel like:  Burning or tingling on the surface of your chest or deep in your chest.  Crushing, pressure, aching, or squeezing pain.  Dull or sharp pain that is worse when you move, cough, or take a deep breath.  Pain that is also felt in your back, neck, shoulder, or arm, or pain that spreads to any of these areas. Your chest pain may come and go, or it may stay constant. DIAGNOSIS Lab tests or other studies may be needed to find the cause of your pain. Your health care provider may have you take a test called an ambulatory ECG (electrocardiogram). An ECG records your heartbeat patterns at the time the test is performed. You may also have other tests, such as:  Transthoracic echocardiogram (TTE). During echocardiography, sound waves are used to create a picture of all of the heart structures and to look at how blood flows through your heart.  Transesophageal echocardiogram (TEE).This is a more advanced imaging test that obtains images from inside your body. It allows your health care provider to see your heart in finer detail.  Cardiac monitoring. This allows your health care provider to monitor your heart rate and rhythm in real time.  Holter monitor. This is a portable device that records your heartbeat and can help to diagnose abnormal heartbeats. It allows your health care provider to track your heart activity for several days, if needed.  Stress tests. These can be done through exercise or by taking medicine that makes your heart beat more quickly.  Blood tests.  Imaging tests.  TREATMENT  Your treatment depends on what is causing your chest pain. Treatment may include:  Medicines. These may include:  Acid blockers for heartburn.  Anti-inflammatory medicine.  Pain medicine for inflammatory conditions.  Antibiotic medicine, if an infection is present.  Medicines to dissolve blood clots.  Medicines to  treat coronary artery disease.  Supportive care for conditions that do not require medicines. This may include:  Resting.  Applying heat or cold packs to injured areas.  Limiting activities until pain decreases. HOME CARE INSTRUCTIONS  If you were prescribed an antibiotic medicine, finish it all even if you start to feel better.  Avoid any activities that bring on chest pain.  Do not use any tobacco products, including cigarettes, chewing tobacco, or electronic cigarettes. If you need help quitting, ask your health care provider.  Do not drink alcohol.  Take medicines only as directed by your health care provider.  Keep all follow-up visits as directed by your health care provider. This is important. This includes any further testing if your chest pain does not go away.  If heartburn is the cause for your chest pain, you may be told to keep your head raised (elevated) while sleeping. This reduces the chance that acid will go from your stomach into your esophagus.  Make lifestyle changes as directed by your health care provider. These may include:  Getting regular exercise. Ask your health care provider to suggest some activities that are safe for you.  Eating a heart-healthy diet. A registered dietitian can help you to learn healthy eating options.  Maintaining a healthy weight.  Managing diabetes, if necessary.  Reducing stress. SEEK MEDICAL CARE IF:  Your chest pain does not go away after treatment.  You have a rash with blisters on your chest.  You have a fever. SEEK IMMEDIATE MEDICAL CARE IF:   Your chest pain is worse.  You have an increasing cough, or you cough up blood.  You have severe abdominal pain.  You have severe weakness.  You faint.  You have chills.  You have sudden, unexplained chest discomfort.  You have sudden, unexplained discomfort in your arms, back, neck, or jaw.  You have shortness of breath at any time.  You suddenly start to  sweat, or your skin gets clammy.  You feel nauseous or you vomit.  You suddenly feel light-headed or dizzy.  Your heart begins to beat quickly, or it feels like it is skipping beats. These symptoms may represent a serious problem that is an emergency. Do not wait to see if the symptoms will go away. Get medical help right away. Call your local emergency services (911 in the [place] Do not drive yourself to the hospital.   This information is not intended to replace advice given to you by your health care provider. Make sure you discuss any questions you have with your health care provider.   Document Released: 11/18/2004 Document Revised: 03/01/2014 Document Reviewed: 09/14/2013 Elsevier Interactive Patient Education [employer].

## 2014-12-05 NOTE — ED Notes (Signed)
[facility] to see about sending pt home to them but no response will call back in 5 minutes

## 2014-12-05 NOTE — ED Provider Notes (Signed)
[facility] Emergency Department Provider Note  Time seen: 11:08 AM  I have reviewed the triage vital signs and the nursing notes.   HISTORY  Chief Complaint Chest Pain and Shortness of Breath    HPI [name] [name] is a 69 y.o. female with a past medical history of diabetes, hypertension, hyperlipidemia, schizophrenia, CHF, anxiety, CK D, presents the emergency department with shortness of breath and chest pain. According to the patient for the past 2-3 days she has been having intermittent left-sided chest pain as well as shortness of breath. Patient states her "heart is hurting." States she has a history of congestive heart failure which makes her heart hurt. Patient currently lives at a group home, presents via EMS. Vitals are within normal limits. No acute distress.Describes her chest pain is moderate currently.    Past Medical History  Diagnosis Date  . DM2 (diabetes mellitus, type 2) ([place])   . Hypertension   . Hypercholesteremia   . Schizophrenia ([place])   . Osteoarthritis   . Bursitis   . OSA (obstructive sleep apnea)     does not use machine  . Left ventricular outflow tract obstruction     a. echo 03/2014: EF 60-65%, hypernamic LV systolic fxn, mod LVH w/ LVOT gradient estimated at 68 mm Hg w/ valsalva, very small LV internal cavity size, mildly increased LV posterior wall thickness, mild Ao valve scl w/o stenosis, diastolic dysfunction, normal RVSP  . LVH (left ventricular hypertrophy)     a. echo suggests long standing uncontrolled htn. she will not do well when dehydrated, LV cavity obliteration  . Obesity   . CHF (congestive heart failure) ([place])   . Anxiety   . COPD (chronic obstructive pulmonary disease) ([place])   . Bronchitis   . CAD (coronary artery disease)   . Tremors of nervous system   . GERD (gastroesophageal reflux disease)   . Environmental allergies   . Arthritis   . Depression   . Chronic kidney disease     shadow on x-ray  .  Muscle weakness   . Chronic cough   . Lower extremity edema   . Wheezing   . On supplemental oxygen therapy     AS NEEDED    Patient Active Problem List   Diagnosis Date Noted  . Schizoaffective disorder, bipolar type with good prognostic features ([place]) 10/30/2014  . Urinary incontinence 10/30/2014  . GERD (gastroesophageal reflux disease) 10/30/2014  . OSA (obstructive sleep apnea) 10/30/2014  . Osteoarthrosis, unspecified whether generalized or localized, involving lower leg 10/30/2014  . COPD (chronic obstructive pulmonary disease) ([place]) 10/30/2014  . Chronic diastolic CHF (congestive heart failure) ([place]) 05/15/2014  . Obesity   . History of colon polyps 03/09/2013  . Renal mass 06/26/2011  . Lytic bone lesion of hip 06/25/2011  . Hypertension 06/24/2011  . Diabetes mellitus ([place]) 06/24/2011    Past Surgical History  Procedure Laterality Date  . Tonsillectomy    . Knee reconstruction, medial patellar femoral ligament    . Cholecystectomy    . Colonoscopy  04/2011    [facility] per patient incomplete  . Tonsillectomy    . Cataract extraction w/phaco Left 09/10/2014    Procedure: CATARACT EXTRACTION PHACO AND INTRAOCULAR LENS PLACEMENT (IOC);  Surgeon: Birder Robson, MD;  Location: [facility] ORS;  Service: Ophthalmology;  Laterality: Left;  [place]: 00:44 AP%: 22.8 CDE: 10.24 Fluid lot #[id number]  . Joint replacement      TKR  . Eye surgery    .  Cataract extraction w/phaco Right 10/15/2014    Procedure: CATARACT EXTRACTION PHACO AND INTRAOCULAR LENS PLACEMENT (IOC);  Surgeon: Birder Robson, MD;  Location: [facility] ORS;  Service: Ophthalmology;  Laterality: Right;  [place]: 00:52 AP:40.1 CDE:11.83 LOT EVOJ #[id number]    Current Outpatient Rx  Name  Route  Sig  Dispense  Refill  . aspirin 81 MG tablet   Oral   Take 81 mg by mouth daily.         Marland Kitchen atorvastatin (LIPITOR) 20 MG tablet   Oral   Take 20 mg by mouth at bedtime.          . benztropine (COGENTIN) 0.5 MG tablet   Oral    Take 1 tablet (0.5 mg total) by mouth 2 (two) times daily.   60 tablet   0   . citalopram (CELEXA) 10 MG tablet   Oral   Take 1 tablet by mouth at bedtime.          . dicyclomine (BENTYL) 20 MG tablet   Oral   Take 1 tablet (20 mg total) by mouth 3 (three) times daily as needed for spasms.   30 tablet   0   . Difluprednate (DUREZOL) 0.05 % EMUL   Ophthalmic   Apply 1 drop to eye daily. One drop to right eye daily         . docusate sodium (COLACE) 100 MG capsule   Oral   Take 2 capsules (200 mg total) by mouth 2 (two) times daily.   120 capsule   0   . famotidine (PEPCID) 20 MG tablet   Oral   Take 20 mg by mouth 2 (two) times daily.         . fluPHENAZine (PROLIXIN) 5 MG tablet   Oral   Take 3 tablets (15 mg total) by mouth at bedtime.   90 tablet   0   . fluPHENAZine decanoate (PROLIXIN) 25 MG/ML injection   Intramuscular   Inject 1 mL (25 mg total) into the muscle every 14 (fourteen) days.   5 mL   0     Due on 9/22   . furosemide (LASIX) 20 MG tablet   Oral   Take 40 mg by mouth daily as needed.          Marland Kitchen glimepiride (AMARYL) 2 MG tablet   Oral   Take 1 tablet by mouth 2 (two) times daily.         . insulin glargine (LANTUS) 100 UNIT/ML injection   Subcutaneous   Inject 0.15 mLs (15 Units total) into the skin at bedtime.   10 mL   2   . linagliptin (TRADJENTA) 5 MG TABS tablet   Oral   Take 5 mg by mouth daily.         Marland Kitchen lisinopril (PRINIVIL,ZESTRIL) 20 MG tablet   Oral   Take 1 tablet (20 mg total) by mouth every morning.   30 tablet   0   . LORazepam (ATIVAN) 0.5 MG tablet   Oral   Take 0.5 mg by mouth 3 (three) times daily.          . metoCLOPramide (REGLAN) 10 MG tablet   Oral   Take 1 tablet (10 mg total) by mouth every 6 (six) hours as needed for nausea or vomiting.   12 tablet   1   . metoprolol succinate (TOPROL-XL) 50 MG 24 hr tablet   Oral   Take 1 tablet by mouth daily.         Marland Kitchen  nepafenac (ILEVRO) 0.3 %  ophthalmic suspension   Right Eye   Place 1 drop into the right eye daily.         Marland Kitchen senna-docusate (SENOKOT-S) 8.6-50 MG per tablet   Oral   Take 2 tablets by mouth daily as needed for mild constipation or moderate constipation. May use 1-2 times daily as needed         . spironolactone (ALDACTONE) 25 MG tablet   Oral   Take 1 tablet by mouth daily.         Marland Kitchen tiotropium (SPIRIVA) 18 MCG inhalation capsule   Inhalation   Place 1 capsule (18 mcg total) into inhaler and inhale daily.   30 capsule   12     Allergies Haldol; Metformin; and Raspberry  Family History  Problem Relation Age of Onset  . Colon cancer Neg Hx   . Liver disease Neg Hx   . Heart attack Mother     Social History Social History  Substance Use Topics  . Smoking status: Former Research scientist (life sciences)  . Smokeless tobacco: Never Used  . Alcohol Use: No     Comment: occ.    Review of Systems Constitutional: Negative for fever. Cardiovascular: Positive for chest pain 3 days. Respiratory: Positive for dyspnea 3 days. Gastrointestinal: Negative for abdominal pain Musculoskeletal: Negative for back pain. Neurological: Negative for headache 10-point ROS otherwise negative.  ____________________________________________   PHYSICAL EXAM:  VITAL SIGNS: ED Triage Vitals  Enc Vitals Group     BP 12/05/14 1059 219/194 mmHg     Pulse Rate 12/05/14 1059 79     Resp --      Temp 12/05/14 1059 97.6 F (36.4 C)     Temp Source 12/05/14 1059 Oral     SpO2 12/05/14 1059 98 %     Weight 12/05/14 1059 272 lb 6.4 oz (123.56 kg)     Height --      Head Cir --      Peak Flow --      Pain Score --      Pain Loc --      Pain Edu? --      Excl. in [place]? --    Constitutional: Alert and oriented. Well appearing and in no distress. Eyes: Normal exam ENT   Head: Normocephalic and atraumatic   Mouth/Throat: Mucous membranes are moist. Cardiovascular: Normal rate, regular rhythm. No murmur Respiratory: Normal  respiratory effort without tachypnea nor retractions. Breath sounds are clear and equal bilaterally. No wheezes/rales/rhonchi. No chest tenderness. Gastrointestinal: Soft and nontender. No distention.  Musculoskeletal: Nontender with normal range of motion in all extremities. Mild lower extremity edema, equal bilaterally. No calf tenderness. Neurologic:  Normal speech and language. No gross focal neurologic deficits Psychiatric: Patient appears anxious, tearful at times during examination.   ____________________________________________    EKG  EKG shows what appears to be sinus rhythm (contrary to EKG reading which reads atrial fibrillation) at 81 bpm, narrow QRS, left axis deviation, intervals are largely within normal limits. Nonspecific ST changes are present, with inverted T waves laterally. T-wave inversions do not appear largely changed from 11/29/14.  ____________________________________________    RADIOLOGY  Chest x-ray shows no acute abnormality.  ____________________________________________   INITIAL IMPRESSION / ASSESSMENT AND PLAN / ED COURSE  Pertinent labs & imaging results that were available during my care of the patient were reviewed by me and considered in my medical decision making (see chart for details).  Patient presents for chest pain, and  dyspnea 3 days. We'll check labs, chest x-ray. EKG is read as atrial fibrillation but appears to be sinus rhythm including on the monitor. We will repeat an EKG for confirmation. Currently awaiting lab results, chest x-ray, and we will monitor closely in the emergency department on telemetry.  Chest x-ray shows no acute abnormality. Labs are largely within normal limits. Patient is hypertensive in the emergency department we will dose medications, monitor in the emergency department and recheck a troponin.  Repeat EKG 11:26:46 shows normal sinus rhythm at 80 bpm, narrow QRS, left axis deviation, normal intervals, nonspecific  ST changes.  ____________________________________________   FINAL CLINICAL IMPRESSION(S) / ED DIAGNOSES  Chest pain.   Harvest Dark, MD 12/06/14 [phone]

## 2014-12-05 NOTE — ED Notes (Signed)
Pt arrived via EMS for shortness of breath.   Worse on exertion.

## 2014-12-09 ENCOUNTER — Emergency Department: Payer: Medicare HMO

## 2014-12-09 ENCOUNTER — Encounter: Payer: Self-pay | Admitting: Emergency Medicine

## 2014-12-09 ENCOUNTER — Emergency Department
Admission: EM | Admit: 2014-12-09 | Discharge: 2014-12-09 | Disposition: A | Discharge status: Home / Self Care | Payer: Medicare HMO | Attending: Emergency Medicine | Admitting: Emergency Medicine

## 2014-12-09 DIAGNOSIS — I509 Heart failure, unspecified: Secondary | ICD-10-CM | POA: Insufficient documentation

## 2014-12-09 DIAGNOSIS — R079 Chest pain, unspecified: Secondary | ICD-10-CM | POA: Diagnosis present

## 2014-12-09 DIAGNOSIS — Z7951 Long term (current) use of inhaled steroids: Secondary | ICD-10-CM | POA: Diagnosis not present

## 2014-12-09 DIAGNOSIS — E119 Type 2 diabetes mellitus without complications: Secondary | ICD-10-CM | POA: Diagnosis not present

## 2014-12-09 DIAGNOSIS — Z7982 Long term (current) use of aspirin: Secondary | ICD-10-CM | POA: Diagnosis not present

## 2014-12-09 DIAGNOSIS — Z791 Long term (current) use of non-steroidal anti-inflammatories (NSAID): Secondary | ICD-10-CM | POA: Insufficient documentation

## 2014-12-09 DIAGNOSIS — Z79899 Other long term (current) drug therapy: Secondary | ICD-10-CM | POA: Diagnosis not present

## 2014-12-09 DIAGNOSIS — Z794 Long term (current) use of insulin: Secondary | ICD-10-CM | POA: Diagnosis not present

## 2014-12-09 DIAGNOSIS — N189 Chronic kidney disease, unspecified: Secondary | ICD-10-CM | POA: Diagnosis not present

## 2014-12-09 DIAGNOSIS — Z87891 Personal history of nicotine dependence: Secondary | ICD-10-CM | POA: Diagnosis not present

## 2014-12-09 DIAGNOSIS — I129 Hypertensive chronic kidney disease with stage 1 through stage 4 chronic kidney disease, or unspecified chronic kidney disease: Secondary | ICD-10-CM | POA: Diagnosis not present

## 2014-12-09 LAB — CBC
HCT: 44.2 % (ref 35.0–47.0)
HEMOGLOBIN: 14.2 g/dL (ref 12.0–16.0)
MCH: 27.4 pg (ref 26.0–34.0)
MCHC: 32.1 g/dL (ref 32.0–36.0)
MCV: 85.3 fL (ref 80.0–100.0)
Platelets: 267 10*3/uL (ref 150–440)
RBC: 5.18 MIL/uL (ref 3.80–5.20)
RDW: 13.9 % (ref 11.5–14.5)
WBC: 9.5 10*3/uL (ref 3.6–11.0)

## 2014-12-09 LAB — BASIC METABOLIC PANEL
ANION GAP: 8 (ref 5–15)
BUN: 11 mg/dL (ref 6–20)
CALCIUM: 9.3 mg/dL (ref 8.9–10.3)
CO2: 27 mmol/L (ref 22–32)
Chloride: 101 mmol/L (ref 101–111)
Creatinine, Ser: 0.95 mg/dL (ref 0.44–1.00)
GFR calc Af Amer: >60 mL/min (ref >60)
GFR, EST NON AFRICAN AMERICAN: 60 mL/min — AB (ref >60)
GLUCOSE: 304 mg/dL — AB (ref 65–99)
POTASSIUM: 3.8 mmol/L (ref 3.5–5.1)
Sodium: 136 mmol/L (ref 135–145)

## 2014-12-09 LAB — TROPONIN I: Troponin I: <0.03 ng/mL (ref <0.031)

## 2014-12-09 NOTE — ED Notes (Signed)
Called phone number listed as pts. number, [phone]. This is an old number.

## 2014-12-09 NOTE — ED Notes (Addendum)
Called [phone] for brinda. This is a number pt gave for the head person at group home. Left message.

## 2014-12-09 NOTE — ED Notes (Signed)
[name] [name] from group home here to pick up pt. neither one would sign for paperwork. [name] took papers.

## 2014-12-09 NOTE — Discharge Instructions (Signed)
You have been seen in the emergency department today for chest pain. As we discussed her workup including labs and chest x-ray are normal. Please follow-up with Dr.Golan as soon as possible by calling the number provided to arrange an appointment. Return to the emergency department for any further chest pain, trouble breathing, or any other symptom personally concerning to yourself.   Nonspecific Chest Pain It is often hard to find the cause of chest pain. There is always a chance that your pain could be related to something serious, such as a heart attack or a blood clot in your lungs. Chest pain can also be caused by conditions that are not life-threatening. If you have chest pain, it is very important to follow up with your doctor.  HOME CARE  If you were prescribed an antibiotic medicine, finish it all even if you start to feel better.  Avoid any activities that cause chest pain.  Do not use any tobacco products, including cigarettes, chewing tobacco, or electronic cigarettes. If you need help quitting, ask your doctor.  Do not drink alcohol.  Take medicines only as told by your doctor.  Keep all follow-up visits as told by your doctor. This is important. This includes any further testing if your chest pain does not go away.  Your doctor may tell you to keep your head raised (elevated) while you sleep.  Make lifestyle changes as told by your doctor. These may include:  Getting regular exercise. Ask your doctor to suggest some activities that are safe for you.  Eating a heart-healthy diet. Your doctor or a diet specialist (dietitian) can help you to learn healthy eating options.  Maintaining a healthy weight.  Managing diabetes, if necessary.  Reducing stress. GET HELP IF:  Your chest pain does not go away, even after treatment.  You have a rash with blisters on your chest.  You have a fever. GET HELP RIGHT AWAY IF:  Your chest pain is worse.  You have an increasing  cough, or you cough up blood.  You have severe belly (abdominal) pain.  You feel extremely weak.  You pass out (faint).  You have chills.  You have sudden, unexplained chest discomfort.  You have sudden, unexplained discomfort in your arms, back, neck, or jaw.  You have shortness of breath at any time.  You suddenly start to sweat, or your skin gets clammy.  You feel nauseous.  You vomit.  You suddenly feel light-headed or dizzy.  Your heart begins to beat quickly, or it feels like it is skipping beats. These symptoms may be an emergency. Do not wait to see if the symptoms will go away. Get medical help right away. Call your local emergency services (911 in the [place] Do not drive yourself to the hospital.   This information is not intended to replace advice given to you by your health care provider. Make sure you discuss any questions you have with your health care provider.   Document Released: 07/28/2007 Document Revised: 03/01/2014 Document Reviewed: 09/14/2013 Elsevier Interactive Patient Education [employer].

## 2014-12-09 NOTE — ED Notes (Signed)
Called [facility] again at [phone]. This is a wrong number.

## 2014-12-09 NOTE — ED Notes (Signed)
Called [facility] [phone] and left message

## 2014-12-09 NOTE — ED Notes (Signed)
Pt reports left sided chest pain started last night. Pt from group home and states noone would help her last night at the group  Home that she is from.  Pt was [place] [facility] parking lot after calling a code 250. Pt states she was sitting in the [place] from the group home, the lady driving the [place] had another resident over here at the hospital for medical reasons, pt sitting in [employer] [employer] stopped a person walking by to use her phone to let someone know that she was having chest pain.  Pt tearful in triage.

## 2014-12-09 NOTE — ED Provider Notes (Addendum)
[facility] Emergency Department Provider Note  Time seen: 3:09 PM  I have reviewed the triage vital signs and the nursing notes.   HISTORY  Chief Complaint Chest Pain    HPI [name] [name] is a 69 y.o. female with a past medical history of diabetes, hypertension, hyperlipidemia, CHF, anxiety, CK D, schizophrenia, presents the emergency department with chest pain. According to the patient for the past 3 days she has had left-sided chest pain. States it comes and goes, describes it as a gripping type chest pain. Denies any nausea, shortness breath, diaphoresis. Describes her chest pain is moderate, gone currently.     Past Medical History  Diagnosis Date  . DM2 (diabetes mellitus, type 2) ([place])   . Hypertension   . Hypercholesteremia   . Schizophrenia ([place])   . Osteoarthritis   . Bursitis   . OSA (obstructive sleep apnea)     does not use machine  . Left ventricular outflow tract obstruction     a. echo 03/2014: EF 60-65%, hypernamic LV systolic fxn, mod LVH w/ LVOT gradient estimated at 68 mm Hg w/ valsalva, very small LV internal cavity size, mildly increased LV posterior wall thickness, mild Ao valve scl w/o stenosis, diastolic dysfunction, normal RVSP  . LVH (left ventricular hypertrophy)     a. echo suggests long standing uncontrolled htn. she will not do well when dehydrated, LV cavity obliteration  . Obesity   . CHF (congestive heart failure) ([place])   . Anxiety   . COPD (chronic obstructive pulmonary disease) ([place])   . Bronchitis   . CAD (coronary artery disease)   . Tremors of nervous system   . GERD (gastroesophageal reflux disease)   . Environmental allergies   . Arthritis   . Depression   . Chronic kidney disease     shadow on x-ray  . Muscle weakness   . Chronic cough   . Lower extremity edema   . Wheezing   . On supplemental oxygen therapy     AS NEEDED    Patient Active Problem List   Diagnosis Date Noted  . Schizoaffective  disorder, bipolar type with good prognostic features ([place]) 10/30/2014  . Urinary incontinence 10/30/2014  . GERD (gastroesophageal reflux disease) 10/30/2014  . OSA (obstructive sleep apnea) 10/30/2014  . Osteoarthrosis, unspecified whether generalized or localized, involving lower leg 10/30/2014  . COPD (chronic obstructive pulmonary disease) ([place]) 10/30/2014  . Chronic diastolic CHF (congestive heart failure) ([place]) 05/15/2014  . Obesity   . History of colon polyps 03/09/2013  . Renal mass 06/26/2011  . Lytic bone lesion of hip 06/25/2011  . Hypertension 06/24/2011  . Diabetes mellitus ([place]) 06/24/2011    Past Surgical History  Procedure Laterality Date  . Tonsillectomy    . Knee reconstruction, medial patellar femoral ligament    . Cholecystectomy    . Colonoscopy  04/2011    [facility] per patient incomplete  . Tonsillectomy    . Cataract extraction w/phaco Left 09/10/2014    Procedure: CATARACT EXTRACTION PHACO AND INTRAOCULAR LENS PLACEMENT (IOC);  Surgeon: Birder Robson, MD;  Location: [facility] ORS;  Service: Ophthalmology;  Laterality: Left;  [place]: 00:44 AP%: 22.8 CDE: 10.24 Fluid lot #[id number]  . Joint replacement      TKR  . Eye surgery    . Cataract extraction w/phaco Right 10/15/2014    Procedure: CATARACT EXTRACTION PHACO AND INTRAOCULAR LENS PLACEMENT (IOC);  Surgeon: Birder Robson, MD;  Location: [facility] ORS;  Service: Ophthalmology;  Laterality: Right;  [place]: 00:52 AP:40.1 CDE:11.83 Norman Clay #[id number] H    Current Outpatient Rx  Name  Route  Sig  Dispense  Refill  . aspirin 81 MG tablet   Oral   Take 81 mg by mouth daily.         Marland Kitchen atorvastatin (LIPITOR) 20 MG tablet   Oral   Take 20 mg by mouth at bedtime.          . benztropine (COGENTIN) 0.5 MG tablet   Oral   Take 1 tablet (0.5 mg total) by mouth 2 (two) times daily.   60 tablet   0   . citalopram (CELEXA) 10 MG tablet   Oral   Take 1 tablet by mouth at bedtime.          . dicyclomine (BENTYL) 20 MG  tablet   Oral   Take 1 tablet (20 mg total) by mouth 3 (three) times daily as needed for spasms.   30 tablet   0   . Difluprednate (DUREZOL) 0.05 % EMUL   Ophthalmic   Apply 1 drop to eye daily. One drop to right eye daily         . docusate sodium (COLACE) 100 MG capsule   Oral   Take 2 capsules (200 mg total) by mouth 2 (two) times daily.   120 capsule   0   . famotidine (PEPCID) 20 MG tablet   Oral   Take 20 mg by mouth 2 (two) times daily.         . fluPHENAZine (PROLIXIN) 5 MG tablet   Oral   Take 3 tablets (15 mg total) by mouth at bedtime.   90 tablet   0   . fluPHENAZine decanoate (PROLIXIN) 25 MG/ML injection   Intramuscular   Inject 1 mL (25 mg total) into the muscle every 14 (fourteen) days.   5 mL   0     Due on 9/22   . furosemide (LASIX) 20 MG tablet   Oral   Take 40 mg by mouth daily as needed.          Marland Kitchen glimepiride (AMARYL) 2 MG tablet   Oral   Take 1 tablet by mouth 2 (two) times daily.         . insulin glargine (LANTUS) 100 UNIT/ML injection   Subcutaneous   Inject 0.15 mLs (15 Units total) into the skin at bedtime.   10 mL   2   . linagliptin (TRADJENTA) 5 MG TABS tablet   Oral   Take 5 mg by mouth daily.         Marland Kitchen lisinopril (PRINIVIL,ZESTRIL) 20 MG tablet   Oral   Take 1 tablet (20 mg total) by mouth every morning.   30 tablet   0   . LORazepam (ATIVAN) 0.5 MG tablet   Oral   Take 0.5 mg by mouth 3 (three) times daily.          . metoCLOPramide (REGLAN) 10 MG tablet   Oral   Take 1 tablet (10 mg total) by mouth every 6 (six) hours as needed for nausea or vomiting.   12 tablet   1   . metoprolol succinate (TOPROL-XL) 50 MG 24 hr tablet   Oral   Take 1 tablet by mouth daily.         . nepafenac (ILEVRO) 0.3 % ophthalmic suspension   Right Eye   Place 1 drop into the right eye daily.         Marland Kitchen  senna-docusate (SENOKOT-S) 8.6-50 MG per tablet   Oral   Take 2 tablets by mouth daily as needed for mild  constipation or moderate constipation. May use 1-2 times daily as needed         . spironolactone (ALDACTONE) 25 MG tablet   Oral   Take 1 tablet by mouth daily.         Marland Kitchen tiotropium (SPIRIVA) 18 MCG inhalation capsule   Inhalation   Place 1 capsule (18 mcg total) into inhaler and inhale daily.   30 capsule   12     Allergies Haldol; Metformin; and Raspberry  Family History  Problem Relation Age of Onset  . Colon cancer Neg Hx   . Liver disease Neg Hx   . Heart attack Mother     Social History Social History  Substance Use Topics  . Smoking status: Former Research scientist (life sciences)  . Smokeless tobacco: Never Used  . Alcohol Use: No     Comment: occ.    Review of Systems Constitutional: Negative for fever. Cardiovascular: Positive for chest pain Respiratory: Negative for shortness of breath. Gastrointestinal: Negative for abdominal pain, vomiting and diarrhea. Musculoskeletal: Negative for back pain. Neurological: Negative for headache 10-point ROS otherwise negative.  ____________________________________________   PHYSICAL EXAM:  VITAL SIGNS: ED Triage Vitals  Enc Vitals Group     BP 12/09/14 1037 165/130 mmHg     Pulse Rate 12/09/14 1037 110     Resp 12/09/14 1037 20     Temp 12/09/14 1037 98.3 F (36.8 C)     Temp Source 12/09/14 1037 Oral     SpO2 12/09/14 1037 97 %     Weight 12/09/14 1037 270 lb (122.471 kg)     Height 12/09/14 1037 5\' 6"  (1.676 m)     Head Cir --      Peak Flow --      Pain Score 12/09/14 1038 8     Pain Loc --      Pain Edu? --      Excl. in [place]? --     Constitutional: Alert and oriented. Tearful, asking for [place] to find her a new group home. Eyes: Normal exam ENT   Head: Normocephalic and atraumatic.   Mouth/Throat: Mucous membranes are moist. Cardiovascular: Normal rate, regular rhythm. No murmur Respiratory: Normal respiratory effort without tachypnea nor retractions. Breath sounds are clear and equal bilaterally. No  wheezes/rales/rhonchi. Mild chest tenderness to palpation. Gastrointestinal: Soft and nontender. No distention.   Musculoskeletal: Nontender with normal range of motion in all extremities. Mild lower tremor edema equal bilaterally. Nontender. Neurologic:  Normal speech and language. No gross focal neurologic deficits Skin:  Skin is warm, dry and intact.  Psychiatric: Mood and affect are normal. Speech and behavior are normal.  ____________________________________________    EKG  EKG reviewed and interpreted by myself shows sinus tachycardia 112 bpm, narrow QRS, normal axis, normal intervals, nonspecific ST changes present. No ST elevations noted.  ____________________________________________    RADIOLOGY  Chest x-ray shows no acute abnormality.  ____________________________________________    INITIAL IMPRESSION / ASSESSMENT AND PLAN / ED COURSE  Pertinent labs & imaging results that were available during my care of the patient were reviewed by me and considered in my medical decision making (see chart for details).  Labs are within normal limits including negative troponin. Chest x-ray within normal limits, EKG is unchanged.  Patient's main complaint is asking [place] to find a new group home for her saying she does not like  the group home because she does not get to talk on the phone long enough. Patient sees Dr. Candis Musa of cardiology, I discussed with the patient the need to follow-up with him this week regarding her chest pain and recent emergency department visits for the same, with negative troponins. Patient is agreeable and says she will call him. I discussed with the patient and we are unable to place her into a new group home from the emergency department, and I urged her to speak with her primary care physician and power of attorney to discuss this further.  Patient's ACT team member was in the emergency department, I discussed the patient with her. She states they are currently  looking for an alternate living facility for the patient, but they have not been able to find one yet. The patient has been given a 30 day notice that her current group home.  ACT team member states they gave her the notice because she is constantly calling [phone], police, and her family members, is noncooperative, and becomes agitated at times. ____________________________________________   FINAL CLINICAL IMPRESSION(S) / ED DIAGNOSES  Chest pain   Harvest Dark, MD 12/09/14 1516  Harvest Dark, MD 12/09/14 1806

## 2014-12-21 ENCOUNTER — Emergency Department: Payer: Medicare HMO

## 2014-12-21 ENCOUNTER — Emergency Department
Admission: EM | Admit: 2014-12-21 | Discharge: 2014-12-21 | Disposition: A | Discharge status: Home / Self Care | Payer: Medicare HMO | Attending: Emergency Medicine | Admitting: Emergency Medicine

## 2014-12-21 DIAGNOSIS — I129 Hypertensive chronic kidney disease with stage 1 through stage 4 chronic kidney disease, or unspecified chronic kidney disease: Secondary | ICD-10-CM | POA: Diagnosis not present

## 2014-12-21 DIAGNOSIS — Z7982 Long term (current) use of aspirin: Secondary | ICD-10-CM | POA: Insufficient documentation

## 2014-12-21 DIAGNOSIS — S59912A Unspecified injury of left forearm, initial encounter: Secondary | ICD-10-CM | POA: Insufficient documentation

## 2014-12-21 DIAGNOSIS — Y998 Other external cause status: Secondary | ICD-10-CM | POA: Insufficient documentation

## 2014-12-21 DIAGNOSIS — Z794 Long term (current) use of insulin: Secondary | ICD-10-CM | POA: Diagnosis not present

## 2014-12-21 DIAGNOSIS — S0083XA Contusion of other part of head, initial encounter: Secondary | ICD-10-CM | POA: Insufficient documentation

## 2014-12-21 DIAGNOSIS — N189 Chronic kidney disease, unspecified: Secondary | ICD-10-CM | POA: Insufficient documentation

## 2014-12-21 DIAGNOSIS — W06XXXA Fall from bed, initial encounter: Secondary | ICD-10-CM | POA: Insufficient documentation

## 2014-12-21 DIAGNOSIS — Z791 Long term (current) use of non-steroidal anti-inflammatories (NSAID): Secondary | ICD-10-CM | POA: Insufficient documentation

## 2014-12-21 DIAGNOSIS — Y9289 Other specified places as the place of occurrence of the external cause: Secondary | ICD-10-CM | POA: Diagnosis not present

## 2014-12-21 DIAGNOSIS — Z79899 Other long term (current) drug therapy: Secondary | ICD-10-CM | POA: Diagnosis not present

## 2014-12-21 DIAGNOSIS — Z87891 Personal history of nicotine dependence: Secondary | ICD-10-CM | POA: Diagnosis not present

## 2014-12-21 DIAGNOSIS — E119 Type 2 diabetes mellitus without complications: Secondary | ICD-10-CM | POA: Diagnosis not present

## 2014-12-21 DIAGNOSIS — T148XXA Other injury of unspecified body region, initial encounter: Secondary | ICD-10-CM

## 2014-12-21 DIAGNOSIS — S0990XA Unspecified injury of head, initial encounter: Secondary | ICD-10-CM | POA: Diagnosis present

## 2014-12-21 DIAGNOSIS — S59911A Unspecified injury of right forearm, initial encounter: Secondary | ICD-10-CM | POA: Insufficient documentation

## 2014-12-21 DIAGNOSIS — Y9389 Activity, other specified: Secondary | ICD-10-CM | POA: Insufficient documentation

## 2014-12-21 DIAGNOSIS — W19XXXA Unspecified fall, initial encounter: Secondary | ICD-10-CM

## 2014-12-21 MED ORDER — ACETAMINOPHEN 325 MG PO TABS
650.0000 mg | ORAL_TABLET | Freq: Once | ORAL | Status: AC
  Administered 2014-12-21: 650 mg via ORAL
  Filled 2014-12-21: qty 2

## 2014-12-21 NOTE — ED Provider Notes (Signed)
[facility] Emergency Department Provider Note    ____________________________________________  Time seen: On EMS arrival  I have reviewed the triage vital signs and the nursing notes.   HISTORY  Chief Complaint Fall and Arm Pain   History limited by: Not Limited   HPI [name] [name] is a 69 y.o. female who presents to the emergency department today because of concern for fall. The patient was found at the side of her bed. The patient states she fell out of her bed. She did try to catch herself. She is complaining of pain in bilateral forearms. She did suffer contusion reported. She is complaining of mild headache. She states she is on aspirin but no other blood thinners. No loss of consciousness. Patient denied any associated chest pain or palpitations.     Past Medical History  Diagnosis Date  . DM2 (diabetes mellitus, type 2) ([place])   . Hypertension   . Hypercholesteremia   . Schizophrenia ([place])   . Osteoarthritis   . Bursitis   . OSA (obstructive sleep apnea)     does not use machine  . Left ventricular outflow tract obstruction     a. echo 03/2014: EF 60-65%, hypernamic LV systolic fxn, mod LVH w/ LVOT gradient estimated at 68 mm Hg w/ valsalva, very small LV internal cavity size, mildly increased LV posterior wall thickness, mild Ao valve scl w/o stenosis, diastolic dysfunction, normal RVSP  . LVH (left ventricular hypertrophy)     a. echo suggests long standing uncontrolled htn. she will not do well when dehydrated, LV cavity obliteration  . Obesity   . CHF (congestive heart failure) ([place])   . Anxiety   . COPD (chronic obstructive pulmonary disease) ([place])   . Bronchitis   . CAD (coronary artery disease)   . Tremors of nervous system   . GERD (gastroesophageal reflux disease)   . Environmental allergies   . Arthritis   . Depression   . Chronic kidney disease     shadow on x-ray  . Muscle weakness   . Chronic cough   . Lower extremity  edema   . Wheezing   . On supplemental oxygen therapy     AS NEEDED    Patient Active Problem List   Diagnosis Date Noted  . Schizoaffective disorder, bipolar type with good prognostic features ([place]) 10/30/2014  . Urinary incontinence 10/30/2014  . GERD (gastroesophageal reflux disease) 10/30/2014  . OSA (obstructive sleep apnea) 10/30/2014  . Osteoarthrosis, unspecified whether generalized or localized, involving lower leg 10/30/2014  . COPD (chronic obstructive pulmonary disease) ([place]) 10/30/2014  . Chronic diastolic CHF (congestive heart failure) ([place]) 05/15/2014  . Obesity   . History of colon polyps 03/09/2013  . Renal mass 06/26/2011  . Lytic bone lesion of hip 06/25/2011  . Hypertension 06/24/2011  . Diabetes mellitus ([place]) 06/24/2011    Past Surgical History  Procedure Laterality Date  . Tonsillectomy    . Knee reconstruction, medial patellar femoral ligament    . Cholecystectomy    . Colonoscopy  04/2011    [facility] per patient incomplete  . Tonsillectomy    . Cataract extraction w/phaco Left 09/10/2014    Procedure: CATARACT EXTRACTION PHACO AND INTRAOCULAR LENS PLACEMENT (IOC);  Surgeon: Birder Robson, MD;  Location: [facility] ORS;  Service: Ophthalmology;  Laterality: Left;  [place]: 00:44 AP%: 22.8 CDE: 10.24 Fluid lot #[id number]  . Joint replacement      TKR  . Eye surgery    . Cataract extraction w/phaco  Right 10/15/2014    Procedure: CATARACT EXTRACTION PHACO AND INTRAOCULAR LENS PLACEMENT (IOC);  Surgeon: Birder Robson, MD;  Location: [facility] ORS;  Service: Ophthalmology;  Laterality: Right;  [place]: 00:52 AP:40.1 CDE:11.83 LOT XQJJ #[id number]    Current Outpatient Rx  Name  Route  Sig  Dispense  Refill  . aspirin 81 MG tablet   Oral   Take 81 mg by mouth daily.         Marland Kitchen atorvastatin (LIPITOR) 20 MG tablet   Oral   Take 20 mg by mouth at bedtime.          . benztropine (COGENTIN) 0.5 MG tablet   Oral   Take 1 tablet (0.5 mg total) by mouth 2 (two) times  daily.   60 tablet   0   . citalopram (CELEXA) 10 MG tablet   Oral   Take 1 tablet by mouth at bedtime.          . dicyclomine (BENTYL) 20 MG tablet   Oral   Take 1 tablet (20 mg total) by mouth 3 (three) times daily as needed for spasms.   30 tablet   0   . Difluprednate (DUREZOL) 0.05 % EMUL   Ophthalmic   Apply 1 drop to eye daily. One drop to right eye daily         . docusate sodium (COLACE) 100 MG capsule   Oral   Take 2 capsules (200 mg total) by mouth 2 (two) times daily.   120 capsule   0   . famotidine (PEPCID) 20 MG tablet   Oral   Take 20 mg by mouth 2 (two) times daily.         . fluPHENAZine (PROLIXIN) 5 MG tablet   Oral   Take 3 tablets (15 mg total) by mouth at bedtime.   90 tablet   0   . fluPHENAZine decanoate (PROLIXIN) 25 MG/ML injection   Intramuscular   Inject 1 mL (25 mg total) into the muscle every 14 (fourteen) days.   5 mL   0     Due on 9/22   . furosemide (LASIX) 20 MG tablet   Oral   Take 40 mg by mouth daily as needed.          Marland Kitchen glimepiride (AMARYL) 2 MG tablet   Oral   Take 1 tablet by mouth 2 (two) times daily.         . insulin glargine (LANTUS) 100 UNIT/ML injection   Subcutaneous   Inject 0.15 mLs (15 Units total) into the skin at bedtime.   10 mL   2   . linagliptin (TRADJENTA) 5 MG TABS tablet   Oral   Take 5 mg by mouth daily.         Marland Kitchen lisinopril (PRINIVIL,ZESTRIL) 20 MG tablet   Oral   Take 1 tablet (20 mg total) by mouth every morning.   30 tablet   0   . LORazepam (ATIVAN) 0.5 MG tablet   Oral   Take 0.5 mg by mouth 3 (three) times daily.          . metoCLOPramide (REGLAN) 10 MG tablet   Oral   Take 1 tablet (10 mg total) by mouth every 6 (six) hours as needed for nausea or vomiting.   12 tablet   1   . metoprolol succinate (TOPROL-XL) 50 MG 24 hr tablet   Oral   Take 1 tablet by mouth daily.         Marland Kitchen  nepafenac (ILEVRO) 0.3 % ophthalmic suspension   Right Eye   Place 1 drop  into the right eye daily.         Marland Kitchen senna-docusate (SENOKOT-S) 8.6-50 MG per tablet   Oral   Take 2 tablets by mouth daily as needed for mild constipation or moderate constipation. May use 1-2 times daily as needed         . spironolactone (ALDACTONE) 25 MG tablet   Oral   Take 1 tablet by mouth daily.         Marland Kitchen tiotropium (SPIRIVA) 18 MCG inhalation capsule   Inhalation   Place 1 capsule (18 mcg total) into inhaler and inhale daily.   30 capsule   12     Allergies Haldol; Metformin; and Raspberry  Family History  Problem Relation Age of Onset  . Colon cancer Neg Hx   . Liver disease Neg Hx   . Heart attack Mother     Social History Social History  Substance Use Topics  . Smoking status: Former Research scientist (life sciences)  . Smokeless tobacco: Never Used  . Alcohol Use: No     Comment: occ.    Review of Systems  Constitutional: Negative for fever. Cardiovascular: Negative for chest pain. Respiratory: Negative for shortness of breath. Gastrointestinal: Negative for abdominal pain, vomiting and diarrhea. Genitourinary: Negative for dysuria. Musculoskeletal: Positive for bilateral forearm pain Skin: Negative for rash. Neurological: Positive for mild headache  10-point ROS otherwise negative.  ____________________________________________   PHYSICAL EXAM:  VITAL SIGNS:  80  20   152/110 mmHg  96 %     Constitutional: Alert and oriented. Well appearing and in no distress. Eyes: Conjunctivae are normal. PERRL. Normal extraocular movements. ENT   Head: Normocephalic and atraumatic.   Nose: No congestion/rhinnorhea.   Mouth/Throat: Mucous membranes are moist.   Neck: No stridor. Hematological/Lymphatic/Immunilogical: No cervical lymphadenopathy. Cardiovascular: Normal rate, regular rhythm.  No murmurs, rubs, or gallops. Respiratory: Normal respiratory effort without tachypnea nor retractions. Breath sounds are clear and equal bilaterally. No  wheezes/rales/rhonchi. Gastrointestinal: Soft and nontender. No distention. There is no CVA tenderness. Genitourinary: Deferred Musculoskeletal: No deformity to any extremities. Bilateral lower extremities nontender, full range of motion at the hips. Pelvis stable. Upper extremities without any deformities. Patient without any tenderness to deep palpation of bilateral wrists or forearm. Full range of motion of bilateral wrists, elbows and shoulders. Neurovascularly intact. Bilateral radial pulse 2+. Neurologic:  Normal speech and language. No gross focal neurologic deficits are appreciated. Speech is normal.  Skin:  Skin is warm, dry and intact. No rash noted. Psychiatric: Mood and affect are normal. Speech and behavior are normal. Patient exhibits appropriate insight and judgment.  ____________________________________________    LABS (pertinent positives/negatives)  None  ____________________________________________   EKG  I, Nance Pear, attending physician, personally viewed and interpreted this EKG  EKG Time: 0942 Rate: 75 Rhythm: Normal sinus rhythm Axis: normal Intervals: qtc 437 QRS: narrow, LVH ST changes: no st elevation, t wave inversion I, II, III, V3-V6 Impression: abnormal ekg ____________________________________________    RADIOLOGY  CT head/cervical spine IMPRESSION: 1. Negative for bleed or other acute intracranial process. 2. Negative for cervical fracture or other acute bone abnormality. 3. Cervical spondylitic changes most marked C4-C7 as detailed above. 4. Loss of the normal cervical spine lordosis, which may be secondary to positioning, spasm, or soft tissue injury. 5. Left thyroid nodule.   ____________________________________________   PROCEDURES  Procedure(s) performed: None  Critical Care performed: No  ____________________________________________  INITIAL IMPRESSION / ASSESSMENT AND PLAN / ED COURSE  Pertinent labs & imaging  results that were available during my care of the patient were reviewed by me and considered in my medical decision making (see chart for details).  Patient presents to the emergency department today after a mechanical fall out of the bed. The only traumatic finding on exam is a small contusion over the left eyebrow. Will get head and C-spine CTs. At this point I do not feel that forms or wrists requiring imaging given complete nontender exam and full range of motion at the joints.  ----------------------------------------- 11:41 AM on 12/21/2014 -----------------------------------------  Head CT and cervical spine CT without any acute findings. Prior to discharge I discussed these findings with the patient.  ____________________________________________   FINAL CLINICAL IMPRESSION(S) / ED DIAGNOSES  Final diagnoses:  Fall, initial encounter  Contusion     Nance Pear, MD 12/21/14 1142

## 2014-12-21 NOTE — ED Notes (Addendum)
BIB EMS from [facility]. EMS reports pt fell out of bed this morning and found on floor. Pt complains of bilateral arm pain and wrist pain. Able to stand and pivot on at scene  Pt with abrasion on  Forehead Pt alert and oriented

## 2014-12-21 NOTE — Discharge Instructions (Signed)
Please seek medical attention for any high fevers, chest pain, shortness of breath, change in behavior, persistent vomiting, bloody stool or any other new or concerning symptoms. ° °Fall Prevention in the Home  °Falls can cause injuries and can affect people from all age groups. There are many simple things that you can do to make your home safe and to help prevent falls. °WHAT CAN I DO ON THE OUTSIDE OF MY HOME? °· Regularly repair the edges of walkways and driveways and fix any cracks. °· Remove high doorway thresholds. °· Trim any shrubbery on the main path into your home. °· Use bright outdoor lighting. °· Clear walkways of debris and clutter, including tools and rocks. °· Regularly check that handrails are securely fastened and in good repair. Both sides of any steps should have handrails. °· Install guardrails along the edges of any raised decks or porches. °· Have leaves, snow, and ice cleared regularly. °· Use sand or salt on walkways during winter months. °· In the garage, clean up any spills right away, including grease or oil spills. °WHAT CAN I DO IN THE BATHROOM? °· Use night lights. °· Install grab bars by the toilet and in the tub and shower. Do not use towel bars as grab bars. °· Use non-skid mats or decals on the floor of the tub or shower. °· If you need to sit down while you are in the shower, use a plastic, non-slip stool.. °· Keep the floor dry. Immediately clean up any water that spills on the floor. °· Remove soap buildup in the tub or shower on a regular basis. °· Attach bath mats securely with double-sided non-slip rug tape. °· Remove throw rugs and other tripping hazards from the floor. °WHAT CAN I DO IN THE BEDROOM? °· Use night lights. °· Make sure that a bedside light is easy to reach. °· Do not use oversized bedding that drapes onto the floor. °· Have a firm chair that has side arms to use for getting dressed. °· Remove throw rugs and other tripping hazards from the floor. °WHAT CAN I  DO IN THE KITCHEN?  °· Clean up any spills right away. °· Avoid walking on wet floors. °· Place frequently used items in easy-to-reach places. °· If you need to reach for something above you, use a sturdy step stool that has a grab bar. °· Keep electrical cables out of the way. °· Do not use floor polish or wax that makes floors slippery. If you have to use wax, make sure that it is non-skid floor wax. °· Remove throw rugs and other tripping hazards from the floor. °WHAT CAN I DO IN THE STAIRWAYS? °· Do not leave any items on the stairs. °· Make sure that there are handrails on both sides of the stairs. Fix handrails that are broken or loose. Make sure that handrails are as long as the stairways. °· Check any carpeting to make sure that it is firmly attached to the stairs. Fix any carpet that is loose or worn. °· Avoid having throw rugs at the top or bottom of stairways, or secure the rugs with carpet tape to prevent them from moving. °· Make sure that you have a light switch at the top of the stairs and the bottom of the stairs. If you do not have them, have them installed. °WHAT ARE SOME OTHER FALL PREVENTION TIPS? °· Wear closed-toe shoes that fit well and support your feet. Wear shoes that have rubber soles or low   heels. °· When you use a stepladder, make sure that it is completely opened and that the sides are firmly locked. Have someone hold the ladder while you are using it. Do not climb a closed stepladder. °· Add color or contrast paint or tape to grab bars and handrails in your home. Place contrasting color strips on the first and last steps. °· Use mobility aids as needed, such as canes, walkers, scooters, and crutches. °· Turn on lights if it is dark. Replace any light bulbs that burn out. °· Set up furniture so that there are clear paths. Keep the furniture in the same spot. °· Fix any uneven floor surfaces. °· Choose a carpet design that does not hide the edge of steps of a stairway. °· Be aware of any  and all pets. °· Review your medicines with your healthcare provider. Some medicines can cause dizziness or changes in blood pressure, which increase your risk of falling. °Talk with your health care provider about other ways that you can decrease your risk of falls. This may include working with a physical therapist or trainer to improve your strength, balance, and endurance. °  °This information is not intended to replace advice given to you by your health care provider. Make sure you discuss any questions you have with your health care provider. °  °Document Released: 01/29/2002 Document Revised: 06/25/2014 Document Reviewed: 03/15/2014 °Elsevier Interactive Patient Education ©2016 [employer] ° °

## 2014-12-22 ENCOUNTER — Emergency Department
Admission: EM | Admit: 2014-12-22 | Discharge: 2014-12-22 | Disposition: A | Discharge status: Home / Self Care | Payer: Medicare HMO | Attending: Emergency Medicine | Admitting: Emergency Medicine

## 2014-12-22 ENCOUNTER — Encounter: Payer: Self-pay | Admitting: Emergency Medicine

## 2014-12-22 DIAGNOSIS — E119 Type 2 diabetes mellitus without complications: Secondary | ICD-10-CM | POA: Insufficient documentation

## 2014-12-22 DIAGNOSIS — Z87891 Personal history of nicotine dependence: Secondary | ICD-10-CM | POA: Diagnosis not present

## 2014-12-22 DIAGNOSIS — N189 Chronic kidney disease, unspecified: Secondary | ICD-10-CM | POA: Insufficient documentation

## 2014-12-22 DIAGNOSIS — G8929 Other chronic pain: Secondary | ICD-10-CM | POA: Diagnosis not present

## 2014-12-22 DIAGNOSIS — W1839XD Other fall on same level, subsequent encounter: Secondary | ICD-10-CM | POA: Insufficient documentation

## 2014-12-22 DIAGNOSIS — T148 Other injury of unspecified body region: Secondary | ICD-10-CM | POA: Diagnosis present

## 2014-12-22 DIAGNOSIS — R461 Bizarre personal appearance: Secondary | ICD-10-CM | POA: Insufficient documentation

## 2014-12-22 DIAGNOSIS — I129 Hypertensive chronic kidney disease with stage 1 through stage 4 chronic kidney disease, or unspecified chronic kidney disease: Secondary | ICD-10-CM | POA: Diagnosis not present

## 2014-12-22 MED ORDER — OXYCODONE-ACETAMINOPHEN 5-325 MG PO TABS
2.0000 | ORAL_TABLET | Freq: Once | ORAL | Status: AC
  Administered 2014-12-22: 2 via ORAL
  Filled 2014-12-22: qty 2

## 2014-12-22 MED ORDER — TRAMADOL HCL 50 MG PO TABS: 50.0000 mg | ORAL_TABLET | Freq: Four times a day (QID) | ORAL | Status: DC | PRN

## 2014-12-22 NOTE — ED Provider Notes (Signed)
[facility] Emergency Department Provider Note     Time seen: ----------------------------------------- 7:47 PM on 12/22/2014 -----------------------------------------    I have reviewed the triage vital signs and the nursing notes.   HISTORY  Chief Complaint Fall    HPI [name] [name] is a 69 y.o. female who presents for soreness all over since a fall about a week ago. According to report this is the patient's fourth visit for the same fall.patient is complaining of diffuse pain particularly in both arms since the fall. Patient states started for a walk and that her son was going get a cane for her. She denies any fevers or chills, she states she has been vomiting however. Nothing makes her pain better or worse.   Past Medical History  Diagnosis Date  . DM2 (diabetes mellitus, type 2) ([place])   . Hypertension   . Hypercholesteremia   . Schizophrenia ([place])   . Osteoarthritis   . Bursitis   . OSA (obstructive sleep apnea)     does not use machine  . Left ventricular outflow tract obstruction     a. echo 03/2014: EF 60-65%, hypernamic LV systolic fxn, mod LVH w/ LVOT gradient estimated at 68 mm Hg w/ valsalva, very small LV internal cavity size, mildly increased LV posterior wall thickness, mild Ao valve scl w/o stenosis, diastolic dysfunction, normal RVSP  . LVH (left ventricular hypertrophy)     a. echo suggests long standing uncontrolled htn. she will not do well when dehydrated, LV cavity obliteration  . Obesity   . CHF (congestive heart failure) ([place])   . Anxiety   . COPD (chronic obstructive pulmonary disease) ([place])   . Bronchitis   . CAD (coronary artery disease)   . Tremors of nervous system   . GERD (gastroesophageal reflux disease)   . Environmental allergies   . Arthritis   . Depression   . Chronic kidney disease     shadow on x-ray  . Muscle weakness   . Chronic cough   . Lower extremity edema   . Wheezing   . On supplemental  oxygen therapy     AS NEEDED    Patient Active Problem List   Diagnosis Date Noted  . Schizoaffective disorder, bipolar type with good prognostic features ([place]) 10/30/2014  . Urinary incontinence 10/30/2014  . GERD (gastroesophageal reflux disease) 10/30/2014  . OSA (obstructive sleep apnea) 10/30/2014  . Osteoarthrosis, unspecified whether generalized or localized, involving lower leg 10/30/2014  . COPD (chronic obstructive pulmonary disease) ([place]) 10/30/2014  . Chronic diastolic CHF (congestive heart failure) ([place]) 05/15/2014  . Obesity   . History of colon polyps 03/09/2013  . Renal mass 06/26/2011  . Lytic bone lesion of hip 06/25/2011  . Hypertension 06/24/2011  . Diabetes mellitus ([place]) 06/24/2011    Past Surgical History  Procedure Laterality Date  . Tonsillectomy    . Knee reconstruction, medial patellar femoral ligament    . Cholecystectomy    . Colonoscopy  04/2011    [facility] per patient incomplete  . Tonsillectomy    . Cataract extraction w/phaco Left 09/10/2014    Procedure: CATARACT EXTRACTION PHACO AND INTRAOCULAR LENS PLACEMENT (IOC);  Surgeon: Birder Robson, MD;  Location: [facility] ORS;  Service: Ophthalmology;  Laterality: Left;  [place]: 00:44 AP%: 22.8 CDE: 10.24 Fluid lot #[id number]  . Joint replacement      TKR  . Eye surgery    . Cataract extraction w/phaco Right 10/15/2014    Procedure: CATARACT EXTRACTION PHACO AND INTRAOCULAR  LENS PLACEMENT (IOC);  Surgeon: Birder Robson, MD;  Location: [facility] ORS;  Service: Ophthalmology;  Laterality: Right;  [place]: 00:52 AP:40.1 CDE:11.83 LOT PACK #[id number]    Allergies Haldol; Metformin; and Raspberry  Social History Social History  Substance Use Topics  . Smoking status: Former Research scientist (life sciences)  . Smokeless tobacco: Never Used  . Alcohol Use: No     Comment: occ.    Review of Systems Constitutional: Negative for fever. Eyes: Negative for visual changes. ENT: Negative for sore throat. Cardiovascular: Negative for chest  pain. Respiratory: Negative for shortness of breath. Gastrointestinal: Negative for abdominal pain, positive for vomiting. Genitourinary: Negative for dysuria. Musculoskeletal: positive for diffuse whole-body pain, worse in bilateral upper extremities Skin: Negative for rash. Neurological: Negative for headaches, focal weakness or numbness.positive for difficulty with ambulation  10-point ROS otherwise negative.  ____________________________________________   PHYSICAL EXAM:  VITAL SIGNS: ED Triage Vitals  Enc Vitals Group     BP 12/22/14 1929 169/91 mmHg     Pulse Rate 12/22/14 1929 89     Resp 12/22/14 1929 14     Temp 12/22/14 1929 97.9 F (36.6 C)     Temp Source 12/22/14 1929 Oral     SpO2 12/22/14 1929 94 %     Weight 12/22/14 1929 273 lb (123.832 kg)     Height 12/22/14 1929 5\' 6"  (1.676 m)     Head Cir --      Peak Flow --      Pain Score 12/22/14 1931 7     Pain Loc --      Pain Edu? --      Excl. in [place]? --     Constitutional: Alert and oriented. Well appearing and in no distress. Eyes: Conjunctivae are normal. PERRL. Normal extraocular movements. ENT   Head: Normocephalic and atraumatic.   Nose: No congestion/rhinnorhea.   Mouth/Throat: Mucous membranes are moist.   Neck: No stridor. Cardiovascular: Normal rate, regular rhythm. Normal and symmetric distal pulses are present in all extremities. No murmurs, rubs, or gallops. Respiratory: Normal respiratory effort without tachypnea nor retractions. Breath sounds are clear and equal bilaterally. No wheezes/rales/rhonchi. Gastrointestinal: Soft and nontender. No distention. No abdominal bruits.  Musculoskeletal: her entire body is tender to touch, pain is out of proportion to examination. Neurologic:  Normal speech and language. No gross focal neurologic deficits are appreciated. Speech is normal. No gait instability. Skin:  Skin is warm, dry and intact. No rash noted. Psychiatric: bizarre mood and  affect.  ____________________________________________  ED COURSE:  Pertinent labs & imaging results that were available during my care of the patient were reviewed by me and considered in my medical decision making (see chart for details). Diffuse whole body pain of unclear etiology. I will need to review her recent visits and reevaluate.  ____________________________________________  FINAL ASSESSMENT AND PLAN  Chronic pain  Plan: Patient with labs and imaging as dictated above. I will prescribe some pain medicine for her to take at home. It looks like she has chronic pain. I do not find any acute emergency medical condition at this time. This may have a schizophrenic component to it. She'll be discharged with tramadol.   Earleen Newport, MD   Earleen Newport, MD 12/22/14 [phone]

## 2014-12-22 NOTE — ED Notes (Signed)
Per pt she fell a week ago and has been "sore all over" since. Pt is oriented at this time. This is pts fourth visit for this fall.

## 2014-12-22 NOTE — ED Notes (Signed)
EMS pt from home; transport team reports pt says she cannot walk but was able to ambulate down the steps and out to the ambulance; pt was seen here the beginning of the week for a fall; all tests were negative for fractures; pt has returned several times since fall for body aches and pains; pt tearful upon arrival;

## 2014-12-22 NOTE — Discharge Instructions (Signed)

## 2014-12-22 NOTE — ED Notes (Signed)
PT complains of generalized body pain upon assessment.  Pt is tender to touch anywhere on body that she is touched.  Dr. Jimmye Norman at bedside.

## 2014-12-23 ENCOUNTER — Emergency Department
Admission: EM | Admit: 2014-12-23 | Discharge: 2014-12-23 | Disposition: A | Discharge status: Home / Self Care | Payer: Medicare HMO | Attending: Emergency Medicine | Admitting: Emergency Medicine

## 2014-12-23 ENCOUNTER — Encounter: Payer: Self-pay | Admitting: Emergency Medicine

## 2014-12-23 DIAGNOSIS — Y9289 Other specified places as the place of occurrence of the external cause: Secondary | ICD-10-CM | POA: Diagnosis not present

## 2014-12-23 DIAGNOSIS — Z87891 Personal history of nicotine dependence: Secondary | ICD-10-CM | POA: Insufficient documentation

## 2014-12-23 DIAGNOSIS — Y998 Other external cause status: Secondary | ICD-10-CM | POA: Insufficient documentation

## 2014-12-23 DIAGNOSIS — F250 Schizoaffective disorder, bipolar type: Secondary | ICD-10-CM | POA: Insufficient documentation

## 2014-12-23 DIAGNOSIS — Y9389 Activity, other specified: Secondary | ICD-10-CM | POA: Insufficient documentation

## 2014-12-23 DIAGNOSIS — I10 Essential (primary) hypertension: Secondary | ICD-10-CM | POA: Diagnosis not present

## 2014-12-23 DIAGNOSIS — M791 Myalgia, unspecified site: Secondary | ICD-10-CM

## 2014-12-23 DIAGNOSIS — Z794 Long term (current) use of insulin: Secondary | ICD-10-CM | POA: Diagnosis not present

## 2014-12-23 DIAGNOSIS — R531 Weakness: Secondary | ICD-10-CM | POA: Insufficient documentation

## 2014-12-23 DIAGNOSIS — E119 Type 2 diabetes mellitus without complications: Secondary | ICD-10-CM | POA: Diagnosis not present

## 2014-12-23 DIAGNOSIS — T148 Other injury of unspecified body region: Secondary | ICD-10-CM | POA: Insufficient documentation

## 2014-12-23 DIAGNOSIS — Z79899 Other long term (current) drug therapy: Secondary | ICD-10-CM | POA: Diagnosis not present

## 2014-12-23 DIAGNOSIS — W108XXA Fall (on) (from) other stairs and steps, initial encounter: Secondary | ICD-10-CM | POA: Insufficient documentation

## 2014-12-23 DIAGNOSIS — R451 Restlessness and agitation: Secondary | ICD-10-CM | POA: Diagnosis present

## 2014-12-23 LAB — COMPREHENSIVE METABOLIC PANEL
ALT: 24 U/L (ref 14–54)
ANION GAP: 9 (ref 5–15)
AST: 41 U/L (ref 15–41)
Albumin: 4 g/dL (ref 3.5–5.0)
Alkaline Phosphatase: 64 U/L (ref 38–126)
BUN: 15 mg/dL (ref 6–20)
CHLORIDE: 104 mmol/L (ref 101–111)
CO2: 30 mmol/L (ref 22–32)
CREATININE: 0.91 mg/dL (ref 0.44–1.00)
Calcium: 9.4 mg/dL (ref 8.9–10.3)
GFR calc non Af Amer: >60 mL/min (ref >60)
Glucose, Bld: 164 mg/dL — ABNORMAL HIGH (ref 65–99)
POTASSIUM: 3.9 mmol/L (ref 3.5–5.1)
SODIUM: 143 mmol/L (ref 135–145)
Total Bilirubin: 0.4 mg/dL (ref 0.3–1.2)
Total Protein: 7.8 g/dL (ref 6.5–8.1)

## 2014-12-23 LAB — URINALYSIS COMPLETE WITH MICROSCOPIC (ARMC ONLY)
BILIRUBIN URINE: NEGATIVE
Bacteria, UA: NONE SEEN
GLUCOSE, UA: NEGATIVE mg/dL
Hgb urine dipstick: NEGATIVE
LEUKOCYTES UA: NEGATIVE
Nitrite: NEGATIVE
PH: 6 (ref 5.0–8.0)
Protein, ur: 30 mg/dL — AB
Specific Gravity, Urine: 1.018 (ref 1.005–1.030)

## 2014-12-23 LAB — CBC WITH DIFFERENTIAL/PLATELET
Basophils Absolute: 0.1 10*3/uL (ref 0–0.1)
Basophils Relative: 1 %
EOS ABS: 0.1 10*3/uL (ref 0–0.7)
HCT: 43.6 % (ref 35.0–47.0)
Hemoglobin: 14.2 g/dL (ref 12.0–16.0)
LYMPHS ABS: 2.8 10*3/uL (ref 1.0–3.6)
Lymphocytes Relative: 28 %
MCH: 28 pg (ref 26.0–34.0)
MCHC: 32.5 g/dL (ref 32.0–36.0)
MCV: 86.1 fL (ref 80.0–100.0)
Monocytes Absolute: 1.3 10*3/uL — ABNORMAL HIGH (ref 0.2–0.9)
Monocytes Relative: 13 %
Neutro Abs: 5.6 10*3/uL (ref 1.4–6.5)
Neutrophils Relative %: 57 %
PLATELETS: 199 10*3/uL (ref 150–440)
RBC: 5.06 MIL/uL (ref 3.80–5.20)
RDW: 14.1 % (ref 11.5–14.5)
WBC: 9.9 10*3/uL (ref 3.6–11.0)

## 2014-12-23 LAB — C-REACTIVE PROTEIN: CRP: <0.5 mg/dL (ref <1.0)

## 2014-12-23 LAB — CK: CK TOTAL: 743 U/L — AB (ref 38–234)

## 2014-12-23 LAB — SEDIMENTATION RATE: Sed Rate: 8 mm/hr (ref 0–30)

## 2014-12-23 MED ORDER — IBUPROFEN 600 MG PO TABS
600.0000 mg | ORAL_TABLET | Freq: Once | ORAL | Status: AC
  Administered 2014-12-23: 600 mg via ORAL

## 2014-12-23 MED ORDER — LISINOPRIL 20 MG PO TABS
ORAL_TABLET | ORAL | Status: AC
  Administered 2014-12-23: 20 mg
  Filled 2014-12-23: qty 1

## 2014-12-23 MED ORDER — IBUPROFEN 600 MG PO TABS
ORAL_TABLET | ORAL | Status: AC
  Filled 2014-12-23: qty 1

## 2014-12-23 NOTE — Consult Note (Signed)
[facility] Face-to-Face Psychiatry Consult   Reason for Consult:  Consult for this 69 year old woman with a history of schizoaffective disorder currently in the emergency room because of an acute fall. Referring Physician:  Archie Balboa Patient Identification: [name] MRN:  [id number] Principal Diagnosis: Schizoaffective disorder [facility]) Diagnosis:   Patient Active Problem List   Diagnosis Date Noted  . Schizoaffective disorder ([place]) [F25.9] 12/23/2014  . Schizoaffective disorder, bipolar type with good prognostic features ([place]) [F25.0] 10/30/2014  . Urinary incontinence [R32] 10/30/2014  . GERD (gastroesophageal reflux disease) [K21.9] 10/30/2014  . OSA (obstructive sleep apnea) [G47.33] 10/30/2014  . Osteoarthrosis, unspecified whether generalized or localized, involving lower leg [M17.9] 10/30/2014  . COPD (chronic obstructive pulmonary disease) ([place]) [J44.9] 10/30/2014  . Chronic diastolic CHF (congestive heart failure) ([place]) [I50.32] 05/15/2014  . Obesity [E66.9]   . History of colon polyps [Z86.010] 03/09/2013  . Renal mass [N28.89] 06/26/2011  . 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 Hypertension [I10] 06/24/2011  . Diabetes mellitus ([place]) [E11.9] 06/24/2011    Total Time spent with patient: 45 minutes  Subjective:   [name] [name] is a 70 y.o. female patient admitted with "I'm not going back to that place".  HPI:  Information from the patient and the chart. Case discussed with the emergency room physician. Patient interviewed. Reviewed her old chart but I am also familiar with this patient from multiple previous encounters. Patient is currently residing in a local group home and was brought into the emergency room today because of a fall. Patient tells me that she fell out of bed. She is complaining that the owner of the group home is "mean" to her. She is a little bit vague about what that means but one complaint she has is that she is not being allowed to make phone calls  as much as she would like to. Patient states that she refuses to go back to the group home. She says as an alternative that she will go to stay in the homeless shelter. When I pointed out the impracticality of that she suggested she would go live with her son in [name]. As far as specific symptoms the patient is not presenting with a lot of complaints. As usual she blames most of her distress on other people and the circumstances around her. She denies any suicidal thoughts. Not presenting any homicidal thoughts. She does have paranoia as usual that there are drug dealers around her. Doesn't describe anything that sounds like obvious hallucinations. She continues to have services from an act team visited her regularly. Patient is not reporting any suicidal or homicidal thoughts. She apparently has been compliant with medicine. No substance abuse.  Patient has a long-standing mental health problems essentially lifelong. She has had multiple hospitalizations. Her son who lives locally finally got guardianship of her and has gotten her placed in a group home. This has solve some of the problem that occurred when she was living independently but has increased the amount of discord she has trying to get along with other people around her. Patient apparently has 2 sons one of whom lives locally. She talks about the other one living in [name] I don't know the details of that.  Medical history: Multiple serious long-standing medical problems including diabetes, high blood pressure, history of multiple urinary tract infections, history of cellulitis and deep skin infections chronic pain from osteoarthritis CHF.  Family history: Patient does not know of any family history unknown think we've identified a positive  family history of mental illness.  Substance abuse history: No substance abuse history. Patient does not drink or abuse drugs never has.  Past Psychiatric History: Long-standing mental health problems.  Multiple hospitalizations. Even with the most aggressive psychiatric treatment the patient is often argumentative and paranoid and can be difficult to reason with. She has been aggressive in the past. I don't believe she is ever tried to kill her self.  Risk to Self: Is patient at risk for suicide?: No Risk to Others:   Prior Inpatient Therapy:   Prior Outpatient Therapy:    Past Medical History:  Past Medical History  Diagnosis Date  . DM2 (diabetes mellitus, type 2) ([place])   . Hypertension   . Hypercholesteremia   . Schizophrenia ([place])   . Osteoarthritis   . Bursitis   . OSA (obstructive sleep apnea)     does not use machine  . Left ventricular outflow tract obstruction     a. echo 03/2014: EF 60-65%, hypernamic LV systolic fxn, mod LVH w/ LVOT gradient estimated at 68 mm Hg w/ valsalva, very small LV internal cavity size, mildly increased LV posterior wall thickness, mild Ao valve scl w/o stenosis, diastolic dysfunction, normal RVSP  . LVH (left ventricular hypertrophy)     a. echo suggests long standing uncontrolled htn. she will not do well when dehydrated, LV cavity obliteration  . Obesity   . CHF (congestive heart failure) ([place])   . Anxiety   . COPD (chronic obstructive pulmonary disease) ([place])   . Bronchitis   . CAD (coronary artery disease)   . Tremors of nervous system   . GERD (gastroesophageal reflux disease)   . Environmental allergies   . Arthritis   . Depression   . Chronic kidney disease     shadow on x-ray  . Muscle weakness   . Chronic cough   . Lower extremity edema   . Wheezing   . On supplemental oxygen therapy     AS NEEDED    Past Surgical History  Procedure Laterality Date  . Tonsillectomy    . Knee reconstruction, medial patellar femoral ligament    . Cholecystectomy    . Colonoscopy  04/2011    [facility] per patient incomplete  . Tonsillectomy    . Cataract extraction w/phaco Left 09/10/2014    Procedure: CATARACT EXTRACTION PHACO AND INTRAOCULAR  LENS PLACEMENT (IOC);  Surgeon: Birder Robson, MD;  Location: [facility] ORS;  Service: Ophthalmology;  Laterality: Left;  [place]: 00:44 AP%: 22.8 CDE: 10.24 Fluid lot #[id number]  . Joint replacement      TKR  . Eye surgery    . Cataract extraction w/phaco Right 10/15/2014    Procedure: CATARACT EXTRACTION PHACO AND INTRAOCULAR LENS PLACEMENT (IOC);  Surgeon: Birder Robson, MD;  Location: [facility] ORS;  Service: Ophthalmology;  Laterality: Right;  [place]: 00:52 AP:40.1 CDE:11.83 LOT OXBD #[id number]   Family History:  Family History  Problem Relation Age of Onset  . Colon cancer Neg Hx   . Liver disease Neg Hx   . Heart attack Mother    Family Psychiatric  History: Patient is not reporting any and I don't believe that we know of any family history of mental illness Social History:  History  Alcohol Use No    Comment: occ.     History  Drug Use No    Social History   Social History  . Marital Status: Widowed    Spouse Name: N/A  . Number of Children: 2  . Years  of Education: N/A   Social History Main Topics  . Smoking status: Former Research scientist (life sciences)  . Smokeless tobacco: Never Used  . Alcohol Use: No     Comment: occ.  . Drug Use: No  . Sexual Activity: Not Currently   Other Topics Concern  . None   Social History Narrative   Additional Social History:                          Allergies:   Allergies  Allergen Reactions  . Haldol [Haloperidol Decanoate] Swelling and Other (See Comments)    Tongue swells and blurred vision  . Metformin Diarrhea  . Raspberry Swelling    Other reaction(s): SWELLINGin lips    Labs:  Results for orders placed or performed during the hospital encounter of 12/23/14 (from the past 48 hour(s))  CBC with Differential     Status: Abnormal   Collection Time: 12/23/14  8:01 AM  Result Value Ref Range   WBC 9.9 3.6 - 11.0 K/uL   RBC 5.06 3.80 - 5.20 MIL/uL   Hemoglobin 14.2 12.0 - 16.0 g/dL   HCT 43.6 35.0 - 47.0 %   MCV 86.1 80.0 - 100.0 fL    MCH 28.0 26.0 - 34.0 pg   MCHC 32.5 32.0 - 36.0 g/dL   RDW 14.1 11.5 - 14.5 %   Platelets 199 150 - 440 K/uL    Comment: PLATELET CLUMPS NOTED ON SMEAR, COUNT APPEARS ADEQUATE   Neutrophils Relative % 57% %   Neutro Abs 5.6 1.4 - 6.5 K/uL   Lymphocytes Relative 28% %   Lymphs Abs 2.8 1.0 - 3.6 K/uL   Monocytes Relative 13% %   Monocytes Absolute 1.3 (H) 0.2 - 0.9 K/uL   Eosinophils Relative 1% %   Eosinophils Absolute 0.1 0 - 0.7 K/uL   Basophils Relative 1% %   Basophils Absolute 0.1 0 - 0.1 K/uL  Comprehensive metabolic panel     Status: Abnormal   Collection Time: 12/23/14  8:01 AM  Result Value Ref Range   Sodium 143 135 - 145 mmol/L   Potassium 3.9 3.5 - 5.1 mmol/L   Chloride 104 101 - 111 mmol/L   CO2 30 22 - 32 mmol/L   Glucose, Bld 164 (H) 65 - 99 mg/dL   BUN 15 6 - 20 mg/dL   Creatinine, Ser 0.91 0.44 - 1.00 mg/dL   Calcium 9.4 8.9 - 10.3 mg/dL   Total Protein 7.8 6.5 - 8.1 g/dL   Albumin 4.0 3.5 - 5.0 g/dL   AST 41 15 - 41 U/L   ALT 24 14 - 54 U/L   Alkaline Phosphatase 64 38 - 126 U/L   Total Bilirubin 0.4 0.3 - 1.2 mg/dL   GFR calc non Af Amer >60 >60 mL/min   GFR calc Af Amer >60 >60 mL/min    Comment: (NOTE) The eGFR has been calculated using the CKD EPI equation. This calculation has not been validated in all clinical situations. eGFR's persistently <60 mL/min signify possible Chronic Kidney Disease.    Anion gap 9 5 - 15  CK     Status: Abnormal   Collection Time: 12/23/14  8:01 AM  Result Value Ref Range   Total CK 743 (H) 38 - 234 U/L  Sedimentation rate     Status: None   Collection Time: 12/23/14  8:01 AM  Result Value Ref Range   Sed Rate 8 0 - 30 mm/hr  C-reactive  protein     Status: None   Collection Time: 12/23/14  8:01 AM  Result Value Ref Range   CRP <0.5 <1.0 mg/dL    Comment: Performed at [facility]  Urinalysis complete, with microscopic [facility] only)     Status: Abnormal   Collection Time: 12/23/14  9:34 AM  Result Value  Ref Range   Color, Urine YELLOW (A) YELLOW   APPearance HAZY (A) CLEAR   Glucose, UA NEGATIVE NEGATIVE mg/dL   Bilirubin Urine NEGATIVE NEGATIVE   Ketones, ur TRACE (A) NEGATIVE mg/dL   Specific Gravity, Urine 1.018 1.005 - 1.030   Hgb urine dipstick NEGATIVE NEGATIVE   pH 6.0 5.0 - 8.0   Protein, ur 30 (A) NEGATIVE mg/dL   Nitrite NEGATIVE NEGATIVE   Leukocytes, UA NEGATIVE NEGATIVE   RBC / HPF 0-5 0 - 5 RBC/hpf   WBC, UA 0-5 0 - 5 WBC/hpf   Bacteria, UA NONE SEEN NONE SEEN   Squamous Epithelial / LPF 0-5 (A) NONE SEEN   Mucous PRESENT     No current facility-administered medications for this encounter.   Current Outpatient Prescriptions  Medication Sig Dispense Refill  . benztropine (COGENTIN) 0.5 MG tablet Take 1 tablet (0.5 mg total) by mouth 2 (two) times daily. 60 tablet 0  . citalopram (CELEXA) 10 MG tablet Take 1 tablet by mouth at bedtime.     . dicyclomine (BENTYL) 20 MG tablet Take 1 tablet (20 mg total) by mouth 3 (three) times daily as needed for spasms. 30 tablet 0  . Difluprednate (DUREZOL) 0.05 % EMUL Place 1 drop into both eyes daily.     Marland Kitchen docusate sodium (COLACE) 100 MG capsule Take 2 capsules (200 mg total) by mouth 2 (two) times daily. 120 capsule 0  . fluPHENAZine (PROLIXIN) 5 MG tablet Take 3 tablets (15 mg total) by mouth at bedtime. 90 tablet 0  . fluPHENAZine decanoate (PROLIXIN) 25 MG/ML injection Inject 1 mL (25 mg total) into the muscle every 14 (fourteen) days. 5 mL 0  . lisinopril (PRINIVIL,ZESTRIL) 20 MG tablet Take 1 tablet (20 mg total) by mouth every morning. 30 tablet 0  . LORazepam (ATIVAN) 0.5 MG tablet Take 0.5 mg by mouth 3 (three) times daily.     . metoCLOPramide (REGLAN) 10 MG tablet Take 1 tablet (10 mg total) by mouth every 6 (six) hours as needed for nausea or vomiting. 12 tablet 1  . Mometasone Furo-Formoterol Fum (DULERA IN) Inhale 2 puffs into the lungs 2 (two) times daily.    Marland Kitchen PRESCRIPTION MEDICATION Apply 1 application topically 2  (two) times daily. Triamcinolone cream    . insulin glargine (LANTUS) 100 UNIT/ML injection Inject 0.15 mLs (15 Units total) into the skin at bedtime. (Patient not taking: Reported on 12/23/2014) 10 mL 2  . tiotropium (SPIRIVA) 18 MCG inhalation capsule Place 1 capsule (18 mcg total) into inhaler and inhale daily. (Patient not taking: Reported on 12/23/2014) 30 capsule 12  . traMADol (ULTRAM) 50 MG tablet Take 1 tablet (50 mg total) by mouth every 6 (six) hours as needed. (Patient not taking: Reported on 12/23/2014) 20 tablet 0    Musculoskeletal: Strength & Muscle Tone: decreased Gait & Station: unsteady Patient leans: N/A  Psychiatric Specialty Exam: Review of Systems  Constitutional: Negative.   Eyes: Negative.   Respiratory: Negative.   Cardiovascular: Negative.   Gastrointestinal: Negative.   Musculoskeletal: Positive for back pain and neck pain.  Skin: Negative.   Neurological: Negative.   Psychiatric/Behavioral:  Negative for depression, suicidal ideas, hallucinations, memory loss and substance abuse. The patient is nervous/anxious. The patient does not have insomnia.     Blood pressure 170/80, pulse 70, temperature 97.8 F (36.6 C), temperature source Oral, resp. rate 16, height '5\' 6"'  (1.676 m), weight 123.832 kg (273 lb), SpO2 93 %.Body mass index is 44.08 kg/(m^2).  General Appearance: Disheveled  Eye Contact::  Good  Speech:  Garbled and Normal Rate  Volume:  Normal  Mood:  Irritable  Affect:  Labile  Thought Process:  Tangential  Orientation:  Full (Time, Place, and Person)  Thought Content:  Paranoid Ideation  Suicidal Thoughts:  No  Homicidal Thoughts:  No  Memory:  Immediate;   Fair Recent;   Fair Remote;   Fair Full testing not completed but the patient has a grasp of recent events and remembered me immediately indicating at least adequate short and long-term memory function  Judgement:  Impaired  Insight:  Shallow  Psychomotor Activity:  Decreased   Concentration:  Poor  Recall:  [place]: Fair  Akathisia:  No  Handed:  Right  AIMS (if indicated):     Assets:  Communication Skills Desire for Improvement Housing Resilience Social Support  ADL's:  Impaired  Cognition: Impaired,  Mild  Sleep:      Treatment Plan Summary: Plan Ace reviewed and discussed with Dr. Archie Balboa. I'm afraid that the patient is at her baseline. I don't believe that she ever gets much better than this and she is always bound to be somewhat cranky and hard to please. The more someone tries to control her situation and her actions usually the more conflict there will be. Given her medical problems I anticipate that her group home is not likely to be a good fit for her for very long and she will probably need a higher level of placement before long. I don't see any indication to adjust her medicines. She has very good outpatient psychiatric treatment. Tried to offer some supportive and comforting counseling to the patient. No other change to treatment plan does not require commitment.  Disposition: Patient does not meet criteria for psychiatric inpatient admission. Supportive therapy provided about ongoing stressors.  Melaysia Streed 12/23/2014 5:51 PM

## 2014-12-23 NOTE — ED Notes (Signed)
Pts son notified of d/c, stated he was very upset and that she needs to be kept for mental health issues. This RN and Dr Archie Balboa explained to pts son that Dr Weber Cooks assessed pt and felt there was no immediate mental health issue to keep pt in hospital. Pts son stated, "i'll just turn around and bring her right back then."

## 2014-12-23 NOTE — ED Notes (Signed)
Pt resting, warm blanket and Bible given per request.

## 2014-12-23 NOTE — ED Notes (Signed)
ems pt from group home fell earlier this week was seen here x4 for the same states feels bugs crawling all over

## 2014-12-23 NOTE — ED Notes (Signed)
Pt resting.

## 2014-12-23 NOTE — ED Provider Notes (Signed)
[facility] Emergency Department Provider Note  ____________________________________________  Time seen: 7:05 AM  I have reviewed the triage vital signs and the nursing notes.   HISTORY  Chief Complaint Fall     HPI [name] [name] is a 69 y.o. female who presents to the emergency department reporting that she is "sore". She is a bit vague at first about this soreness. She reports it feels as though someone gave her an injection or as though someone drew blood from her, but does not describe any focal complaint, but rather just soreness all over. She is vague about how long this has been occurring, but reports this is been occurring since she began coming here to [facility]. As I try to clarify this time the, she confirms that the soreness and discomfort she has has been occurring for at least 6 months. The patient also reports having had another fall this morning. This was after they had called 911 but before EMS arrived. She reports she was scooting down the steps and fell. She is not able to give any further details other than that she did not lose consciousness. There does not appear to be a focal complaints related to this morning's fall. She does have a history of recent falls. She has had multiple emergency department visits for falls and myalgias.  The patient is rather tangential during the interview. She starts to speak about how someone told on her. This is her roommate at the boarding house. She speaks at length about her being unhappy in that location and asked for my help: "get me out of there".   She also speaks about her son who is "no good". She reports he will help her.  She was a patient of Dr. Jerene Dilling. It is unclear if she has a primary care physician currently. She reports she was supposed to make an appointment at the "clinic", but which will limit his unclear at this time. She reports she wants her son to help her to do this  more.     Past Medical History  Diagnosis Date  . DM2 (diabetes mellitus, type 2) ([place])   . Hypertension   . Hypercholesteremia   . Schizophrenia ([place])   . Osteoarthritis   . Bursitis   . OSA (obstructive sleep apnea)     does not use machine  . Left ventricular outflow tract obstruction     a. echo 03/2014: EF 60-65%, hypernamic LV systolic fxn, mod LVH w/ LVOT gradient estimated at 68 mm Hg w/ valsalva, very small LV internal cavity size, mildly increased LV posterior wall thickness, mild Ao valve scl w/o stenosis, diastolic dysfunction, normal RVSP  . LVH (left ventricular hypertrophy)     a. echo suggests long standing uncontrolled htn. she will not do well when dehydrated, LV cavity obliteration  . Obesity   . CHF (congestive heart failure) ([place])   . Anxiety   . COPD (chronic obstructive pulmonary disease) ([place])   . Bronchitis   . CAD (coronary artery disease)   . Tremors of nervous system   . GERD (gastroesophageal reflux disease)   . Environmental allergies   . Arthritis   . Depression   . Chronic kidney disease     shadow on x-ray  . Muscle weakness   . Chronic cough   . Lower extremity edema   . Wheezing   . On supplemental oxygen therapy     AS NEEDED    Patient Active Problem List  Diagnosis Date Noted  . Schizoaffective disorder, bipolar type with good prognostic features ([place]) 10/30/2014  . Urinary incontinence 10/30/2014  . GERD (gastroesophageal reflux disease) 10/30/2014  . OSA (obstructive sleep apnea) 10/30/2014  . Osteoarthrosis, unspecified whether generalized or localized, involving lower leg 10/30/2014  . COPD (chronic obstructive pulmonary disease) ([place]) 10/30/2014  . Chronic diastolic CHF (congestive heart failure) ([place]) 05/15/2014  . Obesity   . History of colon polyps 03/09/2013  . Renal mass 06/26/2011  . Lytic bone lesion of hip 06/25/2011  . Hypertension 06/24/2011  . Diabetes mellitus ([place]) 06/24/2011    Past Surgical History   Procedure Laterality Date  . Tonsillectomy    . Knee reconstruction, medial patellar femoral ligament    . Cholecystectomy    . Colonoscopy  04/2011    [facility] per patient incomplete  . Tonsillectomy    . Cataract extraction w/phaco Left 09/10/2014    Procedure: CATARACT EXTRACTION PHACO AND INTRAOCULAR LENS PLACEMENT (IOC);  Surgeon: Birder Robson, MD;  Location: [facility] ORS;  Service: Ophthalmology;  Laterality: Left;  [place]: 00:44 AP%: 22.8 CDE: 10.24 Fluid lot #[id number]  . Joint replacement      TKR  . Eye surgery    . Cataract extraction w/phaco Right 10/15/2014    Procedure: CATARACT EXTRACTION PHACO AND INTRAOCULAR LENS PLACEMENT (IOC);  Surgeon: Birder Robson, MD;  Location: [facility] ORS;  Service: Ophthalmology;  Laterality: Right;  [place]: 00:52 AP:40.1 CDE:11.83 LOT KGUR #[id number]    Current Outpatient Rx  Name  Route  Sig  Dispense  Refill  . benztropine (COGENTIN) 0.5 MG tablet   Oral   Take 1 tablet (0.5 mg total) by mouth 2 (two) times daily.   60 tablet   0   . citalopram (CELEXA) 10 MG tablet   Oral   Take 1 tablet by mouth at bedtime.          . dicyclomine (BENTYL) 20 MG tablet   Oral   Take 1 tablet (20 mg total) by mouth 3 (three) times daily as needed for spasms.   30 tablet   0   . Difluprednate (DUREZOL) 0.05 % EMUL   Both Eyes   Place 1 drop into both eyes daily.          Marland Kitchen docusate sodium (COLACE) 100 MG capsule   Oral   Take 2 capsules (200 mg total) by mouth 2 (two) times daily.   120 capsule   0   . fluPHENAZine (PROLIXIN) 5 MG tablet   Oral   Take 3 tablets (15 mg total) by mouth at bedtime.   90 tablet   0   . fluPHENAZine decanoate (PROLIXIN) 25 MG/ML injection   Intramuscular   Inject 1 mL (25 mg total) into the muscle every 14 (fourteen) days.   5 mL   0     Due on 9/22   . lisinopril (PRINIVIL,ZESTRIL) 20 MG tablet   Oral   Take 1 tablet (20 mg total) by mouth every morning.   30 tablet   0   . LORazepam (ATIVAN) 0.5 MG  tablet   Oral   Take 0.5 mg by mouth 3 (three) times daily.          . metoCLOPramide (REGLAN) 10 MG tablet   Oral   Take 1 tablet (10 mg total) by mouth every 6 (six) hours as needed for nausea or vomiting.   12 tablet   1   . Mometasone Furo-Formoterol Fum (DULERA IN)   Inhalation  Inhale 2 puffs into the lungs 2 (two) times daily.         Marland Kitchen PRESCRIPTION MEDICATION   Topical   Apply 1 application topically 2 (two) times daily. Triamcinolone cream         . insulin glargine (LANTUS) 100 UNIT/ML injection   Subcutaneous   Inject 0.15 mLs (15 Units total) into the skin at bedtime. Patient not taking: Reported on 12/23/2014   10 mL   2   . tiotropium (SPIRIVA) 18 MCG inhalation capsule   Inhalation   Place 1 capsule (18 mcg total) into inhaler and inhale daily. Patient not taking: Reported on 12/23/2014   30 capsule   12   . traMADol (ULTRAM) 50 MG tablet   Oral   Take 1 tablet (50 mg total) by mouth every 6 (six) hours as needed. Patient not taking: Reported on 12/23/2014   20 tablet   0     Allergies Haldol; Metformin; and Raspberry  Family History  Problem Relation Age of Onset  . Colon cancer Neg Hx   . Liver disease Neg Hx   . Heart attack Mother     Social History Social History  Substance Use Topics  . Smoking status: Former Research scientist (life sciences)  . Smokeless tobacco: Never Used  . Alcohol Use: No     Comment: occ.    Review of Systems  Constitutional: Negative for fever. ENT: Negative for sore throat. Cardiovascular: Negative for chest pain. Respiratory: Negative for cough. Gastrointestinal: Negative for abdominal pain, vomiting and diarrhea. Genitourinary: Negative for dysuria. Musculoskeletal: Diffuse myalgias.. Skin: Negative for rash. Neurological: Diffuse pain with general weakness   10-point ROS otherwise negative.  ____________________________________________   PHYSICAL EXAM:  VITAL SIGNS: ED Triage Vitals  Enc Vitals Group      BP 12/23/14 0640 175/120 mmHg     Pulse Rate 12/23/14 0640 85     Resp --      Temp 12/23/14 0640 97.8 F (36.6 C)     Temp Source 12/23/14 0640 Oral     SpO2 12/23/14 0638 100 %     Weight 12/23/14 0640 273 lb (123.832 kg)     Height 12/23/14 0640 5\' 6"  (1.676 m)     Head Cir --      Peak Flow --      Pain Score 12/23/14 0650 8     Pain Loc --      Pain Edu? --      Excl. in [place]? --     Constitutional: Alert, communicative but tangential. Upset.  ENT   Head: Normocephalic and atraumatic.   Nose: No congestion/rhinnorhea.    Neck: Minimal discomfort in the trapezius. No midline tenderness. Cardiovascular: Normal rate, regular rhythm, no murmur noted Respiratory:  Normal respiratory effort, no tachypnea.    Breath sounds are clear and equal bilaterally.  Gastrointestinal: Soft and nontender. No distention.  Back: No muscle spasm, no tenderness, no CVA tenderness. Musculoskeletal: No deformity noted. Patient appears very sensitive to touch on her arms. When I gently laid her hand on her arm to provide reassurance, she had notable discomfort and asked for me to please not touch her. This appeared to be bilateral in the arms. When I touch her legs during the interview I did not the same response. Neurologic: Patient is somewhat upset and tangential but communicative. She is able to wiggle her fingers and hands and feet when I ask her to. When I asked her to raise her arms she says  she can't, however during our subsequently, during our conversation, she Raises Both Arms Independently.  Skin:  Skin is warm, dry. No rash noted. Psychiatric: Tearful, upset, slightly agitated, tangential.  ____________________________________________    LABS (pertinent positives/negatives)  Labs Reviewed  CBC WITH DIFFERENTIAL/PLATELET - Abnormal; Notable for the following:    Monocytes Absolute 1.3 (*)    All other components within normal limits  COMPREHENSIVE METABOLIC PANEL - Abnormal; Notable  for the following:    Glucose, Bld 164 (*)    All other components within normal limits  CK - Abnormal; Notable for the following:    Total CK 743 (*)    All other components within normal limits  URINALYSIS COMPLETEWITH MICROSCOPIC ([facility] ONLY) - Abnormal; Notable for the following:    Color, Urine YELLOW (*)    APPearance HAZY (*)    Ketones, ur TRACE (*)    Protein, ur 30 (*)    Squamous Epithelial / LPF 0-5 (*)    All other components within normal limits  SEDIMENTATION RATE  C-REACTIVE PROTEIN     ____________________________________________   EKG  ED ECG REPORT I, Francis Yardley W, the attending physician, personally viewed and interpreted this ECG.   Date: 12/23/2014  EKG Time: 8:14 AM  Rate: 73  Rhythm: Normal sinus rhythm  Axis: Normal  Intervals: Normal  ST&T Change: Downward T waves in lead 1, 2, 3, and aVF, V3 through V6. Unchanged from 12/21/2014   ____________________________________________    RADIOLOGY    ____________________________________________   PROCEDURES    ____________________________________________   INITIAL IMPRESSION / ASSESSMENT AND PLAN / ED COURSE  Pertinent labs & imaging results that were available during my care of the patient were reviewed by me and considered in my medical decision making (see chart for details).  This patient is a frequent visitor to the emergency department. It is a little bit difficult to gauge how much of this may be social or psychiatric versus organic. As noted above in the history and physical, she has notable tenderness in both arms. This may be a rheumatologic disorder, it is unclear. I am adding a CK level as well as checking a sedimentation rate.  Given the patient's frequent utilization of the emergency department and apparent difficulty navigating her health care needs and being unhappy in her current housing situation, I have asked for a social work consult in order to better build a social  treatment plan for her.  ----------------------------------------- 12:04 PM on 12/23/2014 -----------------------------------------  Social work consult is still pending. CK level is a little bit elevated at 743. Patient has good renal function. Sedimentation rate is negative.  ----------------------------------------- 3:48 PM on 12/23/2014 -----------------------------------------  Social work consult is still pending. I will last my colleague, Dr. Archie Balboa, to follow-up on their recommendations. I do believe the patient is medically stable and able to be discharged, given an appropriate disposition.  ____________________________________________   FINAL CLINICAL IMPRESSION(S) / ED DIAGNOSES  Final diagnoses:  Schizoaffective disorder, bipolar type with good prognostic features [facility])  Myalgia       Ahmed Prima, MD 12/23/14 [phone]

## 2014-12-23 NOTE — Discharge Instructions (Signed)
You do not have any identifiable fractures for acute injuries. Please follow-up with your regular doctor. We are here for you in any emergency situation, but we feel as though we are not able to provide you with the services that you currently need for your regular medical care. Return for urgent concerns.  Muscle Pain, Adult Muscle pain (myalgia) may be caused by many things, including:  Overuse or muscle strain, especially if you are not in shape. This is the most common cause of muscle pain.  Injury.  Bruises.  Viruses, such as the flu.  Infectious diseases.  Fibromyalgia, which is a chronic condition that causes muscle tenderness, fatigue, and headache.  Autoimmune diseases, including lupus.  Certain drugs, including ACE inhibitors and statins. Muscle pain may be mild or severe. In most cases, the pain lasts only a short time and goes away without treatment. To diagnose the cause of your muscle pain, your health care provider will take your medical history. This means he or she will ask you when your muscle pain began and what has been happening. If you have not had muscle pain for very long, your health care provider may want to wait before doing much testing. If your muscle pain has lasted a long time, your health care provider may want to run tests right away. If your health care provider thinks your muscle pain may be caused by illness, you may need to have additional tests to rule out certain conditions.  Treatment for muscle pain depends on the cause. Home care is often enough to relieve muscle pain. Your health care provider may also prescribe anti-inflammatory medicine. HOME CARE INSTRUCTIONS Watch your condition for any changes. The following actions may help to lessen any discomfort you are feeling:  Only take over-the-counter or prescription medicines as directed by your health care provider.  Apply ice to the sore muscle:  Put ice in a plastic bag.  Place a towel  between your skin and the bag.  Leave the ice on for 15-20 minutes, 3-4 times a day.  You may alternate applying hot and cold packs to the muscle as directed by your health care provider.  If overuse is causing your muscle pain, slow down your activities until the pain goes away.  Remember that it is normal to feel some muscle pain after starting a workout program. Muscles that have not been used often will be sore at first.  Do regular, gentle exercises if you are not usually active.  Warm up before exercising to lower your risk of muscle pain.  Do not continue working out if the pain is very bad. Bad pain could mean you have injured a muscle. SEEK MEDICAL CARE IF:  Your muscle pain gets worse, and medicines do not help.  You have muscle pain that lasts longer than 3 days.  You have a rash or fever along with muscle pain.  You have muscle pain after a tick bite.  You have muscle pain while working out, even though you are in good physical condition.  You have redness, soreness, or swelling along with muscle pain.  You have muscle pain after starting a new medicine or changing the dose of a medicine. SEEK IMMEDIATE MEDICAL CARE IF:  You have trouble breathing.  You have trouble swallowing.  You have muscle pain along with a stiff neck, fever, and vomiting.  You have severe muscle weakness or cannot move part of your body. MAKE SURE YOU:   Understand these instructions.  Will watch your condition.  Will get help right away if you are not doing well or get worse.   This information is not intended to replace advice given to you by your health care provider. Make sure you discuss any questions you have with your health care provider.   Document Released: 12/31/2005 Document Revised: 03/01/2014 Document Reviewed: 12/05/2012 Elsevier Interactive Patient Education [employer].

## 2014-12-23 NOTE — ED Notes (Signed)
Pt crying, stating she shouldn't have to go back to the facility, very angry, stating we have done nothing for her all day.

## 2014-12-23 NOTE — ED Notes (Signed)
Turkey sandwich given to pt per request.

## 2014-12-24 ENCOUNTER — Encounter: Payer: Self-pay | Admitting: Emergency Medicine

## 2014-12-24 ENCOUNTER — Emergency Department
Admission: EM | Admit: 2014-12-24 | Discharge: 2015-01-08 | Disposition: A | Discharge status: Other Acute Inpatient Hospital | Payer: Medicare HMO | Attending: Emergency Medicine | Admitting: Emergency Medicine

## 2014-12-24 ENCOUNTER — Other Ambulatory Visit: Payer: Self-pay

## 2014-12-24 DIAGNOSIS — Z794 Long term (current) use of insulin: Secondary | ICD-10-CM | POA: Insufficient documentation

## 2014-12-24 DIAGNOSIS — M79669 Pain in unspecified lower leg: Secondary | ICD-10-CM | POA: Diagnosis not present

## 2014-12-24 DIAGNOSIS — F329 Major depressive disorder, single episode, unspecified: Secondary | ICD-10-CM | POA: Diagnosis not present

## 2014-12-24 DIAGNOSIS — M79629 Pain in unspecified upper arm: Secondary | ICD-10-CM | POA: Diagnosis not present

## 2014-12-24 DIAGNOSIS — F1310 Sedative, hypnotic or anxiolytic abuse, uncomplicated: Secondary | ICD-10-CM | POA: Insufficient documentation

## 2014-12-24 DIAGNOSIS — I129 Hypertensive chronic kidney disease with stage 1 through stage 4 chronic kidney disease, or unspecified chronic kidney disease: Secondary | ICD-10-CM | POA: Insufficient documentation

## 2014-12-24 DIAGNOSIS — F250 Schizoaffective disorder, bipolar type: Secondary | ICD-10-CM | POA: Diagnosis not present

## 2014-12-24 DIAGNOSIS — Z79899 Other long term (current) drug therapy: Secondary | ICD-10-CM | POA: Insufficient documentation

## 2014-12-24 DIAGNOSIS — F209 Schizophrenia, unspecified: Secondary | ICD-10-CM | POA: Insufficient documentation

## 2014-12-24 DIAGNOSIS — Z87891 Personal history of nicotine dependence: Secondary | ICD-10-CM | POA: Insufficient documentation

## 2014-12-24 DIAGNOSIS — E119 Type 2 diabetes mellitus without complications: Secondary | ICD-10-CM | POA: Insufficient documentation

## 2014-12-24 DIAGNOSIS — Z7951 Long term (current) use of inhaled steroids: Secondary | ICD-10-CM | POA: Diagnosis not present

## 2014-12-24 DIAGNOSIS — Z046 Encounter for general psychiatric examination, requested by authority: Secondary | ICD-10-CM | POA: Diagnosis present

## 2014-12-24 DIAGNOSIS — X838XXA Intentional self-harm by other specified means, initial encounter: Secondary | ICD-10-CM | POA: Insufficient documentation

## 2014-12-24 DIAGNOSIS — M79606 Pain in leg, unspecified: Secondary | ICD-10-CM

## 2014-12-24 DIAGNOSIS — F32A Depression, unspecified: Secondary | ICD-10-CM

## 2014-12-24 DIAGNOSIS — N189 Chronic kidney disease, unspecified: Secondary | ICD-10-CM | POA: Diagnosis not present

## 2014-12-24 DIAGNOSIS — M79603 Pain in arm, unspecified: Secondary | ICD-10-CM

## 2014-12-24 LAB — COMPREHENSIVE METABOLIC PANEL
ALBUMIN: 3.7 g/dL (ref 3.5–5.0)
ALK PHOS: 67 U/L (ref 38–126)
ALT: 26 U/L (ref 14–54)
ANION GAP: 7 (ref 5–15)
AST: 38 U/L (ref 15–41)
BILIRUBIN TOTAL: 0.5 mg/dL (ref 0.3–1.2)
BUN: 12 mg/dL (ref 6–20)
CALCIUM: 9 mg/dL (ref 8.9–10.3)
CO2: 29 mmol/L (ref 22–32)
Chloride: 103 mmol/L (ref 101–111)
Creatinine, Ser: 0.76 mg/dL (ref 0.44–1.00)
GFR calc Af Amer: >60 mL/min (ref >60)
GLUCOSE: 152 mg/dL — AB (ref 65–99)
POTASSIUM: 3.5 mmol/L (ref 3.5–5.1)
Sodium: 139 mmol/L (ref 135–145)
TOTAL PROTEIN: 7.1 g/dL (ref 6.5–8.1)

## 2014-12-24 LAB — URINE DRUG SCREEN, QUALITATIVE (ARMC ONLY)
Amphetamines, Ur Screen: NOT DETECTED
BARBITURATES, UR SCREEN: NOT DETECTED
BENZODIAZEPINE, UR SCRN: POSITIVE — AB
CANNABINOID 50 NG, UR ~~LOC~~: NOT DETECTED
Cocaine Metabolite,Ur ~~LOC~~: NOT DETECTED
MDMA (Ecstasy)Ur Screen: NOT DETECTED
Methadone Scn, Ur: NOT DETECTED
Opiate, Ur Screen: NOT DETECTED
PHENCYCLIDINE (PCP) UR S: NOT DETECTED
Tricyclic, Ur Screen: NOT DETECTED

## 2014-12-24 LAB — ACETAMINOPHEN LEVEL: Acetaminophen (Tylenol), Serum: <10 ug/mL — ABNORMAL LOW (ref 10–30)

## 2014-12-24 LAB — CBC
HCT: 41.5 % (ref 35.0–47.0)
Hemoglobin: 13.5 g/dL (ref 12.0–16.0)
MCH: 27.5 pg (ref 26.0–34.0)
MCHC: 32.5 g/dL (ref 32.0–36.0)
MCV: 84.7 fL (ref 80.0–100.0)
PLATELETS: 219 10*3/uL (ref 150–440)
RBC: 4.9 MIL/uL (ref 3.80–5.20)
RDW: 14.3 % (ref 11.5–14.5)
WBC: 12.4 10*3/uL — AB (ref 3.6–11.0)

## 2014-12-24 LAB — SALICYLATE LEVEL: Salicylate Lvl: <4 mg/dL (ref 2.8–30.0)

## 2014-12-24 LAB — ETHANOL

## 2014-12-24 NOTE — Consult Note (Signed)
  Psychiatry: Initial note on this 69 year old woman well known to me and to the psychiatry service with a long history of schizoaffective disorder who presents today under IVC from her group home. They are alleging that she ran out into the street. She is alleging that they are lying about her. Patient is her usual somewhat labile self. I anticipate this is probably going to be a social work intervention as much is anything else given how Ms. Scheibel really rarely changes as long as she is on her medicine and always requires a lot of close attention. No change to current medicine. Case reviewed with emergency room doctor. We will follow-up with the full social work Marketing executive.

## 2014-12-24 NOTE — ED Notes (Signed)
This nurse spoke with son, [name].  Son has concerns if patient is discharge "that this will just keep happening and she'll be right back here".  Son wants patient to be admitted to [name] where she has had success in the past.  Son gave phone number for Kennith Maes at [facility] at [phone].  This nurse notified TTS, Jarrett Soho, of this information.  [name] [phone]

## 2014-12-24 NOTE — ED Provider Notes (Signed)
[facility] Emergency Department Provider Note  ____________________________________________  Time seen: Approximately 3:35 PM  I have reviewed the triage vital signs and the nursing notes.   HISTORY  Chief Complaint IVC     HPI [name] [name] is a 69 y.o. female who lives in a group home with a history of schizoaffective disorder, bipolar presenting for standing in traffic and throwing herself down the stairs. The patient reports "they are being mean to me." She denies having stood in traffic and said "they are lying, just like yesterday." The patient denies any SI, HI, or hallucinations. She states that she has pain in both her arms and legs; this is a chronic pain and unchanged from previous visits. The patient denies any other recent medical problems.   Past Medical History  Diagnosis Date  . DM2 (diabetes mellitus, type 2) ([place])   . Hypertension   . Hypercholesteremia   . Schizophrenia ([place])   . Osteoarthritis   . Bursitis   . OSA (obstructive sleep apnea)     does not use machine  . Left ventricular outflow tract obstruction     a. echo 03/2014: EF 60-65%, hypernamic LV systolic fxn, mod LVH w/ LVOT gradient estimated at 68 mm Hg w/ valsalva, very small LV internal cavity size, mildly increased LV posterior wall thickness, mild Ao valve scl w/o stenosis, diastolic dysfunction, normal RVSP  . LVH (left ventricular hypertrophy)     a. echo suggests long standing uncontrolled htn. she will not do well when dehydrated, LV cavity obliteration  . Obesity   . CHF (congestive heart failure) ([place])   . Anxiety   . COPD (chronic obstructive pulmonary disease) ([place])   . Bronchitis   . CAD (coronary artery disease)   . Tremors of nervous system   . GERD (gastroesophageal reflux disease)   . Environmental allergies   . Arthritis   . Depression   . Chronic kidney disease     shadow on x-ray  . Muscle weakness   . Chronic cough   . Lower extremity edema    . Wheezing   . On supplemental oxygen therapy     AS NEEDED    Patient Active Problem List   Diagnosis Date Noted  . Schizoaffective disorder ([place]) 12/23/2014  . Schizoaffective disorder, bipolar type with good prognostic features ([place]) 10/30/2014  . Urinary incontinence 10/30/2014  . GERD (gastroesophageal reflux disease) 10/30/2014  . OSA (obstructive sleep apnea) 10/30/2014  . Osteoarthrosis, unspecified whether generalized or localized, involving lower leg 10/30/2014  . COPD (chronic obstructive pulmonary disease) ([place]) 10/30/2014  . Chronic diastolic CHF (congestive heart failure) ([place]) 05/15/2014  . Obesity   . History of colon polyps 03/09/2013  . Renal mass 06/26/2011  . Lytic bone lesion of hip 06/25/2011  . Hypertension 06/24/2011  . Diabetes mellitus ([place]) 06/24/2011    Past Surgical History  Procedure Laterality Date  . Tonsillectomy    . Knee reconstruction, medial patellar femoral ligament    . Cholecystectomy    . Colonoscopy  04/2011    [facility] per patient incomplete  . Tonsillectomy    . Cataract extraction w/phaco Left 09/10/2014    Procedure: CATARACT EXTRACTION PHACO AND INTRAOCULAR LENS PLACEMENT (IOC);  Surgeon: Birder Robson, MD;  Location: [facility] ORS;  Service: Ophthalmology;  Laterality: Left;  [place]: 00:44 AP%: 22.8 CDE: 10.24 Fluid lot #[id number]  . Joint replacement      TKR  . Eye surgery    . Cataract extraction w/phaco  Right 10/15/2014    Procedure: CATARACT EXTRACTION PHACO AND INTRAOCULAR LENS PLACEMENT (IOC);  Surgeon: Birder Robson, MD;  Location: [facility] ORS;  Service: Ophthalmology;  Laterality: Right;  [place]: 00:52 AP:40.1 CDE:11.83 LOT FUXN #[id number]    Current Outpatient Rx  Name  Route  Sig  Dispense  Refill  . benztropine (COGENTIN) 0.5 MG tablet   Oral   Take 1 tablet (0.5 mg total) by mouth 2 (two) times daily.   60 tablet   0   . citalopram (CELEXA) 10 MG tablet   Oral   Take 1 tablet by mouth at bedtime.          .  dicyclomine (BENTYL) 20 MG tablet   Oral   Take 1 tablet (20 mg total) by mouth 3 (three) times daily as needed for spasms.   30 tablet   0   . Difluprednate (DUREZOL) 0.05 % EMUL   Both Eyes   Place 1 drop into both eyes daily.          Marland Kitchen docusate sodium (COLACE) 100 MG capsule   Oral   Take 2 capsules (200 mg total) by mouth 2 (two) times daily.   120 capsule   0   . fluPHENAZine (PROLIXIN) 5 MG tablet   Oral   Take 3 tablets (15 mg total) by mouth at bedtime.   90 tablet   0   . fluPHENAZine decanoate (PROLIXIN) 25 MG/ML injection   Intramuscular   Inject 1 mL (25 mg total) into the muscle every 14 (fourteen) days.   5 mL   0     Due on 9/22   . insulin glargine (LANTUS) 100 UNIT/ML injection   Subcutaneous   Inject 0.15 mLs (15 Units total) into the skin at bedtime. Patient not taking: Reported on 12/23/2014   10 mL   2   . lisinopril (PRINIVIL,ZESTRIL) 20 MG tablet   Oral   Take 1 tablet (20 mg total) by mouth every morning.   30 tablet   0   . LORazepam (ATIVAN) 0.5 MG tablet   Oral   Take 0.5 mg by mouth 3 (three) times daily.          . metoCLOPramide (REGLAN) 10 MG tablet   Oral   Take 1 tablet (10 mg total) by mouth every 6 (six) hours as needed for nausea or vomiting.   12 tablet   1   . Mometasone Furo-Formoterol Fum (DULERA IN)   Inhalation   Inhale 2 puffs into the lungs 2 (two) times daily.         Marland Kitchen PRESCRIPTION MEDICATION   Topical   Apply 1 application topically 2 (two) times daily. Triamcinolone cream         . tiotropium (SPIRIVA) 18 MCG inhalation capsule   Inhalation   Place 1 capsule (18 mcg total) into inhaler and inhale daily. Patient not taking: Reported on 12/23/2014   30 capsule   12   . traMADol (ULTRAM) 50 MG tablet   Oral   Take 1 tablet (50 mg total) by mouth every 6 (six) hours as needed. Patient not taking: Reported on 12/23/2014   20 tablet   0     Allergies Haldol; Metformin; and  Raspberry  Family History  Problem Relation Age of Onset  . Colon cancer Neg Hx   . Liver disease Neg Hx   . Heart attack Mother     Social History Social History  Substance Use Topics  .  Smoking status: Former Research scientist (life sciences)  . Smokeless tobacco: Never Used  . Alcohol Use: No     Comment: occ.    Review of Systems Constitutional: No fever/chills. No syncope or lightheadedness. Eyes: No visual changes. No blurred or double vision. ENT: No sore throat. Cardiovascular: Denies chest pain, palpitations. Respiratory: Denies shortness of breath.  No cough. Gastrointestinal: No abdominal pain.  No nausea, no vomiting.  No diarrhea.  No constipation. Genitourinary: Negative for dysuria. Musculoskeletal: Negative for back pain. Skin: Negative for rash. Neurological: Negative for headaches, focal weakness or numbness. Psychiatric:Denies SI, HI, or hallucinations. Does report depression.  10-point ROS otherwise negative.  ____________________________________________   PHYSICAL EXAM:  VITAL SIGNS: ED Triage Vitals  Enc Vitals Group     BP 12/24/14 1506 157/84 mmHg     Pulse Rate 12/24/14 1506 69     Resp 12/24/14 1506 18     Temp 12/24/14 1506 97.5 F (36.4 C)     Temp Source 12/24/14 1506 Oral     SpO2 12/24/14 1506 93 %     Weight 12/24/14 1506 273 lb (123.832 kg)     Height 12/24/14 1506 5\' 6"  (1.676 m)     Head Cir --      Peak Flow --      Pain Score 12/24/14 1506 8     Pain Loc --      Pain Edu? --      Excl. in [place]? --     Constitutional: Patient is alert and oriented and able to answer questions appropriately she is tearful, crying. At this time, she denies any SI or HI. Eyes: Conjunctivae are normal.  EOMI. Head: Atraumatic. Nose: No congestion/rhinnorhea. Mouth/Throat: Mucous membranes are moist.  Neck: No stridor.  Supple.   Cardiovascular: Normal rate, regular rhythm. No murmurs, rubs or gallops.  Respiratory: Normal respiratory effort.  No retractions. Lungs  CTAB.  No wheezes, rales or ronchi. Gastrointestinal: Soft and nontender. No distention. No peritoneal signs. Musculoskeletal: No LE edema. Full range of motion of the bilateral wrists, elbows, shoulders, ankles, knees, hips without significant reproduction of pain. No swollen, or erythematous joints.  Neurologic:  Normal speech and language. No gross focal neurologic deficits are appreciated.  Skin:  Skin is warm, dry and intact. No rash noted. Psychiatric: Mood is depressed with a flat affect.  ____________________________________________   LABS (all labs ordered are listed, but only abnormal results are displayed)  Labs Reviewed  COMPREHENSIVE METABOLIC PANEL  ETHANOL  SALICYLATE LEVEL  ACETAMINOPHEN LEVEL  CBC  URINE DRUG SCREEN, QUALITATIVE ([place])   ____________________________________________  EKG  Not indicated ____________________________________________  RADIOLOGY  No results found.  ____________________________________________   PROCEDURES  Procedure(s) performed: None  Critical Care performed: No ____________________________________________   INITIAL IMPRESSION / ASSESSMENT AND PLAN / ED COURSE  Pertinent labs & imaging results that were available during my care of the patient were reviewed by me and considered in my medical decision making (see chart for details).  69 y.o. with a history of schizoaffective disorder, bipolar disorder sent from her group home because she stood in traffic. She is denying this. At this time, the patient states that she does not have SI or HI however she is emotionally distraught and tearful on my exam. She will need a full psychiatric evaluation. Her medical clearance will include basic labs. She did have the same symptoms of arm and leg pain yesterday with a minimally elevated CK but has not had any new trauma or  crush injury, nor do I see any evidence of infection or swollen joints.  It is unlikely that her pain is from a  new traumatic injury or infectious in etiology. She'll need to be followed up by primary care physician.  ____________________________________________  FINAL CLINICAL IMPRESSION(S) / ED DIAGNOSES  Final diagnoses:  None      NEW MEDICATIONS STARTED DURING THIS VISIT:  New Prescriptions   No medications on file     Eula Listen, MD 12/24/14 1610

## 2014-12-24 NOTE — BH Assessment (Signed)
Assessment Note  [name] [name] is an 69 y.o. female. who presents to [facility] ED after her, group home, petitioned for Pt's involuntary commitment. Affidavit and petition states that the Pt walked out in front of traffic today.  Pt reports she is unhappy with her current living situation at the group home.  Pt states they are "selling drugs." Pt was paranoid about multiple people in her life selling drugs from the group home leader, employees, and to her son's friends. Pt reports she did walk to the neighbors house today in attempts to call her son because they don't let her do anything at the group home. Pt denies walking out in front of traffic and states "I grew up in [place] and I know how to cross the street."   Pt reports she has a history of schizophrenia. She denies any recent symptoms.  Pt denies any symptoms of suicidal ideation.   Pt denies any self injurious behaviors. Pt denies homicidal ideation or history of violence. Pt denies any history of auditory or visual hallucinations, however she was hyper religous and paranoid about people selling drugs. Pt denies access to firearms or other weapons. Pt denies any current substance use and/or abuse.     Pt lives at [facility] (([phone]). Writer was unable to reach anyone at the group home.  Pt denies any physical, verbal, sexual abuse currently or as a child. Pt denies legal problems.  Pt is dressed in hospital scrubs, alert, oriented x4 with overly loud speech and normal motor behavior. Eye contact is fair. Pt's mood is pleasant and affect is happy. Pt was pleasant and cooperative throughout assessment.   Pt has an ACT Team and a guardian [name], son)    Diagnosis:   Past Medical History:  Past Medical History  Diagnosis Date  . DM2 (diabetes mellitus, type 2) ([place])   . Hypertension   . Hypercholesteremia   . Schizophrenia ([place])   . Osteoarthritis   . Bursitis   . OSA (obstructive sleep apnea)      does not use machine  . Left ventricular outflow tract obstruction     a. echo 03/2014: EF 60-65%, hypernamic LV systolic fxn, mod LVH w/ LVOT gradient estimated at 68 mm Hg w/ valsalva, very small LV internal cavity size, mildly increased LV posterior wall thickness, mild Ao valve scl w/o stenosis, diastolic dysfunction, normal RVSP  . LVH (left ventricular hypertrophy)     a. echo suggests long standing uncontrolled htn. she will not do well when dehydrated, LV cavity obliteration  . Obesity   . CHF (congestive heart failure) ([place])   . Anxiety   . COPD (chronic obstructive pulmonary disease) ([place])   . Bronchitis   . CAD (coronary artery disease)   . Tremors of nervous system   . GERD (gastroesophageal reflux disease)   . Environmental allergies   . Arthritis   . Depression   . Chronic kidney disease     shadow on x-ray  . Muscle weakness   . Chronic cough   . Lower extremity edema   . Wheezing   . On supplemental oxygen therapy     AS NEEDED    Past Surgical History  Procedure Laterality Date  . Tonsillectomy    . Knee reconstruction, medial patellar femoral ligament    . Cholecystectomy    . Colonoscopy  04/2011    [facility] per patient incomplete  . Tonsillectomy    . Cataract extraction w/phaco Left  09/10/2014    Procedure: CATARACT EXTRACTION PHACO AND INTRAOCULAR LENS PLACEMENT (IOC);  Surgeon: Birder Robson, MD;  Location: [facility] ORS;  Service: Ophthalmology;  Laterality: Left;  [place]: 00:44 AP%: 22.8 CDE: 10.24 Fluid lot #[id number]  . Joint replacement      TKR  . Eye surgery    . Cataract extraction w/phaco Right 10/15/2014    Procedure: CATARACT EXTRACTION PHACO AND INTRAOCULAR LENS PLACEMENT (IOC);  Surgeon: Birder Robson, MD;  Location: [facility] ORS;  Service: Ophthalmology;  Laterality: Right;  [place]: 00:52 AP:40.1 CDE:11.83 LOT ZSWF #[id number] H    Family History:  Family History  Problem Relation Age of Onset  . Colon cancer Neg Hx   . Liver disease Neg Hx   . Heart  attack Mother     Social History:  reports that she has quit smoking. She has never used smokeless tobacco. She reports that she does not drink alcohol or use illicit drugs.  Additional Social History:  Alcohol / Drug Use Pain Medications: None reported Prescriptions: None reported Over the Counter: None  reported  CIWA: CIWA-Ar BP: (!) 157/84 mmHg Pulse Rate: 69 COWS:    Allergies:  Allergies  Allergen Reactions  . Haldol [Haloperidol Decanoate] Swelling and Other (See Comments)    Tongue swells and blurred vision  . Metformin Diarrhea  . Raspberry Swelling    Other reaction(s): SWELLINGin lips    Home Medications:  (Not in a hospital admission)  OB/GYN Status:  No LMP recorded. Patient is postmenopausal.  General Assessment Data Location of Assessment: [facility] ED TTS Assessment: In system Is this a Tele or Face-to-Face Assessment?: Face-to-Face Is this an Initial Assessment or a Re-assessment for this encounter?: Initial Assessment Marital status: Single Is patient pregnant?: No Pregnancy Status: No Living Arrangements: Group Home Admission Status: Involuntary Is patient capable of signing voluntary admission?: No Referral Source: Other (ACT Team, [place]) Insurance type: Medicare     Crisis Care Plan Living Arrangements: Group Home Name of Psychiatrist: [facility] ([facility] team) Name of Therapist: [facility]  Education Status Is patient currently in school?: No Current Grade: n/a Highest grade of school patient has completed: 12 Contact person: [name] [phone] (Son) - Guardian  Risk to self with the past 6 months Suicidal Ideation: No Has patient been a risk to self within the past 6 months prior to admission? : No Suicidal Intent: No Has patient had any suicidal intent within the past 6 months prior to admission? : No Is patient at risk for suicide?: No Suicidal Plan?: No Has patient had any suicidal plan  within the past 6 months prior to admission? : No Access to Means: No What has been your use of drugs/alcohol within the last 12 months?: None reported Previous Attempts/Gestures: No How many times?: 0 Other Self Harm Risks: None reported Triggers for Past Attempts: None known Intentional Self Injurious Behavior: None Family Suicide History: No Recent stressful life event(s): Other (Comment) (Pt is unhappy with her current living situation) Persecutory voices/beliefs?: Yes Depression: No Depression Symptoms:  (None) Substance abuse history and/or treatment for substance abuse?: No Suicide prevention information given to non-admitted patients: Not applicable  Risk to Others within the past 6 months Homicidal Ideation: No Does patient have any lifetime risk of violence toward others beyond the six months prior to admission? : No Thoughts of Harm to Others: No Current Homicidal Intent: No Current Homicidal Plan: No Access to Homicidal Means: No Identified Victim: none reported History of harm to others?: No Assessment  of Violence: None Noted Violent Behavior Description: None reported Does patient have access to weapons?: No Criminal Charges Pending?: No Does patient have a court date: No Is patient on probation?: No  Psychosis Hallucinations: None noted Delusions: None noted  Mental Status Report Appearance/Hygiene: In scrubs, Unremarkable Eye Contact: Fair Motor Activity: Freedom of movement Speech: Loud Level of Consciousness: Alert Mood: Pleasant Affect:  (Happy) Anxiety Level: None Thought Processes: Flight of Ideas Judgement: Partial Orientation: Person, Place, Time, Situation Obsessive Compulsive Thoughts/Behaviors: Minimal  Cognitive Functioning Concentration: Fair Memory: Unable to Assess IQ: Average Insight: Fair Impulse Control: Fair Appetite: Good Weight Loss: 0 Weight Gain: 0 Sleep: No Change Total Hours of Sleep: 12 Vegetative Symptoms:  None  ADLScreening [facility]) Patient's cognitive ability adequate to safely complete daily activities?: Yes Patient able to express need for assistance with ADLs?: Yes Independently performs ADLs?: Yes (appropriate for developmental age)  Prior Inpatient Therapy Prior Inpatient Therapy: Yes Prior Therapy Dates: Unknown Prior Therapy Facilty/Provider(s): Unknown Reason for Treatment: Schizophrenia  Prior Outpatient Therapy Prior Outpatient Therapy: Yes Prior Therapy Dates: Current Prior Therapy Facilty[facility]Provider([facility]): [facility] Reason for Treatment: Schizophrenia Does patient have an ACCT team?: Yes Does patient have Intensive In-House Services?  : No Does patient have [facility] services? : No Does patient have P4CC services?: No  ADL Screening (condition at time of admission) Patient's cognitive ability adequate to safely complete daily activities?: Yes Patient able to express need for assistance with ADLs?: Yes Independently performs ADLs?: Yes (appropriate for developmental age)       Abuse/Neglect Assessment (Assessment to be complete while patient is alone) Physical Abuse: Denies Verbal Abuse: Denies Sexual Abuse: Denies Exploitation of patient/patient's resources: Denies Self-Neglect: Denies Values / Beliefs Cultural Requests During Hospitalization: None Spiritual Requests During Hospitalization: None        Additional Information 1:1 In Past 12 Months?: No CIRT Risk: No Elopement Risk: No Does patient have medical clearance?: Yes     Disposition:  Disposition Initial Assessment Completed for this Encounter: Yes Disposition of Patient: Other dispositions Other disposition(s):  (Psych MD Consult)  On Site Evaluation by:   Reviewed with Physician:    Kara Mead Debi Cousin 12/24/2014 4:50 PM

## 2014-12-24 NOTE — ED Notes (Signed)
Pt to ED accompanied by [facility] with IVC papers, pt is from group home, IVC papers state pt has hx of schizophrenia and bipolar disorder and not taking meds, states pt left group home today and stood in traffic and threw self down stairs, pt denies any SI or HI at this time

## 2014-12-25 ENCOUNTER — Emergency Department: Payer: Medicare HMO

## 2014-12-25 DIAGNOSIS — F329 Major depressive disorder, single episode, unspecified: Secondary | ICD-10-CM | POA: Diagnosis not present

## 2014-12-25 LAB — TROPONIN I: Troponin I: <0.03 ng/mL (ref <0.031)

## 2014-12-25 MED ORDER — DOCUSATE SODIUM 100 MG PO CAPS
100.0000 mg | ORAL_CAPSULE | Freq: Once | ORAL | Status: AC
  Administered 2014-12-25: 100 mg via ORAL
  Filled 2014-12-25: qty 1

## 2014-12-25 MED ORDER — LORAZEPAM 2 MG/ML IJ SOLN
INTRAMUSCULAR | Status: AC
  Administered 2014-12-25: 2 mg via INTRAMUSCULAR
  Filled 2014-12-25: qty 1

## 2014-12-25 MED ORDER — LORAZEPAM 2 MG/ML IJ SOLN
2.0000 mg | Freq: Once | INTRAMUSCULAR | Status: AC
  Administered 2014-12-25: 2 mg via INTRAMUSCULAR

## 2014-12-25 NOTE — Progress Notes (Signed)
[place] given [place] 1200

## 2014-12-25 NOTE — ED Notes (Signed)
BEHAVIORAL HEALTH ROUNDING Patient sleeping: Yes.   Patient alert and oriented: sleeping Behavior appropriate: Yes.  ; If no, describe: sleeping Nutrition and fluids offered: sleeping Toileting and hygiene offered: sleeping Sitter present: no Law enforcement present: Yes  and ODS 

## 2014-12-25 NOTE — ED Notes (Signed)
Patient transported to X-ray 

## 2014-12-25 NOTE — ED Notes (Signed)
BEHAVIORAL HEALTH ROUNDING Patient sleeping: Yes.   Patient alert and oriented: not applicable Behavior appropriate: Yes.  ; If no, describe:  Nutrition and fluids offered: Yes  Toileting and hygiene offered: Yes  Sitter present: not applicable Law enforcement present: Yes  

## 2014-12-25 NOTE — ED Notes (Signed)
BEHAVIORAL HEALTH ROUNDING Patient sleeping: No. Patient alert and oriented: yes Behavior appropriate: Yes.  ; If no, describe:  Nutrition and fluids offered: Yes  Toileting and hygiene offered: Yes  Sitter present: not applicable Law enforcement present: Yes  

## 2014-12-25 NOTE — ED Notes (Signed)
BEHAVIORAL HEALTH ROUNDING Patient sleeping: No. Patient alert and oriented: yes Behavior appropriate: Yes.  ; If no, describe:  Nutrition and fluids offered: Yes  Toileting and hygiene offered: Yes  Sitter present: Law enforcement present: Yes

## 2014-12-25 NOTE — ED Notes (Signed)
ED BHU [place] Is the patient under IVC or is there intent for IVC: Yes.   Is the patient medically cleared: Yes.   Is there vacancy in the ED BHU: Yes.   Is the population mix appropriate for patient: No. Is the patient awaiting placement in inpatient or outpatient setting: Yes.   Has the patient had a psychiatric consult: Yes.   Survey of unit performed for contraband, proper placement and condition of furniture, tampering with fixtures in bathroom, shower, and each patient room: Yes.  ; Findings:  APPEARANCE/BEHAVIOR calm NEURO ASSESSMENT Orientation: time, person, place Hallucinations: No.None noted (Hallucinations) Speech: Normal Gait: unsteady RESPIRATORY ASSESSMENT Normal expansion.  Clear to auscultation.  No rales, rhonchi, or wheezing. CARDIOVASCULAR ASSESSMENT regular rate and rhythm, S1, S2 normal, no murmur, click, rub or gallop GASTROINTESTINAL ASSESSMENT soft, nontender, BS WNL, no r/g EXTREMITIES  PLAN OF CARE Provide calm/safe environment. Vital signs assessed twice daily. ED BHU Assessment once each 12-hour shift. Collaborate with intake RN daily or as condition indicates. Assure the ED provider has rounded once each shift. Provide and encourage hygiene. Provide redirection as needed. Assess for escalating behavior; address immediately and inform ED provider.  Assess family dynamic and appropriateness for visitation as needed: Yes.  ; If necessary, describe findings:  Educate the patient/family about BHU procedures/visitation: Yes.  ; If necessary, describe findings:

## 2014-12-25 NOTE — ED Provider Notes (Signed)
-----------------------------------------   6:34 PM on 12/25/2014 -----------------------------------------   Blood pressure 161/90, pulse 86, temperature 97.5 F (36.4 C), temperature source Oral, resp. rate 15, height 5\' 6"  (1.676 m), weight 273 lb (123.832 kg), SpO2 95 %.  Patient is complaining of chest pain which she says is been ongoing since she got to the emergency department. Denies any radiation. EKG was done and the patient was saying she was "nervous" when was happening and had some tremor.  ED ECG REPORT I, Doran Stabler, the attending physician, personally viewed and interpreted this ECG.   Date: 12/25/2014  EKG Time: 1832  Rate: 92  Rhythm: normal sinus rhythm  Axis: Normal axis  Intervals:none  ST&T Change: No ST segment elevation or depression. No abnormal T-wave inversion. Baseline disturbance likely from the patient shaking.  Reassuring EKG after over 24 hours of chest pain per the patient. We'll continue to observe.    Orbie Pyo, MD 12/25/14 [phone]

## 2014-12-25 NOTE — ED Notes (Signed)
Patient assigned to appropriate care area. Patient oriented to unit/care area: Informed that, for their safety, care areas are designed for safety and monitored by security cameras at all times; and visiting hours explained to patient.

## 2014-12-25 NOTE — ED Notes (Addendum)
BEHAVIORAL HEALTH ROUNDING Patient sleeping: Yes.   Patient alert and oriented: sleeping Behavior appropriate: Yes.  ; If no, describe: sleeping Nutrition and fluids offered: sleeping Toileting and hygiene offered: sleeping Sitter present: no Law enforcement present: Yes  and ODS 

## 2014-12-25 NOTE — Progress Notes (Signed)
   12/25/14 1915  Clinical Encounter Type  Visited With Patient  Visit Type Spiritual support;Follow-up  Referral From Nurse  Consult/Referral To Chaplain  Spiritual Encounters  Spiritual Needs Emotional;Prayer  Patient wanted to read the Bible and have prayer. I sat and listened to her anxieties. I offered her a comforting presence. [place]

## 2014-12-25 NOTE — ED Notes (Signed)

## 2014-12-25 NOTE — ED Notes (Signed)
Received report from andrea b rn, care assumed.  Pt resting in bed at this time.

## 2014-12-25 NOTE — Consult Note (Signed)
  Psychiatry: Follow-up note for this 69 year old woman well known to the psychiatry service currently in the emergency room. [name] [name] has not been a particular problem today. She can be a little boisterous at times but has not been hostile or threatening. When I came to see her this evening she was enjoying spending some time chatting with the pastoral counselor. She has been cooperative with treatment and has not been showing any signs of acute dangerousness.  No indication currently to change her medication plan. [name] has reportedly been accepted to go to the [facility] either tomorrow or within the next couple days. I am staying in touch with social work here about the discharge plan. Meanwhile continue daily supportive therapy and medication management. Vital signs being checked.  No change to diagnosis of schizoaffective disorder

## 2014-12-26 DIAGNOSIS — F329 Major depressive disorder, single episode, unspecified: Secondary | ICD-10-CM | POA: Diagnosis not present

## 2014-12-26 MED ORDER — NON FORMULARY: 100.0000 mg | Freq: Every day | Status: DC

## 2014-12-26 MED ORDER — ALBUTEROL SULFATE (2.5 MG/3ML) 0.083% IN NEBU
2.5000 mg | INHALATION_SOLUTION | Freq: Four times a day (QID) | RESPIRATORY_TRACT | Status: DC | PRN
  Administered 2014-12-28: 2.5 mg via RESPIRATORY_TRACT
  Filled 2014-12-26 (×2): qty 3

## 2014-12-26 MED ORDER — ATORVASTATIN CALCIUM 20 MG PO TABS
20.0000 mg | ORAL_TABLET | Freq: Every day | ORAL | Status: DC
  Administered 2014-12-26 – 2015-01-07 (×13): 20 mg via ORAL
  Filled 2014-12-26 (×6): qty 1

## 2014-12-26 MED ORDER — ACETAMINOPHEN 325 MG PO TABS
ORAL_TABLET | ORAL | Status: AC
  Administered 2014-12-26: 650 mg via ORAL
  Filled 2014-12-26: qty 2

## 2014-12-26 MED ORDER — LINAGLIPTIN 5 MG PO TABS
5.0000 mg | ORAL_TABLET | Freq: Every day | ORAL | Status: DC
Start: 2014-12-26 — End: 2015-01-08
  Administered 2014-12-26 – 2015-01-08 (×14): 5 mg via ORAL
  Filled 2014-12-26 (×6): qty 1

## 2014-12-26 MED ORDER — ALBUTEROL SULFATE HFA 108 (90 BASE) MCG/ACT IN AERS: 2.0000 | INHALATION_SPRAY | Freq: Four times a day (QID) | RESPIRATORY_TRACT | Status: DC | PRN

## 2014-12-26 MED ORDER — DOCUSATE SODIUM 100 MG PO CAPS
100.0000 mg | ORAL_CAPSULE | Freq: Once | ORAL | Status: AC
  Administered 2014-12-26: 100 mg via ORAL
  Filled 2014-12-26: qty 1

## 2014-12-26 MED ORDER — LISINOPRIL 20 MG PO TABS
10.0000 mg | ORAL_TABLET | Freq: Every day | ORAL | Status: DC
  Administered 2014-12-27 – 2015-01-03 (×8): 10 mg via ORAL
  Filled 2014-12-26 (×5): qty 1

## 2014-12-26 MED ORDER — LISINOPRIL 5 MG PO TABS: 5.0000 mg | ORAL_TABLET | Freq: Every day | ORAL | Status: DC

## 2014-12-26 MED ORDER — VERAPAMIL HCL 40 MG PO TABS
40.0000 mg | ORAL_TABLET | Freq: Four times a day (QID) | ORAL | Status: DC
  Administered 2014-12-26 – 2015-01-08 (×42): 40 mg via ORAL
  Filled 2014-12-26 (×27): qty 1

## 2014-12-26 MED ORDER — ACETAMINOPHEN 325 MG PO TABS
650.0000 mg | ORAL_TABLET | Freq: Once | ORAL | Status: AC
  Administered 2014-12-26: 650 mg via ORAL

## 2014-12-26 MED ORDER — MOMETASONE FURO-FORMOTEROL FUM 100-5 MCG/ACT IN AERO
2.0000 | INHALATION_SPRAY | Freq: Two times a day (BID) | RESPIRATORY_TRACT | Status: DC
  Administered 2014-12-26 – 2015-01-08 (×24): 2 via RESPIRATORY_TRACT
  Filled 2014-12-26 (×3): qty 8.8

## 2014-12-26 MED ORDER — ASPIRIN 81 MG PO CHEW
81.0000 mg | CHEWABLE_TABLET | Freq: Once | ORAL | Status: AC
  Administered 2014-12-26: 81 mg via ORAL
  Filled 2014-12-26: qty 1

## 2014-12-26 MED ORDER — FUROSEMIDE 40 MG PO TABS
40.0000 mg | ORAL_TABLET | ORAL | Status: DC
  Administered 2014-12-27 – 2015-01-08 (×6): 40 mg via ORAL
  Filled 2014-12-26: qty 1

## 2014-12-26 MED ORDER — LISINOPRIL 20 MG PO TABS
20.0000 mg | ORAL_TABLET | Freq: Once | ORAL | Status: AC
  Administered 2014-12-26: 20 mg via ORAL
  Filled 2014-12-26: qty 1

## 2014-12-26 MED ORDER — FLUPHENAZINE HCL 5 MG PO TABS
10.0000 mg | ORAL_TABLET | Freq: Every day | ORAL | Status: DC
  Administered 2014-12-26 – 2015-01-07 (×12): 10 mg via ORAL
  Filled 2014-12-26: qty 2
  Filled 2014-12-26: qty 1
  Filled 2014-12-26 (×8): qty 2

## 2014-12-26 MED ORDER — GLIPIZIDE 10 MG PO TABS
10.0000 mg | ORAL_TABLET | Freq: Two times a day (BID) | ORAL | Status: DC
  Administered 2014-12-26 – 2015-01-08 (×27): 10 mg via ORAL
  Filled 2014-12-26 (×13): qty 1

## 2014-12-26 MED ORDER — ACETAMINOPHEN 325 MG PO TABS
650.0000 mg | ORAL_TABLET | Freq: Once | ORAL | Status: AC
  Administered 2014-12-26: 650 mg via ORAL
  Filled 2014-12-26: qty 2

## 2014-12-26 NOTE — ED Notes (Signed)
Resumed care from [facility]; pt requesting laxative

## 2014-12-26 NOTE — ED Notes (Signed)
BEHAVIORAL HEALTH ROUNDING Patient sleeping: Yes.   Patient alert and oriented: not applicable Behavior appropriate: Yes.  ; If no, describe:  Nutrition and fluids offered: No Toileting and hygiene offered: No Sitter present: no Law enforcement present: Yes  

## 2014-12-26 NOTE — Consult Note (Signed)
  Psychiatry: Follow-up for this 69 year old woman with multiple medical problems as well as schizoaffective disorder. Patient interviewed. Chart reviewed. Patient has no complaints today. Says that her mood is feeling good. She has not been violent or threatening here. She still has a labile mood agitated thinking and assertive. Paranoia but is agreeable to the treatment plan. We are hoping that we will be able to discharge her to assisted living as soon as a government pass R number comes through. Patient advised of the plan. No change to medicine today. I do note that her blood pressure is up pretty high so we may need to adjust something with her blood pressure medicine.

## 2014-12-26 NOTE — ED Notes (Signed)
BEHAVIORAL HEALTH ROUNDING Patient sleeping: No. Patient alert and oriented: yes Behavior appropriate: Yes.  ; If no, describe:  Nutrition and fluids offered: Yes  Toileting and hygiene offered: Yes  Sitter present: no Law enforcement present: Yes  

## 2014-12-26 NOTE — ED Notes (Signed)
Pt using telephone  

## 2014-12-26 NOTE — ED Notes (Signed)
Pt anxious and agitated. States that she DOES NOT want to go to [facility] or to [facility]. She only wants to go to the [facility] at [place]. Pt redirected as much as possible and told we are working on her placement and will keep her posted.

## 2014-12-26 NOTE — ED Provider Notes (Signed)
-----------------------------------------   2:46 AM on 12/26/2014 -----------------------------------------   Blood pressure 169/90, pulse 91, temperature 97.5 F (36.4 C), temperature source Oral, resp. rate 14, height 5\' 6"  (1.676 m), weight 273 lb (123.832 kg), SpO2 96 %.  The patient had no acute events since last update.  Calm and cooperative at this time, she is sitting up in chair at the side of her bed in no distress.  Disposition is pending per Psychiatry/Behavioral Medicine team recommendations.     Delman Kitten, MD 12/26/14 [phone]

## 2014-12-26 NOTE — ED Notes (Signed)
Pt given breakfast tray

## 2014-12-27 ENCOUNTER — Other Ambulatory Visit: Payer: Self-pay

## 2014-12-27 DIAGNOSIS — F329 Major depressive disorder, single episode, unspecified: Secondary | ICD-10-CM | POA: Diagnosis not present

## 2014-12-27 MED ORDER — TRAMADOL HCL 50 MG PO TABS
50.0000 mg | ORAL_TABLET | Freq: Two times a day (BID) | ORAL | Status: AC | PRN
  Administered 2014-12-27 – 2015-01-03 (×8): 50 mg via ORAL
  Filled 2014-12-27 (×6): qty 1

## 2014-12-27 MED ORDER — ACETAMINOPHEN 325 MG PO TABS
650.0000 mg | ORAL_TABLET | ORAL | Status: DC | PRN
  Administered 2014-12-27 – 2015-01-06 (×19): 650 mg via ORAL
  Filled 2014-12-27 (×12): qty 2

## 2014-12-27 NOTE — Consult Note (Signed)
  Psychiatry: Follow-up for 70 year old woman with schizoaffective disorder. Patient has no new complaints. She says that she is feeling great. Mood continues to be expressed as good and she is upbeat. She has not been hostile or threatening. Blood pressure is still high.  Social work is working on trying to get the appropriate paperwork in place. Patient is being very patient. We hope that we will be able to get her into a living situation possibly over the weekend or just thereafter. No further change to medication. Supportive counseling completed.

## 2014-12-27 NOTE — ED Notes (Signed)
No new orders

## 2014-12-27 NOTE — ED Notes (Signed)
Pt in room. No complaints or concerns voiced at this time. No abnormal behavior noted at this time. Will continue to monitor with q15 min checks. ODS officer in area. 

## 2014-12-27 NOTE — ED Notes (Signed)
BEHAVIORAL HEALTH ROUNDING Patient sleeping: No. Patient alert and oriented: yes Behavior appropriate: Yes.  ; If no, describe:  Nutrition and fluids offered: Yes  Toileting and hygiene offered: Yes  Sitter present: no Law enforcement present: Yes  

## 2014-12-27 NOTE — ED Notes (Signed)
BEHAVIORAL HEALTH ROUNDING Patient sleeping: No. Patient alert and oriented: yes Behavior appropriate: Yes.   Nutrition and fluids offered: Yes  Toileting and hygiene offered: Yes  Sitter present: q15 min observations Law enforcement present: Yes [employer]  ENVIRONMENTAL ASSESSMENT Potentially harmful objects out of patient reach: Yes.   Personal belongings secured: Yes.   Patient dressed in hospital provided attire only: Yes.   Plastic bags out of patient reach: Yes.   Patient care equipment (cords, cables, call bells, lines, and drains) shortened, removed, or accounted for: Yes.   Equipment and supplies removed from bottom of stretcher: Yes.   Potentially toxic materials out of patient reach: Yes.   Sharps container removed or out of patient reach: Yes  ED BHU [place] Is the patient under IVC or is there intent for IVC: Yes.    Is IVC current? Yes Is the patient medically cleared: Yes.   Is there vacancy in the ED BHU: Yes.   Is the population mix appropriate for patient: No, Pt with shuffling gait, requires assistance with ADLs Is the patient awaiting placement in inpatient or outpatient setting: Yes.   Has the patient had a psychiatric consult: Yes.   Survey of unit performed for contraband, proper placement and condition of furniture, tampering with fixtures in bathroom, shower, and each patient room: Yes.  ; Findings: none APPEARANCE/BEHAVIOR Cooperative,calm NEURO ASSESSMENT Orientation: person, place Hallucinations: none noted at this time Speech: Normal Gait: shuffling RESPIRATORY ASSESSMENT Breathing Pattern-regular, no respiratory distress noted CARDIOVASCULAR ASSESSMENT Skin color appropriate for age and race GASTROINTESTINAL ASSESSMENT no GI distress noted EXTREMITIES Moves all extremities, no distress noted PLAN OF CARE Provide calm/safe environment. Vital signs assessed twice daily. ED BHU Assessment once each 12-hour shift. Collaborate  with intake RN daily or as condition indicates. Assure the ED provider has rounded once each shift. Provide and encourage hygiene. Provide redirection as needed. Assess for escalating behavior; address immediately and inform ED provider.  Assess family dynamic and appropriateness for visitation as needed: Yes.  Educate the patient/family about BHU procedures/visitation: Yes.

## 2014-12-27 NOTE — ED Notes (Signed)
BEHAVIORAL HEALTH ROUNDING Patient sleeping: No. Patient alert and oriented: yes Behavior appropriate: Yes.   Nutrition and fluids offered: Yes  Toileting and hygiene offered: Yes  Sitter present: q15 min observations Law enforcement present: Yes [employer]  ENVIRONMENTAL ASSESSMENT Potentially harmful objects out of patient reach: Yes.   Personal belongings secured: Yes.   Patient dressed in hospital provided attire only: Yes.   Plastic bags out of patient reach: Yes.   Patient care equipment (cords, cables, call bells, lines, and drains) shortened, removed, or accounted for: Yes.   Equipment and supplies removed from bottom of stretcher: Yes.   Potentially toxic materials out of patient reach: Yes.   Sharps container removed or out of patient reach: Yes.    ED BHU [place] Is the patient under IVC or is there intent for IVC: Yes.    Is IVC current? Yes Is the patient medically cleared: Yes.   Is there vacancy in the ED BHU: Yes.   Is the population mix appropriate for patient: No, pt high fall risk, shuffling gait, requires assistance with ADL's   Is the patient awaiting placement in inpatient or outpatient setting: Yes.   Has the patient had a psychiatric consult: Yes.   Survey of unit performed for contraband, proper placement and condition of furniture, tampering with fixtures in bathroom, shower, and each patient room: Yes.  ; Findings: none APPEARANCE/BEHAVIOR Cooperative,calm NEURO ASSESSMENT Orientation: person, place Hallucinations: none noted at this time Speech: Normal Gait: shuffling RESPIRATORY ASSESSMENT Breathing Pattern-regular, no respiratory distress noted CARDIOVASCULAR ASSESSMENT Skin color appropriate for age and race GASTROINTESTINAL ASSESSMENT no GI distress noted EXTREMITIES Moves all extremities, no distress noted PLAN OF CARE Provide calm/safe environment. Vital signs assessed twice daily. ED BHU Assessment once each 12-hour  shift. Collaborate with intake RN daily or as condition indicates. Assure the ED provider has rounded once each shift. Provide and encourage hygiene. Provide redirection as needed. Assess for escalating behavior; address immediately and inform ED provider.  Assess family dynamic and appropriateness for visitation as needed: Yes.  Educate the patient/family about BHU procedures/visitation: Yes.

## 2014-12-27 NOTE — ED Notes (Signed)
Patient assigned to appropriate care area. Patient oriented to unit/care area: Informed that, for their safety, care areas are designed for safety and monitored by security cameras at all times; and visiting hours explained to patient. Patient verbalizes understanding, and verbal contract for safety obtained. 

## 2014-12-27 NOTE — ED Notes (Signed)
BEHAVIORAL HEALTH ROUNDING Patient sleeping: No. Patient alert and oriented: yes Behavior appropriate: Yes.   Nutrition and fluids offered: Yes  Toileting and hygiene offered: Yes  Sitter present: q15 min observations Law enforcement present: Yes [employer] 

## 2014-12-27 NOTE — ED Notes (Signed)
BEHAVIORAL HEALTH ROUNDING Patient sleeping: Yes.   Patient alert and oriented: not applicable Behavior appropriate: Yes.    Nutrition and fluids offered: No Toileting and hygiene offered: No Sitter present: q15 minute observations Law enforcement present: Yes [employer] 

## 2014-12-27 NOTE — ED Notes (Signed)
Report received from [place].  Pt in room. No complaints or concerns voiced at this time. No abnormal behavior noted at this time. Will continue to monitor with q15 min checks. ODS officer in area.

## 2014-12-27 NOTE — ED Notes (Signed)
Report received from [place] in room. No complaints or concerns voiced at this time. No abnormal behavior noted at this time. Will continue to monitor with q15 min checks. ODS officer in area.

## 2014-12-27 NOTE — ED Provider Notes (Signed)
Patient describes some chest discomfort which is not unusual for her with her stay here in emergency department under psychiatric observation. EKG was performed which shows no acute ischemic changes. I felt this was unlikely to be a life-threatening cause for her chest discomfort at this time as she does have some mild chest wall pain with palpation.  ED ECG REPORT I, Daymon Larsen, the attending physician, personally viewed and interpreted this ECG.  Date: 12/27/2014 EKG Time: 1002 Rate: 85 Rhythm: normal sinus rhythm QRS Axis: normal Intervals: normal ST/T Wave abnormalities: Nonspecific ST-T wave changes Conduction Disutrbances: none Narrative Interpretation: unremarkable No significant change from previous EKGs  Daymon Larsen, MD 12/27/14 1324

## 2014-12-27 NOTE — Progress Notes (Signed)
CSW received return call from patient's son [name]. Provided him an update on patient's status.  [name] confirmed that patient is unable to return to [facility].  States it is unsafe for patient to come to his home.  Patient lived with son.  As a result of her paranoid mult ED visits.  This also happened at current group home. [name]   informed CSW he has completed all paper work for patient to go to [facility] and taken her clothes there.    CSW will continue to follow patient and assist with disposition to [facility] once the PASSAR is received.  Casimer Lanius. Latanya Presser, MSW Clinical Social Work Department [phone] 5:08 PM

## 2014-12-27 NOTE — ED Notes (Signed)
BEHAVIORAL HEALTH ROUNDING Patient sleeping: No. Patient alert and oriented: no Behavior appropriate: Yes.  ; If no, describe:  Nutrition and fluids offered: Yes  Toileting and hygiene offered: Yes  Sitter present: no Law enforcement present: Yes  

## 2014-12-27 NOTE — NC FL2 (Signed)
[place] LEVEL OF CARE SCREENING TOOL     IDENTIFICATION  Patient Name: [name] [name] Birthdate: 21-Feb-1946 Sex: female Admission Date (Current Location): 12/24/2014  [facility] and [place] Number:  [name] )  ([id number]) Facility and Address:  [facility], [address] [place], [place] [address]      Provider Number: [id number]  Attending Physician Name and Address:  No att. providers found  Relative Name and Phone Number:       Current Level of Care: Hospital Recommended Level of Care: [place] Prior Approval Number:    Date Approved/Denied:   PASRR Number:    Discharge Plan: Domiciliary (Rest home)    Current Diagnoses: Patient Active Problem List   Diagnosis Date Noted  . Schizoaffective disorder, bipolar type ([place])   . Schizoaffective disorder ([place]) 12/23/2014  . Schizoaffective disorder, bipolar type with good prognostic features ([place]) 10/30/2014  . Urinary incontinence 10/30/2014  . GERD (gastroesophageal reflux disease) 10/30/2014  . OSA (obstructive sleep apnea) 10/30/2014  . Osteoarthrosis, unspecified whether generalized or localized, involving lower leg 10/30/2014  . COPD (chronic obstructive pulmonary disease) ([place]) 10/30/2014  . Chronic diastolic CHF (congestive heart failure) ([place]) 05/15/2014  . Obesity   . History of colon polyps 03/09/2013  . Renal mass 06/26/2011  . Lytic bone lesion of hip 06/25/2011  . Hypertension 06/24/2011  . Diabetes mellitus ([place]) 06/24/2011    Orientation ACTIVITIES/SOCIAL BLADDER RESPIRATION    Self, Time, Situation, Place    Incontinent Normal  BEHAVIORAL SYMPTOMS/MOOD NEUROLOGICAL BOWEL NUTRITION STATUS      Continent Diet (regular)  PHYSICIAN VISITS COMMUNICATION OF NEEDS Height & Weight Skin    Verbally 5\' 6"  (167.6 cm) 273 lbs. Normal          AMBULATORY STATUS RESPIRATION    Supervision limited Normal      Personal Care Assistance Level of  Assistance  Bathing, Feeding, Dressing Bathing Assistance: Limited assistance Feeding assistance: Independent Dressing Assistance: Limited assistance      Functional Limitations Info  Sight, Hearing, Speech Sight Info: Adequate Hearing Info: Adequate Speech Info: Adequate       SPECIAL CARE FACTORS FREQUENCY                      Additional Factors Info  Psychotropic, Allergies   Allergies Info:  (Haldol, Metformin, Raspberry)           Current Medications (12/27/2014): Current Facility-Administered Medications  Medication Dose Route Frequency Provider Last Rate Last Dose  . acetaminophen (TYLENOL) tablet 650 mg  650 mg Oral Q4H PRN Daymon Larsen, MD   650 mg at 12/27/14 1200  . albuterol (PROVENTIL) (2.5 MG/3ML) 0.083% nebulizer solution 2.5 mg  2.5 mg Nebulization Q6H PRN Nance Pear, MD      . atorvastatin (LIPITOR) tablet 20 mg  20 mg Oral QHS Nance Pear, MD   20 mg at 12/26/14 2257  . fluPHENAZine (PROLIXIN) tablet 10 mg  10 mg Oral QHS Nance Pear, MD   10 mg at 12/26/14 2256  . furosemide (LASIX) tablet 40 mg  40 mg Oral Q M,W,F Nance Pear, MD   40 mg at 12/27/14 0942  . glipiZIDE (GLUCOTROL) tablet 10 mg  10 mg Oral BID Nance Pear, MD   10 mg at 12/27/14 0942  . linagliptin (TRADJENTA) tablet 5 mg  5 mg Oral Daily Nance Pear, MD   5 mg at 12/27/14 0943  . lisinopril (PRINIVIL,ZESTRIL) tablet 10 mg  10 mg Oral Daily Gonzella Lex, MD   10 mg at 12/27/14 0943  . mometasone-formoterol (DULERA) 100-5 MCG/ACT inhaler 2 puff  2 puff Inhalation BID Nance Pear, MD   2 puff at 12/27/14 0805  . verapamil (CALAN) tablet 40 mg  40 mg Oral 4 times per day Nance Pear, MD   40 mg at 12/27/14 1200   Current Outpatient Prescriptions  Medication Sig Dispense Refill  . albuterol (PROVENTIL HFA;VENTOLIN HFA) 108 (90 BASE) MCG/ACT inhaler Inhale 2 puffs into the lungs 4 (four) times daily as needed for wheezing or shortness of breath.    Marland Kitchen  aspirin EC 81 MG tablet Take 81 mg by mouth daily.    Marland Kitchen atorvastatin (LIPITOR) 20 MG tablet Take 20 mg by mouth at bedtime.    . fluPHENAZine (PROLIXIN) 10 MG tablet Take 10 mg by mouth at bedtime.    . fluPHENAZine decanoate (PROLIXIN) 25 MG/ML injection Inject 50 mg into the muscle every 21 ( twenty-one) days.    . Fluticasone-Salmeterol (ADVAIR) 250-50 MCG/DOSE AEPB Inhale 1 puff into the lungs 2 (two) times daily.    . furosemide (LASIX) 40 MG tablet Take 40 mg by mouth every Monday, Wednesday, and Friday.    Marland Kitchen glipiZIDE (GLUCOTROL) 10 MG tablet Take 10 mg by mouth 2 (two) times daily.    . insulin glargine (LANTUS) 100 UNIT/ML injection Inject 0.15 mLs (15 Units total) into the skin at bedtime. 10 mL 2  . lisinopril (PRINIVIL,ZESTRIL) 5 MG tablet Take 5 mg by mouth daily.    . ranitidine (ZANTAC) 150 MG tablet Take 150 mg by mouth 2 (two) times daily.    . sitaGLIPtin (JANUVIA) 100 MG tablet Take 100 mg by mouth daily.    . verapamil (CALAN) 40 MG tablet Take 40 mg by mouth every 6 (six) hours.    . benztropine (COGENTIN) 0.5 MG tablet Take 1 tablet (0.5 mg total) by mouth 2 (two) times daily. (Patient not taking: Reported on 12/24/2014) 60 tablet 0  . dicyclomine (BENTYL) 20 MG tablet Take 1 tablet (20 mg total) by mouth 3 (three) times daily as needed for spasms. (Patient not taking: Reported on 12/24/2014) 30 tablet 0  . docusate sodium (COLACE) 100 MG capsule Take 2 capsules (200 mg total) by mouth 2 (two) times daily. (Patient not taking: Reported on 12/24/2014) 120 capsule 0  . fluPHENAZine (PROLIXIN) 5 MG tablet Take 3 tablets (15 mg total) by mouth at bedtime. (Patient not taking: Reported on 12/24/2014) 90 tablet 0  . fluPHENAZine decanoate (PROLIXIN) 25 MG/ML injection Inject 1 mL (25 mg total) into the muscle every 14 (fourteen) days. (Patient not taking: Reported on 12/24/2014) 5 mL 0  . lisinopril (PRINIVIL,ZESTRIL) 20 MG tablet Take 1 tablet (20 mg total) by mouth every morning.  (Patient not taking: Reported on 12/24/2014) 30 tablet 0  . metoCLOPramide (REGLAN) 10 MG tablet Take 1 tablet (10 mg total) by mouth every 6 (six) hours as needed for nausea or vomiting. (Patient not taking: Reported on 12/24/2014) 12 tablet 1  . tiotropium (SPIRIVA) 18 MCG inhalation capsule Place 1 capsule (18 mcg total) into inhaler and inhale daily. (Patient not taking: Reported on 12/23/2014) 30 capsule 12  . traMADol (ULTRAM) 50 MG tablet Take 1 tablet (50 mg total) by mouth every 6 (six) hours as needed. (Patient not taking: Reported on 12/23/2014) 20 tablet 0   Do not use this list as official medication orders. Please verify with discharge summary.  Discharge  Medications:   Medication List    ASK your doctor about these medications        albuterol 108 (90 BASE) MCG/ACT inhaler  Commonly known as:  PROVENTIL HFA;VENTOLIN HFA  Inhale 2 puffs into the lungs 4 (four) times daily as needed for wheezing or shortness of breath.     aspirin EC 81 MG tablet  Take 81 mg by mouth daily.     atorvastatin 20 MG tablet  Commonly known as:  LIPITOR  Take 20 mg by mouth at bedtime.     benztropine 0.5 MG tablet  Commonly known as:  COGENTIN  Take 1 tablet (0.5 mg total) by mouth 2 (two) times daily.     dicyclomine 20 MG tablet  Commonly known as:  BENTYL  Take 1 tablet (20 mg total) by mouth 3 (three) times daily as needed for spasms.     docusate sodium 100 MG capsule  Commonly known as:  COLACE  Take 2 capsules (200 mg total) by mouth 2 (two) times daily.     fluPHENAZine 10 MG tablet  Commonly known as:  PROLIXIN  Take 10 mg by mouth at bedtime.     fluPHENAZine 5 MG tablet  Commonly known as:  PROLIXIN  Take 3 tablets (15 mg total) by mouth at bedtime.     fluPHENAZine decanoate 25 MG/ML injection  Commonly known as:  PROLIXIN  Inject 50 mg into the muscle every 21 ( twenty-one) days.     fluPHENAZine decanoate 25 MG/ML injection  Commonly known as:  PROLIXIN  Inject 1  mL (25 mg total) into the muscle every 14 (fourteen) days.     Fluticasone-Salmeterol 250-50 MCG/DOSE Aepb  Commonly known as:  ADVAIR  Inhale 1 puff into the lungs 2 (two) times daily.     furosemide 40 MG tablet  Commonly known as:  LASIX  Take 40 mg by mouth every Monday, Wednesday, and Friday.     glipiZIDE 10 MG tablet  Commonly known as:  GLUCOTROL  Take 10 mg by mouth 2 (two) times daily.     insulin glargine 100 UNIT/ML injection  Commonly known as:  LANTUS  Inject 0.15 mLs (15 Units total) into the skin at bedtime.     lisinopril 5 MG tablet  Commonly known as:  PRINIVIL,ZESTRIL  Take 5 mg by mouth daily.     lisinopril 20 MG tablet  Commonly known as:  PRINIVIL,ZESTRIL  Take 1 tablet (20 mg total) by mouth every morning.     metoCLOPramide 10 MG tablet  Commonly known as:  REGLAN  Take 1 tablet (10 mg total) by mouth every 6 (six) hours as needed for nausea or vomiting.     ranitidine 150 MG tablet  Commonly known as:  ZANTAC  Take 150 mg by mouth 2 (two) times daily.     sitaGLIPtin 100 MG tablet  Commonly known as:  JANUVIA  Take 100 mg by mouth daily.     tiotropium 18 MCG inhalation capsule  Commonly known as:  SPIRIVA  Place 1 capsule (18 mcg total) into inhaler and inhale daily.     traMADol 50 MG tablet  Commonly known as:  ULTRAM  Take 1 tablet (50 mg total) by mouth every 6 (six) hours as needed.     verapamil 40 MG tablet  Commonly known as:  CALAN  Take 40 mg by mouth every 6 (six) hours.        Relevant Imaging Results:  Relevant Lab  Results:  Recent Labs    Additional Information  [phone])  Maurine Cane, LCSW

## 2014-12-27 NOTE — ED Provider Notes (Signed)
-----------------------------------------   6:48 AM on 12/27/2014 -----------------------------------------   Blood pressure 149/87, pulse 75, temperature 97.5 F (36.4 C), temperature source Oral, resp. rate 18, height 5\' 6"  (1.676 m), weight 273 lb (123.832 kg), SpO2 100 %.  The patient had no acute events since last update.  Calm and cooperative at this time.  Disposition is pending per Psychiatry/Behavioral Medicine team recommendations.     Paulette Blanch, MD 12/27/14 [phone]

## 2014-12-27 NOTE — Clinical Social Work Note (Signed)
Clinical Social Work Assessment  Patient Details  Name: [name] [name] MRN: [id number] Date of Birth: Dec 17, 1945  Date of referral:  12/27/14               Reason for consult:  Facility Placement                Permission sought to share information with:  Facility Sport and exercise psychologist, Guardian, Other (ACT team) Permission granted to share information::  Yes, Verbal Permission Granted  Name::        Agency::     Relationship::     Contact Information:     Housing/Transportation Living arrangements for the past 2 months:  Group Home Source of Information:  Patient, Medical Team, Psychiatric Consultation, Outpatient Provider Patient Interpreter Needed:  None Criminal Activity/Legal Involvement Pertinent to Current Situation/Hospitalization:    Significant Relationships:  Adult Children, Mental Health Provider Lives with:  Facility Resident Do you feel safe going back to the place where you live?  No (patient does not want to return to group home) Need for family participation in patient care:     Care giving concerns:  Patient is unable to return to group home   Social Worker assessment / plan:  Holiday representative (CSW) consult, patient is from group home [place].   Patient was  alert and oriented  and engaged in conversation with CSW. .  CSW introduced self and explained role of CSW department. Patient currently lives in group home, prior to group home she was in skilled level care, patient does not want to return to group home and group home will not allow her to return.  ACT team has arranged for patient to discharge to [facility], however a PASARR was not obtained.  CSW explained to patient that the PASARR is needed.  Patient would like CSW to assist in getting this number so that she can discharge to [facility].      Patient's support system is her son [name] who is her guardian [phone].  CSW has made several attempts to call him today to discuss  disposition for patient and has not been successful in reaching him.  The voice mail is full.  Additional support is patient's [facility], spoke to [place]  She is unable to assist with getting the PASSAR.       CSW submitting all information for the PASARR, will complete FL2 and await for PASARR review.     Employment status:  Disabled (Comment on whether or not currently receiving Disability) Insurance information:    PT Recommendations:  Not assessed at this time Information / Referral to community resources:     Patient/Family's Response to care:  Patient is appreciative of CSW assisting with getting her to [employer] [employer]  Patient/Family's Understanding of and Emotional Response to Diagnosis, Current Treatment, and Prognosis:  Unable to reach patient's son.  Patient understands CSW will continue to work with the ACT team, her son and [facility] on her disposition.   Emotional Assessment Appearance:  Appears stated age Attitude/Demeanor/Rapport:  Paranoid Affect (typically observed):  Accepting, Overwhelmed, Anxious, Pleasant Orientation:  Oriented to Self, Oriented to Place, Oriented to  Time, Oriented to Situation Alcohol / Substance use:  Never Used Psych involvement (Current and /or in the community):  Outpatient Provider, Yes (Comment) (followed by psych)  Discharge Needs  Concerns to be addressed:  Homelessness, Care Coordination Readmission within the last 30 days:  Yes Current discharge risk:  Homeless, Chronically ill, Psychiatric Illness Barriers to Discharge:  Homeless with medical needs, Awaiting State Approval (Pasarr)   Maurine Cane, LCSW 12/27/2014, 4:08 PM

## 2014-12-27 NOTE — BHH Counselor (Addendum)
Late Entry--------Writer spoke with patient son [name] [phone]) and he stated he's been working with [facility] to get mother placed in "[facility] He was under the impression; she would be transferred from the ER to the facility.  Writer [place] [phone]) and was informed, the patient bed would be available Friday, December 27, 2014. However, they were needing a PASSAR Number. Writer inquired if they've already submitted the request to the state. She stated, she was under the impression the patient's ACT Team was working on getting the number.  Writer called [facility] ([phone]) and she stated, the plan was for the patient moved to the new facility from the ER. When asked about the PASSAR Number, she stated they was under the impression the ER/Hospital was going to get the number.  Writer asked her to contact the previous [facility]) and see if she had one from the time she with them. [facility]([phone]) called back and stated, the patient didn't have a PASSAR from the time she was at [facility]. Their facility didn't require one.  However, she had one when she was at [facility].  Writer contacted, [facility] Social Worker Environmental manager) and updated her about the situation. She was able to get the previous PASSAR Number but it's expired. It can't be used for her admission with [facility].  Writer updated Psych MD (Dr. Weber Cooks) of the situation.

## 2014-12-27 NOTE — ED Notes (Signed)

## 2014-12-27 NOTE — ED Notes (Signed)
P[t states has bil shoulder pain and chest pain, will do ekg

## 2014-12-28 DIAGNOSIS — F329 Major depressive disorder, single episode, unspecified: Secondary | ICD-10-CM | POA: Diagnosis not present

## 2014-12-28 LAB — GLUCOSE, CAPILLARY
GLUCOSE-CAPILLARY: 146 mg/dL — AB (ref 65–99)
GLUCOSE-CAPILLARY: 211 mg/dL — AB (ref 65–99)

## 2014-12-28 MED ORDER — IPRATROPIUM-ALBUTEROL 0.5-2.5 (3) MG/3ML IN SOLN: 3.0000 mL | Freq: Once | RESPIRATORY_TRACT | Status: AC

## 2014-12-28 NOTE — ED Notes (Signed)

## 2014-12-28 NOTE — ED Notes (Signed)
Pt moved to [place] and oriented to unit.

## 2014-12-28 NOTE — ED Notes (Signed)
BEHAVIORAL HEALTH ROUNDING Patient sleeping: Yes.   Patient alert and oriented: not applicable Behavior appropriate: Yes.    Nutrition and fluids offered: No Toileting and hygiene offered: No Sitter present: q15 minute observations Law enforcement present: Yes [employer] 

## 2014-12-28 NOTE — ED Notes (Signed)
RN prayed with pt and notified chaplain upon pts request.

## 2014-12-28 NOTE — ED Notes (Signed)
Pt in room. No complaints or concerns voiced at this time. No abnormal behavior noted at this time. Will continue to monitor with q15 min checks. ODS officer in area. 

## 2014-12-28 NOTE — ED Notes (Signed)
BEHAVIORAL HEALTH ROUNDING Patient sleeping: No. Patient alert and oriented: yes Behavior appropriate: Yes.   Nutrition and fluids offered: Yes  Toileting and hygiene offered: Yes  Sitter present: q15 min observations Law enforcement present: Yes [employer] 

## 2014-12-28 NOTE — ED Notes (Signed)
Pt urinating on herself, bed and clothing. Pt unable to dress herself. Pt moved back to main ED in quad.

## 2014-12-28 NOTE — Progress Notes (Signed)
   12/28/14 1800  Clinical Encounter Type  Visited With Patient  Visit Type Spiritual support  Referral From Nurse  Consult/Referral To Chaplain  Spiritual Encounters  Spiritual Needs Prayer  Paged to ED.  Provided pastoral presence, support and prayer to patient.  Patient thanked me for my visit.  [place] [phone]

## 2014-12-28 NOTE — ED Notes (Signed)
Pt has received her Production assistant, radio.

## 2014-12-28 NOTE — ED Notes (Signed)
Pt reporting c/o sob and is demonstrating increased RR. RN asked pt is she would like her inhaler. Pt replies yes.

## 2014-12-28 NOTE — ED Provider Notes (Signed)
-----------------------------------------   6:58 AM on 12/28/2014 -----------------------------------------   Blood pressure 159/86, pulse 76, temperature 98.7 F (37.1 C), temperature source Oral, resp. rate 18, height 5\' 6"  (1.676 m), weight 273 lb (123.832 kg), SpO2 94 %.  The patient had no acute events since last update.  Calm and cooperative at this time.  Disposition is pending per Psychiatry/Behavioral Medicine team recommendations.     Paulette Blanch, MD 12/28/14 [phone]

## 2014-12-28 NOTE — ED Notes (Signed)
Pt on telephone.  

## 2014-12-28 NOTE — ED Notes (Signed)
Pt showered and scrubs changed

## 2014-12-28 NOTE — ED Notes (Signed)
Pt with continued c/o chest pain; Pt requesting to talk to [place]. Attempted to call, no answer.

## 2014-12-28 NOTE — ED Notes (Signed)
Pts visitor; Son has arrived to room. Visitation is being supervised by Jeannene Patella, NT.

## 2014-12-28 NOTE — ED Notes (Signed)
ivc /placement pending

## 2014-12-29 DIAGNOSIS — F329 Major depressive disorder, single episode, unspecified: Secondary | ICD-10-CM | POA: Diagnosis not present

## 2014-12-29 LAB — GLUCOSE, CAPILLARY: GLUCOSE-CAPILLARY: 282 mg/dL — AB (ref 65–99)

## 2014-12-29 MED ORDER — TRAMADOL HCL 50 MG PO TABS
50.0000 mg | ORAL_TABLET | Freq: Once | ORAL | Status: AC
  Administered 2014-12-29: 50 mg via ORAL

## 2014-12-29 MED ORDER — DOCUSATE SODIUM 100 MG PO CAPS
100.0000 mg | ORAL_CAPSULE | Freq: Every day | ORAL | Status: DC
Start: 2014-12-29 — End: 2015-01-08
  Administered 2014-12-29 – 2015-01-08 (×11): 100 mg via ORAL
  Filled 2014-12-29 (×4): qty 1

## 2014-12-29 NOTE — ED Notes (Signed)
Pt in room. No complaints or concerns voiced at this time. No abnormal behavior noted at this time. Will continue to monitor with q15 min checks. ODS officer in area. 

## 2014-12-29 NOTE — ED Notes (Signed)
BEHAVIORAL HEALTH ROUNDING Patient sleeping: No. Patient alert and oriented: pt alert and cooperative Behavior appropriate: Yes.  ; If no, describe:  Nutrition and fluids offered: Yes  Toileting and hygiene offered: Yes  Sitter present: not applicable Law enforcement present: Yes

## 2014-12-29 NOTE — ED Notes (Signed)
BEHAVIORAL HEALTH ROUNDING Patient sleeping: Yes.   Patient alert and oriented: not applicable Behavior appropriate: Yes.    Nutrition and fluids offered: No Toileting and hygiene offered: No Sitter present: q15 minute observations Law enforcement present: Yes [employer] 

## 2014-12-29 NOTE — ED Notes (Signed)
Report received from RN Kerry  Pt in room. No complaints or concerns voiced at this time. No abnormal behavior noted at this time. Will continue to monitor with q15 min checks. ODS officer in area.

## 2014-12-29 NOTE — ED Notes (Signed)
Pt assisted oob to chair,  Warm blankets given and placed on both arms,   She also a pillow was placed under both arms for comfort and pt reports that she is comfortable in that position

## 2014-12-29 NOTE — ED Notes (Signed)
BEHAVIORAL HEALTH ROUNDING Patient sleeping: Yes.   Patient alert and oriented: not applicable Behavior appropriate: Yes.  ; If no, describe:  Nutrition and fluids offered: No Toileting and hygiene offered: No Sitter present: not applicable Law enforcement present: Yes  

## 2014-12-29 NOTE — Progress Notes (Signed)
PASARR number needed for patient's placement at [facility] is still under review.  All requested information has been submitted to [place] MUST in order to obtain the number.  A call was made to [facility] MUST however the office is closed on the weekend. CSW will follow up with [place] MUST in reference to the pending number on Monday.  Casimer Lanius. Latanya Presser, MSW Clinical Social Work Department [phone] 8:50 AM

## 2014-12-29 NOTE — ED Notes (Signed)
BEHAVIORAL HEALTH ROUNDING Patient sleeping: No. Patient alert and oriented: pt alert, cooperative Behavior appropriate: Yes.  ;  Nutrition and fluids offered: Yes  Toileting and hygiene offered: Yes  Sitter present: not applicable Law enforcement present: Yes

## 2014-12-29 NOTE — Progress Notes (Signed)
   12/29/14 1800  Clinical Encounter Type  Visited With Patient  Visit Type Spiritual support  Referral From Nurse  Spiritual Encounters  Spiritual Needs Prayer  Chaplain Keath Matera engaged patient in prayer and support.

## 2014-12-29 NOTE — ED Notes (Signed)

## 2014-12-29 NOTE — ED Notes (Signed)
Pt ambulatory to bathroom,  Then she was taken for a short walk around the department.  She is pleasant and cooperative.

## 2014-12-29 NOTE — ED Provider Notes (Signed)
-----------------------------------------   7:11 AM on 12/29/2014 -----------------------------------------   Blood pressure 172/101, pulse 76, temperature 98.7 F (37.1 C), temperature source Oral, resp. rate 18, height 5\' 6"  (1.676 m), weight 273 lb (123.832 kg), SpO2 94 %.  The patient had no acute events since last update.  Calm and cooperative at this time.  Disposition is pending per Psychiatry/Behavioral Medicine team recommendations.     Delman Kitten, MD 12/29/14 [phone]

## 2014-12-29 NOTE — ED Notes (Signed)
BEHAVIORAL HEALTH ROUNDING Patient sleeping: Yes.   Patient alert and oriented: n/a Behavior appropriate: Yes.  ; If no, describe:  Nutrition and fluids offered: No Toileting and hygiene offered: No Sitter present: not applicable Law enforcement present: Yes

## 2014-12-29 NOTE — ED Notes (Signed)
BEHAVIORAL HEALTH ROUNDING Patient sleeping: Yes.   Patient alert and oriented: not applicable Behavior appropriate: Yes.    Nutrition and fluids offered: No Toileting and hygiene offered: No Sitter present: q15 minute observations Law enforcement present: Yes [employer]  ENVIRONMENTAL ASSESSMENT Potentially harmful objects out of patient reach: Yes.   Personal belongings secured: Yes.   Patient dressed in hospital provided attire only: Yes.   Plastic bags out of patient reach: Yes.   Patient care equipment (cords, cables, call bells, lines, and drains) shortened, removed, or accounted for: Yes.   Equipment and supplies removed from bottom of stretcher: Yes.   Potentially toxic materials out of patient reach: Yes.   Sharps container removed or out of patient reach: Yes.

## 2014-12-30 DIAGNOSIS — F329 Major depressive disorder, single episode, unspecified: Secondary | ICD-10-CM | POA: Diagnosis not present

## 2014-12-30 MED ORDER — IBUPROFEN 600 MG PO TABS
600.0000 mg | ORAL_TABLET | Freq: Once | ORAL | Status: AC
  Administered 2014-12-30: 600 mg via ORAL
  Filled 2014-12-30: qty 1

## 2014-12-30 NOTE — ED Provider Notes (Signed)
-----------------------------------------   9:26 AM on 12/30/2014 -----------------------------------------   Blood pressure 148/97, pulse 73, temperature 98.6 F (37 C), temperature source Oral, resp. rate 18, height 5\' 6"  (1.676 m), weight 273 lb (123.832 kg), SpO2 100 %.  The patient had no acute events since last update. She does request some pain medication for mild headache.  I think a single dose of ibuprofen will be effective and safe even with her diabetes. Calm and cooperative at this time. Remains medically stable. Disposition is pending per Psychiatry/Behavioral Medicine team recommendations.     Carrie Mew, MD 12/30/14 [phone]

## 2014-12-30 NOTE — ED Notes (Addendum)
ED BHU PLACEMENT JUSTIFICATION  Is the patient under IVC or is there intent for IVC: Yes.  Is the patient medically cleared: Yes.  Is there vacancy in the ED BHU: Yes.  Is the population mix appropriate for patient: Yes.  Is the patient awaiting placement in inpatient or outpatient setting: Yes.  Has the patient had a psychiatric consult: Yes.  Survey of unit performed for contraband, proper placement and condition of furniture, tampering with fixtures in bathroom, shower, and each patient room: Yes. ; Findings: All clear  APPEARANCE/BEHAVIOR  calm, cooperative and adequate rapport can be established  NEURO ASSESSMENT  Orientation: time, place and person  Hallucinations: No.None noted (Hallucinations)  Speech: Normal  Gait: normal  RESPIRATORY ASSESSMENT  WNL  CARDIOVASCULAR ASSESSMENT  WNL  GASTROINTESTINAL ASSESSMENT  WNL  EXTREMITIES  WNL  PLAN OF CARE  Provide calm/safe environment. Vital signs assessed twice daily. ED BHU Assessment once each 12-hour shift. Collaborate with intake RN daily or as condition indicates. Assure the ED provider has rounded once each shift. Provide and encourage hygiene. Provide redirection as needed. Assess for escalating behavior; address immediately and inform ED provider.  Assess family dynamic and appropriateness for visitation as needed: Yes. ; If necessary, describe findings:  Educate the patient/family about BHU procedures/visitation: Yes. ; If necessary, describe findings: Pt is calm and cooperative at this time. Pt understanding and accepting of unit procedures/rules. Will continue to monitor.  

## 2014-12-30 NOTE — ED Notes (Signed)
Pt in room. No complaints or concerns voiced at this time. No abnormal behavior noted at this time. Will continue to monitor with q15 min checks. ODS officer in area. 

## 2014-12-30 NOTE — ED Notes (Signed)
ENVIRONMENTAL ASSESSMENT  Potentially harmful objects out of patient reach: Yes.  Personal belongings secured: Yes.  Patient dressed in hospital provided attire only: Yes.  Plastic bags out of patient reach: Yes.  Patient care equipment (cords, cables, call bells, lines, and drains) shortened, removed, or accounted for: Yes.  Equipment and supplies removed from bottom of stretcher: Yes.  Potentially toxic materials out of patient reach: Yes.  Sharps container removed or out of patient reach: Yes.   BEHAVIORAL HEALTH ROUNDING  Patient sleeping: No.  Patient alert and oriented: yes  Behavior appropriate: Yes. ; If no, describe:  Nutrition and fluids offered: Yes  Toileting and hygiene offered: Yes  Sitter present: not applicable  Law enforcement present: Yes [employer]  

## 2014-12-30 NOTE — ED Notes (Signed)
BEHAVIORAL HEALTH ROUNDING Patient sleeping: Yes.   Patient alert and oriented: not applicable SLEEPING Behavior appropriate: Yes.  ; If no, describe: SLEEPING Nutrition and fluids offered: No SLEEPING Toileting and hygiene offered: NoSLEEPING Sitter present: not applicable Law enforcement present: Yes ODS 

## 2014-12-30 NOTE — ED Notes (Signed)
BEHAVIORAL HEALTH ROUNDING Patient sleeping: No. Patient alert and oriented: yes Behavior appropriate: Yes.  ; If no, describe:  Nutrition and fluids offered: yes Toileting and hygiene offered: Yes  Sitter present: q15 minute observations and security camera monitoring Law enforcement present: Yes  ODS  

## 2014-12-30 NOTE — ED Notes (Signed)
BEHAVIORAL HEALTH ROUNDING  Patient sleeping: No.  Patient alert and oriented: yes  Behavior appropriate: Yes. ; If no, describe:  Nutrition and fluids offered: Yes  Toileting and hygiene offered: Yes  Sitter present: not applicable  Law enforcement present: Yes [employer]DS  

## 2014-12-30 NOTE — ED Notes (Signed)

## 2014-12-30 NOTE — ED Notes (Signed)
Breakfast provided  Pt observed with no unusual behavior  Appropriate to stimulation  No verbalized needs or concerns at this time  NAD assessed  Continue to monitor 

## 2014-12-30 NOTE — ED Notes (Signed)
BEHAVIORAL HEALTH ROUNDING Patient sleeping: No. Patient alert and oriented: yes Behavior appropriate: Yes.   Nutrition and fluids offered: Yes  Toileting and hygiene offered: Yes  Sitter present: q15 min observations Law enforcement present: Yes [employer]  ENVIRONMENTAL ASSESSMENT Potentially harmful objects out of patient reach: Yes.   Personal belongings secured: Yes.   Patient dressed in hospital provided attire only: Yes.   Plastic bags out of patient reach: Yes.   Patient care equipment (cords, cables, call bells, lines, and drains) shortened, removed, or accounted for: Yes.   Equipment and supplies removed from bottom of stretcher: Yes.   Potentially toxic materials out of patient reach: Yes.   Sharps container removed or out of patient reach: Yes.    ED BHU [place] Is the patient under IVC or is there intent for IVC: Yes.    Is IVC current? Yes Is the patient medically cleared: Yes.   Is there vacancy in the ED BHU: Yes.   Is the population mix appropriate for patient: No Pt requires assistance with ADLs and with walking periodically   Is the patient awaiting placement in inpatient or outpatient setting: Yes.   Has the patient had a psychiatric consult: Yes.   Survey of unit performed for contraband, proper placement and condition of furniture, tampering with fixtures in bathroom, shower, and each patient room: Yes.  ; Findings: none APPEARANCE/BEHAVIOR Cooperative,calm NEURO ASSESSMENT Orientation:person, place Hallucinations: none noted at this time Speech: Normal Gait: shuffling RESPIRATORY ASSESSMENT Breathing Pattern-regular, no respiratory distress noted CARDIOVASCULAR ASSESSMENT Skin color appropriate for age and race GASTROINTESTINAL ASSESSMENT no GI distress noted EXTREMITIES Moves all extremities, no distress noted PLAN OF CARE Provide calm/safe environment. Vital signs assessed twice daily. ED BHU Assessment once each 12-hour shift.  Collaborate with intake RN daily or as condition indicates. Assure the ED provider has rounded once each shift. Provide and encourage hygiene. Provide redirection as needed. Assess for escalating behavior; address immediately and inform ED provider.  Assess family dynamic and appropriateness for visitation as needed: Yes.  Educate the patient/family about BHU procedures/visitation: Yes.

## 2014-12-30 NOTE — ED Notes (Signed)
Lunch provided along with an extra drink  Pt observed with no unusual behavior  Appropriate to stimulation  No verbalized needs or concerns at this time  NAD assessed  Continue to monitor 

## 2014-12-30 NOTE — ED Notes (Signed)

## 2014-12-30 NOTE — Consult Note (Signed)
  Psychiatry: Continued follow-up for this 69 year old woman with schizoaffective disorder. Patient interviewed. Chart reviewed. Patient has no new complaints. Says that she is been feeling fine. She is hoping that she will be able to get into her new living situation soon. Physically she is feeling okay and she is eating well.  Patient is showing good eye contact. She is talkative and pleasant but not agitated. No threats.  Vital signs stable. No evidence of any acute medical problems.  We have been hoping that she could get sent to the [facility] where she was supposed to be admitted for days now. The patient tells me that the act team contacted her and said that they will follow-up as soon as we can get her out of the hospital. I continued have no idea why there is this long delay. We are hoping that maybe she can get out of the hospital in the next day or so.

## 2014-12-30 NOTE — ED Notes (Signed)
Patient observed lying in bed with eyes closed  Even, unlabored respirations observed   NAD pt appears to be sleeping  I will continue to monitor along with every 15 minute visual observations and ongoing security camera monitoring    

## 2014-12-30 NOTE — ED Notes (Signed)
Supper provided along with an extra drink  Pt observed with no unusual behavior  Appropriate to stimulation  No verbalized needs or concerns at this time  NAD assessed  Continue to monitor 

## 2014-12-30 NOTE — Progress Notes (Signed)
   12/30/14 1820  Clinical Encounter Type  Visited With Patient  Visit Type Follow-up;ED  Referral From Nurse  Consult/Referral To Nurse  Spiritual Encounters  Spiritual Needs Prayer;Emotional  Visited with Pt, provided prayer and reading from the Bible. Burley Saver [phone]

## 2014-12-30 NOTE — ED Notes (Signed)
BEHAVIORAL HEALTH ROUNDING Patient sleeping: No. Patient alert and oriented: yes Behavior appropriate: Yes.   Nutrition and fluids offered: Yes  Toileting and hygiene offered: Yes  Sitter present: q15 min observations Law enforcement present: Yes [employer] 

## 2014-12-30 NOTE — ED Notes (Addendum)
Am meds administered as ordered  Assessment completed  She reports pain in her arms 4/10  Ibuprofen administered as ordered

## 2014-12-31 DIAGNOSIS — F329 Major depressive disorder, single episode, unspecified: Secondary | ICD-10-CM | POA: Diagnosis not present

## 2014-12-31 LAB — GLUCOSE, CAPILLARY: GLUCOSE-CAPILLARY: 110 mg/dL — AB (ref 65–99)

## 2014-12-31 MED ORDER — FLUPHENAZINE HCL 5 MG PO TABS
ORAL_TABLET | ORAL | Status: AC
  Filled 2014-12-31: qty 2

## 2014-12-31 MED ORDER — TRAMADOL HCL 50 MG PO TABS
ORAL_TABLET | ORAL | Status: AC
  Administered 2014-12-31: 50 mg via ORAL
  Filled 2014-12-31: qty 1

## 2014-12-31 MED ORDER — ATORVASTATIN CALCIUM 20 MG PO TABS
ORAL_TABLET | ORAL | Status: AC
  Administered 2014-12-31: 20 mg via ORAL
  Filled 2014-12-31: qty 1

## 2014-12-31 MED ORDER — GLIPIZIDE 10 MG PO TABS
ORAL_TABLET | ORAL | Status: AC
  Administered 2014-12-31: 10 mg via ORAL
  Filled 2014-12-31: qty 1

## 2014-12-31 NOTE — ED Notes (Signed)
Pt spoke with her brother in [place], this RN gave her brother an update as well.

## 2014-12-31 NOTE — ED Notes (Signed)
BEHAVIORAL HEALTH ROUNDING Patient sleeping: No. Patient alert and oriented: yes Behavior appropriate: Yes.   Nutrition and fluids offered: Yes  Toileting and hygiene offered: Yes  Sitter present: q15 min observations Law enforcement present: Yes [employer] 

## 2014-12-31 NOTE — ED Notes (Signed)
Pt given breakfast tray

## 2014-12-31 NOTE — ED Notes (Signed)
BEHAVIORAL HEALTH ROUNDING Patient sleeping: Yes.   Patient alert and oriented: not applicable Behavior appropriate: Yes.    Nutrition and fluids offered: No Toileting and hygiene offered: No Sitter present: q15 minute observations Law enforcement present: Yes [employer] 

## 2014-12-31 NOTE — Progress Notes (Signed)
CSW still following to assist with placement. CSW has not heard from Pt's ACT team in 2 days, but has been in touch with Pt's son regarding pending PASRR needed for placement. Pt's pasrr is pending, has been assigned to screener who will likely see Pt on Wednesday.  CSW will keep staff posted. [facility] still has bed for Pt pending pasrr.   Toma Copier, [place]

## 2014-12-31 NOTE — ED Notes (Signed)

## 2014-12-31 NOTE — ED Notes (Signed)

## 2014-12-31 NOTE — ED Notes (Signed)

## 2014-12-31 NOTE — Progress Notes (Signed)
   12/31/14 1900  Clinical Encounter Type  Visited With Patient  Visit Type Spiritual support  Referral From Nurse  Consult/Referral To Chaplain  Spiritual Encounters  Spiritual Needs Sacred text;Prayer;Emotional  Stress Factors  Patient Stress Factors Loss of control  Chaplain received a page that patient would like to see a chaplain. Reported to the unit and went to visit with the patient. Offered a listening ear and a compassionate presence. The patient requested scripture and prayer. Chaplain Lakyn Mantione A. Clella Mckeel [phone]

## 2014-12-31 NOTE — ED Notes (Signed)
Pt in room. No complaints or concerns voiced at this time. No abnormal behavior noted at this time. Will continue to monitor with q15 min checks. ODS officer in area. 

## 2014-12-31 NOTE — ED Notes (Signed)

## 2014-12-31 NOTE — ED Provider Notes (Signed)
-----------------------------------------   7:41 AM on 12/31/2014 -----------------------------------------   Blood pressure 144/90, pulse 80, temperature 98 F (36.7 C), temperature source Oral, resp. rate 18, height 5\' 6"  (1.676 m), weight 273 lb (123.832 kg), SpO2 98 %.  The patient had no acute events since last update.  Calm and cooperative at this time.  Disposition is pending per Psychiatry/Behavioral Medicine team recommendations.     Schuyler Amor, MD 12/31/14 [phone]

## 2014-12-31 NOTE — ED Notes (Signed)
IVC re-newed by Dr.Clapacs/ pending placement

## 2014-12-31 NOTE — ED Notes (Signed)
Paged Chaplain, pt states she wanted to talk to them. Chaplain will come see pt after seeing her list.

## 2014-12-31 NOTE — ED Notes (Signed)
Trash removed from pt room and pt given sprite. Pt ate all of her breakfast

## 2014-12-31 NOTE — ED Notes (Signed)
Pt sleeping. Breakfast tray placed at bedside 

## 2014-12-31 NOTE — Consult Note (Signed)
  Psychiatry: Follow-up for this 69 year old woman well known to [place] with schizoaffective disorder. For many days now we have simply been waiting for the process to get her placed at the [facility]. I spoke to the patient today and was pleased to see that despite this prolonged wait she is keeping her spirits up. She says her mood is feeling good. She is smiling and upbeat. Denies suicidal or homicidal thoughts.  I do notice that she is having a little more paranoia. On a couple of occasions she talks about people dealing drugs around her. I encouraged her not to get involved in other people's worries and realize that that was really not something that would affect her placement or do her any good. She seemed to find this line of reasoning at least acceptable. No change to current medication. I have discussed the case today with social work. We are still waiting on the appropriate plan to get her out of the emergency room.

## 2015-01-01 DIAGNOSIS — F329 Major depressive disorder, single episode, unspecified: Secondary | ICD-10-CM | POA: Diagnosis not present

## 2015-01-01 LAB — GLUCOSE, CAPILLARY
Glucose-Capillary: 157 mg/dL — ABNORMAL HIGH (ref 65–99)
Glucose-Capillary: 201 mg/dL — ABNORMAL HIGH (ref 65–99)

## 2015-01-01 MED ORDER — DOCUSATE SODIUM 100 MG PO CAPS
ORAL_CAPSULE | ORAL | Status: AC
Start: 2015-01-01 — End: 2015-01-01
  Administered 2015-01-01: 100 mg via ORAL
  Filled 2015-01-01: qty 1

## 2015-01-01 MED ORDER — ATORVASTATIN CALCIUM 20 MG PO TABS
ORAL_TABLET | ORAL | Status: AC
  Administered 2015-01-01: 20 mg via ORAL
  Filled 2015-01-01: qty 1

## 2015-01-01 MED ORDER — GLIPIZIDE 10 MG PO TABS
ORAL_TABLET | ORAL | Status: AC
  Administered 2015-01-01: 10 mg via ORAL
  Filled 2015-01-01: qty 1

## 2015-01-01 MED ORDER — FLUPHENAZINE HCL 5 MG PO TABS
10.0000 mg | ORAL_TABLET | Freq: Once | ORAL | Status: AC
Start: 2015-01-01 — End: 2015-01-01
  Administered 2015-01-01: 10 mg via ORAL

## 2015-01-01 MED ORDER — VERAPAMIL HCL 40 MG PO TABS
ORAL_TABLET | ORAL | Status: AC
  Administered 2015-01-01: 40 mg via ORAL
  Filled 2015-01-01: qty 1

## 2015-01-01 MED ORDER — LISINOPRIL 20 MG PO TABS
ORAL_TABLET | ORAL | Status: AC
  Administered 2015-01-01: 10 mg via ORAL
  Filled 2015-01-01: qty 1

## 2015-01-01 MED ORDER — LINAGLIPTIN 5 MG PO TABS
ORAL_TABLET | ORAL | Status: AC
  Administered 2015-01-01: 5 mg via ORAL
  Filled 2015-01-01: qty 1

## 2015-01-01 MED ORDER — FLUPHENAZINE HCL 5 MG PO TABS
ORAL_TABLET | ORAL | Status: AC
  Administered 2015-01-01: 10 mg via ORAL
  Filled 2015-01-01: qty 2

## 2015-01-01 MED ORDER — VERAPAMIL HCL 40 MG PO TABS
ORAL_TABLET | ORAL | Status: AC
  Administered 2015-01-03: 40 mg via ORAL
  Filled 2015-01-01: qty 1

## 2015-01-01 MED ORDER — FUROSEMIDE 40 MG PO TABS
ORAL_TABLET | ORAL | Status: AC
  Administered 2015-01-01: 40 mg via ORAL
  Filled 2015-01-01: qty 1

## 2015-01-01 MED ORDER — VERAPAMIL HCL 40 MG PO TABS
ORAL_TABLET | ORAL | Status: AC
  Administered 2015-01-02: 40 mg via ORAL
  Filled 2015-01-01: qty 1

## 2015-01-01 NOTE — ED Notes (Signed)
Pt eating lunch at this time

## 2015-01-01 NOTE — Consult Note (Signed)
  Psychiatry: Follow-up for 69 year old woman with schizoaffective disorder. Has been waiting for days in the emergency room for placement. Case reviewed with social work. Hopefully the paperwork will get done soon so that she can go to assisted living. Patient is not having any complaints today. Behavior is appropriate affect is appropriate. No new medical problems. No change to diagnosis or treatment plan.

## 2015-01-01 NOTE — ED Provider Notes (Signed)
-----------------------------------------   6:23 AM on 01/01/2015 -----------------------------------------   Blood pressure 153/97, pulse 68, temperature 97.9 F (36.6 C), temperature source Oral, resp. rate 18, height 5\' 6"  (1.676 m), weight 273 lb (123.832 kg), SpO2 95 %.  The patient had no acute events since last update.  Calm and cooperative at this time.  Disposition is pending per Psychiatry/Behavioral Medicine team recommendations.     Paulette Blanch, MD 01/01/15 [phone]

## 2015-01-01 NOTE — ED Notes (Signed)
Report from Rebecca, RN

## 2015-01-01 NOTE — ED Notes (Signed)
Dr.Clapacs at bedside  

## 2015-01-02 ENCOUNTER — Other Ambulatory Visit: Payer: Self-pay

## 2015-01-02 DIAGNOSIS — F329 Major depressive disorder, single episode, unspecified: Secondary | ICD-10-CM | POA: Diagnosis not present

## 2015-01-02 LAB — GLUCOSE, CAPILLARY
GLUCOSE-CAPILLARY: 177 mg/dL — AB (ref 65–99)
GLUCOSE-CAPILLARY: 146 mg/dL — AB (ref 65–99)
GLUCOSE-CAPILLARY: 188 mg/dL — AB (ref 65–99)
Glucose-Capillary: 192 mg/dL — ABNORMAL HIGH (ref 65–99)

## 2015-01-02 MED ORDER — LISINOPRIL 20 MG PO TABS
ORAL_TABLET | ORAL | Status: AC
  Filled 2015-01-02: qty 1

## 2015-01-02 MED ORDER — VERAPAMIL HCL 40 MG PO TABS
ORAL_TABLET | ORAL | Status: AC
  Administered 2015-01-02: 40 mg via ORAL
  Filled 2015-01-02: qty 1

## 2015-01-02 MED ORDER — GLIPIZIDE 10 MG PO TABS
ORAL_TABLET | ORAL | Status: AC
  Administered 2015-01-02: 10 mg via ORAL
  Filled 2015-01-02: qty 1

## 2015-01-02 MED ORDER — TRAMADOL HCL 50 MG PO TABS
ORAL_TABLET | ORAL | Status: AC
  Administered 2015-01-02: 50 mg via ORAL
  Filled 2015-01-02: qty 1

## 2015-01-02 MED ORDER — ACETAMINOPHEN 325 MG PO TABS
650.0000 mg | ORAL_TABLET | Freq: Once | ORAL | Status: AC
  Administered 2015-01-02: 650 mg via ORAL
  Filled 2015-01-02: qty 2

## 2015-01-02 MED ORDER — FLUPHENAZINE HCL 5 MG PO TABS
ORAL_TABLET | ORAL | Status: AC
  Administered 2015-01-02: 10 mg via ORAL
  Filled 2015-01-02: qty 2

## 2015-01-02 MED ORDER — LINAGLIPTIN 5 MG PO TABS
ORAL_TABLET | ORAL | Status: AC
  Administered 2015-01-02: 5 mg via ORAL
  Filled 2015-01-02: qty 1

## 2015-01-02 MED ORDER — ATORVASTATIN CALCIUM 20 MG PO TABS
ORAL_TABLET | ORAL | Status: AC
  Administered 2015-01-02: 20 mg via ORAL
  Filled 2015-01-02: qty 1

## 2015-01-02 MED ORDER — DOCUSATE SODIUM 100 MG PO CAPS
ORAL_CAPSULE | ORAL | Status: AC
  Administered 2015-01-02: 100 mg via ORAL
  Filled 2015-01-02: qty 1

## 2015-01-02 MED ORDER — IBUPROFEN 400 MG PO TABS
400.0000 mg | ORAL_TABLET | Freq: Once | ORAL | Status: AC
  Administered 2015-01-02: 400 mg via ORAL
  Filled 2015-01-02: qty 1

## 2015-01-02 MED ORDER — GLIPIZIDE 10 MG PO TABS
ORAL_TABLET | ORAL | Status: AC
  Filled 2015-01-02: qty 1

## 2015-01-02 NOTE — ED Notes (Signed)
NAD noted at this time. Pt states she is still hurting at this time. Pt calm and cooperative with staff at this time. Will continue with 15 min safety checks.

## 2015-01-02 NOTE — ED Notes (Signed)
NAD noted at this time. Pt resting in chair at this time with lights and TV on. Will continue with 15 min safety rounds at this time.

## 2015-01-02 NOTE — ED Notes (Signed)
NAD noted at this time. Pt resting in bed with her eyes closed and her lights off. Respirations noted to be even and unlabored at this time.

## 2015-01-02 NOTE — ED Notes (Signed)
NAD noted at this time. Pt resting in bed. Pt continues to be hyper-religious at this time. Pt easily redirected at this time. Will continue with 15 minute safety checks.

## 2015-01-02 NOTE — ED Notes (Signed)
Pt requests chaplain at this time. Pt denies any other needs, remains hyper-religious at this time. Pt ambulatory around the room at this time.

## 2015-01-02 NOTE — ED Notes (Signed)
Pt given shower and linen change. NAD noted at this time. Pt placed in recliner with Bible within reach. Pt continues to call out and cry when she sees staff but is cooperative when addressed.

## 2015-01-02 NOTE — ED Notes (Signed)
NAD Noted at this time. Pt resting in chair at this time. Pt calm and cooperative at this time, will still call out for Jesus and want to be read to from the bible.

## 2015-01-02 NOTE — ED Notes (Signed)
Pt compliant with medications at this time. NAD noted. Will continue with 15 min safety checks at this time.

## 2015-01-02 NOTE — Consult Note (Signed)
  Psychiatry: Follow-up for this 69 year old woman with schizoaffective disorder. Patient is absolutely at her baseline and is absolutely psychiatrically stable for placement in the community.

## 2015-01-02 NOTE — ED Provider Notes (Signed)
-----------------------------------------   6:57 AM on 01/02/2015 -----------------------------------------   Blood pressure 137/108, pulse 83, temperature 97.6 F (36.4 C), temperature source Oral, resp. rate 18, height 5\' 6"  (1.676 m), weight 273 lb (123.832 kg), SpO2 97 %.  The patient had no acute events since last update.  Calm and cooperative at this time.  Disposition is pending per Psychiatry/Behavioral Medicine team recommendations.     Paulette Blanch, MD 01/02/15 [phone]

## 2015-01-02 NOTE — ED Notes (Signed)
ED BHU [place] Is the patient under IVC or is there intent for IVC: Yes.   Is the patient medically cleared: Yes.   Is there vacancy in the ED BHU: Yes.   Is the population mix appropriate for patient: No. Is the patient awaiting placement in inpatient or outpatient setting: No. Has the patient had a psychiatric consult: Yes.   Survey of unit performed for contraband, proper placement and condition of furniture, tampering with fixtures in bathroom, shower, and each patient room: Yes.  ; Findings: Unremarkable APPEARANCE/BEHAVIOR Pt has periods of calling out and intermittent crying. Pt is noted to be hyper religious and will get on her hands and knees to pray and cry.  NEURO ASSESSMENT Orientation: person Hallucinations: No.None noted (Hallucinations) Speech: Normal Gait: normal RESPIRATORY ASSESSMENT Normal expansion.  Clear to auscultation.  No rales, rhonchi, or wheezing. CARDIOVASCULAR ASSESSMENT regular rate and rhythm, S1, S2 normal, no murmur, click, rub or gallop GASTROINTESTINAL ASSESSMENT soft, nontender, BS WNL, no r/g EXTREMITIES normal strength, tone, and muscle mass PLAN OF CARE Provide calm/safe environment. Vital signs assessed twice daily. ED BHU Assessment once each 12-hour shift. Collaborate with intake RN daily or as condition indicates. Assure the ED provider has rounded once each shift. Provide and encourage hygiene. Provide redirection as needed. Assess for escalating behavior; address immediately and inform ED provider.  Assess family dynamic and appropriateness for visitation as needed: Yes.  ; If necessary, describe findings:  Educate the patient/family about BHU procedures/visitation: Yes.  ; If necessary, describe findings:

## 2015-01-02 NOTE — ED Notes (Signed)
NAD noted at this time. Pt resting in bed at this time. Pt easily directed at this time. Will continue with 15 min safety checks at this time.

## 2015-01-03 DIAGNOSIS — F329 Major depressive disorder, single episode, unspecified: Secondary | ICD-10-CM | POA: Diagnosis not present

## 2015-01-03 LAB — TROPONIN I

## 2015-01-03 LAB — GLUCOSE, CAPILLARY
GLUCOSE-CAPILLARY: 129 mg/dL — AB (ref 65–99)
Glucose-Capillary: 119 mg/dL — ABNORMAL HIGH (ref 65–99)
Glucose-Capillary: 145 mg/dL — ABNORMAL HIGH (ref 65–99)
Glucose-Capillary: 152 mg/dL — ABNORMAL HIGH (ref 65–99)

## 2015-01-03 MED ORDER — VERAPAMIL HCL 40 MG PO TABS
ORAL_TABLET | ORAL | Status: AC
  Filled 2015-01-03: qty 1

## 2015-01-03 MED ORDER — DOCUSATE SODIUM 100 MG PO CAPS
ORAL_CAPSULE | ORAL | Status: AC
  Filled 2015-01-03: qty 1

## 2015-01-03 MED ORDER — GLIPIZIDE 10 MG PO TABS
ORAL_TABLET | ORAL | Status: AC
  Administered 2015-01-05: 10 mg via ORAL
  Filled 2015-01-03: qty 1

## 2015-01-03 MED ORDER — DOCUSATE SODIUM 100 MG PO CAPS: 100.0000 mg | ORAL_CAPSULE | Freq: Once | ORAL | Status: AC

## 2015-01-03 MED ORDER — LINAGLIPTIN 5 MG PO TABS
ORAL_TABLET | ORAL | Status: AC
  Filled 2015-01-03: qty 1

## 2015-01-03 MED ORDER — TRAMADOL HCL 50 MG PO TABS
ORAL_TABLET | ORAL | Status: AC
  Filled 2015-01-03: qty 1

## 2015-01-03 MED ORDER — LISINOPRIL 20 MG PO TABS
20.0000 mg | ORAL_TABLET | Freq: Every day | ORAL | Status: DC
  Administered 2015-01-04 – 2015-01-08 (×5): 20 mg via ORAL
  Filled 2015-01-03 (×4): qty 1

## 2015-01-03 MED ORDER — FUROSEMIDE 40 MG PO TABS
ORAL_TABLET | ORAL | Status: AC
  Filled 2015-01-03: qty 1

## 2015-01-03 MED ORDER — LISINOPRIL 20 MG PO TABS
ORAL_TABLET | ORAL | Status: AC
  Filled 2015-01-03: qty 1

## 2015-01-03 MED ORDER — ACETAMINOPHEN 325 MG PO TABS
ORAL_TABLET | ORAL | Status: AC
  Administered 2015-01-03: 650 mg
  Filled 2015-01-03: qty 2

## 2015-01-03 MED ORDER — GLIPIZIDE 10 MG PO TABS
ORAL_TABLET | ORAL | Status: AC
  Filled 2015-01-03: qty 1

## 2015-01-03 NOTE — ED Notes (Signed)
BEHAVIORAL HEALTH ROUNDING Patient sleeping: Yes.   Patient alert and oriented: yes Behavior appropriate: Yes.  ; If no, describe:  Nutrition and fluids offered: Yes  Toileting and hygiene offered: Yes  Sitter present: no Law enforcement present: Yes  

## 2015-01-03 NOTE — ED Notes (Signed)
Patient assigned to appropriate care area. Patient oriented to unit/care area: Informed that, for their safety, care areas are designed for safety and monitored by security cameras at all times; and visiting hours explained to patient. Patient verbalizes understanding, and verbal contract for safety obtained. 

## 2015-01-03 NOTE — ED Notes (Signed)
Pt sleeping, resps unlabored.  

## 2015-01-03 NOTE — ED Notes (Signed)
BEHAVIORAL HEALTH ROUNDING Patient sleeping: No. Patient alert and oriented: yes Behavior appropriate: Yes.  ; If no, describe:  Nutrition and fluids offered: Yes  Toileting and hygiene offered: Yes  Sitter present: no Law enforcement present: Yes  

## 2015-01-03 NOTE — ED Notes (Signed)
BEHAVIORAL HEALTH ROUNDING Patient sleeping: No. Patient alert and oriented: yes Behavior appropriate: Yes.  ; If no, describe: at her normal Nutrition and fluids offered: Yes  Toileting and hygiene offered: Yes  Sitter present: no Law enforcement present: Yes

## 2015-01-03 NOTE — Consult Note (Signed)
  Psychiatry: Yet another day past with this patient sitting in the emergency room despite being completely at her psychiatric baseline. Supportive counseling completed. No change to psychiatric medicine. Her blood pressure does continue to stay disturbingly up and I think I will increase some of her medication. No other change to treatment plan. We apparently have no control over when the state will authorize her appropriate placement and continue to wait while leaving the patient stranded in the emergency room.

## 2015-01-03 NOTE — ED Provider Notes (Signed)
Last night patient had chest pain but was ruled out for ACS. She remains medically stable for psychiatric disposition  [name] Newport, MD 01/03/15 [phone]

## 2015-01-03 NOTE — ED Notes (Signed)

## 2015-01-03 NOTE — ED Notes (Signed)
Pt reports "I burped and my chest don't hurt no more."

## 2015-01-03 NOTE — Progress Notes (Signed)
Call to patient's son [name] [phone] to provide an update on the waiting process for patient's PASARR #.  Per son, he spoke to the representative from [facility] when they visited with patient last week.  The additional information that was requested has been faxed to [facility].   CSW will continue to assist with ongoing and discharge need in anticipation of patient discharging to [facility] once the PASARR is received.  Casimer Lanius. Latanya Presser, MSW Clinical Social Work Department [phone] 5:43 PM

## 2015-01-03 NOTE — ED Notes (Addendum)
Pt up to restroom to void. Pt requesting chaplain for "blessing". Chaplain called for pt. Breath sounds clear in all lobes laterally and posteriorly.

## 2015-01-03 NOTE — ED Notes (Signed)
Chaplain visiting with pt.  

## 2015-01-03 NOTE — ED Notes (Signed)
Pt provided with snack and po fluids, pt ambulatory around treatment room without difficulty.

## 2015-01-03 NOTE — ED Notes (Signed)
BEHAVIORAL HEALTH ROUNDING Patient sleeping: No. Patient alert and oriented: not applicable Behavior appropriate: Yes.  ; If no, describe:  Nutrition and fluids offered: Yes  Toileting and hygiene offered: Yes  Sitter present: no Law enforcement present: Yes  

## 2015-01-03 NOTE — Progress Notes (Signed)
   01/03/15 2000  Clinical Encounter Type  Visited With Patient  Visit Type Spiritual support  Referral From Patient  Consult/Referral To Chaplain  Spiritual Encounters  Spiritual Needs Prayer;Emotional  Requested by patient to visit and anoint with holy water. I blessed some water and anointed her head. I left the water for her to continue to use. We read Psalms and prayed before I left. [place]

## 2015-01-03 NOTE — ED Provider Notes (Addendum)
Assumed care 11 PM to 7 AM 01/02/15.  Nurse noted the patient complained of chest pain. Patient on my exam has normal exam and is no longer complaining of chest pain. She is older, and her EKG is nonspecific, and so I have added troponin as well as repeat level of troponin.  Initial troponin negative. Repeat troponin negative.  ECG read and interpreted by myself, attending physician Dr. Lisa Roca Time 23:22 70 bpm. Normal sinus rhythm. Narrow QRS. Normal axis. Nonspecific ST and T-wave.    Lisa Roca, MD 01/03/15 [id number]  Lisa Roca, MD 01/03/15 [phone]

## 2015-01-03 NOTE — ED Notes (Signed)
Pt awoke easily for verapamil administration and vital sign obtained.

## 2015-01-03 NOTE — Progress Notes (Signed)
   01/03/15 1230  Clinical Encounter Type  Visited With Family  Visit Type Follow-up;Spiritual support  Referral From Nurse  Consult/Referral To Chaplain  Spiritual Encounters  Spiritual Needs Prayer;Sacred text  Was requested by patient to visit and anoint with oil. I read various Psalms and anointed her head and hands with oil. Prayed before I left.  [place]

## 2015-01-04 DIAGNOSIS — F329 Major depressive disorder, single episode, unspecified: Secondary | ICD-10-CM | POA: Diagnosis not present

## 2015-01-04 LAB — GLUCOSE, CAPILLARY
Glucose-Capillary: 164 mg/dL — ABNORMAL HIGH (ref 65–99)
Glucose-Capillary: 107 mg/dL — ABNORMAL HIGH (ref 65–99)
Glucose-Capillary: 175 mg/dL — ABNORMAL HIGH (ref 65–99)

## 2015-01-04 MED ORDER — VERAPAMIL HCL 40 MG PO TABS
ORAL_TABLET | ORAL | Status: AC
  Filled 2015-01-04: qty 1

## 2015-01-04 MED ORDER — VERAPAMIL HCL 40 MG PO TABS
ORAL_TABLET | ORAL | Status: AC
  Administered 2015-01-04: 40 mg via ORAL
  Filled 2015-01-04: qty 1

## 2015-01-04 MED ORDER — ACETAMINOPHEN 325 MG PO TABS
ORAL_TABLET | ORAL | Status: AC
  Administered 2015-01-04: 650 mg via ORAL
  Filled 2015-01-04: qty 2

## 2015-01-04 MED ORDER — DOCUSATE SODIUM 100 MG PO CAPS
ORAL_CAPSULE | ORAL | Status: AC
  Administered 2015-01-04: 100 mg via ORAL
  Filled 2015-01-04: qty 1

## 2015-01-04 MED ORDER — FLUPHENAZINE HCL 5 MG PO TABS
ORAL_TABLET | ORAL | Status: AC
  Filled 2015-01-04: qty 2

## 2015-01-04 MED ORDER — ATORVASTATIN CALCIUM 20 MG PO TABS
ORAL_TABLET | ORAL | Status: AC
  Filled 2015-01-04: qty 1

## 2015-01-04 MED ORDER — GLIPIZIDE 10 MG PO TABS
ORAL_TABLET | ORAL | Status: AC
  Administered 2015-01-04: 10 mg via ORAL
  Filled 2015-01-04: qty 1

## 2015-01-04 MED ORDER — LISINOPRIL 20 MG PO TABS
ORAL_TABLET | ORAL | Status: AC
  Administered 2015-01-04: 20 mg via ORAL
  Filled 2015-01-04: qty 1

## 2015-01-04 MED ORDER — LINAGLIPTIN 5 MG PO TABS
ORAL_TABLET | ORAL | Status: AC
  Administered 2015-01-04: 5 mg via ORAL
  Filled 2015-01-04: qty 1

## 2015-01-04 NOTE — ED Notes (Signed)
Pt sleeping, resps unlabored.  

## 2015-01-04 NOTE — ED Notes (Signed)
Pt continues to sleep, resps unlabored.  

## 2015-01-04 NOTE — Progress Notes (Signed)
Call to patient's ACT team [facility] after hours [phone] spoke with [name] RN to discuss continuation of care for patient with ACT team while patient is in the ED.  Per [name] she has spoken with [facility] Jenell Milliner who verified that she had come to visit patient while in the ED and would continue to do so until she is placed at [facility].  Casimer Lanius. Latanya Presser, MSW Clinical Social Work Department [phone] 12:56 PM

## 2015-01-04 NOTE — ED Notes (Signed)
Ivc/placement pending

## 2015-01-04 NOTE — ED Provider Notes (Signed)
-----------------------------------------   5:50 AM on 01/04/2015 -----------------------------------------   Blood pressure 139/92, pulse 74, temperature 97.5 F (36.4 C), temperature source Oral, resp. rate 16, height 5\' 6"  (1.676 m), weight 273 lb (123.832 kg), SpO2 97 %.  The patient had no acute events since last update.  Calm and cooperative at this time.  Disposition is pending per Psychiatry/Behavioral Medicine team recommendations.     Carrie Mew, MD 01/04/15 [phone]

## 2015-01-04 NOTE — ED Notes (Signed)
Pt awake and up to restroom

## 2015-01-04 NOTE — Progress Notes (Signed)
   01/04/15 1100  Clinical Encounter Type  Visited With Patient  Visit Type Follow-up  Referral From Nurse  Spiritual Encounters  Spiritual Needs Prayer  Prayed with patient and offered support and care. Followed up with patient from on call chaplain.

## 2015-01-04 NOTE — ED Notes (Signed)
Pt resting in bed with eyes closed. resps unlabored.

## 2015-01-04 NOTE — ED Notes (Signed)
Pt in room crying, agitated, talking about and to jesus. Had pt read from bible. Pt less agitated post read.

## 2015-01-04 NOTE — ED Notes (Signed)
Report to bill, rn. Pt sleeping.

## 2015-01-05 DIAGNOSIS — F329 Major depressive disorder, single episode, unspecified: Secondary | ICD-10-CM | POA: Diagnosis not present

## 2015-01-05 LAB — URINALYSIS COMPLETE WITH MICROSCOPIC (ARMC ONLY)
BILIRUBIN URINE: NEGATIVE
GLUCOSE, UA: NEGATIVE mg/dL
KETONES UR: NEGATIVE mg/dL
Nitrite: NEGATIVE
Protein, ur: NEGATIVE mg/dL
SPECIFIC GRAVITY, URINE: 1.001 — AB (ref 1.005–1.030)
pH: 7 (ref 5.0–8.0)

## 2015-01-05 LAB — GLUCOSE, CAPILLARY
GLUCOSE-CAPILLARY: 62 mg/dL — AB (ref 65–99)
GLUCOSE-CAPILLARY: 219 mg/dL — AB (ref 65–99)
Glucose-Capillary: 103 mg/dL — ABNORMAL HIGH (ref 65–99)
Glucose-Capillary: 154 mg/dL — ABNORMAL HIGH (ref 65–99)

## 2015-01-05 MED ORDER — VERAPAMIL HCL 40 MG PO TABS
ORAL_TABLET | ORAL | Status: AC
  Administered 2015-01-05: 40 mg via ORAL
  Filled 2015-01-05: qty 1

## 2015-01-05 MED ORDER — ATORVASTATIN CALCIUM 20 MG PO TABS
ORAL_TABLET | ORAL | Status: AC
  Filled 2015-01-05: qty 1

## 2015-01-05 MED ORDER — LISINOPRIL 20 MG PO TABS
ORAL_TABLET | ORAL | Status: AC
  Administered 2015-01-05: 20 mg via ORAL
  Filled 2015-01-05: qty 1

## 2015-01-05 MED ORDER — CEPHALEXIN 500 MG PO CAPS
500.0000 mg | ORAL_CAPSULE | Freq: Two times a day (BID) | ORAL | Status: DC
  Administered 2015-01-05 – 2015-01-08 (×7): 500 mg via ORAL
  Filled 2015-01-05 (×5): qty 1

## 2015-01-05 MED ORDER — VERAPAMIL HCL 40 MG PO TABS
ORAL_TABLET | ORAL | Status: AC
  Filled 2015-01-05: qty 1

## 2015-01-05 MED ORDER — ACETAMINOPHEN 325 MG PO TABS
ORAL_TABLET | ORAL | Status: AC
  Administered 2015-01-05: 650 mg via ORAL
  Filled 2015-01-05: qty 2

## 2015-01-05 MED ORDER — GLIPIZIDE 10 MG PO TABS
ORAL_TABLET | ORAL | Status: AC
  Administered 2015-01-05: 10 mg via ORAL
  Filled 2015-01-05: qty 1

## 2015-01-05 MED ORDER — DOCUSATE SODIUM 100 MG PO CAPS
ORAL_CAPSULE | ORAL | Status: AC
  Administered 2015-01-05: 100 mg via ORAL
  Filled 2015-01-05: qty 1

## 2015-01-05 MED ORDER — GLIPIZIDE 10 MG PO TABS
ORAL_TABLET | ORAL | Status: AC
  Filled 2015-01-05: qty 3

## 2015-01-05 MED ORDER — LINAGLIPTIN 5 MG PO TABS
ORAL_TABLET | ORAL | Status: AC
  Administered 2015-01-05: 5 mg via ORAL
  Filled 2015-01-05: qty 1

## 2015-01-05 NOTE — ED Notes (Signed)
Patient reported burning with urination. Order for urinalysis entered per protocol and collected. Dr. Edd Fabian informed.

## 2015-01-05 NOTE — Progress Notes (Signed)
Site visit by patient's mental health provider [facility] [name] Psych RN).  Patient was excited and pleased to see [name].    [name] [name]. [name] [name], MSW Clinical Social Work Department [phone] 2:04 PM

## 2015-01-05 NOTE — ED Provider Notes (Signed)
-----------------------------------------   6:56 AM on 01/05/2015 -----------------------------------------   Blood pressure 157/92, pulse 80, temperature 98.4 F (36.9 C), temperature source Oral, resp. rate 20, height 5\' 6"  (1.676 m), weight 273 lb (123.832 kg), SpO2 96 %.  The patient had no acute events since last update. Patient complains of dysuria and urinalysis today is 2+ leukesterase, multiple white blood cells therefore will start on Keflex for urinary tract infection. Urine culture sent. Patient calm and cooperative at this time.  Disposition is pending per Psychiatry/Behavioral Medicine team recommendations.     Joanne Gavel, MD 01/05/15 [phone]

## 2015-01-05 NOTE — ED Notes (Signed)
Pt requesting number [phone] be written down- her Minister.

## 2015-01-05 NOTE — ED Notes (Signed)
Breakfast given.  

## 2015-01-05 NOTE — ED Notes (Signed)
Pt escorted to behavioral med shower by this tech and officer Glo Herring)

## 2015-01-05 NOTE — Progress Notes (Signed)
Followup with [name].  She had showered and appeared to have gotten some rest.  Read Scripture and prayed. [place] 1200

## 2015-01-05 NOTE — ED Notes (Signed)
Pt ate all of breakfast.  

## 2015-01-06 DIAGNOSIS — F329 Major depressive disorder, single episode, unspecified: Secondary | ICD-10-CM | POA: Diagnosis not present

## 2015-01-06 LAB — URINE CULTURE

## 2015-01-06 LAB — GLUCOSE, CAPILLARY
Glucose-Capillary: 107 mg/dL — ABNORMAL HIGH (ref 65–99)
Glucose-Capillary: 184 mg/dL — ABNORMAL HIGH (ref 65–99)

## 2015-01-06 MED ORDER — VERAPAMIL HCL 40 MG PO TABS
ORAL_TABLET | ORAL | Status: AC
  Administered 2015-01-06: 40 mg via ORAL
  Filled 2015-01-06: qty 1

## 2015-01-06 MED ORDER — VERAPAMIL HCL 40 MG PO TABS
ORAL_TABLET | ORAL | Status: AC
  Filled 2015-01-06: qty 1

## 2015-01-06 MED ORDER — FLUPHENAZINE HCL 5 MG PO TABS
ORAL_TABLET | ORAL | Status: AC
  Administered 2015-01-06: 10 mg via ORAL
  Filled 2015-01-06: qty 2

## 2015-01-06 MED ORDER — ACETAMINOPHEN 500 MG PO TABS
ORAL_TABLET | ORAL | Status: DC
Start: 2015-01-06 — End: 2015-01-06
  Filled 2015-01-06: qty 2

## 2015-01-06 MED ORDER — FUROSEMIDE 40 MG PO TABS
ORAL_TABLET | ORAL | Status: AC
  Administered 2015-01-06: 40 mg via ORAL
  Filled 2015-01-06: qty 1

## 2015-01-06 MED ORDER — GLIPIZIDE 10 MG PO TABS
ORAL_TABLET | ORAL | Status: AC
Start: 2015-01-06 — End: 2015-01-06
  Filled 2015-01-06: qty 1

## 2015-01-06 MED ORDER — ONDANSETRON 4 MG PO TBDP
4.0000 mg | ORAL_TABLET | Freq: Once | ORAL | Status: AC
  Administered 2015-01-07: 4 mg via ORAL
  Filled 2015-01-06: qty 1

## 2015-01-06 MED ORDER — GLIPIZIDE 10 MG PO TABS
ORAL_TABLET | ORAL | Status: AC
  Filled 2015-01-06: qty 1

## 2015-01-06 MED ORDER — DOCUSATE SODIUM 100 MG PO CAPS
ORAL_CAPSULE | ORAL | Status: AC
  Administered 2015-01-06: 100 mg via ORAL
  Filled 2015-01-06: qty 1

## 2015-01-06 MED ORDER — ACETAMINOPHEN 325 MG PO TABS
ORAL_TABLET | ORAL | Status: AC
  Administered 2015-01-06: 650 mg via ORAL
  Filled 2015-01-06: qty 2

## 2015-01-06 MED ORDER — LORAZEPAM 1 MG PO TABS
1.0000 mg | ORAL_TABLET | Freq: Once | ORAL | Status: AC
  Administered 2015-01-07: 1 mg via ORAL
  Filled 2015-01-06: qty 1

## 2015-01-06 MED ORDER — ATORVASTATIN CALCIUM 20 MG PO TABS
ORAL_TABLET | ORAL | Status: AC
  Administered 2015-01-06: 20 mg via ORAL
  Filled 2015-01-06: qty 1

## 2015-01-06 MED ORDER — CEPHALEXIN 500 MG PO CAPS
ORAL_CAPSULE | ORAL | Status: AC
  Administered 2015-01-06: 500 mg via ORAL
  Filled 2015-01-06: qty 1

## 2015-01-06 MED ORDER — LINAGLIPTIN 5 MG PO TABS
ORAL_TABLET | ORAL | Status: AC
  Administered 2015-01-06: 5 mg via ORAL
  Filled 2015-01-06: qty 1

## 2015-01-06 MED ORDER — ACETAMINOPHEN 325 MG PO TABS
ORAL_TABLET | ORAL | Status: AC
  Filled 2015-01-06: qty 2

## 2015-01-06 NOTE — ED Notes (Signed)
Report to stephanie, rn

## 2015-01-06 NOTE — ED Provider Notes (Signed)
-----------------------------------------   6:36 AM on 01/06/2015 -----------------------------------------   Blood pressure 147/88, pulse 82, temperature 98.4 F (36.9 C), temperature source Oral, resp. rate 20, height 5\' 6"  (1.676 m), weight 273 lb (123.832 kg), SpO2 96 %.  The patient had no acute events since last update.  Calm and cooperative at this time.  Disposition is pending per Psychiatry/Behavioral Medicine team recommendations.     Paulette Blanch, MD 01/06/15 [phone]

## 2015-01-06 NOTE — ED Notes (Signed)
ED BHU [place] Is the patient under IVC or is there intent for IVC: Yes.   Is the patient medically cleared: Yes.   Is there vacancy in the ED BHU: Yes.   Is the population mix appropriate for patient: No. Is the patient awaiting placement in inpatient or outpatient setting: Yes.   Has the patient had a psychiatric consult: Yes.   Survey of unit performed for contraband, proper placement and condition of furniture, tampering with fixtures in bathroom, shower, and each patient room: Yes.  ; Findings:  APPEARANCE/BEHAVIOR calm and cooperative NEURO ASSESSMENT Orientation: time, place and person Hallucinations: No.None noted (Hallucinations) Speech: Normal Gait: unsteady RESPIRATORY ASSESSMENT Normal expansion.  Clear to auscultation.  No rales, rhonchi, or wheezing. CARDIOVASCULAR ASSESSMENT regular rate and rhythm, S1, S2 normal, no murmur, click, rub or gallop GASTROINTESTINAL ASSESSMENT soft, nontender, BS WNL, no r/g EXTREMITIES normal strength, tone, and muscle mass PLAN OF CARE Provide calm/safe environment. Vital signs assessed twice daily. ED BHU Assessment once each 12-hour shift. Collaborate with intake RN daily or as condition indicates. Assure the ED provider has rounded once each shift. Provide and encourage hygiene. Provide redirection as needed. Assess for escalating behavior; address immediately and inform ED provider.  Assess family dynamic and appropriateness for visitation as needed: No.; If necessary, describe findings: family unavailable at this time Educate the patient/family about BHU procedures/visitation: No.; If necessary, describe findings: family unavailable at this time

## 2015-01-06 NOTE — ED Notes (Signed)
Verapamil 40mg  po administered.

## 2015-01-06 NOTE — Consult Note (Signed)
  Psychiatry: Follow-up for this patient with schizoaffective disorder who has been waiting an extraordinarily long time in the emergency room just for the state to issue the correct paperwork so that she can be placed. Patient has no new complaints today. Continues to take the whole situation remarkably pleasantly. No new physical complaint either. Supportive counseling done. No change to medicine.

## 2015-01-06 NOTE — ED Notes (Signed)
Pt ambulatory without difficulty. Pt with diminished lower lobe breath sounds, clear in upper lobes. Pt denies pain. Pt alert to self, place, unsure of date or situation leading to hospital admission. Skin normal color warm and dry. Moist oral mucus membranes.

## 2015-01-06 NOTE — ED Notes (Signed)
Pt up to commode for bowel movement.  

## 2015-01-06 NOTE — ED Notes (Signed)
Pt escorted to [facility] for shower

## 2015-01-06 NOTE — ED Notes (Signed)
Pt in bed sleeping. resps unlabored.

## 2015-01-06 NOTE — ED Notes (Signed)
Pt awake sitting in recliner chair. Pt provided with water.

## 2015-01-06 NOTE — ED Notes (Signed)
Pt eating lunch at present. 

## 2015-01-06 NOTE — ED Notes (Signed)
Pt sleeping, resps unlabored.  

## 2015-01-07 DIAGNOSIS — F329 Major depressive disorder, single episode, unspecified: Secondary | ICD-10-CM | POA: Diagnosis not present

## 2015-01-07 DIAGNOSIS — F250 Schizoaffective disorder, bipolar type: Secondary | ICD-10-CM | POA: Diagnosis not present

## 2015-01-07 LAB — GLUCOSE, CAPILLARY: GLUCOSE-CAPILLARY: 110 mg/dL — AB (ref 65–99)

## 2015-01-07 MED ORDER — DOCUSATE SODIUM 100 MG PO CAPS
ORAL_CAPSULE | ORAL | Status: AC
  Administered 2015-01-07: 100 mg via ORAL
  Filled 2015-01-07: qty 1

## 2015-01-07 MED ORDER — ATORVASTATIN CALCIUM 20 MG PO TABS
ORAL_TABLET | ORAL | Status: AC
  Administered 2015-01-07: 20 mg via ORAL
  Filled 2015-01-07: qty 1

## 2015-01-07 MED ORDER — GLIPIZIDE 10 MG PO TABS
ORAL_TABLET | ORAL | Status: AC
  Filled 2015-01-07: qty 1

## 2015-01-07 MED ORDER — VERAPAMIL HCL 40 MG PO TABS
ORAL_TABLET | ORAL | Status: AC
  Filled 2015-01-07: qty 1

## 2015-01-07 MED ORDER — GLIPIZIDE 10 MG PO TABS
ORAL_TABLET | ORAL | Status: AC
  Administered 2015-01-07: 10 mg via ORAL
  Filled 2015-01-07: qty 1

## 2015-01-07 MED ORDER — VERAPAMIL HCL 40 MG PO TABS
ORAL_TABLET | ORAL | Status: AC
  Administered 2015-01-07: 40 mg via ORAL
  Filled 2015-01-07: qty 1

## 2015-01-07 MED ORDER — FLUPHENAZINE HCL 5 MG PO TABS
ORAL_TABLET | ORAL | Status: AC
  Administered 2015-01-07: 10 mg via ORAL
  Filled 2015-01-07: qty 2

## 2015-01-07 MED ORDER — LINAGLIPTIN 5 MG PO TABS
ORAL_TABLET | ORAL | Status: AC
  Administered 2015-01-07: 5 mg via ORAL
  Filled 2015-01-07: qty 1

## 2015-01-07 NOTE — ED Notes (Signed)
BEHAVIORAL HEALTH ROUNDING Patient sleeping: Yes.   Patient alert and oriented: eyes closed  Appears asleep Behavior appropriate: Yes.  ; If no, describe:  Nutrition and fluids offered: Yes  Toileting and hygiene offered: sleeping Sitter present: q 15 minute observations and security camera monitoring Law enforcement present: yes  [place] 

## 2015-01-07 NOTE — ED Notes (Signed)

## 2015-01-07 NOTE — ED Notes (Signed)

## 2015-01-07 NOTE — Progress Notes (Signed)
Pt was re-evaluate by state PASRR today for appropriateness for assisted living level placement. CSW provided extensive notes documenting Pt's stability while in ED. Evaluator stated that she will have her report to [facility] today for possible decision on Wednesday. Pt can DC to [facility] once PASRR is received.   Toma Copier, [place]

## 2015-01-07 NOTE — ED Notes (Signed)

## 2015-01-07 NOTE — ED Notes (Signed)
BEHAVIORAL HEALTH ROUNDING Patient sleeping: No. Patient alert and oriented: yes Behavior appropriate: Yes.  ; If no, describe:  Nutrition and fluids offered: yes Toileting and hygiene offered: Yes  Sitter present: q15 minute observations and security camera monitoring Law enforcement present: Yes  ODS  

## 2015-01-07 NOTE — ED Notes (Signed)
She is awake and has ambulated to the BR  She is currently trying to call her brother on the phone   Pt observed with no unusual behavior  Appropriate to stimulation  No verbalized needs or concerns at this time  NAD assessed  Continue to monitor

## 2015-01-07 NOTE — ED Notes (Signed)
Pt observed sitting in the recliner in her room  NAD observed  She is smiling and states  "Hey Tammey Deeg - i am better - I feel good - I am not taking that much medicine either."  Pt reassured and encouraged   Continue to monitor

## 2015-01-07 NOTE — ED Notes (Signed)
She has ambulated to the BR and back - stopping and smiling and speaking to everyone that she sees - breakfast provided  She is sitting up in the recliner while she eats  VSS  Assessment completed  Continue to monitor

## 2015-01-07 NOTE — ED Notes (Signed)
Pm meds administered as ordered

## 2015-01-07 NOTE — ED Provider Notes (Signed)
Patient's commitment papers are running out. Patient still is having paranoia and is still unable to care for herself. Patient still awaiting placement. I will renew the commitment papers.  Nena Polio, MD 01/07/15 [phone]

## 2015-01-07 NOTE — ED Notes (Signed)
Pt to the chair for her meal  - supper provided along with an extra drink  Pt observed with no unusual behavior  Appropriate to stimulation  No verbalized needs or concerns at this time  NAD assessed  Continue to monitor

## 2015-01-07 NOTE — Progress Notes (Signed)
   01/07/15 1700  Clinical Encounter Type  Visited With Patient  Visit Type Follow-up  Referral From Chaplain  Consult/Referral To Chaplain  Spiritual Encounters  Spiritual Needs Prayer;Sacred text;Ritual  Met w/patient to provide pastoral care & prayer.  Wandra Arthurs. [place]

## 2015-01-07 NOTE — ED Notes (Signed)
She is currently lying in bed with her eyes closed  - sleeping soundly - snoring can be heard  NAD observed continue to monitor

## 2015-01-07 NOTE — ED Notes (Signed)
Tech assisted her back tp bed and she is lying in bed with her eyes closed   Continue to monitor

## 2015-01-07 NOTE — ED Notes (Signed)
Am meds administered as ordered  Lunch provided along with an extra drink   The chaplin has visited her and she has multiple verses that she is going to look up  Pt observed with no unusual behavior  Appropriate to stimulation  No verbalized needs or concerns at this time  NAD assessed  Continue to monitor

## 2015-01-07 NOTE — ED Notes (Signed)
She has laid back down in the bed  NAD observed  Continue to monitor

## 2015-01-07 NOTE — ED Provider Notes (Signed)
-----------------------------------------   3:04 PM on 01/07/2015 -----------------------------------------   Blood pressure 124/76, pulse 83, temperature 97.6 F (36.4 C), temperature source Oral, resp. rate 18, height 5\' 6"  (1.676 m), weight 273 lb (123.832 kg), SpO2 94 %.  The patient had no acute events since last update.  Calm and cooperative at this time.  Disposition is pending per Psychiatry/Behavioral Medicine team recommendations.     Nena Polio, MD 01/07/15 [phone]

## 2015-01-07 NOTE — Consult Note (Signed)
  Psychiatry: Follow-up note for this 69 year old woman with schizoaffective disorder who is spending yet another day parked in the emergency room despite being completely stable. Patient is pleasant and is taking things well. Vital signs are stable. No physical complaints. She remains jolly and at times slightly disorganized and hyperreligious but has not been threatening or aggressive and is at her mental state baseline.  We continue to await proper paperwork so that she can be placed locally. No change to medicine at this point.

## 2015-01-08 DIAGNOSIS — F329 Major depressive disorder, single episode, unspecified: Secondary | ICD-10-CM | POA: Diagnosis not present

## 2015-01-08 LAB — GLUCOSE, CAPILLARY
GLUCOSE-CAPILLARY: 94 mg/dL (ref 65–99)
Glucose-Capillary: 143 mg/dL — ABNORMAL HIGH (ref 65–99)

## 2015-01-08 MED ORDER — FUROSEMIDE 40 MG PO TABS
ORAL_TABLET | ORAL | Status: AC
  Administered 2015-01-08: 40 mg via ORAL
  Filled 2015-01-08: qty 1

## 2015-01-08 MED ORDER — LEVETIRACETAM 500 MG PO TABS
ORAL_TABLET | ORAL | Status: AC
Start: 2015-01-08 — End: 2015-01-08
  Administered 2015-01-08: 500 mg
  Filled 2015-01-08: qty 1

## 2015-01-08 MED ORDER — LINAGLIPTIN 5 MG PO TABS
ORAL_TABLET | ORAL | Status: AC
  Filled 2015-01-08: qty 1

## 2015-01-08 MED ORDER — CEPHALEXIN 250 MG PO CAPS: 250.0000 mg | ORAL_CAPSULE | Freq: Four times a day (QID) | ORAL | Status: AC

## 2015-01-08 MED ORDER — VERAPAMIL HCL 40 MG PO TABS
ORAL_TABLET | ORAL | Status: AC
  Administered 2015-01-08: 40 mg via ORAL
  Filled 2015-01-08: qty 1

## 2015-01-08 MED ORDER — CEPHALEXIN 500 MG PO CAPS
ORAL_CAPSULE | ORAL | Status: AC
  Filled 2015-01-08: qty 1

## 2015-01-08 MED ORDER — DOCUSATE SODIUM 100 MG PO CAPS
ORAL_CAPSULE | ORAL | Status: AC
  Filled 2015-01-08: qty 1

## 2015-01-08 NOTE — Discharge Instructions (Signed)
Schizophrenia Schizophrenia is a mental illness. It may cause disturbed or disorganized thinking, speech, or behavior. People with schizophrenia have problems functioning in one or more areas of life: work, school, home, or relationships. People with schizophrenia are at increased risk for suicide, certain chronic physical illnesses, and unhealthy behaviors, such as smoking and drug use. People who have family members with schizophrenia are at higher risk of developing the illness. Schizophrenia affects men and women equally but usually appears at an earlier age (teenage or early adult years) in men.  SYMPTOMS The earliest symptoms are often subtle (prodrome) and may go unnoticed until the illness becomes more severe (first-break psychosis). Symptoms of schizophrenia may be continuous or may come and go in severity. Episodes often are triggered by major life events, such as family stress, college, [employer], marriage, pregnancy or child birth, divorce, or loss of a loved one. People with schizophrenia may see, hear, or feel things that do not exist (hallucinations). They may have false beliefs in spite of obvious proof to the contrary (delusions). Sometimes speech is incoherent or behavior is odd or withdrawn.  DIAGNOSIS Schizophrenia is diagnosed through an assessment by your caregiver. Your caregiver will ask questions about your thoughts, behavior, mood, and ability to function in daily life. Your caregiver may ask questions about your medical history and use of alcohol or drugs, including prescription medication. Your caregiver may also order blood tests and imaging exams. Certain medical conditions and substances can cause symptoms that resemble schizophrenia. Your caregiver may refer you to a mental health specialist for evaluation. There are three major criterion for a diagnosis of schizophrenia:  Two or more of the following five symptoms are present for a month or longer:  Delusions. Often  the delusions are that you are being attacked, harassed, cheated, persecuted or conspired against (persecutory delusions).  Hallucinations.   Disorganized speech that does not make sense to others.  Grossly disorganized (confused or unfocused) behavior or extremely overactive or underactive motor activity (catatonia).  Negative symptoms such as bland or blunted emotions (flat affect), loss of will power (avolition), and withdrawal from social contacts (social isolation).  Level of functioning in one or more major areas of life (work, school, relationships, or self-care) is markedly below the level of functioning before the onset of illness.   There are continuous signs of illness (either mild symptoms or decreased level of functioning) for at least 6 months or longer. TREATMENT  Schizophrenia is a long-term illness. It is best controlled with continuous treatment rather than treatment only when symptoms occur. The following treatments are used to manage schizophrenia:  Medication--Medication is the most effective and important form of treatment for schizophrenia. Antipsychotic medications are usually prescribed to help manage schizophrenia. Other types of medication may be added to relieve any symptoms that may occur despite the use of antipsychotic medications.  Counseling or talk therapy--Individual, group, or family counseling may be helpful in providing education, support, and guidance. Many people with schizophrenia also benefit from social skills and job skills (vocational) training. A combination of medication and counseling is best for managing the disorder over time. A procedure in which electricity is applied to the brain through the scalp (electroconvulsive therapy) may be used to treat catatonic schizophrenia or schizophrenia in people who cannot take or do not respond to medication and counseling.   This information is not intended to replace advice given to you by your health  care provider. Make sure you discuss any questions you have with  your health care provider.   Document Released: 02/06/2000 Document Revised: 03/01/2014 Document Reviewed: 05/03/2012 Elsevier Interactive Patient Education 2016 Elsevier Inc. Major Depressive Disorder Major depressive disorder is a mental illness. It also may be called clinical depression or unipolar depression. Major depressive disorder usually causes feelings of sadness, hopelessness, or helplessness. Some people with this disorder do not feel particularly sad but lose interest in doing things they used to enjoy (anhedonia). Major depressive disorder also can cause physical symptoms. It can interfere with work, school, relationships, and other normal everyday activities. The disorder varies in severity but is longer lasting and more serious than the sadness we all feel from time to time in our lives. Major depressive disorder often is triggered by stressful life events or major life changes. Examples of these triggers include divorce, loss of your job or home, a move, and the death of a family member or close friend. Sometimes this disorder occurs for no obvious reason at all. People who have family members with major depressive disorder or bipolar disorder are at higher risk for developing this disorder, with or without life stressors. Major depressive disorder can occur at any age. It may occur just once in your life (single episode major depressive disorder). It may occur multiple times (recurrent major depressive disorder). SYMPTOMS People with major depressive disorder have either anhedonia or depressed mood on nearly a daily basis for at least 2 weeks or longer. Symptoms of depressed mood include:  Feelings of sadness (blue or down in the dumps) or emptiness.  Feelings of hopelessness or helplessness.  Tearfulness or episodes of crying (may be observed by others).  Irritability (children and adolescents). In addition to  depressed mood or anhedonia or both, people with this disorder have at least four of the following symptoms:  Difficulty sleeping or sleeping too much.   Significant change (increase or decrease) in appetite or weight.   Lack of energy or motivation.  Feelings of guilt and worthlessness.   Difficulty concentrating, remembering, or making decisions.  Unusually slow movement (psychomotor retardation) or restlessness (as observed by others).   Recurrent wishes for death, recurrent thoughts of self-harm (suicide), or a suicide attempt. People with major depressive disorder commonly have persistent negative thoughts about themselves, other people, and the world. People with severe major depressive disorder may experiencedistorted beliefs or perceptions about the world (psychotic delusions). They also may see or hear things that are not real (psychotic hallucinations). DIAGNOSIS Major depressive disorder is diagnosed through an assessment by your health care provider. Your health care provider will ask aboutaspects of your daily life, such as mood,sleep, and appetite, to see if you have the diagnostic symptoms of major depressive disorder. Your health care provider may ask about your medical history and use of alcohol or drugs, including prescription medicines. Your health care provider also may do a physical exam and blood work. This is because certain medical conditions and the use of certain substances can cause major depressive disorder-like symptoms (secondary depression). Your health care provider also may refer you to a mental health specialist for further evaluation and treatment. TREATMENT It is important to recognize the symptoms of major depressive disorder and seek treatment. The following treatments can be prescribed for this disorder:   Medicine. Antidepressant medicines usually are prescribed. Antidepressant medicines are thought to correct chemical imbalances in the brain that  are commonly associated with major depressive disorder. Other types of medicine may be added if the symptoms do not respond to antidepressant medicines alone  or if psychotic delusions or hallucinations occur.  Talk therapy. Talk therapy can be helpful in treating major depressive disorder by providing support, education, and guidance. Certain types of talk therapy also can help with negative thinking (cognitive behavioral therapy) and with relationship issues that trigger this disorder (interpersonal therapy). A mental health specialist can help determine which treatment is best for you. Most people with major depressive disorder do well with a combination of medicine and talk therapy. Treatments involving electrical stimulation of the brain can be used in situations with extremely severe symptoms or when medicine and talk therapy do not work over time. These treatments include electroconvulsive therapy, transcranial magnetic stimulation, and vagal nerve stimulation.   This information is not intended to replace advice given to you by your health care provider. Make sure you discuss any questions you have with your health care provider.   Document Released: 06/05/2012 Document Revised: 03/01/2014 Document Reviewed: 06/05/2012 Elsevier Interactive Patient Education [employer].

## 2015-01-08 NOTE — ED Notes (Signed)
Pt reports that she wants a shower. Told her she can have a shower after 9AM and that she can shower by herself. She insists that she will need help with her socks and dressing. Assured her that we will help her in whatever capacity she needs it, but that we are here to encourage her independence if at all possible.

## 2015-01-08 NOTE — Progress Notes (Signed)
PT Cancellation Note  Patient Details Name: MINTA CANA MRN: [id number] DOB: 03-13-1945   Cancelled Treatment:    Reason Eval/Treat Not Completed: Other (comment). PT consult received and chart reviewed. Per chart, pt independent with all mobility and taking showers while in ED. Per RN, no safety concerns at this time. Pt to be dc to ALF this date, no skilled PT needs indicated at this time. Will sign off and dc current orders.   Cellie Dardis 01/08/2015, 11:09 AM  Greggory Stallion, PT, DPT [phone]

## 2015-01-08 NOTE — NC FL2 (Signed)
[place] LEVEL OF CARE SCREENING TOOL     IDENTIFICATION  Patient Name: [name] [name] Birthdate: [birth year] Sex: female Admission Date (Current Location): 12/24/2014  [facility] and [place] Number:  [name] )  ([id number]) Facility and Address:  [facility], [address], [place], [place] [address]      Provider Number: [id number]  Attending Physician Name and Address:  No att. providers found  Relative Name and Phone Number:       Current Level of Care: Hospital Recommended Level of Care: [facility] Prior Approval Number:    Date Approved/Denied:   PASRR Number:    Discharge Plan: Domiciliary (Rest home)    Current Diagnoses: Patient Active Problem List   Diagnosis Date Noted  . Schizoaffective disorder, bipolar type ([place])   . Schizoaffective disorder ([place]) 12/23/2014  . Schizoaffective disorder, bipolar type with good prognostic features ([place]) 10/30/2014  . Urinary incontinence 10/30/2014  . GERD (gastroesophageal reflux disease) 10/30/2014  . OSA (obstructive sleep apnea) 10/30/2014  . Osteoarthrosis, unspecified whether generalized or localized, involving lower leg 10/30/2014  . COPD (chronic obstructive pulmonary disease) ([place]) 10/30/2014  . Chronic diastolic CHF (congestive heart failure) ([place]) 05/15/2014  . Obesity   . History of colon polyps 03/09/2013  . Renal mass 06/26/2011  . Lytic bone lesion of hip 06/25/2011  . Hypertension 06/24/2011  . Diabetes mellitus ([place]) 06/24/2011    Orientation ACTIVITIES/SOCIAL BLADDER RESPIRATION    Self, Time, Situation, Place    Incontinent Normal  BEHAVIORAL SYMPTOMS/MOOD NEUROLOGICAL BOWEL NUTRITION STATUS      Continent Diet (regular)  PHYSICIAN VISITS COMMUNICATION OF NEEDS Height & Weight Skin    Verbally 5\' 6"  (167.6 cm) 273 lbs. Normal          AMBULATORY STATUS RESPIRATION    Supervision limited Normal      Personal Care Assistance Level of  Assistance  Bathing, Feeding, Dressing Bathing Assistance: Limited assistance Feeding assistance: Independent Dressing Assistance: Limited assistance      Functional Limitations Info  Sight, Hearing, Speech Sight Info: Adequate Hearing Info: Adequate Speech Info: Adequate       SPECIAL CARE FACTORS FREQUENCY                      Additional Factors Info  Psychotropic, Allergies   Allergies Info:  (Haldol, Metformin, Raspberry)           Current Medications (01/08/2015): Current Facility-Administered Medications  Medication Dose Route Frequency Provider Last Rate Last Dose  . acetaminophen (TYLENOL) tablet 650 mg  650 mg Oral Q4H PRN Daymon Larsen, MD   650 mg at 01/06/15 2018  . albuterol (PROVENTIL) (2.5 MG/3ML) 0.083% nebulizer solution 2.5 mg  2.5 mg Nebulization Q6H PRN Nance Pear, MD   2.5 mg at 12/28/14 2019  . atorvastatin (LIPITOR) tablet 20 mg  20 mg Oral QHS Nance Pear, MD   20 mg at 01/07/15 2251  . cephALEXin (KEFLEX) 500 MG capsule           . cephALEXin (KEFLEX) capsule 500 mg  500 mg Oral Q12H Joanne Gavel, MD   500 mg at 01/08/15 1137  . docusate sodium (COLACE) capsule 100 mg  100 mg Oral Daily Schuyler Amor, MD   100 mg at 01/08/15 1054  . fluPHENAZine (PROLIXIN) tablet 10 mg  10 mg Oral QHS Nance Pear, MD   10 mg at 01/07/15 2251  . furosemide (LASIX) tablet 40 mg  40  mg Oral Q M,W,F Nance Pear, MD   40 mg at 01/08/15 1057  . glipiZIDE (GLUCOTROL) tablet 10 mg  10 mg Oral BID Nance Pear, MD   10 mg at 01/08/15 1137  . linagliptin (TRADJENTA) tablet 5 mg  5 mg Oral Daily Nance Pear, MD   5 mg at 01/08/15 1055  . lisinopril (PRINIVIL,ZESTRIL) tablet 20 mg  20 mg Oral Daily Gonzella Lex, MD   20 mg at 01/08/15 0051  . mometasone-formoterol (DULERA) 100-5 MCG/ACT inhaler 2 puff  2 puff Inhalation BID Nance Pear, MD   2 puff at 01/08/15 0825  . verapamil (CALAN) tablet 40 mg  40 mg Oral 4 times per day Nance Pear, MD   40 mg at 01/08/15 [id number]   Current Outpatient Prescriptions  Medication Sig Dispense Refill  . albuterol (PROVENTIL HFA;VENTOLIN HFA) 108 (90 BASE) MCG/ACT inhaler Inhale 2 puffs into the lungs 4 (four) times daily as needed for wheezing or shortness of breath.    Marland Kitchen aspirin EC 81 MG tablet Take 81 mg by mouth daily.    Marland Kitchen atorvastatin (LIPITOR) 20 MG tablet Take 20 mg by mouth at bedtime.    . fluPHENAZine (PROLIXIN) 10 MG tablet Take 10 mg by mouth at bedtime.    . fluPHENAZine decanoate (PROLIXIN) 25 MG/ML injection Inject 50 mg into the muscle every 21 ( twenty-one) days.    . Fluticasone-Salmeterol (ADVAIR) 250-50 MCG/DOSE AEPB Inhale 1 puff into the lungs 2 (two) times daily.    . furosemide (LASIX) 40 MG tablet Take 40 mg by mouth every Monday, Wednesday, and Friday.    Marland Kitchen glipiZIDE (GLUCOTROL) 10 MG tablet Take 10 mg by mouth 2 (two) times daily.    . insulin glargine (LANTUS) 100 UNIT/ML injection Inject 0.15 mLs (15 Units total) into the skin at bedtime. 10 mL 2  . lisinopril (PRINIVIL,ZESTRIL) 5 MG tablet Take 5 mg by mouth daily.    . ranitidine (ZANTAC) 150 MG tablet Take 150 mg by mouth 2 (two) times daily.    . sitaGLIPtin (JANUVIA) 100 MG tablet Take 100 mg by mouth daily.    . verapamil (CALAN) 40 MG tablet Take 40 mg by mouth every 6 (six) hours.    . benztropine (COGENTIN) 0.5 MG tablet Take 1 tablet (0.5 mg total) by mouth 2 (two) times daily. (Patient not taking: Reported on 12/24/2014) 60 tablet 0  . cephALEXin (KEFLEX) 250 MG capsule Take 1 capsule (250 mg total) by mouth 4 (four) times daily. 40 capsule 0  . dicyclomine (BENTYL) 20 MG tablet Take 1 tablet (20 mg total) by mouth 3 (three) times daily as needed for spasms. (Patient not taking: Reported on 12/24/2014) 30 tablet 0  . docusate sodium (COLACE) 100 MG capsule Take 2 capsules (200 mg total) by mouth 2 (two) times daily. (Patient not taking: Reported on 12/24/2014) 120 capsule 0  . fluPHENAZine (PROLIXIN) 5  MG tablet Take 3 tablets (15 mg total) by mouth at bedtime. (Patient not taking: Reported on 12/24/2014) 90 tablet 0  . fluPHENAZine decanoate (PROLIXIN) 25 MG/ML injection Inject 1 mL (25 mg total) into the muscle every 14 (fourteen) days. (Patient not taking: Reported on 12/24/2014) 5 mL 0  . lisinopril (PRINIVIL,ZESTRIL) 20 MG tablet Take 1 tablet (20 mg total) by mouth every morning. (Patient not taking: Reported on 12/24/2014) 30 tablet 0  . metoCLOPramide (REGLAN) 10 MG tablet Take 1 tablet (10 mg total) by mouth every 6 (six) hours as needed for  nausea or vomiting. (Patient not taking: Reported on 12/24/2014) 12 tablet 1  . tiotropium (SPIRIVA) 18 MCG inhalation capsule Place 1 capsule (18 mcg total) into inhaler and inhale daily. (Patient not taking: Reported on 12/23/2014) 30 capsule 12  . traMADol (ULTRAM) 50 MG tablet Take 1 tablet (50 mg total) by mouth every 6 (six) hours as needed. (Patient not taking: Reported on 12/23/2014) 20 tablet 0   Do not use this list as official medication orders. Please verify with discharge summary.  Discharge Medications:   Medication List    TAKE these medications        cephALEXin 250 MG capsule  Commonly known as:  KEFLEX  Take 1 capsule (250 mg total) by mouth 4 (four) times daily.      ASK your doctor about these medications        albuterol 108 (90 BASE) MCG/ACT inhaler  Commonly known as:  PROVENTIL HFA;VENTOLIN HFA  Inhale 2 puffs into the lungs 4 (four) times daily as needed for wheezing or shortness of breath.     aspirin EC 81 MG tablet  Take 81 mg by mouth daily.     atorvastatin 20 MG tablet  Commonly known as:  LIPITOR  Take 20 mg by mouth at bedtime.     benztropine 0.5 MG tablet  Commonly known as:  COGENTIN  Take 1 tablet (0.5 mg total) by mouth 2 (two) times daily.     dicyclomine 20 MG tablet  Commonly known as:  BENTYL  Take 1 tablet (20 mg total) by mouth 3 (three) times daily as needed for spasms.     docusate  sodium 100 MG capsule  Commonly known as:  COLACE  Take 2 capsules (200 mg total) by mouth 2 (two) times daily.     fluPHENAZine 10 MG tablet  Commonly known as:  PROLIXIN  Take 10 mg by mouth at bedtime.     fluPHENAZine 5 MG tablet  Commonly known as:  PROLIXIN  Take 3 tablets (15 mg total) by mouth at bedtime.     fluPHENAZine decanoate 25 MG/ML injection  Commonly known as:  PROLIXIN  Inject 50 mg into the muscle every 21 ( twenty-one) days.     fluPHENAZine decanoate 25 MG/ML injection  Commonly known as:  PROLIXIN  Inject 1 mL (25 mg total) into the muscle every 14 (fourteen) days.     Fluticasone-Salmeterol 250-50 MCG/DOSE Aepb  Commonly known as:  ADVAIR  Inhale 1 puff into the lungs 2 (two) times daily.     furosemide 40 MG tablet  Commonly known as:  LASIX  Take 40 mg by mouth every Monday, Wednesday, and Friday.     glipiZIDE 10 MG tablet  Commonly known as:  GLUCOTROL  Take 10 mg by mouth 2 (two) times daily.     insulin glargine 100 UNIT/ML injection  Commonly known as:  LANTUS  Inject 0.15 mLs (15 Units total) into the skin at bedtime.     lisinopril 5 MG tablet  Commonly known as:  PRINIVIL,ZESTRIL  Take 5 mg by mouth daily.     lisinopril 20 MG tablet  Commonly known as:  PRINIVIL,ZESTRIL  Take 1 tablet (20 mg total) by mouth every morning.     metoCLOPramide 10 MG tablet  Commonly known as:  REGLAN  Take 1 tablet (10 mg total) by mouth every 6 (six) hours as needed for nausea or vomiting.     ranitidine 150 MG tablet  Commonly known as:  ZANTAC  Take 150 mg by mouth 2 (two) times daily.     sitaGLIPtin 100 MG tablet  Commonly known as:  JANUVIA  Take 100 mg by mouth daily.     tiotropium 18 MCG inhalation capsule  Commonly known as:  SPIRIVA  Place 1 capsule (18 mcg total) into inhaler and inhale daily.     traMADol 50 MG tablet  Commonly known as:  ULTRAM  Take 1 tablet (50 mg total) by mouth every 6 (six) hours as needed.      verapamil 40 MG tablet  Commonly known as:  CALAN  Take 40 mg by mouth every 6 (six) hours.        Relevant Imaging Results:  Relevant Lab Results:  Recent Labs    Additional Information  [phone])  Alonna Buckler, LCSW

## 2015-01-08 NOTE — ED Notes (Addendum)
Pt discharged to [facility] via her son. Pt alert & oriented with NAD noted.

## 2015-01-08 NOTE — ED Provider Notes (Signed)
Patient's in no acute distress, is medically and appears to be psychiatrically cleared for discharge. She'll be discharged on Keflex.  Earleen Newport, MD 01/08/15 [phone]

## 2015-01-08 NOTE — ED Notes (Signed)
Patient requesting to speak with chaplain. Chaplain paged to patient room.

## 2015-01-08 NOTE — Progress Notes (Signed)
CSW received PASRR and Pt is able to go to ALF [facility].  CSW updated ALF and they are ready for Pt. CSW updated Pt's son and he can come get her before noon or it will have to be after 4pm.  CSW contacted RN who is updating MD. Pt will need script for antibiotic started in the ED.   Toma Copier, [place]

## 2015-01-08 NOTE — ED Notes (Signed)
Spoke with Dr. Owens Shark about lisinopril dose given, Dr. Owens Shark states to hold morning dose of lisinopril but still give verapamil dose.
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
## 2015-01-08 NOTE — ED Notes (Signed)
Pt given phone to call family member.

## 2015-01-08 NOTE — ED Notes (Signed)
When greeting the pt, she asked this nurse if she could find out "why the aides are so mean to me." Assured pt that we are here to help her and will treat her respectfully.

## 2015-01-08 NOTE — ED Notes (Signed)
Called the [name] to give report; left message for amber

## 2015-01-08 NOTE — ED Provider Notes (Signed)
Patient is in no acute distress, remains medically stable for psychiatric disposition.  Earleen Newport, MD 01/08/15 [phone]

## 2015-01-08 NOTE — ED Notes (Signed)
Pt assisted in shower by Vivien Rota, NT. Pt alert, oriented, cooperative at this time. Will continue to monitor.

## 2015-02-07 ENCOUNTER — Emergency Department
Admission: EM | Admit: 2015-02-07 | Discharge: 2015-02-07 | Disposition: A | Discharge status: Home / Self Care | Payer: Medicare HMO | Attending: Emergency Medicine | Admitting: Emergency Medicine

## 2015-02-07 DIAGNOSIS — Z79899 Other long term (current) drug therapy: Secondary | ICD-10-CM | POA: Diagnosis not present

## 2015-02-07 DIAGNOSIS — M79605 Pain in left leg: Secondary | ICD-10-CM | POA: Diagnosis present

## 2015-02-07 DIAGNOSIS — M79604 Pain in right leg: Secondary | ICD-10-CM | POA: Diagnosis not present

## 2015-02-07 DIAGNOSIS — Z794 Long term (current) use of insulin: Secondary | ICD-10-CM | POA: Diagnosis not present

## 2015-02-07 DIAGNOSIS — I10 Essential (primary) hypertension: Secondary | ICD-10-CM | POA: Diagnosis not present

## 2015-02-07 DIAGNOSIS — E119 Type 2 diabetes mellitus without complications: Secondary | ICD-10-CM | POA: Diagnosis not present

## 2015-02-07 DIAGNOSIS — G8929 Other chronic pain: Secondary | ICD-10-CM | POA: Diagnosis not present

## 2015-02-07 DIAGNOSIS — Z7982 Long term (current) use of aspirin: Secondary | ICD-10-CM | POA: Diagnosis not present

## 2015-02-07 DIAGNOSIS — Z87891 Personal history of nicotine dependence: Secondary | ICD-10-CM | POA: Diagnosis not present

## 2015-02-07 DIAGNOSIS — Z7951 Long term (current) use of inhaled steroids: Secondary | ICD-10-CM | POA: Diagnosis not present

## 2015-02-07 DIAGNOSIS — F209 Schizophrenia, unspecified: Secondary | ICD-10-CM | POA: Diagnosis not present

## 2015-02-07 DIAGNOSIS — M79602 Pain in left arm: Secondary | ICD-10-CM

## 2015-02-07 NOTE — ED Provider Notes (Signed)
[facility] Emergency Department Provider Note  ____________________________________________  Time seen: Approximately 2:36 AM  I have reviewed the triage vital signs and the nursing notes.   HISTORY  Chief Complaint Fall    HPI [name] [name] is a 69 y.o. female with a number of chronic medical problems including schizophrenia and who comes to the emergency department frequently for a variety of complaints presents tonight by EMS with pain in her left leg.  Reportedly she was at her nursing home and her left knee gave out on her and she was eased slowly to the floor.  She states that her left leg always feels like this but she wanted to come get checked out so that she can get a knee replacement surgery tonight.  Shortly after arrival to the emergency department she fell asleep and was resting comfortably until I could see her.  She requested some food and had eaten some peanut butter and crackers by the time I saw her.  She is in no acute distress.  She has some mild flight of ideas which is typical for her.  She denies fever/chills, chest pain, shortness of breath, abdominal pain, nausea/vomiting.  She does endorse some pain in her leg although initially she told me it was her right leg.  When I pointed out that it was her left leg gave out earlier, she corrected herself and said that her left knee is hurting but it always hurts.  She has had a prior knee replacement on the right side but no surgery on her left.  She describes the pain as mild to moderate, intermittent, worse with ambulation.  She does walk with a walker at baseline.   Past Medical History  Diagnosis Date  . DM2 (diabetes mellitus, type 2) ([place])   . Hypertension   . Hypercholesteremia   . Schizophrenia ([place])   . Osteoarthritis   . Bursitis   . OSA (obstructive sleep apnea)     does not use machine  . Left ventricular outflow tract obstruction     a. echo 03/2014: EF 60-65%, hypernamic LV  systolic fxn, mod LVH w/ LVOT gradient estimated at 68 mm Hg w/ valsalva, very small LV internal cavity size, mildly increased LV posterior wall thickness, mild Ao valve scl w/o stenosis, diastolic dysfunction, normal RVSP  . LVH (left ventricular hypertrophy)     a. echo suggests long standing uncontrolled htn. she will not do well when dehydrated, LV cavity obliteration  . Obesity   . CHF (congestive heart failure) ([place])   . Anxiety   . COPD (chronic obstructive pulmonary disease) ([place])   . Bronchitis   . CAD (coronary artery disease)   . Tremors of nervous system   . GERD (gastroesophageal reflux disease)   . Environmental allergies   . Arthritis   . Depression   . Chronic kidney disease     shadow on x-ray  . Muscle weakness   . Chronic cough   . Lower extremity edema   . Wheezing   . On supplemental oxygen therapy     AS NEEDED    Patient Active Problem List   Diagnosis Date Noted  . Schizoaffective disorder, bipolar type ([place])   . Schizoaffective disorder ([place]) 12/23/2014  . Schizoaffective disorder, bipolar type with good prognostic features ([place]) 10/30/2014  . Urinary incontinence 10/30/2014  . GERD (gastroesophageal reflux disease) 10/30/2014  . OSA (obstructive sleep apnea) 10/30/2014  . Osteoarthrosis, unspecified whether generalized or localized, involving lower leg  10/30/2014  . COPD (chronic obstructive pulmonary disease) ([place]) 10/30/2014  . Chronic diastolic CHF (congestive heart failure) ([place]) 05/15/2014  . Obesity   . History of colon polyps 03/09/2013  . Renal mass 06/26/2011  . Lytic bone lesion of hip 06/25/2011  . Hypertension 06/24/2011  . Diabetes mellitus ([place]) 06/24/2011    Past Surgical History  Procedure Laterality Date  . Tonsillectomy    . Knee reconstruction, medial patellar femoral ligament    . Cholecystectomy    . Colonoscopy  04/2011    [facility] per patient incomplete  . Tonsillectomy    . Cataract extraction w/phaco Left 09/10/2014     Procedure: CATARACT EXTRACTION PHACO AND INTRAOCULAR LENS PLACEMENT (IOC);  Surgeon: Birder Robson, MD;  Location: [facility] ORS;  Service: Ophthalmology;  Laterality: Left;  [place]: 00:44 AP%: 22.8 CDE: 10.24 Fluid lot [id number]  . Joint replacement      TKR  . Eye surgery    . Cataract extraction w/phaco Right 10/15/2014    Procedure: CATARACT EXTRACTION PHACO AND INTRAOCULAR LENS PLACEMENT (IOC);  Surgeon: Birder Robson, MD;  Location: [facility] ORS;  Service: Ophthalmology;  Laterality: Right;  [place]: 00:52 AP:40.1 CDE:11.83 LOT [id number]    Current Outpatient Rx  Name  Route  Sig  Dispense  Refill  . albuterol (PROVENTIL HFA;VENTOLIN HFA) 108 (90 BASE) MCG/ACT inhaler   Inhalation   Inhale 2 puffs into the lungs 4 (four) times daily as needed for wheezing or shortness of breath.         Marland Kitchen aspirin EC 81 MG tablet   Oral   Take 81 mg by mouth daily.         Marland Kitchen atorvastatin (LIPITOR) 20 MG tablet   Oral   Take 20 mg by mouth at bedtime.         . benztropine (COGENTIN) 0.5 MG tablet   Oral   Take 1 tablet (0.5 mg total) by mouth 2 (two) times daily. Patient not taking: Reported on 12/24/2014   60 tablet   0   . dicyclomine (BENTYL) 20 MG tablet   Oral   Take 1 tablet (20 mg total) by mouth 3 (three) times daily as needed for spasms. Patient not taking: Reported on 12/24/2014   30 tablet   0   . docusate sodium (COLACE) 100 MG capsule   Oral   Take 2 capsules (200 mg total) by mouth 2 (two) times daily. Patient not taking: Reported on 12/24/2014   120 capsule   0   . fluPHENAZine (PROLIXIN) 10 MG tablet   Oral   Take 10 mg by mouth at bedtime.         . fluPHENAZine (PROLIXIN) 5 MG tablet   Oral   Take 3 tablets (15 mg total) by mouth at bedtime. Patient not taking: Reported on 12/24/2014   90 tablet   0   . fluPHENAZine decanoate (PROLIXIN) 25 MG/ML injection   Intramuscular   Inject 1 mL (25 mg total) into the muscle every 14 (fourteen) days. Patient not  taking: Reported on 12/24/2014   5 mL   0     Due on 9/22   . fluPHENAZine decanoate (PROLIXIN) 25 MG/ML injection   Intramuscular   Inject 50 mg into the muscle every 21 ( twenty-one) days.         . Fluticasone-Salmeterol (ADVAIR) 250-50 MCG/DOSE AEPB   Inhalation   Inhale 1 puff into the lungs 2 (two) times daily.         Marland Kitchen  furosemide (LASIX) 40 MG tablet   Oral   Take 40 mg by mouth every Monday, Wednesday, and Friday.         Marland Kitchen glipiZIDE (GLUCOTROL) 10 MG tablet   Oral   Take 10 mg by mouth 2 (two) times daily.         . insulin glargine (LANTUS) 100 UNIT/ML injection   Subcutaneous   Inject 0.15 mLs (15 Units total) into the skin at bedtime.   10 mL   2   . lisinopril (PRINIVIL,ZESTRIL) 20 MG tablet   Oral   Take 1 tablet (20 mg total) by mouth every morning. Patient not taking: Reported on 12/24/2014   30 tablet   0   . lisinopril (PRINIVIL,ZESTRIL) 5 MG tablet   Oral   Take 5 mg by mouth daily.         . metoCLOPramide (REGLAN) 10 MG tablet   Oral   Take 1 tablet (10 mg total) by mouth every 6 (six) hours as needed for nausea or vomiting. Patient not taking: Reported on 12/24/2014   12 tablet   1   . ranitidine (ZANTAC) 150 MG tablet   Oral   Take 150 mg by mouth 2 (two) times daily.         . sitaGLIPtin (JANUVIA) 100 MG tablet   Oral   Take 100 mg by mouth daily.         Marland Kitchen tiotropium (SPIRIVA) 18 MCG inhalation capsule   Inhalation   Place 1 capsule (18 mcg total) into inhaler and inhale daily. Patient not taking: Reported on 12/23/2014   30 capsule   12   . traMADol (ULTRAM) 50 MG tablet   Oral   Take 1 tablet (50 mg total) by mouth every 6 (six) hours as needed. Patient not taking: Reported on 12/23/2014   20 tablet   0   . verapamil (CALAN) 40 MG tablet   Oral   Take 40 mg by mouth every 6 (six) hours.           Allergies Haldol; Metformin; and Raspberry  Family History  Problem Relation Age of Onset  . Colon  cancer Neg Hx   . Liver disease Neg Hx   . Heart attack Mother     Social History Social History  Substance Use Topics  . Smoking status: Former Research scientist (life sciences)  . Smokeless tobacco: Never Used  . Alcohol Use: No     Comment: occ.    Review of Systems Constitutional: No fever/chills Eyes: No visual changes. ENT: No sore throat. Cardiovascular: Denies chest pain. Respiratory: Denies shortness of breath. Gastrointestinal: No abdominal pain.  No nausea, no vomiting.  No diarrhea.  No constipation. Genitourinary: Negative for dysuria. Musculoskeletal: pain in both of her lower extremities which is chronic Skin: Negative for rash. Neurological: Negative for headaches, focal weakness or numbness.  10-point ROS otherwise negative.  ____________________________________________   PHYSICAL EXAM:  VITAL SIGNS: ED Triage Vitals  Enc Vitals Group     BP 02/07/15 0030 164/93 mmHg     Pulse Rate 02/07/15 0030 87     Resp 02/07/15 0030 20     Temp 02/07/15 0030 97.3 F (36.3 C)     Temp Source 02/07/15 0030 Oral     SpO2 02/07/15 0030 97 %     Weight 02/07/15 0030 273 lb (123.832 kg)     Height 02/07/15 0030 5\' 6"  (1.676 m)     Head Cir --  Peak Flow --      Pain Score 02/07/15 0030 9     Pain Loc --      Pain Edu? --      Excl. in [place]? --     Constitutional: Alert, disheveled, in no acute distress, the patient is at her baseline Eyes: Conjunctivae are normal. PERRL. EOMI. Head: Atraumatic. Nose: No congestion/rhinnorhea. Mouth/Throat: Mucous membranes are moist.  Oropharynx non-erythematous. Neck: No stridor.   Cardiovascular: Normal rate, regular rhythm. Grossly normal heart sounds.  Good peripheral circulation. Respiratory: Normal respiratory effort.  No retractions. Lungs CTAB. Gastrointestinal: morbid obesity,Soft and nontender. No distention. No abdominal bruits. No CVA tenderness. Musculoskeletal: No lower extremity tenderness nor edema.  No joint effusions.old surgical  scar over her right knee.  No evidence of any traumatic injury including no contusion or ecchymosis.  Patient is able to ambulate with a walker after verbal encouragement. Neurologic:  Normal speech and language. No gross focal neurologic deficits are appreciated.  Skin:  Skin is warm, dry and intact. No rash noted.   ____________________________________________   LABS (all labs ordered are listed, but only abnormal results are displayed)  Labs Reviewed - No data to display ____________________________________________  EKG  Not indicated ____________________________________________  RADIOLOGY   No results found.  ____________________________________________   PROCEDURES  Procedure(s) performed: None  Critical Care performed: No ____________________________________________   INITIAL IMPRESSION / ASSESSMENT AND PLAN / ED COURSE  Pertinent labs & imaging results that were available during my care of the patient were reviewed by me and considered in my medical decision making (see chart for details).  The patient seems to be at her baseline.  I explained to her that we cannot perform a knee replacement surgery in the emergency department.  I suggested that she follow-up with her regular doctor and I also gave her the name of an orthopedic specialist who is on-call currently, Dr. Roland Rack.  She understands.  There is no indication for any imaging as the patient is at her baseline, has no pain or tenderness with passive movement of her legs, and is able to ambulate with her walker as documented by her nurse.  Patient is in no acute distress and understands that she needs to follow up as an outpatient.  ____________________________________________  FINAL CLINICAL IMPRESSION(S) / ED DIAGNOSES  Final diagnoses:  Chronic leg pain, left      NEW MEDICATIONS STARTED DURING THIS VISIT:  New Prescriptions   No medications on file     Hinda Kehr, MD 02/07/15 0320

## 2015-02-07 NOTE — ED Notes (Addendum)
Pt to room 2 via EMS from the [place]; EMS reports pt was walking with the CNA, knees began buckling and staff assisted her down to the ground with no injuries noted; st pt c/o pain left leg since but this is "normal" and told EMS that she wanted to go to the doctor to get a knee replacement tonight

## 2015-02-07 NOTE — ED Notes (Signed)
Pt assisted to room commode with aid of walker and x 1 assist; voided, qs and ret to bed; pt able to walk few steps with walker without difficulty despite stating that she is unable to walk; MD notified

## 2015-02-07 NOTE — ED Notes (Signed)
Called and spoke with Ayesha Rumpf at [facility] regarding pt's d/c and f/u; st to send pt back by EMS; MD aware

## 2015-02-07 NOTE — Discharge Instructions (Signed)
There is no evidence of acute injury tonight.  Please follow up with your primary care doctor, or call Dr. Roland Rack or one of his orthopedics colleagues to schedule a clinic appointment to discuss your chronic knee problems.  Please take your regular medications and follow up as soon as possible.  Continue using your walker when you are up and around.

## 2015-02-10 ENCOUNTER — Emergency Department: Payer: Medicare HMO

## 2015-02-10 ENCOUNTER — Emergency Department
Admission: EM | Admit: 2015-02-10 | Discharge: 2015-02-10 | Disposition: A | Discharge status: Home / Self Care | Payer: Medicare HMO | Attending: Emergency Medicine | Admitting: Emergency Medicine

## 2015-02-10 DIAGNOSIS — M25562 Pain in left knee: Secondary | ICD-10-CM | POA: Insufficient documentation

## 2015-02-10 DIAGNOSIS — Z794 Long term (current) use of insulin: Secondary | ICD-10-CM | POA: Diagnosis not present

## 2015-02-10 DIAGNOSIS — Z87891 Personal history of nicotine dependence: Secondary | ICD-10-CM | POA: Insufficient documentation

## 2015-02-10 DIAGNOSIS — I129 Hypertensive chronic kidney disease with stage 1 through stage 4 chronic kidney disease, or unspecified chronic kidney disease: Secondary | ICD-10-CM | POA: Insufficient documentation

## 2015-02-10 DIAGNOSIS — N189 Chronic kidney disease, unspecified: Secondary | ICD-10-CM | POA: Diagnosis not present

## 2015-02-10 DIAGNOSIS — Z7982 Long term (current) use of aspirin: Secondary | ICD-10-CM | POA: Insufficient documentation

## 2015-02-10 DIAGNOSIS — G8929 Other chronic pain: Secondary | ICD-10-CM | POA: Diagnosis not present

## 2015-02-10 DIAGNOSIS — E119 Type 2 diabetes mellitus without complications: Secondary | ICD-10-CM | POA: Insufficient documentation

## 2015-02-10 DIAGNOSIS — Z79899 Other long term (current) drug therapy: Secondary | ICD-10-CM | POA: Insufficient documentation

## 2015-02-10 DIAGNOSIS — M25569 Pain in unspecified knee: Secondary | ICD-10-CM

## 2015-02-10 DIAGNOSIS — Z7951 Long term (current) use of inhaled steroids: Secondary | ICD-10-CM | POA: Diagnosis not present

## 2015-02-10 MED ORDER — OXYCODONE-ACETAMINOPHEN 5-325 MG PO TABS
1.0000 | ORAL_TABLET | Freq: Once | ORAL | Status: AC
  Administered 2015-02-10: 1 via ORAL
  Filled 2015-02-10: qty 1

## 2015-02-10 NOTE — ED Notes (Signed)
Pt fell last week. C/o neck pain, arm pain, bilateral neck pain. Pt in NAD. Watching television at this time.    Pt alert and oriented X4, active, cooperative, pt in NAD. RR even and unlabored, color WNL.

## 2015-02-10 NOTE — ED Notes (Signed)
MD at bedside. 

## 2015-02-10 NOTE — ED Notes (Signed)
[facility] team staff member states that they will come pick patient up. Approx 30 minutes to arrival.

## 2015-02-10 NOTE — ED Notes (Signed)
Pt sleeping. 

## 2015-02-10 NOTE — ED Provider Notes (Addendum)
[facility] Emergency Department Provider Note  ____________________________________________   I have reviewed the triage vital signs and the nursing notes.   HISTORY  Chief Complaint Fall    HPI [name] [name] is a 69 y.o. female resents today complaining of knee pain which she states she has had for many years. She is scheduled for knee replacement she states. She denies any recent fall in [place] last visit though she was here several days ago for similar. The patient is eating and drinking in the room in no acute distress. She states it hurts to walk.   Past Medical History  Diagnosis Date  . DM2 (diabetes mellitus, type 2) ([place])   . Hypertension   . Hypercholesteremia   . Schizophrenia ([place])   . Osteoarthritis   . Bursitis   . OSA (obstructive sleep apnea)     does not use machine  . Left ventricular outflow tract obstruction     a. echo 03/2014: EF 60-65%, hypernamic LV systolic fxn, mod LVH w/ LVOT gradient estimated at 68 mm Hg w/ valsalva, very small LV internal cavity size, mildly increased LV posterior wall thickness, mild Ao valve scl w/o stenosis, diastolic dysfunction, normal RVSP  . LVH (left ventricular hypertrophy)     a. echo suggests long standing uncontrolled htn. she will not do well when dehydrated, LV cavity obliteration  . Obesity   . CHF (congestive heart failure) ([place])   . Anxiety   . COPD (chronic obstructive pulmonary disease) ([place])   . Bronchitis   . CAD (coronary artery disease)   . Tremors of nervous system   . GERD (gastroesophageal reflux disease)   . Environmental allergies   . Arthritis   . Depression   . Chronic kidney disease     shadow on x-ray  . Muscle weakness   . Chronic cough   . Lower extremity edema   . Wheezing   . On supplemental oxygen therapy     AS NEEDED    Patient Active Problem List   Diagnosis Date Noted  . Schizoaffective disorder, bipolar type ([place])   . Schizoaffective disorder ([place])  12/23/2014  . Schizoaffective disorder, bipolar type with good prognostic features ([place]) 10/30/2014  . Urinary incontinence 10/30/2014  . GERD (gastroesophageal reflux disease) 10/30/2014  . OSA (obstructive sleep apnea) 10/30/2014  . Osteoarthrosis, unspecified whether generalized or localized, involving lower leg 10/30/2014  . COPD (chronic obstructive pulmonary disease) ([place]) 10/30/2014  . Chronic diastolic CHF (congestive heart failure) ([place]) 05/15/2014  . Obesity   . History of colon polyps 03/09/2013  . Renal mass 06/26/2011  . Lytic bone lesion of hip 06/25/2011  . Hypertension 06/24/2011  . Diabetes mellitus ([place]) 06/24/2011    Past Surgical History  Procedure Laterality Date  . Tonsillectomy    . Knee reconstruction, medial patellar femoral ligament    . Cholecystectomy    . Colonoscopy  04/2011    [facility] per patient incomplete  . Tonsillectomy    . Cataract extraction w/phaco Left 09/10/2014    Procedure: CATARACT EXTRACTION PHACO AND INTRAOCULAR LENS PLACEMENT (IOC);  Surgeon: Birder Robson, MD;  Location: [facility] ORS;  Service: Ophthalmology;  Laterality: Left;  [place]: 00:44 AP%: 22.8 CDE: 10.24 Fluid lot [id number]  . Joint replacement      TKR  . Eye surgery    . Cataract extraction w/phaco Right 10/15/2014    Procedure: CATARACT EXTRACTION PHACO AND INTRAOCULAR LENS PLACEMENT (IOC);  Surgeon: Birder Robson, MD;  Location: [facility] ORS;  Service: Ophthalmology;  Laterality: Right;  [place]: 00:52 AP:40.1 CDE:11.83 LOT [id number]    Current Outpatient Rx  Name  Route  Sig  Dispense  Refill  . albuterol (PROVENTIL HFA;VENTOLIN HFA) 108 (90 BASE) MCG/ACT inhaler   Inhalation   Inhale 2 puffs into the lungs 4 (four) times daily as needed for wheezing or shortness of breath.         Marland Kitchen aspirin EC 81 MG tablet   Oral   Take 81 mg by mouth daily.         Marland Kitchen atorvastatin (LIPITOR) 20 MG tablet   Oral   Take 20 mg by mouth at bedtime.         . benztropine (COGENTIN)  0.5 MG tablet   Oral   Take 1 tablet (0.5 mg total) by mouth 2 (two) times daily.   60 tablet   0   . betamethasone dipropionate (DIPROLENE) 0.05 % cream   Topical   Apply topically 2 (two) times daily.         Marland Kitchen dicyclomine (BENTYL) 20 MG tablet   Oral   Take 20 mg by mouth every 6 (six) hours as needed for spasms.         Marland Kitchen docusate sodium (COLACE) 100 MG capsule   Oral   Take 2 capsules (200 mg total) by mouth 2 (two) times daily.   120 capsule   0   . fluPHENAZine (PROLIXIN) 10 MG tablet   Oral   Take 15 mg by mouth at bedtime.          . fluPHENAZine (PROLIXIN) 5 MG/ML solution   Oral   Take 25 mg by mouth every 14 (fourteen) days.         . Fluticasone-Salmeterol (ADVAIR) 250-50 MCG/DOSE AEPB   Inhalation   Inhale 1 puff into the lungs 2 (two) times daily.         . furosemide (LASIX) 40 MG tablet   Oral   Take 40 mg by mouth every Monday, Wednesday, and Friday.         Marland Kitchen glipiZIDE (GLUCOTROL) 10 MG tablet   Oral   Take 10 mg by mouth 2 (two) times daily.         . insulin glargine (LANTUS) 100 UNIT/ML injection   Subcutaneous   Inject 0.15 mLs (15 Units total) into the skin at bedtime.   10 mL   2   . lisinopril (PRINIVIL,ZESTRIL) 20 MG tablet   Oral   Take 1 tablet (20 mg total) by mouth every morning.   30 tablet   0   . lisinopril (PRINIVIL,ZESTRIL) 5 MG tablet   Oral   Take 5 mg by mouth daily.         Marland Kitchen LORazepam (ATIVAN) 0.5 MG tablet   Oral   Take 0.5 mg by mouth 3 (three) times daily.         . metoCLOPramide (REGLAN) 10 MG tablet   Oral   Take 10 mg by mouth every 6 (six) hours as needed for nausea.         . ranitidine (ZANTAC) 150 MG tablet   Oral   Take 150 mg by mouth 2 (two) times daily.         . sitaGLIPtin (JANUVIA) 100 MG tablet   Oral   Take 100 mg by mouth daily.         Marland Kitchen tiotropium (SPIRIVA) 18 MCG inhalation capsule   Inhalation   Place  1 capsule (18 mcg total) into inhaler and inhale  daily.   30 capsule   12   . traMADol (ULTRAM) 50 MG tablet   Oral   Take by mouth every 6 (six) hours as needed.         . verapamil (CALAN) 40 MG tablet   Oral   Take 40 mg by mouth every 6 (six) hours.         Marland Kitchen dicyclomine (BENTYL) 20 MG tablet   Oral   Take 1 tablet (20 mg total) by mouth 3 (three) times daily as needed for spasms. Patient not taking: Reported on 12/24/2014   30 tablet   0   . fluPHENAZine (PROLIXIN) 5 MG tablet   Oral   Take 3 tablets (15 mg total) by mouth at bedtime. Patient not taking: Reported on 12/24/2014   90 tablet   0   . fluPHENAZine decanoate (PROLIXIN) 25 MG/ML injection   Intramuscular   Inject 1 mL (25 mg total) into the muscle every 14 (fourteen) days. Patient not taking: Reported on 12/24/2014   5 mL   0     Due on 9/22   . metoCLOPramide (REGLAN) 10 MG tablet   Oral   Take 1 tablet (10 mg total) by mouth every 6 (six) hours as needed for nausea or vomiting. Patient not taking: Reported on 12/24/2014   12 tablet   1   . traMADol (ULTRAM) 50 MG tablet   Oral   Take 1 tablet (50 mg total) by mouth every 6 (six) hours as needed. Patient not taking: Reported on 12/23/2014   20 tablet   0     Allergies Haldol; Metformin; and Raspberry  Family History  Problem Relation Age of Onset  . Colon cancer Neg Hx   . Liver disease Neg Hx   . Heart attack Mother     Social History Social History  Substance Use Topics  . Smoking status: Former Research scientist (life sciences)  . Smokeless tobacco: Never Used  . Alcohol Use: No     Comment: occ.    Review of Systems Constitutional: No fever/chills Eyes: No visual changes. ENT: No sore throat. No stiff neck no neck pain Cardiovascular: Denies chest pain. Respiratory: Denies shortness of breath. Gastrointestinal:   no vomiting.  No diarrhea.  No constipation. Genitourinary: Negative for dysuria. Musculoskeletal: Negative lower extremity swelling Skin: Negative for rash. Neurological: Negative  for headaches, focal weakness or numbness. 10-point ROS otherwise negative.  ____________________________________________   PHYSICAL EXAM:  VITAL SIGNS: ED Triage Vitals  Enc Vitals Group     BP 02/10/15 1114 145/56 mmHg     Pulse Rate 02/10/15 1114 77     Resp 02/10/15 1114 16     Temp 02/10/15 1114 98.7 F (37.1 C)     Temp Source 02/10/15 1114 Oral     SpO2 02/10/15 1114 98 %     Weight 02/10/15 1114 273 lb (123.832 kg)     Height 02/10/15 1114 5\' 6"  (1.676 m)     Head Cir --      Peak Flow --      Pain Score --      Pain Loc --      Pain Edu? --      Excl. in [place]? --     Constitutional: Alert and oriented. Well appearing and in no acute distress. Eyes: Conjunctivae are normal. PERRL. EOMI. Head: Atraumatic. Nose: No congestion/rhinnorhea. Mouth/Throat: Mucous membranes are moist.  Oropharynx non-erythematous. Neck:  No stridor.   Nontender with no meningismus Cardiovascular: Normal rate, regular rhythm. Grossly normal heart sounds.  Good peripheral circulation. Respiratory: Normal respiratory effort.  No retractions. Lungs CTAB. Abdominal: Soft and nontender. No distention. No guarding no rebound morbid obesity noted Back:  There is no focal tenderness or step off there is no midline tenderness there are no lesions noted. there is no CVA tenderness Musculoskeletal: No lower extremity tenderness. No joint effusions, no DVT signs strong distal pulses no edema  patient's left knee is not red hot or swollen, it is minimally tender with ranging there is no evidence of infection compartment soft strong distal pulses Neurologic:  Normal speech and language. No gross focal neurologic deficits are appreciated.  Skin:  Skin is warm, dry and intact. No rash noted. Psychiatric: Mood and affect are normal. Speech and behavior are normal.  ____________________________________________   LABS (all labs ordered are listed, but only abnormal results are displayed)  Labs Reviewed - No  data to display ____________________________________________  EKG  I personally interpreted any EKGs ordered by me or triage  ____________________________________________  RADIOLOGY  I reviewed any imaging ordered by me or triage that were performed during my shift ____________________________________________   PROCEDURES  Procedure(s) performed: None  Critical Care performed: None  ____________________________________________   INITIAL IMPRESSION / ASSESSMENT AND PLAN / ED COURSE  Pertinent labs & imaging results that were available during my care of the patient were reviewed by me and considered in my medical decision making (see chart for details).  With chronic knee pain presents with chronic knee pain which she states is too painful for her walk. She states they are only giving her Tylenol which is insufficient for her pain. She does not have any hip pain at this time, there is been no recent trauma. The joint does not appear to be infected. We will give her pain medication and see if she can attain ambulation. ____________________________________________  ----------------------------------------- 5:00 PM on 02/10/2015 -----------------------------------------  Pt able to ambulate with no difficulty here.   FINAL CLINICAL IMPRESSION(S) / ED DIAGNOSES  Final diagnoses:  Knee pain     Schuyler Amor, MD 02/10/15 [place], MD 02/10/15 [phone]

## 2015-02-10 NOTE — ED Notes (Signed)
Pt ED tech, pt able to ambulate with walker and 1 person assistance.

## 2015-02-10 NOTE — Discharge Instructions (Signed)

## 2015-02-10 NOTE — ED Notes (Signed)
[facility] unable to [employer] patient. PT cannot get in touch with son for ride home.

## 2015-02-11 ENCOUNTER — Telehealth: Payer: Self-pay | Admitting: Cardiovascular Disease

## 2015-02-11 NOTE — Telephone Encounter (Signed)
3 attempts to schedule fu from recall list.   lmov to call office.  deleting recall.

## 2015-03-09 ENCOUNTER — Encounter: Payer: Self-pay | Admitting: Emergency Medicine

## 2015-03-09 ENCOUNTER — Emergency Department
Admission: EM | Admit: 2015-03-09 | Discharge: 2015-03-10 | Disposition: A | Discharge status: Home / Self Care | Payer: Medicare HMO | Attending: Emergency Medicine | Admitting: Emergency Medicine

## 2015-03-09 DIAGNOSIS — Z87891 Personal history of nicotine dependence: Secondary | ICD-10-CM | POA: Diagnosis not present

## 2015-03-09 DIAGNOSIS — E119 Type 2 diabetes mellitus without complications: Secondary | ICD-10-CM | POA: Diagnosis not present

## 2015-03-09 DIAGNOSIS — F250 Schizoaffective disorder, bipolar type: Secondary | ICD-10-CM | POA: Diagnosis not present

## 2015-03-09 DIAGNOSIS — Z7982 Long term (current) use of aspirin: Secondary | ICD-10-CM | POA: Diagnosis not present

## 2015-03-09 DIAGNOSIS — Z043 Encounter for examination and observation following other accident: Secondary | ICD-10-CM | POA: Diagnosis not present

## 2015-03-09 DIAGNOSIS — I5032 Chronic diastolic (congestive) heart failure: Secondary | ICD-10-CM | POA: Diagnosis present

## 2015-03-09 DIAGNOSIS — Z794 Long term (current) use of insulin: Secondary | ICD-10-CM | POA: Insufficient documentation

## 2015-03-09 DIAGNOSIS — R4689 Other symptoms and signs involving appearance and behavior: Secondary | ICD-10-CM

## 2015-03-09 DIAGNOSIS — Y9389 Activity, other specified: Secondary | ICD-10-CM | POA: Diagnosis not present

## 2015-03-09 DIAGNOSIS — Z7951 Long term (current) use of inhaled steroids: Secondary | ICD-10-CM | POA: Insufficient documentation

## 2015-03-09 DIAGNOSIS — Y92128 Other place in nursing home as the place of occurrence of the external cause: Secondary | ICD-10-CM | POA: Insufficient documentation

## 2015-03-09 DIAGNOSIS — R451 Restlessness and agitation: Secondary | ICD-10-CM

## 2015-03-09 DIAGNOSIS — Y998 Other external cause status: Secondary | ICD-10-CM | POA: Diagnosis not present

## 2015-03-09 DIAGNOSIS — J449 Chronic obstructive pulmonary disease, unspecified: Secondary | ICD-10-CM | POA: Diagnosis present

## 2015-03-09 DIAGNOSIS — I10 Essential (primary) hypertension: Secondary | ICD-10-CM | POA: Diagnosis present

## 2015-03-09 DIAGNOSIS — Z79899 Other long term (current) drug therapy: Secondary | ICD-10-CM | POA: Insufficient documentation

## 2015-03-09 DIAGNOSIS — Z008 Encounter for other general examination: Secondary | ICD-10-CM | POA: Diagnosis present

## 2015-03-09 DIAGNOSIS — F1310 Sedative, hypnotic or anxiolytic abuse, uncomplicated: Secondary | ICD-10-CM | POA: Insufficient documentation

## 2015-03-09 DIAGNOSIS — N189 Chronic kidney disease, unspecified: Secondary | ICD-10-CM | POA: Insufficient documentation

## 2015-03-09 DIAGNOSIS — I129 Hypertensive chronic kidney disease with stage 1 through stage 4 chronic kidney disease, or unspecified chronic kidney disease: Secondary | ICD-10-CM | POA: Insufficient documentation

## 2015-03-09 DIAGNOSIS — F911 Conduct disorder, childhood-onset type: Secondary | ICD-10-CM | POA: Insufficient documentation

## 2015-03-09 LAB — URINE DRUG SCREEN, QUALITATIVE (ARMC ONLY)
AMPHETAMINES, UR SCREEN: NOT DETECTED
Barbiturates, Ur Screen: NOT DETECTED
Benzodiazepine, Ur Scrn: POSITIVE — AB
CANNABINOID 50 NG, UR ~~LOC~~: NOT DETECTED
Cocaine Metabolite,Ur ~~LOC~~: NOT DETECTED
MDMA (ECSTASY) UR SCREEN: NOT DETECTED
METHADONE SCREEN, URINE: NOT DETECTED
Opiate, Ur Screen: NOT DETECTED
Phencyclidine (PCP) Ur S: NOT DETECTED
TRICYCLIC, UR SCREEN: NOT DETECTED

## 2015-03-09 LAB — URINALYSIS COMPLETE WITH MICROSCOPIC (ARMC ONLY)
Bilirubin Urine: NEGATIVE
GLUCOSE, UA: NEGATIVE mg/dL
Hgb urine dipstick: NEGATIVE
Ketones, ur: NEGATIVE mg/dL
Nitrite: POSITIVE — AB
PROTEIN: NEGATIVE mg/dL
Specific Gravity, Urine: 1.013 (ref 1.005–1.030)
pH: 6 (ref 5.0–8.0)

## 2015-03-09 LAB — COMPREHENSIVE METABOLIC PANEL
ALK PHOS: 65 U/L (ref 38–126)
ALT: 18 U/L (ref 14–54)
AST: 20 U/L (ref 15–41)
Albumin: 3.5 g/dL (ref 3.5–5.0)
Anion gap: 5 (ref 5–15)
BUN: 18 mg/dL (ref 6–20)
CALCIUM: 9 mg/dL (ref 8.9–10.3)
CHLORIDE: 102 mmol/L (ref 101–111)
CO2: 31 mmol/L (ref 22–32)
CREATININE: 0.86 mg/dL (ref 0.44–1.00)
GFR calc Af Amer: >60 mL/min (ref >60)
GFR calc non Af Amer: >60 mL/min (ref >60)
Glucose, Bld: 79 mg/dL (ref 65–99)
Potassium: 3.8 mmol/L (ref 3.5–5.1)
SODIUM: 138 mmol/L (ref 135–145)
Total Bilirubin: 0.3 mg/dL (ref 0.3–1.2)
Total Protein: 7.6 g/dL (ref 6.5–8.1)

## 2015-03-09 LAB — CBC
HCT: 40.2 % (ref 35.0–47.0)
HEMOGLOBIN: 12.9 g/dL (ref 12.0–16.0)
MCH: 27.5 pg (ref 26.0–34.0)
MCHC: 32.1 g/dL (ref 32.0–36.0)
MCV: 85.7 fL (ref 80.0–100.0)
PLATELETS: 235 10*3/uL (ref 150–440)
RBC: 4.69 MIL/uL (ref 3.80–5.20)
RDW: 14.1 % (ref 11.5–14.5)
WBC: 11 10*3/uL (ref 3.6–11.0)

## 2015-03-09 LAB — GLUCOSE, CAPILLARY: GLUCOSE-CAPILLARY: 134 mg/dL — AB (ref 65–99)
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
## 2015-03-09 NOTE — BH Specialist Note (Signed)
Chelseamarie refused to speak with TTS.  TTS contacted [facility] [place] staff [phone] and her Guardian (son) [name] [phone] for additional information.

## 2015-03-10 DIAGNOSIS — F250 Schizoaffective disorder, bipolar type: Secondary | ICD-10-CM | POA: Diagnosis not present

## 2015-03-10 DIAGNOSIS — R451 Restlessness and agitation: Secondary | ICD-10-CM | POA: Diagnosis not present

## 2015-03-10 LAB — GLUCOSE, CAPILLARY
GLUCOSE-CAPILLARY: 223 mg/dL — AB (ref 65–99)
Glucose-Capillary: 116 mg/dL — ABNORMAL HIGH (ref 65–99)

## 2015-03-10 MED ORDER — TIOTROPIUM BROMIDE MONOHYDRATE 18 MCG IN CAPS
18.0000 ug | ORAL_CAPSULE | Freq: Every day | RESPIRATORY_TRACT | Status: DC
  Administered 2015-03-10: 18 ug via RESPIRATORY_TRACT
  Filled 2015-03-10: qty 5

## 2015-03-10 MED ORDER — VERAPAMIL HCL 40 MG PO TABS
ORAL_TABLET | ORAL | Status: AC
  Administered 2015-03-10: 40 mg via ORAL
  Filled 2015-03-10: qty 1

## 2015-03-10 MED ORDER — BENZTROPINE MESYLATE 0.5 MG PO TABS
0.5000 mg | ORAL_TABLET | Freq: Two times a day (BID) | ORAL | Status: DC
  Administered 2015-03-10: 0.5 mg via ORAL

## 2015-03-10 MED ORDER — TRAMADOL HCL 50 MG PO TABS
50.0000 mg | ORAL_TABLET | Freq: Four times a day (QID) | ORAL | Status: DC | PRN
  Administered 2015-03-10: 50 mg via ORAL

## 2015-03-10 MED ORDER — BENZTROPINE MESYLATE 0.5 MG PO TABS
ORAL_TABLET | ORAL | Status: AC
  Administered 2015-03-10: 0.5 mg via ORAL
  Filled 2015-03-10: qty 1

## 2015-03-10 MED ORDER — FLUPHENAZINE HCL 5 MG PO TABS
ORAL_TABLET | ORAL | Status: AC
  Administered 2015-03-10: 15 mg via ORAL
  Filled 2015-03-10: qty 3

## 2015-03-10 MED ORDER — LINAGLIPTIN 5 MG PO TABS
5.0000 mg | ORAL_TABLET | Freq: Every day | ORAL | Status: DC
  Administered 2015-03-10: 5 mg via ORAL
  Filled 2015-03-10: qty 1

## 2015-03-10 MED ORDER — GLIPIZIDE 10 MG PO TABS: 10.0000 mg | ORAL_TABLET | Freq: Two times a day (BID) | ORAL | Status: DC

## 2015-03-10 MED ORDER — MOMETASONE FURO-FORMOTEROL FUM 100-5 MCG/ACT IN AERO
2.0000 | INHALATION_SPRAY | Freq: Two times a day (BID) | RESPIRATORY_TRACT | Status: DC
  Administered 2015-03-10: 2 via RESPIRATORY_TRACT
  Filled 2015-03-10: qty 8.8

## 2015-03-10 MED ORDER — LISINOPRIL 20 MG PO TABS
20.0000 mg | ORAL_TABLET | Freq: Every day | ORAL | Status: DC
  Administered 2015-03-10: 20 mg via ORAL
  Filled 2015-03-10: qty 1

## 2015-03-10 MED ORDER — ASPIRIN EC 81 MG PO TBEC
81.0000 mg | DELAYED_RELEASE_TABLET | Freq: Every day | ORAL | Status: DC
  Administered 2015-03-10: 81 mg via ORAL
  Filled 2015-03-10: qty 1

## 2015-03-10 MED ORDER — FLUPHENAZINE HCL 5 MG PO TABS
15.0000 mg | ORAL_TABLET | Freq: Every day | ORAL | Status: DC
  Administered 2015-03-10: 15 mg via ORAL

## 2015-03-10 MED ORDER — ATORVASTATIN CALCIUM 20 MG PO TABS: 20.0000 mg | ORAL_TABLET | Freq: Every day | ORAL | Status: DC

## 2015-03-10 MED ORDER — INSULIN GLARGINE 100 UNIT/ML ~~LOC~~ SOLN
15.0000 [IU] | Freq: Every day | SUBCUTANEOUS | Status: DC
  Administered 2015-03-10: 15 [IU] via SUBCUTANEOUS
  Filled 2015-03-10 (×2): qty 0.15

## 2015-03-10 MED ORDER — VERAPAMIL HCL 40 MG PO TABS
40.0000 mg | ORAL_TABLET | Freq: Three times a day (TID) | ORAL | Status: DC
  Administered 2015-03-10: 40 mg via ORAL

## 2015-03-10 MED ORDER — TRAMADOL HCL 50 MG PO TABS
ORAL_TABLET | ORAL | Status: AC
  Administered 2015-03-10: 50 mg via ORAL
  Filled 2015-03-10: qty 1

## 2015-03-10 MED ORDER — ALBUTEROL SULFATE HFA 108 (90 BASE) MCG/ACT IN AERS: 2.0000 | INHALATION_SPRAY | RESPIRATORY_TRACT | Status: DC | PRN

## 2015-03-10 MED ORDER — ALBUTEROL SULFATE (2.5 MG/3ML) 0.083% IN NEBU: 2.5000 mg | INHALATION_SOLUTION | RESPIRATORY_TRACT | Status: DC | PRN

## 2015-03-10 MED ORDER — HYDROCORTISONE 1 % EX CREA
TOPICAL_CREAM | Freq: Two times a day (BID) | CUTANEOUS | Status: DC
  Administered 2015-03-10: 1 via TOPICAL
  Filled 2015-03-10: qty 28

## 2015-03-10 NOTE — ED Notes (Signed)
Mariana Kaufman at bedside per patient request.

## 2015-03-10 NOTE — ED Notes (Signed)
Patient got up to bathroom to void.  She says she cannot walk and she needs 3 asssists.  Pt got on toilet and then could not get back up.  We got her into wheelchair and took her back to room and placed in bed.  She has areas on upper buttocks with ?scratch marks and dry rashy areas.  Says they are from eczema.   Diaper changed.  She is eating her breakfast now.

## 2015-03-10 NOTE — ED Notes (Signed)
BEHAVIORAL HEALTH ROUNDING Patient sleeping: Yes.   Patient alert and oriented: not applicable SLEEPING Behavior appropriate: Yes.  ; If no, describe: SLEEPING Nutrition and fluids offered: No SLEEPING Toileting and hygiene offered: NoSLEEPING Sitter present: not applicable, Q 15 min safety rounds and observation. Law enforcement present: Yes ODS 

## 2015-03-10 NOTE — ED Notes (Signed)
BEHAVIORAL HEALTH ROUNDING  Patient sleeping: No.  Patient alert and oriented: yes  Behavior appropriate: Yes. ; If no, describe:  Nutrition and fluids offered: Yes  Toileting and hygiene offered: Yes  Sitter present: not applicable, Q 15 min safety rounds and observation. Law enforcement present: Yes ODS  ENVIRONMENTAL ASSESSMENT  Potentially harmful objects out of patient reach: Yes.  Personal belongings secured: Yes.  Patient dressed in hospital provided attire only: Yes.  Plastic bags out of patient reach: Yes.  Patient care equipment (cords, cables, call bells, lines, and drains) shortened, removed, or accounted for: Yes.  Equipment and supplies removed from bottom of stretcher: Yes.  Potentially toxic materials out of patient reach: Yes.  Sharps container removed or out of patient reach: Yes.

## 2015-03-10 NOTE — Progress Notes (Signed)
   03/10/15 1700  Clinical Encounter Type  Visited With Patient  Visit Type Follow-up  Referral From Nurse  Consult/Referral To Chaplain  Spiritual Encounters  Spiritual Needs Sacred text;Prayer  Stress Factors  Patient Stress Factors Exhausted;Family relationships  Met w/patient who was emotional and requested prayer. Provided compassionate presence and prayer.  Chap. Jacque Garrels G. Maitland

## 2015-03-10 NOTE — ED Notes (Signed)
Report called to lisa rn at the [facility] [place].  Ems here for transport now.

## 2015-03-10 NOTE — ED Notes (Signed)
Pt moved to hospital bed from stretcher.  Pt was able to roll herself over.

## 2015-03-10 NOTE — Consult Note (Signed)
[facility] Consult   Reason for Consult:  Consult for this 70 year old woman with a history of schizoaffective disorder who is brought to the hospital after getting in an argument at her rest home Referring Physician:  Thomasene Lot Patient Identification: [name] [name] MRN:  [id number] Principal Diagnosis: Schizoaffective disorder, bipolar type with good prognostic features [facility]) Diagnosis:   Patient Active Problem List   Diagnosis Date Noted  . Schizoaffective disorder, bipolar type ([place]) [F25.0]   . Schizoaffective disorder ([place]) [F25.9] 12/23/2014  . Schizoaffective disorder, bipolar type with good prognostic features ([place]) [F25.0] 10/30/2014  . Urinary incontinence [R32] 10/30/2014  . GERD (gastroesophageal reflux disease) [K21.9] 10/30/2014  . OSA (obstructive sleep apnea) [G47.33] 10/30/2014  . Osteoarthrosis, unspecified whether generalized or localized, involving lower leg [M17.9] 10/30/2014  . COPD (chronic obstructive pulmonary disease) ([place]) [J44.9] 10/30/2014  . Chronic diastolic CHF (congestive heart failure) ([place]) [I50.32] 05/15/2014  . Obesity [E66.9]   . History of colon polyps [Z86.010] 03/09/2013  . Renal mass [N28.89] 06/26/2011  . 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 Hypertension [I10] 06/24/2011  . Diabetes mellitus ([place]) [E11.9] 06/24/2011    Total Time spent with patient: 45 minutes  Subjective:   [name] [name] is a 70 y.o. female patient admitted with "people treat me so bad".  HPI:  Patient interviewed. Chart reviewed. Labs reviewed. 70 year old woman well known to me and to our service here with a history of schizoaffective disorder. She was sent here from her rest home today because she got in an argument this morning that culminated in what they report was her throwing some breakfast food. Patient says that she was angry because they would not allow her to have a cup of juice this morning. Apparently that's actually true, her son  has told the staff to give her less juice in the morning and the patient got angry about it. Patient says that she has repeated arguments with the staff there but totally denies having any suicidal thoughts or any homicidal or aggressive thoughts. Patient is not even presenting as being particularly paranoid. Doesn't make any bizarre statements. Says that she's been compliant with her medicine and that her blood sugars of been doing well. She complains that people at the rest home don't like her and that she is frequently finding herself in conflict with people there. That's quite possibly true and she has insight into it.  Social history: [name] is her guardian. He has gotten her placed in the rest home where she is currently living. She has another adult son as well.  Medical history: Multiple chronic medical problems. Patient is overweight has a history of diabetes congestive heart failure COPD elevated blood pressure recurrent urinary tract infections  Substance abuse history: Patient does not drink or abuse any drugs  Past Psychiatric History: Long history of mental illness going back decades. Multiple hospitalizations. Patient tends to get agitated and paranoid and get in arguments with people and make a noose and sever self with the authorities. She's been doing better since they got her into a rest home facility. Tried to harm herself. Back when she was much younger she could sometimes be aggressive but she no longer has the physical capability  Risk to Self: Suicidal Ideation: No Suicidal Intent: No Is patient at risk for suicide?: No Suicidal Plan?: No Access to Means: No What has been your use of drugs/alcohol within the last 12 months?: denied How many times?:  (unknown) Triggers for Past  Attempts: Unknown Intentional Self Injurious Behavior: None Risk to Others: Homicidal Ideation: No Thoughts of Harm to Others: No Current Homicidal Intent: No Current Homicidal Plan:  No Access to Homicidal Means: No Identified Victim: Denied History of harm to others?:  (Unknown) Assessment of Violence: On admission Violent Behavior Description: aggressive with staff Does patient have access to weapons?: No Criminal Charges Pending?: No Does patient have a court date: No Prior Inpatient Therapy: Prior Inpatient Therapy: Yes Prior Therapy Dates: 2016 Prior Therapy Facilty/Provider(s): [facility] Reason for Treatment: Schizophrenia Prior Outpatient Therapy: Prior Outpatient Therapy: Yes Prior Therapy Dates: Current Prior Therapy Facilty/Provider(s): Unknown Reason for Treatment: Schizophrenia Does patient have an ACCT team?: Yes Does patient have Intensive In-House Services?  : No Does patient have [facility] services? : No Does patient have P4CC services?: No  Past Medical History:  Past Medical History  Diagnosis Date  . DM2 (diabetes mellitus, type 2) ([place])   . Hypertension   . Hypercholesteremia   . Schizophrenia ([place])   . Osteoarthritis   . Bursitis   . OSA (obstructive sleep apnea)     does not use machine  . Left ventricular outflow tract obstruction     a. echo 03/2014: EF 60-65%, hypernamic LV systolic fxn, mod LVH w/ LVOT gradient estimated at 68 mm Hg w/ valsalva, very small LV internal cavity size, mildly increased LV posterior wall thickness, mild Ao valve scl w/o stenosis, diastolic dysfunction, normal RVSP  . LVH (left ventricular hypertrophy)     a. echo suggests long standing uncontrolled htn. she will not do well when dehydrated, LV cavity obliteration  . Obesity   . CHF (congestive heart failure) ([place])   . Anxiety   . COPD (chronic obstructive pulmonary disease) ([place])   . Bronchitis   . CAD (coronary artery disease)   . Tremors of nervous system   . GERD (gastroesophageal reflux disease)   . Environmental allergies   . Arthritis   . Depression   . Chronic kidney disease     shadow on x-ray  . Muscle weakness   . Chronic cough   . Lower  extremity edema   . Wheezing   . On supplemental oxygen therapy     AS NEEDED    Past Surgical History  Procedure Laterality Date  . Tonsillectomy    . Knee reconstruction, medial patellar femoral ligament    . Cholecystectomy    . Colonoscopy  04/2011    [facility] per patient incomplete  . Tonsillectomy    . Cataract extraction w/phaco Left 09/10/2014    Procedure: CATARACT EXTRACTION PHACO AND INTRAOCULAR LENS PLACEMENT (IOC);  Surgeon: Birder Robson, MD;  Location: [facility] ORS;  Service: Ophthalmology;  Laterality: Left;  [place]: 00:44 AP%: 22.8 CDE: 10.24 Fluid lot #[id number]  . Joint replacement      TKR  . Eye surgery    . Cataract extraction w/phaco Right 10/15/2014    Procedure: CATARACT EXTRACTION PHACO AND INTRAOCULAR LENS PLACEMENT (IOC);  Surgeon: Birder Robson, MD;  Location: [facility] ORS;  Service: Ophthalmology;  Laterality: Right;  [place]: 00:52 AP:40.1 CDE:11.83 LOT HBZJ #[id number]   Family History:  Family History  Problem Relation Age of Onset  . Colon cancer Neg Hx   . Liver disease Neg Hx   . Heart attack Mother    Family Psychiatric  History: No known family history of mental illness Social History:  History  Alcohol Use No    Comment: occ.     History  Drug Use No  Social History   Social History  . Marital Status: Widowed    Spouse Name: N/A  . Number of Children: 2  . Years of Education: N/A   Social History Main Topics  . Smoking status: Former Research scientist (life sciences)  . Smokeless tobacco: Never Used  . Alcohol Use: No     Comment: occ.  . Drug Use: No  . Sexual Activity: Not Currently   Other Topics Concern  . None   Social History Narrative   Additional Social History:    History of alcohol / drug use?: No history of alcohol / drug abuse                     Allergies:   Allergies  Allergen Reactions  . Haldol [Haloperidol Decanoate] Swelling and Other (See Comments)    Reaction:  Swelling of tongue and blurred vision   . Metformin Diarrhea   . Raspberry Swelling and Other (See Comments)    Reaction:  Swelling of lips     Labs:  Results for orders placed or performed during the hospital encounter of 03/09/15 (from the past 48 hour(s))  CBC     Status: None   Collection Time: 03/09/15  7:30 PM  Result Value Ref Range   WBC 11.0 3.6 - 11.0 K/uL   RBC 4.69 3.80 - 5.20 MIL/uL   Hemoglobin 12.9 12.0 - 16.0 g/dL   HCT 40.2 35.0 - 47.0 %   MCV 85.7 80.0 - 100.0 fL   MCH 27.5 26.0 - 34.0 pg   MCHC 32.1 32.0 - 36.0 g/dL   RDW 14.1 11.5 - 14.5 %   Platelets 235 150 - 440 K/uL  Comprehensive metabolic panel     Status: None   Collection Time: 03/09/15  7:30 PM  Result Value Ref Range   Sodium 138 135 - 145 mmol/L   Potassium 3.8 3.5 - 5.1 mmol/L   Chloride 102 101 - 111 mmol/L   CO2 31 22 - 32 mmol/L   Glucose, Bld 79 65 - 99 mg/dL   BUN 18 6 - 20 mg/dL   Creatinine, Ser 0.86 0.44 - 1.00 mg/dL   Calcium 9.0 8.9 - 10.3 mg/dL   Total Protein 7.6 6.5 - 8.1 g/dL   Albumin 3.5 3.5 - 5.0 g/dL   AST 20 15 - 41 U/L   ALT 18 14 - 54 U/L   Alkaline Phosphatase 65 38 - 126 U/L   Total Bilirubin 0.3 0.3 - 1.2 mg/dL   GFR calc non Af Amer >60 >60 mL/min   GFR calc Af Amer >60 >60 mL/min    Comment: (NOTE) The eGFR has been calculated using the CKD EPI equation. This calculation has not been validated in all clinical situations. eGFR's persistently <60 mL/min signify possible Chronic Kidney Disease.    Anion gap 5 5 - 15  Urinalysis complete, with microscopic ([facility] only)     Status: Abnormal   Collection Time: 03/09/15  9:19 PM  Result Value Ref Range   Color, Urine YELLOW (A) YELLOW   APPearance CLOUDY (A) CLEAR   Glucose, UA NEGATIVE NEGATIVE mg/dL   Bilirubin Urine NEGATIVE NEGATIVE   Ketones, ur NEGATIVE NEGATIVE mg/dL   Specific Gravity, Urine 1.013 1.005 - 1.030   Hgb urine dipstick NEGATIVE NEGATIVE   pH 6.0 5.0 - 8.0   Protein, ur NEGATIVE NEGATIVE mg/dL   Nitrite POSITIVE (A) NEGATIVE   Leukocytes, UA 3+ (A)  NEGATIVE   RBC / HPF 0-5  0 - 5 RBC/hpf   WBC, UA TOO NUMEROUS TO COUNT 0 - 5 WBC/hpf   Bacteria, UA FEW (A) NONE SEEN   Squamous Epithelial / LPF 0-5 (A) NONE SEEN   WBC Clumps PRESENT   Urine Drug Screen, Qualitative ([facility] only)     Status: Abnormal   Collection Time: 03/09/15  9:19 PM  Result Value Ref Range   Tricyclic, Ur Screen NONE DETECTED NONE DETECTED   Amphetamines, Ur Screen NONE DETECTED NONE DETECTED   MDMA (Ecstasy)Ur Screen NONE DETECTED NONE DETECTED   Cocaine Metabolite,Ur [place] NONE DETECTED NONE DETECTED   Opiate, Ur Screen NONE DETECTED NONE DETECTED   Phencyclidine (PCP) Ur S NONE DETECTED NONE DETECTED   Cannabinoid 50 Ng, Ur [place] NONE DETECTED NONE DETECTED   Barbiturates, Ur Screen NONE DETECTED NONE DETECTED   Benzodiazepine, Ur Scrn POSITIVE (A) NONE DETECTED   Methadone Scn, Ur NONE DETECTED NONE DETECTED    Comment: (NOTE) 169  Tricyclics, urine               Cutoff 1000 ng/mL 200  Amphetamines, urine             Cutoff 1000 ng/mL 300  MDMA (Ecstasy), urine           Cutoff 500 ng/mL 400  Cocaine Metabolite, urine       Cutoff 300 ng/mL 500  Opiate, urine                   Cutoff 300 ng/mL 600  Phencyclidine (PCP), urine      Cutoff 25 ng/mL 700  Cannabinoid, urine              Cutoff 50 ng/mL 800  Barbiturates, urine             Cutoff 200 ng/mL 900  Benzodiazepine, urine           Cutoff 200 ng/mL 1000 Methadone, urine                Cutoff 300 ng/mL 1100 1200 The urine drug screen provides only a preliminary, unconfirmed 1300 analytical test result and should not be used for non-medical 1400 purposes. Clinical consideration and professional judgment should 1500 be applied to any positive drug screen result due to possible 1600 interfering substances. A more specific alternate chemical method 1700 must be used in order to obtain a confirmed analytical result.  1800 Gas chromato graphy / mass spectrometry (GC/MS) is the preferred 1900 confirmatory  method.   Glucose, capillary     Status: Abnormal   Collection Time: 03/09/15 10:38 PM  Result Value Ref Range   Glucose-Capillary 134 (H) 65 - 99 mg/dL  Glucose, capillary     Status: Abnormal   Collection Time: 03/10/15  7:47 AM  Result Value Ref Range   Glucose-Capillary 116 (H) 65 - 99 mg/dL    Current Facility-Administered Medications  Medication Dose Route Frequency Provider Last Rate Last Dose  . albuterol (PROVENTIL HFA;VENTOLIN HFA) 108 (90 Base) MCG/ACT inhaler 2 puff  2 puff Inhalation Q4H PRN Gonzella Lex, MD      . aspirin EC tablet 81 mg  81 mg Oral Daily Gonzella Lex, MD      . Derrill Memo ON 03/11/2015] atorvastatin (LIPITOR) tablet 20 mg  20 mg Oral q1800 Gonzella Lex, MD      . benztropine (COGENTIN) tablet 0.5 mg  0.5 mg Oral BID Gonzella Lex, MD      .  fluPHENAZine (PROLIXIN) tablet 15 mg  15 mg Oral QHS Gonzella Lex, MD      . Derrill Memo ON 03/11/2015] glipiZIDE (GLUCOTROL) tablet 10 mg  10 mg Oral BID AC Gonzella Lex, MD      . hydrocortisone cream 1 %   Topical BID Orbie Pyo, MD   1 application at 13/24/40 [phone]  . insulin glargine (LANTUS) injection 15 Units  15 Units Subcutaneous QHS John T Clapacs, MD      . linagliptin (TRADJENTA) tablet 5 mg  5 mg Oral Daily John T Clapacs, MD      . lisinopril (PRINIVIL,ZESTRIL) tablet 20 mg  20 mg Oral Daily John T Clapacs, MD      . mometasone-formoterol (DULERA) 100-5 MCG/ACT inhaler 2 puff  2 puff Inhalation BID Gonzella Lex, MD      . tiotropium [facility]) inhalation capsule 18 mcg  18 mcg Inhalation Daily Gonzella Lex, MD      . traMADol (ULTRAM) tablet 50 mg  50 mg Oral Q6H PRN Carrie Mew, MD   50 mg at 03/10/15 0455  . verapamil (CALAN) tablet 40 mg  40 mg Oral 3 times per day Gonzella Lex, MD       Current Outpatient Prescriptions  Medication Sig Dispense Refill  . albuterol (PROVENTIL HFA;VENTOLIN HFA) 108 (90 BASE) MCG/ACT inhaler Inhale 2 puffs into the lungs 4 (four) times daily as  needed for wheezing or shortness of breath.    Marland Kitchen aspirin EC 81 MG tablet Take 81 mg by mouth daily.    Marland Kitchen atorvastatin (LIPITOR) 20 MG tablet Take 20 mg by mouth at bedtime.    . benztropine (COGENTIN) 0.5 MG tablet Take 1 tablet (0.5 mg total) by mouth 2 (two) times daily. 60 tablet 0  . betamethasone dipropionate (DIPROLENE) 0.05 % cream Apply topically 2 (two) times daily.    Marland Kitchen dicyclomine (BENTYL) 20 MG tablet Take 1 tablet (20 mg total) by mouth 3 (three) times daily as needed for spasms. (Patient not taking: Reported on 12/24/2014) 30 tablet 0  . dicyclomine (BENTYL) 20 MG tablet Take 20 mg by mouth every 6 (six) hours as needed for spasms.    Marland Kitchen docusate sodium (COLACE) 100 MG capsule Take 2 capsules (200 mg total) by mouth 2 (two) times daily. 120 capsule 0  . fluPHENAZine (PROLIXIN) 10 MG tablet Take 15 mg by mouth at bedtime.     . fluPHENAZine (PROLIXIN) 5 MG tablet Take 3 tablets (15 mg total) by mouth at bedtime. (Patient not taking: Reported on 12/24/2014) 90 tablet 0  . fluPHENAZine (PROLIXIN) 5 MG/ML solution Take 25 mg by mouth every 14 (fourteen) days.    . fluPHENAZine decanoate (PROLIXIN) 25 MG/ML injection Inject 1 mL (25 mg total) into the muscle every 14 (fourteen) days. (Patient not taking: Reported on 12/24/2014) 5 mL 0  . Fluticasone-Salmeterol (ADVAIR) 250-50 MCG/DOSE AEPB Inhale 1 puff into the lungs 2 (two) times daily.    . furosemide (LASIX) 40 MG tablet Take 40 mg by mouth every Monday, Wednesday, and Friday.    Marland Kitchen glipiZIDE (GLUCOTROL) 10 MG tablet Take 10 mg by mouth 2 (two) times daily.    . insulin glargine (LANTUS) 100 UNIT/ML injection Inject 0.15 mLs (15 Units total) into the skin at bedtime. 10 mL 2  . lisinopril (PRINIVIL,ZESTRIL) 20 MG tablet Take 1 tablet (20 mg total) by mouth every morning. 30 tablet 0  . lisinopril (PRINIVIL,ZESTRIL) 5 MG tablet Take 5 mg  by mouth daily.    Marland Kitchen LORazepam (ATIVAN) 0.5 MG tablet Take 0.5 mg by mouth 3 (three) times daily.    .  metoCLOPramide (REGLAN) 10 MG tablet Take 1 tablet (10 mg total) by mouth every 6 (six) hours as needed for nausea or vomiting. (Patient not taking: Reported on 12/24/2014) 12 tablet 1  . metoCLOPramide (REGLAN) 10 MG tablet Take 10 mg by mouth every 6 (six) hours as needed for nausea.    . ranitidine (ZANTAC) 150 MG tablet Take 150 mg by mouth 2 (two) times daily.    . sitaGLIPtin (JANUVIA) 100 MG tablet Take 100 mg by mouth daily.    Marland Kitchen tiotropium (SPIRIVA) 18 MCG inhalation capsule Place 1 capsule (18 mcg total) into inhaler and inhale daily. 30 capsule 12  . traMADol (ULTRAM) 50 MG tablet Take 1 tablet (50 mg total) by mouth every 6 (six) hours as needed. (Patient not taking: Reported on 12/23/2014) 20 tablet 0  . traMADol (ULTRAM) 50 MG tablet Take by mouth every 6 (six) hours as needed.    . verapamil (CALAN) 40 MG tablet Take 40 mg by mouth every 6 (six) hours.      Musculoskeletal: Strength & Muscle Tone: decreased Gait & Station: ataxic Patient leans: N/A  Psychiatric Specialty Exam: Review of Systems  Constitutional: Negative.   HENT: Negative.   Eyes: Negative.   Respiratory: Negative.   Cardiovascular: Negative.   Gastrointestinal: Negative.   Musculoskeletal: Negative.   Skin: Negative.   Neurological: Negative.   Psychiatric/Behavioral: Negative for depression, suicidal ideas, hallucinations, memory loss and substance abuse. The patient is nervous/anxious and has insomnia.     Blood pressure 154/64, pulse 84, temperature 98.1 F (36.7 C), temperature source Oral, resp. rate 19, height '5\' 7"'  (1.702 m), weight 127.007 kg (280 lb), SpO2 98 %.Body mass index is 43.84 kg/(m^2).  General Appearance: Disheveled  Eye Sport and exercise psychologist::  Fair  Speech:  Garbled  Volume:  Normal  Mood:  Anxious  Affect:  Tearful  Thought Process:  Goal Directed  Orientation:  Full (Time, Place, and Person)  Thought Content:  Negative  Suicidal Thoughts:  No  Homicidal Thoughts:  No  Memory:   Immediate;   Fair Recent;   Fair Remote;   Fair  Judgement:  Fair  Insight:  Fair  Psychomotor Activity:  Decreased  Concentration:  Fair  Recall:  [employer] of Knowledge:Fair  Language: Fair  Akathisia:  No  Handed:  Right  AIMS (if indicated):     Assets:  Financial Resources/Insurance Housing Resilience Social Support  ADL's:  Intact  Cognition: WNL  Sleep:      Treatment Plan Summary: Daily contact with patient to assess and evaluate symptoms and progress in treatment, Medication management and Plan 70 year old woman with long history of mental illness. To my evaluation today she appears to be at her baseline. There is no evidence that she is actually threatening or dangerous to anyone else. No suicidality. Patient does not require inpatient psychiatric hospitalization. Supportive counseling completed with the patient. I've continued all of her medications as best I can. I have talked with TTS and asked that we get some help in sending her back to her rest home. IVC discontinued. Supportive counseling done with the patient. She is also getting some support from seeing chaplain services and reading her Bible as usual. No other change to treatment now. Case reviewed with emergency room physician.  Disposition: Patient does not meet criteria for psychiatric inpatient admission. Supportive  therapy provided about ongoing stressors.  John Clapacs 03/10/2015 6:57 PM

## 2015-03-10 NOTE — ED Notes (Signed)
Pt awakens ealsiy.  Says she feels great, but is concerned that she cannot walk anymore.  Will attempt to get her up.

## 2015-03-10 NOTE — ED Provider Notes (Signed)
-----------------------------------------   7:39 PM on 03/10/2015 -----------------------------------------   Patient has been seen by Dr. Weber Cooks, titrate. He reports that the patient is in her usual state. She is schizophrenic but not suicidal. She does not meet criteria for ongoing involuntary commitment. From a psychiatric point of view, she may be returned to the group home she usually resides in.  On my examination, the patient is pleasant and calm. She smiles and reports she remembers meeting me before. She tells me that she is here because of a lye, that she had not been aggressive or agitated. She asked that I would call her son to ask him to look for a facility in [place] near him and her grandchildren.   Overall, she is cooperative and more appropriate than I have seen her in the past. I will call her son but will also attempt to arrange her return to her current group home.  ----------------------------------------- 8:58 PM on 03/10/2015 -----------------------------------------  I then able to speak with the patient's son [name]. I was also able to let him speak with the patient. The patient was quite pleased and almost laded to speak with her son. She continues to be calm and cooperative.  Her son agrees to the current plan. We will be in touch with the group home to see if she can return tonight or tomorrow morning.     Ahmed Prima, MD 03/10/15 2109

## 2015-03-10 NOTE — Discharge Instructions (Signed)
Continue the patient's current medications. Stay in touch with the ACT team for support and additional assistance. Return to the emergency department if there is any threatening behavior or other urgent concerns.

## 2015-03-10 NOTE — ED Notes (Signed)
Pt awake and a

## 2015-03-10 NOTE — Progress Notes (Signed)
°   03/10/15 0100  Clinical Encounter Type  Visited With Patient  Visit Type Initial  Referral From Nurse  Consult/Referral To Chaplain  Chaplain responded to page after patient requested a visit.  Patient was sleeping when I arrived.  Will follow-up. [place] Ext [phone]

## 2015-03-10 NOTE — ED Notes (Signed)
Attempted to call report to [facility]. No answer at this time.
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
## 2015-03-10 NOTE — ED Notes (Signed)
Pharmacy called and notified of need of lisinopril and tiotropium. States they will send it up.

## 2015-03-10 NOTE — BHH Counselor (Signed)
TC from Dr Weber Cooks at [facility] ED who reports that pt needs to be sent back to her assisted living facility [facility]. She is psychiatrically cleared. Writer then left voicemail for [facility] ED social worker at [phone] notifying CSW that arrangements will need to be made for pt to go back to [place].  Arnold Long, [place] Therapeutic Triage Specialist

## 2015-03-22 ENCOUNTER — Encounter: Payer: Self-pay | Admitting: Emergency Medicine

## 2015-03-22 ENCOUNTER — Emergency Department
Admission: EM | Admit: 2015-03-22 | Discharge: 2015-03-23 | Disposition: A | Discharge status: Home / Self Care | Payer: Medicare HMO | Attending: Emergency Medicine | Admitting: Emergency Medicine

## 2015-03-22 DIAGNOSIS — N390 Urinary tract infection, site not specified: Secondary | ICD-10-CM | POA: Insufficient documentation

## 2015-03-22 DIAGNOSIS — E119 Type 2 diabetes mellitus without complications: Secondary | ICD-10-CM | POA: Insufficient documentation

## 2015-03-22 DIAGNOSIS — N189 Chronic kidney disease, unspecified: Secondary | ICD-10-CM | POA: Diagnosis not present

## 2015-03-22 DIAGNOSIS — I129 Hypertensive chronic kidney disease with stage 1 through stage 4 chronic kidney disease, or unspecified chronic kidney disease: Secondary | ICD-10-CM | POA: Diagnosis not present

## 2015-03-22 DIAGNOSIS — Z87891 Personal history of nicotine dependence: Secondary | ICD-10-CM | POA: Diagnosis not present

## 2015-03-22 DIAGNOSIS — Z794 Long term (current) use of insulin: Secondary | ICD-10-CM | POA: Diagnosis not present

## 2015-03-22 DIAGNOSIS — Z7984 Long term (current) use of oral hypoglycemic drugs: Secondary | ICD-10-CM | POA: Insufficient documentation

## 2015-03-22 DIAGNOSIS — Z79899 Other long term (current) drug therapy: Secondary | ICD-10-CM | POA: Diagnosis not present

## 2015-03-22 DIAGNOSIS — Z7951 Long term (current) use of inhaled steroids: Secondary | ICD-10-CM | POA: Insufficient documentation

## 2015-03-22 DIAGNOSIS — Z7982 Long term (current) use of aspirin: Secondary | ICD-10-CM | POA: Insufficient documentation

## 2015-03-22 LAB — CBC
HCT: 41.6 % (ref 35.0–47.0)
Hemoglobin: 13.4 g/dL (ref 12.0–16.0)
MCH: 27 pg (ref 26.0–34.0)
MCHC: 32.2 g/dL (ref 32.0–36.0)
MCV: 83.9 fL (ref 80.0–100.0)
PLATELETS: 266 10*3/uL (ref 150–440)
RBC: 4.95 MIL/uL (ref 3.80–5.20)
RDW: 13.7 % (ref 11.5–14.5)
WBC: 13.5 10*3/uL — AB (ref 3.6–11.0)

## 2015-03-22 LAB — COMPREHENSIVE METABOLIC PANEL
ALT: 20 U/L (ref 14–54)
ANION GAP: 11 (ref 5–15)
AST: 20 U/L (ref 15–41)
Albumin: 3.7 g/dL (ref 3.5–5.0)
Alkaline Phosphatase: 82 U/L (ref 38–126)
BUN: 19 mg/dL (ref 6–20)
CHLORIDE: 101 mmol/L (ref 101–111)
CO2: 27 mmol/L (ref 22–32)
CREATININE: 0.96 mg/dL (ref 0.44–1.00)
Calcium: 9.3 mg/dL (ref 8.9–10.3)
GFR, EST NON AFRICAN AMERICAN: 59 mL/min — AB (ref >60)
Glucose, Bld: 160 mg/dL — ABNORMAL HIGH (ref 65–99)
POTASSIUM: 4.1 mmol/L (ref 3.5–5.1)
Sodium: 139 mmol/L (ref 135–145)
Total Bilirubin: 0.4 mg/dL (ref 0.3–1.2)
Total Protein: 8.4 g/dL — ABNORMAL HIGH (ref 6.5–8.1)

## 2015-03-22 LAB — URINALYSIS COMPLETE WITH MICROSCOPIC (ARMC ONLY)
Bilirubin Urine: NEGATIVE
Glucose, UA: NEGATIVE mg/dL
Hgb urine dipstick: NEGATIVE
KETONES UR: NEGATIVE mg/dL
NITRITE: POSITIVE — AB
PH: 6 (ref 5.0–8.0)
PROTEIN: NEGATIVE mg/dL
Specific Gravity, Urine: 1.018 (ref 1.005–1.030)

## 2015-03-22 LAB — URINE DRUG SCREEN, QUALITATIVE (ARMC ONLY)
Amphetamines, Ur Screen: NOT DETECTED
Barbiturates, Ur Screen: NOT DETECTED
Benzodiazepine, Ur Scrn: NOT DETECTED
CANNABINOID 50 NG, UR ~~LOC~~: NOT DETECTED
Cocaine Metabolite,Ur ~~LOC~~: NOT DETECTED
MDMA (ECSTASY) UR SCREEN: NOT DETECTED
Methadone Scn, Ur: NOT DETECTED
Opiate, Ur Screen: NOT DETECTED
Phencyclidine (PCP) Ur S: NOT DETECTED
TRICYCLIC, UR SCREEN: NOT DETECTED

## 2015-03-22 LAB — ETHANOL

## 2015-03-22 LAB — SALICYLATE LEVEL

## 2015-03-22 LAB — ACETAMINOPHEN LEVEL

## 2015-03-22 MED ORDER — CEPHALEXIN 500 MG PO CAPS: 500.0000 mg | ORAL_CAPSULE | Freq: Four times a day (QID) | ORAL | Status: AC

## 2015-03-22 MED ORDER — CEPHALEXIN 500 MG PO CAPS
500.0000 mg | ORAL_CAPSULE | Freq: Four times a day (QID) | ORAL | Status: DC
  Administered 2015-03-23: 500 mg via ORAL
  Filled 2015-03-22: qty 1

## 2015-03-22 NOTE — ED Notes (Signed)
Pt to be discharged, spoke with brittany, med tech at the [facility]. [place] states "i need to talk to Buyer, retail, because i don't think we can take her back." dawn tulloch, rn previously spoke with kasey at [facility] regarding legality of refusal to "take her back." brittany provided with phone number for dss. [place] informs this rn she will call after speaking with her Mudlogger.
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
## 2015-03-22 NOTE — ED Notes (Signed)
Pt ambulatory with walker. Pt alert, cooperative.

## 2015-03-22 NOTE — ED Notes (Signed)
Report received from [place], rn.

## 2015-03-22 NOTE — ED Notes (Signed)
Meal tray provided.

## 2015-03-22 NOTE — ED Notes (Signed)
BEHAVIORAL HEALTH ROUNDING Patient sleeping: No. Patient alert and oriented: yes Behavior appropriate: Yes.  ; If no, describe:  Nutrition and fluids offered: Yes  Toileting and hygiene offered: Yes  Sitter present: yes Law enforcement present: Yes  

## 2015-03-22 NOTE — ED Notes (Signed)
Pt watching tv in no acute distress.  

## 2015-03-22 NOTE — BHH Counselor (Signed)
Writer attempting to contact Pt retirement community [facility] [phone]) per [place] request.

## 2015-03-22 NOTE — Progress Notes (Signed)
°   03/22/15 1900  Clinical Encounter Type  Visited With Patient  Visit Type Follow-up  Referral From Nurse  Consult/Referral To Chaplain  Spiritual Encounters  Spiritual Needs Prayer  Stress Factors  Patient Stress Factors Family relationships  Chaplain responded to patient request for chaplain.  Offered prayer and comfort. [place] Ext [phone]
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
## 2015-03-22 NOTE — ED Notes (Signed)
Patient taken to shower.
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
## 2015-03-23 DIAGNOSIS — N390 Urinary tract infection, site not specified: Secondary | ICD-10-CM | POA: Diagnosis not present

## 2015-03-23 NOTE — ED Notes (Signed)
Pt up to restroom to void.  

## 2015-03-23 NOTE — ED Notes (Signed)
Report to brittany at the [facility] [place]. Pt to be discharged back to [facility].

## 2015-03-24 ENCOUNTER — Emergency Department
Admission: EM | Admit: 2015-03-24 | Discharge: 2015-03-25 | Disposition: A | Discharge status: Home / Self Care | Payer: Medicare HMO | Attending: Emergency Medicine | Admitting: Emergency Medicine

## 2015-03-24 ENCOUNTER — Encounter: Payer: Self-pay | Admitting: Emergency Medicine

## 2015-03-24 DIAGNOSIS — E119 Type 2 diabetes mellitus without complications: Secondary | ICD-10-CM | POA: Diagnosis not present

## 2015-03-24 DIAGNOSIS — Z7982 Long term (current) use of aspirin: Secondary | ICD-10-CM | POA: Insufficient documentation

## 2015-03-24 DIAGNOSIS — Z794 Long term (current) use of insulin: Secondary | ICD-10-CM | POA: Insufficient documentation

## 2015-03-24 DIAGNOSIS — R4689 Other symptoms and signs involving appearance and behavior: Secondary | ICD-10-CM

## 2015-03-24 DIAGNOSIS — R451 Restlessness and agitation: Secondary | ICD-10-CM | POA: Diagnosis present

## 2015-03-24 DIAGNOSIS — Z792 Long term (current) use of antibiotics: Secondary | ICD-10-CM | POA: Insufficient documentation

## 2015-03-24 DIAGNOSIS — N189 Chronic kidney disease, unspecified: Secondary | ICD-10-CM | POA: Diagnosis not present

## 2015-03-24 DIAGNOSIS — N390 Urinary tract infection, site not specified: Secondary | ICD-10-CM | POA: Diagnosis not present

## 2015-03-24 DIAGNOSIS — Z7951 Long term (current) use of inhaled steroids: Secondary | ICD-10-CM | POA: Insufficient documentation

## 2015-03-24 DIAGNOSIS — F911 Conduct disorder, childhood-onset type: Secondary | ICD-10-CM | POA: Insufficient documentation

## 2015-03-24 DIAGNOSIS — Z87891 Personal history of nicotine dependence: Secondary | ICD-10-CM | POA: Insufficient documentation

## 2015-03-24 DIAGNOSIS — I129 Hypertensive chronic kidney disease with stage 1 through stage 4 chronic kidney disease, or unspecified chronic kidney disease: Secondary | ICD-10-CM | POA: Insufficient documentation

## 2015-03-24 LAB — COMPREHENSIVE METABOLIC PANEL
ALBUMIN: 3.8 g/dL (ref 3.5–5.0)
ALK PHOS: 89 U/L (ref 38–126)
ALT: 22 U/L (ref 14–54)
ANION GAP: 10 (ref 5–15)
AST: 23 U/L (ref 15–41)
BILIRUBIN TOTAL: 0.3 mg/dL (ref 0.3–1.2)
BUN: 20 mg/dL (ref 6–20)
CALCIUM: 9.6 mg/dL (ref 8.9–10.3)
CO2: 30 mmol/L (ref 22–32)
Chloride: 100 mmol/L — ABNORMAL LOW (ref 101–111)
Creatinine, Ser: 0.88 mg/dL (ref 0.44–1.00)
GFR calc Af Amer: >60 mL/min (ref >60)
GFR calc non Af Amer: >60 mL/min (ref >60)
GLUCOSE: 219 mg/dL — AB (ref 65–99)
POTASSIUM: 3.9 mmol/L (ref 3.5–5.1)
SODIUM: 140 mmol/L (ref 135–145)
Total Protein: 8.5 g/dL — ABNORMAL HIGH (ref 6.5–8.1)

## 2015-03-24 LAB — CBC WITH DIFFERENTIAL/PLATELET
BASOS ABS: 0.1 10*3/uL (ref 0–0.1)
BASOS PCT: 1 %
EOS ABS: 0.3 10*3/uL (ref 0–0.7)
Eosinophils Relative: 2 %
HEMATOCRIT: 44 % (ref 35.0–47.0)
HEMOGLOBIN: 14.1 g/dL (ref 12.0–16.0)
Lymphocytes Relative: 20 %
Lymphs Abs: 2.7 10*3/uL (ref 1.0–3.6)
MCH: 27.3 pg (ref 26.0–34.0)
MCHC: 32.2 g/dL (ref 32.0–36.0)
MCV: 84.8 fL (ref 80.0–100.0)
MONOS PCT: 10 %
Monocytes Absolute: 1.3 10*3/uL — ABNORMAL HIGH (ref 0.2–0.9)
NEUTROS ABS: 9 10*3/uL — AB (ref 1.4–6.5)
NEUTROS PCT: 67 %
Platelets: 253 10*3/uL (ref 150–440)
RBC: 5.18 MIL/uL (ref 3.80–5.20)
RDW: 13.9 % (ref 11.5–14.5)
WBC: 13.4 10*3/uL — AB (ref 3.6–11.0)

## 2015-03-24 LAB — URINALYSIS COMPLETE WITH MICROSCOPIC (ARMC ONLY)
Bilirubin Urine: NEGATIVE
Glucose, UA: NEGATIVE mg/dL
Hgb urine dipstick: NEGATIVE
Ketones, ur: NEGATIVE mg/dL
Nitrite: NEGATIVE
PH: 5 (ref 5.0–8.0)
PROTEIN: NEGATIVE mg/dL
Specific Gravity, Urine: 1.008 (ref 1.005–1.030)

## 2015-03-24 LAB — SALICYLATE LEVEL

## 2015-03-24 LAB — URINE DRUG SCREEN, QUALITATIVE (ARMC ONLY)
AMPHETAMINES, UR SCREEN: NOT DETECTED
BENZODIAZEPINE, UR SCRN: NOT DETECTED
Barbiturates, Ur Screen: NOT DETECTED
Cannabinoid 50 Ng, Ur ~~LOC~~: NOT DETECTED
Cocaine Metabolite,Ur ~~LOC~~: NOT DETECTED
MDMA (Ecstasy)Ur Screen: NOT DETECTED
METHADONE SCREEN, URINE: NOT DETECTED
Opiate, Ur Screen: NOT DETECTED
PHENCYCLIDINE (PCP) UR S: NOT DETECTED
TRICYCLIC, UR SCREEN: NOT DETECTED

## 2015-03-24 LAB — ACETAMINOPHEN LEVEL

## 2015-03-24 LAB — ETHANOL

## 2015-03-24 MED ORDER — CEPHALEXIN 500 MG PO CAPS
500.0000 mg | ORAL_CAPSULE | Freq: Three times a day (TID) | ORAL | Status: DC
Start: 2015-03-24 — End: 2015-03-25
  Administered 2015-03-24: 500 mg via ORAL
  Filled 2015-03-24: qty 1

## 2015-03-24 NOTE — ED Notes (Signed)
[place] to find out why the pt had been sent to the ER. Gaylene Brooks, Med Tech at the facility, reported that the pt was being difficult and combative towards other residents and staff recently and that there was a Dr order written to have the pt taken "to the ER for combative behavior".

## 2015-03-24 NOTE — BH Assessment (Signed)
Assessment Note  [name] [name] is an 70 y.o. female. [name] [name] reports to the ED stating that people are trying to be mean to her. She states the people at the home are being to her by sending her here to the hospital.  She denied symptoms of depression or anxiety. She denied having auditory or visual hallucinations. She denied having suicidal or homicidal ideation or intent. [name] [name] is her guardian - [phone]. TTS spoke with [name] [name] at the [facility] She states that [name] [name] was aggressive at the facility towards staff and residents  Diagnosis:   Past Medical History:  Past Medical History  Diagnosis Date  . DM2 (diabetes mellitus, type 2) ([place])   . Hypertension   . Hypercholesteremia   . Schizophrenia ([place])   . Osteoarthritis   . Bursitis   . OSA (obstructive sleep apnea)     does not use machine  . Left ventricular outflow tract obstruction     a. echo 03/2014: EF 60-65%, hypernamic LV systolic fxn, mod LVH w/ LVOT gradient estimated at 68 mm Hg w/ valsalva, very small LV internal cavity size, mildly increased LV posterior wall thickness, mild Ao valve scl w/o stenosis, diastolic dysfunction, normal RVSP  . LVH (left ventricular hypertrophy)     a. echo suggests long standing uncontrolled htn. she will not do well when dehydrated, LV cavity obliteration  . Obesity   . CHF (congestive heart failure) ([place])   . Anxiety   . COPD (chronic obstructive pulmonary disease) ([place])   . Bronchitis   . CAD (coronary artery disease)   . Tremors of nervous system   . GERD (gastroesophageal reflux disease)   . Environmental allergies   . Arthritis   . Depression   . Chronic kidney disease     shadow on x-ray  . Muscle weakness   . Chronic cough   . Lower extremity edema   . Wheezing   . On supplemental oxygen therapy     AS NEEDED    Past Surgical History  Procedure Laterality Date  . Tonsillectomy    . Knee reconstruction, medial patellar femoral ligament    .  Cholecystectomy    . Colonoscopy  04/2011    [facility] per patient incomplete  . Tonsillectomy    . Cataract extraction w/phaco Left 09/10/2014    Procedure: CATARACT EXTRACTION PHACO AND INTRAOCULAR LENS PLACEMENT (IOC);  Surgeon: Birder Robson, MD;  Location: [facility] ORS;  Service: Ophthalmology;  Laterality: Left;  [place]: 00:44 AP%: 22.8 CDE: 10.24 Fluid lot [id number]  . Joint replacement      TKR  . Eye surgery    . Cataract extraction w/phaco Right 10/15/2014    Procedure: CATARACT EXTRACTION PHACO AND INTRAOCULAR LENS PLACEMENT (IOC);  Surgeon: Birder Robson, MD;  Location: [facility] ORS;  Service: Ophthalmology;  Laterality: Right;  [place]: 00:52 AP:40.1 CDE:11.83 LOT [id number]    Family History:  Family History  Problem Relation Age of Onset  . Colon cancer Neg Hx   . Liver disease Neg Hx   . Heart attack Mother     Social History:  reports that she has quit smoking. She has never used smokeless tobacco. She reports that she does not drink alcohol or use illicit drugs.  Additional Social History:     CIWA: CIWA-Ar BP: (!) 151/127 mmHg Pulse Rate: (!) 114 COWS:    Allergies:  Allergies  Allergen Reactions  . Haldol [Haloperidol Decanoate] Swelling and Other (See Comments)  Reaction:  Swelling of tongue and blurred vision   . Metformin Diarrhea  . Raspberry Swelling and Other (See Comments)    Reaction:  Swelling of lips     Home Medications:  (Not in a hospital admission)  OB/GYN Status:  No LMP recorded. Patient is postmenopausal.  General Assessment Data Location of Assessment: [facility] ED TTS Assessment: In system Is this a Tele or Face-to-Face Assessment?: Face-to-Face Is this an Initial Assessment or a Re-assessment for this encounter?: Initial Assessment Marital status: Single Maiden name: [place]i Is patient pregnant?: No Pregnancy Status: No Living Arrangements: Group Home ([place] [phone]) Can pt return to current living arrangement?:  Yes Admission Status: Voluntary Is patient capable of signing voluntary admission?: No Referral Source: Self/Family/Friend Insurance type: Medicare  Medical Screening Exam ([place]) Medical Exam completed: Yes  Crisis Care Plan Living Arrangements: Group Home ([place]) Legal Guardian: Other relative (Son [name] [name]) Name of Psychiatrist: [facility] - unknown  Education Status Is patient currently in school?: No Current Grade: n/a Highest grade of school patient has completed: 12th Name of school: [employer] person: n/a  Risk to self with the past 6 months Suicidal Ideation: No Has patient been a risk to self within the past 6 months prior to admission? : No Suicidal Intent: No Has patient had any suicidal intent within the past 6 months prior to admission? : No Is patient at risk for suicide?: No Suicidal Plan?: No Has patient had any suicidal plan within the past 6 months prior to admission? : No Access to Means: No What has been your use of drugs/alcohol within the last 12 months?: denied Previous Attempts/Gestures: No How many times?: 0 Other Self Harm Risks: denied Triggers for Past Attempts: Unknown Intentional Self Injurious Behavior: None Family Suicide History: Unknown Recent stressful life event(s): Conflict (Comment) (conflict with facility staff) Persecutory voices/beliefs?: No Depression: No Depression Symptoms:  (None) Substance abuse history and/or treatment for substance abuse?: No Suicide prevention information given to non-admitted patients: Not applicable  Risk to Others within the past 6 months Homicidal Ideation: No Does patient have any lifetime risk of violence toward others beyond the six months prior to admission? : No Thoughts of Harm to Others: No Current Homicidal Intent: No Current Homicidal Plan: No Access to Homicidal Means: No Identified Victim: Denied History of harm to others?:  No Assessment of Violence: On admission Violent Behavior Description: Reportedly aggressive at the facility, calm at the hospital Does patient have access to weapons?: No Criminal Charges Pending?: No Does patient have a court date: No Is patient on probation?: No  Psychosis Hallucinations: None noted Delusions: None noted  Mental Status Report Appearance/Hygiene: In scrubs, Unremarkable Eye Contact: Poor Motor Activity: Unremarkable Speech: Logical/coherent, Loud Level of Consciousness: Drowsy Mood: Irritable Affect: Irritable Anxiety Level: None Thought Processes: Coherent Judgement: Unimpaired Orientation: Place, Time, Situation Obsessive Compulsive Thoughts/Behaviors: None  Cognitive Functioning Concentration: Normal Memory: Recent Intact IQ: Average Insight: Fair Impulse Control: Fair Appetite: Good Sleep: No Change Vegetative Symptoms: None  ADLScreening [facility] Assessment Services) Patient's cognitive ability adequate to safely complete daily activities?: Yes Patient able to express need for assistance with ADLs?: Yes Independently performs ADLs?: Yes (appropriate for developmental age)  Prior Inpatient Therapy Prior Inpatient Therapy: Yes Prior Therapy Dates: 2016 Prior Therapy Facilty/Provider(s): [facility] Reason for Treatment: Schizophrenia  Prior Outpatient Therapy Prior Outpatient Therapy: Yes Prior Therapy Dates: Current Prior Therapy Facilty/Provider([facility]): [facility] Reason for Treatment: Schizophrenia  Does patient have an ACCT team?: Yes Does patient have Intensive In-House Services?  : No Does patient have [facility] services? : No Does patient have P4CC services?: No  ADL Screening (condition at time of admission) Patient's cognitive ability adequate to safely complete daily activities?: Yes Patient able to express need for assistance with ADLs?: Yes Independently performs ADLs?: Yes (appropriate for developmental age)             Advance  Directives ([place]) Does patient have an advance directive?: No    Additional Information 1:1 In Past 12 Months?: No CIRT Risk: No Elopement Risk: No Does patient have medical clearance?: Yes     Disposition:  Disposition Initial Assessment Completed for this Encounter: Yes Disposition of Patient: Other dispositions  On Site Evaluation by:   Reviewed with Physician:    Elmer Bales 03/24/2015 11:16 PM

## 2015-03-24 NOTE — ED Provider Notes (Addendum)
[facility] Emergency Department Provider Note  ____________________________________________  Time seen: Approximately 840 PM  I have reviewed the triage vital signs and the nursing notes.   HISTORY  Chief Complaint No chief complaint on file.    HPI [name] [name] is a 70 y.o. female with a history of schizophrenia who is presenting today from the [place] because of agitation and combative behavior. Here in the emergency department she has no complaints and is not sure why she is here. She denies any hallucinations. Denies any suicidal or homicidal ideation.   Past Medical History  Diagnosis Date  . DM2 (diabetes mellitus, type 2) ([place])   . Hypertension   . Hypercholesteremia   . Schizophrenia ([place])   . Osteoarthritis   . Bursitis   . OSA (obstructive sleep apnea)     does not use machine  . Left ventricular outflow tract obstruction     a. echo 03/2014: EF 60-65%, hypernamic LV systolic fxn, mod LVH w/ LVOT gradient estimated at 68 mm Hg w/ valsalva, very small LV internal cavity size, mildly increased LV posterior wall thickness, mild Ao valve scl w/o stenosis, diastolic dysfunction, normal RVSP  . LVH (left ventricular hypertrophy)     a. echo suggests long standing uncontrolled htn. she will not do well when dehydrated, LV cavity obliteration  . Obesity   . CHF (congestive heart failure) ([place])   . Anxiety   . COPD (chronic obstructive pulmonary disease) ([place])   . Bronchitis   . CAD (coronary artery disease)   . Tremors of nervous system   . GERD (gastroesophageal reflux disease)   . Environmental allergies   . Arthritis   . Depression   . Chronic kidney disease     shadow on x-ray  . Muscle weakness   . Chronic cough   . Lower extremity edema   . Wheezing   . On supplemental oxygen therapy     AS NEEDED    Patient Active Problem List   Diagnosis Date Noted  . Schizoaffective disorder, bipolar type ([place])   . Schizoaffective disorder  ([place]) 12/23/2014  . Schizoaffective disorder, bipolar type with good prognostic features ([place]) 10/30/2014  . Urinary incontinence 10/30/2014  . GERD (gastroesophageal reflux disease) 10/30/2014  . OSA (obstructive sleep apnea) 10/30/2014  . Osteoarthrosis, unspecified whether generalized or localized, involving lower leg 10/30/2014  . COPD (chronic obstructive pulmonary disease) ([place]) 10/30/2014  . Chronic diastolic CHF (congestive heart failure) ([place]) 05/15/2014  . Obesity   . History of colon polyps 03/09/2013  . Renal mass 06/26/2011  . Lytic bone lesion of hip 06/25/2011  . Hypertension 06/24/2011  . Diabetes mellitus ([place]) 06/24/2011    Past Surgical History  Procedure Laterality Date  . Tonsillectomy    . Knee reconstruction, medial patellar femoral ligament    . Cholecystectomy    . Colonoscopy  04/2011    [facility] per patient incomplete  . Tonsillectomy    . Cataract extraction w/phaco Left 09/10/2014    Procedure: CATARACT EXTRACTION PHACO AND INTRAOCULAR LENS PLACEMENT (IOC);  Surgeon: Birder Robson, MD;  Location: [facility] ORS;  Service: Ophthalmology;  Laterality: Left;  [place]: 00:44 AP%: 22.8 CDE: 10.24 Fluid lot [id number] H  . Joint replacement      TKR  . Eye surgery    . Cataract extraction w/phaco Right 10/15/2014    Procedure: CATARACT EXTRACTION PHACO AND INTRAOCULAR LENS PLACEMENT (IOC);  Surgeon: Birder Robson, MD;  Location: [facility] ORS;  Service: Ophthalmology;  Laterality: Right;  [place]: 00:52 AP:40.1 CDE:11.83 [place] [id number]    Current Outpatient Rx  Name  Route  Sig  Dispense  Refill  . albuterol (PROVENTIL HFA;VENTOLIN HFA) 108 (90 BASE) MCG/ACT inhaler   Inhalation   Inhale 2 puffs into the lungs 4 (four) times daily as needed for wheezing or shortness of breath.         Marland Kitchen aspirin EC 81 MG tablet   Oral   Take 81 mg by mouth daily.         Marland Kitchen atorvastatin (LIPITOR) 20 MG tablet   Oral   Take 20 mg by mouth at bedtime.         . benztropine  (COGENTIN) 0.5 MG tablet   Oral   Take 1 tablet (0.5 mg total) by mouth 2 (two) times daily.   60 tablet   0   . betamethasone dipropionate (DIPROLENE) 0.05 % cream   Topical   Apply topically 2 (two) times daily.         . cephALEXin (KEFLEX) 500 MG capsule   Oral   Take 1 capsule (500 mg total) by mouth 4 (four) times daily.   28 capsule   0   . citalopram (CELEXA) 10 MG tablet   Oral   Take 10 mg by mouth at bedtime.         . dicyclomine (BENTYL) 20 MG tablet   Oral   Take 1 tablet (20 mg total) by mouth 3 (three) times daily as needed for spasms. Patient not taking: Reported on 12/24/2014   30 tablet   0   . dicyclomine (BENTYL) 20 MG tablet   Oral   Take 20 mg by mouth every 6 (six) hours as needed for spasms.         Marland Kitchen docusate sodium (COLACE) 100 MG capsule   Oral   Take 2 capsules (200 mg total) by mouth 2 (two) times daily.   120 capsule   0   . fluPHENAZine (PROLIXIN) 10 MG tablet   Oral   Take 15 mg by mouth at bedtime.          . fluPHENAZine (PROLIXIN) 5 MG tablet   Oral   Take 3 tablets (15 mg total) by mouth at bedtime. Patient not taking: Reported on 12/24/2014   90 tablet   0   . fluPHENAZine (PROLIXIN) 5 MG/ML solution   Oral   Take 25 mg by mouth every 14 (fourteen) days.         . fluPHENAZine decanoate (PROLIXIN) 25 MG/ML injection   Intramuscular   Inject 1 mL (25 mg total) into the muscle every 14 (fourteen) days. Patient not taking: Reported on 12/24/2014   5 mL   0     Due on 9/22   . Fluticasone-Salmeterol (ADVAIR) 250-50 MCG/DOSE AEPB   Inhalation   Inhale 1 puff into the lungs 2 (two) times daily.         . furosemide (LASIX) 40 MG tablet   Oral   Take 40 mg by mouth every Monday, Wednesday, and Friday.         Marland Kitchen glipiZIDE (GLUCOTROL) 10 MG tablet   Oral   Take 10 mg by mouth 2 (two) times daily.         . insulin glargine (LANTUS) 100 UNIT/ML injection   Subcutaneous   Inject 0.15 mLs (15 Units  total) into the skin at bedtime.   10 mL   2   . lisinopril (PRINIVIL,ZESTRIL)  20 MG tablet   Oral   Take 1 tablet (20 mg total) by mouth every morning.   30 tablet   0   . lisinopril (PRINIVIL,ZESTRIL) 5 MG tablet   Oral   Take 5 mg by mouth daily.         Marland Kitchen LORazepam (ATIVAN) 0.5 MG tablet   Oral   Take 0.5 mg by mouth 3 (three) times daily.         . metoCLOPramide (REGLAN) 10 MG tablet   Oral   Take 1 tablet (10 mg total) by mouth every 6 (six) hours as needed for nausea or vomiting. Patient not taking: Reported on 12/24/2014   12 tablet   1   . metoCLOPramide (REGLAN) 10 MG tablet   Oral   Take 10 mg by mouth every 6 (six) hours as needed for nausea.         . ranitidine (ZANTAC) 150 MG tablet   Oral   Take 150 mg by mouth 2 (two) times daily.         . sitaGLIPtin (JANUVIA) 100 MG tablet   Oral   Take 100 mg by mouth daily.         Marland Kitchen tiotropium (SPIRIVA) 18 MCG inhalation capsule   Inhalation   Place 1 capsule (18 mcg total) into inhaler and inhale daily.   30 capsule   12   . traMADol (ULTRAM) 50 MG tablet   Oral   Take 1 tablet (50 mg total) by mouth every 6 (six) hours as needed. Patient not taking: Reported on 12/23/2014   20 tablet   0   . traMADol (ULTRAM) 50 MG tablet   Oral   Take by mouth every 6 (six) hours as needed.         . verapamil (CALAN) 40 MG tablet   Oral   Take 40 mg by mouth every 6 (six) hours.           Allergies Haldol; Metformin; and Raspberry  Family History  Problem Relation Age of Onset  . Colon cancer Neg Hx   . Liver disease Neg Hx   . Heart attack Mother     Social History Social History  Substance Use Topics  . Smoking status: Former Research scientist (life sciences)  . Smokeless tobacco: Never Used  . Alcohol Use: No     Comment: occ.    Review of Systems Constitutional: No fever/chills Eyes: No visual changes. ENT: No sore throat. Cardiovascular: Denies chest pain. Respiratory: Denies shortness of  breath. Gastrointestinal: No abdominal pain.  No nausea, no vomiting.  No diarrhea.  No constipation. Genitourinary: Says was just diagnosed with a urinary tract infection. Denies any dysuria. Musculoskeletal: Negative for back pain. Skin: Negative for rash. Neurological: Negative for headaches, focal weakness or numbness.  10-point ROS otherwise negative.  ____________________________________________   PHYSICAL EXAM:  VITAL SIGNS: ED Triage Vitals  Enc Vitals Group     BP 03/24/15 1940 151/127 mmHg     Pulse Rate 03/24/15 1940 114     Resp 03/24/15 1940 22     Temp 03/24/15 1940 98.1 F (36.7 C)     Temp Source 03/24/15 1940 Oral     SpO2 03/24/15 1940 95 %     Weight 03/24/15 1940 273 lb (123.832 kg)     Height 03/24/15 1940 5\' 7"  (1.702 m)     Head Cir --      Peak Flow --      Pain Score 03/24/15  2029 5     Pain Loc --      Pain Edu? --      Excl. in [place]? --     Constitutional: Alert and oriented. Well appearing and in no acute distress. Eyes: Conjunctivae are normal. PERRL. EOMI. Head: Atraumatic. Nose: No congestion/rhinnorhea. Mouth/Throat: Mucous membranes are moist.  Oropharynx non-erythematous. Neck: No stridor.   Cardiovascular: Normal rate, regular rhythm. Grossly normal heart sounds.  Good peripheral circulation. Respiratory: Normal respiratory effort.  No retractions. Lungs CTAB. Respiratory rate 18 on my exam. Gastrointestinal: Soft and nontender. No distention. No abdominal bruits. No CVA tenderness. Musculoskeletal: No lower extremity tenderness nor edema.  No joint effusions. Neurologic:  Normal speech and language. No gross focal neurologic deficits are appreciated. No gait instability. Skin:  Skin is warm, dry and intact. No rash noted. Psychiatric: Mood and affect are normal. Speech and behavior are normal.  ____________________________________________   LABS (all labs ordered are listed, but only abnormal results are displayed)  Labs Reviewed   URINE CULTURE  CBC WITH DIFFERENTIAL/PLATELET  COMPREHENSIVE METABOLIC PANEL  URINALYSIS COMPLETEWITH MICROSCOPIC ([place])  URINE DRUG SCREEN, QUALITATIVE ([facility] ONLY)  ACETAMINOPHEN LEVEL  SALICYLATE LEVEL  ETHANOL   ____________________________________________  EKG   ____________________________________________  RADIOLOGY   ____________________________________________   PROCEDURES    ____________________________________________   INITIAL IMPRESSION / ASSESSMENT AND PLAN / ED COURSE  Pertinent labs & imaging results that were available during my care of the patient were reviewed by me and considered in my medical decision making (see chart for details).  [facility] consult psychiatry. We will keep off of involuntary treatment for now because patient is calm and without any suicidal or homicidal ideation. We will start Keflex secondary to UTI. ____________________________________________   FINAL CLINICAL IMPRESSION(S) / ED DIAGNOSES  Agitation. UTI.    Orbie Pyo, MD 03/24/15 2109  Patient resting comfortably without any agitation. Heart rate is now normal. Does appear to have an elevated white blood cell count which appears chronically elevated. Also with urinary tract infection. I called the patient's skilled nursing facility and discussed with the overnight supervisor,Surita.  She says the patient does not have any antibiotics ordered. I will order the patient Keflex to go home with. She'll be given her first dose here.  Orbie Pyo, MD 03/24/15 2346  Continues to be without any suicidal or homicidal ideation.  Orbie Pyo, MD 03/24/15 [phone]  Patient with Keflex from January 28 on his record here. Called back to [facility] [facility] and double checked with the nursing supervisor he says that she does in fact have Keflex ordered. Will not prescribe additional antibiotics at this time.  Orbie Pyo, MD 03/24/15  [phone]

## 2015-03-25 NOTE — BHH Counselor (Signed)
TTS spoke with [name] [name] from [facility]. TTS expressed concern for frequent visits to ED for aggression. Discussed alternate options for staff. Discussed contacting [name] [name] psychiatrist. [name] [name] stated that she did not believe that [name] [name] was appropriate for the facility. She also stated that [name] [name] is sent to the ED to have a psychiatric evaluation to gain permission to come back to the facility. TTS recommended that the staff and [name] [name]' collaterals meet with the [facility] facility social worker and [name] [name]' psychiatrist for a level of care assessment and review and find a placement that is able to meet the needs of [name] [name].  [name] [name] was also informed that [name] [name] has been to the ED numerous times and the discharge instructions had not been followed. The TTS discussed how it may be beneficial to be proactive to avoid future outbursts.

## 2015-03-27 ENCOUNTER — Telehealth: Payer: Self-pay | Admitting: Pharmacist

## 2015-03-27 LAB — URINE CULTURE

## 2015-03-27 NOTE — Telephone Encounter (Signed)
Called RN at [facility] and informed of urine cx results. Requested to fax over results for Dr. To review and order medication as he feels appropriate. Provided with the fax # [phone]. Culture report faxed over at 1400.   Ramond Dial, Pharm.D Clinical Pharmacist

## 2015-04-24 ENCOUNTER — Emergency Department: Payer: Medicare HMO

## 2015-04-24 ENCOUNTER — Emergency Department
Admission: EM | Admit: 2015-04-24 | Discharge: 2015-04-24 | Disposition: A | Discharge status: Home / Self Care | Payer: Medicare HMO | Attending: Emergency Medicine | Admitting: Emergency Medicine

## 2015-04-24 ENCOUNTER — Encounter: Payer: Self-pay | Admitting: Intensive Care

## 2015-04-24 DIAGNOSIS — Y9389 Activity, other specified: Secondary | ICD-10-CM | POA: Insufficient documentation

## 2015-04-24 DIAGNOSIS — I129 Hypertensive chronic kidney disease with stage 1 through stage 4 chronic kidney disease, or unspecified chronic kidney disease: Secondary | ICD-10-CM | POA: Diagnosis not present

## 2015-04-24 DIAGNOSIS — Y998 Other external cause status: Secondary | ICD-10-CM | POA: Insufficient documentation

## 2015-04-24 DIAGNOSIS — Y9289 Other specified places as the place of occurrence of the external cause: Secondary | ICD-10-CM | POA: Insufficient documentation

## 2015-04-24 DIAGNOSIS — Z79899 Other long term (current) drug therapy: Secondary | ICD-10-CM | POA: Diagnosis not present

## 2015-04-24 DIAGNOSIS — Z7984 Long term (current) use of oral hypoglycemic drugs: Secondary | ICD-10-CM | POA: Diagnosis not present

## 2015-04-24 DIAGNOSIS — N189 Chronic kidney disease, unspecified: Secondary | ICD-10-CM | POA: Insufficient documentation

## 2015-04-24 DIAGNOSIS — E119 Type 2 diabetes mellitus without complications: Secondary | ICD-10-CM | POA: Insufficient documentation

## 2015-04-24 DIAGNOSIS — S4992XA Unspecified injury of left shoulder and upper arm, initial encounter: Secondary | ICD-10-CM | POA: Insufficient documentation

## 2015-04-24 DIAGNOSIS — S299XXA Unspecified injury of thorax, initial encounter: Secondary | ICD-10-CM | POA: Insufficient documentation

## 2015-04-24 DIAGNOSIS — Z794 Long term (current) use of insulin: Secondary | ICD-10-CM | POA: Insufficient documentation

## 2015-04-24 DIAGNOSIS — T148 Other injury of unspecified body region: Secondary | ICD-10-CM | POA: Insufficient documentation

## 2015-04-24 DIAGNOSIS — S0990XA Unspecified injury of head, initial encounter: Secondary | ICD-10-CM | POA: Diagnosis present

## 2015-04-24 DIAGNOSIS — S79912A Unspecified injury of left hip, initial encounter: Secondary | ICD-10-CM | POA: Diagnosis not present

## 2015-04-24 DIAGNOSIS — Z7982 Long term (current) use of aspirin: Secondary | ICD-10-CM | POA: Diagnosis not present

## 2015-04-24 DIAGNOSIS — Z7951 Long term (current) use of inhaled steroids: Secondary | ICD-10-CM | POA: Insufficient documentation

## 2015-04-24 DIAGNOSIS — T148XXA Other injury of unspecified body region, initial encounter: Secondary | ICD-10-CM

## 2015-04-24 DIAGNOSIS — W07XXXA Fall from chair, initial encounter: Secondary | ICD-10-CM | POA: Diagnosis not present

## 2015-04-24 DIAGNOSIS — Z87891 Personal history of nicotine dependence: Secondary | ICD-10-CM | POA: Insufficient documentation

## 2015-04-24 DIAGNOSIS — W19XXXA Unspecified fall, initial encounter: Secondary | ICD-10-CM

## 2015-04-24 LAB — TROPONIN I: Troponin I: <0.03 ng/mL (ref <0.031)

## 2015-04-24 LAB — CBC WITH DIFFERENTIAL/PLATELET
Basophils Absolute: 0.1 10*3/uL (ref 0–0.1)
Basophils Relative: 1 %
EOS ABS: 0.2 10*3/uL (ref 0–0.7)
EOS PCT: 2 %
HCT: 40.4 % (ref 35.0–47.0)
HEMOGLOBIN: 13 g/dL (ref 12.0–16.0)
LYMPHS ABS: 2.7 10*3/uL (ref 1.0–3.6)
Lymphocytes Relative: 22 %
MCH: 27.3 pg (ref 26.0–34.0)
MCHC: 32.3 g/dL (ref 32.0–36.0)
MCV: 84.7 fL (ref 80.0–100.0)
MONOS PCT: 12 %
Monocytes Absolute: 1.5 10*3/uL — ABNORMAL HIGH (ref 0.2–0.9)
Neutro Abs: 7.7 10*3/uL — ABNORMAL HIGH (ref 1.4–6.5)
Neutrophils Relative %: 63 %
PLATELETS: 237 10*3/uL (ref 150–440)
RBC: 4.77 MIL/uL (ref 3.80–5.20)
RDW: 13.7 % (ref 11.5–14.5)
WBC: 12.2 10*3/uL — ABNORMAL HIGH (ref 3.6–11.0)

## 2015-04-24 LAB — COMPREHENSIVE METABOLIC PANEL
ALT: 23 U/L (ref 14–54)
AST: 24 U/L (ref 15–41)
Albumin: 3.7 g/dL (ref 3.5–5.0)
Alkaline Phosphatase: 76 U/L (ref 38–126)
Anion gap: 8 (ref 5–15)
BUN: 16 mg/dL (ref 6–20)
CO2: 32 mmol/L (ref 22–32)
Calcium: 9.1 mg/dL (ref 8.9–10.3)
Chloride: 99 mmol/L — ABNORMAL LOW (ref 101–111)
Creatinine, Ser: 1.05 mg/dL — ABNORMAL HIGH (ref 0.44–1.00)
GFR calc Af Amer: >60 mL/min (ref >60)
GFR calc non Af Amer: 53 mL/min — ABNORMAL LOW (ref >60)
Glucose, Bld: 219 mg/dL — ABNORMAL HIGH (ref 65–99)
Potassium: 4.1 mmol/L (ref 3.5–5.1)
Sodium: 139 mmol/L (ref 135–145)
Total Bilirubin: 0.4 mg/dL (ref 0.3–1.2)
Total Protein: 7.6 g/dL (ref 6.5–8.1)

## 2015-04-24 LAB — URINALYSIS COMPLETE WITH MICROSCOPIC (ARMC ONLY)
BILIRUBIN URINE: NEGATIVE
Glucose, UA: NEGATIVE mg/dL
Hgb urine dipstick: NEGATIVE
Ketones, ur: NEGATIVE mg/dL
Leukocytes, UA: NEGATIVE
Nitrite: NEGATIVE
PH: 6 (ref 5.0–8.0)
Protein, ur: NEGATIVE mg/dL
Specific Gravity, Urine: 1.006 (ref 1.005–1.030)

## 2015-04-24 LAB — GLUCOSE, CAPILLARY: GLUCOSE-CAPILLARY: 210 mg/dL — AB (ref 65–99)

## 2015-04-24 LAB — BRAIN NATRIURETIC PEPTIDE: B Natriuretic Peptide: 27 pg/mL (ref 0.0–100.0)

## 2015-04-24 MED ORDER — TRAMADOL HCL 50 MG PO TABS
50.0000 mg | ORAL_TABLET | Freq: Once | ORAL | Status: AC
  Administered 2015-04-24: 50 mg via ORAL
  Filled 2015-04-24: qty 1

## 2015-04-24 NOTE — ED Notes (Signed)
Patient arrived by EMS from [facility]. Patient reports "I was sitting in a chair eating my food and when I was finished I was trying to get my walker and the chair broke and I fell backwards. Patient reports hitting head and bottom. PAtient denies LOC.

## 2015-04-24 NOTE — Discharge Instructions (Signed)

## 2015-04-24 NOTE — ED Provider Notes (Signed)
[facility] Emergency Department Provider Note  ____________________________________________  Time seen: Approximately 5:29 PM  I have reviewed the triage vital signs and the nursing notes.   HISTORY  Chief Complaint Fall  Chief complaint is fall  HPI [name] [name] is a 70 y.o. female who was sitting in her chair at the table eating spaghetti when the leg of the chair broke and she fell out of the chair. Patient complains of being hit in the back of the head by the chair and has pain there and in the back of the left arm and some pain in the left hip as well. Patient did not lose consciousness. Patient has no bleeding. Patient has swelling in her legs which is been there for a while she says. Patient has no other complaints. Pain in the back of the head is moderate the pain in the back of the arm is moderately severe and the pain in the hip is mild. All of her murmur made worse with movement.   Past Medical History  Diagnosis Date  . DM2 (diabetes mellitus, type 2) ([place])   . Hypertension   . Hypercholesteremia   . Schizophrenia ([place])   . Osteoarthritis   . Bursitis   . OSA (obstructive sleep apnea)     does not use machine  . Left ventricular outflow tract obstruction     a. echo 03/2014: EF 60-65%, hypernamic LV systolic fxn, mod LVH w/ LVOT gradient estimated at 68 mm Hg w/ valsalva, very small LV internal cavity size, mildly increased LV posterior wall thickness, mild Ao valve scl w/o stenosis, diastolic dysfunction, normal RVSP  . LVH (left ventricular hypertrophy)     a. echo suggests long standing uncontrolled htn. she will not do well when dehydrated, LV cavity obliteration  . Obesity   . CHF (congestive heart failure) ([place])   . Anxiety   . COPD (chronic obstructive pulmonary disease) ([place])   . Bronchitis   . CAD (coronary artery disease)   . Tremors of nervous system   . GERD (gastroesophageal reflux disease)   . Environmental allergies   .  Arthritis   . Depression   . Chronic kidney disease     shadow on x-ray  . Muscle weakness   . Chronic cough   . Lower extremity edema   . Wheezing   . On supplemental oxygen therapy     AS NEEDED    Patient Active Problem List   Diagnosis Date Noted  . Schizoaffective disorder, bipolar type ([place])   . Schizoaffective disorder ([place]) 12/23/2014  . Schizoaffective disorder, bipolar type with good prognostic features ([place]) 10/30/2014  . Urinary incontinence 10/30/2014  . GERD (gastroesophageal reflux disease) 10/30/2014  . OSA (obstructive sleep apnea) 10/30/2014  . Osteoarthrosis, unspecified whether generalized or localized, involving lower leg 10/30/2014  . COPD (chronic obstructive pulmonary disease) ([place]) 10/30/2014  . Chronic diastolic CHF (congestive heart failure) ([place]) 05/15/2014  . Obesity   . History of colon polyps 03/09/2013  . Renal mass 06/26/2011  . Lytic bone lesion of hip 06/25/2011  . Hypertension 06/24/2011  . Diabetes mellitus ([place]) 06/24/2011    Past Surgical History  Procedure Laterality Date  . Tonsillectomy    . Knee reconstruction, medial patellar femoral ligament    . Cholecystectomy    . Colonoscopy  04/2011    [facility] per patient incomplete  . Tonsillectomy    . Cataract extraction w/phaco Left 09/10/2014    Procedure: CATARACT EXTRACTION PHACO AND  INTRAOCULAR LENS PLACEMENT (IOC);  Surgeon: Birder Robson, MD;  Location: [facility] ORS;  Service: Ophthalmology;  Laterality: Left;  [place]: 00:44 AP%: 22.8 CDE: 10.24 Fluid lot [id number]  . Joint replacement      TKR  . Eye surgery    . Cataract extraction w/phaco Right 10/15/2014    Procedure: CATARACT EXTRACTION PHACO AND INTRAOCULAR LENS PLACEMENT (IOC);  Surgeon: Birder Robson, MD;  Location: [facility] ORS;  Service: Ophthalmology;  Laterality: Right;  [place]: 00:52 AP:40.1 CDE:11.83 LOT [id number]    Current Outpatient Rx  Name  Route  Sig  Dispense  Refill  . albuterol (PROVENTIL HFA;VENTOLIN HFA)  108 (90 BASE) MCG/ACT inhaler   Inhalation   Inhale 2 puffs into the lungs 4 (four) times daily as needed for wheezing or shortness of breath.         Marland Kitchen aspirin EC 81 MG tablet   Oral   Take 81 mg by mouth daily.         Marland Kitchen atorvastatin (LIPITOR) 20 MG tablet   Oral   Take 20 mg by mouth at bedtime.         . benztropine (COGENTIN) 0.5 MG tablet   Oral   Take 1 tablet (0.5 mg total) by mouth 2 (two) times daily.   60 tablet   0   . betamethasone dipropionate (DIPROLENE) 0.05 % cream   Topical   Apply topically 2 (two) times daily.         . citalopram (CELEXA) 10 MG tablet   Oral   Take 10 mg by mouth at bedtime.         . dicyclomine (BENTYL) 20 MG tablet   Oral   Take 1 tablet (20 mg total) by mouth 3 (three) times daily as needed for spasms. Patient not taking: Reported on 12/24/2014   30 tablet   0   . dicyclomine (BENTYL) 20 MG tablet   Oral   Take 20 mg by mouth every 6 (six) hours as needed for spasms.         Marland Kitchen docusate sodium (COLACE) 100 MG capsule   Oral   Take 2 capsules (200 mg total) by mouth 2 (two) times daily.   120 capsule   0   . fluPHENAZine (PROLIXIN) 10 MG tablet   Oral   Take 15 mg by mouth at bedtime.          . fluPHENAZine (PROLIXIN) 5 MG tablet   Oral   Take 3 tablets (15 mg total) by mouth at bedtime. Patient not taking: Reported on 12/24/2014   90 tablet   0   . fluPHENAZine (PROLIXIN) 5 MG/ML solution   Oral   Take 25 mg by mouth every 14 (fourteen) days.         . fluPHENAZine decanoate (PROLIXIN) 25 MG/ML injection   Intramuscular   Inject 1 mL (25 mg total) into the muscle every 14 (fourteen) days. Patient not taking: Reported on 12/24/2014   5 mL   0     Due on 9/22   . Fluticasone-Salmeterol (ADVAIR) 250-50 MCG/DOSE AEPB   Inhalation   Inhale 1 puff into the lungs 2 (two) times daily.         . furosemide (LASIX) 40 MG tablet   Oral   Take 40 mg by mouth every Monday, Wednesday, and Friday.          Marland Kitchen glipiZIDE (GLUCOTROL) 10 MG tablet   Oral   Take 10 mg by  mouth 2 (two) times daily.         . insulin glargine (LANTUS) 100 UNIT/ML injection   Subcutaneous   Inject 0.15 mLs (15 Units total) into the skin at bedtime.   10 mL   2   . lisinopril (PRINIVIL,ZESTRIL) 20 MG tablet   Oral   Take 1 tablet (20 mg total) by mouth every morning.   30 tablet   0   . lisinopril (PRINIVIL,ZESTRIL) 5 MG tablet   Oral   Take 5 mg by mouth daily.         Marland Kitchen LORazepam (ATIVAN) 0.5 MG tablet   Oral   Take 0.5 mg by mouth 3 (three) times daily.         . metoCLOPramide (REGLAN) 10 MG tablet   Oral   Take 1 tablet (10 mg total) by mouth every 6 (six) hours as needed for nausea or vomiting. Patient not taking: Reported on 12/24/2014   12 tablet   1   . metoCLOPramide (REGLAN) 10 MG tablet   Oral   Take 10 mg by mouth every 6 (six) hours as needed for nausea.         . ranitidine (ZANTAC) 150 MG tablet   Oral   Take 150 mg by mouth 2 (two) times daily.         . sitaGLIPtin (JANUVIA) 100 MG tablet   Oral   Take 100 mg by mouth daily.         Marland Kitchen tiotropium (SPIRIVA) 18 MCG inhalation capsule   Inhalation   Place 1 capsule (18 mcg total) into inhaler and inhale daily.   30 capsule   12   . traMADol (ULTRAM) 50 MG tablet   Oral   Take 1 tablet (50 mg total) by mouth every 6 (six) hours as needed. Patient not taking: Reported on 12/23/2014   20 tablet   0   . traMADol (ULTRAM) 50 MG tablet   Oral   Take by mouth every 6 (six) hours as needed.         . verapamil (CALAN) 40 MG tablet   Oral   Take 40 mg by mouth every 6 (six) hours.           Allergies Haldol; Metformin; and Raspberry  Family History  Problem Relation Age of Onset  . Colon cancer Neg Hx   . Liver disease Neg Hx   . Heart attack Mother     Social History Social History  Substance Use Topics  . Smoking status: Former Research scientist (life sciences)  . Smokeless tobacco: Never Used  . Alcohol Use: No      Comment: occ.    Review of Systems Constitutional: No fever/chills Eyes: No visual changes. ENT: No sore throat. Cardiovascular: Denies chest pain. Respiratory: Denies shortness of breath. Gastrointestinal: No abdominal pain.  No nausea, no vomiting.  No diarrhea.  No constipation. Genitourinary: Negative for dysuria. Musculoskeletal: Negative for back pain. Skin: Negative for rash. Neurological: Negative for headaches, focal weakness or numbness.  10-point ROS otherwise negative.  ____________________________________________   PHYSICAL EXAM:  VITAL SIGNS: ED Triage Vitals  Enc Vitals Group     BP --      Pulse --      Resp --      Temp --      Temp src --      SpO2 --      Weight --      Height --  Head Cir --      Peak Flow --      Pain Score --      Pain Loc --      Pain Edu? --      Excl. in [place]? --     Constitutional: Alert and oriented. Well appearing and in no acute distress. Eyes: Conjunctivae are normal. PERRL. EOMI. Head: Atraumatic! Patient does complain of pain in the back of her head on palpation. Nose: No congestion/rhinnorhea. Mouth/Throat: Mucous membranes are moist.  Oropharynx non-erythematous. Neck: No stridor No cervical spine tenderness to palpation Cardiovascular: Normal rate, regular rhythm. Grossly normal heart sounds.  Good peripheral circulation. Respiratory: Normal respiratory effort.  No retractions. Lungs CTAB. Gastrointestinal: Soft and nontender. No distention. No abdominal bruits. No CVA tenderness. Musculoskeletal: Patient complains of mild pain in the left hip. Patient complains of a little bit of pain in the mid back. Patient complains of a lot of pain in the back of the left arm in the triceps area. Neurologic:  Normal speech and language. No gross focal neurologic deficits are appreciated. No gait instability. Skin:  Skin is warm, dry and intact. No rash noted. Psychiatric: Mood and affect are normal. Speech and  behavior are normal.  ____________________________________________   LABS (all labs ordered are listed, but only abnormal results are displayed)  Labs Reviewed  COMPREHENSIVE METABOLIC PANEL - Abnormal; Notable for the following:    Chloride 99 (*)    Glucose, Bld 219 (*)    Creatinine, Ser 1.05 (*)    GFR calc non Af Amer 53 (*)    All other components within normal limits  CBC WITH DIFFERENTIAL/PLATELET - Abnormal; Notable for the following:    WBC 12.2 (*)    Neutro Abs 7.7 (*)    Monocytes Absolute 1.5 (*)    All other components within normal limits  URINALYSIS COMPLETEWITH MICROSCOPIC ([facility] ONLY) - Abnormal; Notable for the following:    Color, Urine STRAW (*)    APPearance CLEAR (*)    Bacteria, UA RARE (*)    Squamous Epithelial / LPF 0-5 (*)    All other components within normal limits  BRAIN NATRIURETIC PEPTIDE  TROPONIN I   ____________________________________________  EKG   ____________________________________________  RADIOLOGY  X-rays of the chest arm and hip. By radiology as negative. CT of the head and neck read by radiology as negative. ____________________________________________   PROCEDURES    ____________________________________________   INITIAL IMPRESSION / ASSESSMENT AND PLAN / ED COURSE  Pertinent labs & imaging results that were available during my care of the patient were reviewed by me and considered in my medical decision making (see chart for details).   ____________________________________________   FINAL CLINICAL IMPRESSION(S) / ED DIAGNOSES  Final diagnoses:  Fall, initial encounter  Contusion      Nena Polio, MD 04/24/15 [phone]

## 2015-04-24 NOTE — ED Notes (Signed)
Patient transported to CT 

## 2015-04-24 NOTE — ED Notes (Signed)
Pts son contacted for pt pickup and transportation back to [facility].

## 2015-05-27 ENCOUNTER — Emergency Department: Payer: Medicare HMO

## 2015-05-27 ENCOUNTER — Emergency Department
Admission: EM | Admit: 2015-05-27 | Discharge: 2015-05-28 | Disposition: A | Discharge status: Home / Self Care | Payer: Medicare HMO | Attending: Emergency Medicine | Admitting: Emergency Medicine

## 2015-05-27 ENCOUNTER — Encounter: Payer: Self-pay | Admitting: Emergency Medicine

## 2015-05-27 DIAGNOSIS — Q248 Other specified congenital malformations of heart: Secondary | ICD-10-CM | POA: Diagnosis not present

## 2015-05-27 DIAGNOSIS — F329 Major depressive disorder, single episode, unspecified: Secondary | ICD-10-CM | POA: Insufficient documentation

## 2015-05-27 DIAGNOSIS — F209 Schizophrenia, unspecified: Secondary | ICD-10-CM | POA: Insufficient documentation

## 2015-05-27 DIAGNOSIS — E7800 Pure hypercholesterolemia, unspecified: Secondary | ICD-10-CM | POA: Diagnosis not present

## 2015-05-27 DIAGNOSIS — Z7952 Long term (current) use of systemic steroids: Secondary | ICD-10-CM | POA: Insufficient documentation

## 2015-05-27 DIAGNOSIS — R0602 Shortness of breath: Secondary | ICD-10-CM | POA: Diagnosis present

## 2015-05-27 DIAGNOSIS — I2510 Atherosclerotic heart disease of native coronary artery without angina pectoris: Secondary | ICD-10-CM | POA: Diagnosis not present

## 2015-05-27 DIAGNOSIS — N189 Chronic kidney disease, unspecified: Secondary | ICD-10-CM | POA: Insufficient documentation

## 2015-05-27 DIAGNOSIS — J441 Chronic obstructive pulmonary disease with (acute) exacerbation: Secondary | ICD-10-CM | POA: Insufficient documentation

## 2015-05-27 DIAGNOSIS — Z79899 Other long term (current) drug therapy: Secondary | ICD-10-CM | POA: Diagnosis not present

## 2015-05-27 DIAGNOSIS — M1990 Unspecified osteoarthritis, unspecified site: Secondary | ICD-10-CM | POA: Insufficient documentation

## 2015-05-27 DIAGNOSIS — E669 Obesity, unspecified: Secondary | ICD-10-CM | POA: Insufficient documentation

## 2015-05-27 DIAGNOSIS — Z9981 Dependence on supplemental oxygen: Secondary | ICD-10-CM | POA: Diagnosis not present

## 2015-05-27 DIAGNOSIS — I110 Hypertensive heart disease with heart failure: Secondary | ICD-10-CM | POA: Diagnosis not present

## 2015-05-27 DIAGNOSIS — Z7984 Long term (current) use of oral hypoglycemic drugs: Secondary | ICD-10-CM | POA: Insufficient documentation

## 2015-05-27 DIAGNOSIS — E119 Type 2 diabetes mellitus without complications: Secondary | ICD-10-CM | POA: Insufficient documentation

## 2015-05-27 DIAGNOSIS — Z794 Long term (current) use of insulin: Secondary | ICD-10-CM | POA: Insufficient documentation

## 2015-05-27 DIAGNOSIS — I509 Heart failure, unspecified: Secondary | ICD-10-CM | POA: Diagnosis not present

## 2015-05-27 DIAGNOSIS — I129 Hypertensive chronic kidney disease with stage 1 through stage 4 chronic kidney disease, or unspecified chronic kidney disease: Secondary | ICD-10-CM | POA: Diagnosis not present

## 2015-05-27 DIAGNOSIS — Z7982 Long term (current) use of aspirin: Secondary | ICD-10-CM | POA: Insufficient documentation

## 2015-05-27 LAB — BASIC METABOLIC PANEL
ANION GAP: 5 (ref 5–15)
BUN: 22 mg/dL — ABNORMAL HIGH (ref 6–20)
CHLORIDE: 101 mmol/L (ref 101–111)
CO2: 33 mmol/L — ABNORMAL HIGH (ref 22–32)
CREATININE: 1.19 mg/dL — AB (ref 0.44–1.00)
Calcium: 9 mg/dL (ref 8.9–10.3)
GFR calc Af Amer: 52 mL/min — ABNORMAL LOW (ref >60)
GFR calc non Af Amer: 45 mL/min — ABNORMAL LOW (ref >60)
Glucose, Bld: 185 mg/dL — ABNORMAL HIGH (ref 65–99)
POTASSIUM: 4.1 mmol/L (ref 3.5–5.1)
SODIUM: 139 mmol/L (ref 135–145)

## 2015-05-27 LAB — CBC WITH DIFFERENTIAL/PLATELET
BASOS ABS: 0.1 10*3/uL (ref 0–0.1)
Basophils Relative: 1 %
EOS ABS: 0.4 10*3/uL (ref 0–0.7)
Eosinophils Relative: 3 %
HCT: 40.3 % (ref 35.0–47.0)
HEMOGLOBIN: 13 g/dL (ref 12.0–16.0)
LYMPHS ABS: 3 10*3/uL (ref 1.0–3.6)
LYMPHS PCT: 24 %
MCH: 27.7 pg (ref 26.0–34.0)
MCHC: 32.2 g/dL (ref 32.0–36.0)
MCV: 86 fL (ref 80.0–100.0)
Monocytes Absolute: 1.3 10*3/uL — ABNORMAL HIGH (ref 0.2–0.9)
Monocytes Relative: 10 %
NEUTROS PCT: 62 %
Neutro Abs: 7.6 10*3/uL — ABNORMAL HIGH (ref 1.4–6.5)
Platelets: 236 10*3/uL (ref 150–440)
RBC: 4.69 MIL/uL (ref 3.80–5.20)
RDW: 13.8 % (ref 11.5–14.5)
WBC: 12.4 10*3/uL — AB (ref 3.6–11.0)

## 2015-05-27 LAB — TROPONIN I

## 2015-05-27 MED ORDER — ALBUTEROL SULFATE HFA 108 (90 BASE) MCG/ACT IN AERS: 2.0000 | INHALATION_SPRAY | Freq: Four times a day (QID) | RESPIRATORY_TRACT | Status: DC | PRN

## 2015-05-27 MED ORDER — AZITHROMYCIN 250 MG PO TABS: ORAL_TABLET | ORAL | Status: AC

## 2015-05-27 MED ORDER — ALBUTEROL SULFATE (2.5 MG/3ML) 0.083% IN NEBU
2.5000 mg | INHALATION_SOLUTION | Freq: Once | RESPIRATORY_TRACT | Status: AC
  Administered 2015-05-27: 2.5 mg via RESPIRATORY_TRACT
  Filled 2015-05-27: qty 3

## 2015-05-27 MED ORDER — IPRATROPIUM-ALBUTEROL 0.5-2.5 (3) MG/3ML IN SOLN
9.0000 mL | Freq: Once | RESPIRATORY_TRACT | Status: AC
  Administered 2015-05-27: 9 mL via RESPIRATORY_TRACT
  Filled 2015-05-27: qty 9

## 2015-05-27 MED ORDER — PREDNISONE 20 MG PO TABS: 40.0000 mg | ORAL_TABLET | Freq: Every day | ORAL | Status: DC

## 2015-05-27 MED ORDER — PREDNISONE 20 MG PO TABS
60.0000 mg | ORAL_TABLET | Freq: Once | ORAL | Status: AC
  Administered 2015-05-27: 60 mg via ORAL
  Filled 2015-05-27: qty 3

## 2015-05-27 MED ORDER — AZITHROMYCIN 500 MG PO TABS
500.0000 mg | ORAL_TABLET | Freq: Once | ORAL | Status: AC
  Administered 2015-05-28: 500 mg via ORAL
  Filled 2015-05-27: qty 1

## 2015-05-27 NOTE — ED Provider Notes (Signed)
[facility] Emergency Department Provider Note  ____________________________________________  Time seen: Approximately 750 PM  I have reviewed the triage vital signs and the nursing notes.   HISTORY  Chief Complaint Shortness of Breath   HPI [name] [name] is a 70 y.o. female with a history of schizoaffective disorder, COPD and CHF who is presenting to the emergency department today with difficulty breathing.She says that she had difficulty breathing when she was stepping out of the shower earlier tonight. She denies any pain. Denies specifically, any chest pain. Denies any pain at this time or difficulty breathing at this time. Says her symptoms have improved. Denies any cough or fever. Denies any body aches.   Past Medical History  Diagnosis Date  . DM2 (diabetes mellitus, type 2) ([place])   . Hypertension   . Hypercholesteremia   . Schizophrenia ([place])   . Osteoarthritis   . Bursitis   . OSA (obstructive sleep apnea)     does not use machine  . Left ventricular outflow tract obstruction     a. echo 03/2014: EF 60-65%, hypernamic LV systolic fxn, mod LVH w/ LVOT gradient estimated at 68 mm Hg w/ valsalva, very small LV internal cavity size, mildly increased LV posterior wall thickness, mild Ao valve scl w/o stenosis, diastolic dysfunction, normal RVSP  . LVH (left ventricular hypertrophy)     a. echo suggests long standing uncontrolled htn. she will not do well when dehydrated, LV cavity obliteration  . Obesity   . CHF (congestive heart failure) ([place])   . Anxiety   . COPD (chronic obstructive pulmonary disease) ([place])   . Bronchitis   . CAD (coronary artery disease)   . Tremors of nervous system   . GERD (gastroesophageal reflux disease)   . Environmental allergies   . Arthritis   . Depression   . Chronic kidney disease     shadow on x-ray  . Muscle weakness   . Chronic cough   . Lower extremity edema   . Wheezing   . On supplemental oxygen therapy      AS NEEDED    Patient Active Problem List   Diagnosis Date Noted  . Schizoaffective disorder, bipolar type ([place])   . Schizoaffective disorder ([place]) 12/23/2014  . Schizoaffective disorder, bipolar type with good prognostic features ([place]) 10/30/2014  . Urinary incontinence 10/30/2014  . GERD (gastroesophageal reflux disease) 10/30/2014  . OSA (obstructive sleep apnea) 10/30/2014  . Osteoarthrosis, unspecified whether generalized or localized, involving lower leg 10/30/2014  . COPD (chronic obstructive pulmonary disease) ([place]) 10/30/2014  . Chronic diastolic CHF (congestive heart failure) ([place]) 05/15/2014  . Obesity   . History of colon polyps 03/09/2013  . Renal mass 06/26/2011  . Lytic bone lesion of hip 06/25/2011  . Hypertension 06/24/2011  . Diabetes mellitus ([place]) 06/24/2011    Past Surgical History  Procedure Laterality Date  . Tonsillectomy    . Knee reconstruction, medial patellar femoral ligament    . Cholecystectomy    . Colonoscopy  04/2011    [facility] per patient incomplete  . Tonsillectomy    . Cataract extraction w/phaco Left 09/10/2014    Procedure: CATARACT EXTRACTION PHACO AND INTRAOCULAR LENS PLACEMENT (IOC);  Surgeon: Birder Robson, MD;  Location: [facility] ORS;  Service: Ophthalmology;  Laterality: Left;  [place]: 00:44 AP%: 22.8 CDE: 10.24 Fluid lot [id number] H  . Joint replacement      TKR  . Eye surgery    . Cataract extraction w/phaco Right 10/15/2014    Procedure:  CATARACT EXTRACTION PHACO AND INTRAOCULAR LENS PLACEMENT (IOC);  Surgeon: Birder Robson, MD;  Location: [facility] ORS;  Service: Ophthalmology;  Laterality: Right;  [place]: 00:52 AP:40.1 CDE:11.83 LOT [id number] H    Current Outpatient Rx  Name  Route  Sig  Dispense  Refill  . albuterol (PROVENTIL HFA;VENTOLIN HFA) 108 (90 BASE) MCG/ACT inhaler   Inhalation   Inhale 2 puffs into the lungs 4 (four) times daily as needed for wheezing or shortness of breath.         Marland Kitchen aspirin EC 81 MG tablet   Oral    Take 81 mg by mouth daily.         Marland Kitchen atorvastatin (LIPITOR) 20 MG tablet   Oral   Take 20 mg by mouth at bedtime.         . benztropine (COGENTIN) 0.5 MG tablet   Oral   Take 1 tablet (0.5 mg total) by mouth 2 (two) times daily.   60 tablet   0   . dicyclomine (BENTYL) 20 MG tablet   Oral   Take 1 tablet (20 mg total) by mouth 3 (three) times daily as needed for spasms.   30 tablet   0   . divalproex (DEPAKOTE) 500 MG DR tablet   Oral   Take 500 mg by mouth 2 (two) times daily.         Marland Kitchen docusate sodium (COLACE) 100 MG capsule   Oral   Take 2 capsules (200 mg total) by mouth 2 (two) times daily.   120 capsule   0   . fluPHENAZine (PROLIXIN) 5 MG tablet   Oral   Take 5-15 mg by mouth 2 (two) times daily. Pt takes one tablet in the morning and three at bedtime.         . fluPHENAZine decanoate (PROLIXIN) 25 MG/ML injection   Intramuscular   Inject 1 mL (25 mg total) into the muscle every 14 (fourteen) days.   5 mL   0     Due on 9/22   . Fluticasone-Salmeterol (ADVAIR) 250-50 MCG/DOSE AEPB   Inhalation   Inhale 1 puff into the lungs 2 (two) times daily.         . furosemide (LASIX) 40 MG tablet   Oral   Take 40 mg by mouth 2 (two) times daily.          Marland Kitchen glipiZIDE (GLUCOTROL) 10 MG tablet   Oral   Take 10 mg by mouth 2 (two) times daily.         . insulin glargine (LANTUS) 100 UNIT/ML injection   Subcutaneous   Inject 0.15 mLs (15 Units total) into the skin at bedtime.   10 mL   2   . lisinopril (PRINIVIL,ZESTRIL) 30 MG tablet   Oral   Take 30 mg by mouth daily.         Marland Kitchen LORazepam (ATIVAN) 0.5 MG tablet   Oral   Take 0.5 mg by mouth at bedtime. Pt is also able to take an additional tablet daily if needed for anxiety.         . meloxicam (MOBIC) 15 MG tablet   Oral   Take 15 mg by mouth daily.         . metoCLOPramide (REGLAN) 10 MG tablet   Oral   Take 1 tablet (10 mg total) by mouth every 6 (six) hours as needed for nausea  or vomiting.   12 tablet   1   . ranitidine (  ZANTAC) 150 MG tablet   Oral   Take 150 mg by mouth 2 (two) times daily.         . sitaGLIPtin (JANUVIA) 100 MG tablet   Oral   Take 100 mg by mouth daily.         Marland Kitchen tiotropium (SPIRIVA) 18 MCG inhalation capsule   Inhalation   Place 1 capsule (18 mcg total) into inhaler and inhale daily.   30 capsule   12   . traMADol (ULTRAM) 50 MG tablet   Oral   Take 50 mg by mouth every 6 (six) hours as needed for moderate pain.          . verapamil (CALAN) 40 MG tablet   Oral   Take 40 mg by mouth every 6 (six) hours.           Allergies Haldol; Metformin; and Raspberry  Family History  Problem Relation Age of Onset  . Colon cancer Neg Hx   . Liver disease Neg Hx   . Heart attack Mother     Social History Social History  Substance Use Topics  . Smoking status: Former Research scientist (life sciences)  . Smokeless tobacco: Never Used  . Alcohol Use: No     Comment: occ.    Review of Systems Constitutional: No fever/chills Eyes: No visual changes. ENT: No sore throat. Cardiovascular: Denies chest pain. Respiratory: As above Gastrointestinal: No abdominal pain.  No nausea, no vomiting.  No diarrhea.  No constipation. Genitourinary: Negative for dysuria. Musculoskeletal: Negative for back pain. Skin: Negative for rash. Neurological: Negative for headaches, focal weakness or numbness.  10-point ROS otherwise negative.  ____________________________________________   PHYSICAL EXAM:  VITAL SIGNS: ED Triage Vitals  Enc Vitals Group     BP 05/27/15 1832 127/100 mmHg     Pulse Rate 05/27/15 1832 88     Resp 05/27/15 1832 18     Temp 05/27/15 1832 98 F (36.7 C)     Temp Source 05/27/15 1832 Oral     SpO2 05/27/15 1832 99 %     Weight 05/27/15 1832 273 lb (123.832 kg)     Height 05/27/15 1832 5\' 6"  (1.676 m)     Head Cir --      Peak Flow --      Pain Score --      Pain Loc --      Pain Edu? --      Excl. in [place]? --      Constitutional: Alert and oriented. Well appearing and in no acute distress. Eyes: Conjunctivae are normal. PERRL. EOMI. Head: Atraumatic. Nose: No congestion/rhinnorhea. Mouth/Throat: Mucous membranes are moist.  Neck: No stridor.   Cardiovascular: Normal rate, regular rhythm. Grossly normal heart sounds.  Good peripheral circulation. Respiratory: Normal respiratory effort.  No retractions. Lungs CTAB. Gastrointestinal: Soft and nontender. No distention. No abdominal bruits. No CVA tenderness. Musculoskeletal: Bilateral and moderate lower extremity edema.  No joint effusions. Neurologic:  Normal speech and language. No gross focal neurologic deficits are appreciated. No gait instability. Skin:  Skin is warm, dry and intact. No rash noted. Psychiatric: Mood and affect are normal. Speech and behavior are normal.  ____________________________________________   LABS (all labs ordered are listed, but only abnormal results are displayed)  Labs Reviewed  CBC WITH DIFFERENTIAL/PLATELET - Abnormal; Notable for the following:    WBC 12.4 (*)    Neutro Abs 7.6 (*)    Monocytes Absolute 1.3 (*)    All other components within normal limits  BASIC METABOLIC PANEL - Abnormal; Notable for the following:    CO2 33 (*)    Glucose, Bld 185 (*)    BUN 22 (*)    Creatinine, Ser 1.19 (*)    GFR calc non Af Amer 45 (*)    GFR calc Af Amer 52 (*)    All other components within normal limits  TROPONIN I   ____________________________________________  EKG  ED ECG REPORT I, Doran Stabler, the attending physician, personally viewed and interpreted this ECG.   Date: 05/27/2015  EKG Time: 1930  Rate: 94  Rhythm: normal sinus rhythm  Axis: Normal axis  Intervals:none  ST&T Change: No ST segment elevation or depression. No abnormal T-wave inversion.  ____________________________________________  RADIOLOGY  Imaging Results       DG Chest 2 View (Final result) Result time:  05/27/15 19:20:27   Final result by Rad Results In Interface (05/27/15 19:20:27)   Narrative:   CLINICAL DATA: Dyspnea for over year, worsened over the past few days.  EXAM: CHEST 2 VIEW  COMPARISON: 04/24/2015  FINDINGS: There is unchanged mild left hemidiaphragm elevation. The lungs are clear. There is no pleural effusion. Pulmonary vasculature is normal. There is mild unchanged cardiomegaly and aortic tortuosity.  IMPRESSION: Unchanged mild cardiomegaly. No acute cardiopulmonary findings.   Electronically Signed By: Andreas Newport M.D. On: 05/27/2015 19:20    ____________________________________________   PROCEDURES  ____________________________________________   INITIAL IMPRESSION / ASSESSMENT AND PLAN / ED COURSE  Pertinent labs & imaging results that were available during my care of the patient were reviewed by me and considered in my medical decision making (see chart for details).  ----------------------------------------- 11:54 PM on 05/27/2015 -----------------------------------------  Patient without any wheezing or respiratory distress at this time. She is saying she also feels subjectively better.  Satting 93-94% on room air and breathing at 16 breaths per minute. Desatted down to the upper 80s but only when asleep. Likely with COPD exacerbation. We'll discharge home with prednisone as well as azithromycin. ____________________________________________   FINAL CLINICAL IMPRESSION(S) / ED DIAGNOSES  COPD exacerbation.    Orbie Pyo, MD 05/27/15 [phone]

## 2015-05-27 NOTE — ED Notes (Signed)
Pt arrived via EMS, a&o.  No resp distress. Pt reports feeling sob for the past "year and a half".

## 2015-05-28 NOTE — ED Notes (Signed)
Called facility, [facility], for transporting pt from ED to facility. Spoke with manager Mr. Whitten, was informed they did not have someone available to transport at this time.

## 2015-06-05 ENCOUNTER — Telehealth: Payer: Self-pay | Admitting: Cardiovascular Disease

## 2015-06-05 NOTE — Telephone Encounter (Signed)
Pt c/o medication issue:  1. Name of Medication: not sure   2. How are you currently taking this medication (dosage and times per day)?   3. Are you having a reaction (difficulty breathing--STAT)? Did not answer  4. What is your medication issue? All her medication   Pt called sounding very out of place, wanted to talk to Dr Rockey Situ about her medications, but was not sure which one she is on. Stated she would like to make an appt, so I made her an appointment on 06/11/15 to see Dr Rockey Situ.  Please advise  She also stated she would try and make that appointment but is not sure if she can. She was very confused, sounded like that as well.

## 2015-06-05 NOTE — Telephone Encounter (Signed)
Spoke w/ pt's son.  He reports that pt has quite a bit of anxiety this am but she is fine.  Asked him to call back if we can be of any assistance.

## 2015-06-07 ENCOUNTER — Emergency Department: Payer: Medicare HMO

## 2015-06-07 ENCOUNTER — Emergency Department
Admission: EM | Admit: 2015-06-07 | Discharge: 2015-06-08 | Disposition: A | Discharge status: Home / Self Care | Payer: Medicare HMO | Attending: Emergency Medicine | Admitting: Emergency Medicine

## 2015-06-07 DIAGNOSIS — F329 Major depressive disorder, single episode, unspecified: Secondary | ICD-10-CM | POA: Insufficient documentation

## 2015-06-07 DIAGNOSIS — E1122 Type 2 diabetes mellitus with diabetic chronic kidney disease: Secondary | ICD-10-CM | POA: Diagnosis not present

## 2015-06-07 DIAGNOSIS — Z79899 Other long term (current) drug therapy: Secondary | ICD-10-CM | POA: Diagnosis not present

## 2015-06-07 DIAGNOSIS — I130 Hypertensive heart and chronic kidney disease with heart failure and stage 1 through stage 4 chronic kidney disease, or unspecified chronic kidney disease: Secondary | ICD-10-CM | POA: Insufficient documentation

## 2015-06-07 DIAGNOSIS — J449 Chronic obstructive pulmonary disease, unspecified: Secondary | ICD-10-CM | POA: Diagnosis not present

## 2015-06-07 DIAGNOSIS — I5032 Chronic diastolic (congestive) heart failure: Secondary | ICD-10-CM | POA: Insufficient documentation

## 2015-06-07 DIAGNOSIS — Z7984 Long term (current) use of oral hypoglycemic drugs: Secondary | ICD-10-CM | POA: Insufficient documentation

## 2015-06-07 DIAGNOSIS — Z7982 Long term (current) use of aspirin: Secondary | ICD-10-CM | POA: Diagnosis not present

## 2015-06-07 DIAGNOSIS — N189 Chronic kidney disease, unspecified: Secondary | ICD-10-CM | POA: Diagnosis not present

## 2015-06-07 DIAGNOSIS — F2089 Other schizophrenia: Secondary | ICD-10-CM | POA: Diagnosis not present

## 2015-06-07 DIAGNOSIS — Z87891 Personal history of nicotine dependence: Secondary | ICD-10-CM | POA: Insufficient documentation

## 2015-06-07 DIAGNOSIS — R0602 Shortness of breath: Secondary | ICD-10-CM | POA: Diagnosis present

## 2015-06-07 DIAGNOSIS — R0600 Dyspnea, unspecified: Secondary | ICD-10-CM | POA: Diagnosis not present

## 2015-06-07 DIAGNOSIS — I2510 Atherosclerotic heart disease of native coronary artery without angina pectoris: Secondary | ICD-10-CM | POA: Diagnosis not present

## 2015-06-07 DIAGNOSIS — E669 Obesity, unspecified: Secondary | ICD-10-CM | POA: Insufficient documentation

## 2015-06-07 DIAGNOSIS — Z86010 Personal history of colonic polyps: Secondary | ICD-10-CM | POA: Diagnosis not present

## 2015-06-07 DIAGNOSIS — Z794 Long term (current) use of insulin: Secondary | ICD-10-CM | POA: Diagnosis not present

## 2015-06-07 DIAGNOSIS — F258 Other schizoaffective disorders: Secondary | ICD-10-CM

## 2015-06-07 LAB — BASIC METABOLIC PANEL
ANION GAP: 8 (ref 5–15)
BUN: 16 mg/dL (ref 6–20)
CO2: 31 mmol/L (ref 22–32)
Calcium: 9 mg/dL (ref 8.9–10.3)
Chloride: 96 mmol/L — ABNORMAL LOW (ref 101–111)
Creatinine, Ser: 0.89 mg/dL (ref 0.44–1.00)
GLUCOSE: 284 mg/dL — AB (ref 65–99)
POTASSIUM: 3.7 mmol/L (ref 3.5–5.1)
Sodium: 135 mmol/L (ref 135–145)

## 2015-06-07 LAB — CBC WITH DIFFERENTIAL/PLATELET
BASOS ABS: 0.1 10*3/uL (ref 0–0.1)
BASOS PCT: 1 %
EOS PCT: 1 %
Eosinophils Absolute: 0.1 10*3/uL (ref 0–0.7)
HCT: 42.9 % (ref 35.0–47.0)
Hemoglobin: 13.9 g/dL (ref 12.0–16.0)
LYMPHS PCT: 26 %
Lymphs Abs: 3.7 10*3/uL — ABNORMAL HIGH (ref 1.0–3.6)
MCH: 27.5 pg (ref 26.0–34.0)
MCHC: 32.3 g/dL (ref 32.0–36.0)
MCV: 85.1 fL (ref 80.0–100.0)
Monocytes Absolute: 2 10*3/uL — ABNORMAL HIGH (ref 0.2–0.9)
Monocytes Relative: 14 %
NEUTROS ABS: 8.6 10*3/uL — AB (ref 1.4–6.5)
Neutrophils Relative %: 58 %
PLATELETS: 241 10*3/uL (ref 150–440)
RBC: 5.04 MIL/uL (ref 3.80–5.20)
RDW: 14.1 % (ref 11.5–14.5)
WBC: 14.6 10*3/uL — AB (ref 3.6–11.0)

## 2015-06-07 LAB — VALPROIC ACID LEVEL

## 2015-06-07 LAB — TROPONIN I

## 2015-06-07 MED ORDER — ALBUTEROL SULFATE (2.5 MG/3ML) 0.083% IN NEBU
2.5000 mg | INHALATION_SOLUTION | Freq: Once | RESPIRATORY_TRACT | Status: AC
  Administered 2015-06-07: 2.5 mg via RESPIRATORY_TRACT
  Filled 2015-06-07: qty 3

## 2015-06-07 NOTE — ED Provider Notes (Signed)
-----------------------------------------   11:41 PM on 06/07/2015 -----------------------------------------  I went to evaluate the patient. She was resting comfortably. She easily awoken to verbal stimuli. She was in no distress. I discussed with the patient that no concerning findings on workup. I discussed with the patient that she showed certainly establish care with primary care doctor. Will give her primary care follow-up information.  Nance Pear, MD 06/07/15 [phone]

## 2015-06-07 NOTE — ED Notes (Signed)
Pt sleeping. 

## 2015-06-07 NOTE — ED Notes (Signed)
Pt c/o of SOB, toothache, and general malaise. Pt does not appear to be in any distress. Pt O2 sat 97% on RA

## 2015-06-07 NOTE — Discharge Instructions (Signed)
Please seek medical attention for any high fevers, chest pain, shortness of breath, change in behavior, persistent vomiting, bloody stool or any other new or concerning symptoms. ° °Weakness °Weakness is a lack of strength. You may feel weak all over your body or just in one part of your body. Weakness can be serious. In some cases, you may need more medical tests. °HOME CARE °· Rest. °· Eat a well-balanced diet. °· Try to exercise every day. °· Only take medicines as told by your doctor. °GET HELP RIGHT AWAY IF:  °· You cannot do your normal daily activities. °· You cannot walk up and down stairs, or you feel very tired when you do so. °· You have shortness of breath or chest pain. °· You have trouble moving parts of your body. °· You have weakness in only one body part or on only one side of the body. °· You have a fever. °· You have trouble speaking or swallowing. °· You cannot control when you pee (urinate) or poop (bowel movement). °· You have black or bloody throw up (vomit) or poop. °· Your weakness gets worse or spreads to other body parts. °· You have new aches or pains. °MAKE SURE YOU:  °· Understand these instructions. °· Will watch your condition. °· Will get help right away if you are not doing well or get worse. °  °This information is not intended to replace advice given to you by your health care provider. Make sure you discuss any questions you have with your health care provider. °  °Document Released: 01/22/2008 Document Revised: 08/10/2011 Document Reviewed: 04/09/2011 °Elsevier Interactive Patient Education ©2016 Elsevier Inc. ° ° °

## 2015-06-07 NOTE — ED Provider Notes (Signed)
[facility] Emergency Department Provider Note  ____________________________________________  Time seen: Seen upon arrival to the emergency department  I have reviewed the triage vital signs and the nursing notes.   HISTORY  Chief Complaint Shortness of Breath    HPI [name] [name] is a 70 y.o. female with a history of CHF and COPD with chronic shortness of breath was presenting today with shortness of breath. Per EMS, she approached the desk at her skilled nursing facility clinic shortness of breath. However, when EMS arrived she was not claiming any shortness of breath. They found her to these without any respiratory distress and with clear lungs. The patient is denying any pain. Denies any nausea vomiting or diarrhea. No diaphoresis. She says that she is very upset because her son told her that if she continued to come to the hospital that he would not see her. However, she denies any suicidal or homicidal ideation.   Past Medical History  Diagnosis Date  . DM2 (diabetes mellitus, type 2) ([place])   . Hypertension   . Hypercholesteremia   . Schizophrenia ([place])   . Osteoarthritis   . Bursitis   . OSA (obstructive sleep apnea)     does not use machine  . Left ventricular outflow tract obstruction     a. echo 03/2014: EF 60-65%, hypernamic LV systolic fxn, mod LVH w/ LVOT gradient estimated at 68 mm Hg w/ valsalva, very small LV internal cavity size, mildly increased LV posterior wall thickness, mild Ao valve scl w/o stenosis, diastolic dysfunction, normal RVSP  . LVH (left ventricular hypertrophy)     a. echo suggests long standing uncontrolled htn. she will not do well when dehydrated, LV cavity obliteration  . Obesity   . CHF (congestive heart failure) ([place])   . Anxiety   . COPD (chronic obstructive pulmonary disease) ([place])   . Bronchitis   . CAD (coronary artery disease)   . Tremors of nervous system   . GERD (gastroesophageal reflux disease)   .  Environmental allergies   . Arthritis   . Depression   . Chronic kidney disease     shadow on x-ray  . Muscle weakness   . Chronic cough   . Lower extremity edema   . Wheezing   . On supplemental oxygen therapy     AS NEEDED    Patient Active Problem List   Diagnosis Date Noted  . Schizoaffective disorder, bipolar type ([place])   . Schizoaffective disorder ([place]) 12/23/2014  . Schizoaffective disorder, bipolar type with good prognostic features ([place]) 10/30/2014  . Urinary incontinence 10/30/2014  . GERD (gastroesophageal reflux disease) 10/30/2014  . OSA (obstructive sleep apnea) 10/30/2014  . Osteoarthrosis, unspecified whether generalized or localized, involving lower leg 10/30/2014  . COPD (chronic obstructive pulmonary disease) ([place]) 10/30/2014  . Chronic diastolic CHF (congestive heart failure) ([place]) 05/15/2014  . Obesity   . History of colon polyps 03/09/2013  . Renal mass 06/26/2011  . Lytic bone lesion of hip 06/25/2011  . Hypertension 06/24/2011  . Diabetes mellitus ([place]) 06/24/2011    Past Surgical History  Procedure Laterality Date  . Tonsillectomy    . Knee reconstruction, medial patellar femoral ligament    . Cholecystectomy    . Colonoscopy  04/2011    [facility] per patient incomplete  . Tonsillectomy    . Cataract extraction w/phaco Left 09/10/2014    Procedure: CATARACT EXTRACTION PHACO AND INTRAOCULAR LENS PLACEMENT (IOC);  Surgeon: Birder Robson, MD;  Location: [facility] ORS;  Service: Ophthalmology;  Laterality: Left;  [place]: 00:44 AP%: 22.8 CDE: 10.24 Fluid lot [id number]  . Joint replacement      TKR  . Eye surgery    . Cataract extraction w/phaco Right 10/15/2014    Procedure: CATARACT EXTRACTION PHACO AND INTRAOCULAR LENS PLACEMENT (IOC);  Surgeon: Birder Robson, MD;  Location: [facility] ORS;  Service: Ophthalmology;  Laterality: Right;  [place]: 00:52 AP:40.1 CDE:11.83 LOT [id number]    Current Outpatient Rx  Name  Route  Sig  Dispense  Refill  . albuterol  (PROVENTIL HFA;VENTOLIN HFA) 108 (90 Base) MCG/ACT inhaler   Inhalation   Inhale 2 puffs into the lungs every 6 (six) hours as needed for wheezing or shortness of breath.   1 Inhaler   0   . aspirin EC 81 MG tablet   Oral   Take 81 mg by mouth daily.         Marland Kitchen atorvastatin (LIPITOR) 20 MG tablet   Oral   Take 20 mg by mouth at bedtime.         . benztropine (COGENTIN) 0.5 MG tablet   Oral   Take 1 tablet (0.5 mg total) by mouth 2 (two) times daily.   60 tablet   0   . dicyclomine (BENTYL) 20 MG tablet   Oral   Take 1 tablet (20 mg total) by mouth 3 (three) times daily as needed for spasms.   30 tablet   0   . divalproex (DEPAKOTE) 500 MG DR tablet   Oral   Take 500 mg by mouth 2 (two) times daily.         Marland Kitchen docusate sodium (COLACE) 100 MG capsule   Oral   Take 2 capsules (200 mg total) by mouth 2 (two) times daily.   120 capsule   0   . fluPHENAZine (PROLIXIN) 5 MG tablet   Oral   Take 5-15 mg by mouth 2 (two) times daily. Pt takes one tablet in the morning and three at bedtime.         . fluPHENAZine decanoate (PROLIXIN) 25 MG/ML injection   Intramuscular   Inject 1 mL (25 mg total) into the muscle every 14 (fourteen) days.   5 mL   0     Due on 9/22   . Fluticasone-Salmeterol (ADVAIR) 250-50 MCG/DOSE AEPB   Inhalation   Inhale 1 puff into the lungs 2 (two) times daily.         . furosemide (LASIX) 40 MG tablet   Oral   Take 40 mg by mouth 2 (two) times daily.          Marland Kitchen glipiZIDE (GLUCOTROL) 10 MG tablet   Oral   Take 10 mg by mouth 2 (two) times daily.         . insulin glargine (LANTUS) 100 UNIT/ML injection   Subcutaneous   Inject 0.15 mLs (15 Units total) into the skin at bedtime.   10 mL   2   . lisinopril (PRINIVIL,ZESTRIL) 30 MG tablet   Oral   Take 30 mg by mouth daily.         Marland Kitchen LORazepam (ATIVAN) 0.5 MG tablet   Oral   Take 0.5 mg by mouth at bedtime. Pt is also able to take an additional tablet daily if needed for  anxiety.         . meloxicam (MOBIC) 15 MG tablet   Oral   Take 15 mg by mouth daily.         Marland Kitchen  metoCLOPramide (REGLAN) 10 MG tablet   Oral   Take 1 tablet (10 mg total) by mouth every 6 (six) hours as needed for nausea or vomiting.   12 tablet   1   . predniSONE (DELTASONE) 20 MG tablet   Oral   Take 2 tablets (40 mg total) by mouth daily.   10 tablet   0   . ranitidine (ZANTAC) 150 MG tablet   Oral   Take 150 mg by mouth 2 (two) times daily.         . sitaGLIPtin (JANUVIA) 100 MG tablet   Oral   Take 100 mg by mouth daily.         Marland Kitchen tiotropium (SPIRIVA) 18 MCG inhalation capsule   Inhalation   Place 1 capsule (18 mcg total) into inhaler and inhale daily.   30 capsule   12   . traMADol (ULTRAM) 50 MG tablet   Oral   Take 50 mg by mouth every 6 (six) hours as needed for moderate pain.          . verapamil (CALAN) 40 MG tablet   Oral   Take 40 mg by mouth every 6 (six) hours.           Allergies Haldol; Metformin; and Raspberry  Family History  Problem Relation Age of Onset  . Colon cancer Neg Hx   . Liver disease Neg Hx   . Heart attack Mother     Social History Social History  Substance Use Topics  . Smoking status: Former Research scientist (life sciences)  . Smokeless tobacco: Never Used  . Alcohol Use: No     Comment: occ.    Review of Systems Constitutional: No fever/chills Eyes: No visual changes. ENT: No sore throat. Cardiovascular: Denies chest pain. Respiratory: As above Gastrointestinal: No abdominal pain.  No nausea, no vomiting.  No diarrhea.  No constipation. Genitourinary: Negative for dysuria. Musculoskeletal: Negative for back pain. Skin: Negative for rash. Neurological: Negative for headaches, focal weakness or numbness.  10-point ROS otherwise negative.  ____________________________________________   PHYSICAL EXAM:  VITAL SIGNS: ED Triage Vitals  Enc Vitals Group     BP 06/07/15 2058 168/136 mmHg     Pulse Rate 06/07/15 2058 102      Resp 06/07/15 2058 20     Temp 06/07/15 2058 98.8 F (37.1 C)     Temp Source 06/07/15 2058 Oral     SpO2 06/07/15 2056 97 %     Weight 06/07/15 2058 273 lb (123.832 kg)     Height 06/07/15 2058 5\' 7"  (1.702 m)     Head Cir --      Peak Flow --      Pain Score --      Pain Loc --      Pain Edu? --      Excl. in [place]? --     Constitutional: Alert and oriented. Well appearing and in no acute distress. Eyes: Conjunctivae are normal. PERRL. EOMI. Head: Atraumatic. Nose: No congestion/rhinnorhea. Mouth/Throat: Mucous membranes are moist.   Neck: No stridor.   Cardiovascular: Normal rate, regular rhythm. Grossly normal heart sounds.  Respiratory: Normal respiratory effort.  No retractions. Lungs CTAB. Gastrointestinal: Soft and nontender. No distention. No abdominal bruits. No CVA tenderness. Musculoskeletal: No lower extremity tenderness nor edema.  No joint effusions. Neurologic:  Normal speech and language. No gross focal neurologic deficits are appreciated. Skin:  Skin is warm, dry and intact. No rash noted. Psychiatric: Mood and affect are normal.  Speech and behavior are normal.  ____________________________________________   LABS (all labs ordered are listed, but only abnormal results are displayed)  Labs Reviewed  CBC WITH DIFFERENTIAL/PLATELET - Abnormal; Notable for the following:    WBC 14.6 (*)    Neutro Abs 8.6 (*)    Lymphs Abs 3.7 (*)    Monocytes Absolute 2.0 (*)    All other components within normal limits  BASIC METABOLIC PANEL - Abnormal; Notable for the following:    Chloride 96 (*)    Glucose, Bld 284 (*)    All other components within normal limits  VALPROIC ACID LEVEL - Abnormal; Notable for the following:    Valproic Acid Lvl <10 (*)    All other components within normal limits  TROPONIN I   ____________________________________________  EKG  ED ECG REPORT I, Doran Stabler, the attending physician, personally viewed and interpreted this  ECG.   Date: 06/07/2015  EKG Time: 2147  Rate: 103  Rhythm: sinus tachycardia  Axis: Normal axis  Intervals:none  ST&T Change: No ST segment elevation or depression. T-wave inversions in 1 as well as aVL. No significant changes from EKG done on 01/02/2015. ____________________________________________  RADIOLOGY      DG Chest 2 View (Final result) Result time: 06/07/15 21:49:59   Final result by Rad Results In Interface (06/07/15 21:49:59)   Narrative:   CLINICAL DATA: Dyspnea and left upper extremity pain, onset today.  EXAM: CHEST 2 VIEW  COMPARISON: 05/27/2015  FINDINGS: The heart size and mediastinal contours are within normal limits. Both lungs are clear. The visualized skeletal structures are unremarkable.  IMPRESSION: No active cardiopulmonary disease.   Electronically Signed By: Andreas Newport M.D. On: 06/07/2015 21:49    ____________________________________________   PROCEDURES  ____________________________________________   INITIAL IMPRESSION / ASSESSMENT AND PLAN / ED COURSE  Pertinent labs & imaging results that were available during my care of the patient were reviewed by me and considered in my medical decision making (see chart for details).  ----------------------------------------- 11:15 PM on 06/07/2015 ----------------------------------------- Patient signed out to Dr. Archie Balboa for reevaluation. However, I do believe that the patient is here with an exacerbation of her chronic symptoms.  ____________________________________________   FINAL CLINICAL IMPRESSION(S) / ED DIAGNOSES  Acute dyspnea, resolved.    Orbie Pyo, MD 06/07/15 [phone]

## 2015-06-08 NOTE — ED Notes (Signed)
Pt's son called to pick up mother. Pt's son reported he would be in shortly to pick pt up

## 2015-06-08 NOTE — ED Notes (Signed)
Reviewed d/c instructions, follow-up care with pt. Pt verbalized understanding 

## 2015-06-11 ENCOUNTER — Encounter: Payer: Self-pay | Admitting: Cardiovascular Disease

## 2015-06-11 ENCOUNTER — Ambulatory Visit (INDEPENDENT_AMBULATORY_CARE_PROVIDER_SITE_OTHER): Payer: Medicare HMO | Admitting: Cardiovascular Disease

## 2015-06-11 VITALS — BP 157/102 | HR 90 | Ht 66.0 in | Wt 280.0 lb

## 2015-06-11 DIAGNOSIS — I10 Essential (primary) hypertension: Secondary | ICD-10-CM | POA: Diagnosis not present

## 2015-06-11 DIAGNOSIS — E669 Obesity, unspecified: Secondary | ICD-10-CM | POA: Diagnosis not present

## 2015-06-11 DIAGNOSIS — G4730 Sleep apnea, unspecified: Secondary | ICD-10-CM | POA: Diagnosis not present

## 2015-06-11 DIAGNOSIS — I5032 Chronic diastolic (congestive) heart failure: Secondary | ICD-10-CM | POA: Diagnosis not present

## 2015-06-11 DIAGNOSIS — G4733 Obstructive sleep apnea (adult) (pediatric): Secondary | ICD-10-CM

## 2015-06-11 DIAGNOSIS — E1149 Type 2 diabetes mellitus with other diabetic neurological complication: Secondary | ICD-10-CM

## 2015-06-11 MED ORDER — LISINOPRIL 40 MG PO TABS: 40.0000 mg | ORAL_TABLET | Freq: Every day | ORAL | Status: DC

## 2015-06-11 MED ORDER — CARVEDILOL 3.125 MG PO TABS: 3.1250 mg | ORAL_TABLET | Freq: Two times a day (BID) | ORAL | Status: DC

## 2015-06-11 NOTE — Progress Notes (Signed)
Patient ID: [name] [name], female    DOB: 04/17/1945, 70 y.o.   MRN: [id number]  HPI Comments: [name] [name] is a 70 year old woman with obstructive sleep apnea, previously noncompliant on her CPAP or BiPAP, diastolic CHF, ejection fraction 60%, several hospital admissions for shortness of breath recent hospital discharge 04/29/2014 with diagnosis of acute hypercapnic respiratory failure and requiring BiPAP to her hospital admission, repeat admission 05/06/2014 for shortness of breath, somnolence. She was also overmedicated, sleepy on her hospital admission at the beginning of March after her son gave her her sleeping pill and she was confused and difficult to arouse. She presents to day for routine follow-up of her chronic diastolic CHF and hypercapnic respiratory failure  As on previous office visits, she is lethargic/somnolent on clinical exam today She is arousable, will wake up, conversant, then quickly back to sleep Staff from the nursing home reports that she is often sleepy in the daytime, has had significantly reduced activities, legs are getting weaker She does not sleep well Prior office notes indicating she was using BiPAP on a regular basis Current staff from [facility] report that she is not using her BiPAP Previously was on Ativan, dose is decreased for somnolence  She reports that she is very short of breath when she is lays back to sleep, keeps waking up Feels a tightness in her chest when she is sleeping Blood pressure is elevated on today's visit. No blood pressure recordings from the nursing home available  Other past medical history  Lab work from the hospital any 1.08, BUN 23 Previous blood gases in the hospital have shown elevated CO2 levels upwards of 70 or higher BNP 57 With overdiuresis in the hospital creatinine and BUN have climbed with creatinine 1.38, BUN 22 chest x-ray clear. Results were reviewed with her but she was very sleepy      Allergies   Allergen Reactions  . Haldol [Haloperidol Decanoate] Swelling and Other (See Comments)    Reaction:  Swelling of tongue and blurred vision    . Metformin Diarrhea  . Raspberry Swelling and Other (See Comments)    Reaction:  Swelling of lips       Medication List       This list is accurate as of: 06/11/15  1:43 PM.  Always use your most recent med list.               albuterol 108 (90 Base) MCG/ACT inhaler  Commonly known as:  PROVENTIL HFA;VENTOLIN HFA  Inhale 2 puffs into the lungs every 6 (six) hours as needed for wheezing or shortness of breath.     aspirin EC 81 MG tablet  Take 81 mg by mouth daily.     atorvastatin 20 MG tablet  Commonly known as:  LIPITOR  Take 20 mg by mouth at bedtime.     benztropine 0.5 MG tablet  Commonly known as:  COGENTIN  Take 1 tablet (0.5 mg total) by mouth 2 (two) times daily.        dicyclomine 20 MG tablet  Commonly known as:  BENTYL  Take 1 tablet (20 mg total) by mouth 3 (three) times daily as needed for spasms.     docusate sodium 100 MG capsule  Commonly known as:  COLACE  Take 2 capsules (200 mg total) by mouth 2 (two) times daily.     fluPHENAZine 5 MG tablet  Commonly known as:  PROLIXIN  Take 5-15 mg by mouth 2 (two) times daily. Pt  takes one tablet in the morning and three at bedtime.     fluPHENAZine decanoate 25 MG/ML injection  Commonly known as:  PROLIXIN  Inject 1 mL (25 mg total) into the muscle every 14 (fourteen) days.     Fluticasone-Salmeterol 250-50 MCG/DOSE Aepb  Commonly known as:  ADVAIR  Inhale 1 puff into the lungs 2 (two) times daily.     furosemide 40 MG tablet  Commonly known as:  LASIX  Take 40 mg by mouth 2 (two) times daily.     glipiZIDE 10 MG tablet  Commonly known as:  GLUCOTROL  Take 10 mg by mouth 2 (two) times daily.     insulin glargine 100 UNIT/ML injection  Commonly known as:  LANTUS  Inject 0.15 mLs (15 Units total) into the skin at bedtime.     lisinopril 30 MG tablet   Commonly known as:  PRINIVIL,ZESTRIL  Take 1 tablet (30 mg total) by mouth daily.     LORazepam 0.5 MG tablet  Commonly known as:  ATIVAN  Take 0.5 mg by mouth at bedtime. Pt is also able to take an additional tablet daily if needed for anxiety.     meloxicam 15 MG tablet  Commonly known as:  MOBIC  Take 15 mg by mouth daily.     metoCLOPramide 10 MG tablet  Commonly known as:  REGLAN  Take 1 tablet (10 mg total) by mouth every 6 (six) hours as needed for nausea or vomiting.     ranitidine 150 MG tablet  Commonly known as:  ZANTAC  Take 150 mg by mouth 2 (two) times daily.     sitaGLIPtin 100 MG tablet  Commonly known as:  JANUVIA  Take 100 mg by mouth daily.     tiotropium 18 MCG inhalation capsule  Commonly known as:  SPIRIVA  Place 1 capsule (18 mcg total) into inhaler and inhale daily.     traMADol 50 MG tablet  Commonly known as:  ULTRAM  Take 50 mg by mouth every 6 (six) hours as needed for moderate pain.     verapamil 40 MG tablet  Commonly known as:  CALAN  Take 40 mg by mouth every 6 (six) hours.        Past Medical History  Diagnosis Date  . DM2 (diabetes mellitus, type 2) ([place])   . Hypertension   . Hypercholesteremia   . Schizophrenia ([place])   . Osteoarthritis   . Bursitis   . OSA (obstructive sleep apnea)     does not use machine  . Left ventricular outflow tract obstruction     a. echo 03/2014: EF 60-65%, hypernamic LV systolic fxn, mod LVH w/ LVOT gradient estimated at 68 mm Hg w/ valsalva, very small LV internal cavity size, mildly increased LV posterior wall thickness, mild Ao valve scl w/o stenosis, diastolic dysfunction, normal RVSP  . LVH (left ventricular hypertrophy)     a. echo suggests long standing uncontrolled htn. she will not do well when dehydrated, LV cavity obliteration  . Obesity   . CHF (congestive heart failure) ([place])   . Anxiety   . COPD (chronic obstructive pulmonary disease) ([place])   . Bronchitis   . CAD (coronary artery  disease)   . Tremors of nervous system   . GERD (gastroesophageal reflux disease)   . Environmental allergies   . Arthritis   . Depression   . Chronic kidney disease     shadow on x-ray  . Muscle weakness   .  Chronic cough   . Lower extremity edema   . Wheezing   . On supplemental oxygen therapy     AS NEEDED    Past Surgical History  Procedure Laterality Date  . Tonsillectomy    . Knee reconstruction, medial patellar femoral ligament    . Cholecystectomy    . Colonoscopy  04/2011    [facility] per patient incomplete  . Tonsillectomy    . Cataract extraction w/phaco Left 09/10/2014    Procedure: CATARACT EXTRACTION PHACO AND INTRAOCULAR LENS PLACEMENT (IOC);  Surgeon: Birder Robson, MD;  Location: [facility] ORS;  Service: Ophthalmology;  Laterality: Left;  [place]: 00:44 AP%: 22.8 CDE: 10.24 Fluid lot [id number]  . Joint replacement      TKR  . Eye surgery    . Cataract extraction w/phaco Right 10/15/2014    Procedure: CATARACT EXTRACTION PHACO AND INTRAOCULAR LENS PLACEMENT (IOC);  Surgeon: Birder Robson, MD;  Location: [facility] ORS;  Service: Ophthalmology;  Laterality: Right;  [place]: 00:52 AP:40.1 CDE:11.83 LOT PACK [id number]  . Renal biopsy      Social History  reports that she has quit smoking. She has never used smokeless tobacco. She reports that she does not drink alcohol or use illicit drugs.  Family History family history includes Heart attack in her mother. There is no history of Colon cancer or Liver disease.   Review of Systems  HENT: Negative.   Eyes: Negative.   Respiratory: Positive for apnea.        Short of breath, chest tightness when supine sleeping in bed at nighttime. No symptoms in the day  Cardiovascular: Negative.   Gastrointestinal: Negative.   Musculoskeletal: Negative.   Skin: Negative.   Neurological: Negative.   Hematological: Negative.   Psychiatric/Behavioral: Positive for sleep disturbance.  All other systems reviewed and are negative.  BP  157/102 mmHg  Pulse 90  Ht 5\' 6"  (1.676 m)  Wt 280 lb (127.007 kg)  BMI 45.21 kg/m2  Physical Exam  Constitutional: She is oriented to person, place, and time. She appears well-developed and well-nourished.  HENT:  Head: Normocephalic.  Nose: Nose normal.  Mouth/Throat: Oropharynx is clear and moist.  Eyes: Conjunctivae are normal. Pupils are equal, round, and reactive to light.  Neck: Normal range of motion. Neck supple. No JVD present.  Cardiovascular: Normal rate, regular rhythm, S1 normal, S2 normal, normal heart sounds and intact distal pulses.  Exam reveals no gallop and no friction rub.   No murmur heard. Pulmonary/Chest: Effort normal and breath sounds normal. No respiratory distress. She has no wheezes. She has no rales. She exhibits no tenderness.  Abdominal: Soft. Bowel sounds are normal. She exhibits no distension. There is no tenderness.  Musculoskeletal: Normal range of motion. She exhibits no edema or tenderness.  Lymphadenopathy:    She has no cervical adenopathy.  Neurological: She is alert and oriented to person, place, and time. Coordination normal.  Skin: Skin is warm and dry. No rash noted. No erythema.  Psychiatric:  Lethargic, arousable  Assessment and Plan  Nursing note and vitals reviewed.

## 2015-06-11 NOTE — Assessment & Plan Note (Signed)
We have encouraged continued  careful diet management in an effort to lose weight.  Will defer diabetes management to primary care   Total encounter time more than 25 minutes  Greater than 50% was spent in counseling and coordination of care with the patient

## 2015-06-11 NOTE — Assessment & Plan Note (Signed)
Suggested that she stay on her Lasix 40 mg twice a day No significant edema of the left lower extremity

## 2015-06-11 NOTE — Patient Instructions (Signed)
You are doing well.  Please start coreg 3.125 mg po BID Please increase the lisinopril up to 40 mg daily  Please check blood pressure once per week  We will refer you to pulmonary clinic for sleep apnea evaluation  Please call us if you have new issues that need to be addressed before your next appt.  Your physician wants you to follow-up in: 6 months.  You will receive a reminder letter in the mail two months in advance. If you don't receive a letter, please call our office to schedule the follow-up appointment.

## 2015-06-11 NOTE — Assessment & Plan Note (Signed)
Weight likely contributing to underlying sleep apnea Lethargic most of the day, no regular exercise program

## 2015-06-11 NOTE — Assessment & Plan Note (Signed)
Notes indicating she was previously on BiPAP Prior hospital admissions for somnolence, possibly hypercapnia Lethargic on almost every visit Staff from the nursing home reports that she does not sleep well, likely secondary to sleep apnea We'll place referral to pulmonary. Perhaps she could be restarted on her BiPAP if tolerated

## 2015-06-11 NOTE — Assessment & Plan Note (Signed)
Recommended she start coreg 3.125 mg po BID, increase lisinopril up to 40 mg daily Suggested monitoring blood pressure once per week at the nursing home

## 2015-07-08 ENCOUNTER — Ambulatory Visit (INDEPENDENT_AMBULATORY_CARE_PROVIDER_SITE_OTHER): Payer: Medicare HMO | Admitting: Internal Medicine

## 2015-07-08 ENCOUNTER — Encounter: Payer: Self-pay | Admitting: Internal Medicine

## 2015-07-08 VITALS — BP 142/76 | HR 89 | Ht 66.0 in | Wt 283.0 lb

## 2015-07-08 DIAGNOSIS — G4733 Obstructive sleep apnea (adult) (pediatric): Secondary | ICD-10-CM

## 2015-07-08 DIAGNOSIS — J9612 Chronic respiratory failure with hypercapnia: Secondary | ICD-10-CM

## 2015-07-08 NOTE — Progress Notes (Signed)
[facility] Consultation      Assessment and Plan:  Obstructive sleep apnea.  --Untreated, does not want to use PAP.  --This is likely causing her fatigue and sleepiness. Will send for overnight oximetry and start oxygen if indicated. 70  Chronic hypercapnic respiratory failure. --Compensated, chronically elevated CO2, may be contributing to fatigue. Does not want to use PAP.  --Discussed that use of PAP may help her live longer and keep her out of the hospital, but she does not willing to try it.   Congestive heart failure, with left ventricular outflow tract obstruction.   Date: 07/08/2015  MRN# [id number] [name] [name] [birth year]  Referring Physician: Rockey Situ.   [name] [name] is a 70 y.o. old female seen in consultation for chief complaint of:    Chief Complaint  Patient presents with  . sleep consult    pt ref by dr. Rockey Situ. pt c/o daytime sleepiness, loud snoring & occ wakes up gasping for air. EPWORTH:28     HPI:   The patient is a 70 yo female with a history of OSA, and hypercapnic respiratory failure previously on bipap. She is a resident at an ECF where it has been noted that she is sleepy much of the time. She is followed by cardiology for diastolic CHF, LVH, LVOT obstruction, she has noted that she has dyspnea when laying flat.   She is a resident of [facility] assisted [facility], caregiver is present who notes that she is sleepy much of the day. The patient is insistent that she will not use PAP, but she thinks she would consider using oxygen. She frequently falls asleep today while in the room.   Review of Xray images 06/07/15; relatively normal lungs, reduced volumes from obesity, minimal change from previous films over the past year.  ABG 08/11/14; 7.4/73/49/45.2-- c/w chronic compensated hypercapnia.    PMHX:   Past Medical History  Diagnosis Date  . DM2 (diabetes mellitus, type 2) ([place])   . Hypertension   . Hypercholesteremia   . Schizophrenia  ([place])   . Osteoarthritis   . Bursitis   . OSA (obstructive sleep apnea)     does not use machine  . Left ventricular outflow tract obstruction     a. echo 03/2014: EF 60-65%, hypernamic LV systolic fxn, mod LVH w/ LVOT gradient estimated at 68 mm Hg w/ valsalva, very small LV internal cavity size, mildly increased LV posterior wall thickness, mild Ao valve scl w/o stenosis, diastolic dysfunction, normal RVSP  . LVH (left ventricular hypertrophy)     a. echo suggests long standing uncontrolled htn. she will not do well when dehydrated, LV cavity obliteration  . Obesity   . CHF (congestive heart failure) ([place])   . Anxiety   . COPD (chronic obstructive pulmonary disease) ([place])   . Bronchitis   . CAD (coronary artery disease)   . Tremors of nervous system   . GERD (gastroesophageal reflux disease)   . Environmental allergies   . Arthritis   . Depression   . Chronic kidney disease     shadow on x-ray  . Muscle weakness   . Chronic cough   . Lower extremity edema   . Wheezing   . On supplemental oxygen therapy     AS NEEDED   Surgical Hx:  Past Surgical History  Procedure Laterality Date  . Tonsillectomy    . Knee reconstruction, medial patellar femoral ligament    . Cholecystectomy    . Colonoscopy  04/2011  [facility] per patient incomplete  . Tonsillectomy    . Cataract extraction w/phaco Left 09/10/2014    Procedure: CATARACT EXTRACTION PHACO AND INTRAOCULAR LENS PLACEMENT (IOC);  Surgeon: Birder Robson, MD;  Location: [facility] ORS;  Service: Ophthalmology;  Laterality: Left;  [place]: 00:44 AP%: 22.8 CDE: 10.24 Fluid lot [id number]  . Joint replacement      TKR  . Eye surgery    . Cataract extraction w/phaco Right 10/15/2014    Procedure: CATARACT EXTRACTION PHACO AND INTRAOCULAR LENS PLACEMENT (IOC);  Surgeon: Birder Robson, MD;  Location: [facility] ORS;  Service: Ophthalmology;  Laterality: Right;  [place]: 00:52 AP:40.1 CDE:11.83 LOT PACK [id number]  . Renal biopsy     Family Hx:    Family History  Problem Relation Age of Onset  . Colon cancer Neg Hx   . Liver disease Neg Hx   . Heart attack Mother    Social Hx:   Social History  Substance Use Topics  . Smoking status: Former Research scientist (life sciences)  . Smokeless tobacco: Never Used  . Alcohol Use: No     Comment: occ.   Medication:   Current Outpatient Rx  Name  Route  Sig  Dispense  Refill  . albuterol (PROVENTIL HFA;VENTOLIN HFA) 108 (90 Base) MCG/ACT inhaler   Inhalation   Inhale 2 puffs into the lungs every 6 (six) hours as needed for wheezing or shortness of breath.   1 Inhaler   0   . aspirin EC 81 MG tablet   Oral   Take 81 mg by mouth daily.         Marland Kitchen atorvastatin (LIPITOR) 20 MG tablet   Oral   Take 20 mg by mouth at bedtime.         . benztropine (COGENTIN) 0.5 MG tablet   Oral   Take 1 tablet (0.5 mg total) by mouth 2 (two) times daily.   60 tablet   0   . carvedilol (COREG) 3.125 MG tablet   Oral   Take 1 tablet (3.125 mg total) by mouth 2 (two) times daily.   180 tablet   3   . dicyclomine (BENTYL) 20 MG tablet   Oral   Take 1 tablet (20 mg total) by mouth 3 (three) times daily as needed for spasms.   30 tablet   0   . docusate sodium (COLACE) 100 MG capsule   Oral   Take 2 capsules (200 mg total) by mouth 2 (two) times daily.   120 capsule   0   . fluPHENAZine (PROLIXIN) 5 MG tablet   Oral   Take 5-15 mg by mouth 2 (two) times daily. Pt takes one tablet in the morning and three at bedtime.         . fluPHENAZine decanoate (PROLIXIN) 25 MG/ML injection   Intramuscular   Inject 1 mL (25 mg total) into the muscle every 14 (fourteen) days.   5 mL   0     Due on 9/22   . Fluticasone-Salmeterol (ADVAIR) 250-50 MCG/DOSE AEPB   Inhalation   Inhale 1 puff into the lungs 2 (two) times daily.         . furosemide (LASIX) 40 MG tablet   Oral   Take 40 mg by mouth 2 (two) times daily.          Marland Kitchen glipiZIDE (GLUCOTROL) 10 MG tablet   Oral   Take 10 mg by mouth 2 (two) times  daily.         Marland Kitchen  insulin glargine (LANTUS) 100 UNIT/ML injection   Subcutaneous   Inject 0.15 mLs (15 Units total) into the skin at bedtime.   10 mL   2   . lisinopril (PRINIVIL,ZESTRIL) 40 MG tablet   Oral   Take 1 tablet (40 mg total) by mouth daily.   90 tablet   3   . LORazepam (ATIVAN) 0.5 MG tablet   Oral   Take 0.5 mg by mouth at bedtime. Pt is also able to take an additional tablet daily if needed for anxiety.         . meloxicam (MOBIC) 15 MG tablet   Oral   Take 15 mg by mouth daily.         . metoCLOPramide (REGLAN) 10 MG tablet   Oral   Take 1 tablet (10 mg total) by mouth every 6 (six) hours as needed for nausea or vomiting.   12 tablet   1   . ranitidine (ZANTAC) 150 MG tablet   Oral   Take 150 mg by mouth 2 (two) times daily.         . sitaGLIPtin (JANUVIA) 100 MG tablet   Oral   Take 100 mg by mouth daily.         Marland Kitchen tiotropium (SPIRIVA) 18 MCG inhalation capsule   Inhalation   Place 1 capsule (18 mcg total) into inhaler and inhale daily.   30 capsule   12   . traMADol (ULTRAM) 50 MG tablet   Oral   Take 50 mg by mouth every 6 (six) hours as needed for moderate pain.          . verapamil (CALAN) 40 MG tablet   Oral   Take 40 mg by mouth every 6 (six) hours.             Allergies:  Haldol; Metformin; and Raspberry  Review of Systems: Gen:  Denies  fever, sweats, chills HEENT: Denies blurred vision, double vision. bleeds, sore throat Cvc:  No dizziness, chest pain. Resp:   Denies cough or sputum production, shortness of breath Gi: Denies swallowing difficulty, stomach pain. Gu:  Denies bladder incontinence, burning urine Ext:   No Joint pain, stiffness. Skin: No skin rash,  hives  Endoc:  No polyuria, polydipsia. Psych: No depression, insomnia. Other:  All other systems were reviewed with the patient and were negative other that what is mentioned in the HPI.   Physical Examination:   VS: BP 142/76 mmHg  Pulse 89  Ht  5\' 6"  (1.676 m)  Wt 283 lb (128.368 kg)  BMI 45.70 kg/m2  SpO2 94%  General Appearance: No distress  Neuro:without focal findings,  speech normal,  HEENT: PERRLA, EOM intact.   Pulmonary: normal breath sounds, No wheezing.  CardiovascularNormal S1,S2.  No m/r/g.   Abdomen: Benign, Soft, non-tender. Renal:  No costovertebral tenderness  GU:  No performed at this time. Endoc: No evident thyromegaly, no signs of acromegaly. Skin:   warm, no rashes, no ecchymosis  Extremities: normal, no cyanosis, clubbing.  Other findings:    LABORATORY PANEL:   CBC No results for input(s): WBC, HGB, HCT, PLT in the last 168 hours. ------------------------------------------------------------------------------------------------------------------  Chemistries  No results for input(s): NA, K, CL, CO2, GLUCOSE, BUN, CREATININE, CALCIUM, MG, AST, ALT, ALKPHOS, BILITOT in the last 168 hours.  Invalid input(s): GFRCGP ------------------------------------------------------------------------------------------------------------------  Cardiac Enzymes No results for input(s): TROPONINI in the last 168 hours. ------------------------------------------------------------  RADIOLOGY:  No results found.     Thank  you for the consultation and for allowing [facility] to assist in the care of your patient. Our recommendations are noted above.  Please contact us if we can be of further service.   Marda Stalker, MD.  Board Certified in Internal Medicine, Pulmonary Medicine, [place], and Sleep Medicine.  [facility] Office Number: [phone]  Patricia Pesa, M.D.  Vilinda Boehringer, M.D.  Merton Border, M.D  07/08/2015

## 2015-07-08 NOTE — Patient Instructions (Signed)
Will send for Overnight oxymetry on room air. If low we will start oxygen.

## 2015-07-10 ENCOUNTER — Institutional Professional Consult (permissible substitution): Payer: Medicare HMO | Admitting: Internal Medicine

## 2015-07-11 ENCOUNTER — Encounter: Payer: Self-pay | Admitting: Internal Medicine

## 2015-07-21 ENCOUNTER — Emergency Department: Payer: Medicare HMO

## 2015-07-21 ENCOUNTER — Emergency Department
Admission: EM | Admit: 2015-07-21 | Discharge: 2015-07-21 | Disposition: A | Discharge status: Home / Self Care | Payer: Medicare HMO | Attending: Student | Admitting: Student

## 2015-07-21 ENCOUNTER — Encounter: Payer: Self-pay | Admitting: Emergency Medicine

## 2015-07-21 DIAGNOSIS — I2510 Atherosclerotic heart disease of native coronary artery without angina pectoris: Secondary | ICD-10-CM | POA: Insufficient documentation

## 2015-07-21 DIAGNOSIS — I5032 Chronic diastolic (congestive) heart failure: Secondary | ICD-10-CM | POA: Diagnosis not present

## 2015-07-21 DIAGNOSIS — M79604 Pain in right leg: Secondary | ICD-10-CM | POA: Diagnosis present

## 2015-07-21 DIAGNOSIS — J449 Chronic obstructive pulmonary disease, unspecified: Secondary | ICD-10-CM | POA: Diagnosis not present

## 2015-07-21 DIAGNOSIS — I130 Hypertensive heart and chronic kidney disease with heart failure and stage 1 through stage 4 chronic kidney disease, or unspecified chronic kidney disease: Secondary | ICD-10-CM | POA: Diagnosis not present

## 2015-07-21 DIAGNOSIS — Z7951 Long term (current) use of inhaled steroids: Secondary | ICD-10-CM | POA: Diagnosis not present

## 2015-07-21 DIAGNOSIS — Z794 Long term (current) use of insulin: Secondary | ICD-10-CM | POA: Diagnosis not present

## 2015-07-21 DIAGNOSIS — F209 Schizophrenia, unspecified: Secondary | ICD-10-CM | POA: Insufficient documentation

## 2015-07-21 DIAGNOSIS — Z79899 Other long term (current) drug therapy: Secondary | ICD-10-CM | POA: Diagnosis not present

## 2015-07-21 DIAGNOSIS — N189 Chronic kidney disease, unspecified: Secondary | ICD-10-CM | POA: Insufficient documentation

## 2015-07-21 DIAGNOSIS — F329 Major depressive disorder, single episode, unspecified: Secondary | ICD-10-CM | POA: Insufficient documentation

## 2015-07-21 DIAGNOSIS — Z87891 Personal history of nicotine dependence: Secondary | ICD-10-CM | POA: Insufficient documentation

## 2015-07-21 DIAGNOSIS — R2241 Localized swelling, mass and lump, right lower limb: Secondary | ICD-10-CM | POA: Insufficient documentation

## 2015-07-21 DIAGNOSIS — M1990 Unspecified osteoarthritis, unspecified site: Secondary | ICD-10-CM | POA: Diagnosis not present

## 2015-07-21 DIAGNOSIS — Z7982 Long term (current) use of aspirin: Secondary | ICD-10-CM | POA: Insufficient documentation

## 2015-07-21 DIAGNOSIS — M7989 Other specified soft tissue disorders: Secondary | ICD-10-CM

## 2015-07-21 LAB — CBC WITH DIFFERENTIAL/PLATELET
BAND NEUTROPHILS: 0 %
BASOS PCT: 1 %
Basophils Absolute: 0.1 10*3/uL (ref 0–0.1)
Blasts: 0 %
EOS ABS: 0.1 10*3/uL (ref 0–0.7)
EOS PCT: 1 %
HCT: 43.5 % (ref 35.0–47.0)
Hemoglobin: 13.9 g/dL (ref 12.0–16.0)
LYMPHS ABS: 3.9 10*3/uL — AB (ref 1.0–3.6)
Lymphocytes Relative: 35 %
MCH: 27.2 pg (ref 26.0–34.0)
MCHC: 32 g/dL (ref 32.0–36.0)
MCV: 85 fL (ref 80.0–100.0)
METAMYELOCYTES PCT: 0 %
MONO ABS: 1.4 10*3/uL — AB (ref 0.2–0.9)
MYELOCYTES: 0 %
Monocytes Relative: 13 %
NEUTROS PCT: 50 %
NRBC: 0 /100{WBCs}
Neutro Abs: 5.5 10*3/uL (ref 1.4–6.5)
Other: 0 %
PLATELETS: 260 10*3/uL (ref 150–440)
Promyelocytes Absolute: 0 %
RBC: 5.12 MIL/uL (ref 3.80–5.20)
RDW: 13.8 % (ref 11.5–14.5)
WBC: 11 10*3/uL (ref 3.6–11.0)

## 2015-07-21 LAB — COMPREHENSIVE METABOLIC PANEL
ALT: 22 U/L (ref 14–54)
ANION GAP: 8 (ref 5–15)
AST: 29 U/L (ref 15–41)
Albumin: 4 g/dL (ref 3.5–5.0)
Alkaline Phosphatase: 68 U/L (ref 38–126)
BUN: 15 mg/dL (ref 6–20)
CHLORIDE: 100 mmol/L — AB (ref 101–111)
CO2: 32 mmol/L (ref 22–32)
CREATININE: 0.92 mg/dL (ref 0.44–1.00)
Calcium: 9.2 mg/dL (ref 8.9–10.3)
GFR calc non Af Amer: >60 mL/min (ref >60)
Glucose, Bld: 114 mg/dL — ABNORMAL HIGH (ref 65–99)
Potassium: 4.1 mmol/L (ref 3.5–5.1)
SODIUM: 140 mmol/L (ref 135–145)
Total Bilirubin: 0.6 mg/dL (ref 0.3–1.2)
Total Protein: 7.9 g/dL (ref 6.5–8.1)

## 2015-07-21 LAB — TROPONIN I

## 2015-07-21 LAB — BRAIN NATRIURETIC PEPTIDE: B Natriuretic Peptide: 40 pg/mL (ref 0.0–100.0)

## 2015-07-21 MED ORDER — IBUPROFEN 400 MG PO TABS
400.0000 mg | ORAL_TABLET | Freq: Once | ORAL | Status: AC
  Administered 2015-07-21: 400 mg via ORAL
  Filled 2015-07-21: qty 1

## 2015-07-21 MED ORDER — ACETAMINOPHEN 500 MG PO TABS
1000.0000 mg | ORAL_TABLET | Freq: Once | ORAL | Status: AC
Start: 2015-07-21 — End: 2015-07-21
  Administered 2015-07-21: 1000 mg via ORAL
  Filled 2015-07-21: qty 2

## 2015-07-21 NOTE — ED Notes (Signed)
Son arrived and reports he can take pt home. Pt taken to car in wheelchair and son is driving pt home. Report called to [facility].

## 2015-07-21 NOTE — ED Notes (Signed)
This RN called [facility] in regards to transport. [place] reports there is no transportation available. EMS will be called. Pts son is out of town at this time.

## 2015-07-21 NOTE — Consult Note (Signed)
Paged at request of patient for prayer; she is upset about news regarding family.  Offered prayer, scripture and empathetic listening.
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
## 2015-08-04 ENCOUNTER — Telehealth: Payer: Self-pay | Admitting: *Deleted

## 2015-08-04 DIAGNOSIS — J449 Chronic obstructive pulmonary disease, unspecified: Secondary | ICD-10-CM

## 2015-08-04 NOTE — Telephone Encounter (Signed)
Order placed for O2 

## 2015-08-30 ENCOUNTER — Emergency Department: Payer: Medicare HMO

## 2015-08-30 ENCOUNTER — Encounter: Payer: Self-pay | Admitting: Emergency Medicine

## 2015-08-30 ENCOUNTER — Emergency Department
Admission: EM | Admit: 2015-08-30 | Discharge: 2015-09-02 | Disposition: A | Discharge status: Home / Self Care | Payer: Medicare HMO | Attending: Emergency Medicine | Admitting: Emergency Medicine

## 2015-08-30 DIAGNOSIS — R4689 Other symptoms and signs involving appearance and behavior: Secondary | ICD-10-CM

## 2015-08-30 DIAGNOSIS — N189 Chronic kidney disease, unspecified: Secondary | ICD-10-CM | POA: Insufficient documentation

## 2015-08-30 DIAGNOSIS — IMO0002 Reserved for concepts with insufficient information to code with codable children: Secondary | ICD-10-CM | POA: Diagnosis present

## 2015-08-30 DIAGNOSIS — I10 Essential (primary) hypertension: Secondary | ICD-10-CM | POA: Diagnosis present

## 2015-08-30 DIAGNOSIS — F918 Other conduct disorders: Secondary | ICD-10-CM | POA: Diagnosis not present

## 2015-08-30 DIAGNOSIS — M1990 Unspecified osteoarthritis, unspecified site: Secondary | ICD-10-CM | POA: Diagnosis not present

## 2015-08-30 DIAGNOSIS — M25512 Pain in left shoulder: Secondary | ICD-10-CM | POA: Insufficient documentation

## 2015-08-30 DIAGNOSIS — F329 Major depressive disorder, single episode, unspecified: Secondary | ICD-10-CM | POA: Diagnosis not present

## 2015-08-30 DIAGNOSIS — I2510 Atherosclerotic heart disease of native coronary artery without angina pectoris: Secondary | ICD-10-CM | POA: Insufficient documentation

## 2015-08-30 DIAGNOSIS — E1122 Type 2 diabetes mellitus with diabetic chronic kidney disease: Secondary | ICD-10-CM | POA: Insufficient documentation

## 2015-08-30 DIAGNOSIS — Z794 Long term (current) use of insulin: Secondary | ICD-10-CM | POA: Insufficient documentation

## 2015-08-30 DIAGNOSIS — Z79899 Other long term (current) drug therapy: Secondary | ICD-10-CM | POA: Insufficient documentation

## 2015-08-30 DIAGNOSIS — E119 Type 2 diabetes mellitus without complications: Secondary | ICD-10-CM

## 2015-08-30 DIAGNOSIS — F250 Schizoaffective disorder, bipolar type: Secondary | ICD-10-CM | POA: Insufficient documentation

## 2015-08-30 DIAGNOSIS — I130 Hypertensive heart and chronic kidney disease with heart failure and stage 1 through stage 4 chronic kidney disease, or unspecified chronic kidney disease: Secondary | ICD-10-CM | POA: Diagnosis not present

## 2015-08-30 DIAGNOSIS — I5032 Chronic diastolic (congestive) heart failure: Secondary | ICD-10-CM | POA: Insufficient documentation

## 2015-08-30 DIAGNOSIS — Z87891 Personal history of nicotine dependence: Secondary | ICD-10-CM | POA: Insufficient documentation

## 2015-08-30 DIAGNOSIS — Z7982 Long term (current) use of aspirin: Secondary | ICD-10-CM | POA: Insufficient documentation

## 2015-08-30 DIAGNOSIS — J449 Chronic obstructive pulmonary disease, unspecified: Secondary | ICD-10-CM | POA: Diagnosis not present

## 2015-08-30 DIAGNOSIS — M1710 Unilateral primary osteoarthritis, unspecified knee: Secondary | ICD-10-CM | POA: Diagnosis present

## 2015-08-30 DIAGNOSIS — Z046 Encounter for general psychiatric examination, requested by authority: Secondary | ICD-10-CM | POA: Diagnosis present

## 2015-08-30 LAB — SALICYLATE LEVEL

## 2015-08-30 LAB — URINE DRUG SCREEN, QUALITATIVE (ARMC ONLY)
Amphetamines, Ur Screen: NOT DETECTED
BENZODIAZEPINE, UR SCRN: NOT DETECTED
Barbiturates, Ur Screen: NOT DETECTED
CANNABINOID 50 NG, UR ~~LOC~~: NOT DETECTED
Cocaine Metabolite,Ur ~~LOC~~: NOT DETECTED
MDMA (ECSTASY) UR SCREEN: NOT DETECTED
Methadone Scn, Ur: NOT DETECTED
Opiate, Ur Screen: NOT DETECTED
Phencyclidine (PCP) Ur S: NOT DETECTED
TRICYCLIC, UR SCREEN: NOT DETECTED

## 2015-08-30 LAB — URINALYSIS COMPLETE WITH MICROSCOPIC (ARMC ONLY)
Bilirubin Urine: NEGATIVE
Glucose, UA: NEGATIVE mg/dL
Hgb urine dipstick: NEGATIVE
Ketones, ur: NEGATIVE mg/dL
LEUKOCYTES UA: NEGATIVE
Nitrite: NEGATIVE
PH: 6 (ref 5.0–8.0)
PROTEIN: NEGATIVE mg/dL
SPECIFIC GRAVITY, URINE: 1.008 (ref 1.005–1.030)

## 2015-08-30 LAB — CBC WITH DIFFERENTIAL/PLATELET
BASOS ABS: 0.1 10*3/uL (ref 0–0.1)
BASOS PCT: 1 %
EOS ABS: 0.2 10*3/uL (ref 0–0.7)
Eosinophils Relative: 2 %
HCT: 38.8 % (ref 35.0–47.0)
HEMOGLOBIN: 13.2 g/dL (ref 12.0–16.0)
LYMPHS ABS: 3.2 10*3/uL (ref 1.0–3.6)
Lymphocytes Relative: 26 %
MCH: 28.6 pg (ref 26.0–34.0)
MCHC: 33.9 g/dL (ref 32.0–36.0)
MCV: 84.4 fL (ref 80.0–100.0)
Monocytes Absolute: 1.5 10*3/uL — ABNORMAL HIGH (ref 0.2–0.9)
Monocytes Relative: 12 %
NEUTROS PCT: 61 %
Neutro Abs: 7.6 10*3/uL — ABNORMAL HIGH (ref 1.4–6.5)
Platelets: 229 10*3/uL (ref 150–440)
RBC: 4.6 MIL/uL (ref 3.80–5.20)
RDW: 14.2 % (ref 11.5–14.5)
WBC: 12.5 10*3/uL — AB (ref 3.6–11.0)

## 2015-08-30 LAB — ACETAMINOPHEN LEVEL

## 2015-08-30 LAB — COMPREHENSIVE METABOLIC PANEL
ALBUMIN: 3.7 g/dL (ref 3.5–5.0)
ALK PHOS: 71 U/L (ref 38–126)
ALT: 20 U/L (ref 14–54)
AST: 27 U/L (ref 15–41)
Anion gap: 7 (ref 5–15)
BUN: 15 mg/dL (ref 6–20)
CALCIUM: 8.7 mg/dL — AB (ref 8.9–10.3)
CO2: 33 mmol/L — AB (ref 22–32)
CREATININE: 1.04 mg/dL — AB (ref 0.44–1.00)
Chloride: 98 mmol/L — ABNORMAL LOW (ref 101–111)
GFR calc Af Amer: >60 mL/min (ref >60)
GFR calc non Af Amer: 53 mL/min — ABNORMAL LOW (ref >60)
GLUCOSE: 139 mg/dL — AB (ref 65–99)
Potassium: 3.6 mmol/L (ref 3.5–5.1)
SODIUM: 138 mmol/L (ref 135–145)
Total Bilirubin: 0.3 mg/dL (ref 0.3–1.2)
Total Protein: 7.4 g/dL (ref 6.5–8.1)

## 2015-08-30 LAB — ETHANOL: Alcohol, Ethyl (B): <5 mg/dL (ref <5)

## 2015-08-30 MED ORDER — ACETAMINOPHEN 325 MG PO TABS
650.0000 mg | ORAL_TABLET | Freq: Once | ORAL | Status: AC
Start: 2015-08-30 — End: 2015-08-30
  Administered 2015-08-30: 650 mg via ORAL

## 2015-08-30 MED ORDER — ACETAMINOPHEN 325 MG PO TABS
ORAL_TABLET | ORAL | Status: AC
  Administered 2015-08-30: 650 mg via ORAL
  Filled 2015-08-30: qty 2

## 2015-08-30 NOTE — ED Notes (Signed)
VORB from MD for 650mg  Tylenol PO.  This RN will enter order and administer.
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
## 2015-08-30 NOTE — ED Notes (Signed)

## 2015-08-30 NOTE — ED Notes (Signed)
XR at bedside

## 2015-08-30 NOTE — ED Notes (Signed)
Pt c/o left shoulder pain, asking MD for something to help her.
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
## 2015-08-31 DIAGNOSIS — F918 Other conduct disorders: Secondary | ICD-10-CM | POA: Diagnosis not present

## 2015-08-31 DIAGNOSIS — F250 Schizoaffective disorder, bipolar type: Secondary | ICD-10-CM

## 2015-08-31 LAB — GLUCOSE, CAPILLARY: Glucose-Capillary: 204 mg/dL — ABNORMAL HIGH (ref 65–99)

## 2015-08-31 LAB — VALPROIC ACID LEVEL: VALPROIC ACID LVL: 33 ug/mL — AB (ref 50.0–100.0)

## 2015-08-31 MED ORDER — ASPIRIN EC 81 MG PO TBEC
81.0000 mg | DELAYED_RELEASE_TABLET | Freq: Every day | ORAL | Status: DC
  Administered 2015-08-31 – 2015-09-02 (×3): 81 mg via ORAL
  Filled 2015-08-31 (×3): qty 1

## 2015-08-31 MED ORDER — POTASSIUM CHLORIDE CRYS ER 20 MEQ PO TBCR
20.0000 meq | EXTENDED_RELEASE_TABLET | Freq: Every day | ORAL | Status: DC
  Administered 2015-08-31 – 2015-09-02 (×3): 20 meq via ORAL
  Filled 2015-08-31 (×3): qty 1

## 2015-08-31 MED ORDER — FUROSEMIDE 40 MG PO TABS
40.0000 mg | ORAL_TABLET | Freq: Every day | ORAL | Status: DC
  Administered 2015-08-31 – 2015-09-02 (×3): 40 mg via ORAL
  Filled 2015-08-31 (×3): qty 1

## 2015-08-31 MED ORDER — BENZTROPINE MESYLATE 0.5 MG PO TABS
0.5000 mg | ORAL_TABLET | Freq: Two times a day (BID) | ORAL | Status: DC
  Administered 2015-08-31 – 2015-09-02 (×5): 0.5 mg via ORAL
  Filled 2015-08-31 (×5): qty 1

## 2015-08-31 MED ORDER — INSULIN GLARGINE 100 UNIT/ML ~~LOC~~ SOLN
15.0000 [IU] | Freq: Every day | SUBCUTANEOUS | Status: DC
  Administered 2015-08-31 – 2015-09-01 (×2): 15 [IU] via SUBCUTANEOUS
  Filled 2015-08-31 (×4): qty 0.15

## 2015-08-31 MED ORDER — LORAZEPAM 0.5 MG PO TABS
0.5000 mg | ORAL_TABLET | Freq: Every day | ORAL | Status: DC
  Administered 2015-08-31 – 2015-09-01 (×2): 0.5 mg via ORAL
  Filled 2015-08-31 (×2): qty 1

## 2015-08-31 MED ORDER — METOCLOPRAMIDE HCL 10 MG PO TABS: 10.0000 mg | ORAL_TABLET | Freq: Four times a day (QID) | ORAL | Status: DC | PRN

## 2015-08-31 MED ORDER — FLUPHENAZINE HCL 5 MG PO TABS
15.0000 mg | ORAL_TABLET | Freq: Every day | ORAL | Status: DC
  Administered 2015-08-31: 15 mg via ORAL
  Filled 2015-08-31 (×2): qty 1

## 2015-08-31 MED ORDER — FLUPHENAZINE HCL 5 MG PO TABS
5.0000 mg | ORAL_TABLET | Freq: Every day | ORAL | Status: DC
  Administered 2015-08-31 – 2015-09-02 (×3): 5 mg via ORAL
  Filled 2015-08-31 (×3): qty 1

## 2015-08-31 MED ORDER — DOCUSATE SODIUM 100 MG PO CAPS
200.0000 mg | ORAL_CAPSULE | Freq: Two times a day (BID) | ORAL | Status: DC
  Administered 2015-08-31 – 2015-09-02 (×5): 200 mg via ORAL
  Filled 2015-08-31 (×5): qty 2

## 2015-08-31 MED ORDER — DIVALPROEX SODIUM 500 MG PO DR TAB
500.0000 mg | DELAYED_RELEASE_TABLET | Freq: Two times a day (BID) | ORAL | Status: DC
  Administered 2015-08-31 – 2015-09-02 (×4): 500 mg via ORAL
  Filled 2015-08-31 (×4): qty 1

## 2015-08-31 MED ORDER — DICYCLOMINE HCL 20 MG PO TABS: 20.0000 mg | ORAL_TABLET | Freq: Three times a day (TID) | ORAL | Status: DC | PRN

## 2015-08-31 MED ORDER — LISINOPRIL 10 MG PO TABS
40.0000 mg | ORAL_TABLET | Freq: Every day | ORAL | Status: DC
  Administered 2015-08-31 – 2015-09-02 (×3): 40 mg via ORAL
  Filled 2015-08-31 (×3): qty 4

## 2015-08-31 MED ORDER — DIVALPROEX SODIUM 500 MG PO DR TAB
500.0000 mg | DELAYED_RELEASE_TABLET | Freq: Three times a day (TID) | ORAL | Status: DC
  Administered 2015-08-31: 500 mg via ORAL
  Filled 2015-08-31: qty 1

## 2015-08-31 MED ORDER — VERAPAMIL HCL 40 MG PO TABS
40.0000 mg | ORAL_TABLET | Freq: Four times a day (QID) | ORAL | Status: DC
  Administered 2015-08-31 – 2015-09-02 (×7): 40 mg via ORAL
  Filled 2015-08-31 (×11): qty 1

## 2015-08-31 MED ORDER — FLUPHENAZINE DECANOATE 25 MG/ML IJ SOLN
25.0000 mg | INTRAMUSCULAR | Status: DC
  Administered 2015-08-31: 25 mg via INTRAMUSCULAR
  Filled 2015-08-31: qty 1

## 2015-08-31 MED ORDER — DIVALPROEX SODIUM ER 500 MG PO TB24
500.0000 mg | ORAL_TABLET | Freq: Every day | ORAL | Status: DC
  Administered 2015-08-31: 500 mg via ORAL
  Filled 2015-08-31: qty 1

## 2015-08-31 MED ORDER — DIPHENHYDRAMINE HCL 25 MG PO CAPS
25.0000 mg | ORAL_CAPSULE | Freq: Once | ORAL | Status: AC
  Administered 2015-08-31: 25 mg via ORAL
  Filled 2015-08-31: qty 1

## 2015-08-31 MED ORDER — ATORVASTATIN CALCIUM 20 MG PO TABS
20.0000 mg | ORAL_TABLET | Freq: Every day | ORAL | Status: DC
  Administered 2015-08-31 – 2015-09-01 (×2): 20 mg via ORAL
  Filled 2015-08-31 (×2): qty 1

## 2015-08-31 MED ORDER — ALBUTEROL SULFATE HFA 108 (90 BASE) MCG/ACT IN AERS
2.0000 | INHALATION_SPRAY | Freq: Four times a day (QID) | RESPIRATORY_TRACT | Status: DC | PRN
Start: 2015-08-31 — End: 2015-09-02
  Administered 2015-08-31 – 2015-09-01 (×2): 2 via RESPIRATORY_TRACT
  Filled 2015-08-31 (×2): qty 6.7

## 2015-08-31 MED ORDER — TIOTROPIUM BROMIDE MONOHYDRATE 18 MCG IN CAPS
18.0000 ug | ORAL_CAPSULE | Freq: Every day | RESPIRATORY_TRACT | Status: DC
  Administered 2015-08-31 – 2015-09-02 (×3): 18 ug via RESPIRATORY_TRACT
  Filled 2015-08-31 (×2): qty 5

## 2015-08-31 MED ORDER — TRAMADOL HCL 50 MG PO TABS
50.0000 mg | ORAL_TABLET | Freq: Four times a day (QID) | ORAL | Status: DC | PRN
  Administered 2015-08-31 – 2015-09-02 (×5): 50 mg via ORAL
  Filled 2015-08-31 (×6): qty 1

## 2015-08-31 MED ORDER — GLIPIZIDE 10 MG PO TABS
10.0000 mg | ORAL_TABLET | Freq: Two times a day (BID) | ORAL | Status: DC
  Administered 2015-08-31 – 2015-09-02 (×5): 10 mg via ORAL
  Filled 2015-08-31 (×6): qty 1

## 2015-08-31 MED ORDER — FLUPHENAZINE HCL 5 MG PO TABS: 5.0000 mg | ORAL_TABLET | Freq: Two times a day (BID) | ORAL | Status: DC

## 2015-08-31 MED ORDER — CARVEDILOL 6.25 MG PO TABS
3.1250 mg | ORAL_TABLET | Freq: Two times a day (BID) | ORAL | Status: DC
  Administered 2015-08-31 – 2015-09-02 (×5): 3.125 mg via ORAL
  Filled 2015-08-31 (×5): qty 1

## 2015-08-31 MED ORDER — MELOXICAM 7.5 MG PO TABS
15.0000 mg | ORAL_TABLET | Freq: Every day | ORAL | Status: DC
  Administered 2015-08-31 – 2015-09-02 (×3): 15 mg via ORAL
  Filled 2015-08-31 (×3): qty 2
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
## 2015-08-31 NOTE — Progress Notes (Signed)
   08/31/15 1045  Clinical Encounter Type  Visited With Patient  Visit Type Follow-up;Spiritual support;Social support;ED  Referral From Patient;Nurse  Consult/Referral To Chaplain  Recommendations (Believe Social worker should work with her)  Spiritual Encounters  Spiritual Needs Literature;Prayer  Stress Factors  Patient Stress Factors Family relationships;Financial concerns;Lack of caregivers  Family Stress Factors None identified  Patient is unhappy where she is living.  Would recommend social work get involved and help patient.

## 2015-08-31 NOTE — ED Notes (Signed)
Chaplain at bedside. Pt asleep. Chaplain will come back after pt wakes up.

## 2015-08-31 NOTE — Progress Notes (Signed)
LCSW called [facility] Admission Coordinator [phone] and she did state the patient can return with a psychiatric consult. Patient is connected to Act Team when patient discharged please call worker [place] LCSW [phone]

## 2015-08-31 NOTE — ED Notes (Signed)
Pt requesting to see chaplain. Chaplain paged.

## 2015-08-31 NOTE — Consult Note (Signed)
[facility] Face-to-Face Psychiatry Consult   Reason for Consult:  Agitated behavior Referring Physician:  Dr. Cinda Quest Patient Identification: [name] [name] MRN:  [id number] Principal Diagnosis: Schizoaffective disorder, bipolar type [facility]) Diagnosis:   Patient Active Problem List   Diagnosis Date Noted  . Schizoaffective disorder, bipolar type ([place]) [F25.0]   . Urinary incontinence [R32] 10/30/2014  . GERD (gastroesophageal reflux disease) [K21.9] 10/30/2014  . OSA (obstructive sleep apnea) [G47.33] 10/30/2014  . Osteoarthrosis, unspecified whether generalized or localized, involving lower leg [M17.9] 10/30/2014  . COPD (chronic obstructive pulmonary disease) ([place]) [J44.9] 10/30/2014  . Chronic diastolic CHF (congestive heart failure) ([place]) [I50.32] 05/15/2014  . Obesity [E66.9]   . History of colon polyps [Z86.010] 03/09/2013  . Renal mass [N28.89] 06/26/2011  . 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 Hypertension [I10] 06/24/2011  . Diabetes mellitus ([place]) [E11.9] 06/24/2011    Total Time spent with patient: 1 hour  Subjective:    Identifying data. [name] [name] is a 70 y.o. female with a history of schizoaffective disorder.  Chief complaint. "They were lying on me."  History of present illness. Information was obtained from the patient and the chart. The patient has a long history of bipolar illness with multiple exacerbations and psychiatric hospitalizations including long-term. He now is a resident at family care home. She is in the Act team and stable on a combination of Prolixin and Depakote. Reportedly yesterday the patient called the ambulance and requested to be brought to the emergency room. She complained of shoulder pain that she felt was caused by a staff member for her are while helping her get out of the chair. When the ambulance arrived the patient got into a dispute than minor physical identification with the staff. This was unaware of 911 call. They were  instructed by her son who is the healthcare power of attorney inform him every time the patient is to the hospital. The staff that there needed to wait for the son. The patient insisted that she goes in an ambulance. Shoulder x-rays were unremarkable. We will get the patient will not be allowed to return to the facility but they are ready to take her back if her behavior improves. They are however going to give her 30 day notice. The patient believes that everybody in the facility is trying to harm her. She has been working them through [place] was not taking proper care of residence. I have known the patient for many years. She seems to be very close to her baseline. She is not depressed, anxious, or psychotic. She is not suicidal or homicidal. She has been compliant with medications. She is pleasant and polite and easily engaged. She is clever and has good sense of humor. She knows my full name even though I have not seen her in several years. There are no drugs or alcohol involved.  Past psychiatric history. The patient has severe case of schizoaffective disorder with multiple hospital admissions. There is history of medication noncompliance but she did much better since placed in a structured environment. She's been tried on multiple medications but Prolixin and Depakote seem to work for her. She denies ever attempting suicide.   Family psychiatric history. Nonreported.  Social history. She has worked as a Marine scientist. She is now disabled from mental illness. She is a resident of family care home. She has 1 son who is her healthcare power of attorney, very supportive and involved in her care.  Risk to Self: Suicidal Ideation: No  Suicidal Intent: No Is patient at risk for suicide?: No Suicidal Plan?: No Access to Means: No What has been your use of drugs/alcohol within the last 12 months?: Reports of none How many times?: 0 Other Self Harm Risks: Reports of none Triggers for Past Attempts: None  known Intentional Self Injurious Behavior: None Risk to Others: Homicidal Ideation: No Thoughts of Harm to Others: No Current Homicidal Intent: No Current Homicidal Plan: No Access to Homicidal Means: No Identified Victim: Reports of none History of harm to others?: Yes Assessment of Violence: In past 6-12 months Violent Behavior Description: Cause of current ER visit Does patient have access to weapons?: No Criminal Charges Pending?: No Does patient have a court date: No Prior Inpatient Therapy: Prior Inpatient Therapy: Yes Prior Therapy Dates: 10/2014, 11/2012, 05/2012 & 11/2004 Prior Therapy Facilty/Provider(s): [facility] Reason for Treatment: Schizoaffective Disorder Prior Outpatient Therapy: Prior Outpatient Therapy: Yes Prior Therapy Dates: Current Prior Therapy Facilty/Provider(s): [facility] Reason for Treatment: Schizoaffective Disorder Does patient have an ACCT team?: Yes ([facility]) Does patient have Intensive In-House Services?  : No Does patient have [facility] services? : No Does patient have P4CC services?: No  Past Medical History:  Past Medical History  Diagnosis Date  . DM2 (diabetes mellitus, type 2) ([place])   . Hypertension   . Hypercholesteremia   . Schizophrenia ([place])   . Osteoarthritis   . Bursitis   . OSA (obstructive sleep apnea)     does not use machine  . Left ventricular outflow tract obstruction     a. echo 03/2014: EF 60-65%, hypernamic LV systolic fxn, mod LVH w/ LVOT gradient estimated at 68 mm Hg w/ valsalva, very small LV internal cavity size, mildly increased LV posterior wall thickness, mild Ao valve scl w/o stenosis, diastolic dysfunction, normal RVSP  . LVH (left ventricular hypertrophy)     a. echo suggests long standing uncontrolled htn. she will not do well when dehydrated, LV cavity obliteration  . Obesity   . CHF (congestive heart failure) ([place])   . Anxiety   . COPD (chronic obstructive pulmonary disease) ([place])   . Bronchitis   . CAD  (coronary artery disease)   . Tremors of nervous system   . GERD (gastroesophageal reflux disease)   . Environmental allergies   . Arthritis   . Depression   . Chronic kidney disease     shadow on x-ray  . Muscle weakness   . Chronic cough   . Lower extremity edema   . Wheezing   . On supplemental oxygen therapy     AS NEEDED    Past Surgical History  Procedure Laterality Date  . Tonsillectomy    . Knee reconstruction, medial patellar femoral ligament    . Cholecystectomy    . Colonoscopy  04/2011    [facility] per patient incomplete  . Tonsillectomy    . Cataract extraction w/phaco Left 09/10/2014    Procedure: CATARACT EXTRACTION PHACO AND INTRAOCULAR LENS PLACEMENT (IOC);  Surgeon: Birder Robson, MD;  Location: [facility] ORS;  Service: Ophthalmology;  Laterality: Left;  [place]: 00:44 AP%: 22.8 CDE: 10.24 Fluid lot #[id number]  . Joint replacement      TKR  . Eye surgery    . Cataract extraction w/phaco Right 10/15/2014    Procedure: CATARACT EXTRACTION PHACO AND INTRAOCULAR LENS PLACEMENT (IOC);  Surgeon: Birder Robson, MD;  Location: [facility] ORS;  Service: Ophthalmology;  Laterality: Right;  [place]: 00:52 AP:40.1 CDE:11.83 LOT PACK #[id number]  . Renal biopsy  Family History:  Family History  Problem Relation Age of Onset  . Colon cancer Neg Hx   . Liver disease Neg Hx   . Heart attack Mother     Social History:  History  Alcohol Use No    Comment: occ.     History  Drug Use No    Social History   Social History  . Marital Status: Widowed    Spouse Name: N/A  . Number of Children: 2  . Years of Education: N/A   Social History Main Topics  . Smoking status: Former Smoker -- 3.00 packs/day for 0 years  . Smokeless tobacco: Never Used     Comment: quit in 2009  . Alcohol Use: No     Comment: occ.  . Drug Use: No  . Sexual Activity: Not Currently   Other Topics Concern  . None   Social History Narrative   Additional Social History:    Allergies:   Allergies   Allergen Reactions  . Haldol [Haloperidol Decanoate] Swelling and Other (See Comments)    Reaction:  Swelling of tongue and blurred vision    . Metformin Diarrhea  . Prednisone Other (See Comments)    Unknown reaction  . Raspberry Swelling and Other (See Comments)    Reaction:  Swelling of lips     Labs:  Results for orders placed or performed during the hospital encounter of 08/30/15 (from the past 48 hour(s))  Urinalysis complete, with microscopic ([facility] only)     Status: Abnormal   Collection Time: 08/30/15  8:30 PM  Result Value Ref Range   Color, Urine STRAW (A) YELLOW   APPearance CLEAR (A) CLEAR   Glucose, UA NEGATIVE NEGATIVE mg/dL   Bilirubin Urine NEGATIVE NEGATIVE   Ketones, ur NEGATIVE NEGATIVE mg/dL   Specific Gravity, Urine 1.008 1.005 - 1.030   Hgb urine dipstick NEGATIVE NEGATIVE   pH 6.0 5.0 - 8.0   Protein, ur NEGATIVE NEGATIVE mg/dL   Nitrite NEGATIVE NEGATIVE   Leukocytes, UA NEGATIVE NEGATIVE   RBC / HPF 0-5 0 - 5 RBC/hpf   WBC, UA 0-5 0 - 5 WBC/hpf   Bacteria, UA RARE (A) NONE SEEN   Squamous Epithelial / LPF 0-5 (A) NONE SEEN   Mucous PRESENT    Hyaline Casts, UA PRESENT   Urine Drug Screen, Qualitative ([facility] only)     Status: None   Collection Time: 08/30/15  8:30 PM  Result Value Ref Range   Tricyclic, Ur Screen NONE DETECTED NONE DETECTED   Amphetamines, Ur Screen NONE DETECTED NONE DETECTED   MDMA (Ecstasy)Ur Screen NONE DETECTED NONE DETECTED   Cocaine Metabolite,Ur [place] NONE DETECTED NONE DETECTED   Opiate, Ur Screen NONE DETECTED NONE DETECTED   Phencyclidine (PCP) Ur S NONE DETECTED NONE DETECTED   Cannabinoid 50 Ng, Ur [place] NONE DETECTED NONE DETECTED   Barbiturates, Ur Screen NONE DETECTED NONE DETECTED   Benzodiazepine, Ur Scrn NONE DETECTED NONE DETECTED   Methadone Scn, Ur NONE DETECTED NONE DETECTED    Comment: (NOTE) 601  Tricyclics, urine               Cutoff 1000 ng/mL 200  Amphetamines, urine             Cutoff 1000 ng/mL 300  MDMA  (Ecstasy), urine           Cutoff 500 ng/mL 400  Cocaine Metabolite, urine       Cutoff 300 ng/mL 500  Opiate, urine  Cutoff 300 ng/mL 600  Phencyclidine (PCP), urine      Cutoff 25 ng/mL 700  Cannabinoid, urine              Cutoff 50 ng/mL 800  Barbiturates, urine             Cutoff 200 ng/mL 900  Benzodiazepine, urine           Cutoff 200 ng/mL 1000 Methadone, urine                Cutoff 300 ng/mL 1100 1200 The urine drug screen provides only a preliminary, unconfirmed 1300 analytical test result and should not be used for non-medical 1400 purposes. Clinical consideration and professional judgment should 1500 be applied to any positive drug screen result due to possible 1600 interfering substances. A more specific alternate chemical method 1700 must be used in order to obtain a confirmed analytical result.  1800 Gas chromato graphy / mass spectrometry (GC/MS) is the preferred 1900 confirmatory method.   CBC with Differential     Status: Abnormal   Collection Time: 08/30/15  9:20 PM  Result Value Ref Range   WBC 12.5 (H) 3.6 - 11.0 K/uL   RBC 4.60 3.80 - 5.20 MIL/uL   Hemoglobin 13.2 12.0 - 16.0 g/dL   HCT 38.8 35.0 - 47.0 %   MCV 84.4 80.0 - 100.0 fL   MCH 28.6 26.0 - 34.0 pg   MCHC 33.9 32.0 - 36.0 g/dL   RDW 14.2 11.5 - 14.5 %   Platelets 229 150 - 440 K/uL   Neutrophils Relative % 61 %   Neutro Abs 7.6 (H) 1.4 - 6.5 K/uL   Lymphocytes Relative 26 %   Lymphs Abs 3.2 1.0 - 3.6 K/uL   Monocytes Relative 12 %   Monocytes Absolute 1.5 (H) 0.2 - 0.9 K/uL   Eosinophils Relative 2 %   Eosinophils Absolute 0.2 0 - 0.7 K/uL   Basophils Relative 1 %   Basophils Absolute 0.1 0 - 0.1 K/uL  Comprehensive metabolic panel     Status: Abnormal   Collection Time: 08/30/15  9:20 PM  Result Value Ref Range   Sodium 138 135 - 145 mmol/L   Potassium 3.6 3.5 - 5.1 mmol/L   Chloride 98 (L) 101 - 111 mmol/L   CO2 33 (H) 22 - 32 mmol/L   Glucose, Bld 139 (H) 65 - 99 mg/dL    BUN 15 6 - 20 mg/dL   Creatinine, Ser 1.04 (H) 0.44 - 1.00 mg/dL   Calcium 8.7 (L) 8.9 - 10.3 mg/dL   Total Protein 7.4 6.5 - 8.1 g/dL   Albumin 3.7 3.5 - 5.0 g/dL   AST 27 15 - 41 U/L   ALT 20 14 - 54 U/L   Alkaline Phosphatase 71 38 - 126 U/L   Total Bilirubin 0.3 0.3 - 1.2 mg/dL   GFR calc non Af Amer 53 (L) >60 mL/min   GFR calc Af Amer >60 >60 mL/min    Comment: (NOTE) The eGFR has been calculated using the CKD EPI equation. This calculation has not been validated in all clinical situations. eGFR's persistently <60 mL/min signify possible Chronic Kidney Disease.    Anion gap 7 5 - 15  Acetaminophen level     Status: Abnormal   Collection Time: 08/30/15  9:20 PM  Result Value Ref Range   Acetaminophen (Tylenol), Serum <10 (L) 10 - 30 ug/mL    Comment:        THERAPEUTIC  CONCENTRATIONS VARY SIGNIFICANTLY. A RANGE OF 10-30 ug/mL MAY BE AN EFFECTIVE CONCENTRATION FOR MANY PATIENTS. HOWEVER, SOME ARE BEST TREATED AT CONCENTRATIONS OUTSIDE THIS RANGE. ACETAMINOPHEN CONCENTRATIONS >150 ug/mL AT 4 HOURS AFTER INGESTION AND >50 ug/mL AT 12 HOURS AFTER INGESTION ARE OFTEN ASSOCIATED WITH TOXIC REACTIONS.   Salicylate level     Status: None   Collection Time: 08/30/15  9:20 PM  Result Value Ref Range   Salicylate Lvl <5.7 2.8 - 30.0 mg/dL  Ethanol     Status: None   Collection Time: 08/30/15  9:20 PM  Result Value Ref Range   Alcohol, Ethyl (B) <5 <5 mg/dL    Comment:        LOWEST DETECTABLE LIMIT FOR SERUM ALCOHOL IS 5 mg/dL FOR MEDICAL PURPOSES ONLY   Valproic acid level     Status: Abnormal   Collection Time: 08/30/15  9:20 PM  Result Value Ref Range   Valproic Acid Lvl 33 (L) 50.0 - 100.0 ug/mL    Current Facility-Administered Medications  Medication Dose Route Frequency Provider Last Rate Last Dose  . albuterol (PROVENTIL HFA;VENTOLIN HFA) 108 (90 Base) MCG/ACT inhaler 2 puff  2 puff Inhalation Q6H PRN Loney Hering, MD      . aspirin EC tablet 81 mg   81 mg Oral Daily Loney Hering, MD   81 mg at 08/31/15 0936  . atorvastatin (LIPITOR) tablet 20 mg  20 mg Oral QHS Loney Hering, MD      . benztropine (COGENTIN) tablet 0.5 mg  0.5 mg Oral BID Loney Hering, MD   0.5 mg at 08/31/15 0936  . carvedilol (COREG) tablet 3.125 mg  3.125 mg Oral BID Loney Hering, MD   3.125 mg at 08/31/15 0947  . dicyclomine (BENTYL) tablet 20 mg  20 mg Oral TID PRN Loney Hering, MD      . divalproex (DEPAKOTE) DR tablet 500 mg  500 mg Oral Q8H Kellan Boehlke B Arika Mainer, MD   500 mg at 08/31/15 1408  . docusate sodium (COLACE) capsule 200 mg  200 mg Oral BID Loney Hering, MD   200 mg at 08/31/15 0936  . fluPHENAZine (PROLIXIN) tablet 5 mg  5 mg Oral Daily Loney Hering, MD   5 mg at 08/31/15 3220   And  . fluPHENAZine (PROLIXIN) tablet 15 mg  15 mg Oral QHS Loney Hering, MD      . fluPHENAZine decanoate (PROLIXIN) injection 25 mg  25 mg Intramuscular Q14 Days Loney Hering, MD   25 mg at 08/31/15 0932  . furosemide (LASIX) tablet 40 mg  40 mg Oral Daily Loney Hering, MD   40 mg at 08/31/15 0947  . glipiZIDE (GLUCOTROL) tablet 10 mg  10 mg Oral BID Loney Hering, MD   10 mg at 08/31/15 0931  . insulin glargine (LANTUS) injection 15 Units  15 Units Subcutaneous QHS Loney Hering, MD      . lisinopril (PRINIVIL,ZESTRIL) tablet 40 mg  40 mg Oral Daily Loney Hering, MD   40 mg at 08/31/15 0940  . LORazepam (ATIVAN) tablet 0.5 mg  0.5 mg Oral QHS Loney Hering, MD      . meloxicam [facility]) tablet 15 mg  15 mg Oral Daily Loney Hering, MD   15 mg at 08/31/15 0946  . metoCLOPramide (REGLAN) tablet 10 mg  10 mg Oral Q6H PRN Loney Hering, MD      . potassium chloride  SA (K-DUR,KLOR-CON) CR tablet 20 mEq  20 mEq Oral Daily Loney Hering, MD   20 mEq at 08/31/15 0947  . tiotropium (SPIRIVA) inhalation capsule 18 mcg  18 mcg Inhalation Daily Loney Hering, MD   18 mcg at 08/31/15 0955  . traMADol (ULTRAM)  tablet 50 mg  50 mg Oral Q6H PRN Loney Hering, MD   50 mg at 08/31/15 1050  . verapamil (CALAN) tablet 40 mg  40 mg Oral Q6H Loney Hering, MD   40 mg at 08/31/15 1408   Current Outpatient Prescriptions  Medication Sig Dispense Refill  . albuterol (PROVENTIL HFA;VENTOLIN HFA) 108 (90 Base) MCG/ACT inhaler Inhale 2 puffs into the lungs every 6 (six) hours as needed for wheezing or shortness of breath. 1 Inhaler 0  . aspirin EC 81 MG tablet Take 81 mg by mouth daily.    Marland Kitchen atorvastatin (LIPITOR) 20 MG tablet Take 20 mg by mouth at bedtime.    . benztropine (COGENTIN) 0.5 MG tablet Take 1 tablet (0.5 mg total) by mouth 2 (two) times daily. 60 tablet 0  . carvedilol (COREG) 3.125 MG tablet Take 1 tablet (3.125 mg total) by mouth 2 (two) times daily. 180 tablet 3  . dicyclomine (BENTYL) 20 MG tablet Take 1 tablet (20 mg total) by mouth 3 (three) times daily as needed for spasms. 30 tablet 0  . divalproex (DEPAKOTE ER) 500 MG 24 hr tablet Take 500 mg by mouth daily.    Marland Kitchen docusate sodium (COLACE) 100 MG capsule Take 2 capsules (200 mg total) by mouth 2 (two) times daily. 120 capsule 0  . fluPHENAZine (PROLIXIN) 5 MG tablet Take 5-15 mg by mouth 2 (two) times daily. Pt takes one tablet in the morning and three at bedtime.    . fluPHENAZine decanoate (PROLIXIN) 25 MG/ML injection Inject 1 mL (25 mg total) into the muscle every 14 (fourteen) days. 5 mL 0  . Fluticasone-Salmeterol (ADVAIR) 250-50 MCG/DOSE AEPB Inhale 1 puff into the lungs 2 (two) times daily.    . furosemide (LASIX) 40 MG tablet Take 40 mg by mouth daily.     Marland Kitchen glipiZIDE (GLUCOTROL) 10 MG tablet Take 10 mg by mouth 2 (two) times daily.    . insulin glargine (LANTUS) 100 UNIT/ML injection Inject 0.15 mLs (15 Units total) into the skin at bedtime. 10 mL 2  . lisinopril (PRINIVIL,ZESTRIL) 40 MG tablet Take 1 tablet (40 mg total) by mouth daily. 90 tablet 3  . LORazepam (ATIVAN) 0.5 MG tablet Take 0.5 mg by mouth at bedtime. Pt is also  able to take an additional tablet daily if needed for anxiety.    . meloxicam (MOBIC) 15 MG tablet Take 15 mg by mouth daily.    . metoCLOPramide (REGLAN) 10 MG tablet Take 1 tablet (10 mg total) by mouth every 6 (six) hours as needed for nausea or vomiting. 12 tablet 1  . potassium chloride SA (K-DUR,KLOR-CON) 20 MEQ tablet Take 20 mEq by mouth daily.    . ranitidine (ZANTAC) 150 MG tablet Take 150 mg by mouth 2 (two) times daily.    . sitaGLIPtin (JANUVIA) 100 MG tablet Take 100 mg by mouth daily.    Marland Kitchen tiotropium (SPIRIVA) 18 MCG inhalation capsule Place 1 capsule (18 mcg total) into inhaler and inhale daily. 30 capsule 12  . traMADol (ULTRAM) 50 MG tablet Take 50 mg by mouth every 6 (six) hours as needed for moderate pain.     . verapamil (CALAN) 40  MG tablet Take 40 mg by mouth every 6 (six) hours.      Musculoskeletal: Strength & Muscle Tone: within normal limits Gait & Station: normal Patient leans: N/A  Psychiatric Specialty Exam: Physical Exam  Nursing note and vitals reviewed.   Review of Systems  Musculoskeletal: Positive for joint pain.  All other systems reviewed and are negative.   Blood pressure 160/102, pulse 76, temperature 97.9 F (36.6 C), temperature source Oral, resp. rate 14, SpO2 93 %.There is no weight on file to calculate BMI.  General Appearance: Casual  Eye Contact:  Good  Speech:  Clear and Coherent  Volume:  Normal  Mood:  Euphoric  Affect:  Appropriate  Thought Process:  Goal Directed  Orientation:  Full (Time, Place, and Person)  Thought Content:  WDL  Suicidal Thoughts:  No  Homicidal Thoughts:  No  Memory:  Immediate;   Fair Recent;   Fair Remote;   Fair  Judgement:  Impaired  Insight:  Shallow  Psychomotor Activity:  Normal  Concentration:  Concentration: Fair and Attention Span: Fair  Recall:  [employer] of Knowledge:  Fair  Language:  Fair  Akathisia:  No  Handed:  Right  AIMS (if indicated):     Assets:  Communication  Skills Desire for Improvement Financial Resources/Insurance Housing Resilience Social Support  ADL's:  Intact  Cognition:  WNL  Sleep:        Treatment Plan Summary: Daily contact with patient to assess and evaluate symptoms and progress in treatment and Medication management   Ms.Surber is a 70 year old female with history of schizoaffective order brought from her family care home for oppositional behavior.   1. Agitation. This has resolved. The patient is cool and collected.  2. Mood and psychosis. She has been maintained on a combination of Depakote and Prolixin oral and injections for psychosis and mood stabilization. We will continue. She was given Prolixin Decanoate injection today in the emergency room. Her Depakote was slightly subtherapeutic on admission. It is possible that she did not receive her Depakote dose last night. I will briefly increase the Depakote dose while in the emergency room.   3. Hypertension. She is on verapamil lisinopril. Furosemide and Coreg.  4. COPD she is on inhalers.  5. Dyslipidemia. She is on Lipitor and aspirin.  6. Diabetes. She takes Lantus and glipizide.  7. Disposition. She will be discharged back to her facility as quickly as possible, hopefully tomorrow.  Disposition: No evidence of imminent risk to self or others at present.   Patient does not meet criteria for psychiatric inpatient admission. Supportive therapy provided about ongoing stressors. Discussed crisis plan, support from social network, calling 911, coming to the Emergency Department, and calling Suicide Hotline.  Orson Slick, MD 08/31/2015 4:00 PM

## 2015-08-31 NOTE — ED Notes (Signed)
Psych MD at bedside

## 2015-08-31 NOTE — Consult Note (Signed)
  [name] [name] has a history of schizoaffective disorder stable on her medications. She came to the ER due to conflict in the group home. She may return there in 72 hours if her behavior improves. I have known [name] [name] for years and believe that she is at her baseline. She is not depressed, anxious or psychotic. She is not suicidal or homicidal.  PLAN: 1. We will continue her current regimen.  2. SW will attempt to send her back to her facility tomorrow.   3. I will follow up.   4. Full note to follow.

## 2015-08-31 NOTE — ED Notes (Signed)
Pt tearful, reports she has been through a lot at the nursing home, they did not treat her right and she doesn't not want to go back.

## 2015-08-31 NOTE — ED Notes (Addendum)
Pt incontinent of urine and stool. Pt assisted to bathroom, cleaned. Brief applied.

## 2015-08-31 NOTE — ED Notes (Signed)
Chaplain at bedside

## 2015-08-31 NOTE — ED Notes (Signed)
Allowed pt to use phone to call friend.

## 2015-08-31 NOTE — ED Notes (Signed)

## 2015-08-31 NOTE — ED Notes (Addendum)

## 2015-08-31 NOTE — ED Notes (Signed)
Pt c/o left arm pain. Will administer prn medication for pain.

## 2015-08-31 NOTE — ED Notes (Signed)
Pt. Stated "I would like to talk to a Education officer, museum about finding a new place to stay"  Pt. Stated "they do not take care of me in a timely manner at the [facility]".  Pt. Tearful during conversation.

## 2015-08-31 NOTE — ED Notes (Signed)
Sharyn Lull from ACT team at bedside.

## 2015-08-31 NOTE — ED Notes (Signed)
Assisted patient to bathroom and changed underwear. Patient back in bed

## 2015-08-31 NOTE — ED Notes (Signed)
Patient upset and wanting to pray, so I sit with her while she prayed.  She is upset that her family does not attempt to call her, and she never gets to see her grand-kids.  She is also upset that she is treated badly at her group home.  She says the staff there tell her to "F-off".  She says she is unable to fall asleep and would like something to help her.  Asking MD for orders.

## 2015-08-31 NOTE — ED Notes (Signed)
Patient reports pain to left shoulder/arm.  Patient given warm blanket to wrap around shoulder and arm for comfort.

## 2015-08-31 NOTE — NC FL2 (Signed)
[place] LEVEL OF CARE SCREENING TOOL     IDENTIFICATION  Patient Name: [name] [name] Birthdate: May 22, 1945 Sex: female Admission Date (Current Location): 08/30/2015  [facility] and [place] Number:  Engineering geologist and Address:  [facility], [address], [place], [place] [address]      Provider Number: [id number]  Attending Physician Name and Address:  No att. providers found  Relative Name and Phone Number:       Current Level of Care: Hospital Recommended Level of Care: [place] Prior Approval Number:    Date Approved/Denied:   PASRR Number:   [id number]  Discharge Plan: Other (Comment) (New ALF required)    Current Diagnoses: Patient Active Problem List   Diagnosis Date Noted  . Schizoaffective disorder, bipolar type ([place])   . Urinary incontinence 10/30/2014  . GERD (gastroesophageal reflux disease) 10/30/2014  . OSA (obstructive sleep apnea) 10/30/2014  . Osteoarthrosis, unspecified whether generalized or localized, involving lower leg 10/30/2014  . COPD (chronic obstructive pulmonary disease) ([place]) 10/30/2014  . Chronic diastolic CHF (congestive heart failure) ([place]) 05/15/2014  . Obesity   . History of colon polyps 03/09/2013  . Renal mass 06/26/2011  . Lytic bone lesion of hip 06/25/2011  . Hypertension 06/24/2011  . Diabetes mellitus ([place]) 06/24/2011    Orientation RESPIRATION BLADDER Height & Weight     Self, Time, Situation, Place  Normal (Diabetic) Continent Weight:   Height:     BEHAVIORAL SYMPTOMS/MOOD NEUROLOGICAL BOWEL NUTRITION STATUS      Continent Diet  AMBULATORY STATUS COMMUNICATION OF NEEDS Skin   Limited Assist Verbally Normal                       Personal Care Assistance Level of Assistance  Bathing, Feeding, Dressing, Total care Bathing Assistance: Limited assistance Feeding assistance: Limited assistance Dressing Assistance: Limited assistance Total Care  Assistance: Limited assistance   Functional Limitations Info  Sight, Hearing, Speech Sight Info: Adequate Hearing Info: Adequate Speech Info: Adequate    SPECIAL CARE FACTORS FREQUENCY                       Contractures      Additional Factors Info       Allergies: Haldol, Metformin, Prednisone, Raspberry     Isolation Precautions Info: Extended spectrum beta lactamase     Current Medications (08/31/2015):  This is the current hospital active medication list Current Facility-Administered Medications  Medication Dose Route Frequency Provider Last Rate Last Dose  . albuterol (PROVENTIL HFA;VENTOLIN HFA) 108 (90 Base) MCG/ACT inhaler 2 puff  2 puff Inhalation Q6H PRN Loney Hering, MD      . aspirin EC tablet 81 mg  81 mg Oral Daily Loney Hering, MD   81 mg at 08/31/15 0936  . atorvastatin (LIPITOR) tablet 20 mg  20 mg Oral QHS Loney Hering, MD      . benztropine (COGENTIN) tablet 0.5 mg  0.5 mg Oral BID Loney Hering, MD   0.5 mg at 08/31/15 0936  . carvedilol (COREG) tablet 3.125 mg  3.125 mg Oral BID Loney Hering, MD   3.125 mg at 08/31/15 0947  . dicyclomine (BENTYL) tablet 20 mg  20 mg Oral TID PRN Loney Hering, MD      . divalproex (DEPAKOTE) DR tablet 500 mg  500 mg Oral Q8H Clovis Fredrickson, MD      .  docusate sodium (COLACE) capsule 200 mg  200 mg Oral BID Loney Hering, MD   200 mg at 08/31/15 0936  . fluPHENAZine (PROLIXIN) tablet 5 mg  5 mg Oral Daily Loney Hering, MD   5 mg at 08/31/15 [id number]   And  . fluPHENAZine (PROLIXIN) tablet 15 mg  15 mg Oral QHS Loney Hering, MD      . fluPHENAZine decanoate (PROLIXIN) injection 25 mg  25 mg Intramuscular Q14 Days Loney Hering, MD   25 mg at 08/31/15 0932  . furosemide (LASIX) tablet 40 mg  40 mg Oral Daily Loney Hering, MD   40 mg at 08/31/15 0947  . glipiZIDE (GLUCOTROL) tablet 10 mg  10 mg Oral BID Loney Hering, MD   10 mg at 08/31/15 0931  . insulin glargine  (LANTUS) injection 15 Units  15 Units Subcutaneous QHS Loney Hering, MD      . lisinopril (PRINIVIL,ZESTRIL) tablet 40 mg  40 mg Oral Daily Loney Hering, MD   40 mg at 08/31/15 0940  . LORazepam (ATIVAN) tablet 0.5 mg  0.5 mg Oral QHS Loney Hering, MD      . meloxicam [facility]) tablet 15 mg  15 mg Oral Daily Loney Hering, MD   15 mg at 08/31/15 0946  . metoCLOPramide (REGLAN) tablet 10 mg  10 mg Oral Q6H PRN Loney Hering, MD      . potassium chloride SA (K-DUR,KLOR-CON) CR tablet 20 mEq  20 mEq Oral Daily Loney Hering, MD   20 mEq at 08/31/15 0947  . tiotropium (SPIRIVA) inhalation capsule 18 mcg  18 mcg Inhalation Daily Loney Hering, MD   18 mcg at 08/31/15 0955  . traMADol (ULTRAM) tablet 50 mg  50 mg Oral Q6H PRN Loney Hering, MD   50 mg at 08/31/15 1050  . verapamil (CALAN) tablet 40 mg  40 mg Oral Q6H Loney Hering, MD   40 mg at 08/31/15 [id number]   Current Outpatient Prescriptions  Medication Sig Dispense Refill  . albuterol (PROVENTIL HFA;VENTOLIN HFA) 108 (90 Base) MCG/ACT inhaler Inhale 2 puffs into the lungs every 6 (six) hours as needed for wheezing or shortness of breath. 1 Inhaler 0  . aspirin EC 81 MG tablet Take 81 mg by mouth daily.    Marland Kitchen atorvastatin (LIPITOR) 20 MG tablet Take 20 mg by mouth at bedtime.    . benztropine (COGENTIN) 0.5 MG tablet Take 1 tablet (0.5 mg total) by mouth 2 (two) times daily. 60 tablet 0  . carvedilol (COREG) 3.125 MG tablet Take 1 tablet (3.125 mg total) by mouth 2 (two) times daily. 180 tablet 3  . dicyclomine (BENTYL) 20 MG tablet Take 1 tablet (20 mg total) by mouth 3 (three) times daily as needed for spasms. 30 tablet 0  . divalproex (DEPAKOTE ER) 500 MG 24 hr tablet Take 500 mg by mouth daily.    Marland Kitchen docusate sodium (COLACE) 100 MG capsule Take 2 capsules (200 mg total) by mouth 2 (two) times daily. 120 capsule 0  . fluPHENAZine (PROLIXIN) 5 MG tablet Take 5-15 mg by mouth 2 (two) times daily. Pt takes one  tablet in the morning and three at bedtime.    . fluPHENAZine decanoate (PROLIXIN) 25 MG/ML injection Inject 1 mL (25 mg total) into the muscle every 14 (fourteen) days. 5 mL 0  . Fluticasone-Salmeterol (ADVAIR) 250-50 MCG/DOSE AEPB Inhale 1 puff into the lungs 2 (two) times daily.    Marland Kitchen  furosemide (LASIX) 40 MG tablet Take 40 mg by mouth daily.     Marland Kitchen glipiZIDE (GLUCOTROL) 10 MG tablet Take 10 mg by mouth 2 (two) times daily.    . insulin glargine (LANTUS) 100 UNIT/ML injection Inject 0.15 mLs (15 Units total) into the skin at bedtime. 10 mL 2  . lisinopril (PRINIVIL,ZESTRIL) 40 MG tablet Take 1 tablet (40 mg total) by mouth daily. 90 tablet 3  . LORazepam (ATIVAN) 0.5 MG tablet Take 0.5 mg by mouth at bedtime. Pt is also able to take an additional tablet daily if needed for anxiety.    . meloxicam (MOBIC) 15 MG tablet Take 15 mg by mouth daily.    . metoCLOPramide (REGLAN) 10 MG tablet Take 1 tablet (10 mg total) by mouth every 6 (six) hours as needed for nausea or vomiting. 12 tablet 1  . potassium chloride SA (K-DUR,KLOR-CON) 20 MEQ tablet Take 20 mEq by mouth daily.    . ranitidine (ZANTAC) 150 MG tablet Take 150 mg by mouth 2 (two) times daily.    . sitaGLIPtin (JANUVIA) 100 MG tablet Take 100 mg by mouth daily.    Marland Kitchen tiotropium (SPIRIVA) 18 MCG inhalation capsule Place 1 capsule (18 mcg total) into inhaler and inhale daily. 30 capsule 12  . traMADol (ULTRAM) 50 MG tablet Take 50 mg by mouth every 6 (six) hours as needed for moderate pain.     . verapamil (CALAN) 40 MG tablet Take 40 mg by mouth every 6 (six) hours.       Discharge Medications: Please see discharge summary for a list of discharge medications.  Relevant Imaging Results:  Relevant Lab Results:   Additional Information  [id number]  [name], [place]

## 2015-08-31 NOTE — ED Notes (Signed)
Pt requesting to see chaplain. Chaplain paged, will see pt shortly.

## 2015-08-31 NOTE — ED Provider Notes (Addendum)
-----------------------------------------   6:50 AM on 08/31/2015 -----------------------------------------   Blood pressure 206/106, pulse 90, temperature 98.6 F (37 C), temperature source Oral, resp. rate 16, SpO2 93 %.  The patient had no acute events since last update.  Calm and cooperative at this time.    The patient is awaiting evaluation by psych.  The patient did receive some Benadryl overnight to help her sleep.   Loney Hering, MD 08/31/15 [id number]  Loney Hering, MD 08/31/15 [phone]

## 2015-08-31 NOTE — Progress Notes (Signed)
LCSW consulted with [facility] act team and Dr Carman Ching. LCSW will complete assessment and FL2. Psychiatrist will contact act team to review current medications.   [employer] LCSW [phone]

## 2015-08-31 NOTE — ED Notes (Signed)

## 2015-08-31 NOTE — ED Notes (Signed)
Pt incontinent, assisted to bathroom, cleaned and new brief applied.
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
## 2015-09-01 DIAGNOSIS — F918 Other conduct disorders: Secondary | ICD-10-CM | POA: Diagnosis not present

## 2015-09-01 LAB — GLUCOSE, CAPILLARY
Glucose-Capillary: 82 mg/dL (ref 65–99)
Glucose-Capillary: 197 mg/dL — ABNORMAL HIGH (ref 65–99)

## 2015-09-01 MED ORDER — FLUPHENAZINE HCL 5 MG PO TABS
15.0000 mg | ORAL_TABLET | Freq: Every day | ORAL | Status: DC
  Administered 2015-09-01: 15 mg via ORAL
  Filled 2015-09-01 (×2): qty 3

## 2015-09-01 NOTE — ED Provider Notes (Signed)
  Physical Exam  BP 154/76 mmHg  Pulse 63  Temp(Src) 98.4 F (36.9 C) (Tympanic)  Resp 16  SpO2 93%  Physical Exam  ED Course  Procedures  MDM Psych saw patient yesterday. Thought she is ok for discharge. But nursing home came to see her and she is still not ready to go back. Called psychiatry again and they say that its a social work issue as she didn't meet inpatient admission criteria. Social work saw patient and will have nursing home come to reassess tomorrow   Wandra Arthurs, MD 09/01/15 1540
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
## 2015-09-01 NOTE — ED Provider Notes (Signed)
-----------------------------------------   6:44 AM on 09/01/2015 -----------------------------------------   Blood pressure 158/90, pulse 78, temperature 97.9 F (36.6 C), temperature source Oral, resp. rate 18, SpO2 93 %.  The patient had no acute events since last update.  Calm and cooperative at this time.  Disposition is pending per Psychiatry/Behavioral Medicine team recommendations.     Paulette Blanch, MD 09/01/15 [phone]

## 2015-09-01 NOTE — Progress Notes (Signed)
CSW called [facility] Admission Coordinator [phone]. CSW informed [facility] that pt has had a full psych evalution and is ready for d/c. [facility] reports that the facility will come to do their own assessment of the pt. Pending that assessment and a 72 hr hospital stay, facility will accept pt back upon d/c.  Georga Kaufmann, MSW, [place] ED Clinical Social Worker [phone]

## 2015-09-01 NOTE — Progress Notes (Signed)
Patient expressed concerns about whether she would be able to return to facility upon discharge given her disclosure of the conditions during her stay. Patient expresses grave fear that she will not be able to return to the facility based on comments made by their staff. Patient asked for ailing body parts (arms and legs) to be anointed with oil.

## 2015-09-01 NOTE — ED Notes (Signed)
Staff from the [facility] here to evaluate pt.  Dr Darl Householder states BM is to reevaluate today.
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
## 2015-09-02 DIAGNOSIS — F918 Other conduct disorders: Secondary | ICD-10-CM | POA: Diagnosis not present

## 2015-09-02 DIAGNOSIS — F250 Schizoaffective disorder, bipolar type: Secondary | ICD-10-CM | POA: Diagnosis not present

## 2015-09-02 DIAGNOSIS — Z046 Encounter for general psychiatric examination, requested by authority: Secondary | ICD-10-CM

## 2015-09-02 LAB — GLUCOSE, CAPILLARY
GLUCOSE-CAPILLARY: 148 mg/dL — AB (ref 65–99)
Glucose-Capillary: 125 mg/dL — ABNORMAL HIGH (ref 65–99)

## 2015-09-02 NOTE — ED Provider Notes (Signed)
Cleared for D/C by Dr. Bethena Midget, MD 09/02/15 1106

## 2015-09-02 NOTE — Progress Notes (Addendum)
CSW contacted [facility] APS and spoke with [facility] [phone]. CSW explained situation to APS worker. Porsha informed CSW that [facility] has no right to refuse pt from returning without a 30 day notice. If pt received an immediate d/c [facility] should have been able to produce documentation of that. Evlyn Clines will call [facility] to state that they need to accept pt back into their facility and then will call CSW back with update. No report of abandonment was made by CSW because Evlyn Clines stated there was already an APS case open on pt for the [facility].  Georga Kaufmann, MSW, [place]

## 2015-09-02 NOTE — Progress Notes (Signed)
CSW contacted [facility] and spoke with Marvel Plan [phone]. Facility will be over to pick pt up within the hour. Facility will need FL2, and d/c order.    Georga Kaufmann, MSW, [place]

## 2015-09-02 NOTE — Progress Notes (Signed)
°   09/02/15 1000  Clinical Encounter Type  Visited With Patient  Visit Type Follow-up;Spiritual support  Referral From Nurse  Consult/Referral To Chaplain  Spiritual Encounters  Spiritual Needs Prayer;Other (Comment) (Anointing with oil)  Stress Factors  Patient Stress Factors Health changes;Major life changes;Other (Comment) (Residential concerns persist)  Family Stress Factors None identified  Patient tends to get stirred emotionally concerning issues that are not primary to her care or overall health. Chaplain's office continues to provide on-going redirection and spiritual intervention by way of prayer and counseling. Patient requested to be anointed again as well.
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
## 2015-09-02 NOTE — ED Provider Notes (Signed)
-----------------------------------------   6:46 AM on 09/02/2015 -----------------------------------------   Blood pressure 160/100, pulse 79, temperature 98.1 F (36.7 C), temperature source Oral, resp. rate 19, SpO2 96 %.  The patient had no acute events since last update.  Calm and cooperative at this time.    Overnight hold recommended and reassessment in the morning. Patient needs placement in a group home.     Loney Hering, MD 09/02/15 [phone]
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
## 2015-09-17 ENCOUNTER — Ambulatory Visit: Payer: Medicare HMO | Admitting: Psychiatry

## 2015-09-18 ENCOUNTER — Emergency Department: Payer: Medicare HMO

## 2015-09-18 ENCOUNTER — Emergency Department
Admission: EM | Admit: 2015-09-18 | Discharge: 2015-09-18 | Disposition: A | Discharge status: Home / Self Care | Payer: Medicare HMO | Attending: Student | Admitting: Student

## 2015-09-18 ENCOUNTER — Encounter: Payer: Self-pay | Admitting: *Deleted

## 2015-09-18 DIAGNOSIS — Z87891 Personal history of nicotine dependence: Secondary | ICD-10-CM | POA: Diagnosis not present

## 2015-09-18 DIAGNOSIS — R079 Chest pain, unspecified: Secondary | ICD-10-CM | POA: Diagnosis present

## 2015-09-18 DIAGNOSIS — I2510 Atherosclerotic heart disease of native coronary artery without angina pectoris: Secondary | ICD-10-CM | POA: Insufficient documentation

## 2015-09-18 DIAGNOSIS — Z7982 Long term (current) use of aspirin: Secondary | ICD-10-CM | POA: Insufficient documentation

## 2015-09-18 DIAGNOSIS — I509 Heart failure, unspecified: Secondary | ICD-10-CM | POA: Diagnosis not present

## 2015-09-18 DIAGNOSIS — J449 Chronic obstructive pulmonary disease, unspecified: Secondary | ICD-10-CM | POA: Diagnosis not present

## 2015-09-18 DIAGNOSIS — Z794 Long term (current) use of insulin: Secondary | ICD-10-CM | POA: Insufficient documentation

## 2015-09-18 DIAGNOSIS — Z7951 Long term (current) use of inhaled steroids: Secondary | ICD-10-CM | POA: Insufficient documentation

## 2015-09-18 DIAGNOSIS — E119 Type 2 diabetes mellitus without complications: Secondary | ICD-10-CM | POA: Diagnosis not present

## 2015-09-18 DIAGNOSIS — R0602 Shortness of breath: Secondary | ICD-10-CM

## 2015-09-18 DIAGNOSIS — I110 Hypertensive heart disease with heart failure: Secondary | ICD-10-CM | POA: Insufficient documentation

## 2015-09-18 LAB — CBC WITH DIFFERENTIAL/PLATELET
Basophils Absolute: 0.1 10*3/uL (ref 0–0.1)
Basophils Relative: 1 %
Eosinophils Absolute: 0.2 10*3/uL (ref 0–0.7)
Eosinophils Relative: 2 %
HEMATOCRIT: 39.6 % (ref 35.0–47.0)
HEMOGLOBIN: 13.2 g/dL (ref 12.0–16.0)
LYMPHS ABS: 2.3 10*3/uL (ref 1.0–3.6)
Lymphocytes Relative: 23 %
MCH: 28.8 pg (ref 26.0–34.0)
MCHC: 33.3 g/dL (ref 32.0–36.0)
MCV: 86.5 fL (ref 80.0–100.0)
MONOS PCT: 13 %
Monocytes Absolute: 1.3 10*3/uL — ABNORMAL HIGH (ref 0.2–0.9)
NEUTROS ABS: 6.1 10*3/uL (ref 1.4–6.5)
NEUTROS PCT: 61 %
Platelets: 215 10*3/uL (ref 150–440)
RBC: 4.58 MIL/uL (ref 3.80–5.20)
RDW: 14.4 % (ref 11.5–14.5)
WBC: 9.9 10*3/uL (ref 3.6–11.0)

## 2015-09-18 LAB — COMPREHENSIVE METABOLIC PANEL
ALBUMIN: 3.5 g/dL (ref 3.5–5.0)
ALK PHOS: 67 U/L (ref 38–126)
ALT: 17 U/L (ref 14–54)
ANION GAP: 5 (ref 5–15)
AST: 23 U/L (ref 15–41)
BILIRUBIN TOTAL: 0.3 mg/dL (ref 0.3–1.2)
BUN: 19 mg/dL (ref 6–20)
CHLORIDE: 99 mmol/L — AB (ref 101–111)
CO2: 36 mmol/L — ABNORMAL HIGH (ref 22–32)
CREATININE: 0.96 mg/dL (ref 0.44–1.00)
Calcium: 9 mg/dL (ref 8.9–10.3)
GFR calc non Af Amer: 59 mL/min — ABNORMAL LOW (ref >60)
Glucose, Bld: 188 mg/dL — ABNORMAL HIGH (ref 65–99)
Potassium: 4 mmol/L (ref 3.5–5.1)
Sodium: 140 mmol/L (ref 135–145)
TOTAL PROTEIN: 7.4 g/dL (ref 6.5–8.1)

## 2015-09-18 LAB — TROPONIN I: Troponin I: <0.03 ng/mL (ref <0.03)

## 2015-09-18 LAB — BRAIN NATRIURETIC PEPTIDE: B NATRIURETIC PEPTIDE 5: 30 pg/mL (ref 0.0–100.0)

## 2015-09-18 MED ORDER — IOPAMIDOL (ISOVUE-370) INJECTION 76%
75.0000 mL | Freq: Once | INTRAVENOUS | Status: AC | PRN
  Administered 2015-09-18: 75 mL via INTRAVENOUS

## 2015-09-18 MED ORDER — IBUPROFEN 400 MG PO TABS
400.0000 mg | ORAL_TABLET | Freq: Once | ORAL | Status: AC
  Administered 2015-09-18: 400 mg via ORAL
  Filled 2015-09-18: qty 1

## 2015-09-18 NOTE — ED Notes (Signed)
Pt resting in bed, TV turned on for pt, pt updated to plan of care of CT scan and 2nd trop

## 2015-09-18 NOTE — ED Triage Notes (Signed)
Pt arrives via EMS from the [facility] with complaints of left sided chest pain, states increased SOB when preforming PT, pt states "I am falling apart", pt awake and alert upon arrival

## 2015-09-18 NOTE — ED Notes (Signed)
Tia at the [facility] given report on pt, son to come and transport pt
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
## 2015-09-18 NOTE — ED Provider Notes (Addendum)
[facility] Emergency Department Provider Note   ____________________________________________   First MD Initiated Contact with Patient 09/18/15 [phone]     (approximate)  I have reviewed the triage vital signs and the nursing notes.   HISTORY  Chief Complaint Chest Pain     Caveat - Patient is a poor historian secondary to schizophrenia. Information is obtained partly from her as well as staff at the [facility] where she lives.   HPI ARRIANNA [name] is a 70 y.o. female  with obstructive sleep apnea, previously noncompliant on her CPAP or BiPAP, diastolic CHF, ejection fraction 60%, schizophrenia, HTN, HLD who presents for evaluation of pain under the left arm/left chest as well as shortness of breath over the past 4 days, gradual onset, constant, mild to moderate, no modifying factors. Patient reports she is also had cough but does not think she has had fevers. No vomiting, diarrhea, or chills. She reports that movement of the left arm/last shoulder makes the pain in the left chest/under the left arm worse. Staff at the [facility] confirm that she has been complaining of chest pain since last night and has been coughing.   Past Medical History:  Diagnosis Date  . Anxiety   . Arthritis   . Bronchitis   . Bursitis   . CAD (coronary artery disease)   . CHF (congestive heart failure) ([place])   . Chronic cough   . Chronic kidney disease    shadow on x-ray  . COPD (chronic obstructive pulmonary disease) ([place])   . Depression   . DM2 (diabetes mellitus, type 2) ([place])   . Environmental allergies   . GERD (gastroesophageal reflux disease)   . Hypercholesteremia   . Hypertension   . Left ventricular outflow tract obstruction    a. echo 03/2014: EF 60-65%, hypernamic LV systolic fxn, mod LVH w/ LVOT gradient estimated at 68 mm Hg w/ valsalva, very small LV internal cavity size, mildly increased LV posterior wall thickness, mild Ao valve scl w/o stenosis, diastolic  dysfunction, normal RVSP  . Lower extremity edema   . LVH (left ventricular hypertrophy)    a. echo suggests long standing uncontrolled htn. she will not do well when dehydrated, LV cavity obliteration  . Muscle weakness   . Obesity   . On supplemental oxygen therapy    AS NEEDED  . OSA (obstructive sleep apnea)    does not use machine  . Osteoarthritis   . Schizophrenia ([place])   . Tremors of nervous system   . Wheezing     Patient Active Problem List   Diagnosis Date Noted  . Involuntary commitment 09/02/2015  . Schizoaffective disorder, bipolar type ([place])   . Urinary incontinence 10/30/2014  . GERD (gastroesophageal reflux disease) 10/30/2014  . OSA (obstructive sleep apnea) 10/30/2014  . Osteoarthrosis, unspecified whether generalized or localized, involving lower leg 10/30/2014  . COPD (chronic obstructive pulmonary disease) ([place]) 10/30/2014  . Chronic diastolic CHF (congestive heart failure) ([place]) 05/15/2014  . Obesity   . History of colon polyps 03/09/2013  . Renal mass 06/26/2011  . Lytic bone lesion of hip 06/25/2011  . Hypertension 06/24/2011  . Diabetes mellitus ([place]) 06/24/2011    Past Surgical History:  Procedure Laterality Date  . CATARACT EXTRACTION W/PHACO Left 09/10/2014   Procedure: CATARACT EXTRACTION PHACO AND INTRAOCULAR LENS PLACEMENT (IOC);  Surgeon: Birder Robson, MD;  Location: [facility] ORS;  Service: Ophthalmology;  Laterality: Left;  [place]: 00:44 AP%: 22.8 CDE: 10.24 Fluid lot [id number]  . CATARACT  EXTRACTION W/PHACO Right 10/15/2014   Procedure: CATARACT EXTRACTION PHACO AND INTRAOCULAR LENS PLACEMENT (IOC);  Surgeon: Birder Robson, MD;  Location: [facility] ORS;  Service: Ophthalmology;  Laterality: Right;  [place]: 00:52 AP:40.1 CDE:11.83 LOT PACK [id number]  . CHOLECYSTECTOMY    . COLONOSCOPY  04/2011   [facility] per patient incomplete  . EYE SURGERY    . JOINT REPLACEMENT     TKR  . KNEE RECONSTRUCTION, MEDIAL PATELLAR FEMORAL LIGAMENT    . RENAL BIOPSY      . TONSILLECTOMY    . TONSILLECTOMY      Prior to Admission medications   Medication Sig Start Date End Date Taking? Authorizing Provider  acetaminophen (TYLENOL) 325 MG tablet Take 650 mg by mouth every 4 (four) hours as needed.   Yes Historical Provider, MD  albuterol (PROVENTIL HFA;VENTOLIN HFA) 108 (90 Base) MCG/ACT inhaler Inhale 2 puffs into the lungs every 6 (six) hours as needed for wheezing or shortness of breath. 05/27/15  Yes Orbie Pyo, MD  aspirin EC 81 MG tablet Take 81 mg by mouth daily.   Yes Historical Provider, MD  sitaGLIPtin (JANUVIA) 100 MG tablet Take 100 mg by mouth daily.   Yes Historical Provider, MD  verapamil (CALAN) 40 MG tablet Take 40 mg by mouth every 6 (six) hours.   Yes Historical Provider, MD  atorvastatin (LIPITOR) 20 MG tablet Take 20 mg by mouth at bedtime.    Historical Provider, MD  benztropine (COGENTIN) 0.5 MG tablet Take 0.5 mg by mouth 2 (two) times daily.     Historical Provider, MD  carvedilol (COREG) 3.125 MG tablet Take 1 tablet (3.125 mg total) by mouth 2 (two) times daily. 06/11/15   Minna Merritts, MD  dicyclomine (BENTYL) 20 MG tablet Take 1 tablet (20 mg total) by mouth 3 (three) times daily as needed for spasms. 11/30/14 11/30/15  Orbie Pyo, MD  divalproex (DEPAKOTE ER) 500 MG 24 hr tablet Take 500 mg by mouth at bedtime.     Historical Provider, MD  docusate sodium (COLACE) 100 MG capsule Take 2 capsules (200 mg total) by mouth 2 (two) times daily. 11/05/14   Hildred Priest, MD  fluPHENAZine (PROLIXIN) 5 MG tablet Take 5-15 mg by mouth 2 (two) times daily. Pt takes one tablet in the morning and three at bedtime.    Historical Provider, MD  fluPHENAZine decanoate (PROLIXIN) 25 MG/ML injection Inject 1 mL (25 mg total) into the muscle every 14 (fourteen) days. 11/14/14   Hildred Priest, MD  Fluticasone-Salmeterol (ADVAIR) 250-50 MCG/DOSE AEPB Inhale 1 puff into the lungs 2 (two) times daily.     Historical Provider, MD  glipiZIDE (GLUCOTROL) 10 MG tablet Take 10 mg by mouth 2 (two) times daily.    Historical Provider, MD  insulin glargine (LANTUS) 100 UNIT/ML injection Inject 0.15 mLs (15 Units total) into the skin at bedtime. 11/05/14   Hildred Priest, MD  lisinopril (PRINIVIL,ZESTRIL) 40 MG tablet Take 1 tablet (40 mg total) by mouth daily. 06/11/15   Minna Merritts, MD  LORazepam (ATIVAN) 0.5 MG tablet Take 0.5 mg by mouth at bedtime. Pt is also able to take an additional tablet daily if needed for anxiety.    Historical Provider, MD  meloxicam (MOBIC) 15 MG tablet Take 15 mg by mouth daily.    Historical Provider, MD  metoCLOPramide (REGLAN) 10 MG tablet Take 1 tablet (10 mg total) by mouth every 6 (six) hours as needed for nausea or vomiting. 11/30/14   Shanon Brow  Talmadge Chad, MD  potassium chloride SA (K-DUR,KLOR-CON) 20 MEQ tablet Take 20 mEq by mouth daily.    Historical Provider, MD  ranitidine (ZANTAC) 150 MG tablet Take 150 mg by mouth 2 (two) times daily.    Historical Provider, MD  tiotropium (SPIRIVA) 18 MCG inhalation capsule Place 1 capsule (18 mcg total) into inhaler and inhale daily. 07/02/14   [name], MD  traMADol (ULTRAM) 50 MG tablet Take 50 mg by mouth every 6 (six) hours as needed for moderate pain.     Historical Provider, MD    Allergies Haldol [haloperidol decanoate]; Metformin; Prednisone; and Raspberry  Family History  Problem Relation Age of Onset  . Colon cancer Neg Hx   . Liver disease Neg Hx   . Heart attack Mother     Social History Social History  Substance Use Topics  . Smoking status: Former Smoker    Packs/day: 3.00    Years: 0.00  . Smokeless tobacco: Never Used     Comment: quit in 2009  . Alcohol use No     Comment: occ.    Review of Systems Constitutional: No fever/chills Eyes: No visual changes. ENT: No sore throat. Cardiovascular: +chest pain. Respiratory: + shortness of breath. Gastrointestinal: No  abdominal pain.  No nausea, no vomiting.  No diarrhea.  No constipation. Genitourinary: Negative for dysuria. Musculoskeletal: Negative for back pain. Skin: Negative for rash. Neurological: Negative for headaches, focal weakness or numbness.  10-point ROS otherwise negative.  ____________________________________________   PHYSICAL EXAM:  Vitals:   09/18/15 1215 09/18/15 1216 09/18/15 1300 09/18/15 1330  BP:  (!) 137/101 (!) 175/114 (!) 158/91  Pulse: 71 (!) 59 71 77  Resp:  16    Temp:  97.5 F (36.4 C)    TempSrc:  Oral    SpO2: 97% 98% 97% 99%  Weight:      Height:        VITAL SIGNS: ED Triage Vitals  Enc Vitals Group     BP      Pulse      Resp      Temp      Temp src      SpO2      Weight      Height      Head Circumference      Peak Flow      Pain Score      Pain Loc      Pain Edu?      Excl. in [place]?     Constitutional: Alert and oriented. Well appearing and in no acute distress. Eyes: Conjunctivae are normal. PERRL. EOMI. Head: Atraumatic. Nose: No congestion/rhinnorhea. Mouth/Throat: Mucous membranes are moist.  Oropharynx non-erythematous. Neck: No stridor.  Supple without meningismus. Cardiovascular: Normal rate, regular rhythm. Grossly normal heart sounds.  Good peripheral circulation. Respiratory: Normal respiratory effort.  No retractions. Lungs CTAB. Gastrointestinal: Soft and nontender. No distention.  No CVA tenderness. Genitourinary: deferred Musculoskeletal: Chronic pitting edema bilateral lower extremities. Patient has exacerbation of her chest pain with passive range of motion of the left shoulder though she does have full range of motion of the left shoulder. 2+ left radial pulse. Neurologic:  Normal speech and language. No gross focal neurologic deficits are appreciated. No gait instability. Skin:  Skin is warm, dry and intact. No rash noted. Psychiatric: Mood and affect are normal. Speech and behavior are  normal.  ____________________________________________   LABS (all labs ordered are listed, but only abnormal results are displayed)  Labs Reviewed  CBC WITH DIFFERENTIAL/PLATELET - Abnormal; Notable for the following:       Result Value   Monocytes Absolute 1.3 (*)    All other components within normal limits  COMPREHENSIVE METABOLIC PANEL - Abnormal; Notable for the following:    Chloride 99 (*)    CO2 36 (*)    Glucose, Bld 188 (*)    GFR calc non Af Amer 59 (*)    All other components within normal limits  TROPONIN I  BRAIN NATRIURETIC PEPTIDE  TROPONIN I   ____________________________________________  EKG  ED ECG REPORT I, Joanne Gavel, the attending physician, personally viewed and interpreted this ECG.   Date: 09/18/2015  EKG Time: 08:10  Rate: 79  Rhythm: unchanged from previous tracings, sinus rhythm with PACs.  Axis: normal  Intervals:none  ST&T Change: No acute ST elevation or acute ST depression. Chronic T-wave inversions in V4 and V5. EKG unchanged from 07/21/2015.  ____________________________________________  RADIOLOGY  CXR IMPRESSION: Mild diffuse interstitial prominence with cardiomegaly may reflect mild CHF. Suspect left lower lobe atelectasis or possible pneumonia. The study is limited due to hypoinflation. A repeat PA and lateral chest x-ray with deep inspiration would be Useful.  CTA chest IMPRESSION: Incomplete visualization of solid right renal mass. Suspect renal cell carcinoma. Urologic consultation advised. No demonstrable pulmonary embolus. No parenchymal lung edema or consolidation. Slight lower lobe bronchiectatic change. No appreciable adenopathy. Foci of coronary calcification noted. There is left ventricular hypertrophy. Apparent adenomas in the left adrenal, stable. These results were called by telephone at the time of interpretation on 09/18/2015 at 1:00 pm to Dr. Loura Pardon , who verbally acknowledged these  results.  ____________________________________________   PROCEDURES  Procedure(s) performed: None  Procedures  Critical Care performed: No  ____________________________________________   INITIAL IMPRESSION / ASSESSMENT AND PLAN / ED COURSE  Pertinent labs & imaging results that were available during my care of the patient were reviewed by me and considered in my medical decision making (see chart for details).  VIBHA DONAHO is a 70 y.o. female  with obstructive sleep apnea, previously noncompliant on her CPAP or BiPAP, diastolic CHF, ejection fraction 60%, schizophrenia, HTN, HLD who presents for evaluation of pain under the left arm/left chest as well as shortness of breath over the past 4 days. On exam, she is generally nontoxic appearing and in no acute distress. Her vital signs are stable, she is afebrile. No increased work of breathing, no increased or new oxygen requirement at this time. EKG is unchanged from prior, not consistent with acute ischemia. We'll obtain screening labs, chest x-ray, BMP and reassess for disposition. Suspect that this may represent muscular skeletal pain given she has been seen in this emergency department previously for left sided shoulder pain and movement of the left shoulder appears to exacerbate her symptoms.  ----------------------------------------- 1:36 PM on 09/18/2015 -----------------------------------------  2 sets of troponins are negative, I doubt that this represents ACS. EKG is unchanged from prior. Labs generally unchanged from prior. Chest x-ray was concerning for pneumonia versus atelectasis in the left lower lobe. CTA chest shows no PE, no pneumonia. Right renal mass is again noted. I discussed this with the patient's son, he discussed that this renal mass is known and that she is under surveillance I suspect her pain is musculoskeletal in nature. Her vital signs are stable though she does have asymptomatic hypertension. DC with return  precautions and close PCP follow-up. She and Her son is comfortable with this  plan.  Clinical Course     ____________________________________________   FINAL CLINICAL IMPRESSION(S) / ED DIAGNOSES  Final diagnoses:  Chest pain, unspecified chest pain type      NEW MEDICATIONS STARTED DURING THIS VISIT:  New Prescriptions   No medications on file     Note:  This document was prepared using Dragon voice recognition software and may include unintentional dictation errors.    Joanne Gavel, MD 09/18/15 1339    Joanne Gavel, MD 09/18/15 1343

## 2015-10-12 ENCOUNTER — Encounter: Payer: Self-pay | Admitting: Emergency Medicine

## 2015-10-12 ENCOUNTER — Emergency Department: Payer: Medicare HMO

## 2015-10-12 ENCOUNTER — Inpatient Hospital Stay
Admission: EM | Admit: 2015-10-12 | Discharge: 2015-10-22 | DRG: 189 | Disposition: A | Discharge status: Skilled Nursing Facility | Payer: Medicare HMO | Attending: Internal Medicine | Admitting: Internal Medicine

## 2015-10-12 DIAGNOSIS — I10 Essential (primary) hypertension: Secondary | ICD-10-CM | POA: Diagnosis present

## 2015-10-12 DIAGNOSIS — Z791 Long term (current) use of non-steroidal anti-inflammatories (NSAID): Secondary | ICD-10-CM

## 2015-10-12 DIAGNOSIS — I110 Hypertensive heart disease with heart failure: Secondary | ICD-10-CM | POA: Diagnosis present

## 2015-10-12 DIAGNOSIS — Z6841 Body Mass Index (BMI) 40.0 and over, adult: Secondary | ICD-10-CM

## 2015-10-12 DIAGNOSIS — Z79899 Other long term (current) drug therapy: Secondary | ICD-10-CM

## 2015-10-12 DIAGNOSIS — I5033 Acute on chronic diastolic (congestive) heart failure: Secondary | ICD-10-CM | POA: Diagnosis present

## 2015-10-12 DIAGNOSIS — K219 Gastro-esophageal reflux disease without esophagitis: Secondary | ICD-10-CM | POA: Diagnosis present

## 2015-10-12 DIAGNOSIS — Z9119 Patient's noncompliance with other medical treatment and regimen: Secondary | ICD-10-CM

## 2015-10-12 DIAGNOSIS — R0602 Shortness of breath: Secondary | ICD-10-CM

## 2015-10-12 DIAGNOSIS — J9622 Acute and chronic respiratory failure with hypercapnia: Principal | ICD-10-CM | POA: Diagnosis present

## 2015-10-12 DIAGNOSIS — N2889 Other specified disorders of kidney and ureter: Secondary | ICD-10-CM | POA: Diagnosis present

## 2015-10-12 DIAGNOSIS — J9621 Acute and chronic respiratory failure with hypoxia: Secondary | ICD-10-CM | POA: Diagnosis present

## 2015-10-12 DIAGNOSIS — I2510 Atherosclerotic heart disease of native coronary artery without angina pectoris: Secondary | ICD-10-CM | POA: Diagnosis present

## 2015-10-12 DIAGNOSIS — R0689 Other abnormalities of breathing: Secondary | ICD-10-CM | POA: Diagnosis not present

## 2015-10-12 DIAGNOSIS — E662 Morbid (severe) obesity with alveolar hypoventilation: Secondary | ICD-10-CM | POA: Diagnosis present

## 2015-10-12 DIAGNOSIS — Z66 Do not resuscitate: Secondary | ICD-10-CM | POA: Diagnosis present

## 2015-10-12 DIAGNOSIS — N179 Acute kidney failure, unspecified: Secondary | ICD-10-CM | POA: Diagnosis present

## 2015-10-12 DIAGNOSIS — Z794 Long term (current) use of insulin: Secondary | ICD-10-CM

## 2015-10-12 DIAGNOSIS — Z7982 Long term (current) use of aspirin: Secondary | ICD-10-CM

## 2015-10-12 DIAGNOSIS — G9340 Encephalopathy, unspecified: Secondary | ICD-10-CM | POA: Diagnosis present

## 2015-10-12 DIAGNOSIS — Z96659 Presence of unspecified artificial knee joint: Secondary | ICD-10-CM | POA: Diagnosis present

## 2015-10-12 DIAGNOSIS — R000 Tachycardia, unspecified: Secondary | ICD-10-CM | POA: Diagnosis present

## 2015-10-12 DIAGNOSIS — Z9981 Dependence on supplemental oxygen: Secondary | ICD-10-CM

## 2015-10-12 DIAGNOSIS — F250 Schizoaffective disorder, bipolar type: Secondary | ICD-10-CM | POA: Diagnosis present

## 2015-10-12 DIAGNOSIS — Z87891 Personal history of nicotine dependence: Secondary | ICD-10-CM

## 2015-10-12 DIAGNOSIS — R0600 Dyspnea, unspecified: Secondary | ICD-10-CM

## 2015-10-12 DIAGNOSIS — Z8249 Family history of ischemic heart disease and other diseases of the circulatory system: Secondary | ICD-10-CM

## 2015-10-12 DIAGNOSIS — E7800 Pure hypercholesterolemia, unspecified: Secondary | ICD-10-CM | POA: Diagnosis present

## 2015-10-12 DIAGNOSIS — J449 Chronic obstructive pulmonary disease, unspecified: Secondary | ICD-10-CM | POA: Diagnosis present

## 2015-10-12 LAB — COMPREHENSIVE METABOLIC PANEL
ALBUMIN: 3.7 g/dL (ref 3.5–5.0)
ALK PHOS: 73 U/L (ref 38–126)
ALT: 16 U/L (ref 14–54)
ANION GAP: 5 (ref 5–15)
AST: 21 U/L (ref 15–41)
BILIRUBIN TOTAL: 0.2 mg/dL — AB (ref 0.3–1.2)
BUN: 16 mg/dL (ref 6–20)
CALCIUM: 8.8 mg/dL — AB (ref 8.9–10.3)
CO2: 35 mmol/L — AB (ref 22–32)
Chloride: 102 mmol/L (ref 101–111)
Creatinine, Ser: 0.82 mg/dL (ref 0.44–1.00)
GFR calc Af Amer: >60 mL/min (ref >60)
GFR calc non Af Amer: >60 mL/min (ref >60)
GLUCOSE: 143 mg/dL — AB (ref 65–99)
Potassium: 4.5 mmol/L (ref 3.5–5.1)
SODIUM: 142 mmol/L (ref 135–145)
TOTAL PROTEIN: 7.5 g/dL (ref 6.5–8.1)

## 2015-10-12 LAB — CBC
HEMATOCRIT: 40.2 % (ref 35.0–47.0)
HEMOGLOBIN: 13.3 g/dL (ref 12.0–16.0)
MCH: 28.6 pg (ref 26.0–34.0)
MCHC: 33 g/dL (ref 32.0–36.0)
MCV: 86.5 fL (ref 80.0–100.0)
Platelets: 210 10*3/uL (ref 150–440)
RBC: 4.65 MIL/uL (ref 3.80–5.20)
RDW: 14 % (ref 11.5–14.5)
WBC: 9.9 10*3/uL (ref 3.6–11.0)

## 2015-10-12 LAB — BRAIN NATRIURETIC PEPTIDE: B Natriuretic Peptide: 37 pg/mL (ref 0.0–100.0)

## 2015-10-12 LAB — TROPONIN I: Troponin I: <0.03 ng/mL (ref <0.03)

## 2015-10-12 NOTE — ED Provider Notes (Signed)
[facility] Emergency Department Provider Note  Time seen: 7:48 PM  I have reviewed the triage vital signs and the nursing notes.   HISTORY  Chief Complaint Shortness of Breath    HPI [name] [name] is a 70 y.o. female with a past medical history CAD, CHF, COPD, diabetes, hypertension, hyperlipidemia, on 2 L via nasal cannula 24/7, who presents the emergency department shortness breath. According to the son for the past 2 days the patient has appears somewhat short of breath with increased lower external swelling. Patient denies any chest pain. Denies any cough or fever.  Patient denies any shortness of breath all at rest in the bed.  Past Medical History:  Diagnosis Date  . Anxiety   . Arthritis   . Bronchitis   . Bursitis   . CAD (coronary artery disease)   . CHF (congestive heart failure) ([place])   . Chronic cough   . Chronic kidney disease    shadow on x-ray  . COPD (chronic obstructive pulmonary disease) ([place])   . Depression   . DM2 (diabetes mellitus, type 2) ([place])   . Environmental allergies   . GERD (gastroesophageal reflux disease)   . Hypercholesteremia   . Hypertension   . Left ventricular outflow tract obstruction    a. echo 03/2014: EF 60-65%, hypernamic LV systolic fxn, mod LVH w/ LVOT gradient estimated at 68 mm Hg w/ valsalva, very small LV internal cavity size, mildly increased LV posterior wall thickness, mild Ao valve scl w/o stenosis, diastolic dysfunction, normal RVSP  . Lower extremity edema   . LVH (left ventricular hypertrophy)    a. echo suggests long standing uncontrolled htn. she will not do well when dehydrated, LV cavity obliteration  . Muscle weakness   . Obesity   . On supplemental oxygen therapy    AS NEEDED  . OSA (obstructive sleep apnea)    does not use machine  . Osteoarthritis   . Schizophrenia ([place])   . Tremors of nervous system   . Wheezing     Patient Active Problem List   Diagnosis Date Noted  .  Involuntary commitment 09/02/2015  . Schizoaffective disorder, bipolar type ([place])   . Urinary incontinence 10/30/2014  . GERD (gastroesophageal reflux disease) 10/30/2014  . OSA (obstructive sleep apnea) 10/30/2014  . Osteoarthrosis, unspecified whether generalized or localized, involving lower leg 10/30/2014  . COPD (chronic obstructive pulmonary disease) ([place]) 10/30/2014  . Chronic diastolic CHF (congestive heart failure) ([place]) 05/15/2014  . Obesity   . History of colon polyps 03/09/2013  . Renal mass 06/26/2011  . Lytic bone lesion of hip 06/25/2011  . Hypertension 06/24/2011  . Diabetes mellitus ([place]) 06/24/2011    Past Surgical History:  Procedure Laterality Date  . CATARACT EXTRACTION W/PHACO Left 09/10/2014   Procedure: CATARACT EXTRACTION PHACO AND INTRAOCULAR LENS PLACEMENT (IOC);  Surgeon: Birder Robson, MD;  Location: [facility] ORS;  Service: Ophthalmology;  Laterality: Left;  [place]: 00:44 AP%: 22.8 CDE: 10.24 Fluid lot [id number]  . CATARACT EXTRACTION W/PHACO Right 10/15/2014   Procedure: CATARACT EXTRACTION PHACO AND INTRAOCULAR LENS PLACEMENT (IOC);  Surgeon: Birder Robson, MD;  Location: [facility] ORS;  Service: Ophthalmology;  Laterality: Right;  [place]: 00:52 AP:40.1 CDE:11.83 LOT PACK [id number]  . CHOLECYSTECTOMY    . COLONOSCOPY  04/2011   [facility] per patient incomplete  . EYE SURGERY    . JOINT REPLACEMENT     TKR  . KNEE RECONSTRUCTION, MEDIAL PATELLAR FEMORAL LIGAMENT    . RENAL BIOPSY    .  TONSILLECTOMY    . TONSILLECTOMY      Prior to Admission medications   Medication Sig Start Date End Date Taking? Authorizing Provider  acetaminophen (TYLENOL) 325 MG tablet Take 650 mg by mouth every 4 (four) hours as needed.    Historical Provider, MD  albuterol (PROVENTIL HFA;VENTOLIN HFA) 108 (90 Base) MCG/ACT inhaler Inhale 2 puffs into the lungs every 6 (six) hours as needed for wheezing or shortness of breath. 05/27/15   Orbie Pyo, MD  aspirin EC 81 MG tablet Take  81 mg by mouth daily.    Historical Provider, MD  atorvastatin (LIPITOR) 20 MG tablet Take 20 mg by mouth at bedtime.    Historical Provider, MD  benztropine (COGENTIN) 0.5 MG tablet Take 0.5 mg by mouth 2 (two) times daily.     Historical Provider, MD  carvedilol (COREG) 3.125 MG tablet Take 1 tablet (3.125 mg total) by mouth 2 (two) times daily. 06/11/15   Minna Merritts, MD  dicyclomine (BENTYL) 20 MG tablet Take 1 tablet (20 mg total) by mouth 3 (three) times daily as needed for spasms. 11/30/14 11/30/15  Orbie Pyo, MD  divalproex (DEPAKOTE ER) 500 MG 24 hr tablet Take 500 mg by mouth at bedtime.     Historical Provider, MD  docusate sodium (COLACE) 100 MG capsule Take 2 capsules (200 mg total) by mouth 2 (two) times daily. 11/05/14   Hildred Priest, MD  fluPHENAZine (PROLIXIN) 5 MG tablet Take 5-15 mg by mouth 2 (two) times daily. Pt takes one tablet in the morning and three at bedtime.    Historical Provider, MD  fluPHENAZine decanoate (PROLIXIN) 25 MG/ML injection Inject 1 mL (25 mg total) into the muscle every 14 (fourteen) days. 11/14/14   Hildred Priest, MD  Fluticasone-Salmeterol (ADVAIR) 250-50 MCG/DOSE AEPB Inhale 1 puff into the lungs 2 (two) times daily.    Historical Provider, MD  glipiZIDE (GLUCOTROL) 10 MG tablet Take 10 mg by mouth 2 (two) times daily.    Historical Provider, MD  insulin glargine (LANTUS) 100 UNIT/ML injection Inject 0.15 mLs (15 Units total) into the skin at bedtime. 11/05/14   Hildred Priest, MD  lisinopril (PRINIVIL,ZESTRIL) 40 MG tablet Take 1 tablet (40 mg total) by mouth daily. 06/11/15   Minna Merritts, MD  LORazepam (ATIVAN) 0.5 MG tablet Take 0.5 mg by mouth at bedtime. Pt is also able to take an additional tablet daily if needed for anxiety.    Historical Provider, MD  meloxicam (MOBIC) 15 MG tablet Take 15 mg by mouth daily.    Historical Provider, MD  metoCLOPramide (REGLAN) 10 MG tablet Take 1 tablet (10 mg  total) by mouth every 6 (six) hours as needed for nausea or vomiting. 11/30/14   Orbie Pyo, MD  potassium chloride SA (K-DUR,KLOR-CON) 20 MEQ tablet Take 20 mEq by mouth daily.    Historical Provider, MD  ranitidine (ZANTAC) 150 MG tablet Take 150 mg by mouth 2 (two) times daily.    Historical Provider, MD  sitaGLIPtin (JANUVIA) 100 MG tablet Take 100 mg by mouth daily.    Historical Provider, MD  tiotropium (SPIRIVA) 18 MCG inhalation capsule Place 1 capsule (18 mcg total) into inhaler and inhale daily. 07/02/14   [name], MD  traMADol (ULTRAM) 50 MG tablet Take 50 mg by mouth every 6 (six) hours as needed for moderate pain.     Historical Provider, MD  verapamil (CALAN) 40 MG tablet Take 40 mg by mouth every 6 (  six) hours.    Historical Provider, MD    Allergies  Allergen Reactions  . Haldol [Haloperidol Decanoate] Swelling and Other (See Comments)    Reaction:  Swelling of tongue and blurred vision    . Metformin Diarrhea  . Prednisone Other (See Comments)    Unknown reaction  . Raspberry Swelling and Other (See Comments)    Reaction:  Swelling of lips     Family History  Problem Relation Age of Onset  . Heart attack Mother   . Colon cancer Neg Hx   . Liver disease Neg Hx     Social History Social History  Substance Use Topics  . Smoking status: Former Smoker    Packs/day: 3.00    Years: 40.00  . Smokeless tobacco: Never Used     Comment: quit in 2009  . Alcohol use No     Comment: occ.    Review of Systems Constitutional: Negative for fever. Cardiovascular: Negative for chest pain. Respiratory: Shortness of breath for the past 2 or 3 days. Gastrointestinal: Negative for abdominal pain Musculoskeletal: Negative for back pain. Skin: Negative for rash. Neurological: Negative for headache 10-point ROS otherwise negative.  ____________________________________________   PHYSICAL EXAM:  VITAL SIGNS: ED Triage Vitals  Enc Vitals Group     BP  10/12/15 1901 (!) 198/108     Pulse Rate 10/12/15 1901 89     Resp 10/12/15 1901 (!) 29     Temp 10/12/15 1901 98.1 F (36.7 C)     Temp Source 10/12/15 1901 Oral     SpO2 10/12/15 1901 99 %     Weight 10/12/15 1901 286 lb (129.7 kg)     Height 10/12/15 1901 5\' 6"  (1.676 m)     Head Circumference --      Peak Flow --      Pain Score 10/12/15 1907 0     Pain Loc --      Pain Edu? --      Excl. in [place]? --     Constitutional: Alert and oriented. Well appearing and in no distress. Eyes: Normal exam ENT   Head: Normocephalic and atraumatic.   Mouth/Throat: Mucous membranes are moist. Cardiovascular: Normal rate, regular rhythm. No murmur Respiratory: Normal respiratory effort without tachypnea nor retractions.  Gastrointestinal: Soft and nontender. No distention.  Musculoskeletal: Nontender with normal range of motion in all extremities. Moderate lower extremity edema, equal bilaterally, no calf tenderness, compression stockings in place. Neurologic:  Normal speech and language. No gross focal neurologic deficits are appreciated. Skin:  Skin is warm, dry and intact.  Psychiatric: Mood and affect are normal. Speech and behavior are normal.   ____________________________________________     RADIOLOGY  Chest x-ray negative  ____________________________________________   INITIAL IMPRESSION / ASSESSMENT AND PLAN / ED COURSE  Pertinent labs & imaging results that were available during my care of the patient were reviewed by me and considered in my medical decision making (see chart for details).  Patient presents emergency department 2 days of shortness breath. Patient satting 98% currently on 2 L via nasal cannula which is the patient's baseline. Clear lung sounds bilaterally, moderate lower extremity edema but equal bilaterally, compression stockings in place. We will check labs, EKG, chest x-ray and closely monitor in the emergency department. The patient is hypertensive  currently 180/135. We will dose of oral clonidine.  Chest x-ray negative. Labs pending. Patient care signed out to oncoming physician.  ____________________________________________   FINAL CLINICAL IMPRESSION(S) / ED DIAGNOSES  Shortness breath    Harvest Dark, MD 10/16/15 1504

## 2015-10-12 NOTE — ED Provider Notes (Signed)
Vitals:   10/12/15 2115 10/12/15 2200  BP: (!) 164/99 (!) 177/88  Pulse: 87 93  Resp: (!) 26 (!) 23  Temp:       Patient currently resting comfortably. Alert, no complaint. Patient denies feeling short of breath at this time. Currently saturating normally on room air, though normally is on 2 L. EtC02 35 by nasal cannula.  Overall her evaluation including labs, chest x-ray, and BNP do not demonstrate acute instability or obvious need for admission. The patient is currently denying shortness of breath, and her work of breathing is comfortable.  Dg Chest 2 View  Result Date: 10/12/2015 CLINICAL DATA:  Shortness of breath for a few days. On home oxygen. History of bronchitis, congestive heart failure, diabetes and chronic kidney disease. EXAM: CHEST  2 VIEW COMPARISON:  CT and radiographs 09/18/2015. FINDINGS: Stable mild cardiomegaly. The overall pulmonary aeration has improved. There is no confluent airspace opacity, pleural effusion or pneumothorax. No acute osseous findings are demonstrated. There are glenohumeral degenerative changes bilaterally. IMPRESSION: No active cardiopulmonary process. Please note finding of enlarging solid mass involving the right kidney on chest CT from last month, worrisome for renal cell carcinoma. Electronically Signed   By: Richardean Sale M.D.   On: 10/12/2015 20:40  Known renal mass, under surveillance per patient's family.   Did discuss with the patient's son, and he reports the patient was quite short of breath earlier however I do not find this in the emergency room at this time.  I discussed with the patient's son, and she is currently at the assisted living center. She does have mild lower extremity edema, it is wearing compression stockings and her BNP and chest x-ray do not support evidence of acute pulmonary edema or obvious congestive heart failure. The patient does have a history of hypercapnic respiratory failure, though she is alert her son does report  increased dyspnea. I suspect that she is stable, and likely some chronic retention however we'll send a venous blood gas to further evaluate prior to planned discharge if normal or chronic in nature, if acute hypercapnea then likely will require admission.  Ongoing care including follow-up on venous blood gas, repeat VBG, and signed out Dr. Dahlia Client at 11:20 PM.   Delman Kitten, MD 10/12/15 2318

## 2015-10-12 NOTE — ED Triage Notes (Signed)
Pt presents to ED via EMS from [facility] c/o increasing shortness of breath over the past few weeks. Pt's son states pt has had to have fluid pulled off her lungs in the past and he wanted to bring her in before it got so severe.

## 2015-10-12 NOTE — ED Notes (Signed)
Pt gave permission to update her son on test results. Son given update. Son became upset at hearing that pt will likely be able to go home tonight and that her workup is grossly WNL at this time. Son concerned that pt will develop pneumonia and fluid in her lungs, that she can't breathe laying flat, and that her legs have swelling. Pt sitting upright, NAD at this time. Son demanded to speak with MD, EDP spoke to son over phone as well.

## 2015-10-13 ENCOUNTER — Inpatient Hospital Stay: Payer: Medicare HMO

## 2015-10-13 DIAGNOSIS — E662 Morbid (severe) obesity with alveolar hypoventilation: Secondary | ICD-10-CM | POA: Diagnosis present

## 2015-10-13 DIAGNOSIS — I10 Essential (primary) hypertension: Secondary | ICD-10-CM | POA: Diagnosis present

## 2015-10-13 DIAGNOSIS — Z7982 Long term (current) use of aspirin: Secondary | ICD-10-CM | POA: Diagnosis not present

## 2015-10-13 DIAGNOSIS — J9601 Acute respiratory failure with hypoxia: Secondary | ICD-10-CM | POA: Diagnosis not present

## 2015-10-13 DIAGNOSIS — Z8249 Family history of ischemic heart disease and other diseases of the circulatory system: Secondary | ICD-10-CM | POA: Diagnosis not present

## 2015-10-13 DIAGNOSIS — I110 Hypertensive heart disease with heart failure: Secondary | ICD-10-CM | POA: Diagnosis present

## 2015-10-13 DIAGNOSIS — Z66 Do not resuscitate: Secondary | ICD-10-CM | POA: Diagnosis present

## 2015-10-13 DIAGNOSIS — J9622 Acute and chronic respiratory failure with hypercapnia: Secondary | ICD-10-CM

## 2015-10-13 DIAGNOSIS — I2510 Atherosclerotic heart disease of native coronary artery without angina pectoris: Secondary | ICD-10-CM | POA: Diagnosis present

## 2015-10-13 DIAGNOSIS — Z6841 Body Mass Index (BMI) 40.0 and over, adult: Secondary | ICD-10-CM | POA: Diagnosis not present

## 2015-10-13 DIAGNOSIS — I5033 Acute on chronic diastolic (congestive) heart failure: Secondary | ICD-10-CM | POA: Diagnosis present

## 2015-10-13 DIAGNOSIS — G9340 Encephalopathy, unspecified: Secondary | ICD-10-CM | POA: Diagnosis present

## 2015-10-13 DIAGNOSIS — R000 Tachycardia, unspecified: Secondary | ICD-10-CM | POA: Diagnosis present

## 2015-10-13 DIAGNOSIS — Z79899 Other long term (current) drug therapy: Secondary | ICD-10-CM | POA: Diagnosis not present

## 2015-10-13 DIAGNOSIS — Z794 Long term (current) use of insulin: Secondary | ICD-10-CM | POA: Diagnosis not present

## 2015-10-13 DIAGNOSIS — Z87891 Personal history of nicotine dependence: Secondary | ICD-10-CM | POA: Diagnosis not present

## 2015-10-13 DIAGNOSIS — J9621 Acute and chronic respiratory failure with hypoxia: Secondary | ICD-10-CM | POA: Diagnosis present

## 2015-10-13 DIAGNOSIS — J9602 Acute respiratory failure with hypercapnia: Secondary | ICD-10-CM

## 2015-10-13 DIAGNOSIS — Z9119 Patient's noncompliance with other medical treatment and regimen: Secondary | ICD-10-CM | POA: Diagnosis not present

## 2015-10-13 DIAGNOSIS — Z791 Long term (current) use of non-steroidal anti-inflammatories (NSAID): Secondary | ICD-10-CM | POA: Diagnosis not present

## 2015-10-13 DIAGNOSIS — F250 Schizoaffective disorder, bipolar type: Secondary | ICD-10-CM | POA: Diagnosis present

## 2015-10-13 DIAGNOSIS — E7800 Pure hypercholesterolemia, unspecified: Secondary | ICD-10-CM | POA: Diagnosis present

## 2015-10-13 DIAGNOSIS — R0689 Other abnormalities of breathing: Secondary | ICD-10-CM | POA: Diagnosis present

## 2015-10-13 DIAGNOSIS — Z9981 Dependence on supplemental oxygen: Secondary | ICD-10-CM | POA: Diagnosis not present

## 2015-10-13 DIAGNOSIS — N179 Acute kidney failure, unspecified: Secondary | ICD-10-CM | POA: Diagnosis present

## 2015-10-13 DIAGNOSIS — K219 Gastro-esophageal reflux disease without esophagitis: Secondary | ICD-10-CM | POA: Diagnosis present

## 2015-10-13 DIAGNOSIS — J449 Chronic obstructive pulmonary disease, unspecified: Secondary | ICD-10-CM | POA: Diagnosis present

## 2015-10-13 DIAGNOSIS — N2889 Other specified disorders of kidney and ureter: Secondary | ICD-10-CM | POA: Diagnosis present

## 2015-10-13 LAB — URINALYSIS COMPLETE WITH MICROSCOPIC (ARMC ONLY)
BILIRUBIN URINE: NEGATIVE
Bacteria, UA: NONE SEEN
Glucose, UA: NEGATIVE mg/dL
Hgb urine dipstick: NEGATIVE
KETONES UR: NEGATIVE mg/dL
LEUKOCYTES UA: NEGATIVE
NITRITE: NEGATIVE
PH: 6 (ref 5.0–8.0)
PROTEIN: NEGATIVE mg/dL
SPECIFIC GRAVITY, URINE: 1.013 (ref 1.005–1.030)

## 2015-10-13 LAB — BASIC METABOLIC PANEL
ANION GAP: 5 (ref 5–15)
BUN: 13 mg/dL (ref 6–20)
CO2: 35 mmol/L — ABNORMAL HIGH (ref 22–32)
Calcium: 8.9 mg/dL (ref 8.9–10.3)
Chloride: 102 mmol/L (ref 101–111)
Creatinine, Ser: 0.74 mg/dL (ref 0.44–1.00)
GFR calc Af Amer: >60 mL/min (ref >60)
Glucose, Bld: 115 mg/dL — ABNORMAL HIGH (ref 65–99)
POTASSIUM: 4.9 mmol/L (ref 3.5–5.1)
SODIUM: 142 mmol/L (ref 135–145)

## 2015-10-13 LAB — CBC
HCT: 42.7 % (ref 35.0–47.0)
Hemoglobin: 13.9 g/dL (ref 12.0–16.0)
MCH: 28.6 pg (ref 26.0–34.0)
MCHC: 32.5 g/dL (ref 32.0–36.0)
MCV: 88 fL (ref 80.0–100.0)
PLATELETS: 213 10*3/uL (ref 150–440)
RBC: 4.85 MIL/uL (ref 3.80–5.20)
RDW: 14.3 % (ref 11.5–14.5)
WBC: 8.5 10*3/uL (ref 3.6–11.0)

## 2015-10-13 LAB — BLOOD GAS, VENOUS
ACID-BASE EXCESS: 8.4 mmol/L — AB (ref 0.0–3.0)
Bicarbonate: 37.3 mEq/L — ABNORMAL HIGH (ref 21.0–28.0)
PATIENT TEMPERATURE: 37
pCO2, Ven: 74 mmHg (ref 44.0–60.0)
pH, Ven: 7.31 — ABNORMAL LOW (ref 7.320–7.430)

## 2015-10-13 LAB — TROPONIN I
Troponin I: <0.03 ng/mL (ref <0.03)
Troponin I: <0.03 ng/mL (ref <0.03)

## 2015-10-13 LAB — BLOOD GAS, ARTERIAL
ACID-BASE EXCESS: 7.6 mmol/L — AB (ref 0.0–3.0)
BICARBONATE: 35.9 meq/L — AB (ref 21.0–28.0)
Expiratory PAP: 8
FIO2: 0.4
Inspiratory PAP: 16
Mechanical Rate: 12
O2 Saturation: 99.1 %
PATIENT TEMPERATURE: 37
PCO2 ART: 68 mmHg — AB (ref 32.0–48.0)
PH ART: 7.33 — AB (ref 7.350–7.450)
pO2, Arterial: 144 mmHg — ABNORMAL HIGH (ref 83.0–108.0)

## 2015-10-13 LAB — GLUCOSE, CAPILLARY
GLUCOSE-CAPILLARY: 130 mg/dL — AB (ref 65–99)
GLUCOSE-CAPILLARY: 61 mg/dL — AB (ref 65–99)
GLUCOSE-CAPILLARY: 114 mg/dL — AB (ref 65–99)
GLUCOSE-CAPILLARY: 71 mg/dL (ref 65–99)
Glucose-Capillary: 83 mg/dL (ref 65–99)
Glucose-Capillary: 173 mg/dL — ABNORMAL HIGH (ref 65–99)
Glucose-Capillary: 168 mg/dL — ABNORMAL HIGH (ref 65–99)

## 2015-10-13 LAB — PHOSPHORUS: PHOSPHORUS: 4 mg/dL (ref 2.5–4.6)

## 2015-10-13 LAB — MAGNESIUM: MAGNESIUM: 2.1 mg/dL (ref 1.7–2.4)

## 2015-10-13 LAB — LACTIC ACID, PLASMA: Lactic Acid, Venous: 1 mmol/L (ref 0.5–1.9)

## 2015-10-13 LAB — HEMOGLOBIN A1C: Hgb A1c MFr Bld: 7.6 % — ABNORMAL HIGH (ref 4.0–6.0)

## 2015-10-13 LAB — MRSA PCR SCREENING: MRSA BY PCR: NEGATIVE

## 2015-10-13 LAB — PROCALCITONIN: Procalcitonin: <0.1 ng/mL

## 2015-10-13 MED ORDER — DEXTROSE-NACL 5-0.45 % IV SOLN: INTRAVENOUS | Status: DC

## 2015-10-13 MED ORDER — FLUPHENAZINE HCL 5 MG PO TABS
5.0000 mg | ORAL_TABLET | Freq: Every morning | ORAL | Status: DC
  Filled 2015-10-13: qty 1

## 2015-10-13 MED ORDER — ATORVASTATIN CALCIUM 20 MG PO TABS
20.0000 mg | ORAL_TABLET | Freq: Every day | ORAL | Status: DC
  Administered 2015-10-15 – 2015-10-21 (×7): 20 mg via ORAL
  Filled 2015-10-13 (×7): qty 1

## 2015-10-13 MED ORDER — FLUPHENAZINE DECANOATE 25 MG/ML IJ SOLN
25.0000 mg | INTRAMUSCULAR | Status: DC
  Administered 2015-10-13: 25 mg via INTRAMUSCULAR
  Filled 2015-10-13 (×2): qty 1

## 2015-10-13 MED ORDER — FAMOTIDINE IN NACL 20-0.9 MG/50ML-% IV SOLN
20.0000 mg | Freq: Two times a day (BID) | INTRAVENOUS | Status: DC
  Administered 2015-10-13 – 2015-10-17 (×7): 20 mg via INTRAVENOUS
  Filled 2015-10-13 (×9): qty 50

## 2015-10-13 MED ORDER — METOCLOPRAMIDE HCL 5 MG/ML IJ SOLN
5.0000 mg | Freq: Four times a day (QID) | INTRAMUSCULAR | Status: DC | PRN
Start: 2015-10-13 — End: 2015-10-14

## 2015-10-13 MED ORDER — VERAPAMIL HCL 40 MG PO TABS
40.0000 mg | ORAL_TABLET | Freq: Four times a day (QID) | ORAL | Status: DC
  Administered 2015-10-15 – 2015-10-16 (×4): 40 mg via ORAL
  Filled 2015-10-13 (×8): qty 1

## 2015-10-13 MED ORDER — FLUPHENAZINE HCL 2.5 MG/ML IJ SOLN
5.0000 mg | Freq: Three times a day (TID) | INTRAMUSCULAR | Status: DC
  Administered 2015-10-13 – 2015-10-14 (×4): 5 mg via INTRAMUSCULAR
  Filled 2015-10-13 (×6): qty 2

## 2015-10-13 MED ORDER — CARVEDILOL 3.125 MG PO TABS
3.1250 mg | ORAL_TABLET | Freq: Two times a day (BID) | ORAL | Status: DC
  Administered 2015-10-15: 3.125 mg via ORAL
  Filled 2015-10-13 (×2): qty 1

## 2015-10-13 MED ORDER — SODIUM CHLORIDE 0.9 % IV SOLN: 250.0000 mL | INTRAVENOUS | Status: DC | PRN

## 2015-10-13 MED ORDER — ACETAMINOPHEN 325 MG PO TABS: 650.0000 mg | ORAL_TABLET | ORAL | Status: DC | PRN

## 2015-10-13 MED ORDER — LORAZEPAM 2 MG/ML IJ SOLN
2.0000 mg | INTRAMUSCULAR | Status: DC | PRN
  Administered 2015-10-14: 2 mg via INTRAVENOUS
  Filled 2015-10-13 (×2): qty 1

## 2015-10-13 MED ORDER — LORAZEPAM 0.5 MG PO TABS: 0.5000 mg | ORAL_TABLET | Freq: Every day | ORAL | Status: DC

## 2015-10-13 MED ORDER — FUROSEMIDE 10 MG/ML IJ SOLN
20.0000 mg | Freq: Two times a day (BID) | INTRAMUSCULAR | Status: DC
Start: 2015-10-13 — End: 2015-10-16
  Administered 2015-10-13 – 2015-10-16 (×6): 20 mg via INTRAVENOUS
  Filled 2015-10-13 (×6): qty 2

## 2015-10-13 MED ORDER — FUROSEMIDE 8 MG/ML PO SOLN
40.0000 mg | Freq: Once | ORAL | Status: DC
  Filled 2015-10-13: qty 5

## 2015-10-13 MED ORDER — CHLORHEXIDINE GLUCONATE 0.12 % MT SOLN
15.0000 mL | Freq: Two times a day (BID) | OROMUCOSAL | Status: DC
  Administered 2015-10-13 – 2015-10-18 (×9): 15 mL via OROMUCOSAL
  Filled 2015-10-13 (×8): qty 15

## 2015-10-13 MED ORDER — FUROSEMIDE 10 MG/ML IJ SOLN
40.0000 mg | Freq: Once | INTRAMUSCULAR | Status: AC
  Administered 2015-10-13: 40 mg via INTRAVENOUS
  Filled 2015-10-13: qty 4

## 2015-10-13 MED ORDER — ENOXAPARIN SODIUM 40 MG/0.4ML ~~LOC~~ SOLN
40.0000 mg | Freq: Two times a day (BID) | SUBCUTANEOUS | Status: DC
  Administered 2015-10-13 – 2015-10-22 (×19): 40 mg via SUBCUTANEOUS
  Filled 2015-10-13 (×19): qty 0.4

## 2015-10-13 MED ORDER — IPRATROPIUM-ALBUTEROL 0.5-2.5 (3) MG/3ML IN SOLN
3.0000 mL | Freq: Four times a day (QID) | RESPIRATORY_TRACT | Status: DC
  Administered 2015-10-13 – 2015-10-22 (×35): 3 mL via RESPIRATORY_TRACT
  Filled 2015-10-13 (×36): qty 3

## 2015-10-13 MED ORDER — DEXTROSE 50 % IV SOLN
INTRAVENOUS | Status: AC
  Administered 2015-10-13: 50 mL
  Filled 2015-10-13: qty 50

## 2015-10-13 MED ORDER — INSULIN GLARGINE 100 UNIT/ML ~~LOC~~ SOLN
10.0000 [IU] | Freq: Every day | SUBCUTANEOUS | Status: DC
  Filled 2015-10-13: qty 0.1

## 2015-10-13 MED ORDER — LORAZEPAM 2 MG/ML IJ SOLN
INTRAMUSCULAR | Status: AC
  Filled 2015-10-13: qty 2

## 2015-10-13 MED ORDER — BENZTROPINE MESYLATE 1 MG PO TABS: 1.0000 mg | ORAL_TABLET | Freq: Every day | ORAL | Status: DC | PRN

## 2015-10-13 MED ORDER — FLUPHENAZINE HCL 5 MG PO TABS
15.0000 mg | ORAL_TABLET | Freq: Every day | ORAL | Status: DC
  Filled 2015-10-13: qty 3

## 2015-10-13 MED ORDER — HYDRALAZINE HCL 20 MG/ML IJ SOLN
10.0000 mg | INTRAMUSCULAR | Status: DC | PRN
  Administered 2015-10-13 – 2015-10-15 (×6): 20 mg via INTRAVENOUS
  Administered 2015-10-15: 40 mg via INTRAVENOUS
  Administered 2015-10-16: 20 mg via INTRAVENOUS
  Filled 2015-10-13: qty 2
  Filled 2015-10-13: qty 1
  Filled 2015-10-13: qty 2
  Filled 2015-10-13: qty 1
  Filled 2015-10-13: qty 2
  Filled 2015-10-13: qty 1
  Filled 2015-10-13: qty 2
  Filled 2015-10-13: qty 1

## 2015-10-13 MED ORDER — ASPIRIN EC 81 MG PO TBEC
81.0000 mg | DELAYED_RELEASE_TABLET | Freq: Every day | ORAL | Status: DC
  Administered 2015-10-15 – 2015-10-22 (×8): 81 mg via ORAL
  Filled 2015-10-13 (×11): qty 1

## 2015-10-13 MED ORDER — DOCUSATE SODIUM 100 MG PO CAPS
200.0000 mg | ORAL_CAPSULE | Freq: Two times a day (BID) | ORAL | Status: DC
  Administered 2015-10-15: 200 mg via ORAL
  Filled 2015-10-13 (×2): qty 2

## 2015-10-13 MED ORDER — DEXMEDETOMIDINE HCL IN NACL 400 MCG/100ML IV SOLN
0.4000 ug/kg/h | INTRAVENOUS | Status: DC
  Administered 2015-10-13 (×3): 1.2 ug/kg/h via INTRAVENOUS
  Administered 2015-10-14: 1 ug/kg/h via INTRAVENOUS
  Administered 2015-10-14: 0.4 ug/kg/h via INTRAVENOUS
  Administered 2015-10-14: 1.2 ug/kg/h via INTRAVENOUS
  Administered 2015-10-14: 1.1 ug/kg/h via INTRAVENOUS
  Filled 2015-10-13 (×8): qty 100

## 2015-10-13 MED ORDER — VALPROATE SODIUM 500 MG/5ML IV SOLN
500.0000 mg | Freq: Two times a day (BID) | INTRAVENOUS | Status: DC
  Administered 2015-10-13 – 2015-10-15 (×5): 500 mg via INTRAVENOUS
  Filled 2015-10-13 (×6): qty 5

## 2015-10-13 MED ORDER — FLUPHENAZINE HCL 5 MG PO TABS
15.0000 mg | ORAL_TABLET | Freq: Every day | ORAL | Status: DC
  Filled 2015-10-13: qty 1

## 2015-10-13 MED ORDER — MORPHINE SULFATE (PF) 2 MG/ML IV SOLN: 2.0000 mg | INTRAVENOUS | Status: DC | PRN

## 2015-10-13 MED ORDER — LISINOPRIL 20 MG PO TABS
40.0000 mg | ORAL_TABLET | Freq: Every day | ORAL | Status: DC
  Filled 2015-10-13: qty 2

## 2015-10-13 MED ORDER — BUDESONIDE 0.25 MG/2ML IN SUSP
0.2500 mg | Freq: Two times a day (BID) | RESPIRATORY_TRACT | Status: DC
  Administered 2015-10-13 – 2015-10-14 (×3): 0.25 mg via RESPIRATORY_TRACT
  Filled 2015-10-13 (×2): qty 2

## 2015-10-13 MED ORDER — CETYLPYRIDINIUM CHLORIDE 0.05 % MT LIQD
7.0000 mL | Freq: Two times a day (BID) | OROMUCOSAL | Status: DC
  Administered 2015-10-14 – 2015-10-18 (×8): 7 mL via OROMUCOSAL

## 2015-10-13 MED ORDER — INSULIN ASPART 100 UNIT/ML ~~LOC~~ SOLN
0.0000 [IU] | Freq: Four times a day (QID) | SUBCUTANEOUS | Status: DC
  Administered 2015-10-13 – 2015-10-14 (×2): 3 [IU] via SUBCUTANEOUS
  Administered 2015-10-14 – 2015-10-15 (×5): 2 [IU] via SUBCUTANEOUS
  Administered 2015-10-15 – 2015-10-16 (×2): 3 [IU] via SUBCUTANEOUS
  Administered 2015-10-16 (×2): 2 [IU] via SUBCUTANEOUS
  Filled 2015-10-13 (×2): qty 2
  Filled 2015-10-13: qty 3
  Filled 2015-10-13: qty 2
  Filled 2015-10-13: qty 3
  Filled 2015-10-13 (×2): qty 2
  Filled 2015-10-13: qty 3
  Filled 2015-10-13: qty 2
  Filled 2015-10-13: qty 3
  Filled 2015-10-13: qty 2

## 2015-10-13 MED ORDER — SODIUM CHLORIDE 0.9 % IV SOLN
INTRAVENOUS | Status: DC
  Administered 2015-10-15: 10:00:00 via INTRAVENOUS

## 2015-10-13 MED ORDER — FAMOTIDINE 20 MG PO TABS
20.0000 mg | ORAL_TABLET | Freq: Two times a day (BID) | ORAL | Status: DC
  Filled 2015-10-13: qty 1

## 2015-10-13 MED ORDER — BENZTROPINE MESYLATE 1 MG PO TABS: 0.5000 mg | ORAL_TABLET | Freq: Two times a day (BID) | ORAL | Status: DC

## 2015-10-13 MED ORDER — DIVALPROEX SODIUM ER 500 MG PO TB24
1000.0000 mg | ORAL_TABLET | Freq: Every day | ORAL | Status: DC
Start: 2015-10-13 — End: 2015-10-13

## 2015-10-13 MED ORDER — TRAMADOL HCL 50 MG PO TABS
50.0000 mg | ORAL_TABLET | Freq: Four times a day (QID) | ORAL | Status: DC | PRN
  Administered 2015-10-15: 50 mg via ORAL
  Filled 2015-10-13: qty 1

## 2015-10-13 MED ORDER — HEPARIN SODIUM (PORCINE) 5000 UNIT/ML IJ SOLN
5000.0000 [IU] | Freq: Three times a day (TID) | INTRAMUSCULAR | Status: DC
  Administered 2015-10-13: 5000 [IU] via SUBCUTANEOUS
  Filled 2015-10-13: qty 1

## 2015-10-13 MED ORDER — LORAZEPAM 2 MG/ML IJ SOLN
4.0000 mg | Freq: Once | INTRAMUSCULAR | Status: AC
  Administered 2015-10-13: 4 mg via INTRAVENOUS
  Filled 2015-10-13: qty 2

## 2015-10-13 MED ORDER — ONDANSETRON HCL 4 MG/2ML IJ SOLN: 4.0000 mg | Freq: Four times a day (QID) | INTRAMUSCULAR | Status: DC | PRN

## 2015-10-13 MED ORDER — DICYCLOMINE HCL 20 MG PO TABS
20.0000 mg | ORAL_TABLET | Freq: Three times a day (TID) | ORAL | Status: DC | PRN
  Filled 2015-10-13: qty 1

## 2015-10-13 NOTE — ED Notes (Signed)
Pt transferred to toilet; RT setting up bipap

## 2015-10-13 NOTE — ED Notes (Signed)
Called RT for VBG

## 2015-10-13 NOTE — Progress Notes (Signed)
70 y/o F on heparin 5000 units [place] tid for DVT prophylaxis. Due to weight > 100 kg and BMI > 40, will change to Lovenox 40 mg bid as discussed with Dr. Ashby Dawes.   Ulice Dash, PharmD Clinical Pharmacist

## 2015-10-13 NOTE — Care Management (Signed)
This RNCM spoke with Amber at the [facility] assisted living environment-  Patient "is not confused and sometimes even walks without a walker". "There are behavioral issues". She feeds herself. Her PCP is Art gallery manager. She has a walker but doesn't always use it. She has O2 noctural- 2 liters- through [facility]. No Bipap/CPAP at facility.  She is open to [facility]. I have made them aware that patient is in hospital. She is incontinent of bladder. "She's pretty good at asking for help but able to manage herself" per [employer]. She wears TED hose at ALF.

## 2015-10-13 NOTE — Progress Notes (Signed)
Pt removed bipap, refused all medications, began removing monitors and gown. She is very combative, yelling "i am leaving and going to [facility] [place]!" pt refused to calm down or listen. Explained the need for the bipap and medications but pt yelled "it is my right to refuse medication and my right to pick my doctor". This RN called pt son, Priscille Loveless and explained the situation, he said he has a very difficult time caring for her and will be to the hospital later today. pt abruptly removed one peripheral iv.applied pressure to site until bleeding ceased. Consulted icu md and icu np both assessed pt and tried to calm her down and explain risk and benefits of leaving the hospital and refusing meds. Pt still refused to listen and became more combative. Orders placed for intervention and a Air cabin crew.

## 2015-10-13 NOTE — ED Provider Notes (Signed)
-----------------------------------------   12:54 AM on 10/13/2015 -----------------------------------------   Blood pressure (!) 183/81, pulse 83, temperature 98.1 F (36.7 C), temperature source Oral, resp. rate (!) 23, height 5\' 6"  (1.676 m), weight 286 lb (129.7 kg), SpO2 96 %.  Assuming care from Dr. Jacqualine Code.  In short, [name] [name] is a 70 y.o. female with a chief complaint of Shortness of Breath .  Refer to the original H&P for additional details.  The current plan of care is to follow up the results of the blood gas.  The patient's pH is 7.31 and her PCO2 is 74. Given these results it appears as though the patient's somnolence and shortness of breath is due to hypercarbia. We will put the patient on BiPAP and she will be admitted to the intensive care service.   Loney Hering, MD 10/13/15 [phone]

## 2015-10-13 NOTE — Progress Notes (Signed)
Called to bedside as pt had torn off bipap, removed IV's and was yelling that she wanted to get out of the hospital, and wants to be transferred. ABG showed elevated CO2 of 11; compensated with pH of 7.35.  Tried to speak with patient and get her to calm down, she is oriented times 3, but she refuses to take any medications, even her usual outpatient medications, claiming that we are trying to poison her. She has an existing IV, but will not allow RN to access it. She is refusing bipap.  Discussed with nursing staff, as she appears to be hemodynamically stable at this time will get a sitter so see if she may calm down on her own.  If she continues to present a danger to herself or staff by refusing to take usual or new medications, we need to treat her delirium by calling security and giving her a sedative.  We will consult psych to see if there are other option/medications that we could try.   Marda Stalker, M.D.  10/13/2015

## 2015-10-13 NOTE — Progress Notes (Signed)
Pt continues to refuse care, meds and lab draws. md aware.  rn will continue to monitor.

## 2015-10-13 NOTE — H&P (Addendum)
PULMONARY / CRITICAL CARE [name]   Name: [name] [name] MRN: [id number] DOB: [birth year]    ADMISSION DATE:  10/12/2015     DISCUSSION: 70 yo AA female with a h/o COPD, bronchitis, CHF and right renal mass(benign?) presenting with acute on chronic  hypercarbic respiratory failure and uncontrolled hypertension likely due to non-adherence  ASSESSMENT / PLAN:  PULMONARY A: Acute on chronic hypercarbic respiratory failure H/O COPD H/o Bronchitis P:   -Continue BiPAP qhs and prn today.  Keep SPO2>90% -Stat ABG -Daily CXR prn -Nebulized steroids and bronchodilator -Chest x-ray images from 10/13/15, and recent CT images reviewed, consistent with mild pulmonary edema, otherwise unremarkable.  CULTURES: None  ANTIBIOTICS: None   CARDIOVASCULAR A:  Uncontrolled hypertension Lower extremity edema H/o LV outflow obstruction H/O CHF-Last 2 D ECHO WAS IN FEB 2016 CAD P:  -Lasix 40 mg iv x1 given,  Diurese conservatively. -2-D echo -Hemodynamics per ICU protocol -Hydralazine prn to keep SBP<140 -Continue home dose of lisinopril, verapamil and ASA   STUDIES:  2-E ECHO 08/21>   RENAL A:   Right renal mass-last biopsy was 05/15/2015 with results negative for malignancy P:   -F/U with Dr [name] [name] at [place] creatinine -Monitor and correct electrolyte imbalances  GASTROINTESTINAL A:   H/O GERD P:   -Keep NPO while on BiPAP -Famotidine 20mg  bid  HEMATOLOGIC A:   No acute issues P:  -Heparin for VTE prophylaxis  ENDOCRINE A:   Type 2 DM   P:   -Lantus 10 units QHS -Blood glucose monitoring with sliding scale insulin coverage  NEUROLOGIC A:   Acute encephalopathy likely due to hypercarbia P:   RASS goal: n/a -BiPAP and hold antipsychotics until mental status is at baseline   SIGNIFICANT EVENTS: 08/20>ED with acute dyspnea  LINES/TUBES: PIVs -------------------------------------------------------  REFERRING MD: Dr. Dahlia [name]  CHIEF COMPLAINT:  "I couldn't breath"  HISTORY OF PRESENT ILLNESS:   This is a 70 y/o AA female with a significant medical and psychiatric history who presents with acute shortness of breath. History is limited as patient is somnolent on BiPAP. Patient reports that symptoms started a few days ago and progressively got worse. Per ED records, patient was very short of breath earlier today. She reports chest pain prior to ED arrival but states that the chest pain has resolved.  She is on home O2 and multiple inhalers at home but it is unclear if patient has been complaint with prescribed regimen. She was in the ED  in July with similar symptoms and was treated and released.  Her VBG shows hypercarbia and her CXR is unremarkable. She is hypertensive with SBP in the 200s. Right renal masson CXR; mass biopsied on 05/15/14 at [facility] and pathology showed no malignancy. She is suppose to f/u with Dr. Barbie [name] q6 months. Last echo was in 04/09/2014 with an EF of 60%, and LVH.   PAST MEDICAL HISTORY :  She  has a past medical history of Anxiety; Arthritis; Bronchitis; Bursitis; CAD (coronary artery disease); CHF (congestive heart failure) ([place]); Chronic cough; Chronic kidney disease; COPD (chronic obstructive pulmonary disease) ([place]); Depression; DM2 (diabetes mellitus, type 2) ([place]); Environmental allergies; GERD (gastroesophageal reflux disease); Hypercholesteremia; Hypertension; Left ventricular outflow tract obstruction; Lower extremity edema; LVH (left ventricular hypertrophy); Muscle weakness; Obesity; On supplemental oxygen therapy; OSA (obstructive sleep apnea); Osteoarthritis; Schizophrenia ([place]); Tremors of nervous system; and Wheezing.  PAST SURGICAL HISTORY: She  has a past surgical history that includes Tonsillectomy; Knee reconstruction, medial patellar femoral ligament; Cholecystectomy; Colonoscopy (04/2011);  Tonsillectomy; Cataract extraction w/PHACO (Left, 09/10/2014); Joint replacement; Eye surgery; Cataract extraction  w/PHACO (Right, 10/15/2014); and Renal biopsy.  Allergies  Allergen Reactions  . Haldol [Haloperidol Decanoate] Swelling and Other (See Comments)    Reaction:  Swelling of tongue and blurred vision    . Metformin Diarrhea  . Prednisone Other (See Comments)    Unknown reaction  . Raspberry Swelling and Other (See Comments)    Reaction:  Swelling of lips     No current facility-administered medications on file prior to encounter.    Current Outpatient Prescriptions on File Prior to Encounter  Medication Sig  . albuterol (PROVENTIL HFA;VENTOLIN HFA) 108 (90 Base) MCG/ACT inhaler Inhale 2 puffs into the lungs every 6 (six) hours as needed for wheezing or shortness of breath.  Marland Kitchen aspirin EC 81 MG tablet Take 81 mg by mouth daily.  Marland Kitchen atorvastatin (LIPITOR) 20 MG tablet Take 20 mg by mouth at bedtime.  . benztropine (COGENTIN) 0.5 MG tablet Take 0.5 mg by mouth 2 (two) times daily. *Note Dose*  . carvedilol (COREG) 3.125 MG tablet Take 1 tablet (3.125 mg total) by mouth 2 (two) times daily.  Marland Kitchen dicyclomine (BENTYL) 20 MG tablet Take 1 tablet (20 mg total) by mouth 3 (three) times daily as needed for spasms.  . divalproex (DEPAKOTE ER) 500 MG 24 hr tablet Take 1,000 mg by mouth at bedtime.   . docusate sodium (COLACE) 100 MG capsule Take 2 capsules (200 mg total) by mouth 2 (two) times daily.  . fluPHENAZine (PROLIXIN) 5 MG tablet Take 5-15 mg by mouth See admin instructions. Take 5 mg by mouth every morning and Take 3 tablets (15 mg) by mouth every night at bedtime.  . fluPHENAZine decanoate (PROLIXIN) 25 MG/ML injection Inject 1 mL (25 mg total) into the muscle every 14 (fourteen) days.  . Fluticasone-Salmeterol (ADVAIR) 250-50 MCG/DOSE AEPB Inhale 1 puff into the lungs 2 (two) times daily.  Marland Kitchen glipiZIDE (GLUCOTROL) 10 MG tablet Take 10 mg by mouth 2 (two) times daily.  . insulin glargine (LANTUS) 100 UNIT/ML injection Inject 0.15 mLs (15 Units total) into the skin at bedtime. (Patient taking  differently: Inject 18 Units into the skin at bedtime. )  . lisinopril (PRINIVIL,ZESTRIL) 40 MG tablet Take 1 tablet (40 mg total) by mouth daily.  Marland Kitchen LORazepam (ATIVAN) 0.5 MG tablet Take 0.5 mg by mouth at bedtime. Pt is also able to take an additional tablet daily if needed for anxiety.  . meloxicam (MOBIC) 15 MG tablet Take 15 mg by mouth daily.  . metoCLOPramide (REGLAN) 10 MG tablet Take 1 tablet (10 mg total) by mouth every 6 (six) hours as needed for nausea or vomiting.  . ranitidine (ZANTAC) 150 MG tablet Take 150 mg by mouth 2 (two) times daily.  . sitaGLIPtin (JANUVIA) 100 MG tablet Take 100 mg by mouth every evening.   . tiotropium (SPIRIVA) 18 MCG inhalation capsule Place 1 capsule (18 mcg total) into inhaler and inhale daily.  . traMADol (ULTRAM) 50 MG tablet Take 50 mg by mouth every 6 (six) hours as needed for moderate pain.   . verapamil (CALAN) 40 MG tablet Take 40 mg by mouth every 6 (six) hours.    FAMILY HISTORY:  Her indicated that her mother is deceased. She indicated that her father is deceased. She indicated that the status of her neg hx is unknown.    SOCIAL HISTORY: She  reports that she has quit smoking. She has a 120.00 pack-year smoking history.  She has never used smokeless tobacco. She reports that she does not drink alcohol or use drugs.  REVIEW OF SYSTEMS:   Unable to obtain as patient is on BiPAP and somnolent  SUBJECTIVE:   VITAL SIGNS: BP (!) 183/81 (BP Location: Right Arm)   Pulse 83   Temp 98.1 F (36.7 C) (Oral)   Resp (!) 23   Ht 5\' 6"  (1.676 m)   Wt 286 lb (129.7 kg)   SpO2 98%   BMI 46.16 kg/m   HEMODYNAMICS:   VENTILATOR SETTINGS: FiO2 (%):  [40 %] 40 %  INTAKE / OUTPUT: No intake/output data recorded.  PHYSICAL EXAMINATION: General: Chronically ill-looking, mild respiratory distress Neuro: Awakens to voice and touch, follows basic commands but falls asleep immediately, grips equal bilaterally, moves all extremities HEENT:   PERRLA, Oral mucosa dry, neck short, trachea midline Cardiovascular: RRR, S1/S2, no MRG Lungs: Mild increase in WOB, bilateral breath sounds diminished in all lung fields, no wheezing, rales or rhonchi Abdomen: Obese, normal bowel sounds, no palpable organomegally Musculoskeletal: +rom in BLUE/BLLE Extremities: +2 edema, +2 pulses Skin: Warm, no rash or lesions  LABS:  BMET  Recent Labs Lab 10/12/15 2032  NA 142  K 4.5  CL 102  CO2 35*  BUN 16  CREATININE 0.82  GLUCOSE 143*    Electrolytes  Recent Labs Lab 10/12/15 2032  CALCIUM 8.8*    CBC  Recent Labs Lab 10/12/15 2032  WBC 9.9  HGB 13.3  HCT 40.2  PLT 210    Coag's No results for input(s): APTT, INR in the last 168 hours.  Sepsis Markers No results for input(s): LATICACIDVEN, PROCALCITON, O2SATVEN in the last 168 hours.  ABG No results for input(s): PHART, PCO2ART, PO2ART in the last 168 hours.  Liver Enzymes  Recent Labs Lab 10/12/15 2032  AST 21  ALT 16  ALKPHOS 73  BILITOT 0.2*  ALBUMIN 3.7    Cardiac Enzymes  Recent Labs Lab 10/12/15 2032  TROPONINI <0.03    Glucose No results for input(s): GLUCAP in the last 168 hours.  Imaging Dg Chest 2 View  Result Date: 10/12/2015 CLINICAL DATA:  Shortness of breath for a few days. On home oxygen. History of bronchitis, congestive heart failure, diabetes and chronic kidney disease. EXAM: CHEST  2 VIEW COMPARISON:  CT and radiographs 09/18/2015. FINDINGS: Stable mild cardiomegaly. The overall pulmonary aeration has improved. There is no confluent airspace opacity, pleural effusion or pneumothorax. No acute osseous findings are demonstrated. There are glenohumeral degenerative changes bilaterally. IMPRESSION: No active cardiopulmonary process. Please note finding of enlarging solid mass involving the right kidney on chest CT from last month, worrisome for renal cell carcinoma. Electronically Signed   By: Richardean Sale M.D.   On: 10/12/2015  20:40     Disposition and family update: No family at bedside. Will update when available. Further changes in treatment plan pending clinical course and diagnostics   [name] [name] ANP-BC Pulmonary and [place] Pager [phone] or [phone]  10/13/2015, 2:41 AM   70 yo AA female with a h/o COPD, bronchitis, CHF and right renal mass(benign?) presenting with acute on chronic  hypercarbic respiratory failure and uncontrolled hypertension likely due to non-adherence; pt awake and alert, diurese conservatively today, wean down/off bipap.   -Marda Stalker, M.D.   Critical Care Attestation.  I have personally obtained a history, examined the patient, evaluated laboratory and imaging results, formulated the assessment and plan and placed orders. The Patient requires high  complexity decision making for assessment and support, frequent evaluation and titration of therapies, application of advanced monitoring technologies and extensive interpretation of multiple databases. The patient has critical illness that could lead imminently to failure of 1 or more organ systems and requires the highest level of physician preparedness to intervene.  Critical Care Time devoted to patient care services described in this note is 35 minutes and is exclusive of time spent in procedures.

## 2015-10-13 NOTE — Consult Note (Addendum)
  [name] [name] is well known to [place]. She has a history of schizophrenia or schizoaffective disroder and does well on a combination of Depakote and Prolixin. The patient was agitated and not cooperative with treatment at ICU today. By the time I saw her, she was given Precedex and was asleep. I spoke with her nurse. The patient initially responded to Ativan but she refused all oral psychotropic medications. She gets iv valproic acid every 12 hours. She was given 25 mg of Prolixin decanoate today.   PLAN: 1. I will see [name] [name] tomorrow morning.   2. Please continue Depakte.  3. I will hold oral Prolixin and give im Prolixin 5 mg every 8 hours in addition to long lasting Prolixin Decanoate that is given evey two weeks.

## 2015-10-13 NOTE — Progress Notes (Signed)
Chaplain rounded the unit to provide a compassionate presence and support to the patient.  The 69 patient appeared to be sleeping.  Silent prayer was said.  Minerva Fester [phone]

## 2015-10-13 NOTE — Progress Notes (Signed)
   10/13/15 1400  Clinical Encounter Type  Visited With Patient  Visit Type Follow-up  Referral From Nurse  Consult/Referral To Chaplain  Spiritual Encounters  Spiritual Needs Emotional  Stress Factors  Patient Stress Factors Exhausted;Health changes;Loss of control  Visited w/patient to provide emotional support and stability. Discussed her concerns that she is under the care of a physician at [facility] who has monitored the spot on her lung over the past 3 years. I have provided pastoral care to [facility] for th past 16 months and helped her to regain control of her anxieties on numerous occassions in the ER. Diamone possesses a strong spiritual need and finds solace in prayer and Holy Scripture (especially Psalms). She appeared calm and content with the knowledge that she has an appointment for Monday with her physician at [facility]. 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 She becomes paranoid when she feels that she is not told the whole truth. She becomes agitated when she feels undervalued.  Chap. Olinda Nola G. [place]

## 2015-10-14 ENCOUNTER — Inpatient Hospital Stay: Payer: Medicare HMO

## 2015-10-14 LAB — CBC
HCT: 41.2 % (ref 35.0–47.0)
HEMOGLOBIN: 13.6 g/dL (ref 12.0–16.0)
MCH: 28.6 pg (ref 26.0–34.0)
MCHC: 33 g/dL (ref 32.0–36.0)
MCV: 86.7 fL (ref 80.0–100.0)
Platelets: 205 10*3/uL (ref 150–440)
RBC: 4.75 MIL/uL (ref 3.80–5.20)
RDW: 14.5 % (ref 11.5–14.5)
WBC: 8.7 10*3/uL (ref 3.6–11.0)

## 2015-10-14 LAB — BLOOD GAS, ARTERIAL
Acid-Base Excess: 12.8 mmol/L — ABNORMAL HIGH (ref 0.0–3.0)
Allens test (pass/fail): POSITIVE — AB
BICARBONATE: 39.8 meq/L — AB (ref 21.0–28.0)
FIO2: 0.32
O2 Saturation: 94.3 %
PATIENT TEMPERATURE: 37
PO2 ART: 70 mmHg — AB (ref 83.0–108.0)
pCO2 arterial: 60 mmHg — ABNORMAL HIGH (ref 32.0–48.0)
pH, Arterial: 7.43 (ref 7.350–7.450)

## 2015-10-14 LAB — BASIC METABOLIC PANEL
Anion gap: 6 (ref 5–15)
BUN: 13 mg/dL (ref 6–20)
CALCIUM: 9.1 mg/dL (ref 8.9–10.3)
CO2: 33 mmol/L — ABNORMAL HIGH (ref 22–32)
CREATININE: 0.67 mg/dL (ref 0.44–1.00)
Chloride: 105 mmol/L (ref 101–111)
GFR calc non Af Amer: >60 mL/min (ref >60)
Glucose, Bld: 176 mg/dL — ABNORMAL HIGH (ref 65–99)
Potassium: 4.1 mmol/L (ref 3.5–5.1)
SODIUM: 144 mmol/L (ref 135–145)

## 2015-10-14 LAB — GLUCOSE, CAPILLARY
Glucose-Capillary: 188 mg/dL — ABNORMAL HIGH (ref 65–99)
Glucose-Capillary: 147 mg/dL — ABNORMAL HIGH (ref 65–99)
Glucose-Capillary: 133 mg/dL — ABNORMAL HIGH (ref 65–99)
Glucose-Capillary: 123 mg/dL — ABNORMAL HIGH (ref 65–99)

## 2015-10-14 MED ORDER — ENALAPRILAT 1.25 MG/ML IV SOLN
0.6250 mg | Freq: Three times a day (TID) | INTRAVENOUS | Status: DC
  Administered 2015-10-14 – 2015-10-17 (×9): 0.625 mg via INTRAVENOUS
  Filled 2015-10-14 (×2): qty 2
  Filled 2015-10-14 (×2): qty 0.5
  Filled 2015-10-14: qty 2
  Filled 2015-10-14: qty 0.5
  Filled 2015-10-14 (×2): qty 2
  Filled 2015-10-14 (×2): qty 0.5
  Filled 2015-10-14: qty 2

## 2015-10-14 MED ORDER — LORAZEPAM 2 MG/ML IJ SOLN
1.0000 mg | INTRAMUSCULAR | Status: DC | PRN
  Administered 2015-10-14 – 2015-10-15 (×3): 1 mg via INTRAVENOUS
  Filled 2015-10-14 (×4): qty 1

## 2015-10-14 NOTE — Progress Notes (Signed)
   10/14/15 2000  Clinical Encounter Type  Visited With Patient  Visit Type Follow-up  Referral From Nurse  Consult/Referral To Chaplain  Spiritual Encounters  Spiritual Needs Emotional  Stress Factors  Patient Stress Factors Exhausted;Health changes  Sat w/Kaytlen who had become anxious & agitated. She continues to express her desire to be seen by her physician at [facility]. Initially, she recognized me from our many previous visits. She requested prayer and readings from [facility], which is common to our visits. However, she began stating that something was covering her face and pulled her oxygen away from her nose. She calmed when I told her that I would leave tonight and she her again tomorrow morning.  Chap. Joeziah Voit G. [place]

## 2015-10-14 NOTE — Significant Event (Signed)
Pt's son, [name] requested an update. He asked whether his mother would "wake up". I reviewed his prior conversation with Dr Ashby Dawes and we again clarified that her comfort and dignity are our highest priorities. He expressed a strong desire that his mother not receive morphine stating that "she is not in pain". I explained that morphine is also effective at controlling the symptom of breathlessness. He requested the opportunity to "research" this before she gets any more morphine. I have discontinued morphine for now. If she develops intractable dyspnea, would suggest revisiting this issue or consider using fentanyl  Merton Border, MD PCCM service Mobile [phone] Pager [phone] 10/14/2015

## 2015-10-14 NOTE — Care Management (Addendum)
[name] [name] [phone]    He is HCPOA.  He is trying to get her out of the [place] through the [facility]- [name] [phone] is working on another care facility. (I have notified CSW). We discussed her Full Code status. I explained difference and he states he understands without any questions. He states his mother's behaviors have become worse as she gets older. He wants her to be DNR.  He states that she is supposed to be 24/7 O2 but "won't wear it". I have notified ICU to have RN or MD call son [name] regarding his decision for DNR.

## 2015-10-14 NOTE — Progress Notes (Signed)
Patient is increasingly agitated and screaming. Has become combative with RN and NA at bedside, attempting hit staff. PRN ativan given an hour ago with no relief. Patient is un consolable at this time. Will restart precedex drip per order. Will monitor accordingly.

## 2015-10-14 NOTE — Progress Notes (Signed)
[name] [name] was weaned off Precedex drip at 0900 this morning. 2 mg of Ativan given once for agitation. Pt was refusing to wear bipap earlier so was placed on 3L nasal cannula. Pt is now drowsy and is hard to arouse. Bipap is currently on. Dr. Ashby Dawes aware. Will continue to monitor closely. Ordered to hold off on giviing any sedative medications for now.

## 2015-10-14 NOTE — Progress Notes (Signed)
[place] Critical Care Medicine Progess Note    ASSESSMENT/PLAN    DISCUSSION: 70 yo AA female with a h/o COPD, bronchitis, CHF and right renal mass(benign?) presenting with acute on chronic  hypercarbic respiratory failure and uncontrolled hypertension likely due to non-adherence  ASSESSMENT / PLAN:  NEUROLOGIC/Psych A:   Delirium/confusion/aggression Acute encephalopathy likely due to hypercarbia improved.  P:   -Wean off precedex.  Precedex qhs.  Ativan prn.  Psych consulted, continue medications per recs.  -restart home antipsychotics when pt amenable.   PULMONARY A: Acute on chronic hypercarbic respiratory failure. Pt is non-compliant with bipap use.  H/O COPD H/o Bronchitis P:   -Continue BiPAP qhs as tolerated.  Keep SPO2>90% -ABG on 8/22 7.43/60/70/39.8; consistent with compensated hypercapnic respiratory failure. -Chest x-ray 10/14/15 is reviewed; no evidence of pneumonia, slight pulmonary edema, increased from yesterday's film. -Nebulized steroids and bronchodilator -Lasix twice a day.   CARDIOVASCULAR A:  Uncontrolled hypertension Lower extremity edema H/o LV outflow obstruction H/O CHF-Last 2 D ECHO WAS IN FEB 2016 CAD P:  --Diurese conservatively. -2-D echo pending.  -Hemodynamics per ICU protocol -Hydralazine prn to keep SBP<140 -Continue home dose of lisinopril, verapamil and ASA   STUDIES:  2-E ECHO 08/21>   RENAL A:   Right renal mass-last biopsy was 05/15/2015 with results negative for malignancy P:   -F/U with Dr Barbie Banner at [place] creatinine -Monitor and correct electrolyte imbalances  GASTROINTESTINAL A:   H/O GERD P:   -Advance diet.  -Famotidine 20mg  bid  HEMATOLOGIC A:   No acute issues P:  -Heparin for VTE prophylaxis  ENDOCRINE A:   Type 2 DM   P:   -Lantus 10 units QHS -Blood glucose monitoring with sliding scale insulin coverage     SIGNIFICANT EVENTS: 08/20>ED with acute  dyspnea 8/21> delirium, placed on precedex, psych consulted.   ---------------------------------------   ----------------------------------------   Name: [name] [name] MRN: [id number] DOB: 04/21/1945    ADMISSION DATE:  10/12/2015    SUBJECTIVE:   Pt currently confused/sedated can not provide history or review of systems.   Review of Systems:  --   VITAL SIGNS: Temp:  [97 F (36.1 C)-98.2 F (36.8 C)] 98 F (36.7 C) (08/22 0700) Pulse Rate:  [55-103] 60 (08/22 0800) Resp:  [10-32] 21 (08/22 0800) BP: (99-193)/(55-152) 168/95 (08/22 0800) SpO2:  [94 %-100 %] 96 % (08/22 0800) FiO2 (%):  [24 %-30 %] 28 % (08/22 0832) Weight:  [285 lb 7.9 oz (129.5 kg)] 285 lb 7.9 oz (129.5 kg) (08/22 0500) HEMODYNAMICS:   VENTILATOR SETTINGS: FiO2 (%):  [24 %-30 %] 28 % INTAKE / OUTPUT:  Intake/Output Summary (Last 24 hours) at 10/14/15 0925 Last data filed at 10/14/15 0600  Gross per 24 hour  Intake           684.55 ml  Output             2635 ml  Net         -1950.45 ml    PHYSICAL EXAMINATION: Physical Examination:   VS: BP (!) 168/95   Pulse 60   Temp 98 F (36.7 C) (Axillary)   Resp (!) 21   Ht 5\' 7"  (1.702 m)   Wt 285 lb 7.9 oz (129.5 kg)   SpO2 96%   BMI 44.71 kg/m   General Appearance: No distress  Neuro:without focal findings, mental status reduced HEENT: PERRLA, EOM intact. Pulmonary: normal breath sounds   CardiovascularNormal S1,S2.  No m/r/g.   Abdomen:  Benign, Soft, non-tender. Renal:  No costovertebral tenderness  GU:  Not performed at this time. Endocrine: No evident thyromegaly. Skin:   warm, no rashes, no ecchymosis  Extremities: normal, no cyanosis, clubbing.   LABS:   LABORATORY PANEL:   CBC  Recent Labs Lab 10/14/15 0425  WBC 8.7  HGB 13.6  HCT 41.2  PLT 205    Chemistries   Recent Labs Lab 10/12/15 2032 10/13/15 0537 10/14/15 0425  NA 142 142 144  K 4.5 4.9 4.1  CL 102 102 105  CO2 35* 35* 33*  GLUCOSE 143* 115*  176*  BUN 16 13 13   CREATININE 0.82 0.74 0.67  CALCIUM 8.8* 8.9 9.1  MG  --  2.1  --   PHOS  --  4.0  --   AST 21  --   --   ALT 16  --   --   ALKPHOS 73  --   --   BILITOT 0.2*  --   --      Recent Labs Lab 10/13/15 0856 10/13/15 1133 10/13/15 1654 10/13/15 2023 10/14/15 0035 10/14/15 0614  GLUCAP 71 83 173* 168* 188* 147*    Recent Labs Lab 10/13/15 0250 10/13/15 1140 10/14/15 0526  PHART 7.33* 7.35 7.43  PCO2ART 68* 71* 60*  PO2ART 144* 70* 70*    Recent Labs Lab 10/12/15 2032  AST 21  ALT 16  ALKPHOS 73  BILITOT 0.2*  ALBUMIN 3.7    Cardiac Enzymes  Recent Labs Lab 10/13/15 1721  TROPONINI <0.03    RADIOLOGY:  Dg Chest 1 View  Result Date: 10/14/2015 CLINICAL DATA:  70 year old female with shortness of breath. Initial encounter. EXAM: CHEST 1 VIEW COMPARISON:  10/13/2015 and earlier. FINDINGS: Portable AP semi upright view at 0606 hours. Cardiac size is accentuated by portable technique. Mild cardiomegaly and mediastinal contours are stable. Stable somewhat low lung volumes. No pneumothorax, pleural effusion or consolidation. Stable pulmonary vascularity without overt edema. IMPRESSION: Stable ventilation with somewhat low lung volumes, vascular congestion without overt edema, and no focal cardiopulmonary abnormality. Electronically Signed   By: Genevie Ann M.D.   On: 10/14/2015 07:30   Dg Chest 2 View  Result Date: 10/12/2015 CLINICAL DATA:  Shortness of breath for a few days. On home oxygen. History of bronchitis, congestive heart failure, diabetes and chronic kidney disease. EXAM: CHEST  2 VIEW COMPARISON:  CT and radiographs 09/18/2015. FINDINGS: Stable mild cardiomegaly. The overall pulmonary aeration has improved. There is no confluent airspace opacity, pleural effusion or pneumothorax. No acute osseous findings are demonstrated. There are glenohumeral degenerative changes bilaterally. IMPRESSION: No active cardiopulmonary process. Please note finding  of enlarging solid mass involving the right kidney on chest CT from last month, worrisome for renal cell carcinoma. Electronically Signed   By: Richardean Sale M.D.   On: 10/12/2015 20:40   Dg Chest Port 1 View  Result Date: 10/13/2015 CLINICAL DATA:  Respiratory failure. EXAM: PORTABLE CHEST 1 VIEW COMPARISON:  10/12/2015. FINDINGS: Mediastinum and hilar structures normal. Mild cardiomegaly. Mild left base atelectasis and or infiltrate. No pleural effusion or pneumothorax. No acute bony abnormality. IMPRESSION: 1. Mild left base atelectasis and or infiltrate. 2. Stable mild cardiomegaly . Electronically Signed   By: Marcello Moores  Register   On: 10/13/2015 07:10       --Marda Stalker, MD.  ICU Pager: [phone] [place] [facility] Office Number: [phone]  Patricia Pesa, M.D.  Vilinda Boehringer, M.D.  Merton Border, M.D  10/14/2015   Critical  Care Attestation.  I have personally obtained a history, examined the patient, evaluated laboratory and imaging results, formulated the assessment and plan and placed orders. The Patient requires high complexity decision making for assessment and support, frequent evaluation and titration of therapies, application of advanced monitoring technologies and extensive interpretation of multiple databases. The patient has critical illness that could lead imminently to failure of 1 or more organ systems and requires the highest level of physician preparedness to intervene.  Critical Care Time devoted to patient care services described in this note is 35 minutes and is exclusive of time spent in procedures.

## 2015-10-14 NOTE — Plan of Care (Signed)
Problem: Fluid Volume: Goal: Ability to maintain a balanced intake and output will improve Outcome: Not Progressing Pt remains NPO

## 2015-10-14 NOTE — Progress Notes (Signed)
Advanced Care Planning Note Pt's condition discussed with the patient's sons separately, [name] and [name]. Case also discussed with the patient's social worker and critical care RN. The patient's condition is very complicated, and she has severe agitation and delirium, and this makes it difficult to allow [place] to care for her, she often require sedation, which in turn significantly reduces her respiration and places her at risk of further respiratory deterioration. The patient frequently refuses BiPAP, which is really the only treatment for her current respiratory issues, though she will not wear this, I explained that her respiratory issues. Will continue to recur and they may eventually end up and her being intubated. I also explained that if the patient were to be intubated. He would be very difficult to get her off the ventilator and she may require a chronic tracheostomy or ventilator support. They were in agreement that the patient would not want this and this would not be in her best interest, nor would it provide quality of life. Therefore, decision was made to make the patient DO NOT RESUSCITATE. 30 min spent in ACP activities.   Marda Stalker, M.D.  10/14/2015

## 2015-10-14 NOTE — Clinical Social Work Note (Signed)
Clinical Social Work Assessment  Patient Details  Name: [name] [name] MRN: [id number] Date of Birth: 1945-08-05  Date of referral:  10/14/15               Reason for consult:  Facility Placement                Permission sought to share information with:    Permission granted to share information::     Name::        Agency::     Relationship::     Contact Information:     Housing/Transportation Living arrangements for the past 2 months:  [place] of Information:  Adult Children Patient Interpreter Needed:  None Criminal Activity/Legal Involvement Pertinent to Current Situation/Hospitalization:  No - Comment as needed Significant Relationships:  Adult Children Lives with:  Facility Resident Do you feel safe going back to the place where you live?    Need for family participation in patient care:     Care giving concerns:  Patient is a resident at [facility].   Social Worker assessment / plan:  CSW spoke with patient's son this afternoon: [name] [name]. [name] [name] stated that he plans for patient to return to [facility] when time but that her ACT team rep has been working on other placement options. [name] [name] stated that his mother has been at [facility] for under a year. CSW contacted [name] [name] with the ACT team and she did confirm that they have found options for patient but will not transition her until after she has returned to [facility]. CSW contacted [name] [name] at [facility] and she stated that they would have to evaluate patient closer to discharge for appropriateness to return. She stated the only issue would be if she required a higher level of care.  Employment status:  Disabled (Comment on whether or not currently receiving Disability) Insurance information:  Managed Medicare PT Recommendations:    Information / Referral to community resources:     Patient/Family's Response to care:  Patient expressed appreciation for CSW assistance.  Patient/Family's Understanding of and  Emotional Response to Diagnosis, Current Treatment, and Prognosis:  Unable to assess patient due to periods of confusion/agitation.  Emotional Assessment Appearance:  Appears stated age Attitude/Demeanor/Rapport:    Affect (typically observed):    Orientation:    Alcohol / Substance use:    Psych involvement (Current and /or in the community):  Yes (Comment)  Discharge Needs  Concerns to be addressed:  Care Coordination Readmission within the last 30 days:  No Current discharge risk:  None Barriers to Discharge:  No Barriers Identified   Shela Leff, LCSW 10/14/2015, 2:19 PM

## 2015-10-15 DIAGNOSIS — F250 Schizoaffective disorder, bipolar type: Secondary | ICD-10-CM

## 2015-10-15 LAB — BASIC METABOLIC PANEL WITH GFR
Anion gap: 7 (ref 5–15)
BUN: 20 mg/dL (ref 6–20)
CO2: 34 mmol/L — ABNORMAL HIGH (ref 22–32)
Calcium: 9.1 mg/dL (ref 8.9–10.3)
Chloride: 106 mmol/L (ref 101–111)
Creatinine, Ser: 0.85 mg/dL (ref 0.44–1.00)
GFR calc Af Amer: >60 mL/min
GFR calc non Af Amer: >60 mL/min
Glucose, Bld: 137 mg/dL — ABNORMAL HIGH (ref 65–99)
Potassium: 4.2 mmol/L (ref 3.5–5.1)
Sodium: 147 mmol/L — ABNORMAL HIGH (ref 135–145)

## 2015-10-15 LAB — CBC
HEMATOCRIT: 43.4 % (ref 35.0–47.0)
HEMOGLOBIN: 14.2 g/dL (ref 12.0–16.0)
MCH: 28.4 pg (ref 26.0–34.0)
MCHC: 32.7 g/dL (ref 32.0–36.0)
MCV: 86.7 fL (ref 80.0–100.0)
Platelets: 202 10*3/uL (ref 150–440)
RBC: 5.01 MIL/uL (ref 3.80–5.20)
RDW: 14 % (ref 11.5–14.5)
WBC: 11.8 10*3/uL — ABNORMAL HIGH (ref 3.6–11.0)

## 2015-10-15 LAB — GLUCOSE, CAPILLARY
Glucose-Capillary: 118 mg/dL — ABNORMAL HIGH (ref 65–99)
Glucose-Capillary: 135 mg/dL — ABNORMAL HIGH (ref 65–99)
Glucose-Capillary: 145 mg/dL — ABNORMAL HIGH (ref 65–99)
Glucose-Capillary: 154 mg/dL — ABNORMAL HIGH (ref 65–99)

## 2015-10-15 LAB — MAGNESIUM: Magnesium: 2 mg/dL (ref 1.7–2.4)

## 2015-10-15 MED ORDER — DIVALPROEX SODIUM 500 MG PO DR TAB
500.0000 mg | DELAYED_RELEASE_TABLET | Freq: Two times a day (BID) | ORAL | Status: DC
  Administered 2015-10-15 – 2015-10-22 (×14): 500 mg via ORAL
  Filled 2015-10-15 (×3): qty 1
  Filled 2015-10-15: qty 2
  Filled 2015-10-15 (×2): qty 1
  Filled 2015-10-15: qty 2
  Filled 2015-10-15 (×9): qty 1

## 2015-10-15 MED ORDER — FLUPHENAZINE HCL 5 MG PO TABS
5.0000 mg | ORAL_TABLET | Freq: Every day | ORAL | Status: DC
  Administered 2015-10-15 – 2015-10-22 (×8): 5 mg via ORAL
  Filled 2015-10-15 (×7): qty 1

## 2015-10-15 MED ORDER — CARVEDILOL 6.25 MG PO TABS
6.2500 mg | ORAL_TABLET | Freq: Two times a day (BID) | ORAL | Status: DC
  Administered 2015-10-15: 6.25 mg via ORAL
  Filled 2015-10-15: qty 1

## 2015-10-15 MED ORDER — FLUPHENAZINE HCL 5 MG PO TABS
15.0000 mg | ORAL_TABLET | Freq: Every day | ORAL | Status: DC
  Administered 2015-10-15: 15 mg via ORAL
  Filled 2015-10-15: qty 3
  Filled 2015-10-15: qty 1

## 2015-10-15 MED ORDER — FLUPHENAZINE HCL 5 MG PO TABS: 2.5000 mg | ORAL_TABLET | Freq: Three times a day (TID) | ORAL | Status: DC

## 2015-10-15 MED ORDER — METOPROLOL TARTRATE 5 MG/5ML IV SOLN
2.5000 mg | INTRAVENOUS | Status: DC | PRN
  Administered 2015-10-15 – 2015-10-16 (×2): 5 mg via INTRAVENOUS
  Filled 2015-10-15 (×2): qty 5

## 2015-10-15 MED ORDER — SENNOSIDES-DOCUSATE SODIUM 8.6-50 MG PO TABS
2.0000 | ORAL_TABLET | Freq: Two times a day (BID) | ORAL | Status: DC
  Administered 2015-10-15 – 2015-10-22 (×14): 2 via ORAL
  Filled 2015-10-15 (×15): qty 2

## 2015-10-15 NOTE — Progress Notes (Signed)
PT Cancellation Note  Patient Details Name: [name] [name] MRN: [id number] DOB: Aug 28, 1945   Cancelled Treatment:    Reason Eval/Treat Not Completed: Medical issues which prohibited therapy (pulses 125-135).  Will check later as time and pt allow.   Ramond Dial 10/15/2015, 11:13 AM    Mee Hives, PT MS Acute Rehab Dept. Number: [place] and [place]

## 2015-10-15 NOTE — Progress Notes (Signed)
Accepted transfer from Dr Ashby Dawes. [facility] will cross-cover this patient until 7 AM tomorrow. Hospitalist will resume care 8/24 starting at 7 am.

## 2015-10-15 NOTE — Progress Notes (Signed)
Per Ashby Dawes order PT and a diet for patient

## 2015-10-15 NOTE — Plan of Care (Signed)
Problem: Education: Goal: Knowledge of [facility] Education information/materials will improve Outcome: Completed/Met Date Met: 10/15/15 POA

## 2015-10-15 NOTE — Consult Note (Signed)
[place] Psychiat[facility] Consult   Reason for Consult:  Consult for 70 year old woman with a history of schizoaffective disorder who is currently in the critical care unit recovering from respiratory failure Referring Physician:  Ashby Dawes Patient Identification: RIYANSHI WAHAB MRN:  [id number] Principal Diagnosis: <principal problem not specified> Diagnosis:   Patient Active Problem List   Diagnosis Date Noted  . Acute respiratory failure ([place]) [J96.00] 10/13/2015  . Involuntary commitment [Z04.6] 09/02/2015  . Schizoaffective disorder, bipolar type ([place]) [F25.0]   . Urinary incontinence [R32] 10/30/2014  . GERD (gastroesophageal reflux disease) [K21.9] 10/30/2014  . OSA (obstructive sleep apnea) [G47.33] 10/30/2014  . Osteoarthrosis, unspecified whether generalized or localized, involving lower leg [M17.9] 10/30/2014  . COPD (chronic obstructive pulmonary disease) ([place]) [J44.9] 10/30/2014  . Chronic diastolic CHF (congestive heart failure) ([place]) [I50.32] 05/15/2014  . Obesity [E66.9]   . History of colon polyps [Z86.010] 03/09/2013  . Renal mass [N28.89] 06/26/2011  . 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 Hypertension [I10] 06/24/2011  . Diabetes mellitus ([place]) [E11.9] 06/24/2011    Total Time spent with patient: 45 minutes  Subjective:   ALYSSAMAE KLINCK is a 70 y.o. female patient admitted with patient was unable to give any information because of medical condition.  HPI:  Patient seen. Chart reviewed. Patient is very well known to me. I have seen her in psychiatric treatment settings many times over the years. Patient was admitted to the critical care unit because of respiratory failure and extreme hypertension and worsening CHF. She was ventilated and sedated yesterday but is now awake. She is still very short of breath and is not able to speak very well. She indicated that she was uncomfortable. She indicated some degree of confusion as well. Talked about how she  was angry at her son for "putting me in here". That's her usual, and when she is in the psychiatric hospital. I had to remind her that she was not in a psychiatric admission but in a lifesaving critical care unit. Patient has not been agitated or violent as far as I can tell. Dr. Mamie Nick saw the patient yesterday and made sure that she is still on her Prolixin and Depakote.  Social history: [name] is her legal guardian. She is now living a believe in a group home.  Medical history: Multiple medical problems including hypertension diabetes obesity poor compliance with treatment COPD.  Substance abuse history: No history of any substance abuse problems now or in the past  Past Psychiatric History: Chronic mental health problems with a diagnosis of schizoaffective disorder going back decades. Multiple hospitalizations at multiple facilities. Does not have a history of suicide attempts. When she gets psychotic by contrast she usually gets agitated and hostile threatening and belligerent other people. She is been a little better controlled as she's been older and especially as her son has been able to keep her in a facility. Had been doing pretty well on combination of Depakote and Prolixin.  Risk to Self: Is patient at risk for suicide?: No Risk to Others:   Prior Inpatient Therapy:   Prior Outpatient Therapy:    Past Medical History:  Past Medical History:  Diagnosis Date  . Anxiety   . Arthritis   . Bronchitis   . Bursitis   . CAD (coronary artery disease)   . CHF (congestive heart failure) ([place])   . Chronic cough   . Chronic kidney disease    shadow on x-ray  . COPD (chronic obstructive  pulmonary disease) ([place])   . Depression   . DM2 (diabetes mellitus, type 2) ([place])   . Environmental allergies   . GERD (gastroesophageal reflux disease)   . Hypercholesteremia   . Hypertension   . Left ventricular outflow tract obstruction    a. echo 03/2014: EF 60-65%, hypernamic LV systolic fxn,  mod LVH w/ LVOT gradient estimated at 68 mm Hg w/ valsalva, very small LV internal cavity size, mildly increased LV posterior wall thickness, mild Ao valve scl w/o stenosis, diastolic dysfunction, normal RVSP  . Lower extremity edema   . LVH (left ventricular hypertrophy)    a. echo suggests long standing uncontrolled htn. she will not do well when dehydrated, LV cavity obliteration  . Muscle weakness   . Obesity   . On supplemental oxygen therapy    AS NEEDED  . OSA (obstructive sleep apnea)    does not use machine  . Osteoarthritis   . Schizophrenia ([place])   . Tremors of nervous system   . Wheezing     Past Surgical History:  Procedure Laterality Date  . CATARACT EXTRACTION W/PHACO Left 09/10/2014   Procedure: CATARACT EXTRACTION PHACO AND INTRAOCULAR LENS PLACEMENT (IOC);  Surgeon: Birder Robson, MD;  Location: [facility] ORS;  Service: Ophthalmology;  Laterality: Left;  [place]: 00:44 AP%: 22.8 CDE: 10.24 Fluid lot #[id number]  . CATARACT EXTRACTION W/PHACO Right 10/15/2014   Procedure: CATARACT EXTRACTION PHACO AND INTRAOCULAR LENS PLACEMENT (IOC);  Surgeon: Birder Robson, MD;  Location: [facility] ORS;  Service: Ophthalmology;  Laterality: Right;  [place]: 00:52 AP:40.1 CDE:11.83 LOT PACK #[id number]  . CHOLECYSTECTOMY    . COLONOSCOPY  04/2011   [facility] per patient incomplete  . EYE SURGERY    . JOINT REPLACEMENT     TKR  . KNEE RECONSTRUCTION, MEDIAL PATELLAR FEMORAL LIGAMENT    . RENAL BIOPSY    . TONSILLECTOMY    . TONSILLECTOMY     Family History:  Family History  Problem Relation Age of Onset  . Heart attack Mother   . Colon cancer Neg Hx   . Liver disease Neg Hx    Family Psychiatric  History: No known family history of mental illness Social History:  History  Alcohol Use No    Comment: occ.     History  Drug Use No    Social History   Social History  . Marital status: Widowed    Spouse name: N/A  . Number of children: 2  . Years of education: N/A   Social History Main  Topics  . Smoking status: Former Smoker    Packs/day: 3.00    Years: 40.00  . Smokeless tobacco: Never Used     Comment: quit in 2009  . Alcohol use No     Comment: occ.  . Drug use: No  . Sexual activity: Not Currently   Other Topics Concern  . None   Social History Narrative  . None   Additional Social History:    Allergies:   Allergies  Allergen Reactions  . Haldol [Haloperidol Decanoate] Swelling and Other (See Comments)    Reaction:  Swelling of tongue and blurred vision    . Metformin Diarrhea  . Prednisone Other (See Comments)    Unknown reaction  . Raspberry Swelling and Other (See Comments)    Reaction:  Swelling of lips     Labs:  Results for orders placed or performed during the hospital encounter of 10/12/15 (from the past 48 hour(s))  Glucose, capillary  Status: Abnormal   Collection Time: 10/13/15  4:54 PM  Result Value Ref Range   Glucose-Capillary 173 (H) 65 - 99 mg/dL  Troponin I     Status: None   Collection Time: 10/13/15  5:21 PM  Result Value Ref Range   Troponin I <0.03 <0.03 ng/mL  Glucose, capillary     Status: Abnormal   Collection Time: 10/13/15  8:23 PM  Result Value Ref Range   Glucose-Capillary 168 (H) 65 - 99 mg/dL  Glucose, capillary     Status: Abnormal   Collection Time: 10/14/15 12:35 AM  Result Value Ref Range   Glucose-Capillary 188 (H) 65 - 99 mg/dL  CBC     Status: None   Collection Time: 10/14/15  4:25 AM  Result Value Ref Range   WBC 8.7 3.6 - 11.0 K/uL   RBC 4.75 3.80 - 5.20 MIL/uL   Hemoglobin 13.6 12.0 - 16.0 g/dL   HCT 41.2 35.0 - 47.0 %   MCV 86.7 80.0 - 100.0 fL   MCH 28.6 26.0 - 34.0 pg   MCHC 33.0 32.0 - 36.0 g/dL   RDW 14.5 11.5 - 14.5 %   Platelets 205 150 - 440 K/uL  Basic metabolic panel     Status: Abnormal   Collection Time: 10/14/15  4:25 AM  Result Value Ref Range   Sodium 144 135 - 145 mmol/L   Potassium 4.1 3.5 - 5.1 mmol/L   Chloride 105 101 - 111 mmol/L   CO2 33 (H) 22 - 32 mmol/L    Glucose, Bld 176 (H) 65 - 99 mg/dL   BUN 13 6 - 20 mg/dL   Creatinine, Ser 0.67 0.44 - 1.00 mg/dL   Calcium 9.1 8.9 - 10.3 mg/dL   GFR calc non Af Amer >60 >60 mL/min   GFR calc Af Amer >60 >60 mL/min    Comment: (NOTE) The eGFR has been calculated using the CKD EPI equation. This calculation has not been validated in all clinical situations. eGFR's persistently <60 mL/min signify possible Chronic Kidney Disease.    Anion gap 6 5 - 15  Blood gas, arterial     Status: Abnormal   Collection Time: 10/14/15  5:26 AM  Result Value Ref Range   FIO2 0.32    Delivery systems NASAL CANNULA    pH, Arterial 7.43 7.350 - 7.450   pCO2 arterial 60 (H) 32.0 - 48.0 mmHg   pO2, Arterial 70 (L) 83.0 - 108.0 mmHg   Bicarbonate 39.8 (H) 21.0 - 28.0 mEq/L   Acid-Base Excess 12.8 (H) 0.0 - 3.0 mmol/L   O2 Saturation 94.3 %   Patient temperature 37.0    Collection site RIGHT RADIAL    Sample type ARTERIAL DRAW    Allens test (pass/fail) POSITIVE (A) PASS  Glucose, capillary     Status: Abnormal   Collection Time: 10/14/15  6:14 AM  Result Value Ref Range   Glucose-Capillary 147 (H) 65 - 99 mg/dL  Glucose, capillary     Status: Abnormal   Collection Time: 10/14/15 11:26 AM  Result Value Ref Range   Glucose-Capillary 133 (H) 65 - 99 mg/dL  Glucose, capillary     Status: Abnormal   Collection Time: 10/14/15  6:00 PM  Result Value Ref Range   Glucose-Capillary 123 (H) 65 - 99 mg/dL  Glucose, capillary     Status: Abnormal   Collection Time: 10/15/15 12:11 AM  Result Value Ref Range   Glucose-Capillary 118 (H) 65 - 99 mg/dL  CBC     Status: Abnormal   Collection Time: 10/15/15  4:11 AM  Result Value Ref Range   WBC 11.8 (H) 3.6 - 11.0 K/uL   RBC 5.01 3.80 - 5.20 MIL/uL   Hemoglobin 14.2 12.0 - 16.0 g/dL   HCT 43.4 35.0 - 47.0 %   MCV 86.7 80.0 - 100.0 fL   MCH 28.4 26.0 - 34.0 pg   MCHC 32.7 32.0 - 36.0 g/dL   RDW 14.0 11.5 - 14.5 %   Platelets 202 150 - 440 K/uL  Basic metabolic panel      Status: Abnormal   Collection Time: 10/15/15  4:11 AM  Result Value Ref Range   Sodium 147 (H) 135 - 145 mmol/L   Potassium 4.2 3.5 - 5.1 mmol/L   Chloride 106 101 - 111 mmol/L   CO2 34 (H) 22 - 32 mmol/L   Glucose, Bld 137 (H) 65 - 99 mg/dL   BUN 20 6 - 20 mg/dL   Creatinine, Ser 0.85 0.44 - 1.00 mg/dL   Calcium 9.1 8.9 - 10.3 mg/dL   GFR calc non Af Amer >60 >60 mL/min   GFR calc Af Amer >60 >60 mL/min    Comment: (NOTE) The eGFR has been calculated using the CKD EPI equation. This calculation has not been validated in all clinical situations. eGFR's persistently <60 mL/min signify possible Chronic Kidney Disease.    Anion gap 7 5 - 15  Magnesium     Status: None   Collection Time: 10/15/15  4:11 AM  Result Value Ref Range   Magnesium 2.0 1.7 - 2.4 mg/dL  Glucose, capillary     Status: Abnormal   Collection Time: 10/15/15  5:29 AM  Result Value Ref Range   Glucose-Capillary 145 (H) 65 - 99 mg/dL  Glucose, capillary     Status: Abnormal   Collection Time: 10/15/15 11:37 AM  Result Value Ref Range   Glucose-Capillary 135 (H) 65 - 99 mg/dL    Current Facility-Administered Medications  Medication Dose Route Frequency Provider Last Rate Last Dose  . 0.9 %  sodium chloride infusion   Intravenous Continuous Holley Raring, NP   Stopped at 10/15/15 1314  . acetaminophen (TYLENOL) tablet 650 mg  650 mg Oral Q4H PRN Mikael Spray, NP      . antiseptic oral rinse (CPC / CETYLPYRIDINIUM CHLORIDE 0.05%) solution 7 mL  7 mL Mouth Rinse q12n4p Mikael Spray, NP   7 mL at 10/15/15 1554  . aspirin EC tablet 81 mg  81 mg Oral Daily Mikael Spray, NP   81 mg at 10/15/15 1000  . atorvastatin (LIPITOR) tablet 20 mg  20 mg Oral QHS Mikael Spray, NP   Stopped at 10/14/15 2103  . carvedilol (COREG) tablet 6.25 mg  6.25 mg Oral BID Laverle Hobby, MD      . chlorhexidine (PERIDEX) 0.12 % solution 15 mL  15 mL Mouth Rinse BID Mikael Spray, NP   15 mL at 10/15/15  0959  . dicyclomine (BENTYL) tablet 20 mg  20 mg Oral TID PRN Mikael Spray, NP      . divalproex (DEPAKOTE) DR tablet 500 mg  500 mg Oral Q12H Gonzella Lex, MD      . enalaprilat (VASOTEC) injection 0.625 mg  0.625 mg Intravenous Q8H Laverle Hobby, MD   0.625 mg at 10/15/15 1554  . enoxaparin (LOVENOX) injection 40 mg  40 mg Subcutaneous Q12H Laverle Hobby, MD   40  mg at 10/15/15 0959  . famotidine (PEPCID) IVPB 20 mg premix  20 mg Intravenous Q12H Laverle Hobby, MD   20 mg at 10/15/15 0959  . fluPHENAZine (PROLIXIN) tablet 15 mg  15 mg Oral QHS Laverle Hobby, MD      . fluPHENAZine (PROLIXIN) tablet 5 mg  5 mg Oral Daily Laverle Hobby, MD   5 mg at 10/15/15 1146  . fluPHENAZine decanoate (PROLIXIN) injection 25 mg  25 mg Intramuscular Q14 Days Mikael Spray, NP   25 mg at 10/13/15 1712  . furosemide (LASIX) injection 20 mg  20 mg Intravenous BID Laverle Hobby, MD   20 mg at 10/15/15 0820  . hydrALAZINE (APRESOLINE) injection 10-40 mg  10-40 mg Intravenous Q4H PRN Mikael Spray, NP   20 mg at 10/15/15 1313  . insulin aspart (novoLOG) injection 0-15 Units  0-15 Units Subcutaneous Q6H Mikael Spray, NP   2 Units at 10/15/15 1145  . ipratropium-albuterol (DUONEB) 0.5-2.5 (3) MG/3ML nebulizer solution 3 mL  3 mL Nebulization Q6H Magadalene S Patria Mane, NP   3 mL at 10/15/15 1328  . LORazepam (ATIVAN) injection 1 mg  1 mg Intravenous Q4H PRN Wilhelmina Mcardle, MD   1 mg at 10/15/15 0840  . metoprolol (LOPRESSOR) injection 2.5-5 mg  2.5-5 mg Intravenous Q3H PRN Laverle Hobby, MD   5 mg at 10/15/15 1625  . morphine 2 MG/ML injection 2 mg  2 mg Intravenous Q2H PRN Mikael Spray, NP      . ondansetron (ZOFRAN) injection 4 mg  4 mg Intravenous Q6H PRN Mikael Spray, NP      . senna-docusate (Senokot-S) tablet 2 tablet  2 tablet Oral BID Laverle Hobby, MD   2 tablet at 10/15/15 1146  . traMADol (ULTRAM) tablet 50 mg  50 mg Oral  Q6H PRN Mikael Spray, NP   50 mg at 10/15/15 1146  . verapamil (CALAN) tablet 40 mg  40 mg Oral Q6H Mikael Spray, NP   40 mg at 10/15/15 1146    Musculoskeletal: Strength & Muscle Tone: decreased Gait & Station: unable to stand Patient leans: N/A  Psychiatric Specialty Exam: Physical Exam  Constitutional: She appears well-developed and well-nourished. She appears distressed.  HENT:  Head: Normocephalic and atraumatic.  Eyes: Conjunctivae are normal. Pupils are equal, round, and reactive to light.  Neck: Normal range of motion.  Cardiovascular: Normal heart sounds.   Respiratory: She is in respiratory distress.  GI: Soft.  Musculoskeletal: Normal range of motion.  Neurological: She is alert.  Skin: Skin is warm and dry.  Psychiatric: Her affect is blunt. Her speech is delayed. She is slowed. She expresses impulsivity. She expresses no suicidal ideation. She exhibits abnormal recent memory.    Review of Systems  Constitutional: Positive for malaise/fatigue.  HENT: Negative.   Eyes: Negative.   Respiratory: Positive for cough and shortness of breath.   Cardiovascular: Negative.   Gastrointestinal: Negative.   Musculoskeletal: Negative.   Skin: Negative.   Neurological: Positive for weakness.    Blood pressure (!) 170/93, pulse 85, temperature 97.6 F (36.4 C), temperature source Axillary, resp. rate (!) 31, height '5\' 7"'  (1.702 m), weight 131.3 kg (289 lb 7.4 oz), SpO2 98 %.Body mass index is 45.34 kg/m.  General Appearance: Casual  Eye Contact:  Fair  Speech:  Slow  Volume:  Decreased  Mood:  Anxious  Affect:  Congruent  Thought Process:  Goal Directed  Orientation:  Full (Time, Place, and Person)  Thought Content:  Illogical  Suicidal Thoughts:  No  Homicidal Thoughts:  No  Memory:  Immediate;   Fair Recent;   Poor Remote;   Fair  Judgement:  Fair  Insight:  Fair  Psychomotor Activity:  Decreased  Concentration:  Concentration: Poor  Recall:  [employer] of Knowledge:  Fair  Language:  Fair  Akathisia:  No  Handed:  Right  AIMS (if indicated):     Assets:  Financial Resources/Insurance Social Support  ADL's:  Impaired  Cognition:  Impaired,  Mild  Sleep:        Treatment Plan Summary: Daily contact with patient to assess and evaluate symptoms and progress in treatment, Medication management and Plan 70 year old woman with schizoaffective disorder and multiple medical problems. Currently in the critical care unit. Recovering from severe illness. When I came to see her she was cooperative with treatment and was calm and not lashing out. She was a little bit confused but easily redirected. Liver function and kidney function appears to be good. Should be able to tolerate her regular medicine. She is not having any signs of EPS or bad reactions to her medicine. Continue Depakote and Prolixin doses. Did some supportive counseling and did some explaining to her about her current situation. I will continue to check up daily.  Disposition: Patient does not meet criteria for psychiatric inpatient admission. Supportive therapy provided about ongoing stressors.  Alethia Berthold, MD 10/15/2015 4:39 PM

## 2015-10-15 NOTE — Progress Notes (Signed)
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   ASSESSMENT / PLAN:  NEUROLOGIC/Psych A:   Delirium/confusion/aggression-Psych following.  Acute encephalopathy likely due to hypercarbia improved.   P:   Continue prolixin IM. Depakon.   Ativan prn.     PULMONARY A: Acute on chronic hypercarbic respiratory failure. Pt is often non-compliant with bipap use.  H/O COPD H/o Bronchitis P:   -Continue BiPAP qhs as tolerated by patient.   -ABG on 8/22 7.43/60/70/39.8; consistent with compensated hypercapnic respiratory failure. -Nebulized steroids and bronchodilator -Lasix twice a day.   CARDIOVASCULAR A:  Uncontrolled hypertension Lower extremity edema H/o LV outflow obstruction H/O CHF-Last 2 D ECHO WAS IN FEB 2016 CAD P:  --Diurese conservatively. -2-D echo pending.  -Hemodynamics per ICU protocol -Hydralazine prn to keep SBP<140 -Continue home dose of lisinopril, verapamil and ASA   STUDIES:  2-E ECHO 08/21>   RENAL A:   Right renal mass-last biopsy was 05/15/2015 with results negative for malignancy P:   -F/U with Dr [name] [name] at [place] creatinine -Monitor and correct electrolyte imbalances  GASTROINTESTINAL A:   H/O GERD P:   -Advance diet.  -Famotidine 20mg  bid  HEMATOLOGIC A:   No acute issues P:  -Heparin for VTE prophylaxis  ENDOCRINE A:   Type 2 DM   P:   -Lantus 10 units QHS -Blood glucose monitoring with sliding scale insulin coverage     SIGNIFICANT EVENTS: 08/20>ED with acute dyspnea 8/21> delirium, placed on precedex, psych consulted.    ---------------------------------------   ----------------------------------------   Name: [name] [name] MRN: [id number] DOB: 1945-07-31    ADMISSION DATE:  10/12/2015    SUBJECTIVE:   Pt currently confused/sedated can not provide history or review of systems.   Review of Systems:  --   VITAL SIGNS: Temp:  [97 F (36.1 C)-99.2 F (37.3 C)] 99.2 F (37.3 C) (08/23 0757) Pulse Rate:  [59-94] 94 (08/23 0600) Resp:  [15-31] 20 (08/23 0600) BP: (115-175)/(61-113) 158/98 (08/23 0500) SpO2:  [97 %-100 %] 99 % (08/23 0600) FiO2 (%):  [24 %-32 %] 24 % (08/23 0153) Weight:  [289 lb 7.4 oz (131.3 kg)] 289 lb 7.4 oz (131.3 kg) (08/23 0400) HEMODYNAMICS:   VENTILATOR SETTINGS: FiO2 (%):  [24 %-32 %] 24 % INTAKE / OUTPUT:  Intake/Output Summary (Last 24 hours) at 10/15/15 0817 Last data filed at 10/15/15 0800  Gross per 24 hour  Intake           543.98 ml  Output             3100 ml  Net         -2556.02 ml    PHYSICAL EXAMINATION: Physical Examination:   VS: BP (!) 158/98   Pulse 94   Temp 99.2 F (37.3 C) (Axillary)   Resp 20   Ht 5\' 7"  (1.702 m)   Wt 289 lb 7.4 oz (131.3 kg)   SpO2 99%   BMI 45.34 kg/m   General Appearance: No distress  Neuro:without focal findings, mental status reduced HEENT: PERRLA, EOM intact. Pulmonary: normal breath sounds   CardiovascularNormal S1,S2.  No m/r/g.   Abdomen: Benign, Soft, non-tender. Renal:  No costovertebral tenderness  GU:  Not performed at this time. Endocrine: No evident thyromegaly. Skin:  warm, no rashes, no ecchymosis  Extremities: normal, no cyanosis, clubbing.   LABS:   LABORATORY PANEL:   CBC  Recent Labs Lab 10/15/15 0411  WBC 11.8*  HGB 14.2  HCT 43.4  PLT 202    Chemistries   Recent Labs Lab 10/12/15 2032  10/13/15 0537  10/15/15 0411  NA 142  --  142  < > 147*  K 4.5  --  4.9  < > 4.2  CL 102  --  102  < > 106  CO2 35*  --  35*  < > 34*  GLUCOSE 143*  --  115*  < > 137*   BUN 16  --  13  < > 20  CREATININE 0.82  --  0.74  < > 0.85  CALCIUM 8.8*  --  8.9  < > 9.1  MG  --   < > 2.1  --  2.0  PHOS  --   --  4.0  --   --   AST 21  --   --   --   --   ALT 16  --   --   --   --   ALKPHOS 73  --   --   --   --   BILITOT 0.2*  --   --   --   --   < > = values in this interval not displayed.   Recent Labs Lab 10/14/15 0035 10/14/15 0614 10/14/15 1126 10/14/15 1800 10/15/15 0011 10/15/15 0529  GLUCAP 188* 147* 133* 123* 118* 145*    Recent Labs Lab 10/13/15 0250 10/13/15 1140 10/14/15 0526  PHART 7.33* 7.35 7.43  PCO2ART 68* 71* 60*  PO2ART 144* 70* 70*    Recent Labs Lab 10/12/15 2032  AST 21  ALT 16  ALKPHOS 73  BILITOT 0.2*  ALBUMIN 3.7    Cardiac Enzymes  Recent Labs Lab 10/13/15 1721  TROPONINI <0.03    RADIOLOGY:  Dg Chest 1 View  Result Date: 10/14/2015 CLINICAL DATA:  70 year old female with shortness of breath. Initial encounter. EXAM: CHEST 1 VIEW COMPARISON:  10/13/2015 and earlier. FINDINGS: Portable AP semi upright view at 0606 hours. Cardiac size is accentuated by portable technique. Mild cardiomegaly and mediastinal contours are stable. Stable somewhat low lung volumes. No pneumothorax, pleural effusion or consolidation. Stable pulmonary vascularity without overt edema. IMPRESSION: Stable ventilation with somewhat low lung volumes, vascular congestion without overt edema, and no focal cardiopulmonary abnormality. Electronically Signed   By: Genevie Ann M.D.   On: 10/14/2015 07:30       --Marda Stalker, MD.  ICU Pager: [phone] [facility] Office Number: [phone]  Patricia Pesa, M.D.  Vilinda Boehringer, M.D.  Merton Border, M.D  10/15/2015   Critical Care Attestation.  I have personally obtained a history, examined the patient, evaluated laboratory and imaging results, formulated the assessment and plan and placed orders. The Patient requires high complexity decision making for  assessment and support, frequent evaluation and titration of therapies, application of advanced monitoring technologies and extensive interpretation of multiple databases. The patient has critical illness that could lead imminently to failure of 1 or more organ systems and requires the highest level of physician preparedness to intervene.  Critical Care Time devoted to patient care services described in this note is 35 minutes and is exclusive of time spent in procedures.

## 2015-10-16 LAB — BASIC METABOLIC PANEL WITH GFR
Anion gap: 9 (ref 5–15)
BUN: 22 mg/dL — ABNORMAL HIGH (ref 6–20)
CO2: 31 mmol/L (ref 22–32)
Calcium: 9.1 mg/dL (ref 8.9–10.3)
Chloride: 106 mmol/L (ref 101–111)
Creatinine, Ser: 0.75 mg/dL (ref 0.44–1.00)
GFR calc Af Amer: >60 mL/min
GFR calc non Af Amer: >60 mL/min
Glucose, Bld: 140 mg/dL — ABNORMAL HIGH (ref 65–99)
Potassium: 3.5 mmol/L (ref 3.5–5.1)
Sodium: 146 mmol/L — ABNORMAL HIGH (ref 135–145)

## 2015-10-16 LAB — CBC
HCT: 43.2 % (ref 35.0–47.0)
Hemoglobin: 14.2 g/dL (ref 12.0–16.0)
MCH: 28.4 pg (ref 26.0–34.0)
MCHC: 32.9 g/dL (ref 32.0–36.0)
MCV: 86.4 fL (ref 80.0–100.0)
Platelets: 237 K/uL (ref 150–440)
RBC: 5 MIL/uL (ref 3.80–5.20)
RDW: 14.7 % — ABNORMAL HIGH (ref 11.5–14.5)
WBC: 15.6 K/uL — ABNORMAL HIGH (ref 3.6–11.0)

## 2015-10-16 LAB — GLUCOSE, CAPILLARY
GLUCOSE-CAPILLARY: 139 mg/dL — AB (ref 65–99)
Glucose-Capillary: 130 mg/dL — ABNORMAL HIGH (ref 65–99)
Glucose-Capillary: 140 mg/dL — ABNORMAL HIGH (ref 65–99)
Glucose-Capillary: 150 mg/dL — ABNORMAL HIGH (ref 65–99)
Glucose-Capillary: 154 mg/dL — ABNORMAL HIGH (ref 65–99)

## 2015-10-16 MED ORDER — VERAPAMIL HCL 120 MG PO TABS
120.0000 mg | ORAL_TABLET | Freq: Two times a day (BID) | ORAL | Status: DC
  Administered 2015-10-16 – 2015-10-22 (×13): 120 mg via ORAL
  Filled 2015-10-16 (×7): qty 1
  Filled 2015-10-16: qty 3
  Filled 2015-10-16 (×7): qty 1

## 2015-10-16 MED ORDER — CARVEDILOL 6.25 MG PO TABS
12.5000 mg | ORAL_TABLET | Freq: Two times a day (BID) | ORAL | Status: DC
Start: 2015-10-16 — End: 2015-10-16

## 2015-10-16 MED ORDER — CARVEDILOL 25 MG PO TABS: 25.0000 mg | ORAL_TABLET | Freq: Two times a day (BID) | ORAL | Status: DC

## 2015-10-16 MED ORDER — FUROSEMIDE 20 MG PO TABS
20.0000 mg | ORAL_TABLET | Freq: Two times a day (BID) | ORAL | Status: DC
  Administered 2015-10-16 – 2015-10-17 (×2): 20 mg via ORAL
  Filled 2015-10-16 (×3): qty 1

## 2015-10-16 MED ORDER — CARVEDILOL 12.5 MG PO TABS
12.5000 mg | ORAL_TABLET | Freq: Two times a day (BID) | ORAL | Status: DC
  Administered 2015-10-16 – 2015-10-22 (×11): 12.5 mg via ORAL
  Filled 2015-10-16 (×3): qty 1
  Filled 2015-10-16: qty 2
  Filled 2015-10-16 (×8): qty 1

## 2015-10-16 MED ORDER — INSULIN ASPART 100 UNIT/ML ~~LOC~~ SOLN
0.0000 [IU] | Freq: Three times a day (TID) | SUBCUTANEOUS | Status: DC
  Administered 2015-10-16 – 2015-10-17 (×3): 1 [IU] via SUBCUTANEOUS
  Administered 2015-10-18: 2 [IU] via SUBCUTANEOUS
  Administered 2015-10-18: 1 [IU] via SUBCUTANEOUS
  Administered 2015-10-19: 3 [IU] via SUBCUTANEOUS
  Administered 2015-10-20: 2 [IU] via SUBCUTANEOUS
  Administered 2015-10-20 – 2015-10-21 (×2): 1 [IU] via SUBCUTANEOUS
  Administered 2015-10-21: 2 [IU] via SUBCUTANEOUS
  Administered 2015-10-22: 3 [IU] via SUBCUTANEOUS
  Administered 2015-10-22: 2 [IU] via SUBCUTANEOUS
  Filled 2015-10-16: qty 4
  Filled 2015-10-16: qty 2
  Filled 2015-10-16 (×4): qty 1
  Filled 2015-10-16 (×3): qty 2
  Filled 2015-10-16: qty 1
  Filled 2015-10-16: qty 3
  Filled 2015-10-16: qty 1

## 2015-10-16 MED ORDER — FLUPHENAZINE HCL 5 MG PO TABS
15.0000 mg | ORAL_TABLET | Freq: Every day | ORAL | Status: DC
  Administered 2015-10-16 – 2015-10-21 (×6): 15 mg via ORAL
  Filled 2015-10-16 (×8): qty 3

## 2015-10-16 MED ORDER — INSULIN ASPART 100 UNIT/ML ~~LOC~~ SOLN: 0.0000 [IU] | Freq: Every day | SUBCUTANEOUS | Status: DC

## 2015-10-16 NOTE — Progress Notes (Signed)
Patient complaining of constipation, thinks she needs to be disimpacted. Ok to try per Dr. Posey Pronto.

## 2015-10-16 NOTE — Evaluation (Signed)
Physical Therapy Evaluation Patient Details Name: [name] [name] MRN: [id number] DOB: [birth year] Today's Date: 10/16/2015   History of Present Illness  70 y/o female with a h/o COPD, bronchitis, CHF and right renal mass(benign?) presenting with acute on chronic  hypercarbic respiratory failure and uncontrolled hypertension likely due to non-adherence.  Clinical Impression  Difficult to fully assess pt regarding both physical performance/ability and history/PLOF secondary to lethargy, fatigue.  She was inconsistent with ability to stay awake, answer questions and generally was very weak and limited with ability to participate. She is on HFNC t/o the session and O2 was generally in the mid 90s as was HR.  Pt inappropriate to get up to sitting or standing today - unsure of PLOF though she indicated that she was able to do some limited walking.      Follow Up Recommendations SNF    Equipment Recommendations       Recommendations for Other Services       Precautions / Restrictions Precautions Precautions: Fall Restrictions Weight Bearing Restrictions: No      Mobility  Bed Mobility               General bed mobility comments: Pt very lethargic, weak, unable to follow instructions with most acts and mobility did not seem to be an appropriate option at this time.  Transfers                    Ambulation/Gait                Stairs            Wheelchair Mobility    Modified Rankin (Stroke Patients Only)       Balance                                             Pertinent Vitals/Pain Pain Assessment:  (unable to rate, does not indicate excessive pain with mvt)    Home Living Family/patient expects to be discharged to:: Skilled nursing facility                 Additional Comments: Apparently she is a resident at [facility], pt indicated that she lived with her son... history taking difficult secondary to lethargy and  inability to speak clearly    Prior Function Level of Independence: Independent with assistive device(s) (per pt report, though again difficult to assess fully)               Hand Dominance        Extremity/Trunk Assessment   Upper Extremity Assessment: Generalized weakness (b/l UEs grossly 2/5, able to do some inconsistent grip)           Lower Extremity Assessment: Generalized weakness (grossly 2+/5, pt with inconsistent ability to initiate LE mv)         Communication   Communication:  (shortness of breath very weak and inconsistent vocalizations)  Cognition Arousal/Alertness: Lethargic Behavior During Therapy: Flat affect (eyes closed t/o most of session despite cuing, very weak) Overall Cognitive Status: Difficult to assess                      General Comments      Exercises General Exercises - Lower Extremity Ankle Circles/Pumps: AAROM;PROM;10 reps Heel Slides: PROM;AAROM;5 reps Hip ABduction/ADduction: AAROM;10 reps  Assessment/Plan    PT Assessment Patient needs continued PT services  PT Diagnosis Generalized weakness;Difficulty walking   PT Problem List Decreased strength;Decreased range of motion;Decreased activity tolerance;Decreased balance;Decreased cognition;Decreased safety awareness;Cardiopulmonary status limiting activity;Obesity;Decreased mobility  PT Treatment Interventions DME instruction;Gait training;Stair training;Functional mobility training;Therapeutic activities;Therapeutic exercise;Balance training;Neuromuscular re-education;Cognitive remediation   PT Goals (Current goals can be found in the Care Plan section) Acute Rehab PT Goals Patient Stated Goal: unable PT Goal Formulation: Patient unable to participate in goal setting Time For Goal Achievement: 10/30/15 Potential to Achieve Goals: Fair    Frequency Min 2X/week   Barriers to discharge        Co-evaluation               End of Session Equipment  Utilized During Treatment: Gait belt Activity Tolerance: Patient limited by fatigue;Patient limited by lethargy Patient left: with bed alarm set;with call bell/phone within reach           Time: [id number] PT Time Calculation (min) (ACUTE ONLY): 21 min   Charges:   PT Evaluation $PT Eval Low Complexity: 1 Procedure     PT G CodesKreg Shropshire, DPT 10/16/2015, 4:16 PM

## 2015-10-16 NOTE — Consult Note (Signed)
[place] [facility] Consult   Reason for Consult:  Consult for 70 year old woman with a history of schizoaffective disorder who is currently in the critical care unit recovering from respiratory failure Referring Physician:  Ashby Dawes Patient Identification: [name] [name] MRN:  [id number] Principal Diagnosis: Schizoaffective disorder, bipolar type [facility]) Diagnosis:   Patient Active Problem List   Diagnosis Date Noted  . Acute respiratory failure ([place]) [J96.00] 10/13/2015  . Involuntary commitment [Z04.6] 09/02/2015  . Schizoaffective disorder, bipolar type ([place]) [F25.0]   . Urinary incontinence [R32] 10/30/2014  . GERD (gastroesophageal reflux disease) [K21.9] 10/30/2014  . OSA (obstructive sleep apnea) [G47.33] 10/30/2014  . Osteoarthrosis, unspecified whether generalized or localized, involving lower leg [M17.9] 10/30/2014  . COPD (chronic obstructive pulmonary disease) ([place]) [J44.9] 10/30/2014  . Chronic diastolic CHF (congestive heart failure) ([place]) [I50.32] 05/15/2014  . Obesity [E66.9]   . History of colon polyps [Z86.010] 03/09/2013  . Renal mass [N28.89] 06/26/2011  . 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 Hypertension [I10] 06/24/2011  . Diabetes mellitus ([place]) [E11.9] 06/24/2011    Total Time spent with patient: 20 minutes  Subjective:   [name] [name] is a 70 y.o. female patient admitted with "I am wonderful"  Follow-up note for this 70 year old woman with schizoaffective disorder and multiple medical problems. She has been moved out of the critical care unit onto a regular hospital room today. Patient was drowsy when I came in but immediately woke up when I spoke her name. She told me she was feeling "wonderful". This is more like her usual baseline response. She did not make any paranoid statements. Clearly still tired out and feeling sick but seems to be cooperative and calm. Not engaging in a dangerous or agitated behavior. Tolerating medicine  well.  HPI:  Patient seen. Chart reviewed. Patient is very well known to me. I have seen her in psychiatric treatment settings many times over the years. Patient was admitted to the critical care unit because of respiratory failure and extreme hypertension and worsening CHF. She was ventilated and sedated yesterday but is now awake. She is still very short of breath and is not able to speak very well. She indicated that she was uncomfortable. She indicated some degree of confusion as well. Talked about how she was angry at her son for "putting me in here". That's her usual, and when she is in the psychiatric hospital. I had to remind her that she was not in a psychiatric admission but in a lifesaving critical care unit. Patient has not been agitated or violent as far as I can tell. Dr. Mamie Nick saw the patient yesterday and made sure that she is still on her Prolixin and Depakote.  Social history: [name] is her legal guardian. She is now living a believe in a group home.  Medical history: Multiple medical problems including hypertension diabetes obesity poor compliance with treatment COPD.  Substance abuse history: No history of any substance abuse problems now or in the past  Past Psychiatric History: Chronic mental health problems with a diagnosis of schizoaffective disorder going back decades. Multiple hospitalizations at multiple facilities. Does not have a history of suicide attempts. When she gets psychotic by contrast she usually gets agitated and hostile threatening and belligerent other people. She is been a little better controlled as she's been older and especially as her son has been able to keep her in a facility. Had been doing pretty well on combination of Depakote and Prolixin.  Risk to  Self: Is patient at risk for suicide?: No Risk to Others:   Prior Inpatient Therapy:   Prior Outpatient Therapy:    Past Medical History:  Past Medical History:  Diagnosis Date  . Anxiety   .  Arthritis   . Bronchitis   . Bursitis   . CAD (coronary artery disease)   . CHF (congestive heart failure) ([place])   . Chronic cough   . Chronic kidney disease    shadow on x-ray  . COPD (chronic obstructive pulmonary disease) ([place])   . Depression   . DM2 (diabetes mellitus, type 2) ([place])   . Environmental allergies   . GERD (gastroesophageal reflux disease)   . Hypercholesteremia   . Hypertension   . Left ventricular outflow tract obstruction    a. echo 03/2014: EF 60-65%, hypernamic LV systolic fxn, mod LVH w/ LVOT gradient estimated at 68 mm Hg w/ valsalva, very small LV internal cavity size, mildly increased LV posterior wall thickness, mild Ao valve scl w/o stenosis, diastolic dysfunction, normal RVSP  . Lower extremity edema   . LVH (left ventricular hypertrophy)    a. echo suggests long standing uncontrolled htn. she will not do well when dehydrated, LV cavity obliteration  . Muscle weakness   . Obesity   . On supplemental oxygen therapy    AS NEEDED  . OSA (obstructive sleep apnea)    does not use machine  . Osteoarthritis   . Schizophrenia ([place])   . Tremors of nervous system   . Wheezing     Past Surgical History:  Procedure Laterality Date  . CATARACT EXTRACTION W/PHACO Left 09/10/2014   Procedure: CATARACT EXTRACTION PHACO AND INTRAOCULAR LENS PLACEMENT (IOC);  Surgeon: Birder Robson, MD;  Location: [facility] ORS;  Service: Ophthalmology;  Laterality: Left;  [place]: 00:44 AP%: 22.8 CDE: 10.24 Fluid lot #[id number]  . CATARACT EXTRACTION W/PHACO Right 10/15/2014   Procedure: CATARACT EXTRACTION PHACO AND INTRAOCULAR LENS PLACEMENT (IOC);  Surgeon: Birder Robson, MD;  Location: [facility] ORS;  Service: Ophthalmology;  Laterality: Right;  [place]: 00:52 AP:40.1 CDE:11.83 LOT PACK #[id number]  . CHOLECYSTECTOMY    . COLONOSCOPY  04/2011   [facility] per patient incomplete  . EYE SURGERY    . JOINT REPLACEMENT     TKR  . KNEE RECONSTRUCTION, MEDIAL PATELLAR FEMORAL LIGAMENT    . RENAL  BIOPSY    . TONSILLECTOMY    . TONSILLECTOMY     Family History:  Family History  Problem Relation Age of Onset  . Heart attack Mother   . Colon cancer Neg Hx   . Liver disease Neg Hx    Family Psychiatric  History: No known family history of mental illness Social History:  History  Alcohol Use No    Comment: occ.     History  Drug Use No    Social History   Social History  . Marital status: Widowed    Spouse name: N/A  . Number of children: 2  . Years of education: N/A   Social History Main Topics  . Smoking status: Former Smoker    Packs/day: 3.00    Years: 40.00  . Smokeless tobacco: Never Used     Comment: quit in 2009  . Alcohol use No     Comment: occ.  . Drug use: No  . Sexual activity: Not Currently   Other Topics Concern  . None   Social History Narrative  . None   Additional Social History:    Allergies:   Allergies  Allergen Reactions  . Haldol [Haloperidol Decanoate] Swelling and Other (See Comments)    Reaction:  Swelling of tongue and blurred vision    . Metformin Diarrhea  . Prednisone Other (See Comments)    Unknown reaction  . Raspberry Swelling and Other (See Comments)    Reaction:  Swelling of lips     Labs:  Results for orders placed or performed during the hospital encounter of 10/12/15 (from the past 48 hour(s))  Glucose, capillary     Status: Abnormal   Collection Time: 10/14/15  6:00 PM  Result Value Ref Range   Glucose-Capillary 123 (H) 65 - 99 mg/dL  Glucose, capillary     Status: Abnormal   Collection Time: 10/15/15 12:11 AM  Result Value Ref Range   Glucose-Capillary 118 (H) 65 - 99 mg/dL  CBC     Status: Abnormal   Collection Time: 10/15/15  4:11 AM  Result Value Ref Range   WBC 11.8 (H) 3.6 - 11.0 K/uL   RBC 5.01 3.80 - 5.20 MIL/uL   Hemoglobin 14.2 12.0 - 16.0 g/dL   HCT 43.4 35.0 - 47.0 %   MCV 86.7 80.0 - 100.0 fL   MCH 28.4 26.0 - 34.0 pg   MCHC 32.7 32.0 - 36.0 g/dL   RDW 14.0 11.5 - 14.5 %   Platelets  202 150 - 440 K/uL  Basic metabolic panel     Status: Abnormal   Collection Time: 10/15/15  4:11 AM  Result Value Ref Range   Sodium 147 (H) 135 - 145 mmol/L   Potassium 4.2 3.5 - 5.1 mmol/L   Chloride 106 101 - 111 mmol/L   CO2 34 (H) 22 - 32 mmol/L   Glucose, Bld 137 (H) 65 - 99 mg/dL   BUN 20 6 - 20 mg/dL   Creatinine, Ser 0.85 0.44 - 1.00 mg/dL   Calcium 9.1 8.9 - 10.3 mg/dL   GFR calc non Af Amer >60 >60 mL/min   GFR calc Af Amer >60 >60 mL/min    Comment: (NOTE) The eGFR has been calculated using the CKD EPI equation. This calculation has not been validated in all clinical situations. eGFR's persistently <60 mL/min signify possible Chronic Kidney Disease.    Anion gap 7 5 - 15  Magnesium     Status: None   Collection Time: 10/15/15  4:11 AM  Result Value Ref Range   Magnesium 2.0 1.7 - 2.4 mg/dL  Glucose, capillary     Status: Abnormal   Collection Time: 10/15/15  5:29 AM  Result Value Ref Range   Glucose-Capillary 145 (H) 65 - 99 mg/dL  Glucose, capillary     Status: Abnormal   Collection Time: 10/15/15 11:37 AM  Result Value Ref Range   Glucose-Capillary 135 (H) 65 - 99 mg/dL  Glucose, capillary     Status: Abnormal   Collection Time: 10/15/15  5:20 PM  Result Value Ref Range   Glucose-Capillary 154 (H) 65 - 99 mg/dL  Glucose, capillary     Status: Abnormal   Collection Time: 10/16/15 12:20 AM  Result Value Ref Range   Glucose-Capillary 154 (H) 65 - 99 mg/dL  CBC     Status: Abnormal   Collection Time: 10/16/15  5:30 AM  Result Value Ref Range   WBC 15.6 (H) 3.6 - 11.0 K/uL   RBC 5.00 3.80 - 5.20 MIL/uL   Hemoglobin 14.2 12.0 - 16.0 g/dL   HCT 43.2 35.0 - 47.0 %   MCV 86.4 80.0 -  100.0 fL   MCH 28.4 26.0 - 34.0 pg   MCHC 32.9 32.0 - 36.0 g/dL   RDW 14.7 (H) 11.5 - 14.5 %   Platelets 237 150 - 440 K/uL  Basic metabolic panel     Status: Abnormal   Collection Time: 10/16/15  5:30 AM  Result Value Ref Range   Sodium 146 (H) 135 - 145 mmol/L   Potassium  3.5 3.5 - 5.1 mmol/L   Chloride 106 101 - 111 mmol/L   CO2 31 22 - 32 mmol/L   Glucose, Bld 140 (H) 65 - 99 mg/dL   BUN 22 (H) 6 - 20 mg/dL   Creatinine, Ser 0.75 0.44 - 1.00 mg/dL   Calcium 9.1 8.9 - 10.3 mg/dL   GFR calc non Af Amer >60 >60 mL/min   GFR calc Af Amer >60 >60 mL/min    Comment: (NOTE) The eGFR has been calculated using the CKD EPI equation. This calculation has not been validated in all clinical situations. eGFR's persistently <60 mL/min signify possible Chronic Kidney Disease.    Anion gap 9 5 - 15  Glucose, capillary     Status: Abnormal   Collection Time: 10/16/15  5:44 AM  Result Value Ref Range   Glucose-Capillary 140 (H) 65 - 99 mg/dL  Glucose, capillary     Status: Abnormal   Collection Time: 10/16/15 12:28 PM  Result Value Ref Range   Glucose-Capillary 139 (H) 65 - 99 mg/dL    Current Facility-Administered Medications  Medication Dose Route Frequency Provider Last Rate Last Dose  . 0.9 %  sodium chloride infusion   Intravenous Continuous Holley Raring, NP   Stopped at 10/15/15 1314  . acetaminophen (TYLENOL) tablet 650 mg  650 mg Oral Q4H PRN Mikael Spray, NP      . antiseptic oral rinse (CPC / CETYLPYRIDINIUM CHLORIDE 0.05%) solution 7 mL  7 mL Mouth Rinse q12n4p Mikael Spray, NP   7 mL at 10/16/15 1600  . aspirin EC tablet 81 mg  81 mg Oral Daily Mikael Spray, NP   81 mg at 10/16/15 0940  . atorvastatin (LIPITOR) tablet 20 mg  20 mg Oral QHS Mikael Spray, NP   20 mg at 10/15/15 2134  . carvedilol (COREG) tablet 12.5 mg  12.5 mg Oral BID Laverle Hobby, MD   12.5 mg at 10/16/15 5427  . chlorhexidine (PERIDEX) 0.12 % solution 15 mL  15 mL Mouth Rinse BID Mikael Spray, NP   15 mL at 10/16/15 0938  . dicyclomine (BENTYL) tablet 20 mg  20 mg Oral TID PRN Mikael Spray, NP      . divalproex (DEPAKOTE) DR tablet 500 mg  500 mg Oral Q12H Gonzella Lex, MD   500 mg at 10/16/15 0940  . enalaprilat (VASOTEC) injection  0.625 mg  0.625 mg Intravenous Q8H Laverle Hobby, MD   0.625 mg at 10/16/15 1517  . enoxaparin (LOVENOX) injection 40 mg  40 mg Subcutaneous Q12H Laverle Hobby, MD   40 mg at 10/16/15 0623  . famotidine (PEPCID) IVPB 20 mg premix  20 mg Intravenous Q12H Laverle Hobby, MD   20 mg at 10/16/15 0939  . fluPHENAZine (PROLIXIN) tablet 15 mg  15 mg Oral QHS Sheema M Hallaji, RPH      . fluPHENAZine (PROLIXIN) tablet 5 mg  5 mg Oral Daily Laverle Hobby, MD   5 mg at 10/16/15 0940  . fluPHENAZine decanoate (PROLIXIN) injection 25 mg  25 mg  Intramuscular Q14 Days Mikael Spray, NP   25 mg at 10/13/15 1712  . furosemide (LASIX) tablet 20 mg  20 mg Oral BID Laverle Hobby, MD      . hydrALAZINE (APRESOLINE) injection 10-40 mg  10-40 mg Intravenous Q4H PRN Mikael Spray, NP   20 mg at 10/16/15 0345  . insulin aspart (novoLOG) injection 0-5 Units  0-5 Units Subcutaneous QHS Sital Mody, MD      . insulin aspart (novoLOG) injection 0-9 Units  0-9 Units Subcutaneous TID WC Sital Mody, MD      . ipratropium-albuterol (DUONEB) 0.5-2.5 (3) MG/3ML nebulizer solution 3 mL  3 mL Nebulization Q6H Magadalene S Tukov, NP   3 mL at 10/16/15 1356  . LORazepam (ATIVAN) injection 1 mg  1 mg Intravenous Q4H PRN Wilhelmina Mcardle, MD   1 mg at 10/15/15 2136  . metoprolol (LOPRESSOR) injection 2.5-5 mg  2.5-5 mg Intravenous Q3H PRN Laverle Hobby, MD   5 mg at 10/16/15 0754  . morphine 2 MG/ML injection 2 mg  2 mg Intravenous Q2H PRN Mikael Spray, NP      . ondansetron (ZOFRAN) injection 4 mg  4 mg Intravenous Q6H PRN Mikael Spray, NP      . senna-docusate (Senokot-S) tablet 2 tablet  2 tablet Oral BID Laverle Hobby, MD   2 tablet at 10/16/15 0939  . traMADol (ULTRAM) tablet 50 mg  50 mg Oral Q6H PRN Mikael Spray, NP   50 mg at 10/15/15 1146  . verapamil (CALAN) tablet 120 mg  120 mg Oral Q12H Bettey Costa, MD   120 mg at 10/16/15 3810     Musculoskeletal: Strength & Muscle Tone: decreased Gait & Station: unable to stand Patient leans: N/A  Psychiatric Specialty Exam: Physical Exam  Constitutional: She appears well-developed and well-nourished. No distress.  HENT:  Head: Normocephalic and atraumatic.  Eyes: Conjunctivae are normal. Pupils are equal, round, and reactive to light.  Neck: Normal range of motion.  Cardiovascular: Normal heart sounds.   Respiratory: She is in respiratory distress.  GI: Soft.  Musculoskeletal: Normal range of motion.  Neurological: She is alert.  Skin: Skin is warm and dry.  Psychiatric: Her affect is blunt. Her speech is delayed. She is slowed. She expresses impulsivity. She expresses no suicidal ideation. She exhibits abnormal recent memory.    Review of Systems  Constitutional: Positive for malaise/fatigue.  HENT: Negative.   Eyes: Negative.   Respiratory: Positive for cough and shortness of breath.   Cardiovascular: Negative.   Gastrointestinal: Negative.   Musculoskeletal: Negative.   Skin: Negative.   Neurological: Positive for weakness.    Blood pressure (!) 149/71, pulse 80, temperature 98.4 F (36.9 C), temperature source Oral, resp. rate 19, height '5\' 7"'  (1.702 m), weight 130 kg (286 lb 9.6 oz), SpO2 100 %.Body mass index is 44.89 kg/m.  General Appearance: Casual  Eye Contact:  Fair  Speech:  Slow  Volume:  Decreased  Mood:  Anxious  Affect:  Congruent  Thought Process:  Goal Directed  Orientation:  Full (Time, Place, and Person)  Thought Content:  Illogical  Suicidal Thoughts:  No  Homicidal Thoughts:  No  Memory:  Immediate;   Fair Recent;   Poor Remote;   Fair  Judgement:  Fair  Insight:  Fair  Psychomotor Activity:  Decreased  Concentration:  Concentration: Poor  Recall:  [employer] of Knowledge:  Fair  Language:  Fair  Akathisia:  No  Handed:  Right  AIMS (if indicated):     Assets:  Financial Resources/Insurance Social Support  ADL's:   Impaired  Cognition:  Impaired,  Mild  Sleep:        Treatment Plan Summary: Daily contact with patient to assess and evaluate symptoms and progress in treatment, Medication management and Plan Doing well. Continue current medication. She is returning back to her normal baseline. I would not anticipate the need for inpatient psychiatric treatment but for return to her outpatient living situation once she is medically well.  Disposition: Patient does not meet criteria for psychiatric inpatient admission. Supportive therapy provided about ongoing stressors.  Alethia Berthold, MD 10/16/2015 3:57 PM

## 2015-10-16 NOTE — Progress Notes (Signed)
Patient transferred into room 252. Telemetry box verified with Gerald Stabs NT. Skin checked with Denny Peon RN. Respiratory setting up high flow nasal canula. Patient resting quietly, no complaints. Will continue to monitor.

## 2015-10-16 NOTE — Progress Notes (Signed)
Pt refuses Bipap

## 2015-10-16 NOTE — Progress Notes (Signed)
Patient will transfer to 2A. Room 252.  Report called to [facility].  Pt will transfer after she eats lunch

## 2015-10-16 NOTE — Progress Notes (Signed)
Per Dr Ashby Dawes, no ABG needed today

## 2015-10-16 NOTE — Progress Notes (Signed)
[place] [facility] [place] NAME: [name] [name]    MR#:  [id number]  DATE OF BIRTH:  [birth year]  SUBJECTIVE:   No acute events overnight. Patient is on high flow oxygen. As per nursing no agitation overnight.  REVIEW OF SYSTEMS:    Review of Systems  Unable to perform ROS: Medical condition    Tolerating Diet: yes      DRUG ALLERGIES:   Allergies  Allergen Reactions  . Haldol [Haloperidol Decanoate] Swelling and Other (See Comments)    Reaction:  Swelling of tongue and blurred vision    . Metformin Diarrhea  . Prednisone Other (See Comments)    Unknown reaction  . Raspberry Swelling and Other (See Comments)    Reaction:  Swelling of lips     VITALS:  Blood pressure 134/81, pulse 71, temperature 97.7 F (36.5 C), temperature source Axillary, resp. rate 20, height 5\' 7"  (1.702 m), weight 130 kg (286 lb 9.6 oz), SpO2 98 %.  PHYSICAL EXAMINATION:   Physical Exam  Constitutional: No distress.  Overweight Patient wearing high flow mask  HENT:  Head: Normocephalic.  Eyes: No scleral icterus.  Neck: No JVD present. No tracheal deviation present.  Cardiovascular: Normal rate, regular rhythm and normal heart sounds.  Exam reveals no gallop and no friction rub.   No murmur heard. Pulmonary/Chest: Effort normal. No respiratory distress. She has no wheezes. She has no rales. She exhibits no tenderness.  Bilateral rhonchi  Abdominal: Soft. Bowel sounds are normal. She exhibits no distension and no mass. There is no tenderness. There is no rebound and no guarding.  Musculoskeletal: She exhibits edema and tenderness. She exhibits no deformity.  Neurological:  Sleepy she awakes but does not want to answer questions  Skin: Skin is warm. No rash noted. No erythema.      LABORATORY PANEL:   CBC  Recent Labs Lab 10/16/15 0530  WBC 15.6*  HGB 14.2  HCT 43.2  PLT 237    ------------------------------------------------------------------------------------------------------------------  Chemistries   Recent Labs Lab 10/12/15 2032  10/15/15 0411 10/16/15 0530  NA 142  < > 147* 146*  K 4.5  < > 4.2 3.5  CL 102  < > 106 106  CO2 35*  < > 34* 31  GLUCOSE 143*  < > 137* 140*  BUN 16  < > 20 22*  CREATININE 0.82  < > 0.85 0.75  CALCIUM 8.8*  < > 9.1 9.1  MG  --   < > 2.0  --   AST 21  --   --   --   ALT 16  --   --   --   ALKPHOS 73  --   --   --   BILITOT 0.2*  --   --   --   < > = values in this interval not displayed. ------------------------------------------------------------------------------------------------------------------  Cardiac Enzymes  Recent Labs Lab 10/13/15 0250 10/13/15 0956 10/13/15 1721  TROPONINI <0.03 <0.03 <0.03   ------------------------------------------------------------------------------------------------------------------  RADIOLOGY:  No results found.   ASSESSMENT AND PLAN:   70 year old female with a history of COPD, obesity hypoventilation who not compliant with BiPAP who presented with hypercarbic respiratory failure and uncontrolled hypertension complicated by severe aggression and agitation due to underlying schizoaffective disorder.  1. Acute on chronic hypercarbic respiratory failure with obesity hypoventilation syndrome: This is due to noncompliance with BiPAP. Continue BiPAP at night. Continue Lasix. Continue nebulizer treatments.  2. Schizoaffective disorder: Management as per psychiatry. Continue  Depakote and Prolixin.   3. Accelerated hypertension: Continue verapamil and Coreg (dose increased today)  4. Acute on chronic diastolic heart failure: IV Lasix which oral Lasix by pulmonary today.  5. Diabetes hemoglobin A1c of 7.6. Continue sliding scale insulin. Diabetes coordinator consult  D/w nurse and dr ram  CODE STATUS: dnr  TOTAL TIME TAKING CARE OF THIS PATIENT: 30 minutes.      POSSIBLE D/C 2-3 days to group home, DEPENDING ON CLINICAL CONDITION.   Contrell Ballentine M.D on 10/16/2015 at 1:42 PM  Between 7am to 6pm - Pager - [phone] After 6pm go to [email or web address] - password [email or web address] [facility]  Office  [phone]  CC: Primary care physician; Alvester Chou, NP  Note: This dictation was prepared with Dragon dictation along with smaller phrase technology. Any transcriptional errors that result from this process are unintentional.

## 2015-10-16 NOTE — Progress Notes (Signed)
Inpatient Diabetes Program Recommendations  AACE/ADA: New Consensus Statement on Inpatient Glycemic Control (2015)  Target Ranges:  Prepandial:   less than 140 mg/dL      Peak postprandial:   less than 180 mg/dL (1-2 hours)      Critically ill patients:  140 - 180 mg/dL   Lab Results  Component Value Date   GLUCAP 139 (H) 10/16/2015   HGBA1C 7.6 (H) 10/13/2015    Review of Glycemic Control  Results for TA, GAME (MRN [id number]) as of 10/16/2015 15:33  Ref. Range 10/15/2015 11:37 10/15/2015 17:20 10/16/2015 00:20 10/16/2015 05:44 10/16/2015 12:28  Glucose-Capillary Latest Ref Range: 65 - 99 mg/dL 135 (H) 154 (H) 154 (H) 140 (H) 139 (H)    Diabetes history: Type 2 Outpatient Diabetes medications: Glucotrol 10mg  bid Current orders for Inpatient glycemic control: Novolog 0-9 units tid, Novolog 0-5 units qhs, Lantus 18 units qhs, Januvia 100mg  qhs,   Inpatient Diabetes Program Recommendations: Noted consult to diabetes coordinator - agree with current orders for blood sugar management.  Gentry Fitz, RN, BA, MHA, CDE Diabetes Coordinator Inpatient Diabetes Program  [phone] (Team Pager) [phone] ([place]) 10/16/2015 3:44 PM

## 2015-10-16 NOTE — Progress Notes (Signed)
[facility] Progess Note    ASSESSMENT/PLAN    DISCUSSION: 70 yo AA female with a h/o COPD, bronchitis, CHF and right renal mass(benign?) presenting with acute on chronic  hypercarbic respiratory failure, uncontrolled hypertension, complicated by severe aggression/agitation due to psych disorder.   ASSESSMENT / PLAN:  NEUROLOGIC/Psych A:   Delirium/confusion/aggression-Psych following.  Acute encephalopathy likely due to hypercarbia improved.  -Patient appears more sleepy today.  P:   Continue prolixin IM. Depakon.   Ativan prn.     PULMONARY A: Acute on chronic hypercarbic respiratory failure. Pt is often non-compliant with bipap use.  H/O COPD H/o Bronchitis P:   -Continue BiPAP qhs as tolerated by patient.   -ABG on 8/22 7.43/60/70/39.8; consistent with compensated hypercapnic respiratory failure. -Nebulized steroids and bronchodilator -Lasix twice a day.   CARDIOVASCULAR A:  Uncontrolled hypertension Sinus tachycardia Lower extremity edema H/o LV outflow obstruction H/O CHF-Last 2 D ECHO WAS IN FEB 2016 CAD P:  --Diurese conservatively. -Hemodynamics per ICU protocol -Continue scheduled vasotec IV, increase coreg today for htn and tachycardia.    STUDIES:  2-D ECHO 08/21>   RENAL A:   Right renal mass-last biopsy was 05/15/2015 with results negative for malignancy P:   -F/U with Dr Barbie Banner at [place] creatinine -Monitor and correct electrolyte imbalances  GASTROINTESTINAL A:   H/O GERD P:   -Advance diet.  -Famotidine 20mg  bid  HEMATOLOGIC A:   No acute issues P:  -Heparin for VTE prophylaxis  ENDOCRINE A:   Type 2 DM   P:   -Lantus 10 units QHS -Blood glucose monitoring with sliding scale insulin coverage   CODE STATUS: DNR  SIGNIFICANT EVENTS: 08/20>ED with acute dyspnea 8/21> delirium, placed on precedex, psych consulted.    ---------------------------------------   ----------------------------------------   Name: [name] [name] MRN: [id number] DOB: 1945-12-24    ADMISSION DATE:  10/12/2015    SUBJECTIVE:   Pt currently confused/sedated can not provide history or review of systems.   Review of Systems:  --   VITAL SIGNS: Temp:  [97.6 F (36.4 C)-99.1 F (37.3 C)] 99.1 F (37.3 C) (08/24 0800) Pulse Rate:  [85-126] 107 (08/24 0800) Resp:  [15-37] 27 (08/24 0800) BP: (107-187)/(52-111) 157/68 (08/24 0800) SpO2:  [93 %-99 %] 94 % (08/24 0800) FiO2 (%):  [24 %-43 %] 43 % (08/24 0800) Weight:  [286 lb 9.6 oz (130 kg)] 286 lb 9.6 oz (130 kg) (08/24 0555) HEMODYNAMICS:   VENTILATOR SETTINGS: FiO2 (%):  [24 %-43 %] 43 % INTAKE / OUTPUT:  Intake/Output Summary (Last 24 hours) at 10/16/15 0812 Last data filed at 10/16/15 0741  Gross per 24 hour  Intake          1797.17 ml  Output              800 ml  Net           997.17 ml    PHYSICAL EXAMINATION: Physical Examination:   VS: BP (!) 157/68 (BP Location: Left Arm)   Pulse (!) 107   Temp 99.1 F (37.3 C) (Axillary)   Resp (!) 27   Ht 5\' 7"  (1.702 m)   Wt 286 lb 9.6 oz (130 kg)   SpO2 94%   BMI 44.89 kg/m   General Appearance: No distress  Neuro:without focal findings, mental status reduced HEENT: PERRLA, EOM intact. Pulmonary: normal breath sounds   CardiovascularNormal S1,S2.  No m/r/g.   Abdomen: Benign, Soft, non-tender. Renal:  No costovertebral tenderness  GU:  Not performed at this time. Endocrine: No evident thyromegaly. Skin:   warm, no rashes, no ecchymosis  Extremities: normal, no cyanosis, clubbing.   LABS:   LABORATORY PANEL:   CBC  Recent Labs Lab 10/16/15 0530  WBC 15.6*  HGB 14.2  HCT 43.2  PLT 237    Chemistries   Recent Labs Lab 10/12/15 2032  10/13/15 0537  10/15/15 0411 10/16/15 0530  NA 142  --  142  < > 147* 146*  K 4.5  --  4.9  < > 4.2 3.5  CL 102  --  102  < > 106 106  CO2  35*  --  35*  < > 34* 31  GLUCOSE 143*  --  115*  < > 137* 140*  BUN 16  --  13  < > 20 22*  CREATININE 0.82  --  0.74  < > 0.85 0.75  CALCIUM 8.8*  --  8.9  < > 9.1 9.1  MG  --   < > 2.1  --  2.0  --   PHOS  --   --  4.0  --   --   --   AST 21  --   --   --   --   --   ALT 16  --   --   --   --   --   ALKPHOS 73  --   --   --   --   --   BILITOT 0.2*  --   --   --   --   --   < > = values in this interval not displayed.   Recent Labs Lab 10/15/15 0011 10/15/15 0529 10/15/15 1137 10/15/15 1720 10/16/15 0020 10/16/15 0544  GLUCAP 118* 145* 135* 154* 154* 140*    Recent Labs Lab 10/13/15 0250 10/13/15 1140 10/14/15 0526  PHART 7.33* 7.35 7.43  PCO2ART 68* 71* 60*  PO2ART 144* 70* 70*    Recent Labs Lab 10/12/15 2032  AST 21  ALT 16  ALKPHOS 73  BILITOT 0.2*  ALBUMIN 3.7    Cardiac Enzymes  Recent Labs Lab 10/13/15 1721  TROPONINI <0.03    RADIOLOGY:  No results found.     Marda Stalker, MD.  ICU Pager: [phone] [facility] Office Number: [phone]  Patricia Pesa, M.D.  Vilinda Boehringer, M.D.  Merton Border, M.D  10/16/2015   Critical Care Attestation.  I have personally obtained a history, examined the patient, evaluated laboratory and imaging results, formulated the assessment and plan and placed orders. The Patient requires high complexity decision making for assessment and support, frequent evaluation and titration of therapies, application of advanced monitoring technologies and extensive interpretation of multiple databases. The patient has critical illness that could lead imminently to failure of 1 or more organ systems and requires the highest level of physician preparedness to intervene.  Critical Care Time devoted to patient care services described in this note is 35 minutes and is exclusive of time spent in procedures.

## 2015-10-16 NOTE — Consult Note (Signed)
PT appeared fatigued and groggy w/minimal verbal communication. CH initiated dialogue and provided pastoral prayer as requested by PT.  [name] [name] Safir Michalec/[phone]

## 2015-10-17 LAB — BASIC METABOLIC PANEL
ANION GAP: 7 (ref 5–15)
BUN: 30 mg/dL — ABNORMAL HIGH (ref 6–20)
CHLORIDE: 106 mmol/L (ref 101–111)
CO2: 33 mmol/L — AB (ref 22–32)
Calcium: 8.6 mg/dL — ABNORMAL LOW (ref 8.9–10.3)
Creatinine, Ser: 1.58 mg/dL — ABNORMAL HIGH (ref 0.44–1.00)
GFR calc non Af Amer: 32 mL/min — ABNORMAL LOW (ref >60)
GFR, EST AFRICAN AMERICAN: 37 mL/min — AB (ref >60)
Glucose, Bld: 144 mg/dL — ABNORMAL HIGH (ref 65–99)
Potassium: 3.6 mmol/L (ref 3.5–5.1)
Sodium: 146 mmol/L — ABNORMAL HIGH (ref 135–145)

## 2015-10-17 LAB — CBC
HEMATOCRIT: 38.7 % (ref 35.0–47.0)
HEMOGLOBIN: 12.7 g/dL (ref 12.0–16.0)
MCH: 28.6 pg (ref 26.0–34.0)
MCHC: 32.8 g/dL (ref 32.0–36.0)
MCV: 87 fL (ref 80.0–100.0)
Platelets: 205 10*3/uL (ref 150–440)
RBC: 4.44 MIL/uL (ref 3.80–5.20)
RDW: 14.2 % (ref 11.5–14.5)
WBC: 13.7 10*3/uL — AB (ref 3.6–11.0)

## 2015-10-17 LAB — GLUCOSE, CAPILLARY
GLUCOSE-CAPILLARY: 137 mg/dL — AB (ref 65–99)
GLUCOSE-CAPILLARY: 131 mg/dL — AB (ref 65–99)
Glucose-Capillary: 112 mg/dL — ABNORMAL HIGH (ref 65–99)
Glucose-Capillary: 124 mg/dL — ABNORMAL HIGH (ref 65–99)

## 2015-10-17 MED ORDER — FAMOTIDINE 20 MG PO TABS
20.0000 mg | ORAL_TABLET | Freq: Two times a day (BID) | ORAL | Status: DC
  Administered 2015-10-17 – 2015-10-22 (×11): 20 mg via ORAL
  Filled 2015-10-17 (×12): qty 1

## 2015-10-17 MED ORDER — LORAZEPAM 2 MG/ML IJ SOLN
0.5000 mg | INTRAMUSCULAR | Status: DC | PRN
  Administered 2015-10-17: 0.5 mg via INTRAVENOUS
  Filled 2015-10-17: qty 1

## 2015-10-17 NOTE — Care Management Important Message (Signed)
Important Message  Patient Details  Name: [name] [name] MRN: [id number] Date of Birth: 1946-01-20   Medicare Important Message Given:  Yes    Jolly Mango, RN 10/17/2015, 9:02 AM

## 2015-10-17 NOTE — Progress Notes (Signed)
Chaplain rounded the unit to provide a compassionate presence and support to the patient. Patient seemed to be sleeping comfortably. Silent prayer was proved. Minerva Fester [phone]

## 2015-10-17 NOTE — Progress Notes (Signed)
Dr. Tressia Miners aware of BP, instructed just to give the verapamil and hold coreg.

## 2015-10-17 NOTE — Clinical Social Work Placement (Addendum)
   CLINICAL SOCIAL WORK PLACEMENT  NOTE  Date:  10/17/2015  Patient Details  Name: [name] [name] MRN: [id number] Date of Birth: 09-21-1945  Clinical Social Work is seeking post-discharge placement for this patient at the [place] level of care (*CSW will initial, date and re-position this form in  chart as items are completed):  Yes   Patient/family provided with [facility] Work Department's list of facilities offering this level of care within the geographic area requested by the patient (or if unable, by the patient's family).  Yes   Patient/family informed of their freedom to choose among providers that offer the needed level of care, that participate in Medicare, Medicaid or managed care program needed by the patient, have an available bed and are willing to accept the patient.  Yes   Patient/family informed of [facility]'s ownership interest in [facility] and [facility], as well as of the fact that they are under no obligation to receive care at these facilities.  PASRR submitted to EDS on 10/17/15     PASRR number received on       Existing PASRR number confirmed on       FL2 transmitted to all facilities in geographic area requested by pt/family on 10/17/15     FL2 transmitted to all facilities within larger geographic area on       Patient informed that his/her managed care company has contracts with or will negotiate with certain facilities, including the following:        Yes   Patient/family informed of bed offers received.  Patient chooses bed at      Physician recommends and patient chooses bed at      Patient to be transferred to   on  .  Patient to be transferred to facility by       Patient family notified on   of transfer.  Name of family member notified:        PHYSICIAN Please sign FL2     Additional Comment:    _______________________________________________ Ross Ludwig 10/17/2015, 5:37 PM

## 2015-10-17 NOTE — Progress Notes (Signed)
CONCERNING: IV to Oral Route Change Policy  RECOMMENDATION: This patient is receiving famotidine by the intravenous route.  Based on criteria approved by the Pharmacy and Therapeutics Committee, the intravenous medication(s) is/are being converted to the equivalent oral dose form(s).   DESCRIPTION: These criteria include:  The patient is eating (either orally or via tube) and/or has been taking other orally administered medications for a least 24 hours  The patient has no evidence of active gastrointestinal bleeding or impaired GI absorption (gastrectomy, short bowel, patient on TNA or NPO).  If you have questions about this conversion, please contact the Pharmacy Department  []   [phone] )  [facility] [x]   [phone] )  [facility] []   [phone] )  [facility] []   [phone] )  [facility] []   [phone] )  [place], [facility] 10/17/2015 8:09 AM

## 2015-10-17 NOTE — Clinical Social Work Note (Signed)
MSW contacted patient's son [name] at [phone] to present bed offers.  Patient son would like her to go to [facility].  MSW spoke to [facility] and they can accept patient next week if she is medically ready for discharge, orders have been received, and insurance authorization has been given.  MSW to continue to follow patient's progress throughout discharge planning.  Jones Broom. Consuelo Suthers, MSW [phone]  Mon-Fri 8a-4:30p 10/17/2015 5:03 PM

## 2015-10-17 NOTE — NC FL2 (Signed)
[place] LEVEL OF CARE SCREENING TOOL     IDENTIFICATION  Patient Name: [name] [name] Birthdate: 27-Mar-1945 Sex: female Admission Date (Current Location): 10/12/2015  [place] and [place] Number:  Engineering geologist and Address:  [facility], [address] [place], [place] [address]      Provider Number: [id number]  Attending Physician Name and Address:  Gladstone Lighter, MD  Relative Name and Phone Number:  [name] [phone] or Dorene Grebe [phone]     Current Level of Care: Hospital Recommended Level of Care: [place] Prior Approval Number:    Date Approved/Denied:   PASRR Number: Pending  Discharge Plan: SNF    Current Diagnoses: Patient Active Problem List   Diagnosis Date Noted  . Acute respiratory failure ([place]) 10/13/2015  . Involuntary commitment 09/02/2015  . Schizoaffective disorder, bipolar type ([place])   . Urinary incontinence 10/30/2014  . GERD (gastroesophageal reflux disease) 10/30/2014  . OSA (obstructive sleep apnea) 10/30/2014  . Osteoarthrosis, unspecified whether generalized or localized, involving lower leg 10/30/2014  . COPD (chronic obstructive pulmonary disease) ([place]) 10/30/2014  . Chronic diastolic CHF (congestive heart failure) ([place]) 05/15/2014  . Obesity   . History of colon polyps 03/09/2013  . Renal mass 06/26/2011  . Lytic bone lesion of hip 06/25/2011  . Hypertension 06/24/2011  . Diabetes mellitus ([place]) 06/24/2011    Orientation RESPIRATION BLADDER Height & Weight     Self, Place  O2 (High Flow Nasal Cannula) Continent Weight: 278 lb 9.6 oz (126.4 kg) Height:  5\' 7"  (170.2 cm)  BEHAVIORAL SYMPTOMS/MOOD NEUROLOGICAL BOWEL NUTRITION STATUS  Verbally abusive   Incontinent Diet (Carb Modified)  AMBULATORY STATUS COMMUNICATION OF NEEDS Skin   Limited Assist Verbally Normal                       Personal Care Assistance Level of Assistance   Bathing, Dressing Bathing Assistance: Limited assistance   Dressing Assistance: Limited assistance     Functional Limitations Info  Sight, Hearing, Speech Sight Info: Adequate Hearing Info: Adequate Speech Info: Adequate    SPECIAL CARE FACTORS FREQUENCY  PT (By licensed PT)     PT Frequency: 5x a week              Contractures Contractures Info: Not present    Additional Factors Info  Isolation Precautions, Allergies   Allergies Info: HALDOL HALOPERIDOL DECANOATE, METFORMIN, PREDNISONE, RASPBERRY      Isolation Precautions Info: ESBL     Current Medications (10/17/2015):  This is the current hospital active medication list Current Facility-Administered Medications  Medication Dose Route Frequency Provider Last Rate Last Dose  . 0.9 %  sodium chloride infusion   Intravenous Continuous Holley Raring, NP   Stopped at 10/15/15 1314  . acetaminophen (TYLENOL) tablet 650 mg  650 mg Oral Q4H PRN Mikael Spray, NP      . antiseptic oral rinse (CPC / CETYLPYRIDINIUM CHLORIDE 0.05%) solution 7 mL  7 mL Mouth Rinse q12n4p Mikael Spray, NP   7 mL at 10/16/15 1600  . aspirin EC tablet 81 mg  81 mg Oral Daily Mikael Spray, NP   81 mg at 10/17/15 [id number]  . atorvastatin (LIPITOR) tablet 20 mg  20 mg Oral QHS Mikael Spray, NP   20 mg at 10/16/15 2350  . carvedilol (COREG) tablet 12.5 mg  12.5 mg Oral BID Laverle Hobby, MD   12.5 mg at 10/16/15 2350  .  chlorhexidine (PERIDEX) 0.12 % solution 15 mL  15 mL Mouth Rinse BID Mikael Spray, NP   15 mL at 10/16/15 2351  . dicyclomine (BENTYL) tablet 20 mg  20 mg Oral TID PRN Mikael Spray, NP      . divalproex (DEPAKOTE) DR tablet 500 mg  500 mg Oral Q12H Gonzella Lex, MD   500 mg at 10/17/15 [id number]  . enoxaparin (LOVENOX) injection 40 mg  40 mg Subcutaneous Q12H Laverle Hobby, MD   40 mg at 10/17/15 0917  . famotidine (PEPCID) tablet 20 mg  20 mg Oral BID Gladstone Lighter, MD   20 mg at  10/17/15 [id number]  . fluPHENAZine (PROLIXIN) tablet 15 mg  15 mg Oral QHS Sheema M Hallaji, RPH   15 mg at 10/16/15 2349  . fluPHENAZine (PROLIXIN) tablet 5 mg  5 mg Oral Daily Laverle Hobby, MD   5 mg at 10/17/15 0918  . fluPHENAZine decanoate (PROLIXIN) injection 25 mg  25 mg Intramuscular Q14 Days Mikael Spray, NP   25 mg at 10/13/15 1712  . furosemide (LASIX) tablet 20 mg  20 mg Oral BID Laverle Hobby, MD   20 mg at 10/17/15 0917  . hydrALAZINE (APRESOLINE) injection 10-40 mg  10-40 mg Intravenous Q4H PRN Mikael Spray, NP   20 mg at 10/16/15 0345  . insulin aspart (novoLOG) injection 0-5 Units  0-5 Units Subcutaneous QHS Sital Mody, MD      . insulin aspart (novoLOG) injection 0-9 Units  0-9 Units Subcutaneous TID WC Bettey Costa, MD   1 Units at 10/17/15 1218  . ipratropium-albuterol (DUONEB) 0.5-2.5 (3) MG/3ML nebulizer solution 3 mL  3 mL Nebulization Q6H Mikael Spray, NP   3 mL at 10/17/15 0814  . LORazepam (ATIVAN) injection 0.5 mg  0.5 mg Intravenous Q4H PRN Lance Coon, MD   0.5 mg at 10/17/15 0027  . metoprolol (LOPRESSOR) injection 2.5-5 mg  2.5-5 mg Intravenous Q3H PRN Laverle Hobby, MD   5 mg at 10/16/15 0754  . morphine 2 MG/ML injection 2 mg  2 mg Intravenous Q2H PRN Mikael Spray, NP      . ondansetron (ZOFRAN) injection 4 mg  4 mg Intravenous Q6H PRN Mikael Spray, NP      . senna-docusate (Senokot-S) tablet 2 tablet  2 tablet Oral BID Laverle Hobby, MD   2 tablet at 10/16/15 2349  . traMADol (ULTRAM) tablet 50 mg  50 mg Oral Q6H PRN Mikael Spray, NP   50 mg at 10/15/15 1146  . verapamil (CALAN) tablet 120 mg  120 mg Oral Q12H Bettey Costa, MD   120 mg at 10/17/15 1217     Discharge Medications: Please see discharge summary for a list of discharge medications.  Relevant Imaging Results:  Relevant Lab Results:   Additional Information SSN [id number]  Ross Ludwig

## 2015-10-17 NOTE — Progress Notes (Addendum)
Dr. Tressia Miners rounding. Aware of low BP. Instructed to give PO lasix now and save verapamil and coreg for 1100 and reassess BP then. Aware of CO2 level. Baseline lethargy/drowsiness per son. Son has spoken to Dr. Tressia Miners.

## 2015-10-17 NOTE — Progress Notes (Signed)
CBG 131 per Gerald Stabs NT. Glucometer not synched yet.

## 2015-10-17 NOTE — Progress Notes (Signed)
PT Cancellation Note  Patient Details Name: [name] [name] MRN: [id number] DOB: 01/16/1946   Cancelled Treatment:    Reason Eval/Treat Not Completed: Fatigue/lethargy limiting ability to participate;Medical issues which prohibited therapy   Chart reviewed.  Pt awake initially upon entering room talking about her brothers visit but quickly drifted off to sleep.  Attempted and encouraged supine A/AAROM but she was unable to remain awake in order to actively engage.  Will continue as appropriate.  Discussed with primary nurse.   Chesley Noon 10/17/2015, 10:31 AM

## 2015-10-17 NOTE — Consult Note (Signed)
  Psychiatry: Brief follow-up. Patient continues to say she is doing wonderful. Her son brought her some food that she preferred today which clearly made her happier. Her behavior has been calm and appropriate. She is agreeable to going to rehabilitation and then hopefully on to [facility]. No change to psychiatric medicine or treatment. Supportive counseling completed again today.

## 2015-10-17 NOTE — Progress Notes (Signed)
[facility] NAME: [name] [name]    MR#:  [id number]  DATE OF BIRTH:  1945-11-21  SUBJECTIVE:  CHIEF COMPLAINT:   Chief Complaint  Patient presents with  . Shortness of Breath   -Patient sleepy, when aroused states that she is feeling wonderful. -Aid at bedside feeding her. -Discussed with son.  REVIEW OF SYSTEMS:  Review of Systems  Unable to perform ROS: Psychiatric disorder    DRUG ALLERGIES:   Allergies  Allergen Reactions  . Haldol [Haloperidol Decanoate] Swelling and Other (See Comments)    Reaction:  Swelling of tongue and blurred vision    . Metformin Diarrhea  . Prednisone Other (See Comments)    Unknown reaction  . Raspberry Swelling and Other (See Comments)    Reaction:  Swelling of lips     VITALS:  Blood pressure (!) 113/52, pulse 89, temperature 99 F (37.2 C), temperature source Oral, resp. rate 20, height 5\' 7"  (1.702 m), weight 126.4 kg (278 lb 9.6 oz), SpO2 91 %.  PHYSICAL EXAMINATION:  Physical Exam  GENERAL:  70 y.o.-year-old patient lying in the bed with no acute distress. Sleepy, deconditioned EYES: Pupils equal, round, reactive to light and accommodation. No scleral icterus. Extraocular muscles intact. Right eye more erythematous conjunctiva HEENT: Head atraumatic, normocephalic. Oropharynx and nasopharynx clear.  NECK:  Supple, no jugular venous distention. No thyroid enlargement, no tenderness.  LUNGS: Normal breath sounds bilaterally, no wheezing, rales,rhonchi or crepitation. No use of accessory muscles of respiration. Decreased bibasilar breath sounds. CARDIOVASCULAR: S1, S2 normal. No murmurs, rubs, or gallops.  ABDOMEN: Soft, nontender, nondistended. Bowel sounds present. No organomegaly or mass.  EXTREMITIES: No  cyanosis, or clubbing. 2+ pedal edema present NEUROLOGIC: Cranial nerves II through XII are intact. Muscle strength 4/5 in all extremities. Sensation intact. Gait not  checked. Global weakness present PSYCHIATRIC: The patient is alert and oriented to self.  SKIN: No obvious rash, lesion, or ulcer.    LABORATORY PANEL:   CBC  Recent Labs Lab 10/17/15 0521  WBC 13.7*  HGB 12.7  HCT 38.7  PLT 205   ------------------------------------------------------------------------------------------------------------------  Chemistries   Recent Labs Lab 10/12/15 2032  10/15/15 0411  10/17/15 0521  NA 142  < > 147*  < > 146*  K 4.5  < > 4.2  < > 3.6  CL 102  < > 106  < > 106  CO2 35*  < > 34*  < > 33*  GLUCOSE 143*  < > 137*  < > 144*  BUN 16  < > 20  < > 30*  CREATININE 0.82  < > 0.85  < > 1.58*  CALCIUM 8.8*  < > 9.1  < > 8.6*  MG  --   < > 2.0  --   --   AST 21  --   --   --   --   ALT 16  --   --   --   --   ALKPHOS 73  --   --   --   --   BILITOT 0.2*  --   --   --   --   < > = values in this interval not displayed. ------------------------------------------------------------------------------------------------------------------  Cardiac Enzymes  Recent Labs Lab 10/13/15 1721  TROPONINI <0.03   ------------------------------------------------------------------------------------------------------------------  RADIOLOGY:  No results found.  EKG:   Orders placed or performed during the hospital encounter of 09/18/15  . ED EKG  . ED  EKG  . EKG 12-Lead  . EKG 12-Lead    ASSESSMENT AND PLAN:   70 year old female with past medical history significant for hypertrophic cardiomyopathy, diastolic CHF, COPD, schizophrenia and bipolar disorder presents to Hospital that respiratory failure. She was transferred from critical care unit to medical floor.  #1 acute on chronic respiratory failure-secondary to COPD and obesity hypoventilation. -Continue BiPAP at nighttime. On high flow nasal cannula this morning. Try to wean to nasal cannula. -On oral Lasix and also on nebulizer treatments  #2 schizoaffective disorder : management per  psychiatry -Continue Depakote and Prolixin  #3 hypertension-on verapamil and Coreg  #4 acute on chronic diastolic heart failure-Lasix changed to oral today  #5 CAD-continue cardiac medications. Appears stable.  #6 acute renal failure-could be overdiuresis. Hold Lasix as blood pressure is also low today. -Continue to monitor  #7 DVT prophylaxis-Lovenox     All the records are reviewed and case discussed with Care Management/Social Workerr. Management plans discussed with the patient, family and they are in agreement.  CODE STATUS: DO NOT RESUSCITATE  TOTAL TIME TAKING CARE OF THIS PATIENT: 37 minutes.   POSSIBLE D/C IN 2 DAYS, DEPENDING ON CLINICAL CONDITION.   Gladstone Lighter M.D on 10/17/2015 at 4:25 PM  Between 7am to 6pm - Pager - [phone]  After 6pm go to [email or web address] - password [email or web address] [facility]  Office  [phone]  CC: Primary care physician; Alvester Chou, NP

## 2015-10-18 LAB — BASIC METABOLIC PANEL
Anion gap: 6 (ref 5–15)
BUN: 42 mg/dL — AB (ref 6–20)
CALCIUM: 8.5 mg/dL — AB (ref 8.9–10.3)
CHLORIDE: 102 mmol/L (ref 101–111)
CO2: 33 mmol/L — AB (ref 22–32)
CREATININE: 1.15 mg/dL — AB (ref 0.44–1.00)
GFR calc Af Amer: 55 mL/min — ABNORMAL LOW (ref >60)
GFR calc non Af Amer: 47 mL/min — ABNORMAL LOW (ref >60)
GLUCOSE: 107 mg/dL — AB (ref 65–99)
Potassium: 3.6 mmol/L (ref 3.5–5.1)
Sodium: 141 mmol/L (ref 135–145)

## 2015-10-18 LAB — GLUCOSE, CAPILLARY
Glucose-Capillary: 107 mg/dL — ABNORMAL HIGH (ref 65–99)
Glucose-Capillary: 172 mg/dL — ABNORMAL HIGH (ref 65–99)
Glucose-Capillary: 122 mg/dL — ABNORMAL HIGH (ref 65–99)
Glucose-Capillary: 143 mg/dL — ABNORMAL HIGH (ref 65–99)

## 2015-10-18 NOTE — Progress Notes (Signed)
[facility] [place] [facility] [place] NAME: [name] [name]    MR#:  [id number]  DATE OF BIRTH:  Sep 18, 1945  SUBJECTIVE:  CHIEF COMPLAINT:   Chief Complaint  Patient presents with  . Shortness of Breath   -Patient seems to be at her baseline. Oriented to self. More interactive today -On high flow nasal cannula. Used BiPAP last night  REVIEW OF SYSTEMS:  Review of Systems  Unable to perform ROS: Psychiatric disorder    DRUG ALLERGIES:   Allergies  Allergen Reactions  . Haldol [Haloperidol Decanoate] Swelling and Other (See Comments)    Reaction:  Swelling of tongue and blurred vision    . Metformin Diarrhea  . Prednisone Other (See Comments)    Unknown reaction  . Raspberry Swelling and Other (See Comments)    Reaction:  Swelling of lips     VITALS:  Blood pressure (!) 102/55, pulse 73, temperature 99 F (37.2 C), temperature source Oral, resp. rate 18, height 5\' 7"  (1.702 m), weight 122.9 kg (271 lb), SpO2 98 %.  PHYSICAL EXAMINATION:  Physical Exam  GENERAL:  70 y.o.-year-old patient lying in the bed with no acute distress.  deconditioned EYES: Pupils equal, round, reactive to light and accommodation. No scleral icterus. Extraocular muscles intact.  HEENT: Head atraumatic, normocephalic. Oropharynx and nasopharynx clear.  NECK:  Supple, no jugular venous distention. No thyroid enlargement, no tenderness.  LUNGS: Normal breath sounds bilaterally, no wheezing, rales,rhonchi or crepitation. No use of accessory muscles of respiration. Decreased bibasilar breath sounds. CARDIOVASCULAR: S1, S2 normal. No murmurs, rubs, or gallops.  ABDOMEN: Soft, nontender, nondistended. Bowel sounds present. No organomegaly or mass.  EXTREMITIES: No  cyanosis, or clubbing. 1+ pedal edema present NEUROLOGIC: Cranial nerves II through XII are intact. Muscle strength 4/5 in all extremities. Sensation intact. Gait not checked. Global weakness present PSYCHIATRIC:  The patient is alert and oriented to self. More interacting today SKIN: No obvious rash, lesion, or ulcer.    LABORATORY PANEL:   CBC  Recent Labs Lab 10/17/15 0521  WBC 13.7*  HGB 12.7  HCT 38.7  PLT 205   ------------------------------------------------------------------------------------------------------------------  Chemistries   Recent Labs Lab 10/12/15 2032  10/15/15 0411  10/18/15 0754  NA 142  < > 147*  < > 141  K 4.5  < > 4.2  < > 3.6  CL 102  < > 106  < > 102  CO2 35*  < > 34*  < > 33*  GLUCOSE 143*  < > 137*  < > 107*  BUN 16  < > 20  < > 42*  CREATININE 0.82  < > 0.85  < > 1.15*  CALCIUM 8.8*  < > 9.1  < > 8.5*  MG  --   < > 2.0  --   --   AST 21  --   --   --   --   ALT 16  --   --   --   --   ALKPHOS 73  --   --   --   --   BILITOT 0.2*  --   --   --   --   < > = values in this interval not displayed. ------------------------------------------------------------------------------------------------------------------  Cardiac Enzymes  Recent Labs Lab 10/13/15 1721  TROPONINI <0.03   ------------------------------------------------------------------------------------------------------------------  RADIOLOGY:  No results found.  EKG:   Orders placed or performed during the hospital encounter of 09/18/15  . ED EKG  . ED  EKG  . EKG 12-Lead  . EKG 12-Lead    ASSESSMENT AND PLAN:   70 year old female with past medical history significant for hypertrophic cardiomyopathy, diastolic CHF, COPD, schizophrenia and bipolar disorder presents to Hospital that respiratory failure. She was transferred from critical care unit to medical floor.  #1 acute on chronic respiratory failure-secondary to COPD and obesity hypoventilation. -Continue BiPAP at nighttime. Change to CPAP at bedtime at discharge. - On high flow nasal cannula this morning. Try to wean to nasal cannula. -Held oral Lasix for acute renal failure and also on nebulizer treatments  #2  schizoaffective disorder : management per psychiatry -Continue Depakote and Prolixin -Seems to be at baseline  #3 hypertension-on verapamil and Coreg  #4 acute on chronic diastolic heart failure-Lasix held today  #5 CAD-continue cardiac medications. Appears stable.  #6 acute renal failure-could be overdiuresis. Held Lasix as blood pressure for today. -Continue to monitor -Improving  #7 DVT prophylaxis-Lovenox     All the records are reviewed and case discussed with Care Management/Social Workerr. Management plans discussed with the patient, family and they are in agreement.  CODE STATUS: DO NOT RESUSCITATE  TOTAL TIME TAKING CARE OF THIS PATIENT: 33 minutes.   POSSIBLE D/C TO SNF ON MONDAY, DEPENDING ON CLINICAL CONDITION.   Pantelis Elgersma M.D on 10/18/2015 at 9:01 AM  Between 7am to 6pm - Pager - [phone]  After 6pm go to [email or web address] - password [email or web address] [facility]  Office  [phone]  CC: Primary care physician; Alvester Chou, NP

## 2015-10-19 LAB — GLUCOSE, CAPILLARY
GLUCOSE-CAPILLARY: 116 mg/dL — AB (ref 65–99)
GLUCOSE-CAPILLARY: 120 mg/dL — AB (ref 65–99)
GLUCOSE-CAPILLARY: 235 mg/dL — AB (ref 65–99)
GLUCOSE-CAPILLARY: 87 mg/dL (ref 65–99)
Glucose-Capillary: 108 mg/dL — ABNORMAL HIGH (ref 65–99)
Glucose-Capillary: 117 mg/dL — ABNORMAL HIGH (ref 65–99)
Glucose-Capillary: 106 mg/dL — ABNORMAL HIGH (ref 65–99)
Glucose-Capillary: 133 mg/dL — ABNORMAL HIGH (ref 65–99)

## 2015-10-19 MED ORDER — FUROSEMIDE 20 MG PO TABS
20.0000 mg | ORAL_TABLET | Freq: Every day | ORAL | Status: DC
  Administered 2015-10-20 – 2015-10-22 (×3): 20 mg via ORAL
  Filled 2015-10-19 (×4): qty 1

## 2015-10-19 MED ORDER — ORAL CARE MOUTH RINSE: 15.0000 mL | Freq: Two times a day (BID) | OROMUCOSAL | Status: DC

## 2015-10-19 NOTE — Progress Notes (Signed)
Pt. Slept throughout the night waking twice for water, repositioned Q2H. Pt. Wore Bi-pap through the night. No signs or c/o pain. No acute distress noted. Will continue to monitor pt.

## 2015-10-19 NOTE — Progress Notes (Signed)
[facility] at [place] NAME: [name] [name]    MR#:  [id number]  DATE OF BIRTH:  [birth year]  SUBJECTIVE:  CHIEF COMPLAINT:   Chief Complaint  Patient presents with  . Shortness of Breath   -Patient seems to be at her baseline.sleeping, arousable -On 4L nasal cannula. Using BiPAP at night  REVIEW OF SYSTEMS:  Review of Systems  Unable to perform ROS: Psychiatric disorder    DRUG ALLERGIES:   Allergies  Allergen Reactions  . Haldol [Haloperidol Decanoate] Swelling and Other (See Comments)    Reaction:  Swelling of tongue and blurred vision    . Metformin Diarrhea  . Prednisone Other (See Comments)    Unknown reaction  . Raspberry Swelling and Other (See Comments)    Reaction:  Swelling of lips     VITALS:  Blood pressure (!) 143/91, pulse 72, temperature 98.9 F (37.2 C), temperature source Oral, resp. rate 18, height 5\' 7"  (1.702 m), weight 124.5 kg (274 lb 9 oz), SpO2 100 %.  PHYSICAL EXAMINATION:  Physical Exam  GENERAL:  70 y.o.-year-old patient lying in the bed with no acute distress.  deconditioned EYES: Pupils equal, round, reactive to light and accommodation. No scleral icterus. Extraocular muscles intact.  HEENT: Head atraumatic, normocephalic. Oropharynx and nasopharynx clear.  NECK:  Supple, no jugular venous distention. No thyroid enlargement, no tenderness.  LUNGS: Normal breath sounds bilaterally, no wheezing, rales,rhonchi or crepitation. No use of accessory muscles of respiration. Decreased bibasilar breath sounds. CARDIOVASCULAR: S1, S2 normal. No murmurs, rubs, or gallops.  ABDOMEN: Soft, nontender, nondistended. Bowel sounds present. No organomegaly or mass.  EXTREMITIES: No  cyanosis, or clubbing. 1+ pedal edema present NEUROLOGIC: Cranial nerves II through XII are intact. Muscle strength 3/5 in all extremities. Sensation intact. Gait not checked. Global weakness present PSYCHIATRIC: The patient is alert and  oriented to self. More interacting when aroused. SKIN: No obvious rash, lesion, or ulcer.    LABORATORY PANEL:   CBC  Recent Labs Lab 10/17/15 0521  WBC 13.7*  HGB 12.7  HCT 38.7  PLT 205   ------------------------------------------------------------------------------------------------------------------  Chemistries   Recent Labs Lab 10/12/15 2032  10/15/15 0411  10/18/15 0754  NA 142  < > 147*  < > 141  K 4.5  < > 4.2  < > 3.6  CL 102  < > 106  < > 102  CO2 35*  < > 34*  < > 33*  GLUCOSE 143*  < > 137*  < > 107*  BUN 16  < > 20  < > 42*  CREATININE 0.82  < > 0.85  < > 1.15*  CALCIUM 8.8*  < > 9.1  < > 8.5*  MG  --   < > 2.0  --   --   AST 21  --   --   --   --   ALT 16  --   --   --   --   ALKPHOS 73  --   --   --   --   BILITOT 0.2*  --   --   --   --   < > = values in this interval not displayed. ------------------------------------------------------------------------------------------------------------------  Cardiac Enzymes  Recent Labs Lab 10/13/15 1721  TROPONINI <0.03   ------------------------------------------------------------------------------------------------------------------  RADIOLOGY:  No results found.  EKG:   Orders placed or performed during the hospital encounter of 09/18/15  . ED EKG  . ED EKG  .  EKG 12-Lead  . EKG 12-Lead    ASSESSMENT AND PLAN:   70 year old female with past medical history significant for hypertrophic cardiomyopathy, diastolic CHF, COPD, schizophrenia and bipolar disorder presents to Hospital that respiratory failure. She was transferred from critical care unit to medical floor.  #1 acute on chronic respiratory failure-secondary to COPD and obesity hypoventilation. -Continue BiPAP at nighttime. Change to CPAP at bedtime at discharge. -on 4l  nasal cannula. -Held oral Lasix for acute renal failure and also on nebulizer treatments- restart lasix tomorrow  #2 schizoaffective disorder : management per  psychiatry -Continue Depakote and Prolixin -Seems to be at baseline  #3 hypertension-on verapamil and Coreg  #4 acute on chronic diastolic heart failure-Lasix held today  #5 CAD-continue cardiac medications. Appears stable.  #6 acute renal failure-could be overdiuresis. Held Lasix as blood pressure for now -Continue to monitor -Improving  #7 DVT prophylaxis-Lovenox    All the records are reviewed and case discussed with Care Management/Social Workerr. Management plans discussed with the patient, family and they are in agreement.  CODE STATUS: DO NOT RESUSCITATE  TOTAL TIME TAKING CARE OF THIS PATIENT: 33 minutes.   POSSIBLE D/C TO SNF ON MONDAY, DEPENDING ON CLINICAL CONDITION.   Gladstone Lighter M.D on 10/19/2015 at 12:01 PM  Between 7am to 6pm - Pager - [phone]  After 6pm go to [email or web address] - password [email or web address] [facility]  Office  [phone]  CC: Primary care physician; Alvester Chou, NP

## 2015-10-20 LAB — GLUCOSE, CAPILLARY
GLUCOSE-CAPILLARY: 106 mg/dL — AB (ref 65–99)
GLUCOSE-CAPILLARY: 127 mg/dL — AB (ref 65–99)
GLUCOSE-CAPILLARY: 132 mg/dL — AB (ref 65–99)
GLUCOSE-CAPILLARY: 97 mg/dL (ref 65–99)
Glucose-Capillary: 164 mg/dL — ABNORMAL HIGH (ref 65–99)

## 2015-10-20 LAB — BASIC METABOLIC PANEL
Anion gap: 5 (ref 5–15)
BUN: 26 mg/dL — AB (ref 6–20)
CHLORIDE: 100 mmol/L — AB (ref 101–111)
CO2: 36 mmol/L — ABNORMAL HIGH (ref 22–32)
Calcium: 8.7 mg/dL — ABNORMAL LOW (ref 8.9–10.3)
Creatinine, Ser: 0.79 mg/dL (ref 0.44–1.00)
GFR calc Af Amer: >60 mL/min (ref >60)
GFR calc non Af Amer: >60 mL/min (ref >60)
Glucose, Bld: 116 mg/dL — ABNORMAL HIGH (ref 65–99)
POTASSIUM: 4.2 mmol/L (ref 3.5–5.1)
SODIUM: 141 mmol/L (ref 135–145)

## 2015-10-20 LAB — BLOOD GAS, ARTERIAL
ACID-BASE EXCESS: 10.8 mmol/L — AB (ref 0.0–3.0)
Allens test (pass/fail): POSITIVE — AB
Bicarbonate: 39.2 mEq/L — ABNORMAL HIGH (ref 21.0–28.0)
EXPIRATORY PAP: 8
FIO2: 0.24
Inspiratory PAP: 16
LHR: 12 {breaths}/min
MODE: POSITIVE
O2 Saturation: 92.9 %
PCO2 ART: 71 mmHg — AB (ref 32.0–48.0)
PH ART: 7.35 (ref 7.350–7.450)
Patient temperature: 37
pO2, Arterial: 70 mmHg — ABNORMAL LOW (ref 83.0–108.0)

## 2015-10-20 MED ORDER — ACETAMINOPHEN 325 MG PO TABS: 650.0000 mg | ORAL_TABLET | ORAL | 2 refills | Status: AC | PRN

## 2015-10-20 MED ORDER — LORAZEPAM 0.5 MG PO TABS: 0.5000 mg | ORAL_TABLET | Freq: Three times a day (TID) | ORAL | 0 refills | Status: DC | PRN

## 2015-10-20 MED ORDER — CARVEDILOL 12.5 MG PO TABS: 12.5000 mg | ORAL_TABLET | Freq: Two times a day (BID) | ORAL | 2 refills | Status: DC

## 2015-10-20 MED ORDER — FUROSEMIDE 20 MG PO TABS: 20.0000 mg | ORAL_TABLET | Freq: Every day | ORAL | 0 refills | Status: DC

## 2015-10-20 MED ORDER — DIVALPROEX SODIUM 500 MG PO DR TAB: 500.0000 mg | DELAYED_RELEASE_TABLET | Freq: Two times a day (BID) | ORAL | 2 refills | Status: DC

## 2015-10-20 MED ORDER — TRAMADOL HCL 50 MG PO TABS: 50.0000 mg | ORAL_TABLET | Freq: Four times a day (QID) | ORAL | 0 refills | Status: DC | PRN

## 2015-10-20 MED ORDER — VERAPAMIL HCL 120 MG PO TABS: 120.0000 mg | ORAL_TABLET | Freq: Two times a day (BID) | ORAL | 2 refills | Status: DC

## 2015-10-20 NOTE — Progress Notes (Signed)
Followed up with [name] [name], [place] regarding discharge. He has not heard back from [facility] and thinks they may end up wanting an OT evaluation. Spoke with Dr. Tressia Miners. Instructed to postpone discharge until tomorrow and order OT eval.

## 2015-10-20 NOTE — Progress Notes (Signed)
Physical Therapy Treatment Patient Details Name: [name] [name]: [id number] DOB: 09-Nov-1945 Today's Date: 10/20/2015    History of Present Illness 70 y/o female with a h/o COPD, bronchitis, CHF and right renal mass(benign?) presenting with acute on chronic  hypercarbic respiratory failure and uncontrolled hypertension likely due to non-adherence.    PT Comments    Treatment attempted for a second time today. Pt continues lethargic, but with continued encouragement able to get pt to participate with some lower extremity exercises with assistance as needed. Pt cycles through various emotions throughout session from relatively flat affect to tearful to fearful to happy and smiling when an old friend visited. At that point pt demonstrated improved effort in sitting to edge of bed. Pt requires Max A for all bed mobility and maintaining sitting balance, but does sit edge of bed for approximately 10 minutes. Pt requires consistent and heavy cueing to find balance. Pt returns to bed with Max assist for trunk and Mod for Bilateral lower extremities. Pt is able to demonstrate use of Bilateral lower extremities to push self upward in bed with head of bed declined lower than feet. Spoke with SW regarding session as well as nursing. Continue PT to progress strength and activity tolerance to improve all functional mobility.   Follow Up Recommendations  SNF     Equipment Recommendations       Recommendations for Other Services       Precautions / Restrictions Restrictions Weight Bearing Restrictions: No    Mobility  Bed Mobility Overal bed mobility: Needs Assistance Bed Mobility: Supine to Sit;Sit to Supine     Supine to sit: Max assist Sit to supine: Max assist   General bed mobility comments: Pt does initiate small movement with BLE and hips to move to edge of bed; requires Max A for trunk elevation. Requires Mod to mostly Max A to maintain sitting balance. Heavy cueing for finding center  of gravity and to open eyes for focus on balance  Transfers                 General transfer comment: Not safe to attempt at this time due to poor sitting balance  Ambulation/Gait                 Stairs            Wheelchair Mobility    Modified Rankin (Stroke Patients Only)       Balance Overall balance assessment: Needs assistance Sitting-balance support: Feet supported;Single extremity supported;Bilateral upper extremity supported Sitting balance-Leahy Scale: Zero (occasional zero +)   Postural control: Right lateral lean;Posterior lean                          Cognition   Behavior During Therapy: Flat affect;WFL for tasks assessed/performed (tearful) Overall Cognitive Status: Difficult to assess                      Exercises General Exercises - Lower Extremity Ankle Circles/Pumps: AROM;Right;10 reps;Supine (AAROM on L) Quad Sets: Strengthening;Both;10 reps;Supine (heavy cueing ) Short Arc Quad: AAROM;Both;10 reps;Supine Heel Slides: AAROM;Both;10 reps;Supine (partial range) Hip ABduction/ADduction: AAROM;Both;10 reps;Supine (small range) Other Exercises Other Exercises: adductor squeeze 10s supine    General Comments        Pertinent Vitals/Pain Pain Assessment: No/denies pain    Home Living  Prior Function            PT Goals (current goals can now be found in the care plan section) Progress towards PT goals: Progressing toward goals (very slowly)    Frequency  Min 2X/week    PT Plan Current plan remains appropriate    Co-evaluation             End of Session Equipment Utilized During Treatment: Oxygen Activity Tolerance: Patient limited by fatigue;Patient limited by lethargy (extreme weakness; poor balance) Patient left: in bed;with call bell/phone within reach;with bed alarm set     Time: [id number] PT Time Calculation (min) (ACUTE ONLY): 27 min  Charges:   $Therapeutic Exercise: 8-22 mins $Therapeutic Activity: 8-22 mins                    G CodesCharlaine [name], PTA 10/20/2015, 3:51 PM

## 2015-10-20 NOTE — Clinical Social Work Note (Addendum)
MSW received passar number for patient, Passar number is [id number].  Patient wants to go to SNF for short term rehab and would like to go to [facility].  MSW faxed clinical information to insurance company awaiting authorization for SNF placement.  MSW to continue to follow patient's progress throughout discharge planning.  4:00 pm Updated clinicals were requested by insurance company.  MSW faxed clinical notes, awaiting response back from insurance company.  Jones Broom. Javonte Elenes, MSW [phone]  Mon-Fri 8a-4:30p 10/20/2015 11:53 AM

## 2015-10-20 NOTE — Progress Notes (Signed)
Pt. Slept throughout the night with bi-pap in place. No signs or c/o pain, SOB or acute distress noted.. Pt continues to be incontinent of urine and stool. Will continue to monitor pt.

## 2015-10-20 NOTE — Discharge Summary (Signed)
[facility] [place] NAME: [name] [name]    MR#:  [id number]  DATE OF BIRTH:  05-Aug-1945  DATE OF ADMISSION:  10/12/2015   ADMITTING PHYSICIAN: Laverle Hobby, MD  DATE OF DISCHARGE: 10/20/2015  PRIMARY CARE PHYSICIAN: Alvester Chou, NP   ADMISSION DIAGNOSIS:   Hypercarbia [R06.89] Dyspnea [R06.00] SOB (shortness of breath) [R06.02] Acute dyspnea [R06.00]  DISCHARGE DIAGNOSIS:   Principal Problem:   Schizoaffective disorder, bipolar type ([place]) Active Problems:   Acute respiratory failure ([place])   SECONDARY DIAGNOSIS:   Past Medical History:  Diagnosis Date  . Anxiety   . Arthritis   . Bronchitis   . Bursitis   . CAD (coronary artery disease)   . CHF (congestive heart failure) ([place])   . Chronic cough   . Chronic kidney disease    shadow on x-ray  . COPD (chronic obstructive pulmonary disease) ([place])   . Depression   . DM2 (diabetes mellitus, type 2) ([place])   . Environmental allergies   . GERD (gastroesophageal reflux disease)   . Hypercholesteremia   . Hypertension   . Left ventricular outflow tract obstruction    a. echo 03/2014: EF 60-65%, hypernamic LV systolic fxn, mod LVH w/ LVOT gradient estimated at 68 mm Hg w/ valsalva, very small LV internal cavity size, mildly increased LV posterior wall thickness, mild Ao valve scl w/o stenosis, diastolic dysfunction, normal RVSP  . Lower extremity edema   . LVH (left ventricular hypertrophy)    a. echo suggests long standing uncontrolled htn. she will not do well when dehydrated, LV cavity obliteration  . Muscle weakness   . Obesity   . On supplemental oxygen therapy    AS NEEDED  . OSA (obstructive sleep apnea)    does not use machine  . Osteoarthritis   . Schizophrenia ([place])   . Tremors of nervous system   . Wheezing     HOSPITAL COURSE:   70 year old female with past medical history significant for hypertrophic cardiomyopathy, diastolic CHF, COPD,  schizophrenia and bipolar disorder presents to Hospital that respiratory failure. She was transferred from critical care unit to medical floor.  #1 acute on chronic respiratory failure-secondary to COPD and obesity hypoventilation. -Continue BiPAP/ CPAP at nighttime. Will need sleep study as outpatient -on 3-4l  nasal cannula. -continue low-dose Lasix and also on nebulizer treatments  #2 schizoaffective disorder : management per psychiatry -Continue Depakote and Prolixin -Seems to be at baseline-mostly fatigued and tired. Arousable.  #3 hypertension-on verapamil, Lasix and Coreg  #4 acute on chronic diastolic heart failure-improved after Lasix. Change to oral  #5 CAD-continue cardiac medications. Appears stable.  #6 acute renal failure-could be overdiuresis. Restart Lasix low dose at discharge, otherwise due to her ventricular hypertrophy, high risk for pulmonary edema.  Patient has overall poor prognosis. Discussed with son. Agree with DO NOT RESUSCITATE. Will benefit from palliative care follow-up   DISCHARGE CONDITIONS:   Guarded CONSULTS OBTAINED:   Treatment Team:  Vilinda Boehringer, MD Gonzella Lex, MD  DRUG ALLERGIES:   Allergies  Allergen Reactions  . Haldol [Haloperidol Decanoate] Swelling and Other (See Comments)    Reaction:  Swelling of tongue and blurred vision    . Metformin Diarrhea  . Prednisone Other (See Comments)    Unknown reaction  . Raspberry Swelling and Other (See Comments)    Reaction:  Swelling of lips    DISCHARGE MEDICATIONS:     Medication List    STOP taking  these medications   cyclobenzaprine 5 MG tablet Commonly known as:  FLEXERIL   divalproex 500 MG 24 hr tablet Commonly known as:  DEPAKOTE ER Replaced by:  divalproex 500 MG DR tablet   docusate sodium 100 MG capsule Commonly known as:  COLACE   glipiZIDE 10 MG tablet Commonly known as:  GLUCOTROL   insulin glargine 100 UNIT/ML injection Commonly known as:   LANTUS   lisinopril 40 MG tablet Commonly known as:  PRINIVIL,ZESTRIL   meloxicam 15 MG tablet Commonly known as:  MOBIC   sitaGLIPtin 100 MG tablet Commonly known as:  JANUVIA     TAKE these medications   acetaminophen 325 MG tablet Commonly known as:  TYLENOL Take 2 tablets (650 mg total) by mouth every 4 (four) hours as needed for mild pain (temp > 101.5).   albuterol 1.25 MG/3ML nebulizer solution Commonly known as:  ACCUNEB Take 3 mLs by nebulization 3 (three) times daily as needed for wheezing or shortness of breath. What changed:  Another medication with the same name was removed. Continue taking this medication, and follow the directions you see here.   aspirin EC 81 MG tablet Take 81 mg by mouth daily.   atorvastatin 20 MG tablet Commonly known as:  LIPITOR Take 20 mg by mouth at bedtime.   benztropine 0.5 MG tablet Commonly known as:  COGENTIN Take 0.5 mg by mouth 2 (two) times daily. *Note Dose*   benztropine 1 MG tablet Commonly known as:  COGENTIN Take 1 mg by mouth daily as needed for tremors. For muscle stiffness/contracture. *Note dose*   carvedilol 12.5 MG tablet Commonly known as:  COREG Take 1 tablet (12.5 mg total) by mouth 2 (two) times daily. What changed:  medication strength  how much to take   dicyclomine 20 MG tablet Commonly known as:  BENTYL Take 1 tablet (20 mg total) by mouth 3 (three) times daily as needed for spasms.   divalproex 500 MG DR tablet Commonly known as:  DEPAKOTE Take 1 tablet (500 mg total) by mouth 2 (two) times daily. Replaces:  divalproex 500 MG 24 hr tablet   fluPHENAZine 5 MG tablet Commonly known as:  PROLIXIN Take 5-15 mg by mouth See admin instructions. Take 5 mg by mouth every morning and Take 3 tablets (15 mg) by mouth every night at bedtime.   fluPHENAZine decanoate 25 MG/ML injection Commonly known as:  PROLIXIN Inject 1 mL (25 mg total) into the muscle every 14 (fourteen) days.    Fluticasone-Salmeterol 250-50 MCG/DOSE Aepb Commonly known as:  ADVAIR Inhale 1 puff into the lungs 2 (two) times daily.   furosemide 20 MG tablet Commonly known as:  LASIX Take 1 tablet (20 mg total) by mouth daily.   guaiFENesin 100 MG/5ML Soln Commonly known as:  ROBITUSSIN Take 15 mLs by mouth every 6 (six) hours as needed for cough or to loosen phlegm. *Notify physician if cough persists more than 3 days*   LORazepam 0.5 MG tablet Commonly known as:  ATIVAN Take 1 tablet (0.5 mg total) by mouth every 8 (eight) hours as needed for anxiety. Pt is also able to take an additional tablet daily if needed for anxiety. What changed:  when to take this  reasons to take this   metoCLOPramide 10 MG tablet Commonly known as:  REGLAN Take 1 tablet (10 mg total) by mouth every 6 (six) hours as needed for nausea or vomiting.   ranitidine 150 MG tablet Commonly known as:  ZANTAC Take  150 mg by mouth 2 (two) times daily.   tiotropium 18 MCG inhalation capsule Commonly known as:  SPIRIVA Place 1 capsule (18 mcg total) into inhaler and inhale daily.   traMADol 50 MG tablet Commonly known as:  ULTRAM Take 1 tablet (50 mg total) by mouth every 6 (six) hours as needed for moderate pain.   verapamil 120 MG tablet Commonly known as:  CALAN Take 1 tablet (120 mg total) by mouth every 12 (twelve) hours. What changed:  medication strength  how much to take  when to take this        DISCHARGE INSTRUCTIONS:   1. PCP follow-up in 1-2 weeks 2. Psychiatric follow-up in 1-2 weeks 3. CPAP at bedtime 4. Palliative care follow-up  DIET:   Cardiac diet  ACTIVITY:   Activity as tolerated  OXYGEN:   Home Oxygen: Yes.    Oxygen Delivery: 4 liters/min via Patient connected to nasal cannula oxygen  DISCHARGE LOCATION:   nursing home   If you experience worsening of your admission symptoms, develop shortness of breath, life threatening emergency, suicidal or homicidal thoughts  you must seek medical attention immediately by calling 911 or calling your MD immediately  if symptoms less severe.  You Must read complete instructions/literature along with all the possible adverse reactions/side effects for all the Medicines you take and that have been prescribed to you. Take any new Medicines after you have completely understood and accpet all the possible adverse reactions/side effects.   Please note  You were cared for by a hospitalist during your hospital stay. If you have any questions about your discharge medications or the care you received while you were in the hospital after you are discharged, you can call the unit and asked to speak with the hospitalist on call if the hospitalist that took care of you is not available. Once you are discharged, your primary care physician will handle any further medical issues. Please note that NO REFILLS for any discharge medications will be authorized once you are discharged, as it is imperative that you return to your primary care physician (or establish a relationship with a primary care physician if you do not have one) for your aftercare needs so that they can reassess your need for medications and monitor your lab values.    On the day of Discharge:  VITAL SIGNS:   Blood pressure (!) 155/85, pulse 74, temperature 98.2 F (36.8 C), temperature source Oral, resp. rate 18, height 5\' 7"  (1.702 m), weight 124.9 kg (275 lb 5 oz), SpO2 98 %.  PHYSICAL EXAMINATION:    GENERAL:  70 y.o.-year-old patient lying in the bed with no acute distress.  deconditioned EYES: Pupils equal, round, reactive to light and accommodation. No scleral icterus. Extraocular muscles intact.  HEENT: Head atraumatic, normocephalic. Oropharynx and nasopharynx clear.  NECK:  Supple, no jugular venous distention. No thyroid enlargement, no tenderness.  LUNGS: Normal breath sounds bilaterally, no wheezing, rales,rhonchi or crepitation. No use of accessory  muscles of respiration. Decreased bibasilar breath sounds. CARDIOVASCULAR: S1, S2 normal. No murmurs, rubs, or gallops.  ABDOMEN: Soft, nontender, nondistended. Bowel sounds present. No organomegaly or mass.  EXTREMITIES: No  cyanosis, or clubbing. 1+ pedal edema present NEUROLOGIC: Cranial nerves II through XII are intact. Muscle strength 3/5 in all extremities. Sensation intact. Gait not checked. Global weakness present PSYCHIATRIC: The patient is alert and oriented to self. More interacting when aroused. SKIN: No obvious rash, lesion, or ulcer.   DATA REVIEW:  CBC  Recent Labs Lab 10/17/15 0521  WBC 13.7*  HGB 12.7  HCT 38.7  PLT 205    Chemistries   Recent Labs Lab 10/15/15 0411  10/20/15 0450  NA 147*  < > 141  K 4.2  < > 4.2  CL 106  < > 100*  CO2 34*  < > 36*  GLUCOSE 137*  < > 116*  BUN 20  < > 26*  CREATININE 0.85  < > 0.79  CALCIUM 9.1  < > 8.7*  MG 2.0  --   --   < > = values in this interval not displayed.   Microbiology Results  Results for orders placed or performed during the hospital encounter of 10/12/15  MRSA PCR Screening     Status: None   Collection Time: 10/13/15  4:30 AM  Result Value Ref Range Status   MRSA by PCR NEGATIVE NEGATIVE Final    Comment:        The GeneXpert MRSA Assay (FDA approved for NASAL specimens only), is one component of a comprehensive MRSA colonization surveillance program. It is not intended to diagnose MRSA infection nor to guide or monitor treatment for MRSA infections.     RADIOLOGY:  No results found.   Management plans discussed with the patient, family and they are in agreement.  CODE STATUS:     Code Status Orders        Start     Ordered   10/14/15 1250  Do not attempt resuscitation (DNR)  Continuous    Question Answer Comment  In the event of cardiac or respiratory ARREST Do not call a "code blue"   In the event of cardiac or respiratory ARREST Do not perform Intubation, CPR,  defibrillation or ACLS   In the event of cardiac or respiratory ARREST Use medication by any route, position, wound care, and other measures to relive pain and suffering. May use oxygen, suction and manual treatment of airway obstruction as needed for comfort.      10/14/15 1249    Code Status History    Date Active Date Inactive Code Status Order ID Comments User Context   10/13/2015  2:01 AM 10/14/2015 12:49 PM Full Code [id number]  Mikael Spray, NP ED   10/29/2014  8:43 PM 11/05/2014  8:03 PM Full Code [id number]  Gonzella Lex, MD Inpatient   07/12/202016  7:36 PM 08/12/2014  4:35 PM Full Code [id number]  Sheryle Spray, RN ED   06/29/2014  3:23 PM 07/04/2014 12:50 AM Full Code [id number]  Gladstone Lighter, MD Inpatient   06/24/2011  6:32 PM 07/01/2011 12:02 AM Full Code [id number]  Kathie Dike, MD Inpatient      TOTAL TIME TAKING CARE OF THIS PATIENT: 38 minutes.    Ivah Girardot M.D on 10/20/2015 at 12:59 PM  Between 7am to 6pm - Pager - [phone]  After 6pm go to [email or web address] - Proofreader  [facility]  Office  [phone]  CC: Primary care physician; Alvester Chou, NP   Note: This dictation was prepared with Dragon dictation along with smaller phrase technology. Any transcriptional errors that result from this process are unintentional.

## 2015-10-20 NOTE — Care Management Important Message (Signed)
Important Message  Patient Details  Name: [name] [name] MRN: [id number] Date of Birth: [birth year]   Medicare Important Message Given:  Yes    Jolly Mango, RN 10/20/2015, 9:17 AM

## 2015-10-20 NOTE — Progress Notes (Signed)
Texted Dr. Tressia Miners to make aware patient refused all 10A meds. Will continue to monitor. [name] [name] [facility]

## 2015-10-20 NOTE — Progress Notes (Signed)
PT Cancellation Note  Patient Details Name: [name] [name] MRN: [id number] DOB: Dec 18, 1945   Cancelled Treatment:    Reason Eval/Treat Not Completed: Fatigue/lethargy limiting ability to participate;Other (comment). Treatment attempted; pt with very little movement response to requests; occasionally answers in response to yes/no question very slowly, but no follow through with activity and easily falls asleep. Re attempt treatment at a later time/date and the schedule allows.    Charlaine Dalton, [place] 10/20/2015, 10:57 AM

## 2015-10-21 LAB — GLUCOSE, CAPILLARY
GLUCOSE-CAPILLARY: 181 mg/dL — AB (ref 65–99)
Glucose-Capillary: 102 mg/dL — ABNORMAL HIGH (ref 65–99)
Glucose-Capillary: 141 mg/dL — ABNORMAL HIGH (ref 65–99)
Glucose-Capillary: 89 mg/dL (ref 65–99)

## 2015-10-21 NOTE — Evaluation (Signed)
Occupational Therapy Evaluation Patient Details Name: [name] [name] MRN: [id number] DOB: [birth year] Today's Date: 10/21/2015    History of Present Illness 70 y/o female with a h/o COPD, bronchitis, CHF and right renal mass(benign?) presenting with acute on chronic  hypercarbic respiratory failure and uncontrolled hypertension likely due to non-adherence.   Clinical Impression   Pt is 70 year old female who is able to follow directions for simple one step tasks only with extra time and mod cues to stay awake.  Her emotions ranged greatly during session from flat affect to tearful to happy. She fell asleep after only a few minutes and would state she was awake but eyes stayed closed.  Tried cold wash cloth and she was able to wipe face partially and then fell asleep but was aware that cloth was cold.  She required mod assist for drinking out of cup with straw.  She requires max to total assist for UB and LB bathing, UB dressing and total assist for LB dressing.  Max assist to attempt to roll using rails of bed.  Pt falling asleep again while rolling so EOB was not attempted. Pt's son Lambert Keto was present and indicated she needed a lot of assistance at [facility] and would fall asleep frequently like this there.  Rec continued to OT and monitor progress for ADL retraining focusing on automatic tasks like self feeding, grooming and UB bathing and dressing with both tactile and verbal cues to maintain attention to task.  Rec SNF after discharge to continue rehab goals.      Follow Up Recommendations  SNF    Equipment Recommendations       Recommendations for Other Services       Precautions / Restrictions Precautions Precautions: Fall Precaution Comments: 4L nasal cannula Restrictions Weight Bearing Restrictions: Yes      Mobility Bed Mobility                  Transfers                      Balance                                            ADL  Overall ADL's : Needs assistance/impaired                                       General ADL Comments: Pt is able to follow directions for simple one step tasks only with extra time and mod cues to stay awake.  She fell asleep after only a few minutes and would state she was awake but eyes stayed closed.  Tried cold wash cloth and she was able to wipe face partially and then fell asleep but was aware that cloth was cold.  She required mod assist for drinking out of cup with straw.  She requires max to total assist for UB and LB bathing, UB dressing and total assist for LB dressing.  Max assist to attempt to roll using rails of bed.  Pt falling asleep again while rolling so EOB was not attempted.       Vision     Perception     Praxis      Pertinent Vitals/Pain Pain Assessment: No/denies pain  Hand Dominance Right   Extremity/Trunk Assessment Upper Extremity Assessment Upper Extremity Assessment: Generalized weakness;LUE deficits/detail LUE Deficits / Details: AROM to 80 degrees flexion and tightness present but no signs of pain.  Unable to follow directions to test sensation due to falling asleep.           Communication Communication Communication: Other (comment) (diifficulty staying alert for tasks and able to respond appropriately but falls asleep mid task)   Cognition Arousal/Alertness: Lethargic Behavior During Therapy: Flat affect;Anxious (freq changes in emotion with crying about past memories but able to redirect to task) Overall Cognitive Status: Difficult to assess                     General Comments       Exercises       Shoulder Instructions      Home Living Family/patient expects to be discharged to:: Skilled nursing facility                                 Additional Comments: She is a resident at [facility] and her son is not interested in going back there and indicated she fell there in the past.      Prior  Functioning/Environment Level of Independence: Independent with assistive device(s);Needs assistance    ADL's / Homemaking Assistance Needed: Pt's son Lambert Keto reports that she needed assist for feeding, bathing and dressing at [facility] and would freq fall asleep like she demonstrated today.        OT Diagnosis: Generalized weakness;Cognitive deficits;Altered mental status   OT Problem List: Decreased strength;Decreased range of motion;Decreased activity tolerance;Decreased coordination;Decreased safety awareness;Decreased cognition   OT Treatment/Interventions: Self-care/ADL training;Therapeutic activities;Patient/family education    OT Goals(Current goals can be found in the care plan section) Acute Rehab OT Goals Patient Stated Goal: unable ADL Goals Pt Will Perform Eating: with set-up;with min assist;sitting Pt Will Perform Grooming: with set-up;with min assist;sitting;bed level Pt Will Perform Upper Body Bathing: with set-up;with mod assist;bed level Pt Will Perform Lower Body Bathing: with set-up;with max assist;bed level Pt Will Perform Upper Body Dressing: with set-up;with mod assist;bed level Pt Will Perform Lower Body Dressing: with set-up;with max assist;bed level Pt Will Transfer to Toilet: squat pivot transfer;with max assist;with set-up;bedside commode  OT Frequency: Min 1X/week   Barriers to D/C:            Co-evaluation              End of Session Nurse Communication:  (pt clearing throat alot at beginning of session but clear by end )  Activity Tolerance: Patient limited by lethargy Patient left: in bed;with call bell/phone within reach;with bed alarm set;with family/visitor present   Time: 1005-1045 OT Time Calculation (min): 40 min Charges:  OT General Charges $OT Visit: 1 Procedure OT Evaluation $OT Eval High Complexity: 1 Procedure OT Treatments $Self Care/Home Management : 23-37 mins G-Codes:      Chrys Racer, OTR/L ascom  [phone] 10/21/15, 11:01 AM

## 2015-10-21 NOTE — Clinical Social Work Note (Addendum)
Patient had bed available at [facility], SNF called MSW at 2:00pm and said they are not able to accept patient anymore.  MSW continuing to look for other SNF bed placements.  Insurance has been approved for SNF auth [id number], MSW contacted [facility] and they said they will review her information again to determine if they can accept her, and then call MSW back.  MSW also called [facility] and left message with SNF awaiting call back.  [facility] was contacted and they can not accept patient either, MSW continuing bed search for patient.  5:15pm  Patient has a bed available at [facility], however they are not able to order CPAP till tomorrow morning.  MSW attempted to call patient's son to find out if she had her own CPAP, however, MSW was not able to leave message with patient's son.  Patient will not be able to discharge till tomorrow due SNF not being able to get CPAP tonight.  MSW updated bedside nurse, MSW to continue to follow patient's progress.  Jones Broom. Mirka Barbone, MSW [phone]  Mon-Fri 8a-4:30p 10/21/2015 3:22 PM

## 2015-10-21 NOTE — Discharge Summary (Signed)
[facility] NAME: [name] [name]    MR#:  [id number]  DATE OF BIRTH:  11/09/1945  DATE OF ADMISSION:  10/12/2015   ADMITTING PHYSICIAN: Laverle Hobby, MD  DATE OF DISCHARGE: 10/20/2015  PRIMARY CARE PHYSICIAN: Alvester Chou, NP   ADMISSION DIAGNOSIS:   Hypercarbia [R06.89] Dyspnea [R06.00] SOB (shortness of breath) [R06.02] Acute dyspnea [R06.00]  DISCHARGE DIAGNOSIS:   Principal Problem:   Schizoaffective disorder, bipolar type ([place]) Active Problems:   Acute respiratory failure ([place])   SECONDARY DIAGNOSIS:   Past Medical History:  Diagnosis Date  . Anxiety   . Arthritis   . Bronchitis   . Bursitis   . CAD (coronary artery disease)   . CHF (congestive heart failure) ([place])   . Chronic cough   . Chronic kidney disease    shadow on x-ray  . COPD (chronic obstructive pulmonary disease) ([place])   . Depression   . DM2 (diabetes mellitus, type 2) ([place])   . Environmental allergies   . GERD (gastroesophageal reflux disease)   . Hypercholesteremia   . Hypertension   . Left ventricular outflow tract obstruction    a. echo 03/2014: EF 60-65%, hypernamic LV systolic fxn, mod LVH w/ LVOT gradient estimated at 68 mm Hg w/ valsalva, very small LV internal cavity size, mildly increased LV posterior wall thickness, mild Ao valve scl w/o stenosis, diastolic dysfunction, normal RVSP  . Lower extremity edema   . LVH (left ventricular hypertrophy)    a. echo suggests long standing uncontrolled htn. she will not do well when dehydrated, LV cavity obliteration  . Muscle weakness   . Obesity   . On supplemental oxygen therapy    AS NEEDED  . OSA (obstructive sleep apnea)    does not use machine  . Osteoarthritis   . Schizophrenia ([place])   . Tremors of nervous system   . Wheezing     HOSPITAL COURSE:   70 year old female with past medical history significant for hypertrophic cardiomyopathy, diastolic CHF, COPD,  schizophrenia and bipolar disorder presents to Hospital that respiratory failure. She was transferred from critical care unit to medical floor.  #1 acute on chronic respiratory failure-secondary to COPD and obesity hypoventilation. -Continue BiPAP/ CPAP at nighttime. Will need sleep study as outpatient -on 3-4l  nasal cannula. -continue low-dose Lasix and also on nebulizer treatments  #2 schizoaffective disorder : management per psychiatry -Continue Depakote and Prolixin -Seems to be at baseline-mostly fatigued and tired. Arousable.  #3 hypertension-on verapamil, Lasix and Coreg  #4 acute on chronic diastolic heart failure-improved after Lasix. Change to oral  #5 CAD-continue cardiac medications. Appears stable.  #6 acute renal failure-could be overdiuresis. Restarted Lasix low dose at discharge, otherwise due to her ventricular hypertrophy, high risk for pulmonary edema.  Patient has overall poor prognosis. Discussed with son. Agree with DO NOT RESUSCITATE. Will benefit from palliative care follow-up Will be discharged to SNF today   DISCHARGE CONDITIONS:   Guarded CONSULTS OBTAINED:   Treatment Team:  Vilinda Boehringer, MD Gonzella Lex, MD  DRUG ALLERGIES:   Allergies  Allergen Reactions  . Haldol [Haloperidol Decanoate] Swelling and Other (See Comments)    Reaction:  Swelling of tongue and blurred vision    . Metformin Diarrhea  . Prednisone Other (See Comments)    Unknown reaction  . Raspberry Swelling and Other (See Comments)    Reaction:  Swelling of lips    DISCHARGE MEDICATIONS:     Medication  List    STOP taking these medications   cyclobenzaprine 5 MG tablet Commonly known as:  FLEXERIL   divalproex 500 MG 24 hr tablet Commonly known as:  DEPAKOTE ER Replaced by:  divalproex 500 MG DR tablet   docusate sodium 100 MG capsule Commonly known as:  COLACE   glipiZIDE 10 MG tablet Commonly known as:  GLUCOTROL   insulin glargine 100 UNIT/ML  injection Commonly known as:  LANTUS   lisinopril 40 MG tablet Commonly known as:  PRINIVIL,ZESTRIL   meloxicam 15 MG tablet Commonly known as:  MOBIC   sitaGLIPtin 100 MG tablet Commonly known as:  JANUVIA     TAKE these medications   acetaminophen 325 MG tablet Commonly known as:  TYLENOL Take 2 tablets (650 mg total) by mouth every 4 (four) hours as needed for mild pain (temp > 101.5).   albuterol 1.25 MG/3ML nebulizer solution Commonly known as:  ACCUNEB Take 3 mLs by nebulization 3 (three) times daily as needed for wheezing or shortness of breath. What changed:  Another medication with the same name was removed. Continue taking this medication, and follow the directions you see here.   aspirin EC 81 MG tablet Take 81 mg by mouth daily.   atorvastatin 20 MG tablet Commonly known as:  LIPITOR Take 20 mg by mouth at bedtime.   benztropine 0.5 MG tablet Commonly known as:  COGENTIN Take 0.5 mg by mouth 2 (two) times daily. *Note Dose*   benztropine 1 MG tablet Commonly known as:  COGENTIN Take 1 mg by mouth daily as needed for tremors. For muscle stiffness/contracture. *Note dose*   carvedilol 12.5 MG tablet Commonly known as:  COREG Take 1 tablet (12.5 mg total) by mouth 2 (two) times daily. What changed:  medication strength  how much to take   dicyclomine 20 MG tablet Commonly known as:  BENTYL Take 1 tablet (20 mg total) by mouth 3 (three) times daily as needed for spasms.   divalproex 500 MG DR tablet Commonly known as:  DEPAKOTE Take 1 tablet (500 mg total) by mouth 2 (two) times daily. Replaces:  divalproex 500 MG 24 hr tablet   fluPHENAZine 5 MG tablet Commonly known as:  PROLIXIN Take 5-15 mg by mouth See admin instructions. Take 5 mg by mouth every morning and Take 3 tablets (15 mg) by mouth every night at bedtime.   fluPHENAZine decanoate 25 MG/ML injection Commonly known as:  PROLIXIN Inject 1 mL (25 mg total) into the muscle every 14  (fourteen) days.   Fluticasone-Salmeterol 250-50 MCG/DOSE Aepb Commonly known as:  ADVAIR Inhale 1 puff into the lungs 2 (two) times daily.   furosemide 20 MG tablet Commonly known as:  LASIX Take 1 tablet (20 mg total) by mouth daily.   guaiFENesin 100 MG/5ML Soln Commonly known as:  ROBITUSSIN Take 15 mLs by mouth every 6 (six) hours as needed for cough or to loosen phlegm. *Notify physician if cough persists more than 3 days*   LORazepam 0.5 MG tablet Commonly known as:  ATIVAN Take 1 tablet (0.5 mg total) by mouth every 8 (eight) hours as needed for anxiety. Pt is also able to take an additional tablet daily if needed for anxiety. What changed:  when to take this  reasons to take this   metoCLOPramide 10 MG tablet Commonly known as:  REGLAN Take 1 tablet (10 mg total) by mouth every 6 (six) hours as needed for nausea or vomiting.   ranitidine 150 MG tablet  Commonly known as:  ZANTAC Take 150 mg by mouth 2 (two) times daily.   tiotropium 18 MCG inhalation capsule Commonly known as:  SPIRIVA Place 1 capsule (18 mcg total) into inhaler and inhale daily.   traMADol 50 MG tablet Commonly known as:  ULTRAM Take 1 tablet (50 mg total) by mouth every 6 (six) hours as needed for moderate pain.   verapamil 120 MG tablet Commonly known as:  CALAN Take 1 tablet (120 mg total) by mouth every 12 (twelve) hours. What changed:  medication strength  how much to take  when to take this        DISCHARGE INSTRUCTIONS:   1. PCP follow-up in 1-2 weeks 2. Psychiatric follow-up in 1-2 weeks 3. CPAP at bedtime 4. Palliative care follow-up  DIET:   Cardiac diet  ACTIVITY:   Activity as tolerated  OXYGEN:   Home Oxygen: Yes.    Oxygen Delivery: 4 liters/min via Patient connected to nasal cannula oxygen  DISCHARGE LOCATION:   nursing home   If you experience worsening of your admission symptoms, develop shortness of breath, life threatening emergency, suicidal or  homicidal thoughts you must seek medical attention immediately by calling 911 or calling your MD immediately  if symptoms less severe.  You Must read complete instructions/literature along with all the possible adverse reactions/side effects for all the Medicines you take and that have been prescribed to you. Take any new Medicines after you have completely understood and accpet all the possible adverse reactions/side effects.   Please note  You were cared for by a hospitalist during your hospital stay. If you have any questions about your discharge medications or the care you received while you were in the hospital after you are discharged, you can call the unit and asked to speak with the hospitalist on call if the hospitalist that took care of you is not available. Once you are discharged, your primary care physician will handle any further medical issues. Please note that NO REFILLS for any discharge medications will be authorized once you are discharged, as it is imperative that you return to your primary care physician (or establish a relationship with a primary care physician if you do not have one) for your aftercare needs so that they can reassess your need for medications and monitor your lab values.    On the day of Discharge:  VITAL SIGNS:   Blood pressure (!) 143/67, pulse 60, temperature 98.3 F (36.8 C), temperature source Oral, resp. rate 20, height 5\' 7"  (1.702 m), weight 124.7 kg (275 lb), SpO2 99 %.  PHYSICAL EXAMINATION:    GENERAL:  70 y.o.-year-old patient lying in the bed with no acute distress.  deconditioned EYES: Pupils equal, round, reactive to light and accommodation. No scleral icterus. Extraocular muscles intact.  HEENT: Head atraumatic, normocephalic. Oropharynx and nasopharynx clear.  NECK:  Supple, no jugular venous distention. No thyroid enlargement, no tenderness.  LUNGS: Normal breath sounds bilaterally, no wheezing, rales,rhonchi or crepitation. No use of  accessory muscles of respiration. Decreased bibasilar breath sounds. CARDIOVASCULAR: S1, S2 normal. No murmurs, rubs, or gallops.  ABDOMEN: Soft, nontender, nondistended. Bowel sounds present. No organomegaly or mass.  EXTREMITIES: No  cyanosis, or clubbing. 1+ pedal edema present NEUROLOGIC: Cranial nerves II through XII are intact. Muscle strength 3/5 in all extremities. Sensation intact. Gait not checked. Global weakness present PSYCHIATRIC: The patient is alert and oriented to self. Happy and interacting, sleeps often. SKIN: No obvious rash, lesion, or ulcer.  DATA REVIEW:   CBC  Recent Labs Lab 10/17/15 0521  WBC 13.7*  HGB 12.7  HCT 38.7  PLT 205    Chemistries   Recent Labs Lab 10/15/15 0411  10/20/15 0450  NA 147*  < > 141  K 4.2  < > 4.2  CL 106  < > 100*  CO2 34*  < > 36*  GLUCOSE 137*  < > 116*  BUN 20  < > 26*  CREATININE 0.85  < > 0.79  CALCIUM 9.1  < > 8.7*  MG 2.0  --   --   < > = values in this interval not displayed.   Microbiology Results  Results for orders placed or performed during the hospital encounter of 10/12/15  MRSA PCR Screening     Status: None   Collection Time: 10/13/15  4:30 AM  Result Value Ref Range Status   MRSA by PCR NEGATIVE NEGATIVE Final    Comment:        The GeneXpert MRSA Assay (FDA approved for NASAL specimens only), is one component of a comprehensive MRSA colonization surveillance program. It is not intended to diagnose MRSA infection nor to guide or monitor treatment for MRSA infections.     RADIOLOGY:  No results found.   Management plans discussed with the patient, family and they are in agreement.  CODE STATUS:     Code Status Orders        Start     Ordered   10/14/15 1250  Do not attempt resuscitation (DNR)  Continuous    Question Answer Comment  In the event of cardiac or respiratory ARREST Do not call a "code blue"   In the event of cardiac or respiratory ARREST Do not perform Intubation,  CPR, defibrillation or ACLS   In the event of cardiac or respiratory ARREST Use medication by any route, position, wound care, and other measures to relive pain and suffering. May use oxygen, suction and manual treatment of airway obstruction as needed for comfort.      10/14/15 1249    Code Status History    Date Active Date Inactive Code Status Order ID Comments User Context   10/13/2015  2:01 AM 10/14/2015 12:49 PM Full Code [id number]  Mikael Spray, NP ED   10/29/2014  8:43 PM 11/05/2014  8:03 PM Full Code [id number]  Gonzella Lex, MD Inpatient   February 09, 202016  7:36 PM 08/12/2014  4:35 PM Full Code [id number]  Sheryle Spray, RN ED   06/29/2014  3:23 PM 07/04/2014 12:50 AM Full Code [id number]  Gladstone Lighter, MD Inpatient   06/24/2011  6:32 PM 07/01/2011 12:02 AM Full Code [id number]  Kathie Dike, MD Inpatient      TOTAL TIME TAKING CARE OF THIS PATIENT: 38 minutes.    Adryel Wortmann M.D on 10/21/2015 at 1:57 PM  Between 7am to 6pm - Pager - [phone]  After 6pm go to [email or web address] - Proofreader  [facility]  Office  [phone]  CC: Primary care physician; Alvester Chou, NP   Note: This dictation was prepared with Dragon dictation along with smaller phrase technology. Any transcriptional errors that result from this process are unintentional.

## 2015-10-21 NOTE — Progress Notes (Signed)
Patient resting comfortably.  VSS.  Awaiting bed placement.

## 2015-10-22 LAB — GLUCOSE, CAPILLARY
GLUCOSE-CAPILLARY: 123 mg/dL — AB (ref 65–99)
GLUCOSE-CAPILLARY: 202 mg/dL — AB (ref 65–99)
Glucose-Capillary: 104 mg/dL — ABNORMAL HIGH (ref 65–99)
Glucose-Capillary: 171 mg/dL — ABNORMAL HIGH (ref 65–99)

## 2015-10-22 NOTE — Care Management (Signed)
Informed that patient's son declined to complete the admission process at [facility] and so the bed offer is being withdrawn.  It would not be a safe discharge to consider discharging patient home under the care of her son [name].  This CM has worked with this patient and her son on previous occassions when patient lived at home and the patient was not able to manage her care needs and neither could [name] even with home health services.  The patient was ultimately placed in assisted living level of care and now her care needs are beyond assisted living level of care.  CM spoke with son [name].  He says he completed all the paper work at [facility] "but they wanted insurance to pay and to take her check."   Spoke with Ms Doyle Askew at [facility].  [name] was informed that [employer] Rossetti would cover up to (maybe less) than 20 days at 100% then would be responsible for a copay.   [name] is adamant that "they are lying- that is not what they said".  Reviewed the benefit again with [name] and the copay after day 20 (or less than 20).  Discussed the need to check to see if the long term medicaid application has been completed.   CM was informed that there are no other bed offers in [facility] and then relayed this to [place]. [name] is agreeable for bed search out of county- [place], [place] and [place] in [place] .  Clarified that patient will discharge on bipap rather than cpap.  She has not been on cpap during this stay- only nocturnal bipap.

## 2015-10-22 NOTE — Progress Notes (Signed)
bipap settings at [facility]  16 IPAP, 8 EPAP, and 24% oxygen.

## 2015-10-22 NOTE — Progress Notes (Signed)
Patient resting in bed with eyes closed. Remains on 02. No distress noted. Contact precautions in use telemetry reading nsr 67. Will continue to monitor

## 2015-10-22 NOTE — Progress Notes (Signed)
Report called to nicole at [facility]. Patient status gone over with rn. Patient to be discharged on 02 at [place]. No distress noted. Packet will be given to ems when transported will continue to monitor

## 2015-10-22 NOTE — Progress Notes (Signed)
Patient resting comfortably.  VSS.  Awaiting bed placement.

## 2015-10-22 NOTE — Progress Notes (Signed)
Per [facility] liaision they can get a Bipap today and will accept patient today. Per [facility] patient will go to room 206-P. RN will call report to RN Elmyra Ricks at [phone] and arrange EMS for transport. Clinical Education officer, museum (CSW) sent D/C orders to [facility]. [employer] Authorization has been received. CSW contacted patient's son [name] and made him aware of above. CSW also explained to son that he will need to go to [facility] and complete admissions paper work. Per son he will go to [facility] tonight. CSW also explained to son that this is for short term rehab and [facility] cannot guarantee a long term care bed however they will help patient transition to a long term care bed if they do not have one. Please reconsult if future social work needs arise. CSW signing off.   [employer], LCSW [phone]

## 2015-10-22 NOTE — Progress Notes (Signed)
Ems on the floor to transport patient to [facility]. Iv removed x2. Telemetry removed. Packet given to ems. Patient to be discharged on o2 at [facility]. No distress noted

## 2015-10-22 NOTE — Progress Notes (Signed)
Per [facility] lia[facility] can [facility] and they are working on getting a Bipap. Per [facility] patient can D/C to [facility] pending the Bipap arrangements. Clinical Education officer, museum (CSW) sent Bipap settings to [facility]. CSW contacted [facility] case manager Minna Merritts and made her aware of above. Per Jackquline [place] authorization is good for [facility]. CSW contacted patient's son [name] and made him aware of above. Son is in agreement with patient going to [facility]. CSW will continue to follow and assist as needed.   [employer], LCSW [phone]

## 2015-10-22 NOTE — Discharge Summary (Signed)
[facility] [place] NAME: [name] [name]    MR#:  [id number]  DATE OF BIRTH:  09-29-1945  DATE OF ADMISSION:  10/12/2015 ADMITTING PHYSICIAN: Laverle Hobby, MD  DATE OF DISCHARGE: No discharge date for patient encounter.  PRIMARY CARE PHYSICIAN: Alvester Chou, NP    ADMISSION DIAGNOSIS:  Hypercarbia [R06.89] Dyspnea [R06.00] SOB (shortness of breath) [R06.02] Acute dyspnea [R06.00]  DISCHARGE DIAGNOSIS:  Principal Problem:   Schizoaffective disorder, bipolar type (HCC) Active Problems:   Acute respiratory failure ([place])   SECONDARY DIAGNOSIS:   Past Medical History:  Diagnosis Date  . Anxiety   . Arthritis   . Bronchitis   . Bursitis   . CAD (coronary artery disease)   . CHF (congestive heart failure) ([place])   . Chronic cough   . Chronic kidney disease    shadow on x-ray  . COPD (chronic obstructive pulmonary disease) ([place])   . Depression   . DM2 (diabetes mellitus, type 2) ([place])   . Environmental allergies   . GERD (gastroesophageal reflux disease)   . Hypercholesteremia   . Hypertension   . Left ventricular outflow tract obstruction    a. echo 03/2014: EF 60-65%, hypernamic LV systolic fxn, mod LVH w/ LVOT gradient estimated at 68 mm Hg w/ valsalva, very small LV internal cavity size, mildly increased LV posterior wall thickness, mild Ao valve scl w/o stenosis, diastolic dysfunction, normal RVSP  . Lower extremity edema   . LVH (left ventricular hypertrophy)    a. echo suggests long standing uncontrolled htn. she will not do well when dehydrated, LV cavity obliteration  . Muscle weakness   . Obesity   . On supplemental oxygen therapy    AS NEEDED  . OSA (obstructive sleep apnea)    does not use machine  . Osteoarthritis   . Schizophrenia ([place])   . Tremors of nervous system   . Wheezing     HOSPITAL COURSE:   70 year old female with past medical history significant for hypertrophic cardiomyopathy,  diastolic CHF, COPD, schizophrenia and bipolar disorder presents to Hospital that respiratory failure. She was transferred from critical care unit to medical floor.  #1 acute on chronic respiratory failure-secondary to COPD and obesity hypoventilation. -Continue BiPAP at nighttime. Will need sleep study as outpatient -on 3-4l nasal cannula. -continue low-dose Lasix and also on nebulizer treatments  #2 schizoaffective disorder : management per psychiatry -Continue Depakote and Prolixin -Seems to be at baseline-mostly fatigued and tired. Arousable.  #3 hypertension-on verapamil, Lasix and Coreg  #4 acute on chronic diastolic heart failure-improved after Lasix. Change to oral  #5 CAD-continue cardiac medications. Appears stable.  #6 acute renal failure-could be overdiuresis. Restarted Lasix low dose at discharge, otherwise due to her ventricular hypertrophy, high risk for pulmonary edema.  Patient has overall poor prognosis. Discussed with son. Agree with DO NOT RESUSCITATE.  Will benefit from palliative care follow-up AT SNF Will be discharged to SNF today    DISCHARGE CONDITIONS:   Guarded  CONSULTS OBTAINED:  Treatment Team:  Vilinda Boehringer, MD Gonzella Lex, MD     DRUG ALLERGIES:   Allergies  Allergen Reactions  . Haldol [Haloperidol Decanoate] Swelling and Other (See Comments)    Reaction:  Swelling of tongue and blurred vision    . Metformin Diarrhea  . Prednisone Other (See Comments)    Unknown reaction  . Raspberry Swelling and Other (See Comments)    Reaction:  Swelling of lips  DISCHARGE MEDICATIONS:   Current Discharge Medication List    START taking these medications   Details  acetaminophen (TYLENOL) 325 MG tablet Take 2 tablets (650 mg total) by mouth every 4 (four) hours as needed for mild pain (temp > 101.5). Qty: 30 tablet, Refills: 2      CONTINUE these medications which have CHANGED   Details  carvedilol (COREG) 12.5 MG  tablet Take 1 tablet (12.5 mg total) by mouth 2 (two) times daily. Qty: 60 tablet, Refills: 2    divalproex (DEPAKOTE) 500 MG DR tablet Take 1 tablet (500 mg total) by mouth 2 (two) times daily. Qty: 60 tablet, Refills: 2    furosemide (LASIX) 20 MG tablet Take 1 tablet (20 mg total) by mouth daily. Qty: 30 tablet, Refills: 0    LORazepam (ATIVAN) 0.5 MG tablet Take 1 tablet (0.5 mg total) by mouth every 8 (eight) hours as needed for anxiety. Pt is also able to take an additional tablet daily if needed for anxiety. Qty: 20 tablet, Refills: 0    traMADol (ULTRAM) 50 MG tablet Take 1 tablet (50 mg total) by mouth every 6 (six) hours as needed for moderate pain. Qty: 30 tablet, Refills: 0    verapamil (CALAN) 120 MG tablet Take 1 tablet (120 mg total) by mouth every 12 (twelve) hours. Qty: 60 tablet, Refills: 2      CONTINUE these medications which have NOT CHANGED   Details  albuterol (ACCUNEB) 1.25 MG/3ML nebulizer solution Take 3 mLs by nebulization 3 (three) times daily as needed for wheezing or shortness of breath.    aspirin EC 81 MG tablet Take 81 mg by mouth daily.    atorvastatin (LIPITOR) 20 MG tablet Take 20 mg by mouth at bedtime.    !! benztropine (COGENTIN) 0.5 MG tablet Take 0.5 mg by mouth 2 (two) times daily. *Note Dose*    !! benztropine (COGENTIN) 1 MG tablet Take 1 mg by mouth daily as needed for tremors. For muscle stiffness/contracture. *Note dose*    dicyclomine (BENTYL) 20 MG tablet Take 1 tablet (20 mg total) by mouth 3 (three) times daily as needed for spasms. Qty: 30 tablet, Refills: 0    fluPHENAZine (PROLIXIN) 5 MG tablet Take 5-15 mg by mouth See admin instructions. Take 5 mg by mouth every morning and Take 3 tablets (15 mg) by mouth every night at bedtime.    fluPHENAZine decanoate (PROLIXIN) 25 MG/ML injection Inject 1 mL (25 mg total) into the muscle every 14 (fourteen) days. Qty: 5 mL, Refills: 0    Fluticasone-Salmeterol (ADVAIR) 250-50 MCG/DOSE  AEPB Inhale 1 puff into the lungs 2 (two) times daily.    guaiFENesin (ROBITUSSIN) 100 MG/5ML SOLN Take 15 mLs by mouth every 6 (six) hours as needed for cough or to loosen phlegm. *Notify physician if cough persists more than 3 days*    metoCLOPramide (REGLAN) 10 MG tablet Take 1 tablet (10 mg total) by mouth every 6 (six) hours as needed for nausea or vomiting. Qty: 12 tablet, Refills: 1    ranitidine (ZANTAC) 150 MG tablet Take 150 mg by mouth 2 (two) times daily.    tiotropium (SPIRIVA) 18 MCG inhalation capsule Place 1 capsule (18 mcg total) into inhaler and inhale daily. Qty: 30 capsule, Refills: 12     !! - Potential duplicate medications found. Please discuss with provider.    STOP taking these medications     albuterol (PROVENTIL HFA;VENTOLIN HFA) 108 (90 Base) MCG/ACT inhaler  cyclobenzaprine (FLEXERIL) 5 MG tablet      divalproex (DEPAKOTE ER) 500 MG 24 hr tablet      docusate sodium (COLACE) 100 MG capsule      glipiZIDE (GLUCOTROL) 10 MG tablet      insulin glargine (LANTUS) 100 UNIT/ML injection      lisinopril (PRINIVIL,ZESTRIL) 40 MG tablet      meloxicam (MOBIC) 15 MG tablet      sitaGLIPtin (JANUVIA) 100 MG tablet          DISCHARGE INSTRUCTIONS:   1. PCP follow-up in 1-2 weeks 2. Psychiatric follow-up in 1-2 weeks 3. BIPAP at bedtime 4. Palliative care follow-up  DIET:  Cardiac diet  DISCHARGE CONDITION:  FAIR  ACTIVITY:  AS TOLERATED   OXYGEN:  Home Oxygen: Yes.     Oxygen Delivery: 4 liters/min via Patient connected to nasal cannula oxygen  DISCHARGE LOCATION:  nursing home   If you experience worsening of your admission symptoms, develop shortness of breath, life threatening emergency, suicidal or homicidal thoughts you must seek medical attention immediately by calling 911 or calling your MD immediately  if symptoms less severe.  You Must read complete instructions/literature along with all the possible adverse reactions/side  effects for all the Medicines you take and that have been prescribed to you. Take any new Medicines after you have completely understood and accpet all the possible adverse reactions/side effects.   Please note  You were cared for by a hospitalist during your hospital stay. If you have any questions about your discharge medications or the care you received while you were in the hospital after you are discharged, you can call the unit and asked to speak with the hospitalist on call if the hospitalist that took care of you is not available. Once you are discharged, your primary care physician will handle any further medical issues. Please note that NO REFILLS for any discharge medications will be authorized once you are discharged, as it is imperative that you return to your primary care physician (or establish a relationship with a primary care physician if you do not have one) for your aftercare needs so that they can reassess your need for medications and monitor your lab values.     Today  Chief Complaint  Patient presents with  . Shortness of Breath   Patient is doing fine. No overnight concerns    VITAL SIGNS:  Blood pressure 132/67, pulse (!) 59, temperature 98.2 F (36.8 C), temperature source Oral, resp. rate 20, height 5\' 7"  (1.702 m), weight 125.1 kg (275 lb 12.8 oz), SpO2 100 %.  I/O:   Intake/Output Summary (Last 24 hours) at 10/22/15 1556 Last data filed at 10/22/15 1300  Gross per 24 hour  Intake                0 ml  Output                0 ml  Net                0 ml    PHYSICAL EXAMINATION:  GENERAL:  70 y.o.-year-old patient lying in the bed with no acute distress.  EYES: Pupils equal, round, reactive to light and accommodation. No scleral icterus. Extraocular muscles intact.  HEENT: Head atraumatic, normocephalic. Oropharynx and nasopharynx clear.  NECK:  Supple, no jugular venous distention. No thyroid enlargement, no tenderness.  LUNGS: Normal breath sounds  bilaterally, no wheezing, rales,rhonchi or crepitation. No use of accessory muscles of  respiration.  CARDIOVASCULAR: S1, S2 normal. No murmurs, rubs, or gallops.  ABDOMEN: Soft, non-tender, non-distended. Bowel sounds present. No organomegaly or mass.  EXTREMITIES: No pedal edema, cyanosis, or clubbing.  NEUROLOGIC: Cranial nerves II through XII are intact. Muscle strength 3/5 in all extremities. Sensation intact. Gait not checked. Global weakness is present PSYCHIATRIC: The patient is alert and oriented x 3. Interactive but sleeps often SKIN: No obvious rash, lesion, or ulcer.   DATA REVIEW:   CBC  Recent Labs Lab 10/17/15 0521  WBC 13.7*  HGB 12.7  HCT 38.7  PLT 205    Chemistries   Recent Labs Lab 10/20/15 0450  NA 141  K 4.2  CL 100*  CO2 36*  GLUCOSE 116*  BUN 26*  CREATININE 0.79  CALCIUM 8.7*    Cardiac Enzymes No results for input(s): TROPONINI in the last 168 hours.  Microbiology Results  Results for orders placed or performed during the hospital encounter of 10/12/15  MRSA PCR Screening     Status: None   Collection Time: 10/13/15  4:30 AM  Result Value Ref Range Status   MRSA by PCR NEGATIVE NEGATIVE Final    Comment:        The GeneXpert MRSA Assay (FDA approved for NASAL specimens only), is one component of a comprehensive MRSA colonization surveillance program. It is not intended to diagnose MRSA infection nor to guide or monitor treatment for MRSA infections.     RADIOLOGY:  No results found.  EKG:   Orders placed or performed during the hospital encounter of 09/18/15  . ED EKG  . ED EKG  . EKG 12-Lead  . EKG 12-Lead      Management plans discussed with the patient, son and they are in agreement.  CODE STATUS:     Code Status Orders        Start     Ordered   10/14/15 1250  Do not attempt resuscitation (DNR)  Continuous    Question Answer Comment  In the event of cardiac or respiratory ARREST Do not call a "code blue"    In the event of cardiac or respiratory ARREST Do not perform Intubation, CPR, defibrillation or ACLS   In the event of cardiac or respiratory ARREST Use medication by any route, position, wound care, and other measures to relive pain and suffering. May use oxygen, suction and manual treatment of airway obstruction as needed for comfort.      10/14/15 1249    Code Status History    Date Active Date Inactive Code Status Order ID Comments User Context   10/13/2015  2:01 AM 10/14/2015 12:49 PM Full Code [id number]  Mikael Spray, NP ED   10/29/2014  8:43 PM 11/05/2014  8:03 PM Full Code [id number]  Gonzella Lex, MD Inpatient   12-17-202016  7:36 PM 08/12/2014  4:35 PM Full Code [id number]  Sheryle Spray, RN ED   06/29/2014  3:23 PM 07/04/2014 12:50 AM Full Code [id number]  Gladstone Lighter, MD Inpatient   06/24/2011  6:32 PM 07/01/2011 12:02 AM Full Code [id number]  Kathie Dike, MD Inpatient      TOTAL TIME TAKING CARE OF THIS PATIENT: 47minutes.   Note: This dictation was prepared with Dragon dictation along with smaller phrase technology. Any transcriptional errors that result from this process are unintentional.   @[facility] @  on 10/22/2015 at 3:56 PM  Between 7am to 6pm - Pager - [phone]  After 6pm go to [email or web address] - [email or web address]  [facility]  Office  [phone]  CC: Primary care physician; Alvester Chou, NP

## 2015-10-22 NOTE — Clinical Social Work Note (Addendum)
MSW received phone call from [facility] who said they are not going to accept patient to SNF anymore.  MSW to continue bed search for SNF placement, MD and bedside nurse made aware.  Case manager spoke with patient's son and he is in agreement to having MSW look for bed placement in [facility] and [place].  Jones Broom. Norval Morton, MSW [phone]  Mon-Fri 8a-4:30p 10/22/2015 12:34 PM

## 2015-10-22 NOTE — Clinical Social Work Placement (Signed)
   CLINICAL SOCIAL WORK PLACEMENT  NOTE  Date:  10/22/2015  Patient Details  Name: [name] [name] MRN: [id number] Date of Birth: 12-18-1945  Clinical Social Work is seeking post-discharge placement for this patient at the [place] level of care (*CSW will initial, date and re-position this form in  chart as items are completed):  Yes   Patient/family provided with [place] Work Department's list of facilities offering this level of care within the geographic area requested by the patient (or if unable, by the patient's family).  Yes   Patient/family informed of their freedom to choose among providers that offer the needed level of care, that participate in Medicare, Medicaid or managed care program needed by the patient, have an available bed and are willing to accept the patient.  Yes   Patient/family informed of [facility]'s ownership interest in [facility] and [facility], as well as of the fact that they are under no obligation to receive care at these facilities.  PASRR submitted to EDS on 10/17/15     PASRR number received on 10/20/15     Existing PASRR number confirmed on       FL2 transmitted to all facilities in geographic area requested by pt/family on 10/17/15     FL2 transmitted to all facilities within larger geographic area on       Patient informed that his/her managed care company has contracts with or will negotiate with certain facilities, including the following:        Yes   Patient/family informed of bed offers received.  Patient chooses bed at [facility]     Physician recommends and patient chooses bed at      Patient to be transferred to  ([facility] ) on 10/22/15.  Patient to be transferred to facility by  [facility] )     Patient family notified on 10/22/15 of transfer.  Name of family member notified:   ([name] [name] is aware of D/C today. )     PHYSICIAN       Additional  Comment:    _______________________________________________ Noble Bodie, Veronia Beets, LCSW 10/22/2015, 4:09 PM

## 2016-01-13 ENCOUNTER — Encounter (HOSPITAL_COMMUNITY): Payer: Self-pay

## 2016-01-13 ENCOUNTER — Emergency Department (HOSPITAL_COMMUNITY): Payer: Medicare HMO

## 2016-01-13 ENCOUNTER — Emergency Department (HOSPITAL_COMMUNITY)
Admission: EM | Admit: 2016-01-13 | Discharge: 2016-01-14 | Disposition: A | Discharge status: Home / Self Care | Payer: Medicare HMO | Source: Home / Self Care | Attending: Emergency Medicine | Admitting: Emergency Medicine

## 2016-01-13 DIAGNOSIS — I130 Hypertensive heart and chronic kidney disease with heart failure and stage 1 through stage 4 chronic kidney disease, or unspecified chronic kidney disease: Secondary | ICD-10-CM | POA: Insufficient documentation

## 2016-01-13 DIAGNOSIS — I509 Heart failure, unspecified: Secondary | ICD-10-CM

## 2016-01-13 DIAGNOSIS — Z87891 Personal history of nicotine dependence: Secondary | ICD-10-CM | POA: Insufficient documentation

## 2016-01-13 DIAGNOSIS — N189 Chronic kidney disease, unspecified: Secondary | ICD-10-CM

## 2016-01-13 DIAGNOSIS — I5033 Acute on chronic diastolic (congestive) heart failure: Secondary | ICD-10-CM | POA: Insufficient documentation

## 2016-01-13 DIAGNOSIS — I2510 Atherosclerotic heart disease of native coronary artery without angina pectoris: Secondary | ICD-10-CM | POA: Insufficient documentation

## 2016-01-13 DIAGNOSIS — R609 Edema, unspecified: Secondary | ICD-10-CM

## 2016-01-13 DIAGNOSIS — E1122 Type 2 diabetes mellitus with diabetic chronic kidney disease: Secondary | ICD-10-CM

## 2016-01-13 DIAGNOSIS — J9621 Acute and chronic respiratory failure with hypoxia: Secondary | ICD-10-CM | POA: Diagnosis not present

## 2016-01-13 DIAGNOSIS — R4182 Altered mental status, unspecified: Secondary | ICD-10-CM | POA: Diagnosis not present

## 2016-01-13 DIAGNOSIS — Z794 Long term (current) use of insulin: Secondary | ICD-10-CM

## 2016-01-13 DIAGNOSIS — J449 Chronic obstructive pulmonary disease, unspecified: Secondary | ICD-10-CM | POA: Insufficient documentation

## 2016-01-13 NOTE — ED Notes (Signed)
[name] sons phone number [phone]

## 2016-01-13 NOTE — ED Triage Notes (Signed)
Pt arrived via EMS from [facility]. Per EMS her son has not seen her in 3 weeks and when he came to see her today he noticed increase swelling in her legs. Pt denies pain and has full baseline ROM.

## 2016-01-13 NOTE — ED Notes (Signed)
Patient is supposed to wear a CPAP at night but has been refusing it per son

## 2016-01-13 NOTE — ED Provider Notes (Signed)
[facility] DEPT Provider Note   CSN: [id number] Arrival date & time: 01/13/16  2227   By signing my name below, I, [name] [name], attest that this documentation has been prepared under the direction and in the presence of [name] Hacker, MD . Electronically Signed: Dolores [name], Scribe. 01/13/2016. 11:07 PM.  History   Chief Complaint Chief Complaint  Patient presents with  . Leg Swelling   The history is provided by the patient and a relative. No language interpreter was used.    HPI Comments:  [name] [name] is a 70 y.o. female who presents to the Emergency Department complaining of gradual onset constant leg swelling which began over 1 month ago. Pt's son reports that the pt is a resident of [facility] and he has not seen her in a few weeks. He states that upon visintg her tonight he noticed her leg swelling had worsened and she was short of breath which led him to bring her to the ED. Pt denies any CP, cough or fever. She is currently on at-home oxygen (2L) as well as a nightly CPAP machine, but her son reports that she has been refusing her CPAP. She is compliant with her daily Lasix prescription, which has recently been increased.   Past Medical History:  Diagnosis Date  . Anxiety   . Arthritis   . Bronchitis   . Bursitis   . CAD (coronary artery disease)   . CHF (congestive heart failure) ([place])   . Chronic cough   . Chronic kidney disease    shadow on x-ray  . COPD (chronic obstructive pulmonary disease) ([place])   . Depression   . DM2 (diabetes mellitus, type 2) ([place])   . Environmental allergies   . GERD (gastroesophageal reflux disease)   . Hypercholesteremia   . Hypertension   . Left ventricular outflow tract obstruction    a. echo 03/2014: EF 60-65%, hypernamic LV systolic fxn, mod LVH w/ LVOT gradient estimated at 68 mm Hg w/ valsalva, very small LV internal cavity size, mildly increased LV posterior wall thickness, mild Ao valve scl w/o  stenosis, diastolic dysfunction, normal RVSP  . Lower extremity edema   . LVH (left ventricular hypertrophy)    a. echo suggests long standing uncontrolled htn. she will not do well when dehydrated, LV cavity obliteration  . Muscle weakness   . Obesity   . On supplemental oxygen therapy    AS NEEDED  . OSA (obstructive sleep apnea)    does not use machine  . Osteoarthritis   . Schizophrenia ([place])   . Tremors of nervous system   . Wheezing     Patient Active Problem List   Diagnosis Date Noted  . Acute respiratory failure ([place]) 10/13/2015  . Involuntary commitment 09/02/2015  . Schizoaffective disorder, bipolar type ([place])   . Urinary incontinence 10/30/2014  . GERD (gastroesophageal reflux disease) 10/30/2014  . OSA (obstructive sleep apnea) 10/30/2014  . Osteoarthrosis, unspecified whether generalized or localized, involving lower leg 10/30/2014  . COPD (chronic obstructive pulmonary disease) ([place]) 10/30/2014  . Chronic diastolic CHF (congestive heart failure) ([place]) 05/15/2014  . Obesity   . History of colon polyps 03/09/2013  . Renal mass 06/26/2011  . Lytic bone lesion of hip 06/25/2011  . Hypertension 06/24/2011  . Diabetes mellitus ([place]) 06/24/2011    Past Surgical History:  Procedure Laterality Date  . CATARACT EXTRACTION W/PHACO Left 09/10/2014   Procedure: CATARACT EXTRACTION PHACO AND INTRAOCULAR LENS PLACEMENT (IOC);  Surgeon: Birder Robson,  MD;  Location: [facility] ORS;  Service: Ophthalmology;  Laterality: Left;  [place]: 00:44 AP%: 22.8 CDE: 10.24 Fluid lot [id number]  . CATARACT EXTRACTION W/PHACO Right 10/15/2014   Procedure: CATARACT EXTRACTION PHACO AND INTRAOCULAR LENS PLACEMENT (IOC);  Surgeon: Birder Robson, MD;  Location: [facility] ORS;  Service: Ophthalmology;  Laterality: Right;  [place]: 00:52 AP:40.1 CDE:11.83 LOT PACK [id number]  . CHOLECYSTECTOMY    . COLONOSCOPY  04/2011   [facility] per patient incomplete  . EYE SURGERY    . JOINT REPLACEMENT     TKR  . KNEE  RECONSTRUCTION, MEDIAL PATELLAR FEMORAL LIGAMENT    . RENAL BIOPSY    . TONSILLECTOMY    . TONSILLECTOMY      OB History    Gravida Para Term Preterm AB Living   2         2   SAB TAB Ectopic Multiple Live Births                   Home Medications    Prior to Admission medications   Medication Sig Start Date End Date Taking? Authorizing Provider  acetaminophen (TYLENOL) 325 MG tablet Take 2 tablets (650 mg total) by mouth every 4 (four) hours as needed for mild pain (temp > 101.5). 10/20/15  Yes Gladstone Lighter, MD  albuterol (ACCUNEB) 1.25 MG/3ML nebulizer solution Take 3 mLs by nebulization 3 (three) times daily as needed for wheezing or shortness of breath.   Yes Historical Provider, MD  Amino Acids-Protein Hydrolys (FEEDING SUPPLEMENT, PRO-STAT SUGAR FREE 64,) LIQD Take 30 mLs by mouth 2 (two) times daily.   Yes Historical Provider, MD  atorvastatin (LIPITOR) 20 MG tablet Take 20 mg by mouth at bedtime.   Yes Historical Provider, MD  benztropine (COGENTIN) 0.5 MG tablet Take 0.5 mg by mouth 2 (two) times daily. *   Yes Historical Provider, MD  benztropine (COGENTIN) 1 MG tablet Take 1 mg by mouth daily as needed for tremors. For muscle stiffness/contracture. *Note dose*   Yes Historical Provider, MD  carvedilol (COREG) 12.5 MG tablet Take 1 tablet (12.5 mg total) by mouth 2 (two) times daily. 10/20/15  Yes Gladstone Lighter, MD  dicyclomine (BENTYL) 20 MG tablet Take 1 tablet (20 mg total) by mouth 3 (three) times daily as needed for spasms. 11/30/14 01/13/16 Yes Orbie Pyo, MD  divalproex (DEPAKOTE) 500 MG DR tablet Take 1 tablet (500 mg total) by mouth 2 (two) times daily. Patient taking differently: Take 1,000 mg by mouth at bedtime.  10/20/15  Yes Gladstone Lighter, MD  fluPHENAZine (PROLIXIN) 10 MG tablet Take 10 mg by mouth daily.   Yes Historical Provider, MD  fluPHENAZine decanoate (PROLIXIN) 25 MG/ML injection Inject 1 mL (25 mg total) into the muscle every 14  (fourteen) days. 11/14/14  Yes Hildred Priest, MD  Fluticasone-Salmeterol (ADVAIR) 250-50 MCG/DOSE AEPB Inhale 1 puff into the lungs 2 (two) times daily.   Yes Historical Provider, MD  furosemide (LASIX) 40 MG tablet Take 40 mg by mouth 2 (two) times daily.   Yes Historical Provider, MD  guaiFENesin (ROBITUSSIN) 100 MG/5ML SOLN Take 15 mLs by mouth every 6 (six) hours as needed for cough or to loosen phlegm. *Notify physician if cough persists more than 3 days*   Yes Historical Provider, MD  insulin aspart (NOVOLOG) 100 UNIT/ML injection Inject 2-10 Units into the skin 4 (four) times daily -  before meals and at bedtime. Sliding scale   Yes Historical Provider, MD  insulin glargine (LANTUS) 100  UNIT/ML injection Inject 10 Units into the skin at bedtime.   Yes Historical Provider, MD  LORazepam (ATIVAN) 0.5 MG tablet Take 1 tablet (0.5 mg total) by mouth every 8 (eight) hours as needed for anxiety. Pt is also able to take an additional tablet daily if needed for anxiety. 10/20/15  Yes Gladstone Lighter, MD  magnesium hydroxide (MILK OF MAGNESIA) 400 MG/5ML suspension Take 30 mLs by mouth daily as needed for mild constipation.   Yes Historical Provider, MD  metoCLOPramide (REGLAN) 10 MG tablet Take 1 tablet (10 mg total) by mouth every 6 (six) hours as needed for nausea or vomiting. 11/30/14  Yes Orbie Pyo, MD  Multiple Vitamin (MULTIVITAMIN WITH MINERALS) TABS tablet Take 1 tablet by mouth daily.   Yes Historical Provider, MD  ranitidine (ZANTAC) 150 MG tablet Take 150 mg by mouth 2 (two) times daily.   Yes Historical Provider, MD  tiotropium (SPIRIVA) 18 MCG inhalation capsule Place 1 capsule (18 mcg total) into inhaler and inhale daily. 07/02/14  Yes [name], MD  traMADol (ULTRAM) 50 MG tablet Take 1 tablet (50 mg total) by mouth every 6 (six) hours as needed for moderate pain. 10/20/15  Yes Gladstone Lighter, MD  verapamil (CALAN) 120 MG tablet Take 1 tablet (120 mg total)  by mouth every 12 (twelve) hours. 10/20/15  Yes Gladstone Lighter, MD  furosemide (LASIX) 20 MG tablet Take 1 tablet (20 mg total) by mouth daily. Patient not taking: Reported on 01/14/2016 10/20/15   Gladstone Lighter, MD    Family History Family History  Problem Relation Age of Onset  . Heart attack Mother   . Colon cancer Neg Hx   . Liver disease Neg Hx     Social History Social History  Substance Use Topics  . Smoking status: Former Smoker    Packs/day: 3.00    Years: 40.00  . Smokeless tobacco: Never Used     Comment: quit in 2009  . Alcohol use No     Comment: occ.     Allergies   Haldol [haloperidol decanoate]; Metformin; Prednisone; and Raspberry   Review of Systems Review of Systems  Constitutional: Negative for fever.  Respiratory: Positive for shortness of breath. Negative for cough.   Cardiovascular: Positive for leg swelling. Negative for chest pain.  Gastrointestinal: Negative for abdominal pain, nausea and vomiting.  Genitourinary: Negative for dysuria.  All other systems reviewed and are negative.    Physical Exam Updated Vital Signs BP 149/71   Pulse 66   Temp 98.5 F (36.9 C) (Oral)   Resp (!) 28   Ht 5\' 5"  (1.651 m)   Wt 285 lb (129.3 kg)   SpO2 98%   BMI 47.43 kg/m   Physical Exam  Constitutional: She is oriented to person, place, and time. No distress.  Chronically ill-appearing, obese  HENT:  Head: Normocephalic and atraumatic.  Cardiovascular: Normal rate and normal heart sounds.   Irregular rhythm  Pulmonary/Chest: Effort normal. No respiratory distress. She has no wheezes.  Nasal cannula in place, a fair movement, exam limited by body habitus  Abdominal: Soft. Bowel sounds are normal. There is no tenderness. There is no guarding.  Musculoskeletal: She exhibits edema.  2+ bilateral lower extremity edema, pitting  Neurological: She is alert and oriented to person, place, and time.  Skin: Skin is warm and dry.  Psychiatric: She  has a normal mood and affect.  Nursing note and vitals reviewed.    ED Treatments / Results  DIAGNOSTIC STUDIES:  Oxygen Saturation is 96% on RA, normal by my interpretation.    COORDINATION OF CARE:  11:15 PM Discussed treatment plan with pt at bedside which includes lab work and pt agreed to plan.  Labs (all labs ordered are listed, but only abnormal results are displayed) Labs Reviewed  CBC WITH DIFFERENTIAL/PLATELET - Abnormal; Notable for the following:       Result Value   WBC 11.6 (*)    RDW 15.8 (*)    Monocytes Absolute 1.6 (*)    All other components within normal limits  BASIC METABOLIC PANEL - Abnormal; Notable for the following:    Chloride 96 (*)    CO2 34 (*)    Glucose, Bld 169 (*)    GFR calc non Af Amer 59 (*)    All other components within normal limits  I-STAT ARTERIAL BLOOD GAS, ED - Abnormal; Notable for the following:    pCO2 arterial 54.0 (*)    pO2, Arterial 68.0 (*)    Bicarbonate 37.0 (*)    Acid-Base Excess 11.0 (*)    All other components within normal limits  BRAIN NATRIURETIC PEPTIDE    EKG  EKG Interpretation  Date/Time:  Tuesday January 13 2016 23:38:54 EST Ventricular Rate:  71 PR Interval:    QRS Duration: 100 QT Interval:  390 QTC Calculation: 424 R Axis:   14 Text Interpretation:  Atrial fibrillation Nonspecific T abnormalities, diffuse leads Minimal ST elevation, anterolateral leads Confirmed by Dina Rich  MD, COURTNEY ([id number]) on 01/14/2016 12:15:34 AM       Radiology Dg Chest 2 View  Result Date: 01/14/2016 CLINICAL DATA:  Acute onset of shortness of breath and left shoulder pain. Initial encounter. EXAM: CHEST  2 VIEW COMPARISON:  Chest radiograph performed 10/14/2015, and CTA of the chest performed 09/18/2015 FINDINGS: The lungs are well-aerated. Mild vascular congestion is noted. There is no evidence of focal opacification, pleural effusion or pneumothorax. The heart is borderline normal in size. No acute osseous  abnormalities are seen. IMPRESSION: Mild vascular congestion noted.  Lungs remain grossly clear. Electronically Signed   By: Garald Balding M.D.   On: 01/14/2016 00:07    Procedures Procedures (including critical care time)  Medications Ordered in ED Medications  furosemide (LASIX) injection 40 mg (not administered)     Initial Impression / Assessment and Plan / ED Course  I have reviewed the triage vital signs and the nursing notes.  Pertinent labs & imaging results that were available during my care of the patient were reviewed by me and considered in my medical decision making (see chart for details).  Clinical Course     Patient presents with concerns for peripheral edema. Son is also concerned that she is not wearing her seat At night. She is chronically ill-appearing. No acute distress. Baseline home oxygen. ABG without evidence of acute hypercarbia. She does have evidence of lower extremity edema. Labwork reassuring. She was given 1 dose of 40 mg of Lasix. Feel she is safe for discharge back to her living facility. Continue adjusted Lasix at the facility and follow-up with primary physician for further adjustment.  After history, exam, and medical workup I feel the patient has been appropriately medically screened and is safe for discharge home. Pertinent diagnoses were discussed with the patient. Patient was given return precautions.   Final Clinical Impressions(s) / ED Diagnoses   Final diagnoses:  Peripheral edema  Acute on chronic congestive heart failure, unspecified congestive heart failure type ([place])  New Prescriptions New Prescriptions   No medications on file   I personally performed the services described in this documentation, which was scribed in my presence. The recorded information has been reviewed and is accurate.     [name] Hacker, MD 01/14/16 [phone]

## 2016-01-14 LAB — BASIC METABOLIC PANEL
Anion gap: 8 (ref 5–15)
BUN: 16 mg/dL (ref 6–20)
CHLORIDE: 96 mmol/L — AB (ref 101–111)
CO2: 34 mmol/L — AB (ref 22–32)
CREATININE: 0.95 mg/dL (ref 0.44–1.00)
Calcium: 9.2 mg/dL (ref 8.9–10.3)
GFR calc non Af Amer: 59 mL/min — ABNORMAL LOW (ref >60)
GLUCOSE: 169 mg/dL — AB (ref 65–99)
Potassium: 4.1 mmol/L (ref 3.5–5.1)
Sodium: 138 mmol/L (ref 135–145)

## 2016-01-14 LAB — CBC WITH DIFFERENTIAL/PLATELET
BASOS ABS: 0 10*3/uL (ref 0.0–0.1)
BASOS PCT: 0 %
EOS PCT: 1 %
Eosinophils Absolute: 0.1 10*3/uL (ref 0.0–0.7)
HEMATOCRIT: 42.6 % (ref 36.0–46.0)
HEMOGLOBIN: 13.7 g/dL (ref 12.0–15.0)
LYMPHS PCT: 33 %
Lymphs Abs: 3.8 10*3/uL (ref 0.7–4.0)
MCH: 28.9 pg (ref 26.0–34.0)
MCHC: 32.2 g/dL (ref 30.0–36.0)
MCV: 89.9 fL (ref 78.0–100.0)
MONOS PCT: 14 %
Monocytes Absolute: 1.6 10*3/uL — ABNORMAL HIGH (ref 0.1–1.0)
NEUTROS ABS: 6.1 10*3/uL (ref 1.7–7.7)
Neutrophils Relative %: 52 %
Platelets: 238 10*3/uL (ref 150–400)
RBC: 4.74 MIL/uL (ref 3.87–5.11)
RDW: 15.8 % — ABNORMAL HIGH (ref 11.5–15.5)
WBC: 11.6 10*3/uL — ABNORMAL HIGH (ref 4.0–10.5)

## 2016-01-14 LAB — I-STAT ARTERIAL BLOOD GAS, ED
Acid-Base Excess: 11 mmol/L — ABNORMAL HIGH (ref 0.0–2.0)
Bicarbonate: 37 mmol/L — ABNORMAL HIGH (ref 20.0–28.0)
O2 SAT: 93 %
PCO2 ART: 54 mmHg — AB (ref 32.0–48.0)
PH ART: 7.444 (ref 7.350–7.450)
PO2 ART: 68 mmHg — AB (ref 83.0–108.0)
Patient temperature: 98.6
TCO2: 39 mmol/L (ref 0–100)

## 2016-01-14 LAB — BRAIN NATRIURETIC PEPTIDE: B Natriuretic Peptide: 53.7 pg/mL (ref 0.0–100.0)

## 2016-01-14 MED ORDER — FUROSEMIDE 10 MG/ML IJ SOLN: 40.0000 mg | Freq: Once | INTRAMUSCULAR | Status: DC

## 2016-01-14 MED ORDER — FUROSEMIDE 10 MG/ML IJ SOLN
40.0000 mg | Freq: Once | INTRAMUSCULAR | Status: AC
  Administered 2016-01-14: 40 mg via INTRAVENOUS

## 2016-01-14 MED ORDER — FUROSEMIDE 10 MG/ML IJ SOLN
40.0000 mg | Freq: Once | INTRAMUSCULAR | Status: DC
  Filled 2016-01-14: qty 4

## 2016-01-14 NOTE — ED Notes (Signed)
[facility] here to transport patient back to [facility]; all required documents provided

## 2016-01-14 NOTE — Discharge Instructions (Signed)
You were given an additional dose of Lasix here in the emergency room. Continue Lasix at home as prescribed. Follow-up with her primary physician for adjustments of medication.

## 2016-01-14 NOTE — ED Notes (Signed)
[facility] contacted to discharge pt back to [facility]

## 2016-01-14 NOTE — ED Notes (Signed)
[facility] called at [phone]- TY

## 2016-01-14 NOTE — ED Notes (Signed)
Furosemide IM discontinued due to patient refusal. RN explained to pt that this procedure would be a quick injection with a small needle and pt stated that "she was tired of being stuck in the arms" even after explaining that an IV may take multiple sticks due to lack of access

## 2016-01-14 NOTE — ED Notes (Signed)
IV team at bedside 

## 2016-01-14 NOTE — ED Notes (Signed)
Report called and given to nurse at [facility]

## 2016-01-15 ENCOUNTER — Emergency Department (HOSPITAL_COMMUNITY): Payer: Medicare HMO

## 2016-01-15 ENCOUNTER — Inpatient Hospital Stay (HOSPITAL_COMMUNITY)
Admission: EM | Admit: 2016-01-15 | Discharge: 2016-01-21 | DRG: 189 | Disposition: A | Discharge status: Skilled Nursing Facility | Payer: Medicare HMO | Attending: Internal Medicine | Admitting: Internal Medicine

## 2016-01-15 ENCOUNTER — Encounter (HOSPITAL_COMMUNITY): Payer: Self-pay | Admitting: Nurse Practitioner

## 2016-01-15 DIAGNOSIS — E1122 Type 2 diabetes mellitus with diabetic chronic kidney disease: Secondary | ICD-10-CM | POA: Diagnosis present

## 2016-01-15 DIAGNOSIS — M1990 Unspecified osteoarthritis, unspecified site: Secondary | ICD-10-CM | POA: Diagnosis present

## 2016-01-15 DIAGNOSIS — Z1612 Extended spectrum beta lactamase (ESBL) resistance: Secondary | ICD-10-CM | POA: Diagnosis present

## 2016-01-15 DIAGNOSIS — I5032 Chronic diastolic (congestive) heart failure: Secondary | ICD-10-CM | POA: Diagnosis present

## 2016-01-15 DIAGNOSIS — Z888 Allergy status to other drugs, medicaments and biological substances status: Secondary | ICD-10-CM

## 2016-01-15 DIAGNOSIS — I2510 Atherosclerotic heart disease of native coronary artery without angina pectoris: Secondary | ICD-10-CM | POA: Diagnosis present

## 2016-01-15 DIAGNOSIS — Z9841 Cataract extraction status, right eye: Secondary | ICD-10-CM

## 2016-01-15 DIAGNOSIS — N189 Chronic kidney disease, unspecified: Secondary | ICD-10-CM | POA: Diagnosis present

## 2016-01-15 DIAGNOSIS — Z87891 Personal history of nicotine dependence: Secondary | ICD-10-CM

## 2016-01-15 DIAGNOSIS — J9621 Acute and chronic respiratory failure with hypoxia: Principal | ICD-10-CM | POA: Diagnosis present

## 2016-01-15 DIAGNOSIS — K5900 Constipation, unspecified: Secondary | ICD-10-CM | POA: Diagnosis present

## 2016-01-15 DIAGNOSIS — F250 Schizoaffective disorder, bipolar type: Secondary | ICD-10-CM | POA: Diagnosis present

## 2016-01-15 DIAGNOSIS — Z9119 Patient's noncompliance with other medical treatment and regimen: Secondary | ICD-10-CM

## 2016-01-15 DIAGNOSIS — M25519 Pain in unspecified shoulder: Secondary | ICD-10-CM

## 2016-01-15 DIAGNOSIS — I10 Essential (primary) hypertension: Secondary | ICD-10-CM | POA: Diagnosis present

## 2016-01-15 DIAGNOSIS — Z961 Presence of intraocular lens: Secondary | ICD-10-CM | POA: Diagnosis present

## 2016-01-15 DIAGNOSIS — E7800 Pure hypercholesterolemia, unspecified: Secondary | ICD-10-CM | POA: Diagnosis present

## 2016-01-15 DIAGNOSIS — N12 Tubulo-interstitial nephritis, not specified as acute or chronic: Secondary | ICD-10-CM

## 2016-01-15 DIAGNOSIS — G4733 Obstructive sleep apnea (adult) (pediatric): Secondary | ICD-10-CM | POA: Diagnosis present

## 2016-01-15 DIAGNOSIS — Z993 Dependence on wheelchair: Secondary | ICD-10-CM

## 2016-01-15 DIAGNOSIS — N2889 Other specified disorders of kidney and ureter: Secondary | ICD-10-CM | POA: Diagnosis present

## 2016-01-15 DIAGNOSIS — F419 Anxiety disorder, unspecified: Secondary | ICD-10-CM | POA: Diagnosis present

## 2016-01-15 DIAGNOSIS — N390 Urinary tract infection, site not specified: Secondary | ICD-10-CM | POA: Diagnosis present

## 2016-01-15 DIAGNOSIS — I130 Hypertensive heart and chronic kidney disease with heart failure and stage 1 through stage 4 chronic kidney disease, or unspecified chronic kidney disease: Secondary | ICD-10-CM | POA: Diagnosis present

## 2016-01-15 DIAGNOSIS — B9620 Unspecified Escherichia coli [E. coli] as the cause of diseases classified elsewhere: Secondary | ICD-10-CM | POA: Diagnosis present

## 2016-01-15 DIAGNOSIS — Z9842 Cataract extraction status, left eye: Secondary | ICD-10-CM

## 2016-01-15 DIAGNOSIS — J9622 Acute and chronic respiratory failure with hypercapnia: Secondary | ICD-10-CM | POA: Diagnosis present

## 2016-01-15 DIAGNOSIS — Z794 Long term (current) use of insulin: Secondary | ICD-10-CM

## 2016-01-15 DIAGNOSIS — G9341 Metabolic encephalopathy: Secondary | ICD-10-CM

## 2016-01-15 DIAGNOSIS — Z6841 Body Mass Index (BMI) 40.0 and over, adult: Secondary | ICD-10-CM

## 2016-01-15 DIAGNOSIS — K219 Gastro-esophageal reflux disease without esophagitis: Secondary | ICD-10-CM | POA: Diagnosis present

## 2016-01-15 DIAGNOSIS — R627 Adult failure to thrive: Secondary | ICD-10-CM | POA: Diagnosis present

## 2016-01-15 DIAGNOSIS — Z9981 Dependence on supplemental oxygen: Secondary | ICD-10-CM

## 2016-01-15 DIAGNOSIS — Z66 Do not resuscitate: Secondary | ICD-10-CM | POA: Diagnosis present

## 2016-01-15 DIAGNOSIS — E119 Type 2 diabetes mellitus without complications: Secondary | ICD-10-CM

## 2016-01-15 DIAGNOSIS — J449 Chronic obstructive pulmonary disease, unspecified: Secondary | ICD-10-CM | POA: Diagnosis present

## 2016-01-15 DIAGNOSIS — I959 Hypotension, unspecified: Secondary | ICD-10-CM | POA: Diagnosis present

## 2016-01-15 DIAGNOSIS — Z79899 Other long term (current) drug therapy: Secondary | ICD-10-CM

## 2016-01-15 DIAGNOSIS — R4182 Altered mental status, unspecified: Secondary | ICD-10-CM | POA: Diagnosis present

## 2016-01-15 LAB — COMPREHENSIVE METABOLIC PANEL
ALBUMIN: 3.7 g/dL (ref 3.5–5.0)
ALK PHOS: 71 U/L (ref 38–126)
ALT: 14 U/L (ref 14–54)
ANION GAP: 10 (ref 5–15)
AST: 17 U/L (ref 15–41)
BILIRUBIN TOTAL: 0.8 mg/dL (ref 0.3–1.2)
BUN: 17 mg/dL (ref 6–20)
CALCIUM: 9 mg/dL (ref 8.9–10.3)
CO2: 34 mmol/L — ABNORMAL HIGH (ref 22–32)
Chloride: 95 mmol/L — ABNORMAL LOW (ref 101–111)
Creatinine, Ser: 0.96 mg/dL (ref 0.44–1.00)
GFR, EST NON AFRICAN AMERICAN: 58 mL/min — AB (ref >60)
Glucose, Bld: 254 mg/dL — ABNORMAL HIGH (ref 65–99)
POTASSIUM: 3.6 mmol/L (ref 3.5–5.1)
Sodium: 139 mmol/L (ref 135–145)
TOTAL PROTEIN: 8.7 g/dL — AB (ref 6.5–8.1)

## 2016-01-15 LAB — URINALYSIS, ROUTINE W REFLEX MICROSCOPIC
Bilirubin Urine: NEGATIVE
Glucose, UA: >1000 mg/dL — AB
KETONES UR: NEGATIVE mg/dL
NITRITE: POSITIVE — AB
PH: 6 (ref 5.0–8.0)
PROTEIN: NEGATIVE mg/dL
Specific Gravity, Urine: 1.018 (ref 1.005–1.030)

## 2016-01-15 LAB — CBC WITH DIFFERENTIAL/PLATELET
BASOS ABS: 0 10*3/uL (ref 0.0–0.1)
BASOS PCT: 0 %
Eosinophils Absolute: 0.1 10*3/uL (ref 0.0–0.7)
Eosinophils Relative: 1 %
HEMATOCRIT: 43.5 % (ref 36.0–46.0)
Hemoglobin: 13.8 g/dL (ref 12.0–15.0)
LYMPHS PCT: 28 %
Lymphs Abs: 3.5 10*3/uL (ref 0.7–4.0)
MCH: 28.9 pg (ref 26.0–34.0)
MCHC: 31.7 g/dL (ref 30.0–36.0)
MCV: 91 fL (ref 78.0–100.0)
MONO ABS: 2.9 10*3/uL — AB (ref 0.1–1.0)
Monocytes Relative: 23 %
NEUTROS ABS: 6 10*3/uL (ref 1.7–7.7)
NEUTROS PCT: 48 %
Platelets: 230 10*3/uL (ref 150–400)
RBC: 4.78 MIL/uL (ref 3.87–5.11)
RDW: 15.6 % — AB (ref 11.5–15.5)
WBC: 12.5 10*3/uL — AB (ref 4.0–10.5)

## 2016-01-15 LAB — URINE MICROSCOPIC-ADD ON

## 2016-01-15 NOTE — ED Provider Notes (Signed)
[facility] DEPT Provider Note   CSN: [id number] Arrival date & time: 01/15/16  2146     History   Chief Complaint Chief Complaint  Patient presents with  . Recurrent UTI  . Altered Mental Status    HPI [name] [name] is a 70 y.o. female.  The history is provided by the patient. No language interpreter was used.  Altered Mental Status     [name] [name] is a 70 y.o. female who presents to the Emergency Department complaining of flank pain.  Level V caveat due to psychiatric disorder. She presents by EMS for evaluation of right flank pain. Per report this as symptoms of her prior UTIs. She has a history of calling the police at her facility and asking to go to the hospital so she had to ask multiple times before staff would call for transfer to the hospital. She reports right flank pain that started today. Per nursing facility she has been more somnolent than usual. No reports of fevers, vomiting, diarrhea. Past Medical History:  Diagnosis Date  . Anxiety   . Arthritis   . Bronchitis   . Bursitis   . CAD (coronary artery disease)   . CHF (congestive heart failure) ([place])   . Chronic cough   . Chronic kidney disease    shadow on x-ray  . COPD (chronic obstructive pulmonary disease) ([place])   . Depression   . DM2 (diabetes mellitus, type 2) ([place])   . Environmental allergies   . GERD (gastroesophageal reflux disease)   . Hypercholesteremia   . Hypertension   . Left ventricular outflow tract obstruction    a. echo 03/2014: EF 60-65%, hypernamic LV systolic fxn, mod LVH w/ LVOT gradient estimated at 68 mm Hg w/ valsalva, very small LV internal cavity size, mildly increased LV posterior wall thickness, mild Ao valve scl w/o stenosis, diastolic dysfunction, normal RVSP  . Lower extremity edema   . LVH (left ventricular hypertrophy)    a. echo suggests long standing uncontrolled htn. she will not do well when dehydrated, LV cavity obliteration  . Muscle weakness   . Obesity     . On supplemental oxygen therapy    AS NEEDED  . OSA (obstructive sleep apnea)    does not use machine  . Osteoarthritis   . Schizophrenia ([place])   . Tremors of nervous system   . Wheezing     Patient Active Problem List   Diagnosis Date Noted  . Acute respiratory failure ([place]) 10/13/2015  . Involuntary commitment 09/02/2015  . Schizoaffective disorder, bipolar type ([place])   . Urinary incontinence 10/30/2014  . GERD (gastroesophageal reflux disease) 10/30/2014  . OSA (obstructive sleep apnea) 10/30/2014  . Osteoarthrosis, unspecified whether generalized or localized, involving lower leg 10/30/2014  . COPD (chronic obstructive pulmonary disease) ([place]) 10/30/2014  . Chronic diastolic CHF (congestive heart failure) ([place]) 05/15/2014  . Obesity   . History of colon polyps 03/09/2013  . Renal mass 06/26/2011  . Lytic bone lesion of hip 06/25/2011  . Hypertension 06/24/2011  . Diabetes mellitus ([place]) 06/24/2011    Past Surgical History:  Procedure Laterality Date  . CATARACT EXTRACTION W/PHACO Left 09/10/2014   Procedure: CATARACT EXTRACTION PHACO AND INTRAOCULAR LENS PLACEMENT (IOC);  Surgeon: Birder Robson, MD;  Location: [facility] ORS;  Service: Ophthalmology;  Laterality: Left;  [place]: 00:44 AP%: 22.8 CDE: 10.24 Fluid lot [id number]  . CATARACT EXTRACTION W/PHACO Right 10/15/2014   Procedure: CATARACT EXTRACTION PHACO AND INTRAOCULAR LENS PLACEMENT (IOC);  Surgeon: Gwyndolyn Saxon  Porfilio, MD;  Location: [facility] ORS;  Service: Ophthalmology;  Laterality: Right;  [place]: 00:52 AP:40.1 CDE:11.83 LOT PACK [id number]  . CHOLECYSTECTOMY    . COLONOSCOPY  04/2011   [facility] per patient incomplete  . EYE SURGERY    . JOINT REPLACEMENT     TKR  . KNEE RECONSTRUCTION, MEDIAL PATELLAR FEMORAL LIGAMENT    . RENAL BIOPSY    . TONSILLECTOMY    . TONSILLECTOMY      OB History    Gravida Para Term Preterm AB Living   2         2   SAB TAB Ectopic Multiple Live Births                   Home  Medications    Prior to Admission medications   Medication Sig Start Date End Date Taking? Authorizing Provider  acetaminophen (TYLENOL) 325 MG tablet Take 2 tablets (650 mg total) by mouth every 4 (four) hours as needed for mild pain (temp > 101.5). 10/20/15  Yes Gladstone Lighter, MD  albuterol (ACCUNEB) 1.25 MG/3ML nebulizer solution Take 3 mLs by nebulization 3 (three) times daily as needed for wheezing or shortness of breath.   Yes Historical Provider, MD  Amino Acids-Protein Hydrolys (FEEDING SUPPLEMENT, PRO-STAT SUGAR FREE 64,) LIQD Take 30 mLs by mouth 2 (two) times daily.   Yes Historical Provider, MD  atorvastatin (LIPITOR) 20 MG tablet Take 20 mg by mouth at bedtime.   Yes Historical Provider, MD  benztropine (COGENTIN) 0.5 MG tablet Take 0.5 mg by mouth 2 (two) times daily. *   Yes Historical Provider, MD  benztropine (COGENTIN) 1 MG tablet Take 1 mg by mouth daily as needed for tremors. For muscle stiffness/contracture. *Note dose*   Yes Historical Provider, MD  carvedilol (COREG) 12.5 MG tablet Take 1 tablet (12.5 mg total) by mouth 2 (two) times daily. 10/20/15  Yes Gladstone Lighter, MD  dicyclomine (BENTYL) 20 MG tablet Take 1 tablet (20 mg total) by mouth 3 (three) times daily as needed for spasms. 11/30/14 01/15/16 Yes Orbie Pyo, MD  divalproex (DEPAKOTE) 500 MG DR tablet Take 1 tablet (500 mg total) by mouth 2 (two) times daily. Patient taking differently: Take 1,000 mg by mouth at bedtime.  10/20/15  Yes Gladstone Lighter, MD  fluPHENAZine (PROLIXIN) 10 MG tablet Take 10 mg by mouth daily.   Yes Historical Provider, MD  fluPHENAZine decanoate (PROLIXIN) 25 MG/ML injection Inject 1 mL (25 mg total) into the muscle every 14 (fourteen) days. 11/14/14  Yes Hildred Priest, MD  Fluticasone-Salmeterol (ADVAIR) 250-50 MCG/DOSE AEPB Inhale 1 puff into the lungs 2 (two) times daily.   Yes Historical Provider, MD  furosemide (LASIX) 40 MG tablet Take 40 mg by mouth 2  (two) times daily.   Yes Historical Provider, MD  guaiFENesin (ROBITUSSIN) 100 MG/5ML SOLN Take 15 mLs by mouth every 6 (six) hours as needed for cough or to loosen phlegm. *Notify physician if cough persists more than 3 days*   Yes Historical Provider, MD  insulin aspart (NOVOLOG) 100 UNIT/ML injection Inject 2-10 Units into the skin 4 (four) times daily -  before meals and at bedtime. Sliding scale   Yes Historical Provider, MD  insulin glargine (LANTUS) 100 UNIT/ML injection Inject 10 Units into the skin at bedtime.   Yes Historical Provider, MD  LORazepam (ATIVAN) 0.5 MG tablet Take 1 tablet (0.5 mg total) by mouth every 8 (eight) hours as needed for anxiety. Pt is also able  to take an additional tablet daily if needed for anxiety. 10/20/15  Yes Gladstone Lighter, MD  magnesium hydroxide (MILK OF MAGNESIA) 400 MG/5ML suspension Take 30 mLs by mouth daily as needed for mild constipation.   Yes Historical Provider, MD  metoCLOPramide (REGLAN) 10 MG tablet Take 1 tablet (10 mg total) by mouth every 6 (six) hours as needed for nausea or vomiting. 11/30/14  Yes Orbie Pyo, MD  Multiple Vitamin (MULTIVITAMIN WITH MINERALS) TABS tablet Take 1 tablet by mouth daily.   Yes Historical Provider, MD  ranitidine (ZANTAC) 150 MG tablet Take 150 mg by mouth 2 (two) times daily.   Yes Historical Provider, MD  tiotropium (SPIRIVA) 18 MCG inhalation capsule Place 1 capsule (18 mcg total) into inhaler and inhale daily. 07/02/14  Yes [name], MD  traMADol (ULTRAM) 50 MG tablet Take 1 tablet (50 mg total) by mouth every 6 (six) hours as needed for moderate pain. 10/20/15  Yes Gladstone Lighter, MD  verapamil (CALAN) 120 MG tablet Take 1 tablet (120 mg total) by mouth every 12 (twelve) hours. 10/20/15  Yes Gladstone Lighter, MD  furosemide (LASIX) 20 MG tablet Take 1 tablet (20 mg total) by mouth daily. Patient not taking: Reported on 01/14/2016 10/20/15   Gladstone Lighter, MD    Family History Family  History  Problem Relation Age of Onset  . Heart attack Mother   . Colon cancer Neg Hx   . Liver disease Neg Hx     Social History Social History  Substance Use Topics  . Smoking status: Former Smoker    Packs/day: 3.00    Years: 40.00  . Smokeless tobacco: Never Used     Comment: quit in 2009  . Alcohol use No     Comment: occ.     Allergies   Haldol [haloperidol decanoate]; Metformin; Prednisone; and Raspberry   Review of Systems Review of Systems  All other systems reviewed and are negative.    Physical Exam Updated Vital Signs BP (!) 162/119 (BP Location: Right Arm)   Pulse 86   Temp 97.9 F (36.6 C) (Rectal)   Resp 20   SpO2 98%   Physical Exam  Constitutional: She is oriented to person, place, and time. She appears well-developed and well-nourished.  HENT:  Head: Normocephalic and atraumatic.  Cardiovascular: Normal rate and regular rhythm.   No murmur heard. Pulmonary/Chest: Effort normal and breath sounds normal. No respiratory distress.  Abdominal: Soft. There is no tenderness. There is no rebound and no guarding.  Right CVA tenderness  Musculoskeletal: She exhibits no tenderness.  Pitting edema to bilateral lower extremities.  Neurological: She is alert and oriented to person, place, and time.  Skin: Skin is warm and dry.  Psychiatric: She has a normal mood and affect. Her behavior is normal.  Nursing note and vitals reviewed.    ED Treatments / Results  Labs (all labs ordered are listed, but only abnormal results are displayed) Labs Reviewed  COMPREHENSIVE METABOLIC PANEL - Abnormal; Notable for the following:       Result Value   Chloride 95 (*)    CO2 34 (*)    Glucose, Bld 254 (*)    Total Protein 8.7 (*)    GFR calc non Af Amer 58 (*)    All other components within normal limits  CBC WITH DIFFERENTIAL/PLATELET - Abnormal; Notable for the following:    WBC 12.5 (*)    RDW 15.6 (*)    Monocytes Absolute 2.9 (*)  All other  components within normal limits  URINALYSIS, ROUTINE W REFLEX MICROSCOPIC (NOT AT [facility]) - Abnormal; Notable for the following:    APPearance CLOUDY (*)    Glucose, UA >1000 (*)    Hgb urine dipstick TRACE (*)    Nitrite POSITIVE (*)    Leukocytes, UA SMALL (*)    All other components within normal limits  URINE MICROSCOPIC-ADD ON - Abnormal; Notable for the following:    Squamous Epithelial / LPF 0-5 (*)    Bacteria, UA MANY (*)    All other components within normal limits  URINE CULTURE  BLOOD GAS, VENOUS  AMMONIA    EKG  EKG Interpretation None       Radiology Ct Renal Stone Study  Result Date: 01/16/2016 CLINICAL DATA:  Right flank pain. White cell count 12.5. Red cells and white cells in the urine. History of chronic kidney disease, hypertension, diabetes. EXAM: CT ABDOMEN AND PELVIS WITHOUT CONTRAST TECHNIQUE: Multidetector CT imaging of the abdomen and pelvis was performed following the standard protocol without IV contrast. COMPARISON:  11/30/2014 FINDINGS: Lower chest: Small bilateral pleural effusions. Atelectasis in the lung bases. Cardiac enlargement. Hepatobiliary: No focal liver abnormality is seen. Status post cholecystectomy. No biliary dilatation. Pancreas: Unremarkable. No pancreatic ductal dilatation or surrounding inflammatory changes. Spleen: Normal in size without focal abnormality. Adrenals/Urinary Tract: Left adrenal gland nodule measures 2.7 cm maximal diameter. Density measurements are 3.2 consistent with benign fat containing adenoma. No change since previous study. Non cystic mass demonstrated in the anterior right kidney measuring 3.8 cm maximal diameter. This demonstrates enlargement since previous study. Appearance is worrisome for renal cell carcinoma. Recommend surgical consultation as has been previously suggested. Bilateral renal cysts, largest on the right measuring 7.1 cm diameter. No hydronephrosis or hydroureter. No renal, ureteral, or bladder stones.  Bladder is decompressed. Stomach/Bowel: Stomach, small bowel, and colon are not abnormally distended. Stool fills the colon. No apparent inflammatory changes. Appendix is not identified. Vascular/Lymphatic: Aortic atherosclerosis. Prominent lymph nodes in the groin regions bilaterally, likely reactive. No significant retroperitoneal lymphadenopathy. Reproductive: Uterus is not enlarged. Right ovarian cyst measuring 2.9 cm, likely physiologic. Other: No abdominal wall hernia or abnormality. No abdominopelvic ascites. Musculoskeletal: Lucent lesion demonstrated in the right iliac crest without change since previous study. Degenerative changes in the lumbar spine IMPRESSION: No renal or ureteral stone or obstruction. There is an enlarging solid mass in the right kidney worrisome for renal cell carcinoma. Benign cysts in the kidneys. Left adrenal gland nodule consistent with adenoma. Less than 3 cm diameter right ovarian cysts is likely physiologic. Indeterminate lucent lesion in the right iliac bone is similar to prior study. Electronically Signed   By: Lucienne Capers M.D.   On: 01/16/2016 00:03    Procedures Procedures (including critical care time)  Medications Ordered in ED Medications  gentamicin (GARAMYCIN) 600 mg in dextrose 5 % 100 mL IVPB (not administered)     Initial Impression / Assessment and Plan / ED Course  I have reviewed the triage vital signs and the nursing notes.  Pertinent labs & imaging results that were available during my care of the patient were reviewed by me and considered in my medical decision making (see chart for details).  Clinical Course     Patient here for evaluation of right flank pain and change in mental status. On initial evaluation patient is alert and oriented. After patient's ED arrival additional history is provided by the patient's son and power of attorney, [name]. He  states that she has been complaining of right side pain and the nursing home staff  thought she had a mass there. He is concerned that she has a urinary tract infection because over the last week she has had some changes in her behavior with more agitation and confusion intermittently and the symptoms are similar to previous UTIs. UA in the emergency department is concerning for urinary tract infection. Given her flank pain CT scan was obtained that demonstrates a renal mass that is known. Discussed with patient's son findings of studies. Reviewed prior culture information from January of this year and she does have a history of ESBL Escherichia coli. Given the change in her behavior with leukocytosis and flank pain will treat with IV antibiotics for urinary tract infection. Hospitalist consultated for admission.  Final Clinical Impressions(s) / ED Diagnoses   Final diagnoses:  Pyelonephritis    New Prescriptions New Prescriptions   No medications on file     Quintella Reichert, MD 01/16/16 [phone]

## 2016-01-15 NOTE — ED Notes (Signed)
Bed: [id number] Expected date:  Expected time:  Means of arrival:  Comments: EMS 70 yo female-UTI symptoms

## 2016-01-15 NOTE — ED Triage Notes (Signed)
Pt is presented from [facility] on [address], reportedly called EMS for herself stating that she wasn't getting any help she needed. AOx2 and her son also POA reported on phone that when pt is altered mentally she usually has UTI. Reported CBG [id number].

## 2016-01-16 ENCOUNTER — Encounter (HOSPITAL_COMMUNITY): Payer: Medicare HMO

## 2016-01-16 ENCOUNTER — Inpatient Hospital Stay (HOSPITAL_COMMUNITY): Payer: Medicare HMO

## 2016-01-16 DIAGNOSIS — E1149 Type 2 diabetes mellitus with other diabetic neurological complication: Secondary | ICD-10-CM | POA: Diagnosis not present

## 2016-01-16 DIAGNOSIS — G4733 Obstructive sleep apnea (adult) (pediatric): Secondary | ICD-10-CM

## 2016-01-16 DIAGNOSIS — F250 Schizoaffective disorder, bipolar type: Secondary | ICD-10-CM | POA: Diagnosis present

## 2016-01-16 DIAGNOSIS — N390 Urinary tract infection, site not specified: Secondary | ICD-10-CM | POA: Diagnosis present

## 2016-01-16 DIAGNOSIS — I130 Hypertensive heart and chronic kidney disease with heart failure and stage 1 through stage 4 chronic kidney disease, or unspecified chronic kidney disease: Secondary | ICD-10-CM | POA: Diagnosis present

## 2016-01-16 DIAGNOSIS — F419 Anxiety disorder, unspecified: Secondary | ICD-10-CM | POA: Diagnosis present

## 2016-01-16 DIAGNOSIS — I10 Essential (primary) hypertension: Secondary | ICD-10-CM | POA: Diagnosis not present

## 2016-01-16 DIAGNOSIS — J449 Chronic obstructive pulmonary disease, unspecified: Secondary | ICD-10-CM | POA: Diagnosis not present

## 2016-01-16 DIAGNOSIS — R4182 Altered mental status, unspecified: Secondary | ICD-10-CM | POA: Diagnosis present

## 2016-01-16 DIAGNOSIS — Z888 Allergy status to other drugs, medicaments and biological substances status: Secondary | ICD-10-CM | POA: Diagnosis not present

## 2016-01-16 DIAGNOSIS — K5900 Constipation, unspecified: Secondary | ICD-10-CM | POA: Diagnosis present

## 2016-01-16 DIAGNOSIS — B9620 Unspecified Escherichia coli [E. coli] as the cause of diseases classified elsewhere: Secondary | ICD-10-CM | POA: Diagnosis present

## 2016-01-16 DIAGNOSIS — M7989 Other specified soft tissue disorders: Secondary | ICD-10-CM | POA: Diagnosis not present

## 2016-01-16 DIAGNOSIS — Z1612 Extended spectrum beta lactamase (ESBL) resistance: Secondary | ICD-10-CM | POA: Diagnosis present

## 2016-01-16 DIAGNOSIS — N2889 Other specified disorders of kidney and ureter: Secondary | ICD-10-CM

## 2016-01-16 DIAGNOSIS — Z794 Long term (current) use of insulin: Secondary | ICD-10-CM

## 2016-01-16 DIAGNOSIS — Z9981 Dependence on supplemental oxygen: Secondary | ICD-10-CM | POA: Diagnosis not present

## 2016-01-16 DIAGNOSIS — E1122 Type 2 diabetes mellitus with diabetic chronic kidney disease: Secondary | ICD-10-CM | POA: Diagnosis present

## 2016-01-16 DIAGNOSIS — I5032 Chronic diastolic (congestive) heart failure: Secondary | ICD-10-CM

## 2016-01-16 DIAGNOSIS — Z6841 Body Mass Index (BMI) 40.0 and over, adult: Secondary | ICD-10-CM | POA: Diagnosis not present

## 2016-01-16 DIAGNOSIS — I2510 Atherosclerotic heart disease of native coronary artery without angina pectoris: Secondary | ICD-10-CM | POA: Diagnosis present

## 2016-01-16 DIAGNOSIS — N189 Chronic kidney disease, unspecified: Secondary | ICD-10-CM | POA: Diagnosis present

## 2016-01-16 DIAGNOSIS — J9602 Acute respiratory failure with hypercapnia: Secondary | ICD-10-CM

## 2016-01-16 DIAGNOSIS — G9341 Metabolic encephalopathy: Secondary | ICD-10-CM

## 2016-01-16 DIAGNOSIS — J9622 Acute and chronic respiratory failure with hypercapnia: Secondary | ICD-10-CM | POA: Diagnosis not present

## 2016-01-16 DIAGNOSIS — R0602 Shortness of breath: Secondary | ICD-10-CM | POA: Diagnosis not present

## 2016-01-16 DIAGNOSIS — N3000 Acute cystitis without hematuria: Secondary | ICD-10-CM

## 2016-01-16 DIAGNOSIS — Z66 Do not resuscitate: Secondary | ICD-10-CM | POA: Diagnosis present

## 2016-01-16 DIAGNOSIS — Z9119 Patient's noncompliance with other medical treatment and regimen: Secondary | ICD-10-CM | POA: Diagnosis not present

## 2016-01-16 DIAGNOSIS — J9601 Acute respiratory failure with hypoxia: Secondary | ICD-10-CM

## 2016-01-16 DIAGNOSIS — I959 Hypotension, unspecified: Secondary | ICD-10-CM | POA: Diagnosis present

## 2016-01-16 DIAGNOSIS — M1990 Unspecified osteoarthritis, unspecified site: Secondary | ICD-10-CM | POA: Diagnosis present

## 2016-01-16 DIAGNOSIS — J9621 Acute and chronic respiratory failure with hypoxia: Secondary | ICD-10-CM | POA: Diagnosis not present

## 2016-01-16 LAB — GLUCOSE, CAPILLARY
GLUCOSE-CAPILLARY: 124 mg/dL — AB (ref 65–99)
GLUCOSE-CAPILLARY: 201 mg/dL — AB (ref 65–99)
Glucose-Capillary: 191 mg/dL — ABNORMAL HIGH (ref 65–99)
Glucose-Capillary: 176 mg/dL — ABNORMAL HIGH (ref 65–99)
Glucose-Capillary: 159 mg/dL — ABNORMAL HIGH (ref 65–99)

## 2016-01-16 LAB — BLOOD GAS, VENOUS
ACID-BASE EXCESS: 8.6 mmol/L — AB (ref 0.0–2.0)
Bicarbonate: 38.1 mmol/L — ABNORMAL HIGH (ref 20.0–28.0)
FIO2: 21
O2 SAT: 31.9 %
PATIENT TEMPERATURE: 99.4
PCO2 VEN: 80.7 mmHg — AB (ref 44.0–60.0)
pH, Ven: 7.298 (ref 7.250–7.430)

## 2016-01-16 LAB — BLOOD GAS, ARTERIAL
ACID-BASE EXCESS: 10.3 mmol/L — AB (ref 0.0–2.0)
Bicarbonate: 36.3 mmol/L — ABNORMAL HIGH (ref 20.0–28.0)
Delivery systems: POSITIVE
Drawn by: 308601
EXPIRATORY PAP: 10
FIO2: 24
INSPIRATORY PAP: 20
MODE: POSITIVE
O2 SAT: 92.8 %
PATIENT TEMPERATURE: 37
PCO2 ART: 56.5 mmHg — AB (ref 32.0–48.0)
PO2 ART: 65.5 mmHg — AB (ref 83.0–108.0)
pH, Arterial: 7.424 (ref 7.350–7.450)

## 2016-01-16 LAB — BASIC METABOLIC PANEL
Anion gap: 7 (ref 5–15)
BUN: 16 mg/dL (ref 6–20)
CHLORIDE: 98 mmol/L — AB (ref 101–111)
CO2: 35 mmol/L — ABNORMAL HIGH (ref 22–32)
Calcium: 8.6 mg/dL — ABNORMAL LOW (ref 8.9–10.3)
Creatinine, Ser: 0.88 mg/dL (ref 0.44–1.00)
GFR calc Af Amer: >60 mL/min (ref >60)
GFR calc non Af Amer: >60 mL/min (ref >60)
Glucose, Bld: 183 mg/dL — ABNORMAL HIGH (ref 65–99)
POTASSIUM: 3.5 mmol/L (ref 3.5–5.1)
SODIUM: 140 mmol/L (ref 135–145)

## 2016-01-16 LAB — AMMONIA: AMMONIA: 42 umol/L — AB (ref 9–35)

## 2016-01-16 LAB — CBC
HCT: 36.6 % (ref 36.0–46.0)
HEMOGLOBIN: 11.3 g/dL — AB (ref 12.0–15.0)
MCH: 28.2 pg (ref 26.0–34.0)
MCHC: 30.9 g/dL (ref 30.0–36.0)
MCV: 91.3 fL (ref 78.0–100.0)
Platelets: 219 10*3/uL (ref 150–400)
RBC: 4.01 MIL/uL (ref 3.87–5.11)
RDW: 15.8 % — ABNORMAL HIGH (ref 11.5–15.5)
WBC: 12.7 10*3/uL — ABNORMAL HIGH (ref 4.0–10.5)

## 2016-01-16 LAB — MRSA PCR SCREENING: MRSA BY PCR: NEGATIVE

## 2016-01-16 LAB — BRAIN NATRIURETIC PEPTIDE: B NATRIURETIC PEPTIDE 5: 53.8 pg/mL (ref 0.0–100.0)

## 2016-01-16 MED ORDER — CARVEDILOL 12.5 MG PO TABS
12.5000 mg | ORAL_TABLET | Freq: Two times a day (BID) | ORAL | Status: DC
Start: 2016-01-16 — End: 2016-01-17
  Administered 2016-01-16 – 2016-01-17 (×3): 12.5 mg via ORAL
  Filled 2016-01-16 (×3): qty 1

## 2016-01-16 MED ORDER — ONDANSETRON HCL 4 MG/2ML IJ SOLN: 4.0000 mg | Freq: Four times a day (QID) | INTRAMUSCULAR | Status: DC | PRN

## 2016-01-16 MED ORDER — BENZTROPINE MESYLATE 0.5 MG PO TABS
0.5000 mg | ORAL_TABLET | Freq: Two times a day (BID) | ORAL | Status: DC
  Administered 2016-01-16 – 2016-01-21 (×11): 0.5 mg via ORAL
  Filled 2016-01-16 (×11): qty 1

## 2016-01-16 MED ORDER — LORAZEPAM 0.5 MG PO TABS
0.5000 mg | ORAL_TABLET | Freq: Three times a day (TID) | ORAL | Status: DC | PRN
Start: 2016-01-16 — End: 2016-01-21
  Administered 2016-01-16 – 2016-01-18 (×3): 0.5 mg via ORAL
  Filled 2016-01-16 (×3): qty 1

## 2016-01-16 MED ORDER — ACETAMINOPHEN 650 MG RE SUPP: 650.0000 mg | Freq: Four times a day (QID) | RECTAL | Status: DC | PRN

## 2016-01-16 MED ORDER — SENNOSIDES-DOCUSATE SODIUM 8.6-50 MG PO TABS
1.0000 | ORAL_TABLET | Freq: Two times a day (BID) | ORAL | Status: DC
  Administered 2016-01-16 – 2016-01-21 (×10): 1 via ORAL
  Filled 2016-01-16 (×10): qty 1

## 2016-01-16 MED ORDER — POLYETHYLENE GLYCOL 3350 17 G PO PACK
17.0000 g | PACK | Freq: Every day | ORAL | Status: DC
  Administered 2016-01-16 – 2016-01-21 (×6): 17 g via ORAL
  Filled 2016-01-16 (×6): qty 1

## 2016-01-16 MED ORDER — DIVALPROEX SODIUM 250 MG PO DR TAB
1000.0000 mg | DELAYED_RELEASE_TABLET | Freq: Every day | ORAL | Status: DC
  Administered 2016-01-16 – 2016-01-20 (×5): 1000 mg via ORAL
  Filled 2016-01-16 (×5): qty 4

## 2016-01-16 MED ORDER — ACETAMINOPHEN 325 MG PO TABS
650.0000 mg | ORAL_TABLET | Freq: Four times a day (QID) | ORAL | Status: DC | PRN
  Administered 2016-01-17: 650 mg via ORAL
  Filled 2016-01-16: qty 2

## 2016-01-16 MED ORDER — ENOXAPARIN SODIUM 40 MG/0.4ML ~~LOC~~ SOLN: 40.0000 mg | SUBCUTANEOUS | Status: DC

## 2016-01-16 MED ORDER — SODIUM CHLORIDE 0.9% FLUSH
3.0000 mL | Freq: Two times a day (BID) | INTRAVENOUS | Status: DC
  Administered 2016-01-16 – 2016-01-21 (×10): 3 mL via INTRAVENOUS

## 2016-01-16 MED ORDER — ONDANSETRON HCL 4 MG PO TABS: 4.0000 mg | ORAL_TABLET | Freq: Four times a day (QID) | ORAL | Status: DC | PRN

## 2016-01-16 MED ORDER — DEXTROSE 5 % IV SOLN
7.0000 mg/kg | INTRAVENOUS | Status: DC
  Filled 2016-01-16: qty 15

## 2016-01-16 MED ORDER — ENOXAPARIN SODIUM 60 MG/0.6ML ~~LOC~~ SOLN
60.0000 mg | SUBCUTANEOUS | Status: DC
  Administered 2016-01-16 – 2016-01-21 (×6): 60 mg via SUBCUTANEOUS
  Filled 2016-01-16 (×6): qty 0.6

## 2016-01-16 MED ORDER — POTASSIUM CHLORIDE CRYS ER 20 MEQ PO TBCR
40.0000 meq | EXTENDED_RELEASE_TABLET | Freq: Once | ORAL | Status: AC
  Administered 2016-01-16: 40 meq via ORAL
  Filled 2016-01-16: qty 2

## 2016-01-16 MED ORDER — MOMETASONE FURO-FORMOTEROL FUM 200-5 MCG/ACT IN AERO
2.0000 | INHALATION_SPRAY | Freq: Two times a day (BID) | RESPIRATORY_TRACT | Status: DC
  Administered 2016-01-16 – 2016-01-21 (×8): 2 via RESPIRATORY_TRACT
  Filled 2016-01-16: qty 8.8

## 2016-01-16 MED ORDER — SODIUM CHLORIDE 0.9 % IV SOLN
500.0000 mg | Freq: Once | INTRAVENOUS | Status: AC
  Administered 2016-01-16: 500 mg via INTRAVENOUS
  Filled 2016-01-16: qty 500

## 2016-01-16 MED ORDER — INSULIN GLARGINE 100 UNIT/ML ~~LOC~~ SOLN
10.0000 [IU] | Freq: Every day | SUBCUTANEOUS | Status: DC
  Administered 2016-01-16 – 2016-01-20 (×5): 10 [IU] via SUBCUTANEOUS
  Filled 2016-01-16 (×6): qty 0.1

## 2016-01-16 MED ORDER — INSULIN ASPART 100 UNIT/ML ~~LOC~~ SOLN
0.0000 [IU] | SUBCUTANEOUS | Status: DC
  Administered 2016-01-16: 3 [IU] via SUBCUTANEOUS
  Administered 2016-01-16: 2 [IU] via SUBCUTANEOUS
  Administered 2016-01-16 (×2): 3 [IU] via SUBCUTANEOUS
  Administered 2016-01-16: 5 [IU] via SUBCUTANEOUS
  Administered 2016-01-17: 3 [IU] via SUBCUTANEOUS
  Administered 2016-01-17: 2 [IU] via SUBCUTANEOUS
  Administered 2016-01-17: 3 [IU] via SUBCUTANEOUS
  Administered 2016-01-17: 2 [IU] via SUBCUTANEOUS
  Administered 2016-01-17: 3 [IU] via SUBCUTANEOUS
  Administered 2016-01-18: 2 [IU] via SUBCUTANEOUS
  Administered 2016-01-18: 3 [IU] via SUBCUTANEOUS
  Administered 2016-01-18: 5 [IU] via SUBCUTANEOUS
  Administered 2016-01-18: 3 [IU] via SUBCUTANEOUS
  Administered 2016-01-18: 2 [IU] via SUBCUTANEOUS
  Administered 2016-01-19: 3 [IU] via SUBCUTANEOUS
  Administered 2016-01-19: 2 [IU] via SUBCUTANEOUS
  Administered 2016-01-19: 3 [IU] via SUBCUTANEOUS
  Administered 2016-01-19 – 2016-01-20 (×4): 2 [IU] via SUBCUTANEOUS

## 2016-01-16 MED ORDER — FLEET ENEMA 7-19 GM/118ML RE ENEM
1.0000 | ENEMA | Freq: Once | RECTAL | Status: AC
  Administered 2016-01-16: 1 via RECTAL
  Filled 2016-01-16: qty 1

## 2016-01-16 MED ORDER — ATORVASTATIN CALCIUM 20 MG PO TABS
20.0000 mg | ORAL_TABLET | Freq: Every day | ORAL | Status: DC
  Administered 2016-01-16 – 2016-01-20 (×5): 20 mg via ORAL
  Filled 2016-01-16 (×4): qty 2
  Filled 2016-01-16: qty 1

## 2016-01-16 MED ORDER — TRAMADOL HCL 50 MG PO TABS
50.0000 mg | ORAL_TABLET | Freq: Four times a day (QID) | ORAL | Status: DC | PRN
  Administered 2016-01-16 – 2016-01-20 (×6): 50 mg via ORAL
  Filled 2016-01-16 (×6): qty 1

## 2016-01-16 MED ORDER — POTASSIUM CHLORIDE IN NACL 20-0.9 MEQ/L-% IV SOLN
INTRAVENOUS | Status: AC
  Administered 2016-01-16 – 2016-01-17 (×2): via INTRAVENOUS
  Filled 2016-01-16 (×2): qty 1000

## 2016-01-16 MED ORDER — IPRATROPIUM-ALBUTEROL 0.5-2.5 (3) MG/3ML IN SOLN: 3.0000 mL | RESPIRATORY_TRACT | Status: DC | PRN

## 2016-01-16 MED ORDER — CHLORHEXIDINE GLUCONATE 0.12 % MT SOLN
15.0000 mL | Freq: Two times a day (BID) | OROMUCOSAL | Status: DC
  Administered 2016-01-16 – 2016-01-21 (×11): 15 mL via OROMUCOSAL
  Filled 2016-01-16 (×11): qty 15

## 2016-01-16 MED ORDER — SODIUM CHLORIDE 0.9 % IV SOLN
500.0000 mg | Freq: Four times a day (QID) | INTRAVENOUS | Status: DC
  Administered 2016-01-16 – 2016-01-20 (×17): 500 mg via INTRAVENOUS
  Filled 2016-01-16 (×20): qty 500

## 2016-01-16 MED ORDER — FAMOTIDINE 20 MG PO TABS
20.0000 mg | ORAL_TABLET | Freq: Two times a day (BID) | ORAL | Status: DC
  Administered 2016-01-16 – 2016-01-21 (×11): 20 mg via ORAL
  Filled 2016-01-16 (×11): qty 1

## 2016-01-16 MED ORDER — ORAL CARE MOUTH RINSE
15.0000 mL | Freq: Two times a day (BID) | OROMUCOSAL | Status: DC
  Administered 2016-01-16 – 2016-01-20 (×7): 15 mL via OROMUCOSAL

## 2016-01-16 MED ORDER — FLUPHENAZINE HCL 10 MG PO TABS
10.0000 mg | ORAL_TABLET | Freq: Every day | ORAL | Status: DC
Start: 2016-01-16 — End: 2016-01-21
  Administered 2016-01-16 – 2016-01-21 (×6): 10 mg via ORAL
  Filled 2016-01-16 (×6): qty 1

## 2016-01-16 MED ORDER — IPRATROPIUM-ALBUTEROL 0.5-2.5 (3) MG/3ML IN SOLN
3.0000 mL | RESPIRATORY_TRACT | Status: DC
  Administered 2016-01-16: 3 mL via RESPIRATORY_TRACT
  Filled 2016-01-16: qty 3

## 2016-01-16 MED ORDER — VERAPAMIL HCL 120 MG PO TABS
120.0000 mg | ORAL_TABLET | Freq: Two times a day (BID) | ORAL | Status: DC
  Administered 2016-01-16: 120 mg via ORAL
  Filled 2016-01-16: qty 1

## 2016-01-16 MED ORDER — IPRATROPIUM-ALBUTEROL 0.5-2.5 (3) MG/3ML IN SOLN
3.0000 mL | Freq: Four times a day (QID) | RESPIRATORY_TRACT | Status: DC
  Administered 2016-01-16 – 2016-01-17 (×8): 3 mL via RESPIRATORY_TRACT
  Filled 2016-01-16 (×7): qty 3

## 2016-01-16 NOTE — Progress Notes (Signed)
PROGRESS NOTE  [name] [name] [id number] DOB: May 10, 1945 DOA: 01/15/2016 PCP: Alvester Chou, NP  Brief Summary:   Patient is a SNF resident, she called EMS due to c/o right sided flank pain and feeling sob, she was found with confusion, venous blood gas co2 of 80. She is admitted to stepdown on bipap  Per SNF, patient is not compliant with her home o2 and nightly bipap Patient report baseline wheelchair bound, but able to stand up with a walker  HPI/Recap of past 24 hours:  Feeling better, off bipap on 2liter oxygen She denies sob, report right sided flank pain is better, denies abdominal pain, no fever, no n/v, report she has noticed odor when she urinate recently   Assessment/Plan: Principal Problem:   Acute respiratory failure ([place]) Active Problems:   Hypertension   Diabetes mellitus ([place])   Renal mass   Obesity   Chronic diastolic CHF (congestive heart failure) (HCC)   OSA (obstructive sleep apnea)   COPD (chronic obstructive pulmonary disease) (HCC)   Schizoaffective disorder, bipolar type (H[facility])   UTI (urinary tract infection)   Altered mental status  Acute on chronic hypercarbic/hypoxic  respiratory failure, history of COPD, suppose to be on home 02 2liter and nightly bipap, but  history of medical noncompliance with home o2 or BiPAP.  Of note, palliative care consult was actually recommended at her last discharge from [facility] in August. --she is admitted to stepdown, better, off bipap at day time, due to the need of nightly bipap, will keep in stepdown - Mental status waxing and waning. ---Scheduled duonebs.  Hold spiriva.  Advair formulary substitute --Incentive spirometer q1h while awake --no significant wheezing,.  Avoid steroids for now due to prednisone allergy.  Respiratory therapy. --start diet as tolerated  UTI (and hypercarbia) causing acute metabolic encephalopathy --IV imipenem per pharmacy now for history of ESBL E coli UTI --Urine culture  pending  History of CHF, normal BNP.  Mucous membranes are dry. Does has trace lower extremity edema --currently on gentle hydration, Hold lasix for now. -Close monitor volume status  Lower extremity edema: will get venous doppler to r/o DVT  Right sided flank pain, On exam , no tender appreciated, patient is not able to pin point location CT renal stone on admission: No renal or ureteral stone or obstruction. There is an enlarging solid mass in the right kidney worrisome for renal cell carcinoma. Benign cysts in the kidneys. Left adrenal gland nodule consistent with adenoma. Less than 3 cm diameter right ovarian cysts is likely physiologic. Indeterminate lucent lesion in the right iliac bone is similar to prior study.  HTN --bp low normal , will continue Continue Coreg,  -d/c Verapamil  Renal mass --Known, previously biopsied.  No intervention.  DM --Continue home dose of lantus.  SSI coverage.  H/o schizophernia , stable on home meds fluphenazine, depakote, cogentin  Morbid obesity: Body mass index is 46.22 kg/m.  FTT, baseline wheelchair bound, report able to use walker to stand up  DVT prophylaxis: Lovenox Code Status: DNR Family Communication:  ED attending spoke to son prior to admission; he did not answer the phone on subsequent attempt to contact him. Disposition Plan: Expect she will go back to SNF in 1-2 days Consults called: NONE   Procedures:  nightly bipap  Antibiotics:  imipenem   Objective: BP (!) 145/67   Pulse 75   Temp 98.6 F (37 C) (Axillary)   Resp 14   Ht 5\' 5"  (1.651 m)  Wt 126 kg (277 lb 12.5 oz)   SpO2 95%   BMI 46.22 kg/m   Intake/Output Summary (Last 24 hours) at 01/16/16 1357 Last data filed at 01/16/16 0800  Gross per 24 hour  Intake              100 ml  Output                0 ml  Net              100 ml   Filed Weights   01/16/16 0346  Weight: 126 kg (277 lb 12.5 oz)    Exam:   General:  NAD, slight  confusion, slow in answering questions, able to correct self and states the right time/place.  Cardiovascular: RRR  Respiratory: diminished, no wheezing  Abdomen: Soft/ND/NT, positive BS  Musculoskeletal: trace pitting Edema bilateral lower extremity  Neuro: slight confusion, slow in answering questions, able to correct self and states the right time/place.  Data Reviewed: Basic Metabolic Panel:  Recent Labs Lab 01/13/16 2346 01/15/16 2223 01/16/16 0526  NA 138 139 140  K 4.1 3.6 3.5  CL 96* 95* 98*  CO2 34* 34* 35*  GLUCOSE 169* 254* 183*  BUN 16 17 16   CREATININE 0.95 0.96 0.88  CALCIUM 9.2 9.0 8.6*   Liver Function Tests:  Recent Labs Lab 01/15/16 2223  AST 17  ALT 14  ALKPHOS 71  BILITOT 0.8  PROT 8.7*  ALBUMIN 3.7   No results for input(s): LIPASE, AMYLASE in the last 168 hours.  Recent Labs Lab 01/16/16 0102  AMMONIA 42*   CBC:  Recent Labs Lab 01/13/16 2346 01/15/16 2223 01/16/16 0526  WBC 11.6* 12.5* 12.7*  NEUTROABS 6.1 6.0  --   HGB 13.7 13.8 11.3*  HCT 42.6 43.5 36.6  MCV 89.9 91.0 91.3  PLT 238 230 219   Cardiac Enzymes:   No results for input(s): CKTOTAL, CKMB, CKMBINDEX, TROPONINI in the last 168 hours. BNP (last 3 results)  Recent Labs  10/12/15 2032 01/13/16 2346 01/15/16 2223  BNP 37.0 53.7 53.8    ProBNP (last 3 results) No results for input(s): PROBNP in the last 8760 hours.  CBG:  Recent Labs Lab 01/16/16 0531 01/16/16 0752 01/16/16 1204  GLUCAP 191* 176* 124*    Recent Results (from the past 240 hour(s))  MRSA PCR Screening     Status: None   Collection Time: 01/16/16  3:53 AM  Result Value Ref Range Status   MRSA by PCR NEGATIVE NEGATIVE Final    Comment:        The GeneXpert MRSA Assay (FDA approved for NASAL specimens only), is one component of a comprehensive MRSA colonization surveillance program. It is not intended to diagnose MRSA infection nor to guide or monitor treatment for MRSA  infections.      Studies: Dg Chest Port 1 View  Result Date: 01/16/2016 CLINICAL DATA:  Shortness of breath.  Altered mental status. EXAM: PORTABLE CHEST 1 VIEW COMPARISON:  01/13/2016 FINDINGS: Shallow inspiration. Linear atelectasis in the left mid lung. Cardiac enlargement. No focal consolidation or edema. No blunting of costophrenic angles. No pneumothorax. Tortuous aorta. IMPRESSION: Shallow inspiration with linear atelectasis in the left mid lung. Cardiac enlargement. Electronically Signed   By: Lucienne Capers M.D.   On: 01/16/2016 02:04   Ct Renal Stone Study  Result Date: 01/16/2016 CLINICAL DATA:  Right flank pain. White cell count 12.5. Red cells and white cells in the urine. History of  chronic kidney disease, hypertension, diabetes. EXAM: CT ABDOMEN AND PELVIS WITHOUT CONTRAST TECHNIQUE: Multidetector CT imaging of the abdomen and pelvis was performed following the standard protocol without IV contrast. COMPARISON:  11/30/2014 FINDINGS: Lower chest: Small bilateral pleural effusions. Atelectasis in the lung bases. Cardiac enlargement. Hepatobiliary: No focal liver abnormality is seen. Status post cholecystectomy. No biliary dilatation. Pancreas: Unremarkable. No pancreatic ductal dilatation or surrounding inflammatory changes. Spleen: Normal in size without focal abnormality. Adrenals/Urinary Tract: Left adrenal gland nodule measures 2.7 cm maximal diameter. Density measurements are 3.2 consistent with benign fat containing adenoma. No change since previous study. Non cystic mass demonstrated in the anterior right kidney measuring 3.8 cm maximal diameter. This demonstrates enlargement since previous study. Appearance is worrisome for renal cell carcinoma. Recommend surgical consultation as has been previously suggested. Bilateral renal cysts, largest on the right measuring 7.1 cm diameter. No hydronephrosis or hydroureter. No renal, ureteral, or bladder stones. Bladder is decompressed.  Stomach/Bowel: Stomach, small bowel, and colon are not abnormally distended. Stool fills the colon. No apparent inflammatory changes. Appendix is not identified. Vascular/Lymphatic: Aortic atherosclerosis. Prominent lymph nodes in the groin regions bilaterally, likely reactive. No significant retroperitoneal lymphadenopathy. Reproductive: Uterus is not enlarged. Right ovarian cyst measuring 2.9 cm, likely physiologic. Other: No abdominal wall hernia or abnormality. No abdominopelvic ascites. Musculoskeletal: Lucent lesion demonstrated in the right iliac crest without change since previous study. Degenerative changes in the lumbar spine IMPRESSION: No renal or ureteral stone or obstruction. There is an enlarging solid mass in the right kidney worrisome for renal cell carcinoma. Benign cysts in the kidneys. Left adrenal gland nodule consistent with adenoma. Less than 3 cm diameter right ovarian cysts is likely physiologic. Indeterminate lucent lesion in the right iliac bone is similar to prior study. Electronically Signed   By: Lucienne Capers M.D.   On: 01/16/2016 00:03    Scheduled Meds: . atorvastatin  20 mg Oral QHS  . benztropine  0.5 mg Oral BID  . carvedilol  12.5 mg Oral BID  . chlorhexidine  15 mL Mouth Rinse BID  . divalproex  1,000 mg Oral QHS  . enoxaparin (LOVENOX) injection  60 mg Subcutaneous Q24H  . famotidine  20 mg Oral BID  . fluPHENAZine  10 mg Oral Daily  . imipenem-cilastatin  500 mg Intravenous Q6H  . insulin aspart  0-15 Units Subcutaneous Q4H  . insulin glargine  10 Units Subcutaneous QHS  . ipratropium-albuterol  3 mL Nebulization QID  . mouth rinse  15 mL Mouth Rinse q12n4p  . mometasone-formoterol  2 puff Inhalation BID  . polyethylene glycol  17 g Oral Daily  . potassium chloride  40 mEq Oral Once  . senna-docusate  1 tablet Oral BID  . sodium chloride flush  3 mL Intravenous Q12H    Continuous Infusions: . 0.9 % NaCl with KCl 20 mEq / L 75 mL/hr at 01/16/16 1017       Time spent: 25 mins from 2:30 to 2:45pm  Zyair Rhein MD, PhD  [facility] Pager [phone]. If 7PM-7AM, please contact night-coverage at [email or web address], password [facility] [place] 01/16/2016, 1:57 PM  LOS: 0 days

## 2016-01-16 NOTE — Care Management Note (Signed)
Case Management Note  Patient Details  Name: PLACIDA LUSCH MRN: [id number] Date of Birth: 09/11/1945  Subjective/Objective:     Copd requiring bi-pap               Action/Plan:  From snf-[place] [facility] Date:  January 16, 2016 Chart reviewed for concurrent status and case management needs. Will continue to follow patient progress. Discharge Planning: following for needs Expected discharge date: [id number] Velva Harman, BSN, [place], [place]  Expected Discharge Date:                  Expected Discharge Plan:  [place]  In-House Referral:  Clinical Social Work  Discharge planning Services     Post Acute Care Choice:    Choice offered to:     DME Arranged:    DME Agency:     HH Arranged:    [place] Agency:     Status of Service:  In process, will continue to follow  If discussed at Long Length of Stay Meetings, dates discussed:    Additional Comments:  Leeroy Cha, RN 01/16/2016, 10:05 AM

## 2016-01-16 NOTE — Progress Notes (Signed)
Pt. has not had a bowel movement since admission. Has been straining a lot. Requested enema. MD notified. Fleet enema given. Pt. passed large amounts of hard stools with a scant amount of blood smear. Relief from symptoms noted.

## 2016-01-16 NOTE — H&P (Signed)
History and Physical    [name] [name] [id number] DOB: 1945-06-04 DOA: 01/15/2016  PCP: Alvester Chou, NP   Patient coming from: [place]  Chief Complaint: Altered mental status, right flank pain  HPI: [name] [name] is a 70 y.o. woman with a history of COPD on home oxygen (reportedly 2L [place]), obesity hypoventilation syndrome, CO2 retention (she is noncompliant with BiPAP at home), diastolic heart failure, HTN, DM, and schizophrenia who actually called 911 herself from her SNF because she was not feeling well.  History is limited due to waxing and waning mental status, but she becomes emotional when talking about the way she is treated at her SNF.  She has had right flank pain and intermittent shortness of breath for the past few days.  She denies chest pain to me.  She denies light-headedness or dizziness.  She knows that she is in the hospital but she cannot tell me the month or year.  ED attending spoke to her son by phone, who says that she typically has a UTI when she acts like this.  ED Course: U/A is nitrite positive with many bacteria, 6-30 WBCs.  She has a history of ESBL E coli UTI.  She also has a VBG that shows CO2 retention.  IV gentamicin was discontinued in favor of imipenem for empiric coverage of her UTI.  She has been placed on BiPAP for per protocol for her CO2 retention.  Hospitalist asked to admit.    Of note, she has a DNR status.  Review of Systems: As per HPI otherwise 10 point review of systems negative.    Past Medical History:  Diagnosis Date  . Anxiety   . Arthritis   . Bronchitis   . Bursitis   . CAD (coronary artery disease)   . CHF (congestive heart failure) ([place])   . Chronic cough   . Chronic kidney disease    shadow on x-ray  . COPD (chronic obstructive pulmonary disease) ([name])   . Depression   . DM2 (diabetes mellitus, type 2) ([place])   . Environmental allergies   . GERD (gastroesophageal reflux disease)   . Hypercholesteremia   .  Hypertension   . Left ventricular outflow tract obstruction    a. echo 03/2014: EF 60-65%, hypernamic LV systolic fxn, mod LVH w/ LVOT gradient estimated at 68 mm Hg w/ valsalva, very small LV internal cavity size, mildly increased LV posterior wall thickness, mild Ao valve scl w/o stenosis, diastolic dysfunction, normal RVSP  . Lower extremity edema   . LVH (left ventricular hypertrophy)    a. echo suggests long standing uncontrolled htn. she will not do well when dehydrated, LV cavity obliteration  . Muscle weakness   . Obesity   . On supplemental oxygen therapy    AS NEEDED  . OSA (obstructive sleep apnea)    does not use machine  . Osteoarthritis   . Schizophrenia ([place])   . Tremors of nervous system   . Wheezing     Past Surgical History:  Procedure Laterality Date  . CATARACT EXTRACTION W/PHACO Left 09/10/2014   Procedure: CATARACT EXTRACTION PHACO AND INTRAOCULAR LENS PLACEMENT (IOC);  Surgeon: Birder Robson, MD;  Location: [facility] ORS;  Service: Ophthalmology;  Laterality: Left;  [place]: 00:44 AP%: 22.8 CDE: 10.24 Fluid lot [id number]  . CATARACT EXTRACTION W/PHACO Right 10/15/2014   Procedure: CATARACT EXTRACTION PHACO AND INTRAOCULAR LENS PLACEMENT (IOC);  Surgeon: Birder Robson, MD;  Location: [facility] ORS;  Service: Ophthalmology;  Laterality: Right;  [place]:  00:52 AP:40.1 CDE:11.83 LOT PACK [id number]  . CHOLECYSTECTOMY    . COLONOSCOPY  04/2011   [facility] per patient incomplete  . EYE SURGERY    . JOINT REPLACEMENT     TKR  . KNEE RECONSTRUCTION, MEDIAL PATELLAR FEMORAL LIGAMENT    . RENAL BIOPSY    . TONSILLECTOMY    . TONSILLECTOMY       reports that she has quit smoking. She has a 120.00 pack-year smoking history. She has never used smokeless tobacco. She reports that she does not drink alcohol or use drugs.  Allergies  Allergen Reactions  . Haldol [Haloperidol Decanoate] Swelling and Other (See Comments)    Reaction:  Swelling of tongue and blurred vision    . Metformin  Diarrhea  . Prednisone Other (See Comments)    Unknown reaction  . Raspberry Swelling and Other (See Comments)    Reaction:  Swelling of lips     Family History  Problem Relation Age of Onset  . Heart attack Mother   . Colon cancer Neg Hx   . Liver disease Neg Hx      Prior to Admission medications   Medication Sig Start Date End Date Taking? Authorizing Provider  acetaminophen (TYLENOL) 325 MG tablet Take 2 tablets (650 mg total) by mouth every 4 (four) hours as needed for mild pain (temp > 101.5). 10/20/15  Yes Gladstone Lighter, MD  albuterol (ACCUNEB) 1.25 MG/3ML nebulizer solution Take 3 mLs by nebulization 3 (three) times daily as needed for wheezing or shortness of breath.   Yes Historical Provider, MD  Amino Acids-Protein Hydrolys (FEEDING SUPPLEMENT, PRO-STAT SUGAR FREE 64,) LIQD Take 30 mLs by mouth 2 (two) times daily.   Yes Historical Provider, MD  atorvastatin (LIPITOR) 20 MG tablet Take 20 mg by mouth at bedtime.   Yes Historical Provider, MD  benztropine (COGENTIN) 0.5 MG tablet Take 0.5 mg by mouth 2 (two) times daily. *   Yes Historical Provider, MD  benztropine (COGENTIN) 1 MG tablet Take 1 mg by mouth daily as needed for tremors. For muscle stiffness/contracture. *Note dose*   Yes Historical Provider, MD  carvedilol (COREG) 12.5 MG tablet Take 1 tablet (12.5 mg total) by mouth 2 (two) times daily. 10/20/15  Yes Gladstone Lighter, MD  dicyclomine (BENTYL) 20 MG tablet Take 1 tablet (20 mg total) by mouth 3 (three) times daily as needed for spasms. 11/30/14 01/15/16 Yes Orbie Pyo, MD  divalproex (DEPAKOTE) 500 MG DR tablet Take 1 tablet (500 mg total) by mouth 2 (two) times daily. Patient taking differently: Take 1,000 mg by mouth at bedtime.  10/20/15  Yes Gladstone Lighter, MD  fluPHENAZine (PROLIXIN) 10 MG tablet Take 10 mg by mouth daily.   Yes Historical Provider, MD  fluPHENAZine decanoate (PROLIXIN) 25 MG/ML injection Inject 1 mL (25 mg total) into the  muscle every 14 (fourteen) days. 11/14/14  Yes Hildred Priest, MD  Fluticasone-Salmeterol (ADVAIR) 250-50 MCG/DOSE AEPB Inhale 1 puff into the lungs 2 (two) times daily.   Yes Historical Provider, MD  furosemide (LASIX) 40 MG tablet Take 40 mg by mouth 2 (two) times daily.   Yes Historical Provider, MD  guaiFENesin (ROBITUSSIN) 100 MG/5ML SOLN Take 15 mLs by mouth every 6 (six) hours as needed for cough or to loosen phlegm. *Notify physician if cough persists more than 3 days*   Yes Historical Provider, MD  insulin aspart (NOVOLOG) 100 UNIT/ML injection Inject 2-10 Units into the skin 4 (four) times daily -  before meals  and at bedtime. Sliding scale   Yes Historical Provider, MD  insulin glargine (LANTUS) 100 UNIT/ML injection Inject 10 Units into the skin at bedtime.   Yes Historical Provider, MD  LORazepam (ATIVAN) 0.5 MG tablet Take 1 tablet (0.5 mg total) by mouth every 8 (eight) hours as needed for anxiety. Pt is also able to take an additional tablet daily if needed for anxiety. 10/20/15  Yes Gladstone Lighter, MD  magnesium hydroxide (MILK OF MAGNESIA) 400 MG/5ML suspension Take 30 mLs by mouth daily as needed for mild constipation.   Yes Historical Provider, MD  metoCLOPramide (REGLAN) 10 MG tablet Take 1 tablet (10 mg total) by mouth every 6 (six) hours as needed for nausea or vomiting. 11/30/14  Yes Orbie Pyo, MD  Multiple Vitamin (MULTIVITAMIN WITH MINERALS) TABS tablet Take 1 tablet by mouth daily.   Yes Historical Provider, MD  ranitidine (ZANTAC) 150 MG tablet Take 150 mg by mouth 2 (two) times daily.   Yes Historical Provider, MD  tiotropium (SPIRIVA) 18 MCG inhalation capsule Place 1 capsule (18 mcg total) into inhaler and inhale daily. 07/02/14  Yes [name], MD  traMADol (ULTRAM) 50 MG tablet Take 1 tablet (50 mg total) by mouth every 6 (six) hours as needed for moderate pain. 10/20/15  Yes Gladstone Lighter, MD  verapamil (CALAN) 120 MG tablet Take 1 tablet  (120 mg total) by mouth every 12 (twelve) hours. 10/20/15  Yes Gladstone Lighter, MD  furosemide (LASIX) 20 MG tablet Take 1 tablet (20 mg total) by mouth daily. Patient not taking: Reported on 01/14/2016 10/20/15   Gladstone Lighter, MD    Physical Exam: Vitals:   01/15/16 2216 01/16/16 0033 01/16/16 0120  BP: (!) 162/119  143/84  Pulse: 86  77  Resp: 20  17  Temp: 99.4 F (37.4 C) 97.9 F (36.6 C)   TempSrc: Oral Rectal   SpO2: 98%  99%      Constitutional: NAD, calm, comfortable Vitals:   01/15/16 2216 01/16/16 0033 01/16/16 0120  BP: (!) 162/119  143/84  Pulse: 86  77  Resp: 20  17  Temp: 99.4 F (37.4 C) 97.9 F (36.6 C)   TempSrc: Oral Rectal   SpO2: 98%  99%   Eyes: PERRL, lids and conjunctivae normal ENMT: Mucous membranes are very DRY. Posterior pharynx clear of any exudate or lesions. Neck: normal appearance, supple, no masses Respiratory: clear to auscultation listening anteriorly, no wheezing but she has mild increase in work of breathing.  No accessory muscle use.  Cardiovascular: Normal rate, regular rhythm, no murmurs / rubs / gallops. 2+ pitting edema in both legs. 2+ pedal pulses. No carotid bruits.  GI: abdomen is soft and compressible.  No distention.  No tenderness.  Bowel sounds are present. Musculoskeletal:  No joint deformity in upper and lower extremities. Good ROM, no contractures. Normal muscle tone.  Skin: no rashes, warm and dry Neurologic: CN 2-12 grossly intact. Sensation intact, Strength symmetric bilaterally, 5/5  Psychiatric: She oriented to person and place right now with impaired judgment/insight.     Labs on Admission: I have personally reviewed following labs and imaging studies  CBC:  Recent Labs Lab 01/13/16 2346 01/15/16 2223  WBC 11.6* 12.5*  NEUTROABS 6.1 6.0  HGB 13.7 13.8  HCT 42.6 43.5  MCV 89.9 91.0  PLT 238 [id number]   Basic Metabolic Panel:  Recent Labs Lab 01/13/16 2346 01/15/16 2223  NA 138 139  K 4.1 3.6    CL 96*  95*  CO2 34* 34*  GLUCOSE 169* 254*  BUN 16 17  CREATININE 0.95 0.96  CALCIUM 9.2 9.0   GFR: Estimated Creatinine Clearance: 72.9 mL/min (by C-G formula based on SCr of 0.96 mg/dL). Liver Function Tests:  Recent Labs Lab 01/15/16 2223  AST 17  ALT 14  ALKPHOS 71  BILITOT 0.8  PROT 8.7*  ALBUMIN 3.7     Recent Labs Lab 01/16/16 0102  AMMONIA 42*   BNP 53  Urine analysis:    Component Value Date/Time   COLORURINE YELLOW 01/15/2016 2221   APPEARANCEUR CLOUDY (A) 01/15/2016 2221   APPEARANCEUR Hazy 01/20/2014 2317   LABSPEC 1.018 01/15/2016 2221   LABSPEC 1.011 01/20/2014 2317   PHURINE 6.0 01/15/2016 2221   GLUCOSEU >1000 (A) 01/15/2016 2221   GLUCOSEU Negative 01/20/2014 2317   HGBUR TRACE (A) 01/15/2016 2221   BILIRUBINUR NEGATIVE 01/15/2016 2221   BILIRUBINUR Negative 01/20/2014 2317   KETONESUR NEGATIVE 01/15/2016 2221   PROTEINUR NEGATIVE 01/15/2016 2221   NITRITE POSITIVE (A) 01/15/2016 2221   LEUKOCYTESUR SMALL (A) 01/15/2016 2221   LEUKOCYTESUR Negative 01/20/2014 2317    Radiological Exams on Admission: Dg Chest Port 1 View  Result Date: 01/16/2016 CLINICAL DATA:  Shortness of breath.  Altered mental status. EXAM: PORTABLE CHEST 1 VIEW COMPARISON:  01/13/2016 FINDINGS: Shallow inspiration. Linear atelectasis in the left mid lung. Cardiac enlargement. No focal consolidation or edema. No blunting of costophrenic angles. No pneumothorax. Tortuous aorta. IMPRESSION: Shallow inspiration with linear atelectasis in the left mid lung. Cardiac enlargement. Electronically Signed   By: Lucienne Capers M.D.   On: 01/16/2016 02:04   Ct Renal Stone Study  Result Date: 01/16/2016 CLINICAL DATA:  Right flank pain. White cell count 12.5. Red cells and white cells in the urine. History of chronic kidney disease, hypertension, diabetes. EXAM: CT ABDOMEN AND PELVIS WITHOUT CONTRAST TECHNIQUE: Multidetector CT imaging of the abdomen and pelvis was performed  following the standard protocol without IV contrast. COMPARISON:  11/30/2014 FINDINGS: Lower chest: Small bilateral pleural effusions. Atelectasis in the lung bases. Cardiac enlargement. Hepatobiliary: No focal liver abnormality is seen. Status post cholecystectomy. No biliary dilatation. Pancreas: Unremarkable. No pancreatic ductal dilatation or surrounding inflammatory changes. Spleen: Normal in size without focal abnormality. Adrenals/Urinary Tract: Left adrenal gland nodule measures 2.7 cm maximal diameter. Density measurements are 3.2 consistent with benign fat containing adenoma. No change since previous study. Non cystic mass demonstrated in the anterior right kidney measuring 3.8 cm maximal diameter. This demonstrates enlargement since previous study. Appearance is worrisome for renal cell carcinoma. Recommend surgical consultation as has been previously suggested. Bilateral renal cysts, largest on the right measuring 7.1 cm diameter. No hydronephrosis or hydroureter. No renal, ureteral, or bladder stones. Bladder is decompressed. Stomach/Bowel: Stomach, small bowel, and colon are not abnormally distended. Stool fills the colon. No apparent inflammatory changes. Appendix is not identified. Vascular/Lymphatic: Aortic atherosclerosis. Prominent lymph nodes in the groin regions bilaterally, likely reactive. No significant retroperitoneal lymphadenopathy. Reproductive: Uterus is not enlarged. Right ovarian cyst measuring 2.9 cm, likely physiologic. Other: No abdominal wall hernia or abnormality. No abdominopelvic ascites. Musculoskeletal: Lucent lesion demonstrated in the right iliac crest without change since previous study. Degenerative changes in the lumbar spine IMPRESSION: No renal or ureteral stone or obstruction. There is an enlarging solid mass in the right kidney worrisome for renal cell carcinoma. Benign cysts in the kidneys. Left adrenal gland nodule consistent with adenoma. Less than 3 cm diameter  right ovarian cysts is likely physiologic.  Indeterminate lucent lesion in the right iliac bone is similar to prior study. Electronically Signed   By: Lucienne Capers M.D.   On: 01/16/2016 00:03    EKG: Reviewed.  NSR.  Nonspecific T wave inversions.  No acute ST segment changes.  Assessment/Plan Principal Problem:   Acute respiratory failure (HCC) Active Problems:   Hypertension   Diabetes mellitus (HCC)   Renal mass   Obesity   Chronic diastolic CHF (congestive heart failure) (HCC)   OSA (obstructive sleep apnea)   COPD (chronic obstructive pulmonary disease) (HCC)   Schizoaffective disorder, bipolar type (HCC)   UTI (urinary tract infection)   Altered mental status      Acute hypercarbic respiratory failure, history of COPD, history of medical noncompliance with BiPAP.  Of note, palliative care consult was actually recommended at her last discharge from [facility] in August. --Continuous BiPAP for now with repeat ABG per protocol.  Mental status waxing and waning. --Admit to the stepdown unit with continuous pulse oximetry --Scheduled duonebs.  Hold spiriva.  Advair formulary substitute --Incentive spirometer q1h while awake --I do not hear significant wheezing but she appears short of breath.  Avoid steroids for now due to prednisone allergy.  Respiratory therapy. --NPO except medications until she is weaned from BiPAP  UTI (and hypercarbia) causing acute metabolic encephalopathy --IV imipenem per pharmacy now for history of ESBL E coli UTI --Urine culture pending  History of CHF, normal BNP.  Mucous membranes are dry. --Hold lasix for now.  HTN --Continue Coreg, Verapamil  Renal mass --Known, previously biopsied.  No intervention.  DM --Continue home dose of lantus.  SSI coverage.     DVT prophylaxis: Lovenox Code Status: DNR Family Communication: Patient alone in the ED at the time of admission.  ED attending spoke to son prior to admission; he did not answer  the phone on subsequent attempt to contact him. Disposition Plan: Expect she will go back to SNF when ready for discharge. Consults called: NONE Admission status: Inpatient, stepdown unit   TIME SPENT: 75 minutes   Eber Jones MD [facility] Pager [phone]  If 7PM-7AM, please contact night-coverage [email or web address] Password [facility]  01/16/2016, 2:37 AM

## 2016-01-16 NOTE — Progress Notes (Signed)
Pharmacy Antibiotic Note  [name] [name] is a 70 y.o. female admitted on 01/15/2016 with recurrent UTI with hx of ESBL.  Pharmacy has been consulted for Primaxin dosing.  Plan: Primaxin 500mg  iv q6hr     Temp (24hrs), Avg:98.7 F (37.1 C), Min:97.9 F (36.6 C), Max:99.4 F (37.4 C)   Recent Labs Lab 01/13/16 2346 01/15/16 2223  WBC 11.6* 12.5*  CREATININE 0.95 0.96    Estimated Creatinine Clearance: 72.9 mL/min (by C-G formula based on SCr of 0.96 mg/dL).    Allergies  Allergen Reactions  . Haldol [Haloperidol Decanoate] Swelling and Other (See Comments)    Reaction:  Swelling of tongue and blurred vision    . Metformin Diarrhea  . Prednisone Other (See Comments)    Unknown reaction  . Raspberry Swelling and Other (See Comments)    Reaction:  Swelling of lips     Antimicrobials this admission: Primaxin 11/24 >>  Dose adjustments this admission: -  Microbiology results: pending  Thank you for allowing pharmacy to be a part of this patient's care.  Nani Skillern [place]ford 01/16/2016 3:13 AM

## 2016-01-17 ENCOUNTER — Inpatient Hospital Stay (HOSPITAL_COMMUNITY): Payer: Medicare HMO

## 2016-01-17 DIAGNOSIS — M7989 Other specified soft tissue disorders: Secondary | ICD-10-CM

## 2016-01-17 DIAGNOSIS — R0602 Shortness of breath: Secondary | ICD-10-CM

## 2016-01-17 LAB — BASIC METABOLIC PANEL
Anion gap: 6 (ref 5–15)
BUN: 13 mg/dL (ref 6–20)
CO2: 32 mmol/L (ref 22–32)
Calcium: 8.6 mg/dL — ABNORMAL LOW (ref 8.9–10.3)
Chloride: 103 mmol/L (ref 101–111)
Creatinine, Ser: 0.77 mg/dL (ref 0.44–1.00)
GFR calc Af Amer: >60 mL/min (ref >60)
GLUCOSE: 140 mg/dL — AB (ref 65–99)
POTASSIUM: 4.3 mmol/L (ref 3.5–5.1)
Sodium: 141 mmol/L (ref 135–145)

## 2016-01-17 LAB — CBC
HEMATOCRIT: 39.1 % (ref 36.0–46.0)
Hemoglobin: 12 g/dL (ref 12.0–15.0)
MCH: 28.2 pg (ref 26.0–34.0)
MCHC: 30.7 g/dL (ref 30.0–36.0)
MCV: 91.8 fL (ref 78.0–100.0)
PLATELETS: 232 10*3/uL (ref 150–400)
RBC: 4.26 MIL/uL (ref 3.87–5.11)
RDW: 15.9 % — AB (ref 11.5–15.5)
WBC: 10.5 10*3/uL (ref 4.0–10.5)

## 2016-01-17 LAB — GLUCOSE, CAPILLARY
GLUCOSE-CAPILLARY: 120 mg/dL — AB (ref 65–99)
GLUCOSE-CAPILLARY: 129 mg/dL — AB (ref 65–99)
GLUCOSE-CAPILLARY: 146 mg/dL — AB (ref 65–99)
GLUCOSE-CAPILLARY: 163 mg/dL — AB (ref 65–99)
GLUCOSE-CAPILLARY: 177 mg/dL — AB (ref 65–99)
GLUCOSE-CAPILLARY: 187 mg/dL — AB (ref 65–99)
Glucose-Capillary: 160 mg/dL — ABNORMAL HIGH (ref 65–99)

## 2016-01-17 LAB — AMMONIA: Ammonia: 35 umol/L (ref 9–35)

## 2016-01-17 LAB — MAGNESIUM: Magnesium: 1.8 mg/dL (ref 1.7–2.4)

## 2016-01-17 MED ORDER — CARVEDILOL 25 MG PO TABS
25.0000 mg | ORAL_TABLET | Freq: Two times a day (BID) | ORAL | Status: DC
  Administered 2016-01-17 – 2016-01-21 (×8): 25 mg via ORAL
  Filled 2016-01-17 (×5): qty 2
  Filled 2016-01-17: qty 1
  Filled 2016-01-17: qty 2
  Filled 2016-01-17: qty 1

## 2016-01-17 MED ORDER — IPRATROPIUM-ALBUTEROL 0.5-2.5 (3) MG/3ML IN SOLN
3.0000 mL | Freq: Three times a day (TID) | RESPIRATORY_TRACT | Status: DC
Start: 2016-01-18 — End: 2016-01-21
  Administered 2016-01-18 – 2016-01-21 (×10): 3 mL via RESPIRATORY_TRACT
  Filled 2016-01-17 (×11): qty 3

## 2016-01-17 NOTE — Progress Notes (Signed)
PROGRESS NOTE  [name] [name] [id number] DOB: Oct 03, 1945 DOA: 01/15/2016 PCP: Alvester Chou, NP  Brief Summary:   Patient is a SNF resident, she called EMS due to c/o right sided flank pain and feeling sob, she was found with confusion, venous blood gas co2 of 80. She is admitted to stepdown on bipap  Per SNF, patient is not compliant with her home o2 and nightly bipap Patient report baseline wheelchair bound, but able to stand up with a walker  HPI/Recap of past 24 hours:  Very poor historian , slow in answering questions, admit she feels better  report less right sided flank pain /right lower quadrant abdominal pain,  no fever, no n/v,    Assessment/Plan: Principal Problem:   Acute on chronic respiratory failure with hypoxia and hypercapnia (HCC) Active Problems:   Hypertension   Diabetes mellitus ([place])   Renal mass   Morbid obesity ([place])   Chronic diastolic CHF (congestive heart failure) (HCC)   OSA (obstructive sleep apnea)   COPD (chronic obstructive pulmonary disease) (HCC)   Schizoaffective disorder, bipolar type (HCC)   UTI (urinary tract infection)   Altered mental status   Metabolic encephalopathy  Acute on chronic hypercarbic/hypoxic  respiratory failure, history of COPD, suppose to be on home 02 2liter and nightly bipap, but  history of medical noncompliance with home o2 or BiPAP.  Of note, palliative care consult was actually recommended at her last discharge from [facility] in August. --she is admitted to stepdown, better, off bipap at day time, due to the need of nightly bipap, will keep in stepdown ---Scheduled duonebs.  Hold spiriva.  Advair formulary substitute --Incentive spirometer q1h while awake --no significant wheezing,.  Avoid steroids for now due to prednisone allergy.  Respiratory therapy. --start diet as tolerated  UTI (and hypercarbia) causing acute metabolic encephalopathy --IV imipenem per pharmacy now for history of ESBL E coli  UTI --Urine culture pending  History of CHF, normal BNP.  Mucous membranes are dry. Does has trace lower extremity edema --s/p gentle hydration,  -Hold lasix for now. -Close monitor volume status  Lower extremity edema: will get venous doppler to r/o DVT  Right sided flank pain, On exam , no tender appreciated, patient is not able to pin point location CT renal stone on admission: No renal or ureteral stone or obstruction. There is an enlarging solid mass in the right kidney worrisome for renal cell carcinoma. Benign cysts in the kidneys. Left adrenal gland nodule consistent with adenoma. Less than 3 cm diameter right ovarian cysts is likely physiologic. Indeterminate lucent lesion in the right iliac bone is similar to prior study.  HTN --verapamil d/ed due to bp low normal initially ,  --now bp better, will increase Coreg, continue hold verapamil, consider permnantly d/c verapamil if coreg alone can get bp to goal   Renal mass --Known, previously biopsied.  No intervention.  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  SSI coverage.  H/o schizophernia , stable on home meds fluphenazine, depakote, cogentin Very poor historian  Morbid obesity: Body mass index is 46.22 kg/m.  FTT, baseline wheelchair bound, report able to use walker to stand up  DVT prophylaxis: Lovenox Code Status: DNR Family Communication:  ED attending spoke to son prior to admission; he did not answer the phone on subsequent attempt to contact him. Disposition Plan: Expect she will go back to SNF in 1-2 days Consults called: NONE   Procedures:  nightly bipap  Antibiotics:  imipenem   Objective: BP (!) 147/91   Pulse 72   Temp 97.7 F (36.5 C) (Oral)   Resp (!) 26   Ht 5\' 5"  (1.651 m)   Wt 126 kg (277 lb 12.5 oz)   SpO2 97%   BMI 46.22 kg/m   Intake/Output Summary (Last 24 hours) at 01/17/16 1408 Last data filed at 01/17/16 1000  Gross per 24  hour  Intake             1803 ml  Output              300 ml  Net             1503 ml   Filed Weights   01/16/16 0346  Weight: 126 kg (277 lb 12.5 oz)    Exam:   General:  NAD, slight confusion, slow in answering questions, able to correct self and states the right time/place.  Cardiovascular: RRR  Respiratory: diminished, no wheezing  Abdomen: Soft/ND/NT, positive BS  Musculoskeletal: trace pitting Edema bilateral lower extremity  Neuro: slight confusion, slow in answering questions, able to correct self and states the right time/place.  Data Reviewed: Basic Metabolic Panel:  Recent Labs Lab 01/13/16 2346 01/15/16 2223 01/16/16 0526 01/17/16 0353  NA 138 139 140 141  K 4.1 3.6 3.5 4.3  CL 96* 95* 98* 103  CO2 34* 34* 35* 32  GLUCOSE 169* 254* 183* 140*  BUN 16 17 16 13   CREATININE 0.95 0.96 0.88 0.77  CALCIUM 9.2 9.0 8.6* 8.6*  MG  --   --   --  1.8   Liver Function Tests:  Recent Labs Lab 01/15/16 2223  AST 17  ALT 14  ALKPHOS 71  BILITOT 0.8  PROT 8.7*  ALBUMIN 3.7   No results for input(s): LIPASE, AMYLASE in the last 168 hours.  Recent Labs Lab 01/16/16 0102 01/17/16 0353  AMMONIA 42* 35   CBC:  Recent Labs Lab 01/13/16 2346 01/15/16 2223 01/16/16 0526 01/17/16 0353  WBC 11.6* 12.5* 12.7* 10.5  NEUTROABS 6.1 6.0  --   --   HGB 13.7 13.8 11.3* 12.0  HCT 42.6 43.5 36.6 39.1  MCV 89.9 91.0 91.3 91.8  PLT 238 230 219 232   Cardiac Enzymes:   No results for input(s): CKTOTAL, CKMB, CKMBINDEX, TROPONINI in the last 168 hours. BNP (last 3 results)  Recent Labs  10/12/15 2032 01/13/16 2346 01/15/16 2223  BNP 37.0 53.7 53.8    ProBNP (last 3 results) No results for input(s): PROBNP in the last 8760 hours.  CBG:  Recent Labs Lab 01/16/16 2016 01/17/16 0015 01/17/16 0408 01/17/16 0822 01/17/16 1153  GLUCAP 201* 160* 129* 120* 163*    Recent Results (from the past 240 hour(s))  Urine culture     Status: Abnormal  (Preliminary result)   Collection Time: 01/15/16 10:21 PM  Result Value Ref Range Status   Specimen Description URINE, RANDOM  Final   Special Requests NONE  Final   Culture >=100,000 COLONIES/mL ESCHERICHIA COLI (A)  Final   Report Status PENDING  Incomplete  MRSA PCR Screening     Status: None   Collection Time: 01/16/16  3:53 AM  Result Value Ref Range Status   MRSA by PCR NEGATIVE NEGATIVE Final    Comment:        The GeneXpert MRSA Assay (FDA approved for NASAL specimens only), is one component of a comprehensive MRSA colonization surveillance program. It is not intended to diagnose MRSA  infection nor to guide or monitor treatment for MRSA infections.      Studies: No results found.  Scheduled Meds: . atorvastatin  20 mg Oral QHS  . benztropine  0.5 mg Oral BID  . carvedilol  12.5 mg Oral BID  . chlorhexidine  15 mL Mouth Rinse BID  . divalproex  1,000 mg Oral QHS  . enoxaparin (LOVENOX) injection  60 mg Subcutaneous Q24H  . famotidine  20 mg Oral BID  . fluPHENAZine  10 mg Oral Daily  . imipenem-cilastatin  500 mg Intravenous Q6H  . insulin aspart  0-15 Units Subcutaneous Q4H  . insulin glargine  10 Units Subcutaneous QHS  . ipratropium-albuterol  3 mL Nebulization QID  . mouth rinse  15 mL Mouth Rinse q12n4p  . mometasone-formoterol  2 puff Inhalation BID  . polyethylene glycol  17 g Oral Daily  . senna-docusate  1 tablet Oral BID  . sodium chloride flush  3 mL Intravenous Q12H    Continuous Infusions:    Time spent: 25 mins  Reece Fehnel MD, PhD  [facility] Pager [phone]. If 7PM-7AM, please contact night-coverage at [email or web address], password [email or web address] 01/17/2016, 2:08 PM  LOS: 1 day

## 2016-01-17 NOTE — Progress Notes (Signed)
OT Cancellation Note  Patient Details Name: [name] [name] MRN: [id number] DOB: 11-Aug-1945   Cancelled Treatment:    Reason Eval/Treat Not Completed: Other (comment) -- Patient is a SNF resident and plan is to return to SNF at discharge. Will defer OT needs to SNF.  Camille Thau A 01/17/2016, 1:13 PM

## 2016-01-17 NOTE — Progress Notes (Signed)
VASCULAR LAB PRELIMINARY  PRELIMINARY  PRELIMINARY  PRELIMINARY  Bilateral lower extremity venous duplex completed.    Preliminary report:  There is no obvious evidence of DVT or SVT noted in the bilateral lower extremities.    Nikka Hakimian, RVT 01/17/2016, 11:14 AM

## 2016-01-17 NOTE — Progress Notes (Signed)
  PT agreed to use CPAP while sleeping during day. RT placed PT on Auto BiPAP Max 20cm - Min 10cm / Max PS 8- Min PS 2 / with 2 lpm 02 bleed in. RN aware. PT states she is getting enough air and able to exhale comfortably- current Sp02 97%.

## 2016-01-17 NOTE — Progress Notes (Signed)
RT encouraged CPAP usage during naps- PT states she wants to utilize only during night time sleep. PT appears to be oriented to time, place and president at this time.

## 2016-01-18 LAB — URINE CULTURE: Culture: >=100000 — AB

## 2016-01-18 LAB — GLUCOSE, CAPILLARY
GLUCOSE-CAPILLARY: 148 mg/dL — AB (ref 65–99)
GLUCOSE-CAPILLARY: 133 mg/dL — AB (ref 65–99)
GLUCOSE-CAPILLARY: 167 mg/dL — AB (ref 65–99)
GLUCOSE-CAPILLARY: 222 mg/dL — AB (ref 65–99)
Glucose-Capillary: 126 mg/dL — ABNORMAL HIGH (ref 65–99)

## 2016-01-18 MED ORDER — LISINOPRIL 10 MG PO TABS
5.0000 mg | ORAL_TABLET | Freq: Every day | ORAL | Status: DC
  Administered 2016-01-18 – 2016-01-21 (×4): 5 mg via ORAL
  Filled 2016-01-18: qty 2
  Filled 2016-01-18: qty 1
  Filled 2016-01-18 (×2): qty 2

## 2016-01-18 MED ORDER — LORAZEPAM 2 MG/ML IJ SOLN: 1.0000 mg | Freq: Once | INTRAMUSCULAR | Status: DC

## 2016-01-18 MED ORDER — FUROSEMIDE 40 MG PO TABS
40.0000 mg | ORAL_TABLET | Freq: Every day | ORAL | Status: DC
  Administered 2016-01-18 – 2016-01-21 (×5): 40 mg via ORAL
  Filled 2016-01-18 (×4): qty 1

## 2016-01-18 MED ORDER — HALOPERIDOL LACTATE 5 MG/ML IJ SOLN: 5.0000 mg | Freq: Once | INTRAMUSCULAR | Status: DC

## 2016-01-18 NOTE — Evaluation (Signed)
Physical Therapy Evaluation Patient Details Name: [name] [name] MRN: [id number] DOB: 25-Feb-1945 Today's Date: 01/18/2016   History of Present Illness  70 y.o. female admitted from SNF with acute on chronic respiratory failure with hypoxia, hypercapnia, UTI, renal mass, metabolic encephalopathy. PMH of HTN, DM, CHF, COPD, schizoaffective d/o.   Clinical Impression  Pt admitted with above diagnosis. Pt currently with functional limitations due to the deficits listed below (see PT Problem List). +2 max assist for supine to sit, +2 mod assist for stand pivot transfer to recliner with RW, SaO2 98% on 2L O2 with activity.  Pt will benefit from skilled PT to increase their independence and safety with mobility to allow discharge to the venue listed below.       Follow Up Recommendations SNF;Supervision/Assistance - 24 hour    Equipment Recommendations  Rolling walker with 5" wheels    Recommendations for Other Services       Precautions / Restrictions Precautions Precautions: Fall Precaution Comments: pt not able to provide fall hx 2* confusion Restrictions Weight Bearing Restrictions: No      Mobility  Bed Mobility Overal bed mobility: +2 for physical assistance;Needs Assistance Bed Mobility: Supine to Sit     Supine to sit: +2 for physical assistance;Max assist;HOB elevated     General bed mobility comments: +2 to raise trunk and advance BLEs, pt 40%  Transfers Overall transfer level: Needs assistance Equipment used: Rolling walker (2 wheeled) Transfers: Sit to/from [facility] Sit to Stand: +2 safety/equipment;Mod assist Stand pivot transfers: +2 safety/equipment;Mod assist       General transfer comment: momentum for sit to stand, Mod A to rise/steady, increased time to take several pivotal steps to recliner with RW, B knees with mild buckling; SaO2 98% on 2L O2 during transfers  Ambulation/Gait             General Gait Details: deferred 2*  fatigue/knees buckling  Stairs            Wheelchair Mobility    Modified Rankin (Stroke Patients Only)       Balance Overall balance assessment: Needs assistance Sitting-balance support: Feet supported;Bilateral upper extremity supported Sitting balance-Leahy Scale: Poor Sitting balance - Comments: posterior lean in sitting requiring min A to come to neutral, could not maintain neutral independently Postural control: Posterior lean   Standing balance-Leahy Scale: Poor Standing balance comment: heavy reliance upon BUEs for support, B knees with mild buckling in standing but able to maintain upright position                             Pertinent Vitals/Pain Pain Assessment: Faces Faces Pain Scale: Hurts little more Pain Location: R flank Pain Descriptors / Indicators: Sore Pain Intervention(s): Monitored during session;Limited activity within patient's tolerance    Home Living Family/patient expects to be discharged to:: Skilled nursing facility                      Prior Function           Comments: pt poor historian, per chart she walks with a RW, however her RN stated she was told pt is WC bound     Hand Dominance   Dominant Hand: Right    Extremity/Trunk Assessment   Upper Extremity Assessment: Defer to OT evaluation           Lower Extremity Assessment: Generalized weakness (knee extension -4/5, B knees with some  buckling in standing)      Cervical / Trunk Assessment: Normal  Communication   Communication: No difficulties  Cognition Arousal/Alertness: Awake/alert Behavior During Therapy: WFL for tasks assessed/performed Overall Cognitive Status: Impaired/Different from baseline (per chart pt is normally oriented) Area of Impairment: Memory                    General Comments      Exercises     Assessment/Plan    PT Assessment Patient needs continued PT services  PT Problem List Decreased  strength;Decreased mobility;Decreased activity tolerance;Decreased balance;Decreased knowledge of use of DME;Decreased cognition;Pain;Obesity          PT Treatment Interventions DME instruction;Gait training;Functional mobility training;Balance training;Therapeutic exercise;Therapeutic activities;Patient/family education    PT Goals (Current goals can be found in the Care Plan section)  Acute Rehab PT Goals Patient Stated Goal: none stated PT Goal Formulation: Patient unable to participate in goal setting Time For Goal Achievement: 02/01/16 Potential to Achieve Goals: Good    Frequency Min 3X/week   Barriers to discharge        Co-evaluation               End of Session Equipment Utilized During Treatment: Gait belt;Oxygen Activity Tolerance: Patient limited by fatigue Patient left: in chair;with call bell/phone within reach;with chair alarm set Nurse Communication: Mobility status         Time: 1347-1406 PT Time Calculation (min) (ACUTE ONLY): 19 min   Charges:   PT Evaluation $PT Eval Moderate Complexity: 1 Procedure     PT G Codes:        Philomena Doheny 01/18/2016, 2:23 PM [phone]

## 2016-01-18 NOTE — Progress Notes (Signed)
RT placed PT on Auto BiPAP while sleeping. PT states she is getting enough air and able to exhale comfortably- 2 lpm 02 bleed in. RN aware.

## 2016-01-18 NOTE — Progress Notes (Addendum)
PROGRESS NOTE  SHAYLEY KILBER [id number] DOB: 1946-01-06 DOA: 01/15/2016 PCP: Alvester Chou, NP  Brief Summary:   Patient is a SNF resident, she called EMS due to c/o right sided flank pain and feeling sob, she was found with confusion, venous blood gas co2 of 80. She is admitted to stepdown on bipap  Per SNF, patient is not compliant with her home o2 and nightly bipap Son report patient able to ambulate with a walker  HPI/Recap of past 24 hours:  Not a reliable historian ,very slow in answering questions, know the month and year, know she is in the hospital,  not able to state the name of the hospital Report right sided pain get worse with taking deep breath, not sure if this information is reliable ( per son, she started compliant right sided pain a week ago)  no fever, no n/v,    Assessment/Plan: Principal Problem:   Acute on chronic respiratory failure with hypoxia and hypercapnia (HCC) Active Problems:   Hypertension   Diabetes mellitus ([place])   Renal mass   Morbid obesity ([place])   Chronic diastolic CHF (congestive heart failure) (HCC)   OSA (obstructive sleep apnea)   COPD (chronic obstructive pulmonary disease) ([place])   Schizoaffective disorder, bipolar type ([place])   UTI (urinary tract infection)   Altered mental status   Metabolic encephalopathy  Acute on chronic hypercarbic/hypoxic  respiratory failure, history of COPD, suppose to be on home 02 2liter and nightly bipap, but  history of medical noncompliance with home o2 or BiPAP.  Of note, palliative care consult was actually recommended at her last discharge from [facility] in August. --VBG c02 80 on admission, she is put on bipap and admitted to stepdown,  - Scheduled duonebs.  Hold spiriva.  Advair formulary substitute --Incentive spirometer q1h while awake --no significant wheezing,.  Avoid steroids for now due to prednisone allergy.  Respiratory therapy. --better off bipap at day time, due to the need of nightly  bipap, will keep in stepdown  UTI (and hypercarbia) causing acute metabolic encephalopathy (per son, patient at baseline does not have confusion, she get confused when she had uti) --patient has hypotension and leukocytosis initially, she received IV imipenem per pharmacy sicne admission for history of ESBL E coli UTI --Urine culture pending -remain slightly confused and very slow in answering questions as of 11/26, bp stabilized, leukocytosis has resolved  History of diastolic CHF, normal BNP.  Mucous membranes are dry on presentation. Does has trace lower extremity edema --s/p gentle hydration,  - lasix held since admission, resumed at lower dose on 11/26 -Close monitor volume status  Lower extremity edema: venous doppler negative for DVT, elevate legs  Right sided flank pain vs right lower quadrant pain, report right sided pain worse with taking deep breath On exam , no significant tender appreciated, patient is not able to pin point location CT renal stone on admission: No renal or ureteral stone or obstruction. There is an enlarging solid mass in the right kidney worrisome for renal cell carcinoma. Benign cysts in the kidneys. Left adrenal gland nodule consistent with adenoma. Less than 3 cm diameter right ovarian cysts is likely physiologic. Indeterminate lucent lesion in the right iliac bone is similar to prior study.  I have explained to son over the phone, that the pain could be from uti, constipation or enlarging tumor. Will treat uti and constipation here, patient will need to follow up with Dr Armando Gang at [facility] for the enlarging right kidney  tumor.  HTN --lasix and verapamil d/ed due to hypotension initially , --now bp better, continue hold verapamil ( tendency to have constipation)  - increase Coreg, start lisinopril and titrate, resume lasix at a lower dose 40mg  qd ( she is on 40mg  bid at home)   Renal mass --Known, previously biopsied.  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  SSI coverage.  H/o schizophernia , stable on home meds fluphenazine, depakote, cogentin Very poor historian  Morbid obesity: Body mass index is 46.22 kg/m.  FTT, Pt/OT, snf  DVT prophylaxis: Lovenox Code Status: DNR Family Communication:  son updated over the phone on 11/26. Disposition Plan: Expect she will go back to SNF in 1-2 days Consults called: NONE   Procedures:  nightly bipap  Antibiotics:  imipenem   Objective: BP (!) 173/84   Pulse 76   Temp 98.8 F (37.1 C) (Oral)   Resp (!) 22   Ht 5\' 5"  (1.651 m)   Wt 126 kg (277 lb 12.5 oz)   SpO2 97%   BMI 46.22 kg/m   Intake/Output Summary (Last 24 hours) at 01/18/16 1102 Last data filed at 01/18/16 0500  Gross per 24 hour  Intake              560 ml  Output                0 ml  Net              560 ml   Filed Weights   01/16/16 0346  Weight: 126 kg (277 lb 12.5 oz)    Exam:   General:  NAD, slight confusion, slow in answering questions, able to correct self and states the right time/place.  Cardiovascular: RRR  Respiratory: diminished, no wheezing  Abdomen: Soft/ND/NT, positive BS, right lower quadrant tenderness? Very vague  Musculoskeletal: trace pitting Edema bilateral lower extremity  Neuro: slight confusion, slow in answering questions, able to correct self and states the right time/place.  Data Reviewed: Basic Metabolic Panel:  Recent Labs Lab 01/13/16 2346 01/15/16 2223 01/16/16 0526 01/17/16 0353  NA 138 139 140 141  K 4.1 3.6 3.5 4.3  CL 96* 95* 98* 103  CO2 34* 34* 35* 32  GLUCOSE 169* 254* 183* 140*  BUN 16 17 16 13   CREATININE 0.95 0.96 0.88 0.77  CALCIUM 9.2 9.0 8.6* 8.6*  MG  --   --   --  1.8   Liver Function Tests:  Recent Labs Lab 01/15/16 2223  AST 17  ALT 14  ALKPHOS 71  BILITOT 0.8  PROT  8.7*  ALBUMIN 3.7   No results for input(s): LIPASE, AMYLASE in the last 168 hours.  Recent Labs Lab 01/16/16 0102 01/17/16 0353  AMMONIA 42* 35   CBC:  Recent Labs Lab 01/13/16 2346 01/15/16 2223 01/16/16 0526 01/17/16 0353  WBC 11.6* 12.5* 12.7* 10.5  NEUTROABS 6.1 6.0  --   --   HGB 13.7 13.8 11.3* 12.0  HCT 42.6 43.5 36.6 39.1  MCV 89.9 91.0 91.3 91.8  PLT 238 230 219 232   Cardiac Enzymes:   No results for input(s): CKTOTAL, CKMB, CKMBINDEX, TROPONINI in the last 168 hours. BNP (last 3 results)  Recent Labs  10/12/15 2032 01/13/16 2346 01/15/16 2223  BNP 37.0 53.7 53.8    ProBNP (last  3 results) No results for input(s): PROBNP in the last 8760 hours.  CBG:  Recent Labs Lab 01/17/16 1546 01/17/16 2013 01/17/16 2337 01/18/16 0353 01/18/16 0748  GLUCAP 146* 187* 177* 148* 133*    Recent Results (from the past 240 hour(s))  Urine culture     Status: Abnormal   Collection Time: 01/15/16 10:21 PM  Result Value Ref Range Status   Specimen Description URINE, RANDOM  Final   Special Requests NONE  Final   Culture (A)  Final    >=100,000 COLONIES/mL ESCHERICHIA COLI Confirmed Extended Spectrum Beta-Lactamase Producer (ESBL) Performed at [facility]    Report Status 01/18/2016 FINAL  Final   Organism ID, Bacteria ESCHERICHIA COLI (A)  Final      Susceptibility   Escherichia coli - MIC*    AMPICILLIN >=32 RESISTANT Resistant     CEFAZOLIN >=64 RESISTANT Resistant     CEFTRIAXONE >=64 RESISTANT Resistant     CIPROFLOXACIN >=4 RESISTANT Resistant     GENTAMICIN <=1 SENSITIVE Sensitive     IMIPENEM <=0.25 SENSITIVE Sensitive     NITROFURANTOIN <=16 SENSITIVE Sensitive     TRIMETH/SULFA >=320 RESISTANT Resistant     AMPICILLIN/SULBACTAM >=32 RESISTANT Resistant     PIP/TAZO <=4 SENSITIVE Sensitive     Extended ESBL POSITIVE Resistant     * >=100,000 COLONIES/mL ESCHERICHIA COLI  MRSA PCR Screening     Status: None   Collection Time:  01/16/16  3:53 AM  Result Value Ref Range Status   MRSA by PCR NEGATIVE NEGATIVE Final    Comment:        The GeneXpert MRSA Assay (FDA approved for NASAL specimens only), is one component of a comprehensive MRSA colonization surveillance program. It is not intended to diagnose MRSA infection nor to guide or monitor treatment for MRSA infections.      Studies: No results found.  Scheduled Meds: . atorvastatin  20 mg Oral QHS  . benztropine  0.5 mg Oral BID  . carvedilol  25 mg Oral BID  . chlorhexidine  15 mL Mouth Rinse BID  . divalproex  1,000 mg Oral QHS  . enoxaparin (LOVENOX) injection  60 mg Subcutaneous Q24H  . famotidine  20 mg Oral BID  . fluPHENAZine  10 mg Oral Daily  . imipenem-cilastatin  500 mg Intravenous Q6H  . insulin aspart  0-15 Units Subcutaneous Q4H  . insulin glargine  10 Units Subcutaneous QHS  . ipratropium-albuterol  3 mL Nebulization TID  . mouth rinse  15 mL Mouth Rinse q12n4p  . mometasone-formoterol  2 puff Inhalation BID  . polyethylene glycol  17 g Oral Daily  . senna-docusate  1 tablet Oral BID  . sodium chloride flush  3 mL Intravenous Q12H    Continuous Infusions:    Time spent: 25 mins  Brylinn Teaney MD, PhD  [facility] Pager [phone]. If 7PM-7AM, please contact night-coverage at [email or web address], password [facility] 01/18/2016, 11:02 AM  LOS: 2 days

## 2016-01-19 DIAGNOSIS — J9622 Acute and chronic respiratory failure with hypercapnia: Secondary | ICD-10-CM

## 2016-01-19 DIAGNOSIS — G9341 Metabolic encephalopathy: Secondary | ICD-10-CM

## 2016-01-19 DIAGNOSIS — N390 Urinary tract infection, site not specified: Secondary | ICD-10-CM

## 2016-01-19 DIAGNOSIS — J9621 Acute and chronic respiratory failure with hypoxia: Principal | ICD-10-CM

## 2016-01-19 LAB — MAGNESIUM: Magnesium: 1.9 mg/dL (ref 1.7–2.4)

## 2016-01-19 LAB — GLUCOSE, CAPILLARY
GLUCOSE-CAPILLARY: 118 mg/dL — AB (ref 65–99)
GLUCOSE-CAPILLARY: 195 mg/dL — AB (ref 65–99)
GLUCOSE-CAPILLARY: 196 mg/dL — AB (ref 65–99)
Glucose-Capillary: 128 mg/dL — ABNORMAL HIGH (ref 65–99)
Glucose-Capillary: 132 mg/dL — ABNORMAL HIGH (ref 65–99)
Glucose-Capillary: 142 mg/dL — ABNORMAL HIGH (ref 65–99)

## 2016-01-19 LAB — BASIC METABOLIC PANEL
ANION GAP: 8 (ref 5–15)
BUN: 14 mg/dL (ref 6–20)
CALCIUM: 8.9 mg/dL (ref 8.9–10.3)
CO2: 33 mmol/L — ABNORMAL HIGH (ref 22–32)
CREATININE: 0.84 mg/dL (ref 0.44–1.00)
Chloride: 98 mmol/L — ABNORMAL LOW (ref 101–111)
GFR calc Af Amer: >60 mL/min (ref >60)
GLUCOSE: 130 mg/dL — AB (ref 65–99)
Potassium: 3.9 mmol/L (ref 3.5–5.1)
Sodium: 139 mmol/L (ref 135–145)

## 2016-01-19 NOTE — Progress Notes (Signed)
PROGRESS NOTE  [name] [name] [id number] DOB: [birth year] DOA: 01/15/2016 PCP: Alvester Chou, NP  Brief Summary:   Patient is a SNF resident, she called EMS due to c/o right sided flank pain and feeling sob, she was found with confusion, venous blood gas co2 of 80. She is admitted to stepdown on bipap  Per SNF, patient is not compliant with her home o2 and nightly bipap Son report patient able to ambulate with a walker  HPI/Recap of past 24 hours:  No new complaints, able to use BIPAP at night, . More alert today.    Assessment/Plan: Principal Problem:   Acute on chronic respiratory failure with hypoxia and hypercapnia (HCC) Active Problems:   Hypertension   Diabetes mellitus (HCC)   Renal mass   Morbid obesity (HCC)   Chronic diastolic CHF (congestive heart failure) (HCC)   OSA (obstructive sleep apnea)   COPD (chronic obstructive pulmonary disease) (HCC)   Schizoaffective disorder, bipolar type (HCC)   UTI (urinary tract infection)   Altered mental status   Metabolic encephalopathy  Acute on chronic hypercarbic/hypoxic  respiratory failure, history of COPD, suppose to be on home 02 2liter and nightly bipap, but  history of medical noncompliance with home o2 or BiPAP.  Of note, palliative care consult was actually recommended at her last discharge from [facility] in August. --VBG c02 80 on admission, she is put on bipap and admitted to stepdown, today transferred to telemetry with bipap to be used at night.  - Scheduled duonebs.  Hold spiriva.  Advair formulary substitute --Incentive spirometer q1h while awake --no significant wheezing,.  Avoid steroids for now due to prednisone allergy.  Respiratory therapy. --better off bipap at day time.   UTI with ESBL E coli (and hypercarbia) causing acute metabolic encephalopathy (per son, patient at baseline does not have confusion, she get confused when she had uti) --patient has hypotension and leukocytosis initially, she  received IV imipenem per pharmacy sicne admission for history of ESBL E coli UTI --Urine culture shows ESBL UTI -remain slightly confused and very slow in answering questions , BUT much alert today.   History of diastolic CHF, normal BNP.  Mucous membranes are dry on presentation. Does has trace lower extremity edema --s/p gentle hydration,  - lasix held since admission, resumed at lower dose on 11/26 -Close monitor volume status - 2 lit of [place] Oxygen during day time.   Lower extremity edema: venous doppler negative for DVT, elevate legs  Right sided flank pain vs right lower quadrant pain, report right sided pain worse with taking deep breath On exam , no significant tender appreciated, patient is not able to pin point location CT renal stone on admission: No renal or ureteral stone or obstruction. There is an enlarging solid mass in the right kidney worrisome for renal cell carcinoma. Benign cysts in the kidneys. Left adrenal gland nodule consistent with adenoma. Less than 3 cm diameter right ovarian cysts is likely physiologic. Indeterminate lucent lesion in the right iliac bone is similar to prior study.  Dr Erlinda Hong explained to son over the phone, that the pain could be from uti, constipation or enlarging tumor. Will treat uti and constipation here, patient will need to follow up with Dr Armando Gang at [facility] for the enlarging right kidney tumor.  HTN --lasix and verapamil d/ed due to hypotension initially , --now bp better, continue hold verapamil ( tendency to have constipation)  - increase Coreg, start lisinopril and titrate, resume lasix at a lower  dose 40mg  qd ( she is on 40mg  bid at home) - well controlled today.    Renal mass --Known, previously biopsied.  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  SSI coverage. 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  FTT, Pt/OT, snf  DVT prophylaxis: Lovenox Code Status: DNR Family Communication:  son updated over the phone on 11/26. Disposition Plan: Expect she will go back to SNF in 1-2 days, transfer to tele today.  Consults called: NONE   Procedures:  nightly bipap  Antibiotics:  imipenem   Objective: BP 131/78 (BP Location: Left Arm)   Pulse 67   Temp 98.1 F (36.7 C) (Oral)   Resp 19   Ht 5\' 5"  (1.651 m)   Wt 126 kg (277 lb 12.5 oz)   SpO2 99%   BMI 46.22 kg/m   Intake/Output Summary (Last 24 hours) at 01/19/16 0958 Last data filed at 01/19/16 0348  Gross per 24 hour  Intake              220 ml  Output                0 ml  Net              220 ml   Filed Weights   01/16/16 0346  Weight: 126 kg (277 lb 12.5 oz)    Exam:   General:  NAD, slight confusion, slow in answering questions, more alert.   Cardiovascular: RRR  Respiratory: diminished, no wheezing  Abdomen: Soft/ND/NT, positive BS, right lower quadrant tenderness? Very vague  Musculoskeletal: trace pitting Edema bilateral lower extremity  Neuro: slight confusion, slow in answering questions, more alert today.   Data Reviewed: Basic Metabolic Panel:  Recent Labs Lab 01/13/16 2346 01/15/16 2223 01/16/16 0526 01/17/16 0353 01/19/16 0344  NA 138 139 140 141 139  K 4.1 3.6 3.5 4.3 3.9  CL 96* 95* 98* 103 98*  CO2 34* 34* 35* 32 33*  GLUCOSE 169* 254* 183* 140* 130*  BUN 16 17 16 13 14   CREATININE 0.95 0.96 0.88 0.77 0.84  CALCIUM 9.2 9.0 8.6* 8.6* 8.9  MG  --   --   --  1.8 1.9   Liver Function Tests:  Recent Labs Lab 01/15/16 2223  AST 17  ALT 14  ALKPHOS 71  BILITOT 0.8  PROT 8.7*  ALBUMIN 3.7   No results for input(s): LIPASE, AMYLASE in  the last 168 hours.  Recent Labs Lab 01/16/16 0102 01/17/16 0353  AMMONIA 42* 35   CBC:  Recent Labs Lab 01/13/16 2346 01/15/16 2223 01/16/16 0526 01/17/16 0353  WBC 11.6* 12.5* 12.7* 10.5  NEUTROABS 6.1 6.0  --   --   HGB 13.7 13.8 11.3* 12.0  HCT 42.6 43.5 36.6 39.1  MCV 89.9 91.0 91.3 91.8  PLT 238 230 219 232   Cardiac Enzymes:   No results for input(s): CKTOTAL, CKMB, CKMBINDEX, TROPONINI in the last 168 hours. BNP (last 3 results)  Recent Labs  10/12/15 2032 01/13/16 2346 01/15/16 2223  BNP 37.0 53.7 53.8    ProBNP (  last 3 results) No results for input(s): PROBNP in the last 8760 hours.  CBG:  Recent Labs Lab 01/18/16 1618 01/18/16 1951 01/19/16 0005 01/19/16 0338 01/19/16 0756  GLUCAP 167* 222* 142* 118* 132*    Recent Results (from the past 240 hour(s))  Urine culture     Status: Abnormal   Collection Time: 01/15/16 10:21 PM  Result Value Ref Range Status   Specimen Description URINE, RANDOM  Final   Special Requests NONE  Final   Culture (A)  Final    >=100,000 COLONIES/mL ESCHERICHIA COLI Confirmed Extended Spectrum Beta-Lactamase Producer (ESBL) Performed at [facility]    Report Status 01/18/2016 FINAL  Final   Organism ID, Bacteria ESCHERICHIA COLI (A)  Final      Susceptibility   Escherichia coli - MIC*    AMPICILLIN >=32 RESISTANT Resistant     CEFAZOLIN >=64 RESISTANT Resistant     CEFTRIAXONE >=64 RESISTANT Resistant     CIPROFLOXACIN >=4 RESISTANT Resistant     GENTAMICIN <=1 SENSITIVE Sensitive     IMIPENEM <=0.25 SENSITIVE Sensitive     NITROFURANTOIN <=16 SENSITIVE Sensitive     TRIMETH/SULFA >=320 RESISTANT Resistant     AMPICILLIN/SULBACTAM >=32 RESISTANT Resistant     PIP/TAZO <=4 SENSITIVE Sensitive     Extended ESBL POSITIVE Resistant     * >=100,000 COLONIES/mL ESCHERICHIA COLI  MRSA PCR Screening     Status: None   Collection Time: 01/16/16  3:53 AM  Result Value Ref Range Status   MRSA by PCR NEGATIVE  NEGATIVE Final    Comment:        The GeneXpert MRSA Assay (FDA approved for NASAL specimens only), is one component of a comprehensive MRSA colonization surveillance program. It is not intended to diagnose MRSA infection nor to guide or monitor treatment for MRSA infections.      Studies: No results found.  Scheduled Meds: . atorvastatin  20 mg Oral QHS  . benztropine  0.5 mg Oral BID  . carvedilol  25 mg Oral BID  . chlorhexidine  15 mL Mouth Rinse BID  . divalproex  1,000 mg Oral QHS  . enoxaparin (LOVENOX) injection  60 mg Subcutaneous Q24H  . famotidine  20 mg Oral BID  . fluPHENAZine  10 mg Oral Daily  . furosemide  40 mg Oral Daily  . imipenem-cilastatin  500 mg Intravenous Q6H  . insulin aspart  0-15 Units Subcutaneous Q4H  . insulin glargine  10 Units Subcutaneous QHS  . ipratropium-albuterol  3 mL Nebulization TID  . lisinopril  5 mg Oral Daily  . LORazepam  1 mg Intravenous Once  . mouth rinse  15 mL Mouth Rinse q12n4p  . mometasone-formoterol  2 puff Inhalation BID  . polyethylene glycol  17 g Oral Daily  . senna-docusate  1 tablet Oral BID  . sodium chloride flush  3 mL Intravenous Q12H    Continuous Infusions:    Time spent: 25 mins  Lyle Leisner MD,   [facility] Pager [phone] If 7PM-7AM, please contact night-coverage at [email or web address], password [facility] 01/19/2016, 9:58 AM  LOS: 3 days

## 2016-01-19 NOTE — Progress Notes (Signed)
Date:  January 19, 2016 Chart reviewed for concurrent status and case management needs. Will continue to follow patient progress.  Bipap Discharge Planning: following for needs Expected discharge date: [id number] Velva Harman, [place], [place], [place]

## 2016-01-19 NOTE — Progress Notes (Signed)
Pharmacy Antibiotic Note  [name] [name] is a 70 y.o. female with hx recurrent ESBL E.coli UTIs, presented to the ED on 01/15/2016 from [facility] with c/o AMS and right flank pain.  Primaxin started on admission for suspected UTI.  Ucx on 11/23 showed >100K ESBL Ecoli.  Today, 01/19/2016: - day #4 of abx - afeb, wbc wnl on 11/25 - scr stable at 0.84 (crcl~82)  Plan: - continue primaxin 500 mg IV q6h  ___________________________  Height: 5\' 5"  (165.1 cm) Weight: 277 lb 12.5 oz (126 kg) IBW/kg (Calculated) : 57  Temp (24hrs), Avg:98.6 F (37 C), Min:98.1 F (36.7 C), Max:99 F (37.2 C)   Recent Labs Lab 01/13/16 2346 01/15/16 2223 01/16/16 0526 01/17/16 0353 01/19/16 0344  WBC 11.6* 12.5* 12.7* 10.5  --   CREATININE 0.95 0.96 0.88 0.77 0.84    Estimated Creatinine Clearance: 82 mL/min (by C-G formula based on SCr of 0.84 mg/dL).    Allergies  Allergen Reactions  . Haldol [Haloperidol Decanoate] Swelling and Other (See Comments)    Reaction:  Swelling of tongue and blurred vision    . Metformin Diarrhea  . Prednisone Other (See Comments)    Unknown reaction  . Raspberry Swelling and Other (See Comments)    Reaction:  Swelling of lips     Antimicrobials this admission:  Gentamicin 11/24 x1 Primaxin 11/24 >>  Dose adjustments this admission:  n/a  Microbiology results:  11/23 UCx: >100K Ecoli +ESBL (S=imipenem, zosyn, gent, nitrofurantoin) 11/24 MRSA PCR: Negative  Thank you for allowing pharmacy to be a part of this patient's care.  Lynelle Doctor 01/19/2016 9:13 AM

## 2016-01-20 ENCOUNTER — Inpatient Hospital Stay (HOSPITAL_COMMUNITY): Payer: Medicare HMO

## 2016-01-20 DIAGNOSIS — J449 Chronic obstructive pulmonary disease, unspecified: Secondary | ICD-10-CM

## 2016-01-20 LAB — GLUCOSE, CAPILLARY
Glucose-Capillary: 110 mg/dL — ABNORMAL HIGH (ref 65–99)
Glucose-Capillary: 130 mg/dL — ABNORMAL HIGH (ref 65–99)
Glucose-Capillary: 154 mg/dL — ABNORMAL HIGH (ref 65–99)
Glucose-Capillary: 167 mg/dL — ABNORMAL HIGH (ref 65–99)
Glucose-Capillary: 190 mg/dL — ABNORMAL HIGH (ref 65–99)

## 2016-01-20 MED ORDER — TRAMADOL HCL 50 MG PO TABS: 100.0000 mg | ORAL_TABLET | Freq: Four times a day (QID) | ORAL | Status: DC | PRN

## 2016-01-20 MED ORDER — MORPHINE SULFATE (PF) 2 MG/ML IV SOLN: 2.0000 mg | Freq: Once | INTRAVENOUS | Status: DC

## 2016-01-20 MED ORDER — SODIUM CHLORIDE 0.9 % IV SOLN
1.0000 g | Freq: Three times a day (TID) | INTRAVENOUS | Status: DC
  Administered 2016-01-20 – 2016-01-21 (×3): 1 g via INTRAVENOUS
  Filled 2016-01-20 (×5): qty 1

## 2016-01-20 MED ORDER — INSULIN ASPART 100 UNIT/ML ~~LOC~~ SOLN
0.0000 [IU] | Freq: Three times a day (TID) | SUBCUTANEOUS | Status: DC
  Administered 2016-01-20 (×2): 3 [IU] via SUBCUTANEOUS
  Administered 2016-01-21: 2 [IU] via SUBCUTANEOUS
  Administered 2016-01-21: 3 [IU] via SUBCUTANEOUS

## 2016-01-20 NOTE — Progress Notes (Signed)
Pharmacy Antibiotic Note  [name] [name] is a 70 y.o. female with hx recurrent ESBL E.coli UTIs, presented to the ED on 01/15/2016 from [facility] with c/o AMS and right flank pain.  Primaxin started on admission for suspected UTI.  Ucx on 11/23 showed >100K ESBL Ecoli.  Merrem is now our preferred formulary carbapenem.  To change to merrem on 11/28  Today, 01/20/2016: - day #5 of abx - afeb, wbc wnl on 11/25 - scr stable at 0.84 (crcl~82)  Plan: - merrem 1 gm IV q8h  ___________________________  Height: 5\' 5"  (165.1 cm) Weight: 277 lb 12.5 oz (126 kg) IBW/kg (Calculated) : 57  Temp (24hrs), Avg:98.4 F (36.9 C), Min:98.1 F (36.7 C), Max:99.3 F (37.4 C)   Recent Labs Lab 01/13/16 2346 01/15/16 2223 01/16/16 0526 01/17/16 0353 01/19/16 0344  WBC 11.6* 12.5* 12.7* 10.5  --   CREATININE 0.95 0.96 0.88 0.77 0.84    Estimated Creatinine Clearance: 82 mL/min (by C-G formula based on SCr of 0.84 mg/dL).    Allergies  Allergen Reactions  . Haldol [Haloperidol Decanoate] Swelling and Other (See Comments)    Reaction:  Swelling of tongue and blurred vision    . Metformin Diarrhea  . Prednisone Other (See Comments)    Unknown reaction  . Raspberry Swelling and Other (See Comments)    Reaction:  Swelling of lips     Antimicrobials this admission:  Gentamicin 11/24 x1 11/24 Primaxin >> 11/28 11/28 merrem>>  Dose adjustments this admission:  n/a  Microbiology results:  11/23 UCx: >100K Ecoli +ESBL (S=imipenem, zosyn, gent, nitrofurantoin) 11/24 MRSA PCR: Negative  Thank you for allowing pharmacy to be a part of this patient's care.  Dia Sitter P 01/20/2016 1:10 PM

## 2016-01-20 NOTE — Clinical Social Work Note (Addendum)
Clinical Social Work Assessment  Patient Details  Name: [name] [name] MRN: [id number] Date of Birth: 1945-04-11  Date of referral:  01/20/16               Reason for consult:  Facility Placement, Discharge Planning                Permission sought to share information with:  Facility Art therapist granted to share information::  Yes, Verbal Permission Granted  Name::        Agency::     Relationship::     Contact Information:     Housing/Transportation Living arrangements for the past 2 months:  [place] of Information:  Adult Children Patient Interpreter Needed:  None Criminal Activity/Legal Involvement Pertinent to Current Situation/Hospitalization:  No - Comment as needed Significant Relationships:  Adult Children Lives with:  Facility Resident Do you feel safe going back to the place where you live?  Yes Need for family participation in patient care:  Yes (Comment)  Care giving concerns:  Pt's son has requested CSW to speak with SNF regarding pt's care. Son reports that pt had to call 911 for ambulance to bring her to hospital. " My mother shouldn't have to call for her own ambulance. Staff need to listen to my mother when she tells them she needs to go to hospital. " Concerns have been reported to SNF.   Social Worker assessment / plan:  Pt hospitalized on 01/15/16 from [facility] with Acute respiratory failure. CSW has made several attempts to speak with pt without success. Pt's son [name] [name] contacted to assist with d/c planning. Son plans  for pt to return to [facility] at d/c. Clinicals have been sent to SNF for review. SNF will admit when stable for d/c. CSW will continue to follow to assist with d/c planning to SNF.  Employment status:  Retired Nurse, adult, Medicaid In [place] PT Recommendations:  [place] / Referral to community resources:     Patient/Family's Response to  care:  Pt / family plan for pt to return to  Patient/Family's Understanding of and Emotional Response to Diagnosis, Current Treatment, and Prognosis:  Unclear if pt is aware of her medical status. Pt has been sleeping when CSW has attempted to visit.   Emotional Assessment Appearance:  Appears stated age Attitude/Demeanor/Rapport:  Unable to Assess Affect (typically observed):  Unable to Assess Orientation:  Oriented to Self, Oriented to Place, Oriented to  Time, Oriented to Situation Alcohol / Substance use:  Not Applicable Psych involvement (Current and /or in the community):  No (Comment)  Discharge Needs  Concerns to be addressed:  Discharge Planning Concerns Readmission within the last 30 days:  No Current discharge risk:  None Barriers to Discharge:  No Barriers Identified   [name] [name], [place] 01/20/2016, 1:32 PM

## 2016-01-20 NOTE — Progress Notes (Signed)
PROGRESS NOTE  [name] [name] [id number] DOB: 12/06/1945 DOA: 01/15/2016 PCP: Alvester Chou, NP  Brief Summary:   Patient is a SNF resident, she called EMS due to c/o right sided flank pain and feeling sob, she was found with confusion, venous blood gas co2 of 80. She is admitted to stepdown on bipap. Patient is more alert today and is on 2 lit of [place] oxygen.  She reports some left shoulder pain.   HPI/Recap of past 24 hours:  She reports left shoulder pain.    Assessment/Plan: Principal Problem:   Acute on chronic respiratory failure with hypoxia and hypercapnia (HCC) Active Problems:   Hypertension   Diabetes mellitus (HCC)   Renal mass   Morbid obesity (HCC)   Chronic diastolic CHF (congestive heart failure) (HCC)   OSA (obstructive sleep apnea)   COPD (chronic obstructive pulmonary disease) (HCC)   Schizoaffective disorder, bipolar type (HCC)   UTI (urinary tract infection)   Altered mental status   Metabolic encephalopathy  Acute on chronic hypercarbic/hypoxic  respiratory failure, history of COPD, suppose to be on home 02 2liter and nightly bipap, but  history of medical noncompliance with home o2 or BiPAP.  Of note, palliative care consult was actually recommended at her last discharge from [facility] in August. --VBG co2 80 on admission, she is put on bipap and admitted to stepdown, today transferred to telemetry with bipap to be used at night.  - Scheduled duonebs.  Hold spiriva.  --Incentive spirometer q1h while awake --no significant wheezing,.  Avoid steroids for now due to prednisone allergy.  Respiratory therapy.   UTI with ESBL E coli (and hypercarbia) causing acute metabolic encephalopathy (per son, patient at baseline does not have confusion, she get confused when she had uti) --patient has hypotension and leukocytosis initially, she received IV imipenem per pharmacy sicne admission for history of ESBL E coli UTI, changed to meropenam as its formulary in the  hospital.  --Urine culture shows ESBL UTI, complete the course of antibiotic by 11/30, a course of 7 days.  - her confusion is resolved.    History of diastolic CHF, normal BNP.  Mucous membranes are dry on presentation. Does has trace lower extremity edema - lasix held since admission, resumed at lower dose on 11/26 -Close monitor volume status - 2 lit of [place] Oxygen during day time.   Lower extremity edema: venous doppler negative for DVT, elevate legs  Right sided flank pain vs right lower quadrant pain, report right sided pain worse with taking deep breath Resolved.  CT renal stone on admission: No renal or ureteral stone or obstruction. There is an enlarging solid mass in the right kidney worrisome for renal cell carcinoma. Benign cysts in the kidneys. Left adrenal gland nodule consistent with adenoma. Less than 3 cm diameter right ovarian cysts is likely physiologic. Indeterminate lucent lesion in the right iliac bone is similar to prior study.  Dr Erlinda Hong explained to son over the phone, that the pain could be from uti, constipation or enlarging tumor. Will treat uti and constipation here, patient will need to follow up with Dr Armando Gang at [facility] for the enlarging right kidney tumor.  HTN --lasix and verapamil d/ed due to hypotension initially , --now bp better, continue hold verapamil ( tendency to have constipation)  - increased coreg and started on lisinopril and titrate, resume lasix at a lower dose 40mg  qd ( she is on 40mg  bid at home) - well controlled today.    Renal  mass --Known, previously biopsied.  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  SSI coverage. 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   Left shoulder pain: X RAYS show tendinitis of the rotater cuff.  Will probably need a sling.   FTT, Pt/OT, snf  DVT prophylaxis: Lovenox Code Status: DNR Family Communication:  none at bedside.  Disposition Plan: d/c pt on 11/30/ Consults called: NONE   Procedures:  nightly bipap  Antibiotics:  imipenem   Objective: BP (!) 141/94 (BP Location: Right Arm)   Pulse 78   Temp 99 F (37.2 C) (Oral)   Resp (!) 21   Ht 5\' 6"  (1.676 m)   Wt 126.9 kg (279 lb 12.2 oz)   SpO2 100%   BMI 45.16 kg/m   Intake/Output Summary (Last 24 hours) at 01/20/16 1904 Last data filed at 01/20/16 1600  Gross per 24 hour  Intake             1370 ml  Output                3 ml  Net             1367 ml   Filed Weights   01/16/16 0346 01/20/16 1811  Weight: 126 kg (277 lb 12.5 oz) 126.9 kg (279 lb 12.2 oz)    Exam:   General:  NAD, alert and comfortable.   Cardiovascular: RRR  Respiratory: diminished, no wheezing  Abdomen: Soft/ND/NT, positive BS, right lower quadrant tenderness? Very vague  Musculoskeletal: trace pitting Edema bilateral lower extremity  Neuro: alert and able to move all extremities.   Data Reviewed: Basic Metabolic Panel:  Recent Labs Lab 01/13/16 2346 01/15/16 2223 01/16/16 0526 01/17/16 0353 01/19/16 0344  NA 138 139 140 141 139  K 4.1 3.6 3.5 4.3 3.9  CL 96* 95* 98* 103 98*  CO2 34* 34* 35* 32 33*  GLUCOSE 169* 254* 183* 140* 130*  BUN 16 17 16 13 14   CREATININE 0.95 0.96 0.88 0.77 0.84  CALCIUM 9.2 9.0 8.6* 8.6* 8.9  MG  --   --   --  1.8 1.9   Liver Function Tests:  Recent Labs Lab 01/15/16 2223  AST 17  ALT 14  ALKPHOS 71  BILITOT 0.8  PROT 8.7*  ALBUMIN 3.7   No results for input(s): LIPASE, AMYLASE in the last 168 hours.  Recent Labs Lab 01/16/16 0102 01/17/16 0353  AMMONIA 42* 35   CBC:  Recent Labs Lab 01/13/16 2346  01/15/16 2223 01/16/16 0526 01/17/16 0353  WBC 11.6* 12.5* 12.7* 10.5  NEUTROABS 6.1 6.0  --   --   HGB 13.7 13.8 11.3* 12.0  HCT 42.6 43.5 36.6 39.1  MCV 89.9 91.0 91.3 91.8  PLT 238 230 219 232   Cardiac Enzymes:   No results for input(s): CKTOTAL, CKMB, CKMBINDEX, TROPONINI in the last 168 hours. BNP (last 3 results)  Recent Labs  10/12/15 2032 01/13/16 2346 01/15/16 2223  BNP 37.0 53.7 53.8    ProBNP (last 3 results) No results for input(s): PROBNP in the last 8760  hours.  CBG:  Recent Labs Lab 01/20/16 0011 01/20/16 0424 01/20/16 0709 01/20/16 1156 01/20/16 1601  GLUCAP 110* 130* 154* 190* 167*    Recent Results (from the past 240 hour(s))  Urine culture     Status: Abnormal   Collection Time: 01/15/16 10:21 PM  Result Value Ref Range Status   Specimen Description URINE, RANDOM  Final   Special Requests NONE  Final   Culture (A)  Final    >=100,000 COLONIES/mL ESCHERICHIA COLI Confirmed Extended Spectrum Beta-Lactamase Producer (ESBL) Performed at [facility]    Report Status 01/18/2016 FINAL  Final   Organism ID, Bacteria ESCHERICHIA COLI (A)  Final      Susceptibility   Escherichia coli - MIC*    AMPICILLIN >=32 RESISTANT Resistant     CEFAZOLIN >=64 RESISTANT Resistant     CEFTRIAXONE >=64 RESISTANT Resistant     CIPROFLOXACIN >=4 RESISTANT Resistant     GENTAMICIN <=1 SENSITIVE Sensitive     IMIPENEM <=0.25 SENSITIVE Sensitive     NITROFURANTOIN <=16 SENSITIVE Sensitive     TRIMETH/SULFA >=320 RESISTANT Resistant     AMPICILLIN/SULBACTAM >=32 RESISTANT Resistant     PIP/TAZO <=4 SENSITIVE Sensitive     Extended ESBL POSITIVE Resistant     * >=100,000 COLONIES/mL ESCHERICHIA COLI  MRSA PCR Screening     Status: None   Collection Time: 01/16/16  3:53 AM  Result Value Ref Range Status   MRSA by PCR NEGATIVE NEGATIVE Final    Comment:        The GeneXpert MRSA Assay (FDA approved for NASAL specimens only), is one component of  a comprehensive MRSA colonization surveillance program. It is not intended to diagnose MRSA infection nor to guide or monitor treatment for MRSA infections.      Studies: Dg Shoulder Left Port  Result Date: 01/20/2016 CLINICAL DATA:  Shoulder pain EXAM: LEFT SHOULDER - 1 VIEW COMPARISON:  08/30/2015 FINDINGS: Negative for fracture. Degenerative change in the glenohumeral joint with joint space narrowing and mild spurring. Mild to moderate degenerative change in spurring in the [facility] joint Soft tissue calcification above the humeral head is unchanged most compatible with calcific tendinitis in the rotator cuff tendon. IMPRESSION: Glenohumeral and AC degenerative change. Calcific tendinitis rotator cuff. Electronically Signed   By: Franchot Gallo M.D.   On: 01/20/2016 15:37    Scheduled Meds: . atorvastatin  20 mg Oral QHS  . benztropine  0.5 mg Oral BID  . carvedilol  25 mg Oral BID  . chlorhexidine  15 mL Mouth Rinse BID  . divalproex  1,000 mg Oral QHS  . enoxaparin (LOVENOX) injection  60 mg Subcutaneous Q24H  . famotidine  20 mg Oral BID  . fluPHENAZine  10 mg Oral Daily  . furosemide  40 mg Oral Daily  . insulin aspart  0-15 Units Subcutaneous TID WC  . insulin glargine  10 Units Subcutaneous QHS  . ipratropium-albuterol  3 mL Nebulization TID  . lisinopril  5 mg Oral Daily  . LORazepam  1 mg Intravenous Once  . mouth rinse  15 mL Mouth Rinse q12n4p  . meropenem (MERREM) IV  1 g Intravenous Q8H  . mometasone-formoterol  2 puff Inhalation BID  .  morphine injection  2 mg Intravenous Once  . polyethylene glycol  17 g Oral Daily  . senna-docusate  1 tablet Oral BID  . sodium chloride flush  3 mL Intravenous Q12H    Continuous Infusions:    Time spent: 25  mins  Ashwin Tibbs MD,   [facility] Pager [phone] If 7PM-7AM, please contact night-coverage at [email or web address], password [facility] 01/20/2016, 7:04 PM  LOS: 4 days

## 2016-01-20 NOTE — Progress Notes (Signed)
Physical Therapy Treatment Patient Details Name: [name] [name] MRN: [id number] DOB: [birth year] Today's Date: 01/20/2016    History of Present Illness 70 y.o. female admitted from SNF with acute on chronic respiratory failure with hypoxia, hypercapnia, UTI, renal mass, metabolic encephalopathy. PMH of HTN, DM, CHF, COPD, schizoaffective d/o.     PT Comments    The patient is more lethargic and is unble to assist with mobility today. Patient is on 2 liters [place]. She is complaining of severe pain with any ROM to the Left shoulder and elbow,.She has limited shoulder flexion and abduction. She reports no H/O falls. Patient reports that she has used a WC recently. Uncertain of ambulatory status PTA although Notes indicate that son reports patient ambulated with a RW PTA. Continue PT. Recommend use of maxisky for OOB based on today's assessment.  Follow Up Recommendations  SNF;Supervision/Assistance - 24 hour     Equipment Recommendations  None recommended by PT    Recommendations for Other Services       Precautions / Restrictions Precautions Precautions: Fall Precaution Comments: LUE pain with ROM    Mobility  Bed Mobility Overal bed mobility: +2 for physical assistance;+ 2 for safety/equipment             General bed mobility comments: patient is not assisting with moving legs, unable to sit upright when bed placed in chair position and tilted to facilitate upright posture. returned bed to bed position.   Transfers                    Ambulation/Gait                 Stairs            Wheelchair Mobility    Modified Rankin (Stroke Patients Only)       Balance                                    Cognition Arousal/Alertness: Lethargic   Overall Cognitive Status: No family/caregiver present to determine baseline cognitive functioning Area of Impairment: Orientation               General Comments: patietn was oriented to  hospital and Tuesday or wednesday. drifts off to snooze    Exercises      General Comments        Pertinent Vitals/Pain Pain Assessment: Faces Faces Pain Scale: Hurts whole lot Pain Location: l ue WITH SHOULDR FELXION AND ABDUCTION AND  WITH ELBOW EXTENSION .  Pain Descriptors / Indicators: Discomfort;Grimacing;Guarding;Moaning Pain Intervention(s): Limited activity within patient's tolerance    Home Living                      Prior Function            PT Goals (current goals can now be found in the care plan section) Acute Rehab PT Goals PT Goal Formulation: Patient unable to participate in goal setting Progress towards PT goals: Not progressing toward goals - comment (patient is more lethargic and rquire=ing much more assistance)    Frequency    Min 2X/week      PT Plan Current plan remains appropriate;Frequency needs to be updated    Co-evaluation             End of Session   Activity Tolerance: Patient limited by fatigue;Patient limited by lethargy Patient left:  in bed;with call bell/phone within reach;with bed alarm set     Time: [id number] PT Time Calculation (min) (ACUTE ONLY): 10 min  Charges:  $Therapeutic Activity: 8-22 mins                    G Codes:      Claretha Cooper 01/20/2016, 8:54 AM Tresa Endo PT [phone]

## 2016-01-21 DIAGNOSIS — F250 Schizoaffective disorder, bipolar type: Secondary | ICD-10-CM

## 2016-01-21 LAB — BASIC METABOLIC PANEL
Anion gap: 8 (ref 5–15)
BUN: 18 mg/dL (ref 6–20)
CHLORIDE: 97 mmol/L — AB (ref 101–111)
CO2: 34 mmol/L — ABNORMAL HIGH (ref 22–32)
CREATININE: 0.79 mg/dL (ref 0.44–1.00)
Calcium: 8.9 mg/dL (ref 8.9–10.3)
GFR calc non Af Amer: >60 mL/min (ref >60)
Glucose, Bld: 149 mg/dL — ABNORMAL HIGH (ref 65–99)
POTASSIUM: 3.7 mmol/L (ref 3.5–5.1)
SODIUM: 139 mmol/L (ref 135–145)

## 2016-01-21 LAB — GLUCOSE, CAPILLARY
GLUCOSE-CAPILLARY: 136 mg/dL — AB (ref 65–99)
GLUCOSE-CAPILLARY: 172 mg/dL — AB (ref 65–99)

## 2016-01-21 LAB — CBC
HCT: 36.8 % (ref 36.0–46.0)
HEMOGLOBIN: 11.5 g/dL — AB (ref 12.0–15.0)
MCH: 28.3 pg (ref 26.0–34.0)
MCHC: 31.3 g/dL (ref 30.0–36.0)
MCV: 90.6 fL (ref 78.0–100.0)
Platelets: 309 10*3/uL (ref 150–400)
RBC: 4.06 MIL/uL (ref 3.87–5.11)
RDW: 15.4 % (ref 11.5–15.5)
WBC: 10.5 10*3/uL (ref 4.0–10.5)

## 2016-01-21 MED ORDER — DIVALPROEX SODIUM 500 MG PO DR TAB: 1000.0000 mg | DELAYED_RELEASE_TABLET | Freq: Every day | ORAL | Status: DC

## 2016-01-21 MED ORDER — CARVEDILOL 25 MG PO TABS: 25.0000 mg | ORAL_TABLET | Freq: Two times a day (BID) | ORAL | 0 refills | Status: DC

## 2016-01-21 MED ORDER — TRAMADOL HCL 50 MG PO TABS: 50.0000 mg | ORAL_TABLET | Freq: Four times a day (QID) | ORAL | 0 refills | Status: DC | PRN

## 2016-01-21 MED ORDER — FOSFOMYCIN TROMETHAMINE 3 G PO PACK
3.0000 g | PACK | Freq: Once | ORAL | Status: AC
  Administered 2016-01-21: 3 g via ORAL
  Filled 2016-01-21: qty 3

## 2016-01-21 MED ORDER — LORAZEPAM 0.5 MG PO TABS: 0.5000 mg | ORAL_TABLET | Freq: Three times a day (TID) | ORAL | 0 refills | Status: DC | PRN

## 2016-01-21 MED ORDER — FUROSEMIDE 40 MG PO TABS: 40.0000 mg | ORAL_TABLET | Freq: Every day | ORAL | 0 refills | Status: DC

## 2016-01-21 MED ORDER — LISINOPRIL 5 MG PO TABS: 5.0000 mg | ORAL_TABLET | Freq: Every day | ORAL | 0 refills | Status: DC

## 2016-01-21 NOTE — Care Management Note (Signed)
Case Management Note  Patient Details  Name: [name] [name] MRN: [id number] Date of Birth: 10/13/1945  Subjective/Objective:                    Action/Plan:d/c SNF   Expected Discharge Date:   (UNKNOWN)               Expected Discharge Plan:  Skilled Nursing Facility  In-House Referral:  Clinical Social Work  Discharge planning Services     Post Acute Care Choice:    Choice offered to:     DME Arranged:    DME Agency:     HH Arranged:    [place] Agency:     Status of Service:  Completed, signed off  If discussed at [employer], dates discussed:    Additional Comments:  Dessa Phi, RN 01/21/2016, 3:28 PM

## 2016-01-21 NOTE — Discharge Summary (Signed)
Discharge Summary  [name] [name] [id number] DOB: 08/25/1945  PCP: Alvester Chou, NP  Admit date: 01/15/2016 Discharge date: 01/21/2016  Time spent: 25 minutes   Recommendations for Outpatient Follow-up:  1. Medication change: Coreg increased to 25 mg by mouth twice a day 2. Medication change: 40 mg Lasix decreased from twice a day to daily 3. New medication: Lisinopril 5 mg by mouth daily 4. Medication change: Verapamil being discontinued  Discharge Diagnoses:  Active Hospital Problems   Diagnosis Date Noted  . Acute on chronic respiratory failure with hypoxia and hypercapnia ([place]) 10/13/2015  . UTI (urinary tract infection) 01/16/2016  . Altered mental status 01/16/2016  . Metabolic encephalopathy   . Schizoaffective disorder, bipolar type ([place])   . OSA (obstructive sleep apnea) 10/30/2014  . COPD (chronic obstructive pulmonary disease) ([place]) 10/30/2014  . Chronic diastolic CHF (congestive heart failure) ([place]) 05/15/2014  . Morbid obesity ([place])   . Renal mass 06/26/2011  . Hypertension 06/24/2011  . Diabetes mellitus ([place]) 06/24/2011    Resolved Hospital Problems   Diagnosis Date Noted Date Resolved  No resolved problems to display.    Discharge Condition: Improved, being discharged back to skilled nursing facility   Diet recommendation: Heart healthy diet, soft  Vitals:   01/21/16 0805 01/21/16 1302  BP:  126/85  Pulse: 86 74  Resp: 18   Temp:  98.4 F (36.9 C)    History of present illness:  Patient is a 70 year old female from a skilled nursing facility with past history of diastolic heart failure, COPD and schizoaffective disorder with bipolar type admitted on 11/23 with increased confusion and found to have acute respiratory failure with hypercarbia and a large urinary tract infection. Patient initially placed in stepdown unit and started on BiPAP.  Hospital Course:  Principal Problem:   Acute on chronic respiratory failure with hypoxia and  hypercapnia [facility] Patient on baseline 2 L nasal cannula plus nightly BiPAP. Reportedly she has been at times noncompliant with home oxygen or BiPAP. On admission, PCO2 level at 80. Initially placed on continuous BiPAP and able to be eventually weaned off and transferred to telemetry. On scheduled DuoNeb's. No significant wheezing and we are able to avoid steroids due to a reported prednisone allergy. Patient now stable at baseline tolerating 2 L nasal cannula. Active Problems:   Hypertension: Lasix and verapamil initially held due to hypotension. Patient had issues with constipation so verapamil discontinued as it can cause this. Coreg increased and started on lisinopril. Patient tolerating well.   Diabetes mellitus ([place])   Renal mass: None and previously biopsied. He followed by Dr. Idolina Primer at [place] obesity [facility]): Patient meets criteria with BMI greater than 40.    Chronic diastolic CHF (congestive heart failure) ([place]): Normal BNP. Mucus vitamin Strine admission. She'll receive gentle hydration during hospitalization. Lasix held since admission. Resumed at lower dose on 11/26.   OSA (obstructive sleep apnea): See above.   COPD (chronic obstructive pulmonary disease) ([place]): Stable.    Schizoaffective disorder, bipolar type [facility]): Continue home medications.    UTI (urinary tract infection) with ESBL Escherichia coli causing acute metabolic encephalopathy: Initial hypotension and leukocytosis. Patient has been on IV imipenem initially. Following the urine culture, social and meropenem. When she was stabilized and able take by mouth, patient was able to take a dose of fosfomycin completing her full course.    Altered mental status   Metabolic encephalopathy   Procedures:  Nightly BiPAP   Consultations:  None  Discharge Exam: BP 126/85   Pulse 74   Temp 98.4 F (36.9 C)   Resp 18   Ht 5\' 6"  (1.676 m)   Wt 126.9 kg (279 lb 12.2 oz)   SpO2 100%   BMI 45.16 kg/m   General:  Alert and oriented 2, no acute distress  Cardiovascular: Regular rate and rhythm, S1-S2  Respiratory: Decreased breath sounds throughout   Discharge Instructions You were cared for by a hospitalist during your hospital stay. If you have any questions about your discharge medications or the care you received while you were in the hospital after you are discharged, you can call the unit and asked to speak with the hospitalist on call if the hospitalist that took care of you is not available. Once you are discharged, your primary care physician will handle any further medical issues. Please note that NO REFILLS for any discharge medications will be authorized once you are discharged, as it is imperative that you return to your primary care physician (or establish a relationship with a primary care physician if you do not have one) for your aftercare needs so that they can reassess your need for medications and monitor your lab values.  Discharge Instructions    Diet - low sodium heart healthy    Complete by:  As directed    Increase activity slowly    Complete by:  As directed        Medication List    STOP taking these medications   verapamil 120 MG tablet Commonly known as:  CALAN     TAKE these medications   acetaminophen 325 MG tablet Commonly known as:  TYLENOL Take 2 tablets (650 mg total) by mouth every 4 (four) hours as needed for mild pain (temp > 101.5).   albuterol 1.25 MG/3ML nebulizer solution Commonly known as:  ACCUNEB Take 3 mLs by nebulization 3 (three) times daily as needed for wheezing or shortness of breath.   atorvastatin 20 MG tablet Commonly known as:  LIPITOR Take 20 mg by mouth at bedtime.   benztropine 0.5 MG tablet Commonly known as:  COGENTIN Take 0.5 mg by mouth 2 (two) times daily. *   benztropine 1 MG tablet Commonly known as:  COGENTIN Take 1 mg by mouth daily as needed for tremors. For muscle stiffness/contracture. *Note dose*   carvedilol 25  MG tablet Commonly known as:  COREG Take 1 tablet (25 mg total) by mouth 2 (two) times daily. What changed:  medication strength  how much to take   dicyclomine 20 MG tablet Commonly known as:  BENTYL Take 1 tablet (20 mg total) by mouth 3 (three) times daily as needed for spasms.   divalproex 500 MG DR tablet Commonly known as:  DEPAKOTE Take 2 tablets (1,000 mg total) by mouth at bedtime.   feeding supplement (PRO-STAT SUGAR FREE 64) Liqd Take 30 mLs by mouth 2 (two) times daily.   fluPHENAZine 10 MG tablet Commonly known as:  PROLIXIN Take 10 mg by mouth daily.   fluPHENAZine decanoate 25 MG/ML injection Commonly known as:  PROLIXIN Inject 1 mL (25 mg total) into the muscle every 14 (fourteen) days.   Fluticasone-Salmeterol 250-50 MCG/DOSE Aepb Commonly known as:  ADVAIR Inhale 1 puff into the lungs 2 (two) times daily.   furosemide 40 MG tablet Commonly known as:  LASIX Take 1 tablet (40 mg total) by mouth daily. Start taking on:  01/22/2016 What changed:  when to take this   guaiFENesin 100  MG/5ML Soln Commonly known as:  ROBITUSSIN Take 15 mLs by mouth every 6 (six) hours as needed for cough or to loosen phlegm. *Notify physician if cough persists more than 3 days*   insulin aspart 100 UNIT/ML injection Commonly known as:  novoLOG Inject 2-10 Units into the skin 4 (four) times daily -  before meals and at bedtime. Sliding scale   insulin glargine 100 UNIT/ML injection Commonly known as:  LANTUS Inject 10 Units into the skin at bedtime.   lisinopril 5 MG tablet Commonly known as:  PRINIVIL,ZESTRIL Take 1 tablet (5 mg total) by mouth daily. Start taking on:  01/22/2016   LORazepam 0.5 MG tablet Commonly known as:  ATIVAN Take 1 tablet (0.5 mg total) by mouth every 8 (eight) hours as needed for anxiety. Pt is also able to take an additional tablet daily if needed for anxiety.   magnesium hydroxide 400 MG/5ML suspension Commonly known as:  MILK OF  MAGNESIA Take 30 mLs by mouth daily as needed for mild constipation.   metoCLOPramide 10 MG tablet Commonly known as:  REGLAN Take 1 tablet (10 mg total) by mouth every 6 (six) hours as needed for nausea or vomiting.   multivitamin with minerals Tabs tablet Take 1 tablet by mouth daily.   ranitidine 150 MG tablet Commonly known as:  ZANTAC Take 150 mg by mouth 2 (two) times daily.   tiotropium 18 MCG inhalation capsule Commonly known as:  SPIRIVA Place 1 capsule (18 mcg total) into inhaler and inhale daily.   traMADol 50 MG tablet Commonly known as:  ULTRAM Take 1 tablet (50 mg total) by mouth every 6 (six) hours as needed for moderate pain.       Allergies  Allergen Reactions  . Haldol [Haloperidol Decanoate] Swelling and Other (See Comments)    Reaction:  Swelling of tongue and blurred vision    . Metformin Diarrhea  . Prednisone Other (See Comments)    Unknown reaction  . Raspberry Swelling and Other (See Comments)    Reaction:  Swelling of lips    Follow-up Information    Markus Jarvis, NP Follow up in 1 week(s).   Specialty:  Nurse Practitioner Why:  enlarging right renal mass with right sided pain Contact information: [address] Surgery [id number] [place] [address] [phone]            The results of significant diagnostics from this hospitalization (including imaging, microbiology, ancillary and laboratory) are listed below for reference.    Significant Diagnostic Studies: Dg Chest 2 View  Result Date: 01/14/2016 CLINICAL DATA:  Acute onset of shortness of breath and left shoulder pain. Initial encounter. EXAM: CHEST  2 VIEW COMPARISON:  Chest radiograph performed 10/14/2015, and CTA of the chest performed 09/18/2015 FINDINGS: The lungs are well-aerated. Mild vascular congestion is noted. There is no evidence of focal opacification, pleural effusion or pneumothorax. The heart is borderline normal in size. No acute  osseous abnormalities are seen. IMPRESSION: Mild vascular congestion noted.  Lungs remain grossly clear. Electronically Signed   By: Garald Balding M.D.   On: 01/14/2016 00:07   Dg Chest Port 1 View  Result Date: 01/16/2016 CLINICAL DATA:  Shortness of breath.  Altered mental status. EXAM: PORTABLE CHEST 1 VIEW COMPARISON:  01/13/2016 FINDINGS: Shallow inspiration. Linear atelectasis in the left mid lung. Cardiac enlargement. No focal consolidation or edema. No blunting of costophrenic angles. No pneumothorax. Tortuous aorta. IMPRESSION: Shallow inspiration with linear atelectasis in the  left mid lung. Cardiac enlargement. Electronically Signed   By: Lucienne Capers M.D.   On: 01/16/2016 02:04   Dg Shoulder Left Port  Result Date: 01/20/2016 CLINICAL DATA:  Shoulder pain EXAM: LEFT SHOULDER - 1 VIEW COMPARISON:  08/30/2015 FINDINGS: Negative for fracture. Degenerative change in the glenohumeral joint with joint space narrowing and mild spurring. Mild to moderate degenerative change in spurring in the [facility] joint Soft tissue calcification above the humeral head is unchanged most compatible with calcific tendinitis in the rotator cuff tendon. IMPRESSION: Glenohumeral and AC degenerative change. Calcific tendinitis rotator cuff. Electronically Signed   By: Franchot Gallo M.D.   On: 01/20/2016 15:37   Ct Renal Stone Study  Result Date: 01/16/2016 CLINICAL DATA:  Right flank pain. White cell count 12.5. Red cells and white cells in the urine. History of chronic kidney disease, hypertension, diabetes. EXAM: CT ABDOMEN AND PELVIS WITHOUT CONTRAST TECHNIQUE: Multidetector CT imaging of the abdomen and pelvis was performed following the standard protocol without IV contrast. COMPARISON:  11/30/2014 FINDINGS: Lower chest: Small bilateral pleural effusions. Atelectasis in the lung bases. Cardiac enlargement. Hepatobiliary: No focal liver abnormality is seen. Status post cholecystectomy. No biliary dilatation.  Pancreas: Unremarkable. No pancreatic ductal dilatation or surrounding inflammatory changes. Spleen: Normal in size without focal abnormality. Adrenals/Urinary Tract: Left adrenal gland nodule measures 2.7 cm maximal diameter. Density measurements are 3.2 consistent with benign fat containing adenoma. No change since previous study. Non cystic mass demonstrated in the anterior right kidney measuring 3.8 cm maximal diameter. This demonstrates enlargement since previous study. Appearance is worrisome for renal cell carcinoma. Recommend surgical consultation as has been previously suggested. Bilateral renal cysts, largest on the right measuring 7.1 cm diameter. No hydronephrosis or hydroureter. No renal, ureteral, or bladder stones. Bladder is decompressed. Stomach/Bowel: Stomach, small bowel, and colon are not abnormally distended. Stool fills the colon. No apparent inflammatory changes. Appendix is not identified. Vascular/Lymphatic: Aortic atherosclerosis. Prominent lymph nodes in the groin regions bilaterally, likely reactive. No significant retroperitoneal lymphadenopathy. Reproductive: Uterus is not enlarged. Right ovarian cyst measuring 2.9 cm, likely physiologic. Other: No abdominal wall hernia or abnormality. No abdominopelvic ascites. Musculoskeletal: Lucent lesion demonstrated in the right iliac crest without change since previous study. Degenerative changes in the lumbar spine IMPRESSION: No renal or ureteral stone or obstruction. There is an enlarging solid mass in the right kidney worrisome for renal cell carcinoma. Benign cysts in the kidneys. Left adrenal gland nodule consistent with adenoma. Less than 3 cm diameter right ovarian cysts is likely physiologic. Indeterminate lucent lesion in the right iliac bone is similar to prior study. Electronically Signed   By: Lucienne Capers M.D.   On: 01/16/2016 00:03    Microbiology: Recent Results (from the past 240 hour(s))  Urine culture     Status:  Abnormal   Collection Time: 01/15/16 10:21 PM  Result Value Ref Range Status   Specimen Description URINE, RANDOM  Final   Special Requests NONE  Final   Culture (A)  Final    >=100,000 COLONIES/mL ESCHERICHIA COLI Confirmed Extended Spectrum Beta-Lactamase Producer (ESBL) Performed at [facility]    Report Status 01/18/2016 FINAL  Final   Organism ID, Bacteria ESCHERICHIA COLI (A)  Final      Susceptibility   Escherichia coli - MIC*    AMPICILLIN >=32 RESISTANT Resistant     CEFAZOLIN >=64 RESISTANT Resistant     CEFTRIAXONE >=64 RESISTANT Resistant     CIPROFLOXACIN >=4 RESISTANT Resistant  GENTAMICIN <=1 SENSITIVE Sensitive     IMIPENEM <=0.25 SENSITIVE Sensitive     NITROFURANTOIN <=16 SENSITIVE Sensitive     TRIMETH/SULFA >=320 RESISTANT Resistant     AMPICILLIN/SULBACTAM >=32 RESISTANT Resistant     PIP/TAZO <=4 SENSITIVE Sensitive     Extended ESBL POSITIVE Resistant     * >=100,000 COLONIES/mL ESCHERICHIA COLI  MRSA PCR Screening     Status: None   Collection Time: 01/16/16  3:53 AM  Result Value Ref Range Status   MRSA by PCR NEGATIVE NEGATIVE Final    Comment:        The GeneXpert MRSA Assay (FDA approved for NASAL specimens only), is one component of a comprehensive MRSA colonization surveillance program. It is not intended to diagnose MRSA infection nor to guide or monitor treatment for MRSA infections.      Labs: Basic Metabolic Panel:  Recent Labs Lab 01/15/16 2223 01/16/16 0526 01/17/16 0353 01/19/16 0344 01/21/16 0552  NA 139 140 141 139 139  K 3.6 3.5 4.3 3.9 3.7  CL 95* 98* 103 98* 97*  CO2 34* 35* 32 33* 34*  GLUCOSE 254* 183* 140* 130* 149*  BUN 17 16 13 14 18   CREATININE 0.96 0.88 0.77 0.84 0.79  CALCIUM 9.0 8.6* 8.6* 8.9 8.9  MG  --   --  1.8 1.9  --    Liver Function Tests:  Recent Labs Lab 01/15/16 2223  AST 17  ALT 14  ALKPHOS 71  BILITOT 0.8  PROT 8.7*  ALBUMIN 3.7   No results for input(s): LIPASE,  AMYLASE in the last 168 hours.  Recent Labs Lab 01/16/16 0102 01/17/16 0353  AMMONIA 42* 35   CBC:  Recent Labs Lab 01/15/16 2223 01/16/16 0526 01/17/16 0353 01/21/16 0552  WBC 12.5* 12.7* 10.5 10.5  NEUTROABS 6.0  --   --   --   HGB 13.8 11.3* 12.0 11.5*  HCT 43.5 36.6 39.1 36.8  MCV 91.0 91.3 91.8 90.6  PLT 230 219 232 309   Cardiac Enzymes: No results for input(s): CKTOTAL, CKMB, CKMBINDEX, TROPONINI in the last 168 hours. BNP: BNP (last 3 results)  Recent Labs  10/12/15 2032 01/13/16 2346 01/15/16 2223  BNP 37.0 53.7 53.8    ProBNP (last 3 results) No results for input(s): PROBNP in the last 8760 hours.  CBG:  Recent Labs Lab 01/20/16 0709 01/20/16 1156 01/20/16 1601 01/21/16 0807 01/21/16 1240  GLUCAP 154* 190* 167* 136* 172*       Signed:  Annita Brod, MD [facility] 01/21/2016, 2:26 PM

## 2016-01-21 NOTE — NC FL2 (Signed)
[place] LEVEL OF CARE SCREENING TOOL     IDENTIFICATION  Patient Name: [name] [name] Birthdate: 01-04-1946 Sex: female Admission Date (Current Location): 01/15/2016  [facility] and [place] Number:  Herbalist and Address:  [facility],  [place] [address], [place]      Provider Number: [id number]  Attending Physician Name and Address:  Annita Brod, MD  Relative Name and Phone Number:       Current Level of Care: Hospital Recommended Level of Care: [place] Prior Approval Number:    Date Approved/Denied:   PASRR Number: [id number] (expires 02/16/2016)  Discharge Plan: SNF    Current Diagnoses: Patient Active Problem List   Diagnosis Date Noted  . UTI (urinary tract infection) 01/16/2016  . Altered mental status 01/16/2016  . Metabolic encephalopathy   . Acute on chronic respiratory failure with hypoxia and hypercapnia ([place]) 10/13/2015  . Involuntary commitment 09/02/2015  . Schizoaffective disorder, bipolar type ([place])   . Urinary incontinence 10/30/2014  . GERD (gastroesophageal reflux disease) 10/30/2014  . OSA (obstructive sleep apnea) 10/30/2014  . Osteoarthrosis, unspecified whether generalized or localized, involving lower leg 10/30/2014  . COPD (chronic obstructive pulmonary disease) ([place]) 10/30/2014  . Chronic diastolic CHF (congestive heart failure) ([place]) 05/15/2014  . Morbid obesity ([place])   . History of colon polyps 03/09/2013  . Renal mass 06/26/2011  . Lytic bone lesion of hip 06/25/2011  . Hypertension 06/24/2011  . Diabetes mellitus ([place]) 06/24/2011    Orientation RESPIRATION BLADDER Height & Weight     Self, Place  Normal Incontinent Weight: 279 lb 12.2 oz (126.9 kg) Height:  5\' 6"  (167.6 cm)  BEHAVIORAL SYMPTOMS/MOOD NEUROLOGICAL BOWEL NUTRITION STATUS      Incontinent Diet (soft)  AMBULATORY STATUS COMMUNICATION OF NEEDS Skin   Extensive Assist Verbally Normal                        Personal Care Assistance Level of Assistance  Bathing, Dressing Bathing Assistance: Limited assistance   Dressing Assistance: Limited assistance     Functional Limitations Info             SPECIAL CARE FACTORS FREQUENCY                       Contractures      Additional Factors Info  Code Status, Allergies Code Status Info: DNR Allergies Info: Haldol Haloperidol Decanoate, Metformin, Prednisone, Raspberry           Current Medications (01/21/2016):  This is the current hospital active medication list Current Facility-Administered Medications  Medication Dose Route Frequency Provider Last Rate Last Dose  . acetaminophen (TYLENOL) tablet 650 mg  650 mg Oral Q6H PRN Lily Kocher, MD   650 mg at 01/17/16 1550   Or  . acetaminophen (TYLENOL) suppository 650 mg  650 mg Rectal Q6H PRN Lily Kocher, MD      . atorvastatin (LIPITOR) tablet 20 mg  20 mg Oral QHS Lily Kocher, MD   20 mg at 01/20/16 2150  . benztropine (COGENTIN) tablet 0.5 mg  0.5 mg Oral BID Lily Kocher, MD   0.5 mg at 01/20/16 2150  . carvedilol (COREG) tablet 25 mg  25 mg Oral BID Florencia Reasons, MD   25 mg at 01/20/16 2150  . chlorhexidine (PERIDEX) 0.12 % solution 15 mL  15 mL Mouth Rinse BID Lily Kocher, MD   15 mL at 01/20/16  2151  . divalproex (DEPAKOTE) DR tablet 1,000 mg  1,000 mg Oral QHS Lily Kocher, MD   1,000 mg at 01/20/16 2150  . enoxaparin (LOVENOX) injection 60 mg  60 mg Subcutaneous Q24H Thomes Lolling, RPH   60 mg at 01/20/16 0920  . famotidine (PEPCID) tablet 20 mg  20 mg Oral BID Lily Kocher, MD   20 mg at 01/20/16 2150  . fluPHENAZine (PROLIXIN) tablet 10 mg  10 mg Oral Daily Lily Kocher, MD   10 mg at 01/20/16 1003  . furosemide (LASIX) tablet 40 mg  40 mg Oral Daily Florencia Reasons, MD   40 mg at 01/20/16 0919  . insulin aspart (novoLOG) injection 0-15 Units  0-15 Units Subcutaneous TID WC Hosie Poisson, MD   2 Units at 01/21/16 0827  . insulin glargine (LANTUS) injection 10  Units  10 Units Subcutaneous QHS Lily Kocher, MD   10 Units at 01/20/16 2151  . ipratropium-albuterol (DUONEB) 0.5-2.5 (3) MG/3ML nebulizer solution 3 mL  3 mL Nebulization Q4H PRN Lily Kocher, MD      . ipratropium-albuterol (DUONEB) 0.5-2.5 (3) MG/3ML nebulizer solution 3 mL  3 mL Nebulization TID Florencia Reasons, MD   3 mL at 01/21/16 0805  . lisinopril (PRINIVIL,ZESTRIL) tablet 5 mg  5 mg Oral Daily Florencia Reasons, MD   5 mg at 01/20/16 0919  . LORazepam (ATIVAN) injection 1 mg  1 mg Intravenous Once Jeryl Columbia, NP      . LORazepam (ATIVAN) tablet 0.5 mg  0.5 mg Oral Q8H PRN Lily Kocher, MD   0.5 mg at 01/18/16 2109  . MEDLINE mouth rinse  15 mL Mouth Rinse q12n4p Lily Kocher, MD   15 mL at 01/20/16 1608  . meropenem (MERREM) 1 g in sodium chloride 0.9 % 100 mL IVPB  1 g Intravenous Q8H Anh P Pham, RPH   1 g at 01/21/16 0610  . mometasone-formoterol (DULERA) 200-5 MCG/ACT inhaler 2 puff  2 puff Inhalation BID Lily Kocher, MD   2 puff at 01/21/16 0805  . morphine 2 MG/ML injection 2 mg  2 mg Intravenous Once Gardiner Barefoot, NP      . ondansetron [facility]) tablet 4 mg  4 mg Oral Q6H PRN Lily Kocher, MD       Or  . ondansetron [facility]) injection 4 mg  4 mg Intravenous Q6H PRN Lily Kocher, MD      . polyethylene glycol (MIRALAX / GLYCOLAX) packet 17 g  17 g Oral Daily Florencia Reasons, MD   17 g at 01/20/16 1000  . senna-docusate (Senokot-S) tablet 1 tablet  1 tablet Oral BID Florencia Reasons, MD   1 tablet at 01/20/16 2154  . sodium chloride flush (NS) 0.9 % injection 3 mL  3 mL Intravenous Q12H Lily Kocher, MD   3 mL at 01/20/16 2155  . traMADol (ULTRAM) tablet 100 mg  100 mg Oral Q6H PRN Hosie Poisson, MD         Discharge Medications: Please see discharge summary for a list of discharge medications.  Relevant Imaging Results:  Relevant Lab Results:   Additional Information SSN [id number]  Standley Brooking, LCSW

## 2016-01-21 NOTE — Progress Notes (Signed)
Patient is set to discharge back to [facility] today. Patient & son, [name] made aware. Discharge packet given to RN, Robert Bellow. [facility] called for transport.     Raynaldo Opitz, [facility] Clinical Social Worker cell #: [phone]

## 2016-05-22 ENCOUNTER — Encounter (HOSPITAL_COMMUNITY): Payer: Self-pay | Admitting: Adult Health

## 2016-05-22 ENCOUNTER — Emergency Department (HOSPITAL_COMMUNITY)
Admission: EM | Admit: 2016-05-22 | Discharge: 2016-05-22 | Disposition: A | Discharge status: Home / Self Care | Payer: Medicare HMO | Attending: Emergency Medicine | Admitting: Emergency Medicine

## 2016-05-22 ENCOUNTER — Emergency Department (HOSPITAL_COMMUNITY): Payer: Medicare HMO

## 2016-05-22 DIAGNOSIS — J449 Chronic obstructive pulmonary disease, unspecified: Secondary | ICD-10-CM | POA: Insufficient documentation

## 2016-05-22 DIAGNOSIS — I2510 Atherosclerotic heart disease of native coronary artery without angina pectoris: Secondary | ICD-10-CM | POA: Insufficient documentation

## 2016-05-22 DIAGNOSIS — I5032 Chronic diastolic (congestive) heart failure: Secondary | ICD-10-CM | POA: Diagnosis not present

## 2016-05-22 DIAGNOSIS — E119 Type 2 diabetes mellitus without complications: Secondary | ICD-10-CM | POA: Diagnosis not present

## 2016-05-22 DIAGNOSIS — Z87891 Personal history of nicotine dependence: Secondary | ICD-10-CM | POA: Diagnosis not present

## 2016-05-22 DIAGNOSIS — R0789 Other chest pain: Secondary | ICD-10-CM | POA: Diagnosis present

## 2016-05-22 DIAGNOSIS — I110 Hypertensive heart disease with heart failure: Secondary | ICD-10-CM | POA: Diagnosis not present

## 2016-05-22 DIAGNOSIS — Z794 Long term (current) use of insulin: Secondary | ICD-10-CM | POA: Insufficient documentation

## 2016-05-22 DIAGNOSIS — K5900 Constipation, unspecified: Secondary | ICD-10-CM | POA: Insufficient documentation

## 2016-05-22 LAB — BASIC METABOLIC PANEL
Anion gap: 8 (ref 5–15)
BUN: 20 mg/dL (ref 6–20)
CO2: 31 mmol/L (ref 22–32)
CREATININE: 1.17 mg/dL — AB (ref 0.44–1.00)
Calcium: 9.3 mg/dL (ref 8.9–10.3)
Chloride: 100 mmol/L — ABNORMAL LOW (ref 101–111)
GFR calc non Af Amer: 46 mL/min — ABNORMAL LOW (ref >60)
GFR, EST AFRICAN AMERICAN: 53 mL/min — AB (ref >60)
GLUCOSE: 233 mg/dL — AB (ref 65–99)
Potassium: 4.5 mmol/L (ref 3.5–5.1)
Sodium: 139 mmol/L (ref 135–145)

## 2016-05-22 LAB — CBC
HEMATOCRIT: 43.5 % (ref 36.0–46.0)
Hemoglobin: 13.6 g/dL (ref 12.0–15.0)
MCH: 28.4 pg (ref 26.0–34.0)
MCHC: 31.3 g/dL (ref 30.0–36.0)
MCV: 90.8 fL (ref 78.0–100.0)
Platelets: 231 10*3/uL (ref 150–400)
RBC: 4.79 MIL/uL (ref 3.87–5.11)
RDW: 15 % (ref 11.5–15.5)
WBC: 8.4 10*3/uL (ref 4.0–10.5)

## 2016-05-22 LAB — TROPONIN I: Troponin I: <0.03 ng/mL (ref <0.03)

## 2016-05-22 NOTE — ED Triage Notes (Signed)
Presents from [facility] and rehab-pt called EMS herself stating that she began having sternal chest pressure at 8 am today-when EMS arrived staff was surprised and had no idea she had a problem-EMS gave 2 nitro and 324 of ASA inroute-pt c/o SOB in route and was incontinent of bladder. Upon arrival here pt has multiple complaints and rates her chest pressure 6/10. She also c/o  Stool stuck in her rectum a mouth sore and nausea. When asked what brought her in today she replied, "I was getting checked for AIDS and stuff and they told me I was ok"

## 2016-05-22 NOTE — ED Notes (Signed)
Patient transported to X-ray 

## 2016-05-22 NOTE — ED Provider Notes (Signed)
[facility] DEPT Provider Note   CSN: [id number] Arrival date & time: 05/22/16  1124     History   Chief Complaint Chief Complaint  Patient presents with  . Chest Pain    HPI [name] [name] is a 71 y.o. female.  HPI Patient presents to the emergency department secondary to chest pressure which began approximately 8 AM today.  She reports chest pressure continues at this time and is worse with deep breathing.  Denies shortness of breath.  No prior history of pulmonary embolism.  Patient with a history of nonobstructive coronary artery disease as well as congestive heart failure and COPD.  No recent upper respiratory infection.  Denies abdominal pain.  She does feel like she currently needs to have a large bowel movement and feels like there is stool stuck in her rectum.  She is on stool softeners.  She is on the bedside commode during my initial evaluation attempting to have a bowel movement.  She presents today from nursing facility.  No radiation of her chest pain.  No neck pain.  No back pain.  No recent swelling of her legs   Past Medical History:  Diagnosis Date  . Anxiety   . Arthritis   . Bronchitis   . Bursitis   . CAD (coronary artery disease)   . CHF (congestive heart failure) ([place])   . Chronic cough   . Chronic kidney disease    shadow on x-ray  . COPD (chronic obstructive pulmonary disease) ([place])   . Depression   . DM2 (diabetes mellitus, type 2) ([place])   . Environmental allergies   . GERD (gastroesophageal reflux disease)   . Hypercholesteremia   . Hypertension   . Left ventricular outflow tract obstruction    a. echo 03/2014: EF 60-65%, hypernamic LV systolic fxn, mod LVH w/ LVOT gradient estimated at 68 mm Hg w/ valsalva, very small LV internal cavity size, mildly increased LV posterior wall thickness, mild Ao valve scl w/o stenosis, diastolic dysfunction, normal RVSP  . Lower extremity edema   . LVH (left ventricular hypertrophy)    a. echo suggests long  standing uncontrolled htn. she will not do well when dehydrated, LV cavity obliteration  . Muscle weakness   . Obesity   . On supplemental oxygen therapy    AS NEEDED  . OSA (obstructive sleep apnea)    does not use machine  . Osteoarthritis   . Schizophrenia ([place])   . Tremors of nervous system   . Wheezing     Patient Active Problem List   Diagnosis Date Noted  . UTI (urinary tract infection) 01/16/2016  . Altered mental status 01/16/2016  . Metabolic encephalopathy   . Acute on chronic respiratory failure with hypoxia and hypercapnia ([place]) 10/13/2015  . Involuntary commitment 09/02/2015  . Schizoaffective disorder, bipolar type ([place])   . Urinary incontinence 10/30/2014  . GERD (gastroesophageal reflux disease) 10/30/2014  . OSA (obstructive sleep apnea) 10/30/2014  . Osteoarthrosis, unspecified whether generalized or localized, involving lower leg 10/30/2014  . COPD (chronic obstructive pulmonary disease) ([place]) 10/30/2014  . Chronic diastolic CHF (congestive heart failure) ([place]) 05/15/2014  . Morbid obesity ([place])   . History of colon polyps 03/09/2013  . Renal mass 06/26/2011  . Lytic bone lesion of hip 06/25/2011  . Hypertension 06/24/2011  . Diabetes mellitus ([place]) 06/24/2011    Past Surgical History:  Procedure Laterality Date  . CATARACT EXTRACTION W/PHACO Left 09/10/2014   Procedure: CATARACT EXTRACTION PHACO AND INTRAOCULAR LENS PLACEMENT (  [place]);  Surgeon: Birder Robson, MD;  Location: [facility] ORS;  Service: Ophthalmology;  Laterality: Left;  [place]: 00:44 AP%: 22.8 CDE: 10.24 Fluid lot #[id number]  . CATARACT EXTRACTION W/PHACO Right 10/15/2014   Procedure: CATARACT EXTRACTION PHACO AND INTRAOCULAR LENS PLACEMENT ([facility]);  Surgeon: Birder Robson, MD;  Location: [facility] ORS;  Service: Ophthalmology;  Laterality: Right;  [place]: 00:52 AP:40.1 CDE:11.83 LOT PACK #[id number]  . CHOLECYSTECTOMY    . COLONOSCOPY  04/2011   [facility] per patient incomplete  . EYE SURGERY    . JOINT  REPLACEMENT     TKR  . KNEE RECONSTRUCTION, MEDIAL PATELLAR FEMORAL LIGAMENT    . RENAL BIOPSY    . TONSILLECTOMY    . TONSILLECTOMY      OB History    Gravida Para Term Preterm AB Living   2         2   SAB TAB Ectopic Multiple Live Births                   Home Medications    Prior to Admission medications   Medication Sig Start Date End Date Taking? Authorizing Provider  acetaminophen (TYLENOL) 325 MG tablet Take 2 tablets (650 mg total) by mouth every 4 (four) hours as needed for mild pain (temp > 101.5). 10/20/15  Yes Gladstone Lighter, MD  albuterol (ACCUNEB) 1.25 MG/3ML nebulizer solution Take 1 ampule by nebulization every 8 (eight) hours as needed for wheezing.   Yes Historical Provider, MD  albuterol (PROVENTIL) (2.5 MG/3ML) 0.083% nebulizer solution Take 2.5 mg by nebulization at bedtime.   Yes Historical Provider, MD  atorvastatin (LIPITOR) 20 MG tablet Take 20 mg by mouth at bedtime.   Yes Historical Provider, MD  baclofen (LIORESAL) 10 MG tablet Take 10 mg by mouth 2 (two) times daily.   Yes Historical Provider, MD  benztropine (COGENTIN) 0.5 MG tablet Take 0.5 mg by mouth 2 (two) times daily.    Yes Historical Provider, MD  carvedilol (COREG) 25 MG tablet Take 1 tablet (25 mg total) by mouth 2 (two) times daily. 01/21/16  Yes Annita Brod, MD  cyclobenzaprine (FLEXERIL) 5 MG tablet Take 5 mg by mouth every 8 (eight) hours as needed for muscle spasms.   Yes Historical Provider, MD  dicyclomine (BENTYL) 20 MG tablet Take 1 tablet (20 mg total) by mouth 3 (three) times daily as needed for spasms. Patient taking differently: Take 20 mg by mouth 3 (three) times daily before meals.  11/30/14 05/22/16 Yes Orbie Pyo, MD  divalproex (DEPAKOTE) 500 MG DR tablet Take 2 tablets (1,000 mg total) by mouth at bedtime. Patient taking differently: Take 500-1,000 mg by mouth See admin instructions. Take 500 mg by mouth in the morning and take 1000 mg by mouth at bedtime  01/21/16  Yes Annita Brod, MD  fluPHENAZine (PROLIXIN) 10 MG tablet Take 10 mg by mouth daily.   Yes Historical Provider, MD  Fluticasone-Salmeterol (ADVAIR) 250-50 MCG/DOSE AEPB Inhale 1 puff into the lungs 2 (two) times daily.   Yes Historical Provider, MD  furosemide (LASIX) 40 MG tablet Take 1 tablet (40 mg total) by mouth daily. Patient taking differently: Take 60 mg by mouth 2 (two) times daily.  01/22/16  Yes Annita Brod, MD  guaiFENesin (ROBITUSSIN) 100 MG/5ML SOLN Take 15 mLs by mouth every 6 (six) hours as needed for cough or to loosen phlegm. *Notify physician if cough persists more than 3 days*   Yes Historical Provider, MD  insulin  aspart (NOVOLOG) 100 UNIT/ML injection Inject 2-10 Units into the skin 4 (four) times daily -  before meals and at bedtime. Sliding scale   Yes Historical Provider, MD  insulin glargine (LANTUS) 100 UNIT/ML injection Inject 15 Units into the skin at bedtime.    Yes Historical Provider, MD  lamoTRIgine (LAMICTAL) 25 MG tablet Take 50 mg by mouth daily.   Yes Historical Provider, MD  lisinopril (PRINIVIL,ZESTRIL) 5 MG tablet Take 1 tablet (5 mg total) by mouth daily. Patient taking differently: Take 40 mg by mouth daily.  01/22/16  Yes Annita Brod, MD  magnesium hydroxide (MILK OF MAGNESIA) 400 MG/5ML suspension Take 30 mLs by mouth daily as needed for mild constipation.   Yes Historical Provider, MD  meloxicam (MOBIC) 7.5 MG tablet Take 7.5 mg by mouth daily.   Yes Historical Provider, MD  metoCLOPramide (REGLAN) 10 MG tablet Take 1 tablet (10 mg total) by mouth every 6 (six) hours as needed for nausea or vomiting. 11/30/14  Yes Orbie Pyo, MD  Multiple Vitamin (MULTIVITAMIN WITH MINERALS) TABS tablet Take 1 tablet by mouth daily.   Yes Historical Provider, MD  nitroGLYCERIN (NITROSTAT) 0.4 MG SL tablet Place 0.4 mg under the tongue every 5 (five) minutes as needed for chest pain.   Yes Historical Provider, MD  potassium chloride  SA (K-DUR,KLOR-CON) 20 MEQ tablet Take 20 mEq by mouth daily.   Yes Historical Provider, MD  ranitidine (ZANTAC) 150 MG tablet Take 150 mg by mouth 2 (two) times daily.   Yes Historical Provider, MD  Skin Protectants, Misc. (EUCERIN) cream Apply 1 application topically daily.   Yes Historical Provider, MD  tiotropium (SPIRIVA) 18 MCG inhalation capsule Place 1 capsule (18 mcg total) into inhaler and inhale daily. 07/02/14  Yes [name], MD  tiZANidine (ZANAFLEX) 2 MG tablet Take 2 mg by mouth every 8 (eight) hours as needed for muscle spasms.   Yes Historical Provider, MD  traMADol (ULTRAM) 50 MG tablet Take 1 tablet (50 mg total) by mouth every 6 (six) hours as needed for moderate pain. 01/21/16  Yes Annita Brod, MD  fluPHENAZine decanoate (PROLIXIN) 25 MG/ML injection Inject 1 mL (25 mg total) into the muscle every 14 (fourteen) days. Patient not taking: Reported on 05/22/2016 11/14/14   Hildred Priest, MD  LORazepam (ATIVAN) 0.5 MG tablet Take 1 tablet (0.5 mg total) by mouth every 8 (eight) hours as needed for anxiety. Pt is also able to take an additional tablet daily if needed for anxiety. Patient not taking: Reported on 05/22/2016 01/21/16   Annita Brod, MD    Family History Family History  Problem Relation Age of Onset  . Heart attack Mother   . Colon cancer Neg Hx   . Liver disease Neg Hx     Social History Social History  Substance Use Topics  . Smoking status: Former Smoker    Packs/day: 3.00    Years: 40.00  . Smokeless tobacco: Never Used     Comment: quit in 2009  . Alcohol use No     Comment: occ.     Allergies   Haldol [haloperidol decanoate]; Metformin; Prednisone; and Raspberry   Review of Systems Review of Systems  All other systems reviewed and are negative.    Physical Exam Updated Vital Signs BP (!) 166/90 (BP Location: Right Arm)   Pulse 82   Temp 98.7 F (37.1 C) (Oral)   Resp 17   SpO2 97%   Physical Exam  Constitutional: She is oriented to person, place, and time. She appears well-developed and well-nourished. No distress.  HENT:  Head: Normocephalic and atraumatic.  Eyes: EOM are normal.  Neck: Normal range of motion.  Cardiovascular: Normal rate, regular rhythm and normal heart sounds.   Pulmonary/Chest: Effort normal and breath sounds normal.  Abdominal: Soft. She exhibits no distension. There is no tenderness.  Genitourinary:  Genitourinary Comments: Chaperone present.  Large-volume soft brown stool noted on rectal examination  Musculoskeletal: Normal range of motion.  Neurological: She is alert and oriented to person, place, and time.  Skin: Skin is warm and dry.  Psychiatric: She has a normal mood and affect. Judgment normal.  Nursing note and vitals reviewed.    ED Treatments / Results  Labs (all labs ordered are listed, but only abnormal results are displayed) Labs Reviewed  CBC  BASIC METABOLIC PANEL  TROPONIN I    EKG  EKG Interpretation  Date/Time:  Saturday May 22 2016 11:34:35 EDT Ventricular Rate:  97 PR Interval:    QRS Duration: 80 QT Interval:  357 QTC Calculation: 454 R Axis:   38 Text Interpretation:  Sinus rhythm Atrial premature complex Short PR interval Nonspecific T abnormalities, lateral leads Baseline wander in lead(s) V3 No significant change was found Confirmed by Diar Berkel  MD, Whitnie Deleon ([id number]) on 05/22/2016 11:42:04 AM       Radiology No results found.  Procedures Procedures (including critical care time)  Medications Ordered in ED Medications - No data to display   Initial Impression / Assessment and Plan / ED Course  I have reviewed the triage vital signs and the nursing notes.  Pertinent labs & imaging results that were available during my care of the patient were reviewed by me and considered in my medical decision making (see chart for details).     Atypical chest pain.  Troponin negative.  EKG without ischemic changes.  Low  suspicion for ACS.  Doubt PE.  Likely musculoskeletal related chest pain.  She also presents with large stool burden.  She was able to defecate large amount of stool here at the bedside after rectal examination.  Discharge home in good condition.  Primary care follow-up.  She understands to return to the ER for new or worsening symptoms.  Final Clinical Impressions(s) / ED Diagnoses   Final diagnoses:  Atypical chest pain  Constipation, unspecified constipation type    New Prescriptions New Prescriptions   No medications on file     Jola Schmidt, MD 05/22/16 1341

## 2016-05-22 NOTE — ED Notes (Signed)
EKG given to Dr. Campos 

## 2016-06-06 IMAGING — CT CT HEAD W/O CM
4 of 7 series · 16 of 30 positions shown, 17 images · non-contrast
Comparison: 04/21/2014

CLINICAL DATA: fell out of bed this morning and found on floor. Pt
with neck and head pain, pt with abrasion on forehead.

EXAM:
CT HEAD WITHOUT CONTRAST
CT CERVICAL SPINE WITHOUT CONTRAST
TECHNIQUE: Multidetector CT imaging of the head and cervical spine was
performed following the standard protocol without intravenous
contrast. Multiplanar CT image reconstructions of the cervical spine
were also generated.

[Series 3: bone · axial · 0.42mm/px · z∈[-81,+15]mm · 4 of 80 slices shown]
[im 16/80  bone]
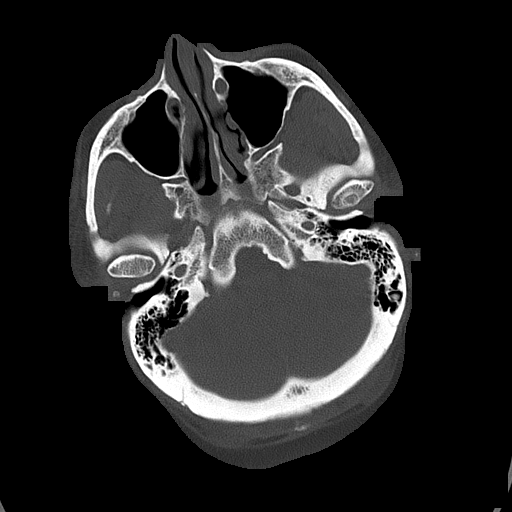
[im 32/80  bone]
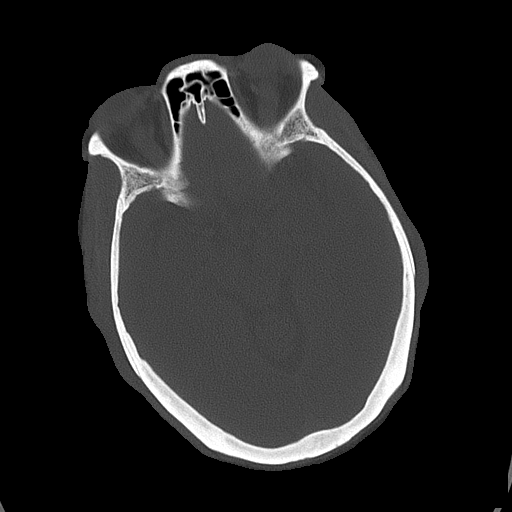
[im 48/80  bone]
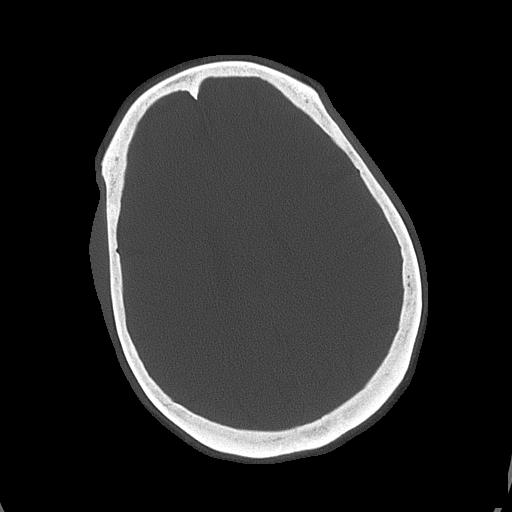
[im 64/80  bone]
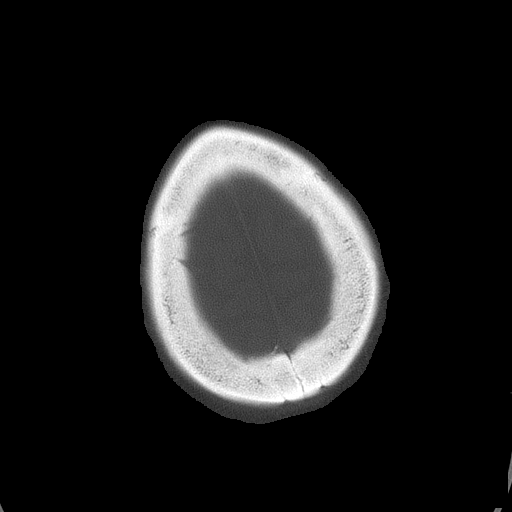

[Series 6: c spine soft · axial · 0.30mm/px · z∈[-198,-108]mm · 4 of 75 slices shown]
[im 15/75  brain]
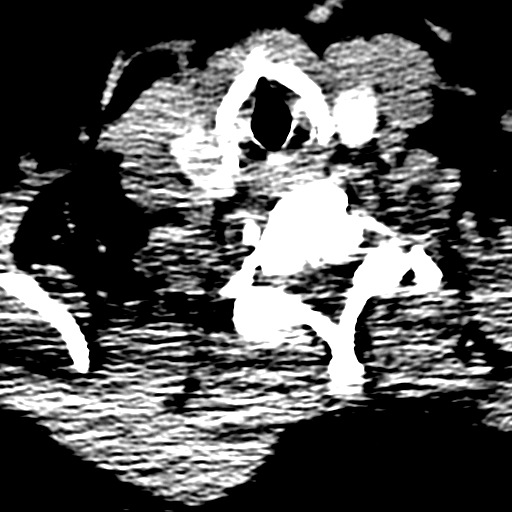
[im 30/75  brain]
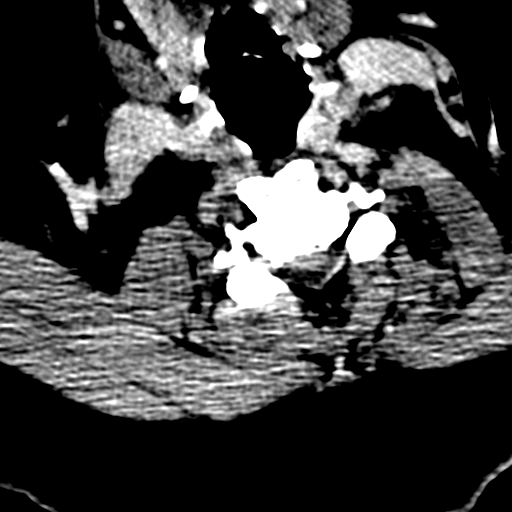
[im 45/75  brain]
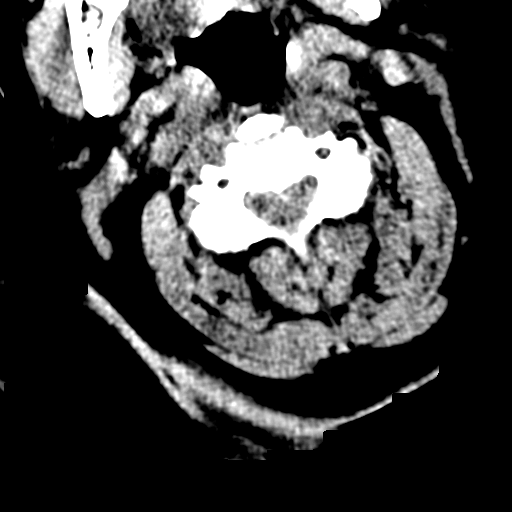
[im 60/75  brain]
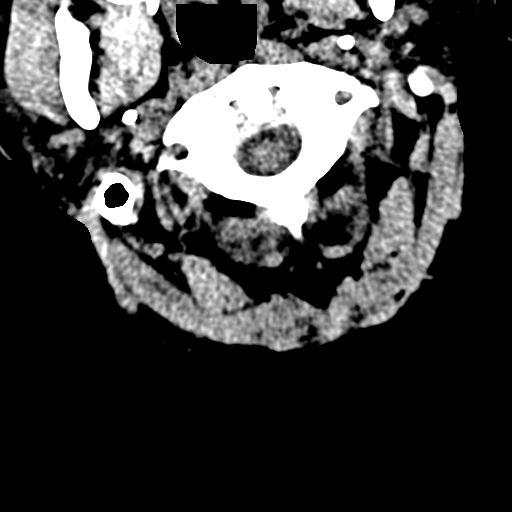

[Series 10: bone recon · axial · 0.42mm/px · z∈[-25,+53]mm · 4 of 69 slices shown]
[im 14/69  bone]
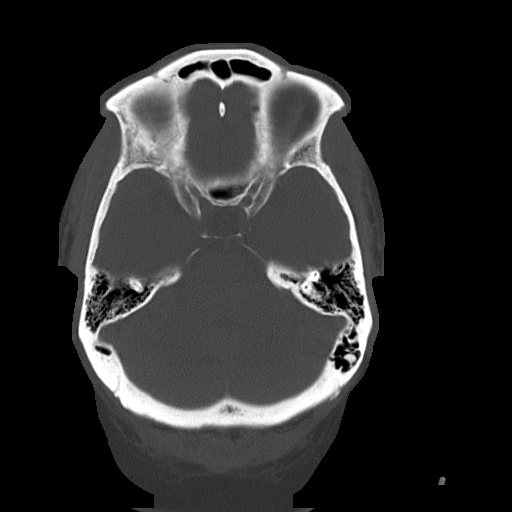
[im 28/69  bone]
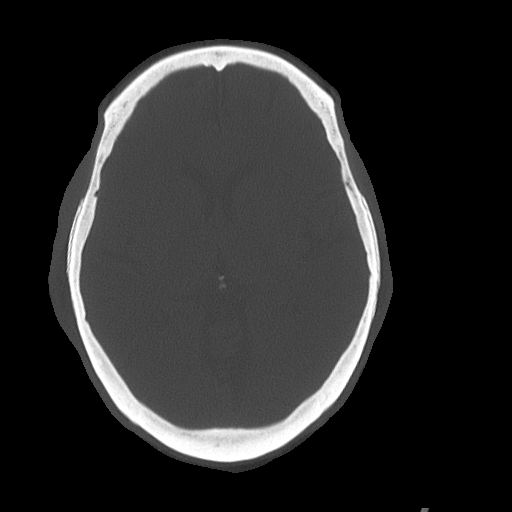
[im 41/69  bone]
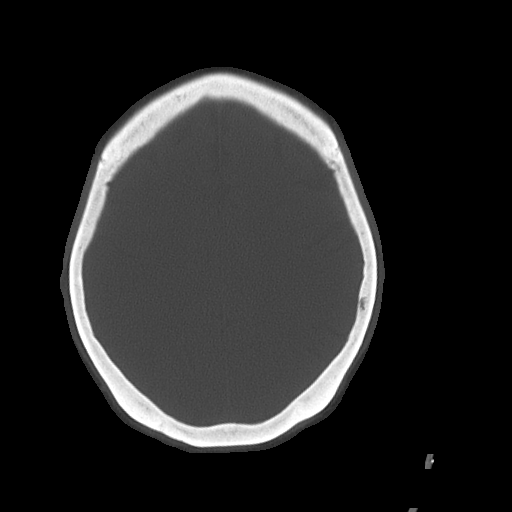
[im 55/69  bone]
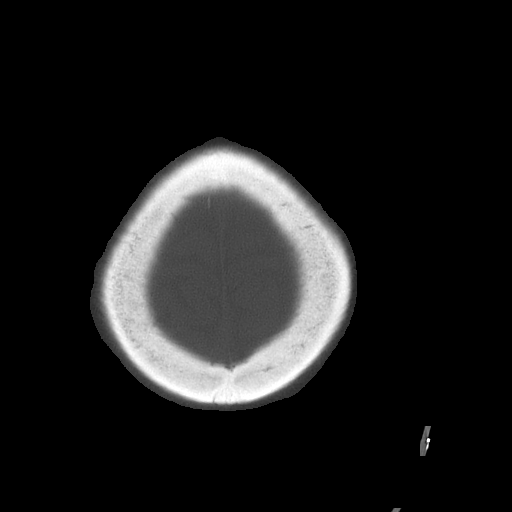

[Series 13: orthogonal axials · axial · 0.17mm/px · z∈[-207,-124]mm · 4 of 76 slices shown, 5 images]
[im 16/76  brain]
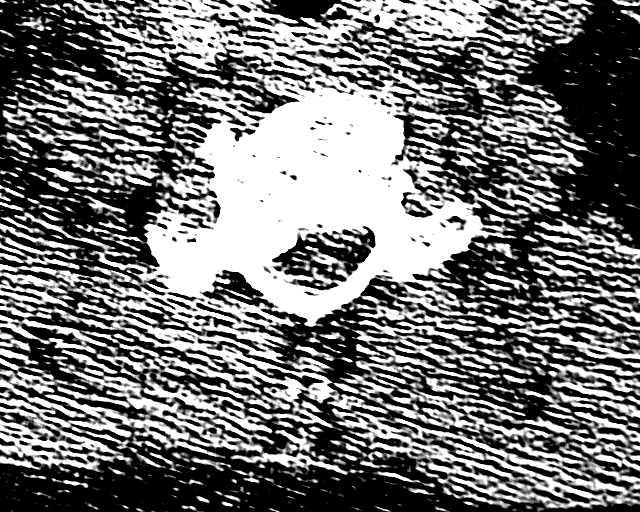
[im 16/76  bone]
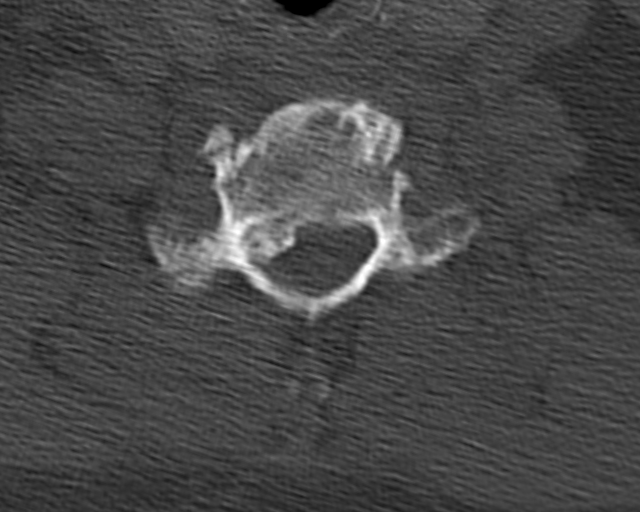
[im 31/76  brain]
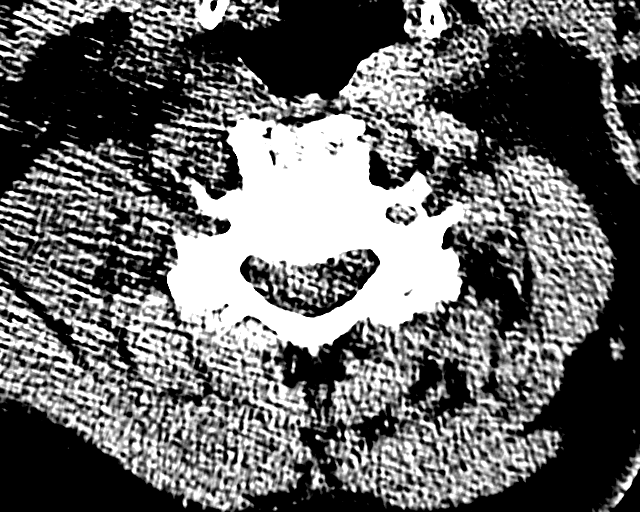
[im 46/76  brain]
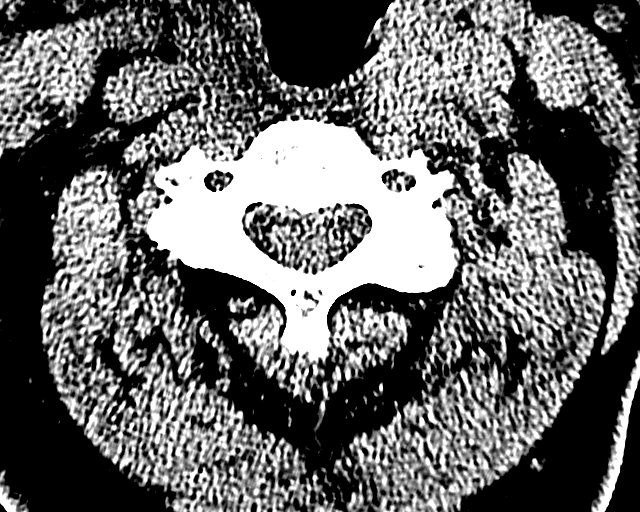
[im 61/76  brain]
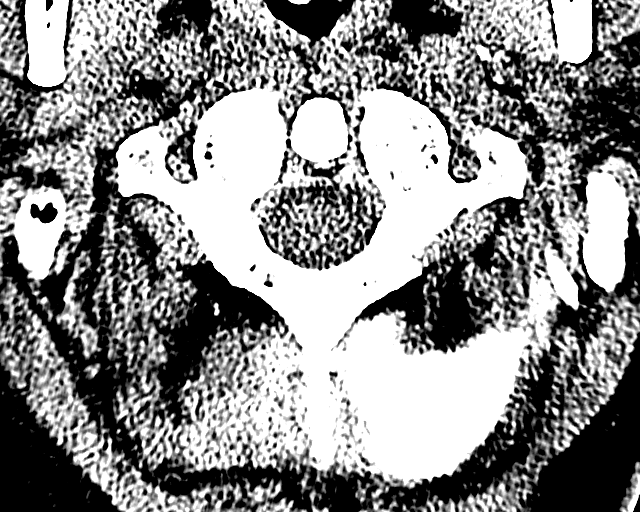

[16 of 30 positions shown; findings below may reference images not displayed]

FINDINGS: CT HEAD FINDINGS

Small retention cysts or polyps in the visualized right maxillary
sinus . Atherosclerotic and physiologic intracranial calcifications.
Mild atrophy. There is no evidence of acute intracranial hemorrhage,
brain edema, mass lesion, acute infarction, mass effect, or midline
shift. Acute infarct may be inapparent on noncontrast CT. No other
intra-axial abnormalities are seen, and the ventricles and sulci are
within normal limits in size and symmetry. No abnormal extra-axial
fluid collections or masses are identified. No significant calvarial
abnormality.

CT CERVICAL SPINE FINDINGS

Reversal of the normal cervical lordosis. There is mild narrowing of
interspaces C4-5 and C6-7, moderate narrowing C5-6. Calcified right
foraminal protrusion right C6-7. Posterior endplate spurring at all
3 levels. Facets are seated. No prevertebral soft tissue swelling.
Negative for fracture. Uncovertebral spurring results in foraminal
encroachment right C6-7, bilaterally C4-5 and C5-6. Visualized lung
apices clear. 16 mm low-attenuation left thyroid lesion.
IMPRESSION: 1. Negative for bleed or other acute intracranial process.
2. Negative for cervical fracture or other acute bone abnormality.
3. Cervical spondylitic changes most marked C4-C7 as detailed above.
4. Loss of the normal cervical spine lordosis, which may be
secondary to positioning, spasm, or soft tissue injury.
5. Left thyroid nodule.

## 2016-06-19 ENCOUNTER — Emergency Department: Payer: Medicare HMO

## 2016-06-19 ENCOUNTER — Encounter: Payer: Self-pay | Admitting: Emergency Medicine

## 2016-06-19 ENCOUNTER — Emergency Department
Admission: EM | Admit: 2016-06-19 | Discharge: 2016-06-19 | Disposition: A | Discharge status: Home / Self Care | Payer: Medicare HMO | Attending: Emergency Medicine | Admitting: Emergency Medicine

## 2016-06-19 DIAGNOSIS — I5032 Chronic diastolic (congestive) heart failure: Secondary | ICD-10-CM | POA: Diagnosis not present

## 2016-06-19 DIAGNOSIS — Z79899 Other long term (current) drug therapy: Secondary | ICD-10-CM | POA: Diagnosis not present

## 2016-06-19 DIAGNOSIS — R079 Chest pain, unspecified: Secondary | ICD-10-CM | POA: Diagnosis present

## 2016-06-19 DIAGNOSIS — E119 Type 2 diabetes mellitus without complications: Secondary | ICD-10-CM | POA: Diagnosis not present

## 2016-06-19 DIAGNOSIS — J449 Chronic obstructive pulmonary disease, unspecified: Secondary | ICD-10-CM | POA: Insufficient documentation

## 2016-06-19 DIAGNOSIS — R0602 Shortness of breath: Secondary | ICD-10-CM | POA: Insufficient documentation

## 2016-06-19 DIAGNOSIS — R600 Localized edema: Secondary | ICD-10-CM

## 2016-06-19 DIAGNOSIS — Z794 Long term (current) use of insulin: Secondary | ICD-10-CM | POA: Diagnosis not present

## 2016-06-19 DIAGNOSIS — I110 Hypertensive heart disease with heart failure: Secondary | ICD-10-CM | POA: Diagnosis not present

## 2016-06-19 DIAGNOSIS — R0600 Dyspnea, unspecified: Secondary | ICD-10-CM

## 2016-06-19 DIAGNOSIS — Z87891 Personal history of nicotine dependence: Secondary | ICD-10-CM | POA: Insufficient documentation

## 2016-06-19 DIAGNOSIS — I2510 Atherosclerotic heart disease of native coronary artery without angina pectoris: Secondary | ICD-10-CM | POA: Insufficient documentation

## 2016-06-19 LAB — BRAIN NATRIURETIC PEPTIDE: B Natriuretic Peptide: 38 pg/mL (ref 0.0–100.0)

## 2016-06-19 LAB — CBC
HCT: 40.9 % (ref 35.0–47.0)
HEMOGLOBIN: 13.4 g/dL (ref 12.0–16.0)
MCH: 28.7 pg (ref 26.0–34.0)
MCHC: 32.8 g/dL (ref 32.0–36.0)
MCV: 87.5 fL (ref 80.0–100.0)
PLATELETS: 240 10*3/uL (ref 150–440)
RBC: 4.67 MIL/uL (ref 3.80–5.20)
RDW: 14.6 % — ABNORMAL HIGH (ref 11.5–14.5)
WBC: 10 10*3/uL (ref 3.6–11.0)

## 2016-06-19 LAB — BASIC METABOLIC PANEL
ANION GAP: 8 (ref 5–15)
BUN: 23 mg/dL — ABNORMAL HIGH (ref 6–20)
CHLORIDE: 99 mmol/L — AB (ref 101–111)
CO2: 32 mmol/L (ref 22–32)
CREATININE: 1.23 mg/dL — AB (ref 0.44–1.00)
Calcium: 9.3 mg/dL (ref 8.9–10.3)
GFR calc Af Amer: 50 mL/min — ABNORMAL LOW (ref >60)
GFR calc non Af Amer: 43 mL/min — ABNORMAL LOW (ref >60)
Glucose, Bld: 261 mg/dL — ABNORMAL HIGH (ref 65–99)
POTASSIUM: 4.4 mmol/L (ref 3.5–5.1)
Sodium: 139 mmol/L (ref 135–145)

## 2016-06-19 LAB — TROPONIN I: Troponin I: <0.03 ng/mL (ref <0.03)

## 2016-06-19 MED ORDER — IOPAMIDOL (ISOVUE-370) INJECTION 76%
75.0000 mL | Freq: Once | INTRAVENOUS | Status: AC | PRN
Start: 2016-06-19 — End: 2016-06-19
  Administered 2016-06-19: 75 mL via INTRAVENOUS

## 2016-06-19 NOTE — ED Notes (Signed)
Patient transported to CT 

## 2016-06-19 NOTE — ED Notes (Signed)
RN also called pts son at pts request to update on pts discharge and transport back to facility. Both Olivia Mackie, RN at [employer], pts son have confirmed pt is to be transported via EMS.

## 2016-06-19 NOTE — ED Notes (Signed)
Pt transferred to Xray by xray tech

## 2016-06-19 NOTE — Discharge Instructions (Signed)
You were evaluated for chest discomfort, and although no certain cause was found, your exam and evaluation are reassuring in the emergency department stay.  Please increase her dose of Lasix to 60 mg twice daily for just 2 days and then go back to your current 40 mg twice daily dosing to help with lower extremity edema.  Return to the emergency department immediately for any worsening chest pain, trouble breathing, nausea, sweats, weakness, numbness, fever, confusion, altered mental status, or any other symptoms concerning to you.

## 2016-06-19 NOTE — ED Notes (Signed)
PT left ED at 1800. Unable to document discharge at that time.

## 2016-06-19 NOTE — ED Provider Notes (Addendum)
[facility] Emergency Department Provider Note ____________________________________________   I have reviewed the triage vital signs and the triage nursing note.  HISTORY  Chief Complaint Chest Pain and Shortness of Breath   Historian Patient and her son  HPI [name] [name] is a 71 y.o. female lives at a nursing home, presents today complaining of shortness of breath and chest pain when she woke up this morning. Located centrally. Onset around 5 AM. No nausea or vomiting. No radiation. Feels a little bit better now, but still there. She reports worsening leg swelling. Reports a history of CHF. She thinks her Lasix is not working.  She states that she does not want to stay at the nursing home anymore and wants her son to take her back home. The son states that she needs someone to take care of her, and he is not capable of it. Patient states that she would rather be released to live on the streets.  States that her moods are overall doing well. Dip denies depression or anxiety or suicidal thoughts.  Tells me she hasn't seen her regular doctor in about a year.  Diagnoses list includes schizoaffective disorder.  I was able to speak with the nursing home staff that can confirm that she is taking 40 mg twice daily of Lasix.   Past Medical History:  Diagnosis Date  . Anxiety   . Arthritis   . Bronchitis   . Bursitis   . CAD (coronary artery disease)   . CHF (congestive heart failure) ([place])   . Chronic cough   . Chronic kidney disease    shadow on x-ray  . COPD (chronic obstructive pulmonary disease) ([place])   . Depression   . DM2 (diabetes mellitus, type 2) ([place])   . Environmental allergies   . GERD (gastroesophageal reflux disease)   . Hypercholesteremia   . Hypertension   . Left ventricular outflow tract obstruction    a. echo 03/2014: EF 60-65%, hypernamic LV systolic fxn, mod LVH w/ LVOT gradient estimated at 68 mm Hg w/ valsalva, very small LV internal  cavity size, mildly increased LV posterior wall thickness, mild Ao valve scl w/o stenosis, diastolic dysfunction, normal RVSP  . Lower extremity edema   . LVH (left ventricular hypertrophy)    a. echo suggests long standing uncontrolled htn. she will not do well when dehydrated, LV cavity obliteration  . Muscle weakness   . Obesity   . On supplemental oxygen therapy    AS NEEDED  . OSA (obstructive sleep apnea)    does not use machine  . Osteoarthritis   . Schizophrenia ([place])   . Tremors of nervous system   . Wheezing     Patient Active Problem List   Diagnosis Date Noted  . UTI (urinary tract infection) 01/16/2016  . Altered mental status 01/16/2016  . Metabolic encephalopathy   . Acute on chronic respiratory failure with hypoxia and hypercapnia ([place]) 10/13/2015  . Involuntary commitment 09/02/2015  . Schizoaffective disorder, bipolar type ([place])   . Urinary incontinence 10/30/2014  . GERD (gastroesophageal reflux disease) 10/30/2014  . OSA (obstructive sleep apnea) 10/30/2014  . Osteoarthrosis, unspecified whether generalized or localized, involving lower leg 10/30/2014  . COPD (chronic obstructive pulmonary disease) ([place]) 10/30/2014  . Chronic diastolic CHF (congestive heart failure) ([place]) 05/15/2014  . Morbid obesity ([place])   . History of colon polyps 03/09/2013  . Renal mass 06/26/2011  . Lytic bone lesion of hip 06/25/2011  . Hypertension 06/24/2011  .  Diabetes mellitus ([place]) 06/24/2011    Past Surgical History:  Procedure Laterality Date  . CATARACT EXTRACTION W/PHACO Left 09/10/2014   Procedure: CATARACT EXTRACTION PHACO AND INTRAOCULAR LENS PLACEMENT (IOC);  Surgeon: Birder Robson, MD;  Location: [facility] ORS;  Service: Ophthalmology;  Laterality: Left;  [place]: 00:44 AP%: 22.8 CDE: 10.24 Fluid lot #[id number]  . CATARACT EXTRACTION W/PHACO Right 10/15/2014   Procedure: CATARACT EXTRACTION PHACO AND INTRAOCULAR LENS PLACEMENT (IOC);  Surgeon: Birder Robson, MD;   Location: [facility] ORS;  Service: Ophthalmology;  Laterality: Right;  [place]: 00:52 AP:40.1 CDE:11.83 LOT PACK #[id number]  . CHOLECYSTECTOMY    . COLONOSCOPY  04/2011   [facility] per patient incomplete  . EYE SURGERY    . JOINT REPLACEMENT     TKR  . KNEE RECONSTRUCTION, MEDIAL PATELLAR FEMORAL LIGAMENT    . RENAL BIOPSY    . TONSILLECTOMY    . TONSILLECTOMY      Prior to Admission medications   Medication Sig Start Date End Date Taking? Authorizing Provider  acetaminophen (TYLENOL) 325 MG tablet Take 2 tablets (650 mg total) by mouth every 4 (four) hours as needed for mild pain (temp > 101.5). 10/20/15   Gladstone Lighter, MD  albuterol (ACCUNEB) 1.25 MG/3ML nebulizer solution Take 1 ampule by nebulization every 8 (eight) hours as needed for wheezing.    Historical Provider, MD  albuterol (PROVENTIL) (2.5 MG/3ML) 0.083% nebulizer solution Take 2.5 mg by nebulization at bedtime.    Historical Provider, MD  atorvastatin (LIPITOR) 20 MG tablet Take 20 mg by mouth at bedtime.    Historical Provider, MD  baclofen (LIORESAL) 10 MG tablet Take 10 mg by mouth 2 (two) times daily.    Historical Provider, MD  benztropine (COGENTIN) 0.5 MG tablet Take 0.5 mg by mouth 2 (two) times daily.     Historical Provider, MD  carvedilol (COREG) 25 MG tablet Take 1 tablet (25 mg total) by mouth 2 (two) times daily. 01/21/16   Annita Brod, MD  cyclobenzaprine (FLEXERIL) 5 MG tablet Take 5 mg by mouth every 8 (eight) hours as needed for muscle spasms.    Historical Provider, MD  dicyclomine (BENTYL) 20 MG tablet Take 1 tablet (20 mg total) by mouth 3 (three) times daily as needed for spasms. Patient taking differently: Take 20 mg by mouth 3 (three) times daily before meals.  11/30/14 05/22/16  Orbie Pyo, MD  divalproex (DEPAKOTE) 500 MG DR tablet Take 2 tablets (1,000 mg total) by mouth at bedtime. Patient taking differently: Take 500-1,000 mg by mouth See admin instructions. Take 500 mg by mouth in the  morning and take 1000 mg by mouth at bedtime 01/21/16   Annita Brod, MD  fluPHENAZine (PROLIXIN) 10 MG tablet Take 10 mg by mouth daily.    Historical Provider, MD  fluPHENAZine decanoate (PROLIXIN) 25 MG/ML injection Inject 1 mL (25 mg total) into the muscle every 14 (fourteen) days. Patient not taking: Reported on 05/22/2016 11/14/14   Hildred Priest, MD  Fluticasone-Salmeterol (ADVAIR) 250-50 MCG/DOSE AEPB Inhale 1 puff into the lungs 2 (two) times daily.    Historical Provider, MD  furosemide (LASIX) 40 MG tablet Take 1 tablet (40 mg total) by mouth daily. Patient taking differently: Take 60 mg by mouth 2 (two) times daily.  01/22/16   Annita Brod, MD  guaiFENesin (ROBITUSSIN) 100 MG/5ML SOLN Take 15 mLs by mouth every 6 (six) hours as needed for cough or to loosen phlegm. *Notify physician if cough persists more than 3  days*    Historical Provider, MD  insulin aspart (NOVOLOG) 100 UNIT/ML injection Inject 2-10 Units into the skin 4 (four) times daily -  before meals and at bedtime. Sliding scale    Historical Provider, MD  insulin glargine (LANTUS) 100 UNIT/ML injection Inject 15 Units into the skin at bedtime.     Historical Provider, MD  lamoTRIgine (LAMICTAL) 25 MG tablet Take 50 mg by mouth daily.    Historical Provider, MD  lisinopril (PRINIVIL,ZESTRIL) 5 MG tablet Take 1 tablet (5 mg total) by mouth daily. Patient taking differently: Take 40 mg by mouth daily.  01/22/16   Annita Brod, MD  LORazepam (ATIVAN) 0.5 MG tablet Take 1 tablet (0.5 mg total) by mouth every 8 (eight) hours as needed for anxiety. Pt is also able to take an additional tablet daily if needed for anxiety. Patient not taking: Reported on 05/22/2016 01/21/16   Annita Brod, MD  magnesium hydroxide (MILK OF MAGNESIA) 400 MG/5ML suspension Take 30 mLs by mouth daily as needed for mild constipation.    Historical Provider, MD  meloxicam (MOBIC) 7.5 MG tablet Take 7.5 mg by mouth daily.     Historical Provider, MD  metoCLOPramide (REGLAN) 10 MG tablet Take 1 tablet (10 mg total) by mouth every 6 (six) hours as needed for nausea or vomiting. 11/30/14   Orbie Pyo, MD  Multiple Vitamin (MULTIVITAMIN WITH MINERALS) TABS tablet Take 1 tablet by mouth daily.    Historical Provider, MD  nitroGLYCERIN (NITROSTAT) 0.4 MG SL tablet Place 0.4 mg under the tongue every 5 (five) minutes as needed for chest pain.    Historical Provider, MD  potassium chloride SA (K-DUR,KLOR-CON) 20 MEQ tablet Take 20 mEq by mouth daily.    Historical Provider, MD  ranitidine (ZANTAC) 150 MG tablet Take 150 mg by mouth 2 (two) times daily.    Historical Provider, MD  Skin Protectants, Misc. (EUCERIN) cream Apply 1 application topically daily.    Historical Provider, MD  tiotropium (SPIRIVA) 18 MCG inhalation capsule Place 1 capsule (18 mcg total) into inhaler and inhale daily. 07/02/14   [name], MD  tiZANidine (ZANAFLEX) 2 MG tablet Take 2 mg by mouth every 8 (eight) hours as needed for muscle spasms.    Historical Provider, MD  traMADol (ULTRAM) 50 MG tablet Take 1 tablet (50 mg total) by mouth every 6 (six) hours as needed for moderate pain. 01/21/16   Annita Brod, MD    Allergies  Allergen Reactions  . Haldol [Haloperidol Decanoate] Swelling and Other (See Comments)    Reaction:  Swelling of tongue and blurred vision    . Metformin Diarrhea  . Prednisone Other (See Comments)    Unknown reaction  . Raspberry Swelling and Other (See Comments)    Reaction:  Swelling of lips     Family History  Problem Relation Age of Onset  . Heart attack Mother   . Colon cancer Neg Hx   . Liver disease Neg Hx     Social History Social History  Substance Use Topics  . Smoking status: Former Smoker    Packs/day: 3.00    Years: 40.00  . Smokeless tobacco: Never Used     Comment: quit in 2009  . Alcohol use No     Comment: occ.    Review of Systems  Constitutional: Negative for  fever. Eyes: Negative for visual changes. ENT: Negative for sore throat. Cardiovascular: Positive for chest pain. Respiratory: Positive for shortness of  breath.  No productive cough. Gastrointestinal: Negative for abdominal pain, vomiting and diarrhea. Genitourinary: Negative for dysuria. Musculoskeletal: Negative for back pain. Skin: Negative for rash. Neurological: Negative for headache. 10 point Review of Systems otherwise negative ____________________________________________   PHYSICAL EXAM:  VITAL SIGNS: ED Triage Vitals  Enc Vitals Group     BP 06/19/16 0926 (!) 157/71     Pulse Rate 06/19/16 0924 81     Resp 06/19/16 0924 17     Temp 06/19/16 0917 98 F (36.7 C)     Temp Source 06/19/16 0917 Oral     SpO2 06/19/16 0913 97 %     Weight 06/19/16 0917 287 lb 12.8 oz (130.5 kg)     Height --      Head Circumference --      Peak Flow --      Pain Score 06/19/16 0916 8     Pain Loc --      Pain Edu? --      Excl. in Force? --      Constitutional: Alert and Cooperative. Well appearing and in no distress. HEENT   Head: Normocephalic and atraumatic.      Eyes: Conjunctivae are normal. PERRL. Normal extraocular movements.      Ears:         Nose: No congestion/rhinnorhea.  Wearing home 2 L nasal cannula.   Mouth/Throat: Mucous membranes are moist.   Neck: No stridor. Cardiovascular/Chest: Normal rate, regular rhythm.  No murmurs, rubs, or gallops. Respiratory: Normal respiratory effort without tachypnea nor retractions. Decreased breath sounds throughout, but no wheezing rales or rhonchi. Gastrointestinal: Soft. No distention, no guarding, no rebound. Nontender.  Orbit obesity  Genitourinary/rectal:Deferred Musculoskeletal: Nontender with normal range of motion in all extremities. No joint effusions.  No lower extremity tenderness.  2+ lower extremity edema bilateral lower extremity. Neurologic:  Normal speech and language. No gross or focal neurologic deficits  are appreciated. Skin:  Skin is warm, dry and intact. No rash noted. Psychiatric: No hallucinations. At times gets a little agitated and that is redirectable. Denies suicidal or homicidal ideation. Denies depression. ____________________________________________  LABS (pertinent positives/negatives)  Labs Reviewed  BASIC METABOLIC PANEL - Abnormal; Notable for the following:       Result Value   Chloride 99 (*)    Glucose, Bld 261 (*)    BUN 23 (*)    Creatinine, Ser 1.23 (*)    GFR calc non Af Amer 43 (*)    GFR calc Af Amer 50 (*)    All other components within normal limits  CBC - Abnormal; Notable for the following:    RDW 14.6 (*)    All other components within normal limits  BLOOD GAS, ARTERIAL - Abnormal; Notable for the following:    pCO2 arterial 60 (*)    pO2, Arterial 51 (*)    Bicarbonate 36.3 (*)    Acid-Base Excess 9.0 (*)    All other components within normal limits  TROPONIN I  BRAIN NATRIURETIC PEPTIDE  TROPONIN I    ____________________________________________    EKG I, Lisa Roca, MD, the attending physician have personally viewed and interpreted all ECGs.  78 bpm. Normal sinus rhythm. Narrow QRS. Normal axis. Nonspecific ST and T-wave ____________________________________________  RADIOLOGY All Xrays were viewed by me. Imaging interpreted by Radiologist.  Chest x-ray two-view: Mild bilateral interstitial thickening concerning for interstitial edema.  CT chest angiogram for PE: Pending __________________________________________  PROCEDURES  Procedure(s) performed: None  Critical Care performed: None  ____________________________________________   ED COURSE / ASSESSMENT AND PLAN  Pertinent labs & imaging results that were available during my care of the patient were reviewed by me and considered in my medical decision making (see chart for details).   [name] [name] is at oxygen baseline, 100% on 2 L which is her home level. It is little  unclear to me if she is taking 40 mg of Lasix daily or 60 mg twice daily, as it lifted twice under her medication list.  In any case, symptoms of shortness of breath seem more consistent with congestive heart failure exacerbation. She is also complaining of some central chest pain is been ongoing since she woke up this point around 5 AM. EKG is nonspecific but overall reassuring as is her initial troponin as well as repeat troponin.  I did review her chart history and she was seen about a month ago at [facility] ER for a nonspecific chest pain. Did not appear to me that she is followed up with cardiology and I have recommended that she do follow up with cardiology.  Symptoms today are also atypical, and I think ACS is unlikely. Symptoms do not seem consistent with pneumonia or pulmonary embolism. I'm most suspicious of CHF exacerbation. Patient is asking to be admitted to the hospital because she does not want to go back to the nursing home. I have asked social work to discuss with her, what her options might be, but I did discuss with her that returning to her nursing home and going over options would be the best plan of action from today.  Her son stated that he wanted blood gases checked because before she is needed BiPAP. She does not appear to be that confused to me, but I will go ahead and check the ABGs.  No acidosis. PCO2 appear similar to what it has been in the past. PO2 slightly low. She does wear home oxygen. I am going to CT her chest to ensure no additional findings they're given her complaint of chest pain.   CT PE neg for acute finding.   [place] for discharge home. No acute psychiatric issues.  I spoke with nursing home, ok for transport home, patient agreeable.  Will likely need ambulance transport due to home oxygen use.  I spoke with the nurse at the home regarding increased dose of Lasix to 60 mg twice daily for 2 days.    CONSULTATIONS:  None   Patient / Family / Caregiver  informed of clinical course, medical decision-making process, and agree with plan.   I discussed return precautions, follow-up instructions, and discharge instructions with patient and/or family.  Discharge instructions:  You were evaluated for chest discomfort, and although no certain cause was found, your exam and evaluation are reassuring in the emergency department stay.  Please increase her dose of Lasix to 60 mg twice daily for just 2 days and then go back to your current 40 mg twice daily dosing to help with lower extremity edema.  Return to the emergency department immediately for any worsening chest pain, trouble breathing, nausea, sweats, weakness, numbness, fever, confusion, altered mental status, or any other symptoms concerning to you. ___________________________________________   FINAL CLINICAL IMPRESSION(S) / ED DIAGNOSES   Final diagnoses:  Bilateral leg edema  Nonspecific chest pain  Dyspnea, unspecified type              Note: This dictation was prepared with Dragon dictation. Any transcriptional errors that result from this process are  unintentional    Lisa Roca, MD 06/19/16 [place], MD 06/19/16 [phone]

## 2016-06-19 NOTE — ED Notes (Signed)
[facility] has arrived to pick up patient. 

## 2016-06-19 NOTE — ED Notes (Signed)
RN called [facility] to give discharge instructions. RN spoke to [place], [place] who verbalized understanding if increase in lasix and continued monitoring.

## 2016-06-19 NOTE — ED Triage Notes (Signed)
Pt to ED via [facility] from [facility]. Pt c/o central chest pain that started this morning at 0500. Pt states that the pain radiates down her left arm. Pt also states that she has had shortness of breath. Pt states that she would like to talk to psych MD as well as social worker so that she can be moved out of [place].

## 2016-06-21 LAB — BLOOD GAS, ARTERIAL
ACID-BASE EXCESS: 9 mmol/L — AB (ref 0.0–2.0)
Bicarbonate: 36.3 mmol/L — ABNORMAL HIGH (ref 20.0–28.0)
FIO2: 0.24
O2 Saturation: 85.3 %
PH ART: 7.39 (ref 7.350–7.450)
Patient temperature: 37
pCO2 arterial: 60 mmHg — ABNORMAL HIGH (ref 32.0–48.0)
pO2, Arterial: 51 mmHg — ABNORMAL LOW (ref 83.0–108.0)

## 2016-07-02 ENCOUNTER — Emergency Department (HOSPITAL_COMMUNITY)
Admission: EM | Admit: 2016-07-02 | Discharge: 2016-07-03 | Disposition: A | Discharge status: Other Acute Inpatient Hospital | Payer: Medicare HMO | Attending: Emergency Medicine | Admitting: Emergency Medicine

## 2016-07-02 ENCOUNTER — Encounter (HOSPITAL_COMMUNITY): Payer: Self-pay | Admitting: Emergency Medicine

## 2016-07-02 DIAGNOSIS — E119 Type 2 diabetes mellitus without complications: Secondary | ICD-10-CM

## 2016-07-02 DIAGNOSIS — Z79899 Other long term (current) drug therapy: Secondary | ICD-10-CM | POA: Insufficient documentation

## 2016-07-02 DIAGNOSIS — I5032 Chronic diastolic (congestive) heart failure: Secondary | ICD-10-CM | POA: Diagnosis not present

## 2016-07-02 DIAGNOSIS — F919 Conduct disorder, unspecified: Secondary | ICD-10-CM | POA: Diagnosis not present

## 2016-07-02 DIAGNOSIS — F200 Paranoid schizophrenia: Secondary | ICD-10-CM | POA: Diagnosis not present

## 2016-07-02 DIAGNOSIS — Z046 Encounter for general psychiatric examination, requested by authority: Secondary | ICD-10-CM | POA: Diagnosis present

## 2016-07-02 DIAGNOSIS — Z87891 Personal history of nicotine dependence: Secondary | ICD-10-CM | POA: Insufficient documentation

## 2016-07-02 DIAGNOSIS — Z96651 Presence of right artificial knee joint: Secondary | ICD-10-CM | POA: Diagnosis not present

## 2016-07-02 DIAGNOSIS — Z794 Long term (current) use of insulin: Secondary | ICD-10-CM | POA: Diagnosis not present

## 2016-07-02 DIAGNOSIS — I130 Hypertensive heart and chronic kidney disease with heart failure and stage 1 through stage 4 chronic kidney disease, or unspecified chronic kidney disease: Secondary | ICD-10-CM | POA: Insufficient documentation

## 2016-07-02 DIAGNOSIS — E1122 Type 2 diabetes mellitus with diabetic chronic kidney disease: Secondary | ICD-10-CM | POA: Diagnosis not present

## 2016-07-02 DIAGNOSIS — N189 Chronic kidney disease, unspecified: Secondary | ICD-10-CM | POA: Insufficient documentation

## 2016-07-02 DIAGNOSIS — J410 Simple chronic bronchitis: Secondary | ICD-10-CM | POA: Diagnosis not present

## 2016-07-02 DIAGNOSIS — I2510 Atherosclerotic heart disease of native coronary artery without angina pectoris: Secondary | ICD-10-CM | POA: Insufficient documentation

## 2016-07-02 LAB — URINALYSIS, ROUTINE W REFLEX MICROSCOPIC
BILIRUBIN URINE: NEGATIVE
GLUCOSE, UA: NEGATIVE mg/dL
HGB URINE DIPSTICK: NEGATIVE
KETONES UR: NEGATIVE mg/dL
NITRITE: POSITIVE — AB
PH: 5 (ref 5.0–8.0)
Protein, ur: 30 mg/dL — AB
Specific Gravity, Urine: 1.015 (ref 1.005–1.030)

## 2016-07-02 LAB — COMPREHENSIVE METABOLIC PANEL
ALBUMIN: 3.7 g/dL (ref 3.5–5.0)
ALT: 20 U/L (ref 14–54)
ANION GAP: 6 (ref 5–15)
AST: 22 U/L (ref 15–41)
Alkaline Phosphatase: 68 U/L (ref 38–126)
BILIRUBIN TOTAL: 0.5 mg/dL (ref 0.3–1.2)
BUN: 25 mg/dL — ABNORMAL HIGH (ref 6–20)
CALCIUM: 9.4 mg/dL (ref 8.9–10.3)
CO2: 32 mmol/L (ref 22–32)
Chloride: 103 mmol/L (ref 101–111)
Creatinine, Ser: 1.11 mg/dL — ABNORMAL HIGH (ref 0.44–1.00)
GFR calc Af Amer: 57 mL/min — ABNORMAL LOW (ref >60)
GFR calc non Af Amer: 49 mL/min — ABNORMAL LOW (ref >60)
GLUCOSE: 137 mg/dL — AB (ref 65–99)
POTASSIUM: 4.1 mmol/L (ref 3.5–5.1)
SODIUM: 141 mmol/L (ref 135–145)
Total Protein: 7.9 g/dL (ref 6.5–8.1)

## 2016-07-02 LAB — CBC
HEMATOCRIT: 43.3 % (ref 36.0–46.0)
Hemoglobin: 13.7 g/dL (ref 12.0–15.0)
MCH: 28.8 pg (ref 26.0–34.0)
MCHC: 31.6 g/dL (ref 30.0–36.0)
MCV: 91 fL (ref 78.0–100.0)
Platelets: 266 10*3/uL (ref 150–400)
RBC: 4.76 MIL/uL (ref 3.87–5.11)
RDW: 14.1 % (ref 11.5–15.5)
WBC: 7.8 10*3/uL (ref 4.0–10.5)

## 2016-07-02 LAB — RAPID URINE DRUG SCREEN, HOSP PERFORMED
Amphetamines: NOT DETECTED
Barbiturates: NOT DETECTED
Benzodiazepines: NOT DETECTED
COCAINE: NOT DETECTED
Opiates: NOT DETECTED
Tetrahydrocannabinol: NOT DETECTED

## 2016-07-02 LAB — VALPROIC ACID LEVEL: Valproic Acid Lvl: 42 ug/mL — ABNORMAL LOW (ref 50.0–100.0)

## 2016-07-02 LAB — ETHANOL: Alcohol, Ethyl (B): <5 mg/dL (ref <5)

## 2016-07-02 LAB — CBG MONITORING, ED
GLUCOSE-CAPILLARY: 282 mg/dL — AB (ref 65–99)
Glucose-Capillary: 198 mg/dL — ABNORMAL HIGH (ref 65–99)

## 2016-07-02 MED ORDER — INSULIN GLARGINE 100 UNIT/ML ~~LOC~~ SOLN
15.0000 [IU] | Freq: Every day | SUBCUTANEOUS | Status: DC
  Administered 2016-07-02: 15 [IU] via SUBCUTANEOUS
  Filled 2016-07-02 (×2): qty 0.15

## 2016-07-02 MED ORDER — ATORVASTATIN CALCIUM 20 MG PO TABS
20.0000 mg | ORAL_TABLET | Freq: Every day | ORAL | Status: DC
  Administered 2016-07-02: 20 mg via ORAL
  Filled 2016-07-02 (×2): qty 1

## 2016-07-02 MED ORDER — BACLOFEN 10 MG PO TABS
10.0000 mg | ORAL_TABLET | Freq: Two times a day (BID) | ORAL | Status: DC
  Administered 2016-07-02 – 2016-07-03 (×3): 10 mg via ORAL
  Filled 2016-07-02 (×3): qty 1

## 2016-07-02 MED ORDER — FAMOTIDINE 20 MG PO TABS
20.0000 mg | ORAL_TABLET | Freq: Two times a day (BID) | ORAL | Status: DC
  Administered 2016-07-02 – 2016-07-03 (×3): 20 mg via ORAL
  Filled 2016-07-02 (×3): qty 1

## 2016-07-02 MED ORDER — LISINOPRIL 20 MG PO TABS
40.0000 mg | ORAL_TABLET | Freq: Every day | ORAL | Status: DC
  Administered 2016-07-02: 40 mg via ORAL
  Filled 2016-07-02 (×2): qty 2

## 2016-07-02 MED ORDER — DIVALPROEX SODIUM 500 MG PO DR TAB: 500.0000 mg | DELAYED_RELEASE_TABLET | ORAL | Status: DC

## 2016-07-02 MED ORDER — BENZTROPINE MESYLATE 0.5 MG PO TABS
0.5000 mg | ORAL_TABLET | Freq: Two times a day (BID) | ORAL | Status: DC
  Administered 2016-07-02 – 2016-07-03 (×3): 0.5 mg via ORAL
  Filled 2016-07-02 (×4): qty 1

## 2016-07-02 MED ORDER — INSULIN ASPART 100 UNIT/ML ~~LOC~~ SOLN: 0.0000 [IU] | Freq: Three times a day (TID) | SUBCUTANEOUS | Status: DC

## 2016-07-02 MED ORDER — FLUTICASONE FUROATE-VILANTEROL 200-25 MCG/INH IN AEPB
1.0000 | INHALATION_SPRAY | Freq: Every day | RESPIRATORY_TRACT | Status: DC
  Administered 2016-07-03: 1 via RESPIRATORY_TRACT
  Filled 2016-07-02: qty 28

## 2016-07-02 MED ORDER — ALBUTEROL SULFATE (2.5 MG/3ML) 0.083% IN NEBU
2.5000 mg | INHALATION_SOLUTION | Freq: Three times a day (TID) | RESPIRATORY_TRACT | Status: DC | PRN
  Administered 2016-07-03: 2.5 mg via RESPIRATORY_TRACT
  Filled 2016-07-02: qty 3

## 2016-07-02 MED ORDER — FUROSEMIDE 40 MG PO TABS
40.0000 mg | ORAL_TABLET | Freq: Every day | ORAL | Status: DC
  Administered 2016-07-02 – 2016-07-03 (×2): 40 mg via ORAL
  Filled 2016-07-02 (×3): qty 1

## 2016-07-02 MED ORDER — INSULIN ASPART 100 UNIT/ML ~~LOC~~ SOLN
0.0000 [IU] | Freq: Every day | SUBCUTANEOUS | Status: DC
  Administered 2016-07-02: 3 [IU] via SUBCUTANEOUS

## 2016-07-02 MED ORDER — CEPHALEXIN 500 MG PO CAPS
1000.0000 mg | ORAL_CAPSULE | Freq: Two times a day (BID) | ORAL | Status: DC
  Administered 2016-07-02 – 2016-07-03 (×2): 1000 mg via ORAL
  Filled 2016-07-02 (×2): qty 2

## 2016-07-02 MED ORDER — TIOTROPIUM BROMIDE MONOHYDRATE 18 MCG IN CAPS
18.0000 ug | ORAL_CAPSULE | Freq: Every day | RESPIRATORY_TRACT | Status: DC
  Administered 2016-07-03: 18 ug via RESPIRATORY_TRACT
  Filled 2016-07-02: qty 5

## 2016-07-02 MED ORDER — INSULIN ASPART 100 UNIT/ML ~~LOC~~ SOLN
0.0000 [IU] | Freq: Three times a day (TID) | SUBCUTANEOUS | Status: DC
  Administered 2016-07-03: 5 [IU] via SUBCUTANEOUS
  Administered 2016-07-03: 8 [IU] via SUBCUTANEOUS
  Filled 2016-07-02 (×2): qty 1

## 2016-07-02 MED ORDER — ACETAMINOPHEN 325 MG PO TABS
650.0000 mg | ORAL_TABLET | ORAL | Status: DC | PRN
  Administered 2016-07-02: 650 mg via ORAL
  Filled 2016-07-02: qty 2

## 2016-07-02 MED ORDER — INSULIN ASPART 100 UNIT/ML ~~LOC~~ SOLN
2.0000 [IU] | Freq: Three times a day (TID) | SUBCUTANEOUS | Status: DC
  Administered 2016-07-02: 3 [IU] via SUBCUTANEOUS
  Filled 2016-07-02: qty 1

## 2016-07-02 MED ORDER — CARVEDILOL 25 MG PO TABS
25.0000 mg | ORAL_TABLET | Freq: Two times a day (BID) | ORAL | Status: DC
  Administered 2016-07-02 – 2016-07-03 (×2): 25 mg via ORAL
  Filled 2016-07-02 (×3): qty 1

## 2016-07-02 MED ORDER — LAMOTRIGINE 25 MG PO TABS
50.0000 mg | ORAL_TABLET | Freq: Every day | ORAL | Status: DC
  Administered 2016-07-03: 50 mg via ORAL
  Filled 2016-07-02: qty 2

## 2016-07-02 NOTE — ED Triage Notes (Signed)
Pt sent for medical clearance Per EMS due to noncompliance with taking medications and behavior concerns.

## 2016-07-02 NOTE — ED Notes (Addendum)
Pharmacy is waiting for nursing facility to fax updated medication sheet in result pharmacy is holding off on administering overdue medications.

## 2016-07-02 NOTE — ED Notes (Signed)
This RN contacted pharmacy to verify patient medications.

## 2016-07-02 NOTE — BH Assessment (Addendum)
Assessment Note  [name] [name] is an 71 y.o. female that was manic with unorganized speech during the assessment.   Per documentation in the epic chart that patient has refused to take her medication for a UTI and Schizophrenia.  Patient resides in a [facility] facility.   Patient was orientated to self and time only.  Patient reports over and her that the ED was her new [facility] facility and she was not returning the her old facility because they now want to take her, "farm".  Patient reports that her son is her legal guardian and she wants that to change as well.    Patient denies SI/HI/Psychosis/Substance Abuse.  Throughout the assessment the patient was manic and yelled.  Per documentation in the epic chart the patient the [facility] facility reports that the patient has become increasingly difficult to manage with refusal to take medications and behavior disorder. The reports suspicion of UTI. During the interview patient vacillated between extremely tearful and happy. She states that they are trying to "steal her farm" at the nursing facility. She reports that she already willed it?to her children but she overheard staff talking about the fact that she should just "kick her children in the back and sell the farm" (because they want her money). She started then crying and told me that she's been reading the Bible and that "God doesn't like ugly but he doesn't pay attention to beautiful either." She states that her son is in cahoots?with the staff at the nursing home. Patient reports prior inpatient psychiatric hospitalization.    Diagnosis: Schizophrenia   Past Medical History:  Past Medical History:  Diagnosis Date  . Anxiety   . Arthritis   . Bronchitis   . Bursitis   . CAD (coronary artery disease)   . CHF (congestive heart failure) ([place])   . Chronic cough   . Chronic kidney disease    shadow on x-ray  . COPD (chronic obstructive pulmonary disease) ([place])   . Depression   . DM2  (diabetes mellitus, type 2) ([place])   . Environmental allergies   . GERD (gastroesophageal reflux disease)   . Hypercholesteremia   . Hypertension   . Left ventricular outflow tract obstruction    a. echo 03/2014: EF 60-65%, hypernamic LV systolic fxn, mod LVH w/ LVOT gradient estimated at 68 mm Hg w/ valsalva, very small LV internal cavity size, mildly increased LV posterior wall thickness, mild Ao valve scl w/o stenosis, diastolic dysfunction, normal RVSP  . Lower extremity edema   . LVH (left ventricular hypertrophy)    a. echo suggests long standing uncontrolled htn. she will not do well when dehydrated, LV cavity obliteration  . Muscle weakness   . Obesity   . On supplemental oxygen therapy    AS NEEDED  . OSA (obstructive sleep apnea)    does not use machine  . Osteoarthritis   . Schizophrenia ([place])   . Tremors of nervous system   . Wheezing     Past Surgical History:  Procedure Laterality Date  . CATARACT EXTRACTION W/PHACO Left 09/10/2014   Procedure: CATARACT EXTRACTION PHACO AND INTRAOCULAR LENS PLACEMENT (IOC);  Surgeon: Birder Robson, MD;  Location: [facility] ORS;  Service: Ophthalmology;  Laterality: Left;  [place]: 00:44 AP%: 22.8 CDE: 10.24 Fluid lot #[id number]  . CATARACT EXTRACTION W/PHACO Right 10/15/2014   Procedure: CATARACT EXTRACTION PHACO AND INTRAOCULAR LENS PLACEMENT (IOC);  Surgeon: Birder Robson, MD;  Location: [facility] ORS;  Service: Ophthalmology;  Laterality: Right;  [place]: 00:52 AP:40.1 CDE:11.83 LOT PACK #[id number]  . CHOLECYSTECTOMY    . COLONOSCOPY  04/2011   [facility] per patient incomplete  . EYE SURGERY    . JOINT REPLACEMENT     TKR  . KNEE RECONSTRUCTION, MEDIAL PATELLAR FEMORAL LIGAMENT    . RENAL BIOPSY    . TONSILLECTOMY    . TONSILLECTOMY      Family History:  Family History  Problem Relation Age of Onset  . Heart attack Mother   . Colon cancer Neg Hx   . Liver disease Neg Hx     Social History:  reports that she has quit smoking. She has a  120.00 pack-year smoking history. She has never used smokeless tobacco. She reports that she does not drink alcohol or use drugs.  Additional Social History:  Alcohol / Drug Use History of alcohol / drug use?: No history of alcohol / drug abuse  CIWA: CIWA-Ar BP: (!) 164/93 Pulse Rate: 68 COWS:    Allergies:  Allergies  Allergen Reactions  . Haldol [Haloperidol Decanoate] Swelling and Other (See Comments)    Reaction:  Swelling of tongue and blurred vision    . Metformin Diarrhea  . Prednisone Other (See Comments)    Unknown reaction  . Raspberry Swelling and Other (See Comments)    Reaction:  Swelling of lips     Home Medications:  (Not in a hospital admission)  OB/GYN Status:  No LMP recorded. Patient is postmenopausal.  General Assessment Data Location of Assessment: [facility] TTS Assessment: In system Is this a Tele or Face-to-Face Assessment?: Face-to-Face Is this an Initial Assessment or a Re-assessment for this encounter?: Initial Assessment Marital status: Single Maiden name: NA Is patient pregnant?: No Pregnancy Status: No Living Arrangements: Group Home Can pt return to current living arrangement?: Yes Admission Status: Voluntary Is patient capable of signing voluntary admission?: Yes Referral Source: Self/Family/Friend Insurance type: [place] Screening Exam ([place]) Medical Exam completed:  (NA)  Crisis Care Plan Living Arrangements: Group Home Legal Guardian: Other: (Son Andres Ege ) Name of Psychiatrist: Unknown  Name of Therapist: Unknown  Education Status Is patient currently in school?: No Current Grade: NA Highest grade of school patient has completed: NA Name of school: NA Contact person: NA  Risk to self with the past 6 months Suicidal Ideation: No Has patient been a risk to self within the past 6 months prior to admission? : No Suicidal Intent: No Has patient had any suicidal intent within the past 6 months prior to  admission? : No Is patient at risk for suicide?: No Suicidal Plan?: No Has patient had any suicidal plan within the past 6 months prior to admission? : No Access to Means: No What has been your use of drugs/alcohol within the last 12 months?: NA Previous Attempts/Gestures: No How many times?: 0 Other Self Harm Risks: NA Triggers for Past Attempts: None known Intentional Self Injurious Behavior: None Family Suicide History: No Recent stressful life event(s): Other (Comment) (Manic ; Pt has a UTI) Persecutory voices/beliefs?: No Depression: Yes Depression Symptoms: Despondent, Insomnia, Tearfulness, Isolating, Fatigue, Guilt, Loss of interest in usual pleasures, Feeling worthless/self pity, Feeling angry/irritable Substance abuse history and/or treatment for substance abuse?: No Suicide prevention information given to non-admitted patients: Not applicable  Risk to Others within the past 6 months Homicidal Ideation: No Does patient have any lifetime risk of violence toward others beyond the six months prior to admission? : No Thoughts of Harm to Others:  No Current Homicidal Intent: No Current Homicidal Plan: No Access to Homicidal Means: No Identified Victim: NA History of harm to others?: No Assessment of Violence: None Noted Violent Behavior Description: NA Does patient have access to weapons?: No Criminal Charges Pending?: No Does patient have a court date: No Is patient on probation?: No  Psychosis Hallucinations: None noted Delusions: None noted  Mental Status Report Appearance/Hygiene: Disheveled Eye Contact: Fair Motor Activity: Freedom of movement, Restlessness, Unsteady Speech: Pressured, Loud, Tangential Level of Consciousness: Alert, Restless, Irritable Mood: Depressed, Anxious, Suspicious, Despair, Fearful, Helpless, Irritable, Sad, Worthless, low self-esteem Affect: Anxious, Depressed, Irritable Anxiety Level: Minimal Thought Processes: Tangential, Flight of  Ideas Judgement: Impaired Orientation: Person, Time Obsessive Compulsive Thoughts/Behaviors: None  Cognitive Functioning Concentration: Decreased Memory: Recent Impaired, Remote Impaired IQ: Average Insight: Poor Impulse Control: Poor Appetite: Good Weight Loss: 0 Weight Gain: 0 Sleep: Unable to Assess Total Hours of Sleep:  (UTA) Vegetative Symptoms: Decreased grooming, Staying in bed  ADLScreening [facility] Assessment Services) Patient's cognitive ability adequate to safely complete daily activities?: Yes Patient able to express need for assistance with ADLs?: Yes Independently performs ADLs?: No  Prior Inpatient Therapy Prior Inpatient Therapy: Yes Prior Therapy Dates: Unable to remember Prior Therapy Facilty/Provider(s): Unable to remem;ber  Reason for Treatment: Schizophrenia   Prior Outpatient Therapy Prior Outpatient Therapy: No Prior Therapy Dates: NA Prior Therapy Facilty/Provider(s): NA Reason for Treatment: NA Does patient have an ACCT team?: No Does patient have Intensive In-House Services?  : No Does patient have [facility] services? : No Does patient have P4CC services?: No  ADL Screening (condition at time of admission) Patient's cognitive ability adequate to safely complete daily activities?: Yes Is the patient deaf or have difficulty hearing?: No Does the patient have difficulty seeing, even when wearing glasses/contacts?: No Does the patient have difficulty concentrating, remembering, or making decisions?: Yes Patient able to express need for assistance with ADLs?: Yes Does the patient have difficulty dressing or bathing?: Yes Independently performs ADLs?: No Communication: Independent Dressing (OT): Needs assistance Grooming: Independent Feeding: Independent Bathing: Needs assistance Toileting: Independent In/Out Bed: Needs assistance Walks in Home: Independent with device (comment) Does the patient have difficulty walking or climbing stairs?:  Yes Weakness of Legs: None Weakness of Arms/Hands: None  Home Assistive Devices/Equipment Home Assistive Devices/Equipment:  [place] Rile)    Abuse/Neglect Assessment (Assessment to be complete while patient is alone) Physical Abuse: Denies Verbal Abuse: Denies Sexual Abuse: Denies Exploitation of patient/patient's resources: Denies Self-Neglect: Denies Values / Beliefs Cultural Requests During Hospitalization: None Spiritual Requests During Hospitalization: None Consults Spiritual Care Consult Needed: No Social Work Consult Needed: Yes (Comment) (AFL/Guardianship) Advance Directives (For Healthcare) Does Patient Have a Medical Advance Directive?: No Type of Advance Directive: Press photographer Would patient like information on creating a medical advance directive?: No - Patient declined    Additional Information 1:1 In Past 12 Months?: No CIRT Risk: No Elopement Risk: No Does patient have medical clearance?: Yes     Disposition: Per Waylan Boga DNP - patient meets criteria for inpatient [facility] psych placement.   Disposition Initial Assessment Completed for this Encounter: Yes Disposition of Patient: Inpatient treatment program  On Site Evaluation by:   Reviewed with Physician:    Graciella Freer [place] 07/02/2016 5:16 PM

## 2016-07-02 NOTE — ED Triage Notes (Signed)
Per [facility] pt from [facility] skilled nursing facility due to pt refusing medications for UTI and schizophrenia medication.

## 2016-07-02 NOTE — BHH Counselor (Signed)
Pt expressed to clinician that she wanted to be linked to another SNF. Clinician expressed to the pt she will contact soical work. Clinician spoke to [employer] and noted the once psychiatrically cleared the pt would return to her SNF. Pt is currently in the [facility] awaiting placement.    Edd Fabian, MS, [facility], [facility] Triage Specialist [phone]

## 2016-07-02 NOTE — ED Provider Notes (Signed)
[facility] DEPT Provider Note   CSN: [id number] Arrival date & time: 07/02/16  1254     History   Chief Complaint Chief Complaint  Patient presents with  . Urinary Tract Infection  . Medical Clearance    HPI [name] [name] is a 71 y.o. female.  HPI Patient is transported from [facility] facility. Patient has become increasingly difficult to manage with refusal to take medications and behavior disorder. The reports suspicion of UTI. During the interview patient vacillated between extremely tearful and happy. She states that they are trying to "steal her farm" at the nursing facility. She reports that she already willed it to her children but she overheard staff talking about the fact that she should just "kick her children in the back and sell the farm" (because they want her money). She started then crying and told me that she's been reading the Bible and that "God doesn't like ugly but he doesn't pay attention to beautiful either." She states that her son is in cahoots with the staff at the nursing home. She reports that he is siding with them and won't listen to her. Past Medical History:  Diagnosis Date  . Anxiety   . Arthritis   . Bronchitis   . Bursitis   . CAD (coronary artery disease)   . CHF (congestive heart failure) ([place])   . Chronic cough   . Chronic kidney disease    shadow on x-ray  . COPD (chronic obstructive pulmonary disease) ([place])   . Depression   . DM2 (diabetes mellitus, type 2) ([place])   . Environmental allergies   . GERD (gastroesophageal reflux disease)   . Hypercholesteremia   . Hypertension   . Left ventricular outflow tract obstruction    a. echo 03/2014: EF 60-65%, hypernamic LV systolic fxn, mod LVH w/ LVOT gradient estimated at 68 mm Hg w/ valsalva, very small LV internal cavity size, mildly increased LV posterior wall thickness, mild Ao valve scl w/o stenosis, diastolic dysfunction, normal RVSP  . Lower extremity edema   . LVH  (left ventricular hypertrophy)    a. echo suggests long standing uncontrolled htn. she will not do well when dehydrated, LV cavity obliteration  . Muscle weakness   . Obesity   . On supplemental oxygen therapy    AS NEEDED  . OSA (obstructive sleep apnea)    does not use machine  . Osteoarthritis   . Schizophrenia ([place])   . Tremors of nervous system   . Wheezing     Patient Active Problem List   Diagnosis Date Noted  . UTI (urinary tract infection) 01/16/2016  . Altered mental status 01/16/2016  . Metabolic encephalopathy   . Acute on chronic respiratory failure with hypoxia and hypercapnia ([place]) 10/13/2015  . Involuntary commitment 09/02/2015  . Schizoaffective disorder, bipolar type ([place])   . Urinary incontinence 10/30/2014  . GERD (gastroesophageal reflux disease) 10/30/2014  . OSA (obstructive sleep apnea) 10/30/2014  . Osteoarthrosis, unspecified whether generalized or localized, involving lower leg 10/30/2014  . COPD (chronic obstructive pulmonary disease) ([place]) 10/30/2014  . Chronic diastolic CHF (congestive heart failure) ([place]) 05/15/2014  . Morbid obesity ([place])   . History of colon polyps 03/09/2013  . Renal mass 06/26/2011  . Lytic bone lesion of hip 06/25/2011  . Hypertension 06/24/2011  . Diabetes mellitus ([place]) 06/24/2011    Past Surgical History:  Procedure Laterality Date  . CATARACT EXTRACTION W/PHACO Left 09/10/2014   Procedure: CATARACT EXTRACTION PHACO AND INTRAOCULAR LENS PLACEMENT (  [place]);  Surgeon: Birder Robson, MD;  Location: [facility] ORS;  Service: Ophthalmology;  Laterality: Left;  [place]: 00:44 AP%: 22.8 CDE: 10.24 Fluid lot #[id number]  . CATARACT EXTRACTION W/PHACO Right 10/15/2014   Procedure: CATARACT EXTRACTION PHACO AND INTRAOCULAR LENS PLACEMENT ([facility]);  Surgeon: Birder Robson, MD;  Location: [facility] ORS;  Service: Ophthalmology;  Laterality: Right;  [place]: 00:52 AP:40.1 CDE:11.83 LOT PACK #[id number]  . CHOLECYSTECTOMY    . COLONOSCOPY  04/2011   [facility]  per patient incomplete  . EYE SURGERY    . JOINT REPLACEMENT     TKR  . KNEE RECONSTRUCTION, MEDIAL PATELLAR FEMORAL LIGAMENT    . RENAL BIOPSY    . TONSILLECTOMY    . TONSILLECTOMY      OB History    Gravida Para Term Preterm AB Living   2         2   SAB TAB Ectopic Multiple Live Births                   Home Medications    Prior to Admission medications   Medication Sig Start Date End Date Taking? Authorizing Provider  acetaminophen (TYLENOL) 325 MG tablet Take 2 tablets (650 mg total) by mouth every 4 (four) hours as needed for mild pain (temp > 101.5). 10/20/15   Gladstone Lighter, MD  albuterol (ACCUNEB) 1.25 MG/3ML nebulizer solution Take 1 ampule by nebulization every 8 (eight) hours as needed for wheezing.    [provider]  albuterol (PROVENTIL) (2.5 MG/3ML) 0.083% nebulizer solution Take 2.5 mg by nebulization at bedtime.    [provider]  atorvastatin (LIPITOR) 20 MG tablet Take 20 mg by mouth at bedtime.    [provider]  baclofen (LIORESAL) 10 MG tablet Take 10 mg by mouth 2 (two) times daily.    [provider]  benztropine (COGENTIN) 0.5 MG tablet Take 0.5 mg by mouth 2 (two) times daily.     [provider]  carvedilol (COREG) 25 MG tablet Take 1 tablet (25 mg total) by mouth 2 (two) times daily. 01/21/16   Annita Brod, MD  cyclobenzaprine (FLEXERIL) 5 MG tablet Take 5 mg by mouth every 8 (eight) hours as needed for muscle spasms.    [provider]  dicyclomine (BENTYL) 20 MG tablet Take 1 tablet (20 mg total) by mouth 3 (three) times daily as needed for spasms. Patient taking differently: Take 20 mg by mouth 3 (three) times daily before meals.  11/30/14 05/22/16  Orbie Pyo, MD  divalproex (DEPAKOTE) 500 MG DR tablet Take 2 tablets (1,000 mg total) by mouth at bedtime. Patient taking differently: Take 500-1,000 mg by mouth See admin instructions. Take 500 mg by mouth in the morning and  take 1000 mg by mouth at bedtime 01/21/16   Annita Brod, MD  fluPHENAZine (PROLIXIN) 10 MG tablet Take 10 mg by mouth daily.    [provider]  fluPHENAZine decanoate (PROLIXIN) 25 MG/ML injection Inject 1 mL (25 mg total) into the muscle every 14 (fourteen) days. Patient not taking: Reported on 05/22/2016 11/14/14   Hildred Priest, MD  Fluticasone-Salmeterol (ADVAIR) 250-50 MCG/DOSE AEPB Inhale 1 puff into the lungs 2 (two) times daily.    [provider]  furosemide (LASIX) 40 MG tablet Take 1 tablet (40 mg total) by mouth daily. Patient taking differently: Take 60 mg by mouth 2 (two) times daily.  01/22/16   Annita Brod, MD  guaiFENesin (ROBITUSSIN) 100 MG/5ML SOLN Take 15  mLs by mouth every 6 (six) hours as needed for cough or to loosen phlegm. *Notify physician if cough persists more than 3 days*    [provider]  insulin aspart (NOVOLOG) 100 UNIT/ML injection Inject 2-10 Units into the skin 4 (four) times daily -  before meals and at bedtime. Sliding scale    [provider]  insulin glargine (LANTUS) 100 UNIT/ML injection Inject 15 Units into the skin at bedtime.     [provider]  lamoTRIgine (LAMICTAL) 25 MG tablet Take 50 mg by mouth daily.    [provider]  lisinopril (PRINIVIL,ZESTRIL) 5 MG tablet Take 1 tablet (5 mg total) by mouth daily. Patient taking differently: Take 40 mg by mouth daily.  01/22/16   Annita Brod, MD  LORazepam (ATIVAN) 0.5 MG tablet Take 1 tablet (0.5 mg total) by mouth every 8 (eight) hours as needed for anxiety. Pt is also able to take an additional tablet daily if needed for anxiety. Patient not taking: Reported on 05/22/2016 01/21/16   Annita Brod, MD  magnesium hydroxide (MILK OF MAGNESIA) 400 MG/5ML suspension Take 30 mLs by mouth daily as needed for mild constipation.    [provider]  meloxicam (MOBIC) 7.5 MG tablet Take 7.5 mg by mouth daily.     [provider]  metoCLOPramide (REGLAN) 10 MG tablet Take 1 tablet (10 mg total) by mouth every 6 (six) hours as needed for nausea or vomiting. 11/30/14   Orbie Pyo, MD  Multiple Vitamin (MULTIVITAMIN WITH MINERALS) TABS tablet Take 1 tablet by mouth daily.    [provider]  nitroGLYCERIN (NITROSTAT) 0.4 MG SL tablet Place 0.4 mg under the tongue every 5 (five) minutes as needed for chest pain.    [provider]  potassium chloride SA (K-DUR,KLOR-CON) 20 MEQ tablet Take 20 mEq by mouth daily.    [provider]  ranitidine (ZANTAC) 150 MG tablet Take 150 mg by mouth 2 (two) times daily.    [provider]  Skin Protectants, Misc. (EUCERIN) cream Apply 1 application topically daily.    [provider]  tiotropium (SPIRIVA) 18 MCG inhalation capsule Place 1 capsule (18 mcg total) into inhaler and inhale daily. 07/02/14   [name], MD  tiZANidine (ZANAFLEX) 2 MG tablet Take 2 mg by mouth every 8 (eight) hours as needed for muscle spasms.    [provider]  traMADol (ULTRAM) 50 MG tablet Take 1 tablet (50 mg total) by mouth every 6 (six) hours as needed for moderate pain. 01/21/16   Annita Brod, MD    Family History Family History  Problem Relation Age of Onset  . Heart attack Mother   . Colon cancer Neg Hx   . Liver disease Neg Hx     Social History Social History  Substance Use Topics  . Smoking status: Former Smoker    Packs/day: 3.00    Years: 40.00  . Smokeless tobacco: Never Used     Comment: quit in 2009  . Alcohol use No     Comment: occ.     Allergies   Haldol [haloperidol decanoate]; Metformin; Prednisone; and Raspberry   Review of Systems Review of Systems 10 Systems reviewed and are negative for acute change except as noted in the HPI.  Physical Exam Updated Vital Signs BP (!) 164/93 (BP Location: Left Wrist)   Pulse 68   Temp 97.7 F (36.5 C) (Oral)   Resp 18   SpO2  96%   Physical Exam  Constitutional: She is oriented to person, place, and time.  Patient is alert and interactive. She is nontoxic. No respiratory distress. Obesity.  HENT:  Head: Normocephalic and atraumatic.  Nose: Nose normal.  Mouth/Throat: Oropharynx is clear and moist.  Eyes: Conjunctivae and EOM are normal. Pupils are equal, round, and reactive to light.  Cardiovascular: Normal rate, regular rhythm, normal heart sounds and intact distal pulses.   Pulmonary/Chest: Effort normal and breath sounds normal.  Abdominal: Soft. Bowel sounds are normal. She exhibits no distension. There is no tenderness. There is no guarding.  Musculoskeletal: Normal range of motion.  1+ pitting edema bilateral lower extremities.  Neurological: She is alert and oriented to person, place, and time. No cranial nerve deficit. She exhibits normal muscle tone. Coordination normal.  Skin: Skin is warm and dry.  Psychiatric:  Mood is labile.     ED Treatments / Results  Labs (all labs ordered are listed, but only abnormal results are displayed) Labs Reviewed  COMPREHENSIVE METABOLIC PANEL - Abnormal; Notable for the following:       Result Value   Glucose, Bld 137 (*)    BUN 25 (*)    Creatinine, Ser 1.11 (*)    GFR calc non Af Amer 49 (*)    GFR calc Af Amer 57 (*)    All other components within normal limits  URINALYSIS, ROUTINE W REFLEX MICROSCOPIC - Abnormal; Notable for the following:    APPearance CLOUDY (*)    Protein, ur 30 (*)    Nitrite POSITIVE (*)    Leukocytes, UA LARGE (*)    Bacteria, UA MANY (*)    Squamous Epithelial / LPF 0-5 (*)    All other components within normal limits  VALPROIC ACID LEVEL - Abnormal; Notable for the following:    Valproic Acid Lvl 42 (*)    All other components within normal limits  URINE CULTURE  ETHANOL  CBC  RAPID URINE DRUG SCREEN, HOSP PERFORMED    EKG  EKG Interpretation None       Radiology No results found.  Procedures Procedures  (including critical care time)  Medications Ordered in ED Medications  acetaminophen (TYLENOL) tablet 650 mg (not administered)  albuterol (PROVENTIL) (2.5 MG/3ML) 0.083% nebulizer solution 2.5 mg (not administered)  atorvastatin (LIPITOR) tablet 20 mg (not administered)  baclofen (LIORESAL) tablet 10 mg (10 mg Oral Given 07/02/16 1515)  benztropine (COGENTIN) tablet 0.5 mg (0.5 mg Oral Given 07/02/16 1515)  carvedilol (COREG) tablet 25 mg (not administered)  divalproex (DEPAKOTE) DR tablet 500-1,000 mg (not administered)  fluticasone furoate-vilanterol (BREO ELLIPTA) 200-25 MCG/INH 1 puff (not administered)  furosemide (LASIX) tablet 40 mg (40 mg Oral Given 07/02/16 1515)  insulin aspart (novoLOG) injection 2-10 Units (not administered)  insulin glargine (LANTUS) injection 15 Units (not administered)  lamoTRIgine (LAMICTAL) tablet 50 mg (not administered)  lisinopril (PRINIVIL,ZESTRIL) tablet 40 mg (not administered)  famotidine (PEPCID) tablet 20 mg (20 mg Oral Given 07/02/16 1515)  tiotropium (SPIRIVA) inhalation capsule 18 mcg (not administered)  cephALEXin (KEFLEX) capsule 1,000 mg (not administered)     Initial Impression / Assessment and Plan / ED Course  I have reviewed the triage vital signs and the nursing notes.  Pertinent labs & imaging results that were available during my care of the patient were reviewed by me and considered in my medical decision making (see chart for details).     Final Clinical Impressions(s) / ED Diagnoses   Final diagnoses:  Paranoid schizophrenia ([place])  Disruptive behavior disorder  Simple chronic bronchitis ([place])  Type 2 diabetes mellitus without complication, with long-term current use of insulin [facility])   Patient referred to the emergency department for medical clearance for decompensated behavior disorder at nursing home. And is refusing medications and has developed disruptive behaviors. She is exhibiting findings suggestive of paranoid  schizophrenia in the emergency department. Also there was concern for UTI. Patient does have positive urinalysis. She is otherwise alert and nontoxic. She has eaten and is not showing signs of physical distress. Aside from UTI all other medical problems appear to be stable at this time. Patient will need her baseline medications as ordered for treatment of COPD and diabetes and hypertension. Keflex twice daily for UTI. Patient is medically cleared for psychiatric evaluation and management of paranoid schizophrenia with behavior disorder. New Prescriptions New Prescriptions   No medications on file     Charlesetta Shanks, MD 07/02/16 1704

## 2016-07-03 ENCOUNTER — Emergency Department (HOSPITAL_COMMUNITY): Payer: Medicare HMO

## 2016-07-03 DIAGNOSIS — F200 Paranoid schizophrenia: Secondary | ICD-10-CM | POA: Diagnosis not present

## 2016-07-03 LAB — CBG MONITORING, ED
GLUCOSE-CAPILLARY: 254 mg/dL — AB (ref 65–99)
Glucose-Capillary: 213 mg/dL — ABNORMAL HIGH (ref 65–99)

## 2016-07-03 MED ORDER — CEPHALEXIN 500 MG PO CAPS: 1000.0000 mg | ORAL_CAPSULE | Freq: Two times a day (BID) | ORAL | 0 refills | Status: DC

## 2016-07-03 MED ORDER — LISINOPRIL 40 MG PO TABS: 40.0000 mg | ORAL_TABLET | Freq: Every day | ORAL | 0 refills | Status: DC

## 2016-07-03 NOTE — ED Notes (Signed)
Attempted to call report to [facility] in [place].  Voice message left.

## 2016-07-03 NOTE — Progress Notes (Signed)
Shirlee Limerick with intake at [facility] inquired if patient's IVC papers can be faxed at fax#: [phone].  Per Shirlee Limerick, once they receive the IVC they will contact the ED with the accepting provider.  [facility]-ED RN Caren Griffins was notified.  Verlon Setting, [facility] Disposition staff 07/03/2016 1:40 PM

## 2016-07-03 NOTE — ED Notes (Signed)
Patient refused medications until she has finished her lunch.  Talking to son on the phone.  Patient upset because she did not talk to a psychiatrist before being transferred to another unit.  Explained to patient that ED physician saw patient and a counselor and she would see a psychiatrist when she gets to [facility].

## 2016-07-03 NOTE — ED Provider Notes (Signed)
Pt has been accepted for transfer to [facility] by Dr. Presley Raddle, MD 07/03/16 1540

## 2016-07-03 NOTE — ED Notes (Signed)
[facility]iff[facility] notified for transport.

## 2016-07-04 LAB — URINE CULTURE: Culture: >=100000 — AB

## 2016-07-05 ENCOUNTER — Telehealth: Payer: Self-pay | Admitting: Emergency Medicine

## 2016-07-05 NOTE — Telephone Encounter (Signed)
Post ED Visit - Positive Culture Follow-up  Culture report reviewed by antimicrobial stewardship pharmacist:  []  Elenor Quinones, Pharm.D. [x]  Heide Guile, Pharm.D., BCPS AQ-ID []  Parks Neptune, Pharm.D., BCPS []  Alycia Rossetti, Pharm.D., BCPS []  [place], [place].D., BCPS, AAHIVP []  Legrand Como, Pharm.D., BCPS, AAHIVP []  Salome Arnt, PharmD, BCPS []  Dimitri Ped, PharmD, BCPS []  Vincenza Hews, PharmD, BCPS  Positive urine culture Treated with cephalexin, organism sensitive to the same and no further patient follow-up is required at this time.  Hazle Nordmann 07/05/2016, 11:13 AM

## 2016-07-05 NOTE — Telephone Encounter (Signed)
Post ED Visit - Positive Culture Follow-up  Culture report reviewed by antimicrobial stewardship pharmacist:  []  Elenor Quinones, Pharm.D. [x]  Heide Guile, Pharm.D., BCPS AQ-ID []  [place]ks Neptune, Pharm.D., BCPS []  Alycia Rossetti, Pharm.D., BCPS []  [place], Pharm.D., BCPS, AAHIVP []  Legrand Como, Pharm.D., BCPS, AAHIVP []  Salome Arnt, PharmD, BCPS []  Dimitri Ped, PharmD, BCPS []  Vincenza Hews, PharmD, BCPS  Positive urine culture Treated with cephalexin, organism sensitive to the same and no further patient follow-up is required at this time.  Hazle Nordmann 07/05/2016, 11:11 AM

## 2016-12-10 ENCOUNTER — Other Ambulatory Visit: Payer: Self-pay | Admitting: Ophthalmology

## 2016-12-10 DIAGNOSIS — H532 Diplopia: Secondary | ICD-10-CM

## 2016-12-14 ENCOUNTER — Ambulatory Visit
Admission: RE | Admit: 2016-12-14 | Discharge: 2016-12-14 | Disposition: A | Discharge status: Home / Self Care | Payer: Medicare HMO | Source: Ambulatory Visit | Attending: Ophthalmology | Admitting: Ophthalmology

## 2016-12-14 DIAGNOSIS — H532 Diplopia: Secondary | ICD-10-CM | POA: Insufficient documentation

## 2017-01-25 ENCOUNTER — Other Ambulatory Visit: Payer: Self-pay

## 2017-01-25 ENCOUNTER — Emergency Department
Admission: EM | Admit: 2017-01-25 | Discharge: 2017-01-26 | Disposition: A | Discharge status: Home / Self Care | Payer: Medicare HMO | Attending: Emergency Medicine | Admitting: Emergency Medicine

## 2017-01-25 DIAGNOSIS — Z96659 Presence of unspecified artificial knee joint: Secondary | ICD-10-CM | POA: Diagnosis not present

## 2017-01-25 DIAGNOSIS — R079 Chest pain, unspecified: Secondary | ICD-10-CM | POA: Diagnosis not present

## 2017-01-25 DIAGNOSIS — J449 Chronic obstructive pulmonary disease, unspecified: Secondary | ICD-10-CM | POA: Diagnosis not present

## 2017-01-25 DIAGNOSIS — Z87891 Personal history of nicotine dependence: Secondary | ICD-10-CM | POA: Diagnosis not present

## 2017-01-25 DIAGNOSIS — Z79899 Other long term (current) drug therapy: Secondary | ICD-10-CM | POA: Diagnosis not present

## 2017-01-25 DIAGNOSIS — E119 Type 2 diabetes mellitus without complications: Secondary | ICD-10-CM | POA: Insufficient documentation

## 2017-01-25 DIAGNOSIS — R0602 Shortness of breath: Secondary | ICD-10-CM

## 2017-01-25 DIAGNOSIS — Z794 Long term (current) use of insulin: Secondary | ICD-10-CM | POA: Insufficient documentation

## 2017-01-25 DIAGNOSIS — N189 Chronic kidney disease, unspecified: Secondary | ICD-10-CM | POA: Insufficient documentation

## 2017-01-25 DIAGNOSIS — I5032 Chronic diastolic (congestive) heart failure: Secondary | ICD-10-CM | POA: Diagnosis not present

## 2017-01-25 DIAGNOSIS — I130 Hypertensive heart and chronic kidney disease with heart failure and stage 1 through stage 4 chronic kidney disease, or unspecified chronic kidney disease: Secondary | ICD-10-CM | POA: Diagnosis not present

## 2017-01-25 NOTE — ED Triage Notes (Signed)
Pt brought in via ems from [facility].  Pt has chest pain since this afternoon. Pt also reports sob. Pt asking for dr Talmadge Chad on arrival to er treatment room.  Pt alert.  Speech clear.

## 2017-01-26 ENCOUNTER — Emergency Department: Payer: Medicare HMO

## 2017-01-26 LAB — CBC
HCT: 42.5 % (ref 35.0–47.0)
Hemoglobin: 14 g/dL (ref 12.0–16.0)
MCH: 29.7 pg (ref 26.0–34.0)
MCHC: 32.9 g/dL (ref 32.0–36.0)
MCV: 90.4 fL (ref 80.0–100.0)
PLATELETS: 260 10*3/uL (ref 150–440)
RBC: 4.7 MIL/uL (ref 3.80–5.20)
RDW: 13.9 % (ref 11.5–14.5)
WBC: 9.4 10*3/uL (ref 3.6–11.0)

## 2017-01-26 LAB — BLOOD GAS, ARTERIAL
ACID-BASE EXCESS: 10 mmol/L — AB (ref 0.0–2.0)
BICARBONATE: 35 mmol/L — AB (ref 20.0–28.0)
FIO2: 0.21
O2 SAT: 93 %
PATIENT TEMPERATURE: 37
pCO2 arterial: 47 mmHg (ref 32.0–48.0)
pH, Arterial: 7.48 — ABNORMAL HIGH (ref 7.350–7.450)
pO2, Arterial: 62 mmHg — ABNORMAL LOW (ref 83.0–108.0)

## 2017-01-26 LAB — BASIC METABOLIC PANEL
Anion gap: 11 (ref 5–15)
BUN: 19 mg/dL (ref 6–20)
CALCIUM: 9.5 mg/dL (ref 8.9–10.3)
CHLORIDE: 100 mmol/L — AB (ref 101–111)
CO2: 29 mmol/L (ref 22–32)
CREATININE: 0.95 mg/dL (ref 0.44–1.00)
GFR calc non Af Amer: 58 mL/min — ABNORMAL LOW (ref >60)
Glucose, Bld: 171 mg/dL — ABNORMAL HIGH (ref 65–99)
Potassium: 4.2 mmol/L (ref 3.5–5.1)
SODIUM: 140 mmol/L (ref 135–145)

## 2017-01-26 LAB — BRAIN NATRIURETIC PEPTIDE: B NATRIURETIC PEPTIDE 5: 74 pg/mL (ref 0.0–100.0)

## 2017-01-26 LAB — TROPONIN I

## 2017-01-26 LAB — FIBRIN DERIVATIVES D-DIMER (ARMC ONLY): FIBRIN DERIVATIVES D-DIMER (ARMC): 601.04 ng{FEU}/mL — AB (ref 0.00–499.00)

## 2017-01-26 MED ORDER — IPRATROPIUM-ALBUTEROL 0.5-2.5 (3) MG/3ML IN SOLN
3.0000 mL | Freq: Once | RESPIRATORY_TRACT | Status: AC
  Administered 2017-01-26: 3 mL via RESPIRATORY_TRACT
  Filled 2017-01-26: qty 3

## 2017-01-26 MED ORDER — IOPAMIDOL (ISOVUE-370) INJECTION 76%
100.0000 mL | Freq: Once | INTRAVENOUS | Status: AC | PRN
  Administered 2017-01-26: 100 mL via INTRAVENOUS

## 2017-01-26 NOTE — ED Notes (Signed)
Pt also states pain in left arm

## 2017-01-26 NOTE — ED Notes (Signed)
Patient transported to CT 

## 2017-01-26 NOTE — ED Provider Notes (Signed)
[facility] Emergency Department Provider Note   ____________________________________________   First MD Initiated Contact with Patient 01/26/17 0007     (approximate)  I have reviewed the triage vital signs and the nursing notes.   HISTORY  Chief Complaint Chest Pain    HPI [name] [name] is a 71 y.o. female who comes into the hospital today with some chest pain and shortness of breath.  The patient states that her arm has been hurting all day but the chest pain and shortness of breath started after dinner.  The patient reports that this was around 530.  She denies any nausea vomiting dizziness or lightheadedness.  It is sharp pain in the middle of her chest that she states is worse when she takes a deep breath.  The patient has a history of heart failure and she reports that there has been multiple deaths at her nursing facility recently.  She states that she also just found out that her cousin has died.  The patient has been taking her medications and denies any swelling in her legs.  She states that she has had some swelling in her fingers.  The patient denies any abdominal pain.  Her pain is an 8 out of 10 in intensity.  The patient is here for evaluation.  She reports that she wants to stay in the hospital.   Past Medical History:  Diagnosis Date  . Anxiety   . Arthritis   . Bronchitis   . Bursitis   . CAD (coronary artery disease)   . CHF (congestive heart failure) ([place])   . Chronic cough   . Chronic kidney disease    shadow on x-ray  . COPD (chronic obstructive pulmonary disease) ([place])   . Depression   . DM2 (diabetes mellitus, type 2) ([place])   . Environmental allergies   . GERD (gastroesophageal reflux disease)   . Hypercholesteremia   . Hypertension   . Left ventricular outflow tract obstruction    a. echo 03/2014: EF 60-65%, hypernamic LV systolic fxn, mod LVH w/ LVOT gradient estimated at 68 mm Hg w/ valsalva, very small LV internal cavity  size, mildly increased LV posterior wall thickness, mild Ao valve scl w/o stenosis, diastolic dysfunction, normal RVSP  . Lower extremity edema   . LVH (left ventricular hypertrophy)    a. echo suggests long standing uncontrolled htn. she will not do well when dehydrated, LV cavity obliteration  . Muscle weakness   . Obesity   . On supplemental oxygen therapy    AS NEEDED  . OSA (obstructive sleep apnea)    does not use machine  . Osteoarthritis   . Schizophrenia ([place])   . Tremors of nervous system   . Wheezing     Patient Active Problem List   Diagnosis Date Noted  . UTI (urinary tract infection) 01/16/2016  . Altered mental status 01/16/2016  . Metabolic encephalopathy   . Acute on chronic respiratory failure with hypoxia and hypercapnia ([place]) 10/13/2015  . Involuntary commitment 09/02/2015  . Schizoaffective disorder, bipolar type ([place])   . Urinary incontinence 10/30/2014  . GERD (gastroesophageal reflux disease) 10/30/2014  . OSA (obstructive sleep apnea) 10/30/2014  . Osteoarthrosis, unspecified whether generalized or localized, involving lower leg 10/30/2014  . COPD (chronic obstructive pulmonary disease) ([place]) 10/30/2014  . Chronic diastolic CHF (congestive heart failure) ([place]) 05/15/2014  . Morbid obesity ([place])   . History of colon polyps 03/09/2013  . Renal mass 06/26/2011  . Lytic bone  lesion of hip 06/25/2011  . Hypertension 06/24/2011  . Diabetes mellitus ([place]) 06/24/2011    Past Surgical History:  Procedure Laterality Date  . CATARACT EXTRACTION W/PHACO Left 09/10/2014   Procedure: CATARACT EXTRACTION PHACO AND INTRAOCULAR LENS PLACEMENT (IOC);  Surgeon: Birder Robson, MD;  Location: [facility] ORS;  Service: Ophthalmology;  Laterality: Left;  [place]: 00:44 AP%: 22.8 CDE: 10.24 Fluid lot #[id number]  . CATARACT EXTRACTION W/PHACO Right 10/15/2014   Procedure: CATARACT EXTRACTION PHACO AND INTRAOCULAR LENS PLACEMENT (IOC);  Surgeon: Birder Robson, MD;  Location: [facility]  ORS;  Service: Ophthalmology;  Laterality: Right;  [place]: 00:52 AP:40.1 CDE:11.83 LOT PACK #[id number]  . CHOLECYSTECTOMY    . COLONOSCOPY  04/2011   [facility] per patient incomplete  . EYE SURGERY    . JOINT REPLACEMENT     TKR  . KNEE RECONSTRUCTION, MEDIAL PATELLAR FEMORAL LIGAMENT    . RENAL BIOPSY    . TONSILLECTOMY    . TONSILLECTOMY      Prior to Admission medications   Medication Sig Start Date End Date Taking? Authorizing Provider  acetaminophen (TYLENOL) 325 MG tablet Take 2 tablets (650 mg total) by mouth every 4 (four) hours as needed for mild pain (temp > 101.5). 10/20/15   Gladstone Lighter, MD  albuterol (ACCUNEB) 1.25 MG/3ML nebulizer solution Take 1 ampule by nebulization every 8 (eight) hours as needed for wheezing.    [provider]  albuterol (PROVENTIL) (2.5 MG/3ML) 0.083% nebulizer solution Take 2.5 mg by nebulization at bedtime.    [provider]  atorvastatin (LIPITOR) 20 MG tablet Take 20 mg by mouth at bedtime.    [provider]  baclofen (LIORESAL) 10 MG tablet Take 10 mg by mouth 2 (two) times daily.    [provider]  benztropine (COGENTIN) 0.5 MG tablet Take 0.5 mg by mouth 2 (two) times daily.     [provider]  carvedilol (COREG) 25 MG tablet Take 1 tablet (25 mg total) by mouth 2 (two) times daily. 01/21/16   Annita Brod, MD  cephALEXin (KEFLEX) 500 MG capsule Take 2 capsules (1,000 mg total) by mouth 2 (two) times daily. 07/03/16   Rankin, Shuvon B, NP  divalproex (DEPAKOTE) 500 MG DR tablet Take 2 tablets (1,000 mg total) by mouth at bedtime. Patient taking differently: Take 500-1,000 mg by mouth See admin instructions. Take 500 mg by mouth in the morning and take 1000 mg by mouth at bedtime 01/21/16   Annita Brod, MD  Fluticasone-Salmeterol (ADVAIR) 250-50 MCG/DOSE AEPB Inhale 1 puff into the lungs 2 (two) times daily.    [provider]  furosemide (LASIX) 40 MG tablet Take 1 tablet (40 mg  total) by mouth daily. Patient taking differently: Take 60 mg by mouth daily.  01/22/16   Annita Brod, MD  guaiFENesin (ROBITUSSIN) 100 MG/5ML SOLN Take 15 mLs by mouth every 6 (six) hours as needed for cough or to loosen phlegm. *Notify physician if cough persists more than 3 days*    [provider]  insulin aspart (NOVOLOG) 100 UNIT/ML injection Inject 2-10 Units into the skin 4 (four) times daily -  before meals and at bedtime. Sliding scale 60-150: 0 units 151-199: 2 units 200-249: 4 units  250-299: 6 units  300-349: 8 units 350-399: 10 units  >400: call MD    [provider]  insulin glargine (LANTUS) 100 UNIT/ML injection Inject 15 Units into the skin at bedtime.     [provider]  lamoTRIgine (LAMICTAL)  25 MG tablet Take 50 mg by mouth daily.    [provider]  lisinopril (PRINIVIL,ZESTRIL) 40 MG tablet Take 1 tablet (40 mg total) by mouth at bedtime. 07/03/16   Rankin, Shuvon B, NP  magnesium hydroxide (MILK OF MAGNESIA) 400 MG/5ML suspension Take 30 mLs by mouth daily as needed for mild constipation.    [provider]  meloxicam (MOBIC) 7.5 MG tablet Take 7.5 mg by mouth daily.    [provider]  metoCLOPramide (REGLAN) 10 MG tablet Take 1 tablet (10 mg total) by mouth every 6 (six) hours as needed for nausea or vomiting. 11/30/14   Orbie Pyo, MD  Multiple Vitamin (MULTIVITAMIN WITH MINERALS) TABS tablet Take 1 tablet by mouth daily.    [provider]  nitroGLYCERIN (NITROSTAT) 0.4 MG SL tablet Place 0.4 mg under the tongue every 5 (five) minutes as needed for chest pain.    [provider]  potassium chloride SA (K-DUR,KLOR-CON) 20 MEQ tablet Take 20 mEq by mouth daily.    [provider]  ranitidine (ZANTAC) 150 MG tablet Take 150 mg by mouth 2 (two) times daily.    [provider]  Skin Protectants, Misc. (EUCERIN) cream Apply 1 application topically daily.     [provider]  tiotropium (SPIRIVA) 18 MCG inhalation capsule Place 1 capsule (18 mcg total) into inhaler and inhale daily. 07/02/14   [name], MD    Allergies Haldol [haloperidol decanoate]; Metformin; Prednisone; and Raspberry  Family History  Problem Relation Age of Onset  . Heart attack Mother   . Colon cancer Neg Hx   . Liver disease Neg Hx     Social History Social History   Tobacco Use  . Smoking status: Former Smoker    Packs/day: 3.00    Years: 40.00    Pack years: 120.00  . Smokeless tobacco: Never Used  . Tobacco comment: quit in 2009  Substance Use Topics  . Alcohol use: No    Comment: occ.  . Drug use: No    Review of Systems  Constitutional: No fever/chills Eyes: No visual changes. ENT: No sore throat. Cardiovascular:  chest pain. Respiratory:  shortness of breath. Gastrointestinal: No abdominal pain.  No nausea, no vomiting.  No diarrhea.  No constipation. Genitourinary: Negative for dysuria. Musculoskeletal: Negative for back pain. Skin: Negative for rash. Neurological: Negative for headaches, focal weakness or numbness.   ____________________________________________   PHYSICAL EXAM:  VITAL SIGNS: ED Triage Vitals  Enc Vitals Group     BP 01/26/17 0001 (!) 133/95     Pulse Rate 01/26/17 0001 85     Resp 01/26/17 0001 20     Temp 01/26/17 0001 98.3 F (36.8 C)     Temp Source 01/26/17 0001 Oral     SpO2 01/26/17 0001 95 %     Weight 01/25/17 2358 275 lb (124.7 kg)     Height 01/25/17 2358 5\' 6"  (1.676 m)     Head Circumference --      [facility] Flow --      Pain Score 01/25/17 2358 8     Pain Loc --      Pain Edu? --      Excl. in [place]? --     Constitutional: Alert and oriented. Well appearing and in moderate distress. Eyes: Conjunctivae are normal. PERRL. EOMI. Head: Atraumatic. Nose: No congestion/rhinnorhea. Mouth/Throat: Mucous membranes are moist.  Oropharynx non-erythematous. Cardiovascular: Normal rate,  regular rhythm. Grossly normal heart sounds.  Good peripheral circulation. Respiratory: Shallow tachypnea with some diminished breath sounds throughout. Gastrointestinal: Soft and nontender. No distention.  Positive bowel sounds Musculoskeletal: No lower extremity tenderness nor edema.   Neurologic:  Normal speech and language.  Skin:  Skin is warm, dry and intact.  Psychiatric: Mood and affect are normal.   ____________________________________________   LABS (all labs ordered are listed, but only abnormal results are displayed)  Labs Reviewed  BASIC METABOLIC PANEL - Abnormal; Notable for the following components:      Result Value   Chloride 100 (*)    Glucose, Bld 171 (*)    GFR calc non Af Amer 58 (*)    All other components within normal limits  FIBRIN DERIVATIVES D-DIMER ([facility] ONLY) - Abnormal; Notable for the following components:   Fibrin derivatives D-dimer ([facility]) 601.04 (*)    All other components within normal limits  CBC  TROPONIN I  BRAIN NATRIURETIC PEPTIDE  TROPONIN I   ____________________________________________  EKG  ED ECG REPORT I, Loney Hering, the attending physician, personally viewed and interpreted this ECG.   Date: 01/26/2017  EKG Time: 0004  Rate: 83  Rhythm: normal sinus rhythm  Axis: normal  Intervals:none  ST&T Change: none  ____________________________________________  RADIOLOGY  Dg Chest 2 View  Result Date: 01/26/2017 CLINICAL DATA:  71 year old female with chest pain and shortness of breath. EXAM: CHEST  2 VIEW COMPARISON:  Chest radiograph dated 07/03/2016 FINDINGS: The lungs are clear. There is no pleural effusion or pneumothorax. The cardiac silhouette is within normal limits. No acute osseous pathology. Right upper quadrant cholecystectomy clips. IMPRESSION: No active cardiopulmonary disease. Electronically Signed   By: Anner Crete M.D.   On: 01/26/2017 01:28   Ct Angio Chest Pe W And/or Wo Contrast  Result Date:  01/26/2017 CLINICAL DATA:  71 y/o  F; chest pain and shortness of breath. EXAM: CT ANGIOGRAPHY CHEST WITH CONTRAST TECHNIQUE: Multidetector CT imaging of the chest was performed using the standard protocol during bolus administration of intravenous contrast. Multiplanar CT image reconstructions and MIPs were obtained to evaluate the vascular anatomy. CONTRAST:  173mL ISOVUE-370 IOPAMIDOL (ISOVUE-370) INJECTION 76% COMPARISON:  06/19/2016 chest CTA FINDINGS: Cardiovascular: Mild cardiomegaly. Mild coronary artery calcification. No pericardial effusion. Normal caliber thoracic aorta with mild calcific atherosclerosis. Respiratory motion artifact. Satisfactory opacification of pulmonary arteries. No pulmonary embolus identified. Mediastinum/Nodes: 10 mm nodule in right lobe of thyroid. No mediastinal adenopathy. Normal thoracic esophagus. Lungs/Pleura: Lungs are clear. No pleural effusion or pneumothorax. Upper Abdomen: No acute abnormality. Musculoskeletal: No chest wall abnormality. No acute or significant osseous findings. Review of the MIP images confirms the above findings. IMPRESSION: 1. Respiratory motion artifact.  No pulmonary embolus identified. 2. Mild cardiomegaly. Mild coronary artery and aortic calcific atherosclerosis. 3. Otherwise unremarkable CTA of the chest. Electronically Signed   By: Kristine Garbe M.D.   On: 01/26/2017 04:03    ____________________________________________   PROCEDURES  Procedure(s) performed: None  Procedures  Critical Care performed: No  ____________________________________________   INITIAL IMPRESSION / ASSESSMENT AND PLAN / ED COURSE  As part of my medical decision making, I reviewed the following data within the electronic MEDICAL RECORD NUMBER Notes from prior ED visits and [place] Controlled Substance Database   This is a 71 year old female who comes into the hospital today with some chest pain and shortness of breath.  The patient does have a history  of CHF as well as COPD.  My differential diagnosis includes pneumonia, CHF exacerbation, COPD exacerbation,  ACS.  We did draw some blood work on the patient.  I will give the patient a DuoNeb treatment to see if that helps with some of her diminished breath sounds and shortness of breath.  I will also send the patient for chest x-ray and add a d-dimer.  I will reassess the patient once I received all of her results.     The patient did have an elevated d-dimer so I sent her for a CTA of her chest.  The patient CTA was unremarkable.  I went back into check on the patient and she was sleeping comfortably.  I listen to her lungs and she was moving better air.  She was in no acute distress.  The patient states that she no longer wants to be at [facility].  She did asked to speak with social work.  I will have the patient evaluated by social work and then she will be dispositioned.  Otherwise the patient has no other complaints at this time. ____________________________________________   FINAL CLINICAL IMPRESSION(S) / ED DIAGNOSES  Final diagnoses:  Shortness of breath  Chest pain, unspecified type     ED Discharge Orders    None       Note:  This document was prepared using Dragon voice recognition software and may include unintentional dictation errors.    Loney Hering, MD 01/26/17 [phone]

## 2017-01-26 NOTE — ED Notes (Signed)
Took patient off bedpan.

## 2017-01-26 NOTE — ED Notes (Addendum)
Attempted to call pts son x 2 to notify him pt is being discharged. Message states that voice mailbox had not been set up

## 2017-01-26 NOTE — ED Notes (Signed)
Patient is resting comfortably. 

## 2017-01-26 NOTE — Discharge Instructions (Signed)
Please seek medical attention for any high fevers, chest pain, shortness of breath, change in behavior, persistent vomiting, bloody stool or any other new or concerning symptoms.  

## 2017-01-26 NOTE — Care Management Note (Signed)
Case Management Note  Patient Details  Name: KAEYA SCHIFFER MRN: [id number] Date of Birth: 1945/03/03  Subjective/Objective:    Spoke to patient , after finding out there is a CSW consult. She is interested in talking to the [facility] about paying privately for some facility on [facility] that she has heard about. I have told the pt. I will relay the message to the social worker on today.                Action/Plan:   Expected Discharge Date:                  Expected Discharge Plan:     In-House Referral:     Discharge planning Services     Post Acute Care Choice:    Choice offered to:     DME Arranged:    DME Agency:     HH Arranged:    HH Agency:     Status of Service:     If discussed at [employer] of Stay Meetings, dates discussed:    Additional Comments:  Beau Fanny, RN 01/26/2017, 8:51 AM

## 2017-01-26 NOTE — ED Notes (Signed)
Pt states she is in pain. Pt was sleeping when I entered the room. Pt requesting a Education officer, museum. Notified pt that there non here at night.

## 2017-01-26 NOTE — Clinical Social Work Note (Signed)
CSW received consult for "placement." CSW met with pt at bedside. No family at bedside. CSW introduces self and explained Social Work role. Pt from [facility] (SNF) and has been a long term care resident there for 6 months, per pt. Pt stated she wants assistance moving to an apartment on [address]. CSW utilized motivational interviewing to assist pt in realizing the safety and care needs for return to the proper level of care, which is [facility]. Pt states son has already discussed remaining in SNF is a safer option for pt. CSW encouraged pt to speak with son further about her concerns to move, but at this time pt is agreeable to return to [facility].  CSW spoke with Broadus John in admission at [facility] to confirm pt's return. CSW updated RN and EDP. RN Rush Landmark to call report to [phone] for room 407. ED Secretary to arrange EMS for pt. CSW signing off as no further Social Work needs identified.   [name] [name], [name] [name], [name] [name] Social Worker-ED [phone]

## 2017-01-26 NOTE — ED Notes (Signed)
Patient transported to X-ray 

## 2017-01-26 NOTE — ED Notes (Signed)
EMS  CALLED  FOR  TRANSPORT

## 2017-01-26 NOTE — ED Notes (Signed)
Pt ready for transfer and dressed in personal cloths. NAD noted. No IV in place and monitor is no longer connected. Pt updated

## 2017-05-02 ENCOUNTER — Other Ambulatory Visit: Payer: Self-pay

## 2017-05-02 ENCOUNTER — Emergency Department
Admission: EM | Admit: 2017-05-02 | Discharge: 2017-05-02 | Disposition: A | Discharge status: Home / Self Care | Payer: Medicare HMO | Attending: Emergency Medicine | Admitting: Emergency Medicine

## 2017-05-02 ENCOUNTER — Emergency Department: Payer: Medicare HMO

## 2017-05-02 ENCOUNTER — Encounter: Payer: Self-pay | Admitting: Emergency Medicine

## 2017-05-02 DIAGNOSIS — Z794 Long term (current) use of insulin: Secondary | ICD-10-CM | POA: Insufficient documentation

## 2017-05-02 DIAGNOSIS — I2510 Atherosclerotic heart disease of native coronary artery without angina pectoris: Secondary | ICD-10-CM | POA: Diagnosis not present

## 2017-05-02 DIAGNOSIS — I5032 Chronic diastolic (congestive) heart failure: Secondary | ICD-10-CM | POA: Diagnosis not present

## 2017-05-02 DIAGNOSIS — Z96659 Presence of unspecified artificial knee joint: Secondary | ICD-10-CM | POA: Diagnosis not present

## 2017-05-02 DIAGNOSIS — Z79899 Other long term (current) drug therapy: Secondary | ICD-10-CM | POA: Diagnosis not present

## 2017-05-02 DIAGNOSIS — J449 Chronic obstructive pulmonary disease, unspecified: Secondary | ICD-10-CM | POA: Insufficient documentation

## 2017-05-02 DIAGNOSIS — Z7982 Long term (current) use of aspirin: Secondary | ICD-10-CM | POA: Diagnosis not present

## 2017-05-02 DIAGNOSIS — R079 Chest pain, unspecified: Secondary | ICD-10-CM | POA: Diagnosis present

## 2017-05-02 DIAGNOSIS — R0789 Other chest pain: Secondary | ICD-10-CM | POA: Diagnosis not present

## 2017-05-02 DIAGNOSIS — N189 Chronic kidney disease, unspecified: Secondary | ICD-10-CM | POA: Diagnosis not present

## 2017-05-02 DIAGNOSIS — I130 Hypertensive heart and chronic kidney disease with heart failure and stage 1 through stage 4 chronic kidney disease, or unspecified chronic kidney disease: Secondary | ICD-10-CM | POA: Insufficient documentation

## 2017-05-02 DIAGNOSIS — E1122 Type 2 diabetes mellitus with diabetic chronic kidney disease: Secondary | ICD-10-CM | POA: Insufficient documentation

## 2017-05-02 LAB — CBC
HEMATOCRIT: 41.2 % (ref 35.0–47.0)
Hemoglobin: 13.4 g/dL (ref 12.0–16.0)
MCH: 29.4 pg (ref 26.0–34.0)
MCHC: 32.4 g/dL (ref 32.0–36.0)
MCV: 90.8 fL (ref 80.0–100.0)
Platelets: 250 10*3/uL (ref 150–440)
RBC: 4.54 MIL/uL (ref 3.80–5.20)
RDW: 14.5 % (ref 11.5–14.5)
WBC: 8.3 10*3/uL (ref 3.6–11.0)

## 2017-05-02 LAB — BASIC METABOLIC PANEL
Anion gap: 10 (ref 5–15)
BUN: 24 mg/dL — ABNORMAL HIGH (ref 6–20)
CO2: 29 mmol/L (ref 22–32)
Calcium: 9.2 mg/dL (ref 8.9–10.3)
Chloride: 101 mmol/L (ref 101–111)
Creatinine, Ser: 1.24 mg/dL — ABNORMAL HIGH (ref 0.44–1.00)
GFR calc Af Amer: 49 mL/min — ABNORMAL LOW (ref >60)
GFR, EST NON AFRICAN AMERICAN: 42 mL/min — AB (ref >60)
GLUCOSE: 221 mg/dL — AB (ref 65–99)
Potassium: 4.8 mmol/L (ref 3.5–5.1)
Sodium: 140 mmol/L (ref 135–145)

## 2017-05-02 LAB — TROPONIN I
Troponin I: <0.03 ng/mL (ref <0.03)
Troponin I: <0.03 ng/mL

## 2017-05-02 LAB — BRAIN NATRIURETIC PEPTIDE: B Natriuretic Peptide: 45 pg/mL (ref 0.0–100.0)

## 2017-05-02 NOTE — ED Notes (Signed)
Pt cleaned and new undergarment applied. Pt transferred to EMS stretcher.

## 2017-05-02 NOTE — ED Notes (Addendum)
Spoke with son who states she is now wheelchair bound and that [facility] uses a lift to move pt. Son is unable to transport due to pt mobility and risk for falls. Report given to [facility].

## 2017-05-02 NOTE — ED Provider Notes (Signed)
[name] Emergency Department Provider Note ____________________________________________   [name]    (approximate)  I have reviewed the triage vital signs and the nursing notes.   HISTORY  Chief Complaint Chest Pain  History of present illness severely limited due to altered mental status/poor historian  HPI ELIA NUNLEY is a 72 y.o. female with past medical history as noted below including schizophrenia, CAD, CHF, and COPD who presents with chest pain, unclear onset, associated with pain in her right arm and her right leg.  Patient was unable to give me any other relevant history.  Past Medical History:  Diagnosis Date  . Anxiety   . Arthritis   . Bronchitis   . Bursitis   . CAD (coronary artery disease)   . CHF (congestive heart failure) ([place])   . Chronic cough   . Chronic kidney disease    shadow on x-ray  . COPD (chronic obstructive pulmonary disease) ([place])   . Depression   . DM2 (diabetes mellitus, type 2) ([place])   . Environmental allergies   . GERD (gastroesophageal reflux disease)   . Hypercholesteremia   . Hypertension   . Left ventricular outflow tract obstruction    a. echo 03/2014: EF 60-65%, hypernamic LV systolic fxn, mod LVH w/ LVOT gradient estimated at 68 mm Hg w/ valsalva, very small LV internal cavity size, mildly increased LV posterior wall thickness, mild Ao valve scl w/o stenosis, diastolic dysfunction, normal RVSP  . Lower extremity edema   . LVH (left ventricular hypertrophy)    a. echo suggests long standing uncontrolled htn. she will not do well when dehydrated, LV cavity obliteration  . Muscle weakness   . Obesity   . On supplemental oxygen therapy    AS NEEDED  . OSA (obstructive sleep apnea)    does not use machine  . Osteoarthritis   . Schizophrenia ([place])   . Tremors of nervous system   . Wheezing     Patient Active Problem List   Diagnosis Date Noted  . UTI (urinary tract infection) 01/16/2016  . Altered  mental status 01/16/2016  . Metabolic encephalopathy   . Acute on chronic respiratory failure with hypoxia and hypercapnia ([place]) 10/13/2015  . Involuntary commitment 09/02/2015  . Schizoaffective disorder, bipolar type ([place])   . Urinary incontinence 10/30/2014  . GERD (gastroesophageal reflux disease) 10/30/2014  . OSA (obstructive sleep apnea) 10/30/2014  . Osteoarthrosis, unspecified whether generalized or localized, involving lower leg 10/30/2014  . COPD (chronic obstructive pulmonary disease) ([place]) 10/30/2014  . Chronic diastolic CHF (congestive heart failure) ([place]) 05/15/2014  . Morbid obesity ([place])   . History of colon polyps 03/09/2013  . Renal mass 06/26/2011  . Lytic bone lesion of hip 06/25/2011  . Hypertension 06/24/2011  . Diabetes mellitus ([place]) 06/24/2011    Past Surgical History:  Procedure Laterality Date  . CATARACT EXTRACTION W/PHACO Left 09/10/2014   Procedure: CATARACT EXTRACTION PHACO AND INTRAOCULAR LENS PLACEMENT (IOC);  Surgeon: Birder Robson, MD;  Location: [facility] ORS;  Service: Ophthalmology;  Laterality: Left;  [place]: 00:44 AP%: 22.8 CDE: 10.24 Fluid lot #[id number]  . CATARACT EXTRACTION W/PHACO Right 10/15/2014   Procedure: CATARACT EXTRACTION PHACO AND INTRAOCULAR LENS PLACEMENT (IOC);  Surgeon: Birder Robson, MD;  Location: [facility] ORS;  Service: Ophthalmology;  Laterality: Right;  [place]: 00:52 AP:40.1 CDE:11.83 LOT PACK #[id number]  . CHOLECYSTECTOMY    . COLONOSCOPY  04/2011   [facility] per patient incomplete  . EYE SURGERY    . JOINT REPLACEMENT  TKR  . KNEE RECONSTRUCTION, MEDIAL PATELLAR FEMORAL LIGAMENT    . RENAL BIOPSY    . TONSILLECTOMY    . TONSILLECTOMY      Prior to Admission medications   Medication Sig Start Date End Date Taking? Authorizing Provider  Amino Acids-Protein Hydrolys (FEEDING SUPPLEMENT, PRO-STAT SUGAR FREE 64,) LIQD Take 30 mLs by mouth 2 (two) times daily.   Yes [provider]  amLODipine (NORVASC) 10 MG tablet Take  10 mg by mouth daily.   Yes [provider]  aspirin EC 81 MG tablet Take 81 mg by mouth daily.   Yes [provider]  atorvastatin (LIPITOR) 20 MG tablet Take 20 mg by mouth at bedtime.   Yes [provider]  carvedilol (COREG) 25 MG tablet Take 1 tablet (25 mg total) by mouth 2 (two) times daily. Patient taking differently: Take 12.5 mg by mouth 2 (two) times daily.  01/21/16  Yes Annita Brod, MD  cholecalciferol (VITAMIN D) 1000 units tablet Take 1,000 Units by mouth daily.   Yes [provider]  dicyclomine (BENTYL) 20 MG tablet Take 20 mg by mouth 3 (three) times daily.   Yes [provider]  divalproex (DEPAKOTE) 500 MG DR tablet Take 2 tablets (1,000 mg total) by mouth at bedtime. Patient taking differently: Take 500 mg by mouth 3 (three) times daily.  01/21/16  Yes Annita Brod, MD  docusate sodium (COLACE) 100 MG capsule Take 200 mg by mouth daily.   Yes [provider]  fluPHENAZine (PROLIXIN) 5 MG/ML solution Take 5 mg by mouth 3 (three) times daily.   Yes [provider]  fluPHENAZine Decanoate LIQD Take 25 mg by mouth every 14 (fourteen) days. MONDAYS   Yes [provider]  Fluticasone-Salmeterol (ADVAIR) 250-50 MCG/DOSE AEPB Inhale 1 puff into the lungs 2 (two) times daily.   Yes [provider]  furosemide (LASIX) 40 MG tablet Take 1 tablet (40 mg total) by mouth daily. 01/22/16  Yes Annita Brod, MD  glipiZIDE (GLUCOTROL) 10 MG tablet Take 1 tablet by mouth daily. 04/06/17  Yes [provider]  hydrALAZINE (APRESOLINE) 50 MG tablet Take 1 tablet by mouth 3 (three) times daily.  04/08/17  Yes [provider]  insulin aspart (NOVOLOG) 100 UNIT/ML injection Inject 0-10 Units into the skin 4 (four) times daily -  before meals and at bedtime.    Yes [provider]  insulin glargine (LANTUS) 100 UNIT/ML injection Inject 11 Units into the skin at bedtime.    Yes  [provider]  JANUVIA 100 MG tablet Take 1 tablet by mouth daily. 04/03/17  Yes [provider]  levothyroxine (SYNTHROID, LEVOTHROID) 75 MCG tablet Take 1 tablet by mouth daily. 04/05/17  Yes [provider]  lisinopril (PRINIVIL,ZESTRIL) 40 MG tablet Take 1 tablet (40 mg total) by mouth at bedtime. 07/03/16  Yes Rankin, Shuvon B, NP  LORazepam (ATIVAN) 0.5 MG tablet Take 1 tablet by mouth at bedtime.  04/19/17  Yes [provider]  Multiple Vitamin (MULTIVITAMIN WITH MINERALS) TABS tablet Take 1 tablet by mouth daily.   Yes [provider]  potassium chloride SA (K-DUR,KLOR-CON) 20 MEQ tablet Take 20 mEq by mouth daily.   Yes [provider]  ranitidine (ZANTAC) 150 MG tablet Take 150 mg by mouth 2 (two) times daily.   Yes [provider]  tiotropium (SPIRIVA) 18 MCG inhalation capsule Place 1 capsule (18 mcg total) into inhaler and inhale daily. 07/02/14  Yes [name], MD  traZODone (DESYREL) 100 MG tablet Take 1 tablet by mouth daily. 04/12/17  Yes [provider]  acetaminophen (TYLENOL) 325 MG tablet Take 2 tablets (650 mg total) by mouth every 4 (four) hours as needed for mild pain (temp > 101.5). 10/20/15   Gladstone Lighter, MD  cephALEXin (KEFLEX) 500 MG capsule Take 2 capsules (1,000 mg total) by mouth 2 (two) times daily. Patient not taking: Reported on 05/02/2017 07/03/16   Rankin, Shuvon B, NP  guaiFENesin (ROBITUSSIN) 100 MG/5ML SOLN Take 15 mLs by mouth every 6 (six) hours as needed for cough or to loosen phlegm. *Notify physician if cough persists more than 3 days*    [provider]  magnesium hydroxide (MILK OF MAGNESIA) 400 MG/5ML suspension Take 30 mLs by mouth daily as needed for mild constipation.    [provider]  metoCLOPramide (REGLAN) 10 MG tablet Take 1 tablet (10 mg total) by mouth every 6 (six) hours as needed for nausea or vomiting. Patient not taking: Reported on 05/02/2017  11/30/14   Orbie Pyo, MD  nitroGLYCERIN (NITROSTAT) 0.4 MG SL tablet Place 0.4 mg under the tongue every 5 (five) minutes as needed for chest pain.    [provider]  Skin Protectants, Misc. (EUCERIN) cream Apply 1 application topically daily.    [provider]    Allergies Haldol [haloperidol decanoate]; Metformin; Prednisone; and Raspberry  Family History  Problem Relation Age of Onset  . Heart attack Mother   . Colon cancer Neg Hx   . Liver disease Neg Hx     Social History Social History   Tobacco Use  . Smoking status: Former Smoker    Packs/day: 3.00    Years: 40.00    Pack years: 120.00  . Smokeless tobacco: Never Used  . Tobacco comment: quit in 2009  Substance Use Topics  . Alcohol use: No    Comment: occ.  . Drug use: No    Review of Systems Level V caveat: Unable to obtain complete review of systems due to altered mental status/poor historian    ____________________________________________   PHYSICAL EXAM:  VITAL SIGNS: ED Triage Vitals  Enc Vitals Group     BP 05/02/17 1509 (!) 143/76     Pulse Rate 05/02/17 1509 66     Resp 05/02/17 1509 20     Temp 05/02/17 1509 97.8 F (36.6 C)     Temp Source 05/02/17 1509 Oral     SpO2 05/02/17 1509 94 %     Weight 05/02/17 1512 270 lb (122.5 kg)     Height 05/02/17 1512 5\' 4"  (1.626 m)     Head Circumference --      Peak Flow --      Pain Score 05/02/17 1512 6     Pain Loc --      Pain Edu? --      Excl. in [place]? --     Constitutional: Somewhat somnolent but arousable.  Answering questions but then falling asleep repeatedly during my attempted history and exam. Eyes: Conjunctivae are normal.  Head: Atraumatic. Nose: No congestion/rhinnorhea. Mouth/Throat: Mucous membranes are slightly dry.   Neck: Normal range of motion.  Cardiovascular: Normal rate, regular rhythm. Grossly normal heart sounds.  Good peripheral circulation. Respiratory: Normal respiratory effort.  No  retractions.  Somewhat decreased and coarse breath sounds bilaterally.. Gastrointestinal: Soft and nontender. No distention.  Genitourinary: No flank tenderness. Musculoskeletal: No lower extremity edema.  Extremities warm and  well perfused.  Neurologic: Motor intact in all extremities.  Normal speech.  Somewhat somnolent, falling asleep repeatedly during my exam.  Skin:  Skin is warm and dry. No rash noted. Psychiatric: Calm and cooperative.  When awake, answering questions appropriately.  ____________________________________________   LABS (all labs ordered are listed, but only abnormal results are displayed)  Labs Reviewed  BASIC METABOLIC PANEL - Abnormal; Notable for the following components:      Result Value   Glucose, Bld 221 (*)    BUN 24 (*)    Creatinine, Ser 1.24 (*)    GFR calc non Af Amer 42 (*)    GFR calc Af Amer 49 (*)    All other components within normal limits  BLOOD GAS, VENOUS - Abnormal; Notable for the following components:   pCO2, Ven 67 (*)    Bicarbonate 35.3 (*)    Acid-Base Excess 6.9 (*)    All other components within normal limits  CBC  TROPONIN I  BRAIN NATRIURETIC PEPTIDE  TROPONIN I   ____________________________________________  EKG  ED ECG REPORT I, Arta Silence, the attending physician, personally viewed and interpreted this ECG.  Date: 05/02/2017 EKG Time: 1514 Rate: 65 Rhythm: normal sinus rhythm QRS Axis: normal Intervals: normal ST/T Wave abnormalities: T wave flattening laterally Narrative Interpretation: no evidence of acute ischemia; no significant change when compared to EKG of 01/26/2017  ____________________________________________  RADIOLOGY  CXR: No focal infiltrate or other acute findings  ____________________________________________   PROCEDURES  Procedure(s) performed: No  Procedures  Critical Care performed: No ____________________________________________   INITIAL IMPRESSION / ASSESSMENT AND  PLAN / ED COURSE  Pertinent labs & imaging results that were available during my care of the patient were reviewed by me and considered in my medical decision making (see chart for details).  72 year old female with past medical history as noted above presents with chest pain.  The patient was falling asleep during my exam and not really able to give me much relevant history, although was somewhat more awake during her interaction with the RN on arrival.  On exam, the vital signs are normal, neuro exam is nonfocal, lung sounds are slightly decreased bilaterally but the remainder the exam is unremarkable.  I reviewed the past medical records in epic; patient was most recently seen in the ED in December of last year for similar presentation with chest pain, and had negative troponins as well as negative CT angio chest when her d-dimer was elevated.  In terms of the chest pain, it is difficult to characterize based on the patient's poor history but given her cardiac history and multiple risk factors, differential includes ACS, CHF or COPD exacerbation, versus musculoskeletal pain or GERD.  There is no clinical evidence to suggest PE.  Plan: Basic and cardiac labs, chest x-ray, and reassess.  Will likely do troponin x2.  At this time I am not sure why the patient appears so somnolent although she was more awake after her initial arrival via EMS.  Given that the patient is on multiple psychiatric medications including trazodone and Ativan, I suspect this is most likely related to medication side effects.  Other differential includes hypercapnia related to her COPD.  We will continue to monitor her mental status during the workup.  At this time, there is no evidence of acute psychiatric emergency or indication for psychiatric evaluation.    ----------------------------------------- 7:35 PM on 05/02/2017 -----------------------------------------  Patient's lab workup reveals slight hyperglycemia and  minimally elevated creatinine but no  significant change from prior labs.  Her PCO2 is also somewhat elevated and I do not have a baseline or prior to compare with.  However the patient's son is now here and he confirms that the patient is at her baseline mental status, and her slight somnolence is likely related to her medications.  Therefore, there is no evidence for acute COPD exacerbation.  Patient has had negative troponins x2, and remained stable.  She appears comfortable and is complaining of minimal pain at this time.  There is no indication for the ED workup.  The patient is stable for discharge back to her facility.  The son and the patient agreed with the plan.  Return precautions given and they expressed understanding.  ____________________________________________   FINAL CLINICAL IMPRESSION(S) / ED DIAGNOSES  Final diagnoses:  Atypical chest pain      NEW MEDICATIONS STARTED DURING THIS VISIT:  New Prescriptions   No medications on file     Note:  This document was prepared using Dragon voice recognition software and may include unintentional dictation errors.    Arta Silence, MD 05/02/17 [phone]

## 2017-05-02 NOTE — ED Triage Notes (Signed)
Here for left chest pain radiating to left arm. Has had manic episodes but refuses to take prn ativan.  Pain intermittent since last night. ASA by EMS and NTG X 2 at [facility]

## 2017-05-02 NOTE — ED Notes (Signed)
Returned from xray

## 2017-05-11 LAB — BLOOD GAS, VENOUS
Acid-Base Excess: 6.9 mmol/L — ABNORMAL HIGH (ref 0.0–2.0)
Bicarbonate: 35.3 mmol/L — ABNORMAL HIGH (ref 20.0–28.0)
PATIENT TEMPERATURE: 37
PCO2 VEN: 67 mmHg — AB (ref 44.0–60.0)
pH, Ven: 7.33 (ref 7.250–7.430)

## 2017-06-01 ENCOUNTER — Emergency Department: Payer: Medicare HMO

## 2017-06-01 ENCOUNTER — Emergency Department
Admission: EM | Admit: 2017-06-01 | Discharge: 2017-06-01 | Disposition: A | Discharge status: Skilled Nursing Facility | Payer: Medicare HMO | Attending: Student in an Organized Health Care Education/Training Program | Admitting: Student in an Organized Health Care Education/Training Program

## 2017-06-01 ENCOUNTER — Other Ambulatory Visit: Payer: Self-pay

## 2017-06-01 DIAGNOSIS — Z79899 Other long term (current) drug therapy: Secondary | ICD-10-CM | POA: Insufficient documentation

## 2017-06-01 DIAGNOSIS — R109 Unspecified abdominal pain: Secondary | ICD-10-CM

## 2017-06-01 DIAGNOSIS — J449 Chronic obstructive pulmonary disease, unspecified: Secondary | ICD-10-CM | POA: Insufficient documentation

## 2017-06-01 DIAGNOSIS — E1122 Type 2 diabetes mellitus with diabetic chronic kidney disease: Secondary | ICD-10-CM | POA: Diagnosis not present

## 2017-06-01 DIAGNOSIS — R1031 Right lower quadrant pain: Secondary | ICD-10-CM | POA: Diagnosis present

## 2017-06-01 DIAGNOSIS — I2510 Atherosclerotic heart disease of native coronary artery without angina pectoris: Secondary | ICD-10-CM | POA: Diagnosis not present

## 2017-06-01 DIAGNOSIS — I5032 Chronic diastolic (congestive) heart failure: Secondary | ICD-10-CM | POA: Insufficient documentation

## 2017-06-01 DIAGNOSIS — N189 Chronic kidney disease, unspecified: Secondary | ICD-10-CM | POA: Diagnosis not present

## 2017-06-01 DIAGNOSIS — N2889 Other specified disorders of kidney and ureter: Secondary | ICD-10-CM | POA: Diagnosis not present

## 2017-06-01 DIAGNOSIS — I130 Hypertensive heart and chronic kidney disease with heart failure and stage 1 through stage 4 chronic kidney disease, or unspecified chronic kidney disease: Secondary | ICD-10-CM | POA: Diagnosis not present

## 2017-06-01 DIAGNOSIS — Z87891 Personal history of nicotine dependence: Secondary | ICD-10-CM | POA: Diagnosis not present

## 2017-06-01 DIAGNOSIS — Z794 Long term (current) use of insulin: Secondary | ICD-10-CM | POA: Insufficient documentation

## 2017-06-01 LAB — COMPREHENSIVE METABOLIC PANEL
ALK PHOS: 74 U/L (ref 38–126)
ALT: 20 U/L (ref 14–54)
ANION GAP: 8 (ref 5–15)
AST: 23 U/L (ref 15–41)
Albumin: 3.7 g/dL (ref 3.5–5.0)
BILIRUBIN TOTAL: 0.5 mg/dL (ref 0.3–1.2)
BUN: 21 mg/dL — ABNORMAL HIGH (ref 6–20)
CALCIUM: 9.7 mg/dL (ref 8.9–10.3)
CO2: 31 mmol/L (ref 22–32)
Chloride: 100 mmol/L — ABNORMAL LOW (ref 101–111)
Creatinine, Ser: 0.94 mg/dL (ref 0.44–1.00)
GFR, EST NON AFRICAN AMERICAN: 59 mL/min — AB (ref >60)
Glucose, Bld: 158 mg/dL — ABNORMAL HIGH (ref 65–99)
Potassium: 4.3 mmol/L (ref 3.5–5.1)
Sodium: 139 mmol/L (ref 135–145)
TOTAL PROTEIN: 8 g/dL (ref 6.5–8.1)

## 2017-06-01 LAB — URINALYSIS, COMPLETE (UACMP) WITH MICROSCOPIC
Bacteria, UA: NONE SEEN
Bilirubin Urine: NEGATIVE
GLUCOSE, UA: NEGATIVE mg/dL
Hgb urine dipstick: NEGATIVE
KETONES UR: NEGATIVE mg/dL
LEUKOCYTES UA: NEGATIVE
Nitrite: NEGATIVE
PH: 6 (ref 5.0–8.0)
Protein, ur: NEGATIVE mg/dL
Specific Gravity, Urine: 1.013 (ref 1.005–1.030)

## 2017-06-01 LAB — CBC WITH DIFFERENTIAL/PLATELET
Basophils Absolute: 0.1 10*3/uL (ref 0–0.1)
Basophils Relative: 1 %
Eosinophils Absolute: 0.1 10*3/uL (ref 0–0.7)
Eosinophils Relative: 1 %
HEMATOCRIT: 42.4 % (ref 35.0–47.0)
HEMOGLOBIN: 14.1 g/dL (ref 12.0–16.0)
LYMPHS ABS: 3.8 10*3/uL — AB (ref 1.0–3.6)
Lymphocytes Relative: 47 %
MCH: 30.2 pg (ref 26.0–34.0)
MCHC: 33.2 g/dL (ref 32.0–36.0)
MCV: 90.9 fL (ref 80.0–100.0)
MONO ABS: 0.9 10*3/uL (ref 0.2–0.9)
Monocytes Relative: 11 %
NEUTROS ABS: 3.2 10*3/uL (ref 1.4–6.5)
NEUTROS PCT: 40 %
Platelets: 295 10*3/uL (ref 150–440)
RBC: 4.66 MIL/uL (ref 3.80–5.20)
RDW: 14.7 % — ABNORMAL HIGH (ref 11.5–14.5)
WBC: 8.1 10*3/uL (ref 3.6–11.0)

## 2017-06-01 MED ORDER — MORPHINE SULFATE (PF) 4 MG/ML IV SOLN
4.0000 mg | INTRAVENOUS | Status: DC | PRN
  Administered 2017-06-01: 4 mg via INTRAVENOUS
  Filled 2017-06-01: qty 1

## 2017-06-01 MED ORDER — LORAZEPAM 0.5 MG PO TABS
0.5000 mg | ORAL_TABLET | Freq: Once | ORAL | Status: AC
  Administered 2017-06-01: 0.5 mg via ORAL
  Filled 2017-06-01: qty 1

## 2017-06-01 MED ORDER — ONDANSETRON HCL 4 MG/2ML IJ SOLN
4.0000 mg | Freq: Once | INTRAMUSCULAR | Status: AC
  Administered 2017-06-01: 4 mg via INTRAVENOUS
  Filled 2017-06-01: qty 2

## 2017-06-01 MED ORDER — DIVALPROEX SODIUM 500 MG PO DR TAB
1000.0000 mg | DELAYED_RELEASE_TABLET | Freq: Once | ORAL | Status: AC
  Administered 2017-06-01: 1000 mg via ORAL
  Filled 2017-06-01: qty 2

## 2017-06-01 NOTE — ED Provider Notes (Signed)
[facility] Emergency Department Provider Note    First MD Initiated Contact with Patient 06/01/17 1734     (approximate)  I have reviewed the triage vital signs and the nursing notes.   HISTORY  Chief Complaint Flank Pain (right)  Level V caveat:  Reported dementia,  schizophrenia HPI [name] [name] is a 72 y.o. female from [facility] with chief complaint of right flank pain radiates from the right flank down into her right groin.  Patient states that she has had it on and off for several months.  EMS states that this is been a chronic and recurring pain that she has.  [facility] did not call EMS the patient herself called EMS.  She is tearful and does appear uncomfortable.  Denies any chest pain or shortness of breath.  No report of any fevers.  History otherwise limited to the patient's underlying dementia and schizophrenia.  Past Medical History:  Diagnosis Date  . Anxiety   . Arthritis   . Bronchitis   . Bursitis   . CAD (coronary artery disease)   . CHF (congestive heart failure) ([place])   . Chronic cough   . Chronic kidney disease    shadow on x-ray  . COPD (chronic obstructive pulmonary disease) ([place])   . Depression   . DM2 (diabetes mellitus, type 2) ([place])   . Environmental allergies   . GERD (gastroesophageal reflux disease)   . Hypercholesteremia   . Hypertension   . Left ventricular outflow tract obstruction    a. echo 03/2014: EF 60-65%, hypernamic LV systolic fxn, mod LVH w/ LVOT gradient estimated at 68 mm Hg w/ valsalva, very small LV internal cavity size, mildly increased LV posterior wall thickness, mild Ao valve scl w/o stenosis, diastolic dysfunction, normal RVSP  . Lower extremity edema   . LVH (left ventricular hypertrophy)    a. echo suggests long standing uncontrolled htn. she will not do well when dehydrated, LV cavity obliteration  . Muscle weakness   . Obesity   . On supplemental oxygen therapy    AS NEEDED  . OSA  (obstructive sleep apnea)    does not use machine  . Osteoarthritis   . Schizophrenia ([place])   . Tremors of nervous system   . Wheezing    Family History  Problem Relation Age of Onset  . Heart attack Mother   . Colon cancer Neg Hx   . Liver disease Neg Hx    Past Surgical History:  Procedure Laterality Date  . CATARACT EXTRACTION W/PHACO Left 09/10/2014   Procedure: CATARACT EXTRACTION PHACO AND INTRAOCULAR LENS PLACEMENT (IOC);  Surgeon: Birder Robson, MD;  Location: [facility] ORS;  Service: Ophthalmology;  Laterality: Left;  [place]: 00:44 AP%: 22.8 CDE: 10.24 Fluid lot #[id number]  . CATARACT EXTRACTION W/PHACO Right 10/15/2014   Procedure: CATARACT EXTRACTION PHACO AND INTRAOCULAR LENS PLACEMENT (IOC);  Surgeon: Birder Robson, MD;  Location: [facility] ORS;  Service: Ophthalmology;  Laterality: Right;  [place]: 00:52 AP:40.1 CDE:11.83 LOT PACK #[id number] H  . CHOLECYSTECTOMY    . COLONOSCOPY  04/2011   [facility] per patient incomplete  . EYE SURGERY    . JOINT REPLACEMENT     TKR  . KNEE RECONSTRUCTION, MEDIAL PATELLAR FEMORAL LIGAMENT    . RENAL BIOPSY    . TONSILLECTOMY    . TONSILLECTOMY     Patient Active Problem List   Diagnosis Date Noted  . UTI (urinary tract infection) 01/16/2016  . Altered mental status 01/16/2016  .  Metabolic encephalopathy   . Acute on chronic respiratory failure with hypoxia and hypercapnia ([place]) 10/13/2015  . Involuntary commitment 09/02/2015  . Schizoaffective disorder, bipolar type ([place])   . Urinary incontinence 10/30/2014  . GERD (gastroesophageal reflux disease) 10/30/2014  . OSA (obstructive sleep apnea) 10/30/2014  . Osteoarthrosis, unspecified whether generalized or localized, involving lower leg 10/30/2014  . COPD (chronic obstructive pulmonary disease) ([place]) 10/30/2014  . Chronic diastolic CHF (congestive heart failure) ([place]) 05/15/2014  . Morbid obesity ([place])   . History of colon polyps 03/09/2013  . Renal mass 06/26/2011  . Lytic bone lesion of hip  06/25/2011  . Hypertension 06/24/2011  . Diabetes mellitus ([place]) 06/24/2011      Prior to Admission medications   Medication Sig Start Date End Date Taking? Authorizing Provider  acetaminophen (TYLENOL) 325 MG tablet Take 2 tablets (650 mg total) by mouth every 4 (four) hours as needed for mild pain (temp > 101.5). 10/20/15   Gladstone Lighter, MD  Amino Acids-Protein Hydrolys (FEEDING SUPPLEMENT, PRO-STAT SUGAR FREE 64,) LIQD Take 30 mLs by mouth 2 (two) times daily.    [provider]  amLODipine (NORVASC) 10 MG tablet Take 10 mg by mouth daily.    [provider]  aspirin EC 81 MG tablet Take 81 mg by mouth daily.    [provider]  atorvastatin (LIPITOR) 20 MG tablet Take 20 mg by mouth at bedtime.    [provider]  carvedilol (COREG) 25 MG tablet Take 1 tablet (25 mg total) by mouth 2 (two) times daily. Patient taking differently: Take 12.5 mg by mouth 2 (two) times daily.  01/21/16   Annita Brod, MD  cephALEXin (KEFLEX) 500 MG capsule Take 2 capsules (1,000 mg total) by mouth 2 (two) times daily. Patient not taking: Reported on 05/02/2017 07/03/16   Rankin, Shuvon B, NP  cholecalciferol (VITAMIN D) 1000 units tablet Take 1,000 Units by mouth daily.    [provider]  dicyclomine (BENTYL) 20 MG tablet Take 20 mg by mouth 3 (three) times daily.    [provider]  divalproex (DEPAKOTE) 500 MG DR tablet Take 2 tablets (1,000 mg total) by mouth at bedtime. Patient taking differently: Take 500 mg by mouth 3 (three) times daily.  01/21/16   Annita Brod, MD  docusate sodium (COLACE) 100 MG capsule Take 200 mg by mouth daily.    [provider]  fluPHENAZine (PROLIXIN) 5 MG/ML solution Take 5 mg by mouth 3 (three) times daily.    [provider]  fluPHENAZine Decanoate LIQD Take 25 mg by mouth every 14 (fourteen) days. MONDAYS    [provider]  Fluticasone-Salmeterol (ADVAIR) 250-50 MCG/DOSE AEPB  Inhale 1 puff into the lungs 2 (two) times daily.    [provider]  furosemide (LASIX) 40 MG tablet Take 1 tablet (40 mg total) by mouth daily. 01/22/16   Annita Brod, MD  glipiZIDE (GLUCOTROL) 10 MG tablet Take 1 tablet by mouth daily. 04/06/17   [provider]  guaiFENesin (ROBITUSSIN) 100 MG/5ML SOLN Take 15 mLs by mouth every 6 (six) hours as needed for cough or to loosen phlegm. *Notify physician if cough persists more than 3 days*    [provider]  hydrALAZINE (APRESOLINE) 50 MG tablet Take 1 tablet by mouth 3 (three) times daily.  04/08/17   [provider]  insulin aspart (NOVOLOG) 100 UNIT/ML injection Inject 0-10 Units into the skin 4 (four) times daily -  before meals and  at bedtime.     [provider]  insulin glargine (LANTUS) 100 UNIT/ML injection Inject 11 Units into the skin at bedtime.     [provider]  JANUVIA 100 MG tablet Take 1 tablet by mouth daily. 04/03/17   [provider]  levothyroxine (SYNTHROID, LEVOTHROID) 75 MCG tablet Take 1 tablet by mouth daily. 04/05/17   [provider]  lisinopril (PRINIVIL,ZESTRIL) 40 MG tablet Take 1 tablet (40 mg total) by mouth at bedtime. 07/03/16   Rankin, Shuvon B, NP  LORazepam (ATIVAN) 0.5 MG tablet Take 1 tablet by mouth at bedtime.  04/19/17   [provider]  magnesium hydroxide (MILK OF MAGNESIA) 400 MG/5ML suspension Take 30 mLs by mouth daily as needed for mild constipation.    [provider]  metoCLOPramide (REGLAN) 10 MG tablet Take 1 tablet (10 mg total) by mouth every 6 (six) hours as needed for nausea or vomiting. Patient not taking: Reported on 05/02/2017 11/30/14   Orbie Pyo, MD  Multiple Vitamin (MULTIVITAMIN WITH MINERALS) TABS tablet Take 1 tablet by mouth daily.    [provider]  nitroGLYCERIN (NITROSTAT) 0.4 MG SL tablet Place 0.4 mg under the tongue every 5 (five) minutes as needed for chest pain.     [provider]  potassium chloride SA (K-DUR,KLOR-CON) 20 MEQ tablet Take 20 mEq by mouth daily.    [provider]  ranitidine (ZANTAC) 150 MG tablet Take 150 mg by mouth 2 (two) times daily.    [provider]  Skin Protectants, Misc. (EUCERIN) cream Apply 1 application topically daily.    [provider]  tiotropium (SPIRIVA) 18 MCG inhalation capsule Place 1 capsule (18 mcg total) into inhaler and inhale daily. 07/02/14   [name], MD  traZODone (DESYREL) 100 MG tablet Take 1 tablet by mouth daily. 04/12/17   [provider]    Allergies Haldol [haloperidol decanoate]; Metformin; Prednisone; and Raspberry    Social History Social History   Tobacco Use  . Smoking status: Former Smoker    Packs/day: 3.00    Years: 40.00    Pack years: 120.00  . Smokeless tobacco: Never Used  . Tobacco comment: quit in 2009  Substance Use Topics  . Alcohol use: No    Comment: occ.  . Drug use: No    Review of Systems Patient denies headaches, rhinorrhea, blurry vision, numbness, shortness of breath, chest pain, edema, cough, abdominal pain, nausea, vomiting, diarrhea, dysuria, fevers, rashes or hallucinations unless otherwise stated above in HPI. ____________________________________________   PHYSICAL EXAM:  VITAL SIGNS: Vitals:   06/01/17 2115 06/01/17 2130  BP:    Pulse: 64 72  Resp:    Temp:    SpO2: 98% 97%    Constitutional: Alert and oriented. uncomfortable but non-toxic appearing and in no acute distress. Eyes: Conjunctivae are normal.  Head: Atraumatic. Nose: No congestion/rhinnorhea. Mouth/Throat: Mucous membranes are moist.   Neck: No stridor. Painless ROM.  Cardiovascular: Normal rate, regular rhythm. Grossly normal heart sounds.  Good peripheral circulation. Respiratory: Normal respiratory effort.  No retractions. Lungs CTAB. Gastrointestinal: Soft and nontender. No distention. No abdominal bruits. No CVA  tenderness. Genitourinary:  Musculoskeletal: No lower extremity tenderness nor edema.  No joint effusions. Neurologic:  Normal speech and language. No gross focal neurologic deficits are appreciated. No facial droop Skin:  Skin is warm, dry and intact. No rash noted. Psychiatric: Mood and affect are normal. Speech and behavior are normal.  ____________________________________________   LABS (  all labs ordered are listed, but only abnormal results are displayed)  Results for orders placed or performed during the hospital encounter of 06/01/17 (from the past 24 hour(s))  CBC with Differential/Platelet     Status: Abnormal   Collection Time: 06/01/17  5:54 PM  Result Value Ref Range   WBC 8.1 3.6 - 11.0 K/uL   RBC 4.66 3.80 - 5.20 MIL/uL   Hemoglobin 14.1 12.0 - 16.0 g/dL   HCT 42.4 35.0 - 47.0 %   MCV 90.9 80.0 - 100.0 fL   MCH 30.2 26.0 - 34.0 pg   MCHC 33.2 32.0 - 36.0 g/dL   RDW 14.7 (H) 11.5 - 14.5 %   Platelets 295 150 - 440 K/uL   Neutrophils Relative % 40 %   Neutro Abs 3.2 1.4 - 6.5 K/uL   Lymphocytes Relative 47 %   Lymphs Abs 3.8 (H) 1.0 - 3.6 K/uL   Monocytes Relative 11 %   Monocytes Absolute 0.9 0.2 - 0.9 K/uL   Eosinophils Relative 1 %   Eosinophils Absolute 0.1 0 - 0.7 K/uL   Basophils Relative 1 %   Basophils Absolute 0.1 0 - 0.1 K/uL  Comprehensive metabolic panel     Status: Abnormal   Collection Time: 06/01/17  5:54 PM  Result Value Ref Range   Sodium 139 135 - 145 mmol/L   Potassium 4.3 3.5 - 5.1 mmol/L   Chloride 100 (L) 101 - 111 mmol/L   CO2 31 22 - 32 mmol/L   Glucose, Bld 158 (H) 65 - 99 mg/dL   BUN 21 (H) 6 - 20 mg/dL   Creatinine, Ser 0.94 0.44 - 1.00 mg/dL   Calcium 9.7 8.9 - 10.3 mg/dL   Total Protein 8.0 6.5 - 8.1 g/dL   Albumin 3.7 3.5 - 5.0 g/dL   AST 23 15 - 41 U/L   ALT 20 14 - 54 U/L   Alkaline Phosphatase 74 38 - 126 U/L   Total Bilirubin 0.5 0.3 - 1.2 mg/dL   GFR calc non Af Amer 59 (L) >60 mL/min   GFR calc Af Amer >60 >60 mL/min    Anion gap 8 5 - 15  Urinalysis, Complete w Microscopic     Status: Abnormal   Collection Time: 06/01/17  5:54 PM  Result Value Ref Range   Color, Urine YELLOW (A) YELLOW   APPearance CLEAR (A) CLEAR   Specific Gravity, Urine 1.013 1.005 - 1.030   pH 6.0 5.0 - 8.0   Glucose, UA NEGATIVE NEGATIVE mg/dL   Hgb urine dipstick NEGATIVE NEGATIVE   Bilirubin Urine NEGATIVE NEGATIVE   Ketones, ur NEGATIVE NEGATIVE mg/dL   Protein, ur NEGATIVE NEGATIVE mg/dL   Nitrite NEGATIVE NEGATIVE   Leukocytes, UA NEGATIVE NEGATIVE   RBC / HPF 0-5 0 - 5 RBC/hpf   WBC, UA 0-5 0 - 5 WBC/hpf   Bacteria, UA NONE SEEN NONE SEEN   Squamous Epithelial / LPF 0-5 (A) NONE SEEN   ____________________________________________  __________________________________  RADIOLOGY  I personally reviewed all radiographic images ordered to evaluate for the above acute complaints and reviewed radiology reports and findings.  These findings were personally discussed with the patient.  Please see medical record for radiology report.  ____________________________________________   PROCEDURES  Procedure(s) performed:  Procedures    Critical Care performed: no ____________________________________________   INITIAL IMPRESSION / ASSESSMENT AND PLAN / ED COURSE  Pertinent labs & imaging results that were available during my care of the patient were reviewed  by me and considered in my medical decision making (see chart for details).  DDX: Stone, mass, Pilo, muscular skeletal strain, spasm  KEONI HAVEY is a 72 y.o. who presents to the ED with right flank pain as described above.  Does seem to be a chronic component to this patient does have known renal disease as well as kidney stones.  Will evaluate for evidence of pyelonephritis or stone.  We will check blood work.  Will provide IV and oral pain medication and reassess.  Clinical Course as of Jun 01 2208  Wed Jun 01, 2017  1937 CT imaging shows no evidence of  stone but there does appear to be an enlarging right renal mass that has been followed by outpatient urology at [facility] and seems to be a known entity.  May be a source of the patient's pain.  Do not feel that MRI or CT additional imaging clinically indicated at this time.   [PR]  2201 Urinalysis shows no evidence of infection.  Patient resting comfortably.  Stable and appropriate for discharge home.   [PR]    Clinical Course User Index [PR] Merlyn Lot, MD     As part of my medical decision making, I reviewed the following data within the [facility] notes reviewed and incorporated, Labs reviewed, notes from prior ED visits and [place] Controlled Substance Database   ____________________________________________   FINAL CLINICAL IMPRESSION(S) / ED DIAGNOSES  Final diagnoses:  Right flank pain  Renal mass, right      NEW MEDICATIONS STARTED DURING THIS VISIT:  New Prescriptions   No medications on file     Note:  This document was prepared using Dragon voice recognition software and may include unintentional dictation errors.    Merlyn Lot, MD 06/01/17 2210

## 2017-06-01 NOTE — ED Notes (Addendum)
Pt. Son verbalizes understanding of d/c instructions and follow-up. D/C info also reviewed with Sabrina at [facility] via telephone who verbalizes understanding. VS stable and pain controlled per pt sleeping comfortable in stretcher.  Pt. In NAD at time of d/c and son denies further concerns regarding this visit. Pt. Stable at the time of departure from the unit, departing unit by the safest and most appropriate manner per that pt condition and limitations with all belongings accounted for. Pt caretakers advised to return to the ED at any time for emergent concerns, or for new/worsening symptoms.   [facility] and Son state transport back to facility by EMS

## 2017-06-01 NOTE — ED Triage Notes (Signed)
Pt presents via [facility] from [facility] in [place]. EMS stated pt complains of R Lower Flank Pain that has been persisting for 7 months. Pt states "pain is 10/10 and they were not doing anything to help me."

## 2017-06-01 NOTE — ED Notes (Signed)
Pt placed on 2L O2 d/t hypoxia while sleeping

## 2017-06-01 NOTE — ED Notes (Signed)
[facility] called to transport back to [facility] [address]

## 2017-06-01 NOTE — ED Notes (Signed)
Pt removed from bedpan, but urine was not caught in pan. External cath placed by this RN and Judson Roch EDT

## 2017-06-01 NOTE — ED Notes (Signed)
Pt departed with [facility]

## 2017-06-01 NOTE — Discharge Instructions (Addendum)
Follow-up with your regular physician.  You also need to follow-up with urology and I have given you a referral to Dr. Ernest Mallick office regarding the mass on her right kidney.  This has been present for quite some time but may be the source of your pain.

## 2017-06-02 ENCOUNTER — Other Ambulatory Visit: Payer: Self-pay | Admitting: Family Medicine

## 2017-06-02 DIAGNOSIS — R1312 Dysphagia, oropharyngeal phase: Secondary | ICD-10-CM

## 2017-06-22 ENCOUNTER — Ambulatory Visit
Admission: RE | Admit: 2017-06-22 | Discharge: 2017-06-22 | Disposition: A | Discharge status: Home / Self Care | Payer: Medicare HMO | Source: Ambulatory Visit | Attending: Family Medicine | Admitting: Family Medicine

## 2017-06-22 DIAGNOSIS — R1312 Dysphagia, oropharyngeal phase: Secondary | ICD-10-CM | POA: Insufficient documentation

## 2017-06-22 DIAGNOSIS — E119 Type 2 diabetes mellitus without complications: Secondary | ICD-10-CM | POA: Insufficient documentation

## 2017-06-22 DIAGNOSIS — I5032 Chronic diastolic (congestive) heart failure: Secondary | ICD-10-CM | POA: Diagnosis not present

## 2017-06-22 DIAGNOSIS — I110 Hypertensive heart disease with heart failure: Secondary | ICD-10-CM | POA: Insufficient documentation

## 2017-06-22 DIAGNOSIS — J449 Chronic obstructive pulmonary disease, unspecified: Secondary | ICD-10-CM | POA: Insufficient documentation

## 2017-06-22 NOTE — Therapy (Signed)
[facility] [place], [place], [address] Phone: [phone]   Fax:     Modified Barium Swallow  Patient Details  Name: PATTIANN SOLANKI MRN: [id number] Date of Birth: 1946/02/17 No data recorded  Encounter Date: 06/22/2017  End of Session - 06/22/17 1334    Visit Number  1    Number of Visits  1    Date for SLP Re-Evaluation  06/22/17       Past Medical History:  Diagnosis Date  . Anxiety   . Arthritis   . Bronchitis   . Bursitis   . CAD (coronary artery disease)   . CHF (congestive heart failure) ([place])   . Chronic cough   . Chronic kidney disease    shadow on x-ray  . COPD (chronic obstructive pulmonary disease) ([place])   . Depression   . DM2 (diabetes mellitus, type 2) ([place])   . Environmental allergies   . GERD (gastroesophageal reflux disease)   . Hypercholesteremia   . Hypertension   . Left ventricular outflow tract obstruction    a. echo 03/2014: EF 60-65%, hypernamic LV systolic fxn, mod LVH w/ LVOT gradient estimated at 68 mm Hg w/ valsalva, very small LV internal cavity size, mildly increased LV posterior wall thickness, mild Ao valve scl w/o stenosis, diastolic dysfunction, normal RVSP  . Lower extremity edema   . LVH (left ventricular hypertrophy)    a. echo suggests long standing uncontrolled htn. she will not do well when dehydrated, LV cavity obliteration  . Muscle weakness   . Obesity   . On supplemental oxygen therapy    AS NEEDED  . OSA (obstructive sleep apnea)    does not use machine  . Osteoarthritis   . Schizophrenia ([place])   . Tremors of nervous system   . Wheezing     Past Surgical History:  Procedure Laterality Date  . CATARACT EXTRACTION W/PHACO Left 09/10/2014   Procedure: CATARACT EXTRACTION PHACO AND INTRAOCULAR LENS PLACEMENT (IOC);  Surgeon: Birder Robson, MD;  Location: [facility] ORS;  Service: Ophthalmology;  Laterality: Left;  [place]: 00:44 AP%: 22.8 CDE: 10.24 Fluid  lot #[id number]  . CATARACT EXTRACTION W/PHACO Right 10/15/2014   Procedure: CATARACT EXTRACTION PHACO AND INTRAOCULAR LENS PLACEMENT (IOC);  Surgeon: Birder Robson, MD;  Location: [facility] ORS;  Service: Ophthalmology;  Laterality: Right;  [place]: 00:52 AP:40.1 CDE:11.83 LOT PACK #[id number]  . CHOLECYSTECTOMY    . COLONOSCOPY  04/2011   [facility] per patient incomplete  . EYE SURGERY    . JOINT REPLACEMENT     TKR  . KNEE RECONSTRUCTION, MEDIAL PATELLAR FEMORAL LIGAMENT    . RENAL BIOPSY    . TONSILLECTOMY    . TONSILLECTOMY      There were no vitals filed for this visit.   Subjective: Patient behavior: (alertness, ability to follow instructions, etc.):  Patient was snoring while waiting for staff to get ready for study, she easily roused and participated in the study.  Chief complaint: Patient could not relay her swallowing complaints.   Objective:  Radiological Procedure: A videoflouroscopic evaluation of oral-preparatory, reflex initiation, and pharyngeal phases of the swallow was performed; as well as a screening of the upper esophageal phase.  I. POSTURE: Upright in MBS chair.  Patient could not bring cup to her mouth; all liquid swallows via straw  II. VIEW: Lateral  III. COMPENSATORY STRATEGIES: N/A  IV. BOLUSES ADMINISTERED:   Thin Liquid: 3 small rapid consecutive via straw, 1 larger  pull via straw   Nectar-thick Liquid: 3 rapid consecutive via straw   Honey-thick Liquid: DNT   Puree: 2 teaspoon presentations   Mechanical Soft: 1/4 graham cracker in applesauce  V. RESULTS OF EVALUATION: A. ORAL PREPARATORY PHASE: (The lips, tongue, and velum are observed for strength and coordination)       **Overall Severity Rating: Mild; inconsistent lingual pumping/disorganized posterior movement, oral coating post swallow  B. SWALLOW INITIATION/REFLEX: (The reflex is normal if "triggered" by the time the bolus reached the base of the tongue)  **Overall Severity Rating: Mild; Most  swallows trigger at the valleculae.  The larger thin liquid bolus triggered while falling from the valleculae to the pyriform sinuses  C. PHARYNGEAL PHASE: (Pharyngeal function is normal if the bolus shows rapid, smooth, and continuous transit through the pharynx and there is no pharyngeal residue after the swallow)  **Overall Severity Rating: Minimal; trace vallecular residue post swallow  D. LARYNGEAL PENETRATION: (Material entering into the laryngeal inlet/vestibule but not aspirated) None  E. ASPIRATION: None  F. ESOPHAGEAL PHASE: (Screening of the upper esophagus): could not view secondary shoulder shadow  ASSESSMENT: This 72 year old nursing home resident is presenting with mild oral dysphagia characterized by inconsistent lingual pumping and trace oral residue post swallow with minimal pharyngeal dysphagia characterized by delayed pharyngeal swallow initiation and minimal residue in the valleculae. There was no observed laryngeal penetration or aspiration. The patient does not appear to be risk for prandial aspiration.  The patient had one cough, between trials and flouro off.  It is possible some of the oral/vallecular residue dripped into the airway, triggering a cough.  The patient had a clear CXR 05/02/2017, indicating she is maintaining pulmonary health.  PLAN/RECOMMENDATIONS:   A. Diet: usual diet   B. Swallowing Precautions: Standard   C. Recommended consultation to: N/A   D. Therapy recommendations: patient/staff education as needed   E. Results and recommendations were discussed with the patient immediately following the study and the final report routed to the referring MD and treating SLP.    Patient will benefit from skilled therapeutic intervention in order to improve the following deficits and impairments:   Oropharyngeal dysphagia - Plan: DG OP Swallowing Func-Medicare/Speech Path, DG OP Swallowing Func-Medicare/Speech Path        Problem List Patient Active  Problem List   Diagnosis Date Noted  . UTI (urinary tract infection) 01/16/2016  . Altered mental status 01/16/2016  . Metabolic encephalopathy   . Acute on chronic respiratory failure with hypoxia and hypercapnia ([place]) 10/13/2015  . Involuntary commitment 09/02/2015  . Schizoaffective disorder, bipolar type ([place])   . Urinary incontinence 10/30/2014  . GERD (gastroesophageal reflux disease) 10/30/2014  . OSA (obstructive sleep apnea) 10/30/2014  . Osteoarthrosis, unspecified whether generalized or localized, involving lower leg 10/30/2014  . COPD (chronic obstructive pulmonary disease) ([place]) 10/30/2014  . Chronic diastolic CHF (congestive heart failure) ([place]) 05/15/2014  . Morbid obesity ([place])   . History of colon polyps 03/09/2013  . Renal mass 06/26/2011  . Lytic bone lesion of hip 06/25/2011  . Hypertension 06/24/2011  . Diabetes mellitus ([place]) 06/24/2011   Leroy Sea, MS/CCC- SLP  Lou Miner 06/22/2017, 1:35 PM  [facility] [place], [place], [address] Phone: [phone]   Fax:     Name: CRESCENT GOTHAM MRN: [id number] Date of Birth: 1945-09-24

## 2017-06-24 ENCOUNTER — Ambulatory Visit: Payer: Medicare HMO | Attending: Neurology

## 2017-06-24 DIAGNOSIS — G4733 Obstructive sleep apnea (adult) (pediatric): Secondary | ICD-10-CM | POA: Diagnosis present

## 2017-07-27 ENCOUNTER — Observation Stay
Admission: EM | Admit: 2017-07-27 | Discharge: 2017-08-16 | Disposition: A | Discharge status: Skilled Nursing Facility | Payer: Medicare HMO | Attending: Internal Medicine | Admitting: Internal Medicine

## 2017-07-27 ENCOUNTER — Encounter: Payer: Self-pay | Admitting: Emergency Medicine

## 2017-07-27 ENCOUNTER — Other Ambulatory Visit: Payer: Self-pay

## 2017-07-27 DIAGNOSIS — Z86010 Personal history of colonic polyps: Secondary | ICD-10-CM | POA: Insufficient documentation

## 2017-07-27 DIAGNOSIS — Z87891 Personal history of nicotine dependence: Secondary | ICD-10-CM | POA: Insufficient documentation

## 2017-07-27 DIAGNOSIS — I2510 Atherosclerotic heart disease of native coronary artery without angina pectoris: Secondary | ICD-10-CM | POA: Insufficient documentation

## 2017-07-27 DIAGNOSIS — J9621 Acute and chronic respiratory failure with hypoxia: Secondary | ICD-10-CM | POA: Insufficient documentation

## 2017-07-27 DIAGNOSIS — G4733 Obstructive sleep apnea (adult) (pediatric): Secondary | ICD-10-CM | POA: Insufficient documentation

## 2017-07-27 DIAGNOSIS — E7800 Pure hypercholesterolemia, unspecified: Secondary | ICD-10-CM | POA: Diagnosis not present

## 2017-07-27 DIAGNOSIS — Z79890 Hormone replacement therapy (postmenopausal): Secondary | ICD-10-CM | POA: Diagnosis not present

## 2017-07-27 DIAGNOSIS — E039 Hypothyroidism, unspecified: Secondary | ICD-10-CM | POA: Insufficient documentation

## 2017-07-27 DIAGNOSIS — I5032 Chronic diastolic (congestive) heart failure: Secondary | ICD-10-CM | POA: Diagnosis not present

## 2017-07-27 DIAGNOSIS — R0902 Hypoxemia: Secondary | ICD-10-CM

## 2017-07-27 DIAGNOSIS — I130 Hypertensive heart and chronic kidney disease with heart failure and stage 1 through stage 4 chronic kidney disease, or unspecified chronic kidney disease: Secondary | ICD-10-CM | POA: Insufficient documentation

## 2017-07-27 DIAGNOSIS — Z7982 Long term (current) use of aspirin: Secondary | ICD-10-CM | POA: Diagnosis not present

## 2017-07-27 DIAGNOSIS — E1122 Type 2 diabetes mellitus with diabetic chronic kidney disease: Secondary | ICD-10-CM | POA: Diagnosis not present

## 2017-07-27 DIAGNOSIS — N183 Chronic kidney disease, stage 3 (moderate): Secondary | ICD-10-CM | POA: Insufficient documentation

## 2017-07-27 DIAGNOSIS — J449 Chronic obstructive pulmonary disease, unspecified: Secondary | ICD-10-CM | POA: Insufficient documentation

## 2017-07-27 DIAGNOSIS — N289 Disorder of kidney and ureter, unspecified: Secondary | ICD-10-CM

## 2017-07-27 DIAGNOSIS — E875 Hyperkalemia: Secondary | ICD-10-CM | POA: Insufficient documentation

## 2017-07-27 DIAGNOSIS — F419 Anxiety disorder, unspecified: Secondary | ICD-10-CM | POA: Insufficient documentation

## 2017-07-27 DIAGNOSIS — M79602 Pain in left arm: Secondary | ICD-10-CM | POA: Insufficient documentation

## 2017-07-27 DIAGNOSIS — Z888 Allergy status to other drugs, medicaments and biological substances status: Secondary | ICD-10-CM | POA: Insufficient documentation

## 2017-07-27 DIAGNOSIS — Z794 Long term (current) use of insulin: Secondary | ICD-10-CM | POA: Diagnosis not present

## 2017-07-27 DIAGNOSIS — M1990 Unspecified osteoarthritis, unspecified site: Secondary | ICD-10-CM | POA: Insufficient documentation

## 2017-07-27 DIAGNOSIS — Z79899 Other long term (current) drug therapy: Secondary | ICD-10-CM | POA: Insufficient documentation

## 2017-07-27 DIAGNOSIS — R059 Cough, unspecified: Secondary | ICD-10-CM

## 2017-07-27 DIAGNOSIS — I422 Other hypertrophic cardiomyopathy: Secondary | ICD-10-CM | POA: Diagnosis not present

## 2017-07-27 DIAGNOSIS — Z7951 Long term (current) use of inhaled steroids: Secondary | ICD-10-CM | POA: Diagnosis not present

## 2017-07-27 DIAGNOSIS — N179 Acute kidney failure, unspecified: Secondary | ICD-10-CM | POA: Diagnosis not present

## 2017-07-27 DIAGNOSIS — F250 Schizoaffective disorder, bipolar type: Secondary | ICD-10-CM | POA: Diagnosis not present

## 2017-07-27 DIAGNOSIS — F22 Delusional disorders: Secondary | ICD-10-CM | POA: Diagnosis present

## 2017-07-27 DIAGNOSIS — Z87440 Personal history of urinary (tract) infections: Secondary | ICD-10-CM | POA: Insufficient documentation

## 2017-07-27 DIAGNOSIS — R05 Cough: Secondary | ICD-10-CM | POA: Insufficient documentation

## 2017-07-27 DIAGNOSIS — F209 Schizophrenia, unspecified: Secondary | ICD-10-CM

## 2017-07-27 DIAGNOSIS — K219 Gastro-esophageal reflux disease without esophagitis: Secondary | ICD-10-CM | POA: Insufficient documentation

## 2017-07-27 DIAGNOSIS — E785 Hyperlipidemia, unspecified: Secondary | ICD-10-CM | POA: Insufficient documentation

## 2017-07-27 DIAGNOSIS — Z6841 Body Mass Index (BMI) 40.0 and over, adult: Secondary | ICD-10-CM | POA: Insufficient documentation

## 2017-07-27 DIAGNOSIS — Z751 Person awaiting admission to adequate facility elsewhere: Secondary | ICD-10-CM | POA: Insufficient documentation

## 2017-07-27 LAB — COMPREHENSIVE METABOLIC PANEL
ALBUMIN: 3.7 g/dL (ref 3.5–5.0)
ALK PHOS: 74 U/L (ref 38–126)
ALT: 15 U/L (ref 14–54)
AST: 26 U/L (ref 15–41)
Anion gap: 12 (ref 5–15)
BUN: 26 mg/dL — AB (ref 6–20)
CALCIUM: 9.3 mg/dL (ref 8.9–10.3)
CO2: 28 mmol/L (ref 22–32)
CREATININE: 1.27 mg/dL — AB (ref 0.44–1.00)
Chloride: 96 mmol/L — ABNORMAL LOW (ref 101–111)
GFR calc Af Amer: 48 mL/min — ABNORMAL LOW (ref >60)
GFR calc non Af Amer: 41 mL/min — ABNORMAL LOW (ref >60)
GLUCOSE: 104 mg/dL — AB (ref 65–99)
Potassium: 3.6 mmol/L (ref 3.5–5.1)
SODIUM: 136 mmol/L (ref 135–145)
Total Bilirubin: 0.2 mg/dL — ABNORMAL LOW (ref 0.3–1.2)
Total Protein: 8.8 g/dL — ABNORMAL HIGH (ref 6.5–8.1)

## 2017-07-27 LAB — URINE DRUG SCREEN, QUALITATIVE (ARMC ONLY)
Amphetamines, Ur Screen: NOT DETECTED
Barbiturates, Ur Screen: NOT DETECTED
Benzodiazepine, Ur Scrn: NOT DETECTED
CANNABINOID 50 NG, UR ~~LOC~~: NOT DETECTED
Cocaine Metabolite,Ur ~~LOC~~: NOT DETECTED
MDMA (ECSTASY) UR SCREEN: NOT DETECTED
Methadone Scn, Ur: NOT DETECTED
Opiate, Ur Screen: NOT DETECTED
PHENCYCLIDINE (PCP) UR S: NOT DETECTED
Tricyclic, Ur Screen: NOT DETECTED

## 2017-07-27 LAB — ACETAMINOPHEN LEVEL

## 2017-07-27 LAB — URINALYSIS, COMPLETE (UACMP) WITH MICROSCOPIC
Bilirubin Urine: NEGATIVE
Glucose, UA: NEGATIVE mg/dL
Hgb urine dipstick: NEGATIVE
KETONES UR: NEGATIVE mg/dL
Nitrite: NEGATIVE
Protein, ur: NEGATIVE mg/dL
Specific Gravity, Urine: 1.012 (ref 1.005–1.030)
pH: 5 (ref 5.0–8.0)

## 2017-07-27 LAB — CBC
HCT: 44.5 % (ref 35.0–47.0)
Hemoglobin: 14.7 g/dL (ref 12.0–16.0)
MCH: 30.1 pg (ref 26.0–34.0)
MCHC: 33.1 g/dL (ref 32.0–36.0)
MCV: 90.9 fL (ref 80.0–100.0)
PLATELETS: 217 10*3/uL (ref 150–440)
RBC: 4.89 MIL/uL (ref 3.80–5.20)
RDW: 14.5 % (ref 11.5–14.5)
WBC: 9.7 10*3/uL (ref 3.6–11.0)

## 2017-07-27 LAB — ETHANOL: Alcohol, Ethyl (B): <10 mg/dL (ref <10)

## 2017-07-27 LAB — SALICYLATE LEVEL: Salicylate Lvl: <7 mg/dL (ref 2.8–30.0)
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
## 2017-07-27 NOTE — ED Notes (Signed)
SOC machine set up in room.

## 2017-07-27 NOTE — ED Notes (Signed)
Pt. Wanted to know if she could call brother.  Pt. Advised it was after 11 pm and that she would be able to call her brother in morning.  Pt. Ok with response and would like to call brother in the morning.
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
## 2017-07-27 NOTE — ED Notes (Signed)
Pt dressed out at this time, belongings bag contains shirt, pants, shoes, socks, and underweart
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
## 2017-07-28 ENCOUNTER — Encounter: Payer: Self-pay | Admitting: Psychiatry

## 2017-07-28 DIAGNOSIS — F250 Schizoaffective disorder, bipolar type: Secondary | ICD-10-CM | POA: Diagnosis not present

## 2017-07-28 LAB — GLUCOSE, CAPILLARY
GLUCOSE-CAPILLARY: 127 mg/dL — AB (ref 65–99)
GLUCOSE-CAPILLARY: 148 mg/dL — AB (ref 65–99)
GLUCOSE-CAPILLARY: 223 mg/dL — AB (ref 65–99)

## 2017-07-28 LAB — VALPROIC ACID LEVEL: Valproic Acid Lvl: 53 ug/mL (ref 50.0–100.0)

## 2017-07-28 MED ORDER — LEVOTHYROXINE SODIUM 50 MCG PO TABS
75.0000 ug | ORAL_TABLET | Freq: Every day | ORAL | Status: DC
  Administered 2017-07-28 – 2017-08-03 (×7): 75 ug via ORAL
  Filled 2017-07-28 (×2): qty 2
  Filled 2017-07-28: qty 1.5
  Filled 2017-07-28 (×4): qty 2

## 2017-07-28 MED ORDER — CARVEDILOL 3.125 MG PO TABS
12.5000 mg | ORAL_TABLET | Freq: Two times a day (BID) | ORAL | Status: DC
  Administered 2017-07-28 – 2017-08-14 (×33): 12.5 mg via ORAL
  Filled 2017-07-28: qty 2
  Filled 2017-07-28 (×3): qty 4
  Filled 2017-07-28 (×2): qty 2
  Filled 2017-07-28 (×2): qty 4
  Filled 2017-07-28 (×2): qty 2
  Filled 2017-07-28: qty 4
  Filled 2017-07-28: qty 2
  Filled 2017-07-28: qty 4
  Filled 2017-07-28: qty 2
  Filled 2017-07-28 (×2): qty 4
  Filled 2017-07-28 (×2): qty 2
  Filled 2017-07-28: qty 4
  Filled 2017-07-28: qty 2
  Filled 2017-07-28 (×3): qty 4
  Filled 2017-07-28: qty 2
  Filled 2017-07-28 (×4): qty 4
  Filled 2017-07-28: qty 2
  Filled 2017-07-28 (×4): qty 4
  Filled 2017-07-28: qty 2
  Filled 2017-07-28: qty 4

## 2017-07-28 MED ORDER — FLUPHENAZINE DECANOATE 25 MG/ML IJ SOLN
50.0000 mg | INTRAMUSCULAR | Status: DC
  Administered 2017-07-28 – 2017-08-11 (×2): 50 mg via INTRAMUSCULAR
  Filled 2017-07-28 (×3): qty 2

## 2017-07-28 MED ORDER — FUROSEMIDE 40 MG PO TABS
40.0000 mg | ORAL_TABLET | Freq: Every day | ORAL | Status: DC
  Administered 2017-07-28 – 2017-08-03 (×7): 40 mg via ORAL
  Filled 2017-07-28 (×7): qty 1

## 2017-07-28 MED ORDER — ADULT MULTIVITAMIN W/MINERALS CH
1.0000 | ORAL_TABLET | Freq: Every day | ORAL | Status: DC
  Administered 2017-07-28 – 2017-08-16 (×20): 1 via ORAL
  Filled 2017-07-28 (×22): qty 1

## 2017-07-28 MED ORDER — HYDRALAZINE HCL 50 MG PO TABS
50.0000 mg | ORAL_TABLET | Freq: Three times a day (TID) | ORAL | Status: DC
  Administered 2017-07-28 – 2017-08-02 (×15): 50 mg via ORAL
  Filled 2017-07-28 (×18): qty 1

## 2017-07-28 MED ORDER — BENZTROPINE MESYLATE 0.5 MG PO TABS
0.5000 mg | ORAL_TABLET | Freq: Two times a day (BID) | ORAL | Status: DC
  Administered 2017-07-28 – 2017-08-16 (×39): 0.5 mg via ORAL
  Filled 2017-07-28 (×42): qty 1

## 2017-07-28 MED ORDER — DICYCLOMINE HCL 20 MG PO TABS
20.0000 mg | ORAL_TABLET | Freq: Three times a day (TID) | ORAL | Status: DC
  Administered 2017-07-28 – 2017-08-04 (×21): 20 mg via ORAL
  Filled 2017-07-28 (×23): qty 1

## 2017-07-28 MED ORDER — FLUPHENAZINE HCL 5 MG PO TABS
5.0000 mg | ORAL_TABLET | Freq: Three times a day (TID) | ORAL | Status: DC
  Administered 2017-07-28 – 2017-08-16 (×57): 5 mg via ORAL
  Filled 2017-07-28 (×64): qty 1

## 2017-07-28 MED ORDER — GLIPIZIDE 10 MG PO TABS
10.0000 mg | ORAL_TABLET | Freq: Every day | ORAL | Status: DC
  Administered 2017-07-28 – 2017-08-15 (×18): 10 mg via ORAL
  Filled 2017-07-28 (×21): qty 1

## 2017-07-28 MED ORDER — ALBUTEROL SULFATE (2.5 MG/3ML) 0.083% IN NEBU
2.5000 mg | INHALATION_SOLUTION | Freq: Four times a day (QID) | RESPIRATORY_TRACT | Status: DC | PRN
  Administered 2017-07-30 – 2017-08-02 (×4): 2.5 mg via RESPIRATORY_TRACT
  Filled 2017-07-28 (×5): qty 3

## 2017-07-28 MED ORDER — ACETAMINOPHEN 325 MG PO TABS
650.0000 mg | ORAL_TABLET | ORAL | Status: DC | PRN
  Administered 2017-07-30 – 2017-08-02 (×5): 650 mg via ORAL
  Filled 2017-07-28 (×5): qty 2

## 2017-07-28 MED ORDER — AMLODIPINE BESYLATE 5 MG PO TABS
10.0000 mg | ORAL_TABLET | Freq: Every day | ORAL | Status: DC
  Administered 2017-07-28 – 2017-08-02 (×6): 10 mg via ORAL
  Filled 2017-07-28 (×7): qty 2

## 2017-07-28 MED ORDER — TRAZODONE HCL 50 MG PO TABS
100.0000 mg | ORAL_TABLET | Freq: Every day | ORAL | Status: DC
  Administered 2017-07-28 – 2017-08-04 (×8): 100 mg via ORAL
  Filled 2017-07-28 (×2): qty 1
  Filled 2017-07-28: qty 2
  Filled 2017-07-28 (×5): qty 1

## 2017-07-28 MED ORDER — LORAZEPAM 0.5 MG PO TABS
0.5000 mg | ORAL_TABLET | Freq: Every day | ORAL | Status: DC
  Administered 2017-07-28 – 2017-08-03 (×7): 0.5 mg via ORAL
  Filled 2017-07-28 (×7): qty 1

## 2017-07-28 MED ORDER — NITROGLYCERIN 0.4 MG SL SUBL: 0.4000 mg | SUBLINGUAL_TABLET | SUBLINGUAL | Status: DC | PRN

## 2017-07-28 MED ORDER — LORATADINE 10 MG PO TABS
10.0000 mg | ORAL_TABLET | Freq: Every day | ORAL | Status: DC
  Administered 2017-07-28 – 2017-08-16 (×20): 10 mg via ORAL
  Filled 2017-07-28 (×21): qty 1

## 2017-07-28 MED ORDER — MAGNESIUM HYDROXIDE 400 MG/5ML PO SUSP
30.0000 mL | Freq: Every day | ORAL | Status: DC | PRN
  Administered 2017-08-10: 22:00:00 30 mL via ORAL
  Filled 2017-07-28 (×3): qty 30

## 2017-07-28 MED ORDER — PRO-STAT SUGAR FREE PO LIQD
30.0000 mL | Freq: Two times a day (BID) | ORAL | Status: DC
  Administered 2017-07-28 – 2017-08-16 (×25): 30 mL via ORAL

## 2017-07-28 MED ORDER — GUAIFENESIN 100 MG/5ML PO SOLN
15.0000 mL | Freq: Four times a day (QID) | ORAL | Status: DC | PRN
  Administered 2017-07-29: 300 mg via ORAL
  Administered 2017-07-30: 100 mg via ORAL
  Administered 2017-08-09 – 2017-08-16 (×9): 300 mg via ORAL
  Filled 2017-07-28 (×11): qty 15

## 2017-07-28 MED ORDER — ASPIRIN EC 81 MG PO TBEC
81.0000 mg | DELAYED_RELEASE_TABLET | Freq: Every day | ORAL | Status: DC
  Administered 2017-07-28 – 2017-08-16 (×20): 81 mg via ORAL
  Filled 2017-07-28 (×22): qty 1

## 2017-07-28 MED ORDER — ATORVASTATIN CALCIUM 20 MG PO TABS
20.0000 mg | ORAL_TABLET | Freq: Every day | ORAL | Status: DC
  Administered 2017-07-28 – 2017-08-15 (×18): 20 mg via ORAL
  Filled 2017-07-28 (×20): qty 1

## 2017-07-28 MED ORDER — VITAMIN D 1000 UNITS PO TABS
1000.0000 [IU] | ORAL_TABLET | Freq: Every day | ORAL | Status: DC
  Administered 2017-07-28 – 2017-08-16 (×19): 1000 [IU] via ORAL
  Filled 2017-07-28 (×21): qty 1

## 2017-07-28 MED ORDER — TIOTROPIUM BROMIDE MONOHYDRATE 18 MCG IN CAPS
18.0000 ug | ORAL_CAPSULE | Freq: Every day | RESPIRATORY_TRACT | Status: DC
  Administered 2017-07-29 – 2017-08-16 (×18): 18 ug via RESPIRATORY_TRACT
  Filled 2017-07-28 (×4): qty 5

## 2017-07-28 MED ORDER — IBUPROFEN 600 MG PO TABS
600.0000 mg | ORAL_TABLET | Freq: Once | ORAL | Status: AC
  Administered 2017-07-28: 600 mg via ORAL
  Filled 2017-07-28: qty 1

## 2017-07-28 MED ORDER — INSULIN GLARGINE 100 UNIT/ML ~~LOC~~ SOLN
11.0000 [IU] | Freq: Every day | SUBCUTANEOUS | Status: DC
  Administered 2017-07-28 – 2017-08-15 (×19): 11 [IU] via SUBCUTANEOUS
  Filled 2017-07-28 (×23): qty 0.11

## 2017-07-28 MED ORDER — POTASSIUM CHLORIDE CRYS ER 20 MEQ PO TBCR
20.0000 meq | EXTENDED_RELEASE_TABLET | Freq: Every day | ORAL | Status: DC
  Administered 2017-07-28 – 2017-08-03 (×7): 20 meq via ORAL
  Filled 2017-07-28 (×7): qty 1

## 2017-07-28 MED ORDER — LISINOPRIL 10 MG PO TABS
40.0000 mg | ORAL_TABLET | Freq: Every day | ORAL | Status: DC
  Administered 2017-07-29 – 2017-08-01 (×4): 40 mg via ORAL
  Filled 2017-07-28 (×5): qty 4

## 2017-07-28 MED ORDER — DOCUSATE SODIUM 100 MG PO CAPS
200.0000 mg | ORAL_CAPSULE | Freq: Every day | ORAL | Status: DC
  Administered 2017-07-28 – 2017-08-16 (×20): 200 mg via ORAL
  Filled 2017-07-28 (×21): qty 2

## 2017-07-28 MED ORDER — FLUPHENAZINE HCL 5 MG/ML PO CONC
5.0000 mg | Freq: Three times a day (TID) | ORAL | Status: DC
  Filled 2017-07-28: qty 1

## 2017-07-28 MED ORDER — INSULIN ASPART 100 UNIT/ML ~~LOC~~ SOLN: 0.0000 [IU] | Freq: Three times a day (TID) | SUBCUTANEOUS | Status: DC

## 2017-07-28 NOTE — ED Notes (Signed)

## 2017-07-28 NOTE — ED Notes (Addendum)
Pt given  Diet sprite

## 2017-07-28 NOTE — ED Notes (Signed)
Patient is sitting in room on the bed, in NAD. Patient is calm and appropriate

## 2017-07-28 NOTE — ED Notes (Signed)
[name] [name] is at her bedside

## 2017-07-28 NOTE — Progress Notes (Signed)
Chaplain received request for spiritual care from nurse about patient requesting prayer and anointing oil for her leg. Chaplain met with patient, listened to concerns (son's safety, desire for family connection and reconciliation, her own future, etc.). Patient requested later visit from chaplain.    07/28/17 1016  Clinical Encounter Type  Visited With Patient  Visit Type Spiritual support  Referral From Nurse  Consult/Referral To Chaplain  Recommendations  (Patient requested follow-up from chaplain)  Spiritual Encounters  Spiritual Needs Prayer;Emotional  Stress Factors  Patient Stress Factors Family relationships;Loss of control

## 2017-07-28 NOTE — Progress Notes (Signed)
Inpatient Diabetes Program Recommendations  AACE/ADA: New Consensus Statement on Inpatient Glycemic Control (2015)  Target Ranges:  Prepandial:   less than 140 mg/dL      [facility]ak postprandial:   less than 180 mg/dL (1-2 hours)      Critically ill patients:  140 - 180 mg/dL   Results for SION, THANE (MRN [id number]) as of 07/28/2017 10:03  Ref. Range 07/28/2017 07:27  Glucose-Capillary Latest Ref Range: 65 - 99 mg/dL 127 (H)    Admit with: IVC'd due to delusional episode/ hallucinations  History: DM, CHF, COPD  Home DM Meds: Glipizide 10 mg daily       Lantus 11 units QHS       Novolog 0-10 units QID per SSI       Januvia 100 mg daily  Current Insulin Orders: Lantus 11 units QHS      Novolog 0-10 units TID AC/HS      Glipizide 10 mg daily       MD- Currently the RN has no parameters for Novolog SSI administration.  Please change Novolog SSi to Novolog Sensitive Correction Scale/ SSI (0-9 units) TID AC + HS using the hospital Glycemic Control Order set      --Will follow patient during hospitalization--  Wyn Quaker RN, MSN, CDE Diabetes Coordinator Inpatient Glycemic Control Team Team Pager: [phone] (8a-5p)

## 2017-07-28 NOTE — ED Notes (Signed)
BEHAVIORAL HEALTH ROUNDING Patient sleeping: No. Patient alert and oriented: yes Behavior appropriate: Yes.  ; If no, describe:  Nutrition and fluids offered: yes Toileting and hygiene offered: Yes  Sitter present: q15 minute observations and security monitoring Law enforcement present: Yes    

## 2017-07-28 NOTE — ED Notes (Signed)
Pt given lunch tray.

## 2017-07-28 NOTE — BH Assessment (Signed)
Referral information for Psychiatric Hospitalization faxed to;   [facility] [phone]),   . [facility] ([phone]),  . [facility] [phone])  . [facility] [phone]),   . [facility] [phone]),   . [address]Lurena Joiner [phone]),   . [facility] [phone])   . [facility] [phone] or [phone]),  . [facility] [phone])   . [facility]  . [facility]  . [facility]
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
## 2017-07-28 NOTE — Consult Note (Signed)
[facility] Consult   Reason for Consult:  psychosis Referring Physician:  Dr. Jimmye Norman Patient Identification: [name] [name] MRN:  [id number] Principal Diagnosis: Schizoaffective disorder, bipolar type [facility]) Diagnosis:   Patient Active Problem List   Diagnosis Date Noted  . Schizoaffective disorder, bipolar type ([place]) [F25.0]     Priority: High  . UTI (urinary tract infection) [N39.0] 01/16/2016  . Altered mental status [R41.82] 01/16/2016  . Metabolic encephalopathy [X52.84]   . Acute on chronic respiratory failure with hypoxia and hypercapnia (HCC) [X32.44, J96.22] 10/13/2015  . Involuntary commitment [Z04.6] 09/02/2015  . Urinary incontinence [R32] 10/30/2014  . GERD (gastroesophageal reflux disease) [K21.9] 10/30/2014  . OSA (obstructive sleep apnea) [G47.33] 10/30/2014  . Osteoarthrosis, unspecified whether generalized or localized, involving lower leg [M17.10] 10/30/2014  . COPD (chronic obstructive pulmonary disease) ([place]) [J44.9] 10/30/2014  . Chronic diastolic CHF (congestive heart failure) ([place]) [I50.32] 05/15/2014  . Morbid obesity ([place]) [E66.01]   . History of colon polyps [Z86.010] 03/09/2013  . Renal mass [N28.89] 06/26/2011  . 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 Hypertension [I10] 06/24/2011  . Diabetes mellitus ([place]) [E11.9] 06/24/2011    Total Time spent with patient: 1 hour   Identifying data. [name] [name] is a 72 year old female with a history of schizoaffective disorder and medication noncompliance.  Chief complaint. "They are trying to kill me."  History of present illness. Information was obtained from the patient and the chart. The patient was brought to the ER from her assisted living facility where she has been increasingly paranoid, accusing staf of trying to poison her with wrong medications, accusing her son who is the guardian of trying to get rid of her so he can take over some property, calling the State to report criminal  activities. According to her guardian, [name] [name] has been refusing medications frequently. She no longer is on Prolixin injections that worked for her so well in the past. There were frequent medication changes sometimes leading to somnolence. The patient herself denies any symptoms of mental illness: depression, anxiety or psychosis. She sleep "like a baby". She claims to take her medications as prescribed. No drugs or alcohol are involved.  Past psychiatric history. Long history of mental illness with multiple hospitalizations including extended stays at the [facility]. She responds very well to Prolixin and Depakote.  Family psychiatric history. None reported.  Social history. She is disabled from mental illness and lives in a group home. Her son, [name] [name], is her guardian.  Risk to Self: Suicidal Ideation: No Suicidal Intent: No Is patient at risk for suicide?: No Suicidal Plan?: No Access to Means: No What has been your use of drugs/alcohol within the last 12 months?: Pt denies How many times?: 0 Other Self Harm Risks: n/a Triggers for Past Attempts: None known Intentional Self Injurious Behavior: None Risk to Others: Homicidal Ideation: No Thoughts of Harm to Others: No Current Homicidal Intent: No Current Homicidal Plan: No Access to Homicidal Means: No Identified Victim: n/a History of harm to others?: No Assessment of Violence: None Noted Violent Behavior Description: nonr reported Does patient have access to weapons?: No Criminal Charges Pending?: No Does patient have a court date: No Prior Inpatient Therapy: Prior Inpatient Therapy: Yes Prior Therapy Dates: Several Prior Therapy Facilty/Provider(s): [facility] Reason for Treatment: Schizophrenia Prior Outpatient Therapy: Prior Outpatient Therapy: No Does patient have an ACCT team?: No Does patient have Intensive In-House Services?  : No Does patient have [facility] services? : No Does patient have P4CC  services?: No  Past  Medical History:  Past Medical History:  Diagnosis Date  . Anxiety   . Arthritis   . Bronchitis   . Bursitis   . CAD (coronary artery disease)   . CHF (congestive heart failure) ([place])   . Chronic cough   . Chronic kidney disease    shadow on x-ray  . COPD (chronic obstructive pulmonary disease) ([place])   . Depression   . DM2 (diabetes mellitus, type 2) ([place])   . Environmental allergies   . GERD (gastroesophageal reflux disease)   . Hypercholesteremia   . Hypertension   . Left ventricular outflow tract obstruction    a. echo 03/2014: EF 60-65%, hypernamic LV systolic fxn, mod LVH w/ LVOT gradient estimated at 68 mm Hg w/ valsalva, very small LV internal cavity size, mildly increased LV posterior wall thickness, mild Ao valve scl w/o stenosis, diastolic dysfunction, normal RVSP  . Lower extremity edema   . LVH (left ventricular hypertrophy)    a. echo suggests long standing uncontrolled htn. she will not do well when dehydrated, LV cavity obliteration  . Muscle weakness   . Obesity   . On supplemental oxygen therapy    AS NEEDED  . OSA (obstructive sleep apnea)    does not use machine  . Osteoarthritis   . Schizophrenia ([place])   . Tremors of nervous system   . Wheezing     Past Surgical History:  Procedure Laterality Date  . CATARACT EXTRACTION W/PHACO Left 09/10/2014   Procedure: CATARACT EXTRACTION PHACO AND INTRAOCULAR LENS PLACEMENT (IOC);  Surgeon: Birder Robson, MD;  Location: [facility] ORS;  Service: Ophthalmology;  Laterality: Left;  [place]: 00:44 AP%: 22.8 CDE: 10.24 Fluid lot #[id number]  . CATARACT EXTRACTION W/PHACO Right 10/15/2014   Procedure: CATARACT EXTRACTION PHACO AND INTRAOCULAR LENS PLACEMENT (IOC);  Surgeon: Birder Robson, MD;  Location: [facility] ORS;  Service: Ophthalmology;  Laterality: Right;  [place]: 00:52 AP:40.1 CDE:11.83 LOT PACK #[id number]  . CHOLECYSTECTOMY    . COLONOSCOPY  04/2011   [facility] per patient incomplete  . EYE SURGERY    . JOINT REPLACEMENT     TKR   . KNEE RECONSTRUCTION, MEDIAL PATELLAR FEMORAL LIGAMENT    . RENAL BIOPSY    . TONSILLECTOMY    . TONSILLECTOMY     Family History:  Family History  Problem Relation Age of Onset  . Heart attack Mother   . Colon cancer Neg Hx   . Liver disease Neg Hx    Social History:  Social History   Substance and Sexual Activity  Alcohol Use No   Comment: occ.     Social History   Substance and Sexual Activity  Drug Use No    Social History   Socioeconomic History  . Marital status: Widowed    Spouse name: Not on file  . Number of children: 2  . Years of education: Not on file  . Highest education level: Not on file  Occupational History  . Not on file  Social Needs  . Financial resource strain: Not on file  . Food insecurity:    Worry: Not on file    Inability: Not on file  . Transportation needs:    Medical: Not on file    Non-medical: Not on file  Tobacco Use  . Smoking status: Former Smoker    Packs/day: 3.00    Years: 40.00    Pack years: 120.00  . Smokeless tobacco: Never Used  . Tobacco comment: quit in 2009  Substance and Sexual Activity  . Alcohol use: No    Comment: occ.  . Drug use: No  . Sexual activity: Not Currently  Lifestyle  . Physical activity:    Days per week: Not on file    Minutes per session: Not on file  . Stress: Not on file  Relationships  . Social connections:    Talks on phone: Not on file    Gets together: Not on file    Attends religious service: Not on file    Active member of club or organization: Not on file    Attends meetings of clubs or organizations: Not on file    Relationship status: Not on file  Other Topics Concern  . Not on file  Social History Narrative  . Not on file   Additional Social History:    Allergies:   Allergies  Allergen Reactions  . Haldol [Haloperidol Decanoate] Swelling and Other (See Comments)    Reaction:  Swelling of tongue and blurred vision    . Metformin Diarrhea  . Prednisone Other  (See Comments)    Unknown reaction  . Raspberry Swelling and Other (See Comments)    Reaction:  Swelling of lips     Labs:  Results for orders placed or performed during the hospital encounter of 07/27/17 (from the past 48 hour(s))  Comprehensive metabolic panel     Status: Abnormal   Collection Time: 07/27/17  6:54 PM  Result Value Ref Range   Sodium 136 135 - 145 mmol/L   Potassium 3.6 3.5 - 5.1 mmol/L   Chloride 96 (L) 101 - 111 mmol/L   CO2 28 22 - 32 mmol/L   Glucose, Bld 104 (H) 65 - 99 mg/dL   BUN 26 (H) 6 - 20 mg/dL   Creatinine, Ser 1.27 (H) 0.44 - 1.00 mg/dL   Calcium 9.3 8.9 - 10.3 mg/dL   Total Protein 8.8 (H) 6.5 - 8.1 g/dL   Albumin 3.7 3.5 - 5.0 g/dL   AST 26 15 - 41 U/L   ALT 15 14 - 54 U/L   Alkaline Phosphatase 74 38 - 126 U/L   Total Bilirubin 0.2 (L) 0.3 - 1.2 mg/dL   GFR calc non Af Amer 41 (L) >60 mL/min   GFR calc Af Amer 48 (L) >60 mL/min    Comment: (NOTE) The eGFR has been calculated using the CKD EPI equation. This calculation has not been validated in all clinical situations. eGFR's persistently <60 mL/min signify possible Chronic Kidney Disease.    Anion gap 12 5 - 15    Comment: Performed at [facility], [address] [place], [place] [address]  Ethanol     Status: None   Collection Time: 07/27/17  6:54 PM  Result Value Ref Range   Alcohol, Ethyl (B) <10 <10 mg/dL    Comment: (NOTE) Lowest detectable limit for serum alcohol is 10 mg/dL. For medical purposes only. Performed at [facility], [address] [place], [place] [address]   Salicylate level     Status: None   Collection Time: 07/27/17  6:54 PM  Result Value Ref Range   Salicylate Lvl <9.3 2.8 - 30.0 mg/dL    Comment: Performed at [facility], [address] [place], [place] [address]  Acetaminophen level     Status: Abnormal   Collection Time: 07/27/17  6:54 PM  Result Value Ref Range   Acetaminophen (Tylenol), Serum <10 (L) 10 - 30 ug/mL  Comment: (NOTE) Therapeutic concentrations vary significantly. A range of 10-30 ug/mL  may be an effective concentration for many patients. However, some  are best treated at concentrations outside of this range. Acetaminophen concentrations >150 ug/mL at 4 hours after ingestion  and >50 ug/mL at 12 hours after ingestion are often associated with  toxic reactions. Performed at [facility], [address] [place], [place] [address]   cbc     Status: None   Collection Time: 07/27/17  6:54 PM  Result Value Ref Range   WBC 9.7 3.6 - 11.0 K/uL   RBC 4.89 3.80 - 5.20 MIL/uL   Hemoglobin 14.7 12.0 - 16.0 g/dL   HCT 44.5 35.0 - 47.0 %   MCV 90.9 80.0 - 100.0 fL   MCH 30.1 26.0 - 34.0 pg   MCHC 33.1 32.0 - 36.0 g/dL   RDW 14.5 11.5 - 14.5 %   Platelets 217 150 - 440 K/uL    Comment: Performed at [facility], [address], [place], [place] [address]  Urine Drug Screen, Qualitative     Status: None   Collection Time: 07/27/17  6:54 PM  Result Value Ref Range   Tricyclic, Ur Screen NONE DETECTED NONE DETECTED   Amphetamines, Ur Screen NONE DETECTED NONE DETECTED   MDMA (Ecstasy)Ur Screen NONE DETECTED NONE DETECTED   Cocaine Metabolite,Ur [place] NONE DETECTED NONE DETECTED   Opiate, Ur Screen NONE DETECTED NONE DETECTED   Phencyclidine (PCP) Ur S NONE DETECTED NONE DETECTED   Cannabinoid 50 Ng, Ur [place] NONE DETECTED NONE DETECTED   Barbiturates, Ur Screen NONE DETECTED NONE DETECTED   Benzodiazepine, Ur Scrn NONE DETECTED NONE DETECTED   Methadone Scn, Ur NONE DETECTED NONE DETECTED    Comment: (NOTE) Tricyclics + metabolites, urine    Cutoff 1000 ng/mL Amphetamines + metabolites, urine  Cutoff 1000 ng/mL MDMA (Ecstasy), urine              Cutoff 500 ng/mL Cocaine Metabolite, urine          Cutoff 300 ng/mL Opiate + metabolites, urine        Cutoff 300 ng/mL Phencyclidine (PCP), urine         Cutoff 25 ng/mL Cannabinoid, urine                 Cutoff 50  ng/mL Barbiturates + metabolites, urine  Cutoff 200 ng/mL Benzodiazepine, urine              Cutoff 200 ng/mL Methadone, urine                   Cutoff 300 ng/mL The urine drug screen provides only a preliminary, unconfirmed analytical test result and should not be used for non-medical purposes. Clinical consideration and professional judgment should be applied to any positive drug screen result due to possible interfering substances. A more specific alternate chemical method must be used in order to obtain a confirmed analytical result. Gas chromatography / mass spectrometry (GC/MS) is the preferred confirmat ory method. Performed at [facility], [address] [place], [place] [address]   Urinalysis, Complete w Microscopic     Status: Abnormal   Collection Time: 07/27/17  6:54 PM  Result Value Ref Range   Color, Urine YELLOW (A) YELLOW   APPearance HAZY (A) CLEAR   Specific Gravity, Urine 1.012 1.005 - 1.030   pH 5.0 5.0 - 8.0   Glucose, UA NEGATIVE NEGATIVE mg/dL   Hgb urine dipstick NEGATIVE NEGATIVE  Bilirubin Urine NEGATIVE NEGATIVE   Ketones, ur NEGATIVE NEGATIVE mg/dL   Protein, ur NEGATIVE NEGATIVE mg/dL   Nitrite NEGATIVE NEGATIVE   Leukocytes, UA SMALL (A) NEGATIVE   RBC / HPF 0-5 0 - 5 RBC/hpf   WBC, UA 0-5 0 - 5 WBC/hpf   Bacteria, UA RARE (A) NONE SEEN   Squamous Epithelial / LPF 0-5 0 - 5   Mucus PRESENT    Hyaline Casts, UA PRESENT     Comment: Performed at [facility], [address] [place], [place] [address]  Glucose, capillary     Status: Abnormal   Collection Time: 07/28/17  7:27 AM  Result Value Ref Range   Glucose-Capillary 127 (H) 65 - 99 mg/dL  Glucose, capillary     Status: Abnormal   Collection Time: 07/28/17  1:07 PM  Result Value Ref Range   Glucose-Capillary 148 (H) 65 - 99 mg/dL    Current Facility-Administered Medications  Medication Dose Route Frequency Provider Last Rate Last Dose  . acetaminophen (TYLENOL)  tablet 650 mg  650 mg Oral Q4H PRN Gregor Hams, MD      . albuterol (PROVENTIL) (2.5 MG/3ML) 0.083% nebulizer solution 2.5 mg  2.5 mg Inhalation Q6H PRN Gregor Hams, MD      . amLODipine (NORVASC) tablet 10 mg  10 mg Oral Daily Gregor Hams, MD   10 mg at 07/28/17 0956  . aspirin EC tablet 81 mg  81 mg Oral Daily Gregor Hams, MD   81 mg at 07/28/17 0957  . atorvastatin (LIPITOR) tablet 20 mg  20 mg Oral QHS Gregor Hams, MD      . benztropine (COGENTIN) tablet 0.5 mg  0.5 mg Oral BID Gregor Hams, MD   0.5 mg at 07/28/17 0958  . carvedilol (COREG) tablet 12.5 mg  12.5 mg Oral BID Gregor Hams, MD   12.5 mg at 07/28/17 0957  . cholecalciferol (VITAMIN D) tablet 1,000 Units  1,000 Units Oral Daily Gregor Hams, MD   1,000 Units at 07/28/17 [id number]  . dicyclomine (BENTYL) tablet 20 mg  20 mg Oral TID Gregor Hams, MD   20 mg at 07/28/17 1000  . docusate sodium (COLACE) capsule 200 mg  200 mg Oral Daily Gregor Hams, MD   200 mg at 07/28/17 0959  . feeding supplement (PRO-STAT SUGAR FREE 64) liquid 30 mL  30 mL Oral BID Gregor Hams, MD   30 mL at 07/28/17 1004  . fluPHENAZine (PROLIXIN) tablet 5 mg  5 mg Oral TID Arta Silence, MD   5 mg at 07/28/17 0958  . furosemide (LASIX) tablet 40 mg  40 mg Oral Daily Gregor Hams, MD   40 mg at 07/28/17 1001  . glipiZIDE (GLUCOTROL) tablet 10 mg  10 mg Oral Daily Gregor Hams, MD   10 mg at 07/28/17 1000  . guaiFENesin (ROBITUSSIN) 100 MG/5ML solution 300 mg  15 mL Oral Q6H PRN Gregor Hams, MD      . hydrALAZINE (APRESOLINE) tablet 50 mg  50 mg Oral TID Gregor Hams, MD   50 mg at 07/28/17 1007  . insulin aspart (novoLOG) injection 0-10 Units  0-10 Units Subcutaneous TID AC & HS Gregor Hams, MD      . insulin glargine (LANTUS) injection 11 Units  11 Units Subcutaneous QHS Gregor Hams, MD      . levothyroxine (SYNTHROID, LEVOTHROID) tablet 75 mcg  75 mcg  Oral Daily  Gregor Hams, MD   75 mcg at 07/28/17 1002  . lisinopril (PRINIVIL,ZESTRIL) tablet 40 mg  40 mg Oral QHS Gregor Hams, MD      . loratadine (CLARITIN) tablet 10 mg  10 mg Oral Daily Gregor Hams, MD   10 mg at 07/28/17 1002  . LORazepam (ATIVAN) tablet 0.5 mg  0.5 mg Oral QHS Gregor Hams, MD      . magnesium hydroxide (MILK OF MAGNESIA) suspension 30 mL  30 mL Oral Daily PRN Gregor Hams, MD      . multivitamin with minerals tablet 1 tablet  1 tablet Oral Daily Gregor Hams, MD   1 tablet at 07/28/17 1000  . nitroGLYCERIN (NITROSTAT) SL tablet 0.4 mg  0.4 mg Sublingual Q5 min PRN Gregor Hams, MD      . potassium chloride SA (K-DUR,KLOR-CON) CR tablet 20 mEq  20 mEq Oral Daily Gregor Hams, MD   20 mEq at 07/28/17 1002  . tiotropium (SPIRIVA) inhalation capsule 18 mcg  18 mcg Inhalation Daily Gregor Hams, MD      . traZODone (DESYREL) tablet 100 mg  100 mg Oral Daily Gregor Hams, MD   100 mg at 07/28/17 1003   Current Outpatient Medications  Medication Sig Dispense Refill  . acetaminophen (TYLENOL) 325 MG tablet Take 2 tablets (650 mg total) by mouth every 4 (four) hours as needed for mild pain (temp > 101.5). 30 tablet 2  . albuterol (PROVENTIL HFA;VENTOLIN HFA) 108 (90 Base) MCG/ACT inhaler Inhale 2 puffs into the lungs every 6 (six) hours as needed for wheezing or shortness of breath.    . Amino Acids-Protein Hydrolys (FEEDING SUPPLEMENT, PRO-STAT SUGAR FREE 64,) LIQD Take 30 mLs by mouth 2 (two) times daily.    Marland Kitchen amLODipine (NORVASC) 10 MG tablet Take 10 mg by mouth daily.    Marland Kitchen aspirin EC 81 MG tablet Take 81 mg by mouth daily.    Marland Kitchen atorvastatin (LIPITOR) 20 MG tablet Take 20 mg by mouth at bedtime.    . benztropine (COGENTIN) 0.5 MG tablet Take 0.5 mg by mouth 2 (two) times daily.    . carvedilol (COREG) 25 MG tablet Take 1 tablet (25 mg total) by mouth 2 (two) times daily. (Patient taking differently: Take 12.5 mg by mouth 2 (two) times  daily. ) 60 tablet 0  . cetirizine (ZYRTEC) 10 MG tablet Take 10 mg by mouth daily.    . cholecalciferol (VITAMIN D) 1000 units tablet Take 1,000 Units by mouth daily.    Marland Kitchen dextromethorphan-guaiFENesin (ROBITUSSIN-DM) 10-100 MG/5ML liquid Take by mouth every 4 (four) hours as needed for cough.    . dicyclomine (BENTYL) 20 MG tablet Take 20 mg by mouth 3 (three) times daily.    . divalproex (DEPAKOTE) 500 MG DR tablet Take 2 tablets (1,000 mg total) by mouth at bedtime. (Patient taking differently: Take 500 mg by mouth 3 (three) times daily. )    . docusate sodium (COLACE) 100 MG capsule Take 200 mg by mouth daily.    . fluPHENAZine Decanoate LIQD Take 25 mg by mouth every 14 (fourteen) days. MONDAYS    . Fluticasone-Salmeterol (ADVAIR) 250-50 MCG/DOSE AEPB Inhale 1 puff into the lungs 2 (two) times daily.    . furosemide (LASIX) 40 MG tablet Take 1 tablet (40 mg total) by mouth daily. 30 tablet 0  . glipiZIDE (GLUCOTROL) 10 MG tablet Take 1 tablet by mouth daily.    Marland Kitchen  guaiFENesin (ROBITUSSIN) 100 MG/5ML SOLN Take 15 mLs by mouth every 6 (six) hours as needed for cough or to loosen phlegm. *Notify physician if cough persists more than 3 days*    . hydrALAZINE (APRESOLINE) 50 MG tablet Take 1 tablet by mouth 3 (three) times daily.     . insulin aspart (NOVOLOG) 100 UNIT/ML injection Inject 0-10 Units into the skin 4 (four) times daily -  before meals and at bedtime.     . insulin glargine (LANTUS) 100 UNIT/ML injection Inject 11 Units into the skin at bedtime.     Marland Kitchen JANUVIA 100 MG tablet Take 1 tablet by mouth daily.    Marland Kitchen levothyroxine (SYNTHROID, LEVOTHROID) 75 MCG tablet Take 1 tablet by mouth daily.    Marland Kitchen lisinopril (PRINIVIL,ZESTRIL) 40 MG tablet Take 1 tablet (40 mg total) by mouth at bedtime. 30 tablet 0  . LORazepam (ATIVAN) 0.5 MG tablet Take 1 tablet by mouth at bedtime.     . magnesium hydroxide (MILK OF MAGNESIA) 400 MG/5ML suspension Take 30 mLs by mouth daily as needed for mild  constipation.    . Multiple Vitamin (MULTIVITAMIN WITH MINERALS) TABS tablet Take 1 tablet by mouth daily.    . NON FORMULARY Apply 1 application topically as needed.    . potassium chloride SA (K-DUR,KLOR-CON) 20 MEQ tablet Take 20 mEq by mouth daily.    . ranitidine (ZANTAC) 150 MG tablet Take 150 mg by mouth 2 (two) times daily.    . Skin Protectants, Misc. (EUCERIN) cream Apply 1 application topically daily.    Marland Kitchen tiotropium (SPIRIVA) 18 MCG inhalation capsule Place 1 capsule (18 mcg total) into inhaler and inhale daily. 30 capsule 12  . traZODone (DESYREL) 100 MG tablet Take 1 tablet by mouth daily.    . cephALEXin (KEFLEX) 500 MG capsule Take 2 capsules (1,000 mg total) by mouth 2 (two) times daily. (Patient not taking: Reported on 05/02/2017) 26 capsule 0  . fluPHENAZine (PROLIXIN) 5 MG/ML solution Take 5 mg by mouth 3 (three) times daily.    . metoCLOPramide (REGLAN) 10 MG tablet Take 1 tablet (10 mg total) by mouth every 6 (six) hours as needed for nausea or vomiting. (Patient not taking: Reported on 05/02/2017) 12 tablet 1  . nitroGLYCERIN (NITROSTAT) 0.4 MG SL tablet Place 0.4 mg under the tongue every 5 (five) minutes as needed for chest pain.      Musculoskeletal: Strength & Muscle Tone: within normal limits Gait & Station: normal Patient leans: N/A  Psychiatric Specialty Exam: Physical Exam  Nursing note and vitals reviewed. Psychiatric: Her speech is normal. Her affect is labile and inappropriate. She is withdrawn. Thought content is paranoid and delusional. Cognition and memory are impaired. She expresses impulsivity.    Review of Systems  Neurological: Negative.   Psychiatric/Behavioral: Positive for hallucinations.  All other systems reviewed and are negative.   Blood pressure 129/86, pulse (!) 101, temperature 98.9 F (37.2 C), temperature source Oral, resp. rate 14, SpO2 93 %.There is no height or weight on file to calculate BMI.  General Appearance: Disheveled  Eye  Contact:  Good  Speech:  Clear and Coherent  Volume:  Normal  Mood:  Dysphoric  Affect:  Tearful  Thought Process:  Goal Directed and Descriptions of Associations: Tangential  Orientation:  Full (Time, Place, and Person)  Thought Content:  Illogical and Paranoid Ideation  Suicidal Thoughts:  No  Homicidal Thoughts:  No  Memory:  Immediate;   Poor Recent;   Poor Remote;  Poor  Judgement:  Impaired  Insight:  Lacking  Psychomotor Activity:  Increased  Concentration:  Concentration: Poor and Attention Span: Poor  Recall:  Poor  Fund of Knowledge:  Fair  Language:  Fair  Akathisia:  No  Handed:  Right  AIMS (if indicated):     Assets:  Communication Skills Desire for Improvement Financial Resources/Insurance Housing Resilience Social Support  ADL's:  Intact  Cognition:  WNL  Sleep:        Treatment Plan Summary: Daily contact with patient to assess and evaluate symptoms and progress in treatment and Medication management   Patient in need of pasychiatric admission to geropsychiatry. Please continue all medications. She would benefit from injectable Prolixin decanoate. We will attempt to give 50 mg in now.   Disposition: Recommend psychiatric Inpatient admission when medically cleared. Supportive therapy provided about ongoing stressors.  Orson Slick, MD 07/28/2017 1:58 PM

## 2017-07-28 NOTE — ED Notes (Signed)
Pt. Alert and oriented, warm and dry, in no distress. Pt. Denies SI, HI, and AVH. Pt. Encouraged to let nursing staff know of any concerns or needs. 

## 2017-07-28 NOTE — Progress Notes (Signed)
   07/28/17 0645  Clinical Encounter Type  Visited With Patient  Visit Type Initial;Psychological support  Referral From Nurse  Consult/Referral To Chaplain  Spiritual Encounters  Spiritual Needs Prayer;Emotional   [facility] received a PG from ED that PT wanted a [place] visit. PT is well known to hospital staff and [facility] staff. PT was emotional and cried while talking with CH. CH offered prayer and will continue to follow up.
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
## 2017-07-28 NOTE — ED Notes (Signed)
Patient washed up by nursing staff, undergarments and clothing changed

## 2017-07-28 NOTE — ED Notes (Signed)
Pt. Assisted to bathroom with walker.

## 2017-07-29 DIAGNOSIS — F250 Schizoaffective disorder, bipolar type: Secondary | ICD-10-CM | POA: Diagnosis not present

## 2017-07-29 LAB — URINE CULTURE

## 2017-07-29 LAB — GLUCOSE, CAPILLARY
GLUCOSE-CAPILLARY: 191 mg/dL — AB (ref 65–99)
Glucose-Capillary: 251 mg/dL — ABNORMAL HIGH (ref 65–99)

## 2017-07-29 MED ORDER — INSULIN ASPART 100 UNIT/ML ~~LOC~~ SOLN
0.0000 [IU] | Freq: Three times a day (TID) | SUBCUTANEOUS | Status: DC
  Administered 2017-07-30: 2 [IU] via SUBCUTANEOUS
  Administered 2017-07-30: 5 [IU] via SUBCUTANEOUS
  Administered 2017-07-31: 3 [IU] via SUBCUTANEOUS
  Administered 2017-07-31: 2 [IU] via SUBCUTANEOUS
  Administered 2017-07-31 – 2017-08-01 (×2): 3 [IU] via SUBCUTANEOUS
  Administered 2017-08-01: 6 [IU] via SUBCUTANEOUS
  Administered 2017-08-02 (×2): 3 [IU] via SUBCUTANEOUS
  Administered 2017-08-03: 17:00:00 8 [IU] via SUBCUTANEOUS
  Administered 2017-08-04: 13:00:00 3 [IU] via SUBCUTANEOUS
  Administered 2017-08-04: 08:00:00 2 [IU] via SUBCUTANEOUS
  Administered 2017-08-05 – 2017-08-06 (×3): 3 [IU] via SUBCUTANEOUS
  Administered 2017-08-07: 12:00:00 5 [IU] via SUBCUTANEOUS
  Administered 2017-08-07: 17:00:00 3 [IU] via SUBCUTANEOUS
  Administered 2017-08-08: 2 [IU] via SUBCUTANEOUS
  Administered 2017-08-08: 12:00:00 3 [IU] via SUBCUTANEOUS
  Administered 2017-08-08: 08:00:00 2 [IU] via SUBCUTANEOUS
  Administered 2017-08-09: 5 [IU] via SUBCUTANEOUS
  Administered 2017-08-09: 09:00:00 2 [IU] via SUBCUTANEOUS
  Administered 2017-08-09: 3 [IU] via SUBCUTANEOUS
  Administered 2017-08-10: 17:00:00 2 [IU] via SUBCUTANEOUS
  Administered 2017-08-10 – 2017-08-11 (×3): 3 [IU] via SUBCUTANEOUS
  Administered 2017-08-11: 2 [IU] via SUBCUTANEOUS
  Administered 2017-08-12: 5 [IU] via SUBCUTANEOUS
  Administered 2017-08-13 – 2017-08-14 (×3): 2 [IU] via SUBCUTANEOUS
  Administered 2017-08-15: 13:00:00 3 [IU] via SUBCUTANEOUS
  Filled 2017-07-29 (×31): qty 1

## 2017-07-29 MED ORDER — INSULIN ASPART 100 UNIT/ML ~~LOC~~ SOLN
0.0000 [IU] | Freq: Every day | SUBCUTANEOUS | Status: DC
  Administered 2017-07-31: 2 [IU] via SUBCUTANEOUS
  Administered 2017-08-04: 3 [IU] via SUBCUTANEOUS
  Administered 2017-08-06 – 2017-08-10 (×2): 2 [IU] via SUBCUTANEOUS
  Filled 2017-07-29 (×3): qty 1

## 2017-07-29 NOTE — ED Provider Notes (Signed)
-----------------------------------------   6:42 AM on 07/29/2017 -----------------------------------------   Blood pressure 101/74, pulse 74, temperature 98.9 F (37.2 C), temperature source Oral, resp. rate 20, SpO2 94 %.  The patient had no acute events since last update.  Calm and cooperative at this time.  Disposition is pending Psychiatry/Behavioral Medicine team recommendations.     Paulette Blanch, MD 07/29/17 [phone]

## 2017-07-29 NOTE — ED Notes (Signed)
Patient lying in bed and appears to be asleep, NAD observed and will continue to monitor

## 2017-07-29 NOTE — ED Notes (Signed)
PT IVC/ PENDING PLACEMENT  

## 2017-07-29 NOTE — ED Notes (Signed)
Patient received PM snack. 
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
## 2017-07-29 NOTE — ED Notes (Signed)
Head of bed sat up per pt request so pt can eat. Pt wanted to know if anyone had heard from her children. This tech told pt it was still early and not yet.

## 2017-07-29 NOTE — ED Notes (Signed)
Pt in bathroom. Walked unassisted with walker, but wants someone to pull her pants down, wipe her and get her back to her room. Pt given toilet paper and told to do the best she can and then she can get a shower.

## 2017-07-30 DIAGNOSIS — F250 Schizoaffective disorder, bipolar type: Secondary | ICD-10-CM | POA: Diagnosis not present

## 2017-07-30 LAB — GLUCOSE, CAPILLARY
Glucose-Capillary: 128 mg/dL — ABNORMAL HIGH (ref 65–99)
Glucose-Capillary: 214 mg/dL — ABNORMAL HIGH (ref 65–99)
Glucose-Capillary: 151 mg/dL — ABNORMAL HIGH (ref 65–99)

## 2017-07-30 NOTE — Progress Notes (Signed)
Chaplain was paged for support. Chaplain talked with Pt shared that something we have to let God take care of. Chaplain encouraged Pt. Chaplain prayed for healing and peace for the Pt. Pt asked will we be back. Chaplain said maybe tomorrow.    07/30/17 1500  Clinical Encounter Type  Visited With Patient  Visit Type Spiritual support  Referral From Nurse  Spiritual Encounters  Spiritual Needs Prayer;Emotional

## 2017-07-30 NOTE — ED Notes (Signed)
Patient asleep in room. No noted distress or abnormal behavior. Will continue 15 minute checks and observation by security cameras for safety. 

## 2017-07-30 NOTE — ED Provider Notes (Signed)
-----------------------------------------   6:57 AM on 07/30/2017 -----------------------------------------   Blood pressure 140/90, pulse 75, temperature (!) 97.1 F (36.2 C), temperature source Oral, resp. rate 18, SpO2 94 %.  The patient had no acute events since last update.  Calm and cooperative at this time.  Pending placement.     Hinda Kehr, MD 07/30/17 [phone]

## 2017-07-30 NOTE — ED Notes (Signed)
Pt. Indicated she needed breathing treatment.  Pt. Has prn albuterol.  Pt. Given albuterol.

## 2017-07-30 NOTE — ED Notes (Signed)
Pt ambulated to bathroom, shut the door, used the toilet, stood up and ambulated back to bed stopping to clean her hands. Pt will not do the above if you will do it for her.

## 2017-07-30 NOTE — BH Assessment (Signed)
Writer collected following info from referred locations: [facility]- denied due to health reasons, per [facility]- no beds available [facility]-no answer [facility]-no answer

## 2017-07-30 NOTE — ED Notes (Signed)
Pt. Assisted to bathroom and assisted patient in cleaning patient.

## 2017-07-30 NOTE — ED Notes (Signed)
Pt. Stated have joint pain in shoulders and hands.  Pt. Requesting pain medication.

## 2017-07-30 NOTE — ED Notes (Signed)
Readjusted patient in bed, pt. States feeling better.

## 2017-07-31 DIAGNOSIS — F250 Schizoaffective disorder, bipolar type: Secondary | ICD-10-CM | POA: Diagnosis not present

## 2017-07-31 LAB — GLUCOSE, CAPILLARY
Glucose-Capillary: 125 mg/dL — ABNORMAL HIGH (ref 65–99)
Glucose-Capillary: 189 mg/dL — ABNORMAL HIGH (ref 65–99)
Glucose-Capillary: 173 mg/dL — ABNORMAL HIGH (ref 65–99)
Glucose-Capillary: 226 mg/dL — ABNORMAL HIGH (ref 65–99)

## 2017-07-31 NOTE — ED Notes (Signed)
Pt. Requesting to talk to psychiatrist tomorrow, pt. Tearful in room talking about her two sons.  Pt. States one son leaves in [place].  Pt. States other son lives locally.

## 2017-07-31 NOTE — ED Notes (Signed)
Pt given shower 

## 2017-07-31 NOTE — Progress Notes (Signed)
At the request of the patient's nurse,  Chaplain met with the patient and offered active listening, emotional support, and prayer. Patient continues to be distressed about returning to the nursing home and treatment she receives there. Patient was less weepy than the previous encounter and expressed joy over experiencing prayer.

## 2017-07-31 NOTE — ED Provider Notes (Signed)
-----------------------------------------   4:43 AM on 07/31/2017 -----------------------------------------   Blood pressure (!) 120/101, pulse 67, temperature (!) 97.1 F (36.2 C), temperature source Oral, resp. rate 18, SpO2 96 %.  The patient had no acute events since last update.  Calm and cooperative at this time.  Pending placement.     Harvest Dark, MD 07/31/17 [phone]

## 2017-07-31 NOTE — ED Notes (Signed)
Pt ambulated to bathroom and back to bed. Pt given lunch tray and diet ginger ale. Pt upset with this tech because this tech will not do things for the pt that the pt can do herself. Ex: Pt is not allowed to pull on this tech because pt uses wheelchair to get up when she wants to.

## 2017-07-31 NOTE — BH Assessment (Signed)
Writer followed up with referrals   [facility] [phone]), Declined   Rosana Hoes ([phone] or [phone]), Asked to refax   [facility] Cella [phone]), Unable to reach anyone   Renelda Loma,   [facility],   [place] [phone]), Left a message requesting a return phone call.   [facility] ([phone]), No beds   [facility] [phone]), No beds   [facility] (Christina GPQ-[phone]), Pending review   [facility] (Melissa-[phone] or [phone]), No beds
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
## 2017-08-01 DIAGNOSIS — F250 Schizoaffective disorder, bipolar type: Secondary | ICD-10-CM | POA: Diagnosis not present

## 2017-08-01 LAB — GLUCOSE, CAPILLARY
Glucose-Capillary: 162 mg/dL — ABNORMAL HIGH (ref 65–99)
Glucose-Capillary: 184 mg/dL — ABNORMAL HIGH (ref 65–99)
Glucose-Capillary: 109 mg/dL — ABNORMAL HIGH (ref 65–99)

## 2017-08-01 NOTE — ED Notes (Signed)
Pt walked to toilet with walker

## 2017-08-01 NOTE — ED Notes (Signed)
Pt given lunch tray.

## 2017-08-01 NOTE — Progress Notes (Signed)
   08/01/17 1610  Clinical Encounter Type  Visited With Patient  Visit Type Follow-up  Referral From Nurse   Chaplain received 2 pages from ED with requests to pray with patient.  Chaplain returned both pages and let staff know Chaplain would be there soon.  Arrived, visited with patient, and engaged in active listening as she told her story.  Patient is concerned that her son is not listening to her wishes about her care.  Chaplain offered emotional support and prayer.  At patient's request, Chaplain stayed with patient while routine vital signs were being taken but then ended the visit when paged for a Code Stroke.

## 2017-08-01 NOTE — ED Notes (Signed)
Resumed care from [facility], [place]. Pt sleeping soundly; slow, even respirations noted at this time. Will continue to monitor.

## 2017-08-01 NOTE — ED Notes (Signed)
Pt given breakfast tray before blood sugar taken and synthroid given.

## 2017-08-01 NOTE — ED Notes (Signed)
Pt. Alert and oriented, warm and dry, in no distress. Pt. Denies SI, HI, and AVH. Pt. Encouraged to let nursing staff know of any concerns or needs. 

## 2017-08-01 NOTE — Progress Notes (Signed)
Chaplain responded to a referral by nurse. Chaplain prayed with Pt and encourage her to speak with the creator on her behalf. She smiled and said she would.    08/01/17 1600  Clinical Encounter Type  Visited With Patient  Visit Type Follow-up  Referral From Nurse  Spiritual Encounters  Spiritual Needs Prayer

## 2017-08-01 NOTE — ED Notes (Signed)
Pt given telephone to make calls; wants to call "emmanuel" but doesn't know what or where it is.

## 2017-08-01 NOTE — ED Notes (Signed)
Chaplain at bedside

## 2017-08-01 NOTE — ED Provider Notes (Signed)
-----------------------------------------   5:58 AM on 08/01/2017 -----------------------------------------   Blood pressure (!) 156/84, pulse 60, temperature (!) 97.5 F (36.4 C), temperature source Oral, resp. rate 20, SpO2 99 %.  The patient had no acute events since last update.  Calm and cooperative at this time.  Disposition is pending Psychiatry/Behavioral Medicine team recommendations.     Paulette Blanch, MD 08/01/17 [phone]

## 2017-08-01 NOTE — ED Notes (Signed)
IVC pending placement   Papers to be renewed 6/12

## 2017-08-01 NOTE — ED Notes (Signed)

## 2017-08-01 NOTE — Consult Note (Addendum)
[facility] Psych ED Progress Note  08/01/2017 3:45 PM [name] [name]  MRN:  [id number]  Subjective:    [name] [name] is still paranoid and delusional. She believes that the facility she lives in is trying to kill her and that her son, the guardian, wants her property. She insists that her friend is working on an apartment and is begging me to "give her a chance". Spoke with the son who is in agreement with psychiatric hospitalization. I have known [name] [name] for years. This is her typical presentation when not well.   Principal Problem: Schizoaffective disorder, bipolar type ([place]) Diagnosis:   Patient Active Problem List   Diagnosis Date Noted  . Schizoaffective disorder, bipolar type ([place]) [F25.0]     Priority: High  . UTI (urinary tract infection) [N39.0] 01/16/2016  . Altered mental status [R41.82] 01/16/2016  . Metabolic encephalopathy [J47.82]   . Acute on chronic respiratory failure with hypoxia and hypercapnia (HCC) [N56.21, J96.22] 10/13/2015  . Involuntary commitment [Z04.6] 09/02/2015  . Urinary incontinence [R32] 10/30/2014  . GERD (gastroesophageal reflux disease) [K21.9] 10/30/2014  . OSA (obstructive sleep apnea) [G47.33] 10/30/2014  . Osteoarthrosis, unspecified whether generalized or localized, involving lower leg [M17.10] 10/30/2014  . COPD (chronic obstructive pulmonary disease) ([place]) [J44.9] 10/30/2014  . Chronic diastolic CHF (congestive heart failure) ([place]) [I50.32] 05/15/2014  . Morbid obesity ([place]) [E66.01]   . History of colon polyps [Z86.010] 03/09/2013  . Renal mass [N28.89] 06/26/2011  . 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 Hypertension [I10] 06/24/2011  . Diabetes mellitus ([place]) [E11.9] 06/24/2011   Total Time spent with patient: 20 minutes  Past Psychiatric History: schizoaffective disorder.  Past Medical History:  Past Medical History:  Diagnosis Date  . Anxiety   . Arthritis   . Bronchitis   . Bursitis   . CAD (coronary artery disease)   . CHF  (congestive heart failure) ([place])   . Chronic cough   . Chronic kidney disease    shadow on x-ray  . COPD (chronic obstructive pulmonary disease) ([place])   . Depression   . DM2 (diabetes mellitus, type 2) ([place])   . Environmental allergies   . GERD (gastroesophageal reflux disease)   . Hypercholesteremia   . Hypertension   . Left ventricular outflow tract obstruction    a. echo 03/2014: EF 60-65%, hypernamic LV systolic fxn, mod LVH w/ LVOT gradient estimated at 68 mm Hg w/ valsalva, very small LV internal cavity size, mildly increased LV posterior wall thickness, mild Ao valve scl w/o stenosis, diastolic dysfunction, normal RVSP  . Lower extremity edema   . LVH (left ventricular hypertrophy)    a. echo suggests long standing uncontrolled htn. she will not do well when dehydrated, LV cavity obliteration  . Muscle weakness   . Obesity   . On supplemental oxygen therapy    AS NEEDED  . OSA (obstructive sleep apnea)    does not use machine  . Osteoarthritis   . Schizophrenia ([place])   . Tremors of nervous system   . Wheezing     Past Surgical History:  Procedure Laterality Date  . CATARACT EXTRACTION W/PHACO Left 09/10/2014   Procedure: CATARACT EXTRACTION PHACO AND INTRAOCULAR LENS PLACEMENT (IOC);  Surgeon: Birder Robson, MD;  Location: [facility] ORS;  Service: Ophthalmology;  Laterality: Left;  [place]: 00:44 AP%: 22.8 CDE: 10.24 Fluid lot #[id number]  . CATARACT EXTRACTION W/PHACO Right 10/15/2014   Procedure: CATARACT EXTRACTION PHACO AND INTRAOCULAR LENS PLACEMENT (IOC);  Surgeon: Birder Robson, MD;  Location: [facility] ORS;  Service: Ophthalmology;  Laterality: Right;  [place]: 00:52 AP:40.1 CDE:11.83 LOT PACK #[id number]  . CHOLECYSTECTOMY    . COLONOSCOPY  04/2011   [facility] per patient incomplete  . EYE SURGERY    . JOINT REPLACEMENT     TKR  . KNEE RECONSTRUCTION, MEDIAL PATELLAR FEMORAL LIGAMENT    . RENAL BIOPSY    . TONSILLECTOMY    . TONSILLECTOMY     Family History:  Family History   Problem Relation Age of Onset  . Heart attack Mother   . Colon cancer Neg Hx   . Liver disease Neg Hx    Family Psychiatric  History: none Social History:  Social History   Substance and Sexual Activity  Alcohol Use No   Comment: occ.     Social History   Substance and Sexual Activity  Drug Use No    Social History   Socioeconomic History  . Marital status: Widowed    Spouse name: Not on file  . Number of children: 2  . Years of education: Not on file  . Highest education level: Not on file  Occupational History  . Not on file  Social Needs  . Financial resource strain: Not on file  . Food insecurity:    Worry: Not on file    Inability: Not on file  . Transportation needs:    Medical: Not on file    Non-medical: Not on file  Tobacco Use  . Smoking status: Former Smoker    Packs/day: 3.00    Years: 40.00    Pack years: 120.00  . Smokeless tobacco: Never Used  . Tobacco comment: quit in 2009  Substance and Sexual Activity  . Alcohol use: No    Comment: occ.  . Drug use: No  . Sexual activity: Not Currently  Lifestyle  . Physical activity:    Days per week: Not on file    Minutes per session: Not on file  . Stress: Not on file  Relationships  . Social connections:    Talks on phone: Not on file    Gets together: Not on file    Attends religious service: Not on file    Active member of club or organization: Not on file    Attends meetings of clubs or organizations: Not on file    Relationship status: Not on file  Other Topics Concern  . Not on file  Social History Narrative  . Not on file    Sleep: Fair  Appetite:  Good  Current Medications: Current Facility-Administered Medications  Medication Dose Route Frequency Provider Last Rate Last Dose  . acetaminophen (TYLENOL) tablet 650 mg  650 mg Oral Q4H PRN Gregor Hams, MD   650 mg at 07/31/17 1215  . albuterol (PROVENTIL) (2.5 MG/3ML) 0.083% nebulizer solution 2.5 mg  2.5 mg Inhalation Q6H  PRN Gregor Hams, MD   2.5 mg at 07/31/17 1215  . amLODipine (NORVASC) tablet 10 mg  10 mg Oral Daily Gregor Hams, MD   10 mg at 08/01/17 0957  . aspirin EC tablet 81 mg  81 mg Oral Daily Gregor Hams, MD   81 mg at 08/01/17 3532  . atorvastatin (LIPITOR) tablet 20 mg  20 mg Oral QHS Gregor Hams, MD   20 mg at 07/31/17 2341  . benztropine (COGENTIN) tablet 0.5 mg  0.5 mg Oral BID Gregor Hams, MD   0.5 mg at 08/01/17 0957  . carvedilol (COREG) tablet  12.5 mg  12.5 mg Oral BID Gregor Hams, MD   12.5 mg at 08/01/17 0957  . cholecalciferol (VITAMIN D) tablet 1,000 Units  1,000 Units Oral Daily Gregor Hams, MD   1,000 Units at 07/31/17 1042  . dicyclomine (BENTYL) tablet 20 mg  20 mg Oral TID Gregor Hams, MD   20 mg at 08/01/17 0956  . docusate sodium (COLACE) capsule 200 mg  200 mg Oral Daily Gregor Hams, MD   200 mg at 08/01/17 1225  . feeding supplement (PRO-STAT SUGAR FREE 64) liquid 30 mL  30 mL Oral BID Gregor Hams, MD   30 mL at 07/31/17 1043  . fluPHENAZine (PROLIXIN) tablet 5 mg  5 mg Oral TID Arta Silence, MD   5 mg at 08/01/17 1022  . fluPHENAZine decanoate (PROLIXIN) injection 50 mg  50 mg Intramuscular Q14 Days Tiffaney Heimann B, MD   50 mg at 07/28/17 1615  . furosemide (LASIX) tablet 40 mg  40 mg Oral Daily Gregor Hams, MD   40 mg at 08/01/17 0958  . glipiZIDE (GLUCOTROL) tablet 10 mg  10 mg Oral Daily Gregor Hams, MD   10 mg at 08/01/17 1225  . guaiFENesin (ROBITUSSIN) 100 MG/5ML solution 300 mg  15 mL Oral Q6H PRN Gregor Hams, MD   100 mg at 07/30/17 0255  . hydrALAZINE (APRESOLINE) tablet 50 mg  50 mg Oral TID Gregor Hams, MD   50 mg at 08/01/17 1538  . insulin aspart (novoLOG) injection 0-15 Units  0-15 Units Subcutaneous TID WC Gregor Hams, MD   3 Units at 08/01/17 1225  . insulin aspart (novoLOG) injection 0-5 Units  0-5 Units Subcutaneous QHS Gregor Hams, MD   2 Units at 07/31/17  2343  . insulin glargine (LANTUS) injection 11 Units  11 Units Subcutaneous QHS Gregor Hams, MD   11 Units at 07/31/17 2345  . levothyroxine (SYNTHROID, LEVOTHROID) tablet 75 mcg  75 mcg Oral Daily Gregor Hams, MD   75 mcg at 08/01/17 0958  . lisinopril (PRINIVIL,ZESTRIL) tablet 40 mg  40 mg Oral QHS Gregor Hams, MD   40 mg at 07/31/17 2345  . loratadine (CLARITIN) tablet 10 mg  10 mg Oral Daily Gregor Hams, MD   10 mg at 08/01/17 0958  . LORazepam (ATIVAN) tablet 0.5 mg  0.5 mg Oral QHS Gregor Hams, MD   0.5 mg at 07/31/17 2340  . magnesium hydroxide (MILK OF MAGNESIA) suspension 30 mL  30 mL Oral Daily PRN Gregor Hams, MD      . multivitamin with minerals tablet 1 tablet  1 tablet Oral Daily Gregor Hams, MD   1 tablet at 08/01/17 [phone]  . nitroGLYCERIN (NITROSTAT) SL tablet 0.4 mg  0.4 mg Sublingual Q5 min PRN Gregor Hams, MD      . potassium chloride SA (K-DUR,KLOR-CON) CR tablet 20 mEq  20 mEq Oral Daily Gregor Hams, MD   20 mEq at 08/01/17 0957  . tiotropium (SPIRIVA) inhalation capsule 18 mcg  18 mcg Inhalation Daily Gregor Hams, MD   18 mcg at 08/01/17 1024  . traZODone (DESYREL) tablet 100 mg  100 mg Oral Daily Gregor Hams, MD   100 mg at 08/01/17 9604   Current Outpatient Medications  Medication Sig Dispense Refill  . acetaminophen (TYLENOL) 325 MG tablet Take 2 tablets (650 mg total) by mouth every 4 (four) hours as needed  for mild pain (temp > 101.5). 30 tablet 2  . albuterol (PROVENTIL HFA;VENTOLIN HFA) 108 (90 Base) MCG/ACT inhaler Inhale 2 puffs into the lungs every 6 (six) hours as needed for wheezing or shortness of breath.    . Amino Acids-Protein Hydrolys (FEEDING SUPPLEMENT, PRO-STAT SUGAR FREE 64,) LIQD Take 30 mLs by mouth 2 (two) times daily.    Marland Kitchen amLODipine (NORVASC) 10 MG tablet Take 10 mg by mouth daily.    Marland Kitchen aspirin EC 81 MG tablet Take 81 mg by mouth daily.    Marland Kitchen atorvastatin (LIPITOR) 20 MG tablet Take 20  mg by mouth at bedtime.    . benztropine (COGENTIN) 0.5 MG tablet Take 0.5 mg by mouth 2 (two) times daily.    . carvedilol (COREG) 25 MG tablet Take 1 tablet (25 mg total) by mouth 2 (two) times daily. (Patient taking differently: Take 12.5 mg by mouth 2 (two) times daily. ) 60 tablet 0  . cetirizine (ZYRTEC) 10 MG tablet Take 10 mg by mouth daily.    . cholecalciferol (VITAMIN D) 1000 units tablet Take 1,000 Units by mouth daily.    Marland Kitchen dextromethorphan-guaiFENesin (ROBITUSSIN-DM) 10-100 MG/5ML liquid Take by mouth every 4 (four) hours as needed for cough.    . dicyclomine (BENTYL) 20 MG tablet Take 20 mg by mouth 3 (three) times daily.    . divalproex (DEPAKOTE) 500 MG DR tablet Take 2 tablets (1,000 mg total) by mouth at bedtime. (Patient taking differently: Take 500 mg by mouth 3 (three) times daily. )    . docusate sodium (COLACE) 100 MG capsule Take 200 mg by mouth daily.    . fluPHENAZine Decanoate LIQD Take 25 mg by mouth every 14 (fourteen) days. MONDAYS    . Fluticasone-Salmeterol (ADVAIR) 250-50 MCG/DOSE AEPB Inhale 1 puff into the lungs 2 (two) times daily.    . furosemide (LASIX) 40 MG tablet Take 1 tablet (40 mg total) by mouth daily. 30 tablet 0  . glipiZIDE (GLUCOTROL) 10 MG tablet Take 1 tablet by mouth daily.    Marland Kitchen guaiFENesin (ROBITUSSIN) 100 MG/5ML SOLN Take 15 mLs by mouth every 6 (six) hours as needed for cough or to loosen phlegm. *Notify physician if cough persists more than 3 days*    . hydrALAZINE (APRESOLINE) 50 MG tablet Take 1 tablet by mouth 3 (three) times daily.     . insulin aspart (NOVOLOG) 100 UNIT/ML injection Inject 0-10 Units into the skin 4 (four) times daily -  before meals and at bedtime.     . insulin glargine (LANTUS) 100 UNIT/ML injection Inject 11 Units into the skin at bedtime.     Marland Kitchen JANUVIA 100 MG tablet Take 1 tablet by mouth daily.    Marland Kitchen levothyroxine (SYNTHROID, LEVOTHROID) 75 MCG tablet Take 1 tablet by mouth daily.    Marland Kitchen lisinopril (PRINIVIL,ZESTRIL)  40 MG tablet Take 1 tablet (40 mg total) by mouth at bedtime. 30 tablet 0  . LORazepam (ATIVAN) 0.5 MG tablet Take 1 tablet by mouth at bedtime.     . magnesium hydroxide (MILK OF MAGNESIA) 400 MG/5ML suspension Take 30 mLs by mouth daily as needed for mild constipation.    . Multiple Vitamin (MULTIVITAMIN WITH MINERALS) TABS tablet Take 1 tablet by mouth daily.    . NON FORMULARY Apply 1 application topically as needed.    . potassium chloride SA (K-DUR,KLOR-CON) 20 MEQ tablet Take 20 mEq by mouth daily.    . ranitidine (ZANTAC) 150 MG tablet Take 150 mg by  mouth 2 (two) times daily.    . Skin Protectants, Misc. (EUCERIN) cream Apply 1 application topically daily.    Marland Kitchen tiotropium (SPIRIVA) 18 MCG inhalation capsule Place 1 capsule (18 mcg total) into inhaler and inhale daily. 30 capsule 12  . traZODone (DESYREL) 100 MG tablet Take 1 tablet by mouth daily.    . cephALEXin (KEFLEX) 500 MG capsule Take 2 capsules (1,000 mg total) by mouth 2 (two) times daily. (Patient not taking: Reported on 05/02/2017) 26 capsule 0  . fluPHENAZine (PROLIXIN) 5 MG/ML solution Take 5 mg by mouth 3 (three) times daily.    . metoCLOPramide (REGLAN) 10 MG tablet Take 1 tablet (10 mg total) by mouth every 6 (six) hours as needed for nausea or vomiting. (Patient not taking: Reported on 05/02/2017) 12 tablet 1  . nitroGLYCERIN (NITROSTAT) 0.4 MG SL tablet Place 0.4 mg under the tongue every 5 (five) minutes as needed for chest pain.      Lab Results:  Results for orders placed or performed during the hospital encounter of 07/27/17 (from the past 48 hour(s))  Glucose, capillary     Status: Abnormal   Collection Time: 07/30/17  5:11 PM  Result Value Ref Range   Glucose-Capillary 214 (H) 65 - 99 mg/dL  Glucose, capillary     Status: Abnormal   Collection Time: 07/30/17 10:20 PM  Result Value Ref Range   Glucose-Capillary 151 (H) 65 - 99 mg/dL   Comment 1 Notify RN   Glucose, capillary     Status: Abnormal   Collection  Time: 07/31/17  8:20 AM  Result Value Ref Range   Glucose-Capillary 125 (H) 65 - 99 mg/dL  Glucose, capillary     Status: Abnormal   Collection Time: 07/31/17 11:32 AM  Result Value Ref Range   Glucose-Capillary 189 (H) 65 - 99 mg/dL  Glucose, capillary     Status: Abnormal   Collection Time: 07/31/17  4:48 PM  Result Value Ref Range   Glucose-Capillary 173 (H) 65 - 99 mg/dL  Glucose, capillary     Status: Abnormal   Collection Time: 07/31/17  9:17 PM  Result Value Ref Range   Glucose-Capillary 226 (H) 65 - 99 mg/dL  Glucose, capillary     Status: Abnormal   Collection Time: 08/01/17 12:05 PM  Result Value Ref Range   Glucose-Capillary 162 (H) 65 - 99 mg/dL    Blood Alcohol level:  Lab Results  Component Value Date   ETH <10 07/27/2017   ETH <5 07/02/2016    Physical Findings: AIMS:  , ,  ,  ,    CIWA:    COWS:     Musculoskeletal: Strength & Muscle Tone: decreased Gait & Station: unsteady Patient leans: N/A  Psychiatric Specialty Exam: Physical Exam  Nursing note and vitals reviewed. Psychiatric: Her speech is normal. Her affect is labile and inappropriate. She is slowed. Thought content is paranoid and delusional. Cognition and memory are impaired. She expresses impulsivity.    Review of Systems  Neurological: Negative.   Psychiatric/Behavioral: Positive for hallucinations.  All other systems reviewed and are negative.   Blood pressure (!) 157/109, pulse 60, temperature (!) 97.5 F (36.4 C), temperature source Oral, resp. rate 20, SpO2 99 %.There is no height or weight on file to calculate BMI.  General Appearance: Fairly Groomed  Eye Contact:  Good  Speech:  Clear and Coherent  Volume:  Normal  Mood:  Dysphoric and Irritable  Affect:  Congruent  Thought Process:  Irrelevant  and Descriptions of Associations: Tangential  Orientation:  Full (Time, Place, and Person)  Thought Content:  Illogical, Delusions and Paranoid Ideation  Suicidal Thoughts:  No   Homicidal Thoughts:  No  Memory:  Immediate;   Poor Recent;   Poor Remote;   Poor  Judgement:  Poor  Insight:  Lacking  Psychomotor Activity:  Psychomotor Retardation  Concentration:  Concentration: Poor and Attention Span: Poor  Recall:  Poor  Fund of Knowledge:  Poor  Language:  Fair  Akathisia:  No  Handed:  Right  AIMS (if indicated):     Assets:  Communication Skills Desire for Improvement Financial Resources/Insurance Housing Resilience Social Support  ADL's:  Impaired  Cognition:  Impaired,  Moderate  Sleep:         Treatment Plan Summary: Daily contact with patient to assess and evaluate symptoms and progress in treatment and Medication management   PLAN: 1. The patient meets criteria for IVC. 2. She is awaiting geropsychiatric bed. Referrals were made. 3. Please continue current medications. She accepted Prolixin Decanoate 50 mg injection on 6/7 4. Guardian in agreement. 5. Will follow along  Orson Slick, MD 08/01/2017, 3:45 PM

## 2017-08-02 DIAGNOSIS — F250 Schizoaffective disorder, bipolar type: Secondary | ICD-10-CM | POA: Diagnosis not present

## 2017-08-02 LAB — GLUCOSE, CAPILLARY
GLUCOSE-CAPILLARY: 155 mg/dL — AB (ref 65–99)
Glucose-Capillary: 151 mg/dL — ABNORMAL HIGH (ref 65–99)
Glucose-Capillary: 167 mg/dL — ABNORMAL HIGH (ref 65–99)

## 2017-08-02 MED ORDER — ONDANSETRON 4 MG PO TBDP
4.0000 mg | ORAL_TABLET | Freq: Once | ORAL | Status: AC
  Administered 2017-08-02: 4 mg via ORAL

## 2017-08-02 MED ORDER — ONDANSETRON 4 MG PO TBDP
ORAL_TABLET | ORAL | Status: AC
  Administered 2017-08-02: 4 mg via ORAL
  Filled 2017-08-02: qty 1

## 2017-08-02 NOTE — Progress Notes (Signed)
   08/02/17 1303  Clinical Encounter Type  Visited With Patient  Visit Type Follow-up;Spiritual support  Referral From Nurse  Consult/Referral To Chaplain  Spiritual Encounters  Spiritual Needs Prayer  Stress Factors  Patient Stress Factors Other (Comment)  Family Stress Factors Not reviewed  [facility] pager by ED nursing staff to visit Mrs Crow, Mrs. Mull with multiple concerns to include her health, and emotional state. Ministry of presence ensued , prayer was offered and excepted by Mrs Geers.

## 2017-08-02 NOTE — ED Notes (Signed)
Pt. Alert and oriented, warm and dry, in no distress. Pt. Denies SI, HI, and AVH. Pt. Encouraged to let nursing staff know of any concerns or needs. 

## 2017-08-02 NOTE — ED Notes (Signed)
Patient c/o nausea. Given ODT Zofran per protocol. Dr. Alfred Levins aware.

## 2017-08-02 NOTE — ED Notes (Signed)
Patient complaining of shortness of breath. Neb treatment given per order.

## 2017-08-02 NOTE — ED Notes (Signed)
[name] came and was complaining about how he could not get up here to see patient during visiting hours, that he works and how no one can speak with patient on the phone. He states her pastor called and was not allowed to speak with patient. I advised Pt son that it was after phone hours and that this writer could not give any info on patient. I advised son that because of HIPPA info could not be given out to just anyone. Pt son states he understanding but he would like for Madilyn Hook could come visit with patient and might not be able to visit with patient during visiting times. Advised I did not have a problem with Heron Sabins visiting after visiting hours as long as there was no situations going on in unit for a 5 min visit. Advised I would pass along my recommendations to fellow staff. This Probation officer with the consent for Charge RN let son visit with patient for 10 mins.

## 2017-08-02 NOTE — ED Provider Notes (Signed)
-----------------------------------------   7:35 AM on 08/02/2017 -----------------------------------------   Blood pressure (!) 151/106, pulse 72, temperature 97.6 F (36.4 C), temperature source Oral, resp. rate 18, SpO2 100 %.  The patient had no acute events since last update.  Calm and cooperative at this time.  Disposition is pending Psychiatry/Behavioral Medicine team recommendations.  The patient has currently under review at many hospitals that are able to provide geriatric psychiatry.   Loney Hering, MD 08/02/17 [phone]

## 2017-08-02 NOTE — Progress Notes (Signed)
   08/02/17 2200  Clinical Encounter Type  Visited With Patient and family together  Visit Type Follow-up;Spiritual support  Referral From Physician  Consult/Referral To Chaplain  Recommendations  (follow up as needed)  Spiritual Encounters  Spiritual Needs Prayer  Stress Factors  Patient Stress Factors Other (Comment) (emotional discord)  [facility] responded to order requisition at 2130 hrs, Mrs, Halberg was requesting prayer per the order requisition. [place] arrived to find her son in the room as well, who was upset that the patient's pastor couldn't come to see and pray with her. He asked me was that possible, I directed him to the nursing staff for further assistance. Pastoral presence ensued, I comforted her and then Mrs. Scaglione, her son and I prayed together. I encouraged Mrs. Battiste to ask her nurse to call the [facility] service if she had any additional spiritual care needs.

## 2017-08-02 NOTE — ED Notes (Signed)

## 2017-08-03 ENCOUNTER — Emergency Department: Payer: Medicare HMO

## 2017-08-03 ENCOUNTER — Other Ambulatory Visit: Payer: Self-pay

## 2017-08-03 DIAGNOSIS — F250 Schizoaffective disorder, bipolar type: Secondary | ICD-10-CM | POA: Diagnosis not present

## 2017-08-03 DIAGNOSIS — N179 Acute kidney failure, unspecified: Secondary | ICD-10-CM | POA: Diagnosis present

## 2017-08-03 LAB — COMPREHENSIVE METABOLIC PANEL
ALBUMIN: 3.3 g/dL — AB (ref 3.5–5.0)
ALK PHOS: 65 U/L (ref 38–126)
ALT: 22 U/L (ref 14–54)
AST: 28 U/L (ref 15–41)
Anion gap: 9 (ref 5–15)
BUN: 32 mg/dL — AB (ref 6–20)
CALCIUM: 8.9 mg/dL (ref 8.9–10.3)
CO2: 30 mmol/L (ref 22–32)
CREATININE: 2.07 mg/dL — AB (ref 0.44–1.00)
Chloride: 101 mmol/L (ref 101–111)
GFR calc Af Amer: 26 mL/min — ABNORMAL LOW (ref >60)
GFR, EST NON AFRICAN AMERICAN: 23 mL/min — AB (ref >60)
GLUCOSE: 181 mg/dL — AB (ref 65–99)
Potassium: 4.8 mmol/L (ref 3.5–5.1)
Sodium: 140 mmol/L (ref 135–145)
TOTAL PROTEIN: 7.4 g/dL (ref 6.5–8.1)
Total Bilirubin: 0.5 mg/dL (ref 0.3–1.2)

## 2017-08-03 LAB — CBC WITH DIFFERENTIAL/PLATELET
BASOS ABS: 0.1 10*3/uL (ref 0–0.1)
Basophils Relative: 1 %
Eosinophils Absolute: 0.2 10*3/uL (ref 0–0.7)
Eosinophils Relative: 2 %
HEMATOCRIT: 39.8 % (ref 35.0–47.0)
HEMOGLOBIN: 13 g/dL (ref 12.0–16.0)
LYMPHS PCT: 33 %
Lymphs Abs: 2.7 10*3/uL (ref 1.0–3.6)
MCH: 30.1 pg (ref 26.0–34.0)
MCHC: 32.7 g/dL (ref 32.0–36.0)
MCV: 92.1 fL (ref 80.0–100.0)
MONO ABS: 1.1 10*3/uL — AB (ref 0.2–0.9)
Monocytes Relative: 14 %
NEUTROS ABS: 4.2 10*3/uL (ref 1.4–6.5)
Neutrophils Relative %: 50 %
Platelets: 258 10*3/uL (ref 150–440)
RBC: 4.32 MIL/uL (ref 3.80–5.20)
RDW: 14.9 % — AB (ref 11.5–14.5)
WBC: 8.2 10*3/uL (ref 3.6–11.0)

## 2017-08-03 LAB — GLUCOSE, CAPILLARY
GLUCOSE-CAPILLARY: 183 mg/dL — AB (ref 65–99)
GLUCOSE-CAPILLARY: 124 mg/dL — AB (ref 65–99)
Glucose-Capillary: 110 mg/dL — ABNORMAL HIGH (ref 65–99)
Glucose-Capillary: 256 mg/dL — ABNORMAL HIGH (ref 65–99)

## 2017-08-03 LAB — MRSA PCR SCREENING: MRSA by PCR: NEGATIVE

## 2017-08-03 LAB — TROPONIN I

## 2017-08-03 MED ORDER — ACETAMINOPHEN 325 MG PO TABS
650.0000 mg | ORAL_TABLET | Freq: Four times a day (QID) | ORAL | Status: DC | PRN
  Administered 2017-08-05 – 2017-08-13 (×12): 650 mg via ORAL
  Filled 2017-08-03 (×12): qty 2

## 2017-08-03 MED ORDER — SODIUM CHLORIDE 0.9 % IV BOLUS
1000.0000 mL | Freq: Once | INTRAVENOUS | Status: AC
  Administered 2017-08-03: 1000 mL via INTRAVENOUS

## 2017-08-03 MED ORDER — POLYETHYLENE GLYCOL 3350 17 G PO PACK
17.0000 g | PACK | Freq: Every day | ORAL | Status: DC | PRN
  Administered 2017-08-16: 17 g via ORAL
  Filled 2017-08-03: qty 1

## 2017-08-03 MED ORDER — MOMETASONE FURO-FORMOTEROL FUM 200-5 MCG/ACT IN AERO
2.0000 | INHALATION_SPRAY | Freq: Two times a day (BID) | RESPIRATORY_TRACT | Status: DC
  Administered 2017-08-04 – 2017-08-16 (×26): 2 via RESPIRATORY_TRACT
  Filled 2017-08-03: qty 8.8

## 2017-08-03 MED ORDER — ONDANSETRON HCL 4 MG PO TABS
4.0000 mg | ORAL_TABLET | Freq: Four times a day (QID) | ORAL | Status: DC | PRN
  Administered 2017-08-12: 4 mg via ORAL
  Filled 2017-08-03: qty 1

## 2017-08-03 MED ORDER — FAMOTIDINE 20 MG PO TABS
20.0000 mg | ORAL_TABLET | Freq: Two times a day (BID) | ORAL | Status: DC
  Administered 2017-08-03 – 2017-08-16 (×26): 20 mg via ORAL
  Filled 2017-08-03 (×26): qty 1

## 2017-08-03 MED ORDER — LEVOTHYROXINE SODIUM 50 MCG PO TABS
75.0000 ug | ORAL_TABLET | Freq: Every day | ORAL | Status: DC
  Administered 2017-08-04 – 2017-08-16 (×13): 75 ug via ORAL
  Filled 2017-08-03 (×14): qty 1

## 2017-08-03 MED ORDER — DIVALPROEX SODIUM 500 MG PO DR TAB
500.0000 mg | DELAYED_RELEASE_TABLET | Freq: Three times a day (TID) | ORAL | Status: DC
  Administered 2017-08-03 – 2017-08-16 (×39): 500 mg via ORAL
  Filled 2017-08-03 (×41): qty 1

## 2017-08-03 MED ORDER — ONDANSETRON HCL 4 MG/2ML IJ SOLN
4.0000 mg | Freq: Four times a day (QID) | INTRAMUSCULAR | Status: DC | PRN
  Administered 2017-08-03 – 2017-08-14 (×2): 4 mg via INTRAVENOUS
  Filled 2017-08-03 (×2): qty 2

## 2017-08-03 MED ORDER — HEPARIN SODIUM (PORCINE) 5000 UNIT/ML IJ SOLN
5000.0000 [IU] | Freq: Three times a day (TID) | INTRAMUSCULAR | Status: DC
  Administered 2017-08-03 – 2017-08-16 (×37): 5000 [IU] via SUBCUTANEOUS
  Filled 2017-08-03 (×38): qty 1

## 2017-08-03 MED ORDER — ACETAMINOPHEN 650 MG RE SUPP: 650.0000 mg | Freq: Four times a day (QID) | RECTAL | Status: DC | PRN

## 2017-08-03 NOTE — ED Notes (Signed)
Pt given breakfast tray at this time. 

## 2017-08-03 NOTE — ED Notes (Signed)
Pt stable at time of trasnfer to floor, NAD noted. RR even and unlabored. Belongings and paperwork with NT and ODS trasnferring

## 2017-08-03 NOTE — ED Provider Notes (Signed)
-----------------------------------------   7:17 AM on 08/03/2017 -----------------------------------------   Blood pressure 91/67, pulse 77, temperature 97.6 F (36.4 C), temperature source Oral, resp. rate 16, SpO2 (!) 89 %.  The patient had no acute events since last update.  Calm and cooperative at this time.  Disposition is pending Psychiatry/Behavioral Medicine team recommendations.     Paulette Blanch, MD 08/03/17 [phone]

## 2017-08-03 NOTE — ED Notes (Addendum)
Pt placed on 2L [place] of o2 per MD. PT 95% on 2L. Pt denies any SOB, no resp distress noted. Will continue to monitor. Pt placed on o2 monitoring at this time

## 2017-08-03 NOTE — ED Notes (Signed)
Chaplin at bedside per pt request

## 2017-08-03 NOTE — H&P (Signed)
[place] [facility] [place] NAME: Almyra Birman    MR#:  [id number]  DATE OF BIRTH:  September 10, 1945  DATE OF ADMISSION:  07/27/2017  PRIMARY CARE PHYSICIAN: Patient, No Pcp Per   REQUESTING/REFERRING PHYSICIAN: Dr. Harvest Dark  CHIEF COMPLAINT:   Chief Complaint  Patient presents with  . Hallucinations    HISTORY OF PRESENT ILLNESS:  Jaydi Bray  is a 72 y.o. female with a known history of schizophrenia with paranoid behavior, hypertension, diabetes, hypertrophic cardiomyopathy with congestive heart failure, depression, CAD presents from [facility] and has been in the emergency room since 07/27/2017 for paranoid behavior. Patient is a poor historian and unable to provide any history.  [name] is her legal guardian.  She was brought in tearful state and stating that she was worried that the people at [facility] are trying to kill her or take her to jail.  She is calm and oriented x2-3 at baseline.  Able to ambulate with a walker.  She has been in the emergency room waiting for a [facility] psych bed.  According to her nurse, she has been refusing her Prolixin injections and has not received them in a while.  She has long history of mental illness and multiple hospitalization to behavioral medicine unit.  Patient was noted to be more sleepy today.  Her still showing low normal blood pressure.  Labs repeated today showing worsening creatinine.  So she was started on IV fluid bolus, that caused her to be hypoxic and now requiring 2 L of oxygen.  Repeat chest x-ray is pending.  Being admitted to medical service for the same.  PAST MEDICAL HISTORY:   Past Medical History:  Diagnosis Date  . Anxiety   . Arthritis   . Bronchitis   . Bursitis   . CAD (coronary artery disease)   . CHF (congestive heart failure) ([place])   . Chronic cough   . Chronic kidney disease    shadow on x-ray  . COPD (chronic obstructive pulmonary disease) ([place])   .  Depression   . DM2 (diabetes mellitus, type 2) ([place])   . Environmental allergies   . GERD (gastroesophageal reflux disease)   . Hypercholesteremia   . Hypertension   . Left ventricular outflow tract obstruction    a. echo 03/2014: EF 60-65%, hypernamic LV systolic fxn, mod LVH w/ LVOT gradient estimated at 68 mm Hg w/ valsalva, very small LV internal cavity size, mildly increased LV posterior wall thickness, mild Ao valve scl w/o stenosis, diastolic dysfunction, normal RVSP  . Lower extremity edema   . LVH (left ventricular hypertrophy)    a. echo suggests long standing uncontrolled htn. she will not do well when dehydrated, LV cavity obliteration  . Muscle weakness   . Obesity   . On supplemental oxygen therapy    AS NEEDED  . OSA (obstructive sleep apnea)    does not use machine  . Osteoarthritis   . Schizophrenia ([place])   . Tremors of nervous system   . Wheezing     PAST SURGICAL HISTORY:   Past Surgical History:  Procedure Laterality Date  . CATARACT EXTRACTION W/PHACO Left 09/10/2014   Procedure: CATARACT EXTRACTION PHACO AND INTRAOCULAR LENS PLACEMENT (IOC);  Surgeon: Birder Robson, MD;  Location: [facility] ORS;  Service: Ophthalmology;  Laterality: Left;  [place]: 00:44 AP%: 22.8 CDE: 10.24 Fluid lot #[id number]  . CATARACT EXTRACTION W/PHACO Right 10/15/2014   Procedure: CATARACT EXTRACTION PHACO AND INTRAOCULAR LENS  PLACEMENT (IOC);  Surgeon: Birder Robson, MD;  Location: [facility] ORS;  Service: Ophthalmology;  Laterality: Right;  [place]: 00:52 AP:40.1 CDE:11.83 LOT PACK #[id number]  . CHOLECYSTECTOMY    . COLONOSCOPY  04/2011   [facility] per patient incomplete  . EYE SURGERY    . JOINT REPLACEMENT     TKR  . KNEE RECONSTRUCTION, MEDIAL PATELLAR FEMORAL LIGAMENT    . RENAL BIOPSY    . TONSILLECTOMY    . TONSILLECTOMY      SOCIAL HISTORY:   Social History   Tobacco Use  . Smoking status: Former Smoker    Packs/day: 3.00    Years: 40.00    Pack years: 120.00  . Smokeless  tobacco: Never Used  . Tobacco comment: quit in 2009  Substance Use Topics  . Alcohol use: No    Comment: occ.    FAMILY HISTORY:   Family History  Problem Relation Age of Onset  . Heart attack Mother   . Colon cancer Neg Hx   . Liver disease Neg Hx     DRUG ALLERGIES:   Allergies  Allergen Reactions  . Haldol [Haloperidol Decanoate] Swelling and Other (See Comments)    Reaction:  Swelling of tongue and blurred vision    . Metformin Diarrhea  . Prednisone Other (See Comments)    Unknown reaction  . Raspberry Swelling and Other (See Comments)    Reaction:  Swelling of lips     REVIEW OF SYSTEMS:   Review of Systems  Unable to perform ROS: Psychiatric disorder    MEDICATIONS AT HOME:   Prior to Admission medications   Medication Sig Start Date End Date Taking? Authorizing Provider  acetaminophen (TYLENOL) 325 MG tablet Take 2 tablets (650 mg total) by mouth every 4 (four) hours as needed for mild pain (temp > 101.5). 10/20/15  Yes Gladstone Lighter, MD  albuterol (PROVENTIL HFA;VENTOLIN HFA) 108 (90 Base) MCG/ACT inhaler Inhale 2 puffs into the lungs every 6 (six) hours as needed for wheezing or shortness of breath.   Yes [provider]  Amino Acids-Protein Hydrolys (FEEDING SUPPLEMENT, PRO-STAT SUGAR FREE 64,) LIQD Take 30 mLs by mouth 2 (two) times daily.   Yes [provider]  amLODipine (NORVASC) 10 MG tablet Take 10 mg by mouth daily.   Yes [provider]  aspirin EC 81 MG tablet Take 81 mg by mouth daily.   Yes [provider]  atorvastatin (LIPITOR) 20 MG tablet Take 20 mg by mouth at bedtime.   Yes [provider]  benztropine (COGENTIN) 0.5 MG tablet Take 0.5 mg by mouth 2 (two) times daily.   Yes [provider]  carvedilol (COREG) 25 MG tablet Take 1 tablet (25 mg total) by mouth 2 (two) times daily. Patient taking differently: Take 12.5 mg by mouth 2 (two) times daily.  01/21/16  Yes Annita Brod,  MD  cetirizine (ZYRTEC) 10 MG tablet Take 10 mg by mouth daily.   Yes [provider]  cholecalciferol (VITAMIN D) 1000 units tablet Take 1,000 Units by mouth daily.   Yes [provider]  dextromethorphan-guaiFENesin (ROBITUSSIN-DM) 10-100 MG/5ML liquid Take by mouth every 4 (four) hours as needed for cough.   Yes [provider]  dicyclomine (BENTYL) 20 MG tablet Take 20 mg by mouth 3 (three) times daily.   Yes [provider]  divalproex (DEPAKOTE) 500 MG DR tablet Take 2 tablets (1,000 mg total) by mouth at bedtime. Patient taking differently: Take 500 mg by mouth  3 (three) times daily.  01/21/16  Yes Annita Brod, MD  docusate sodium (COLACE) 100 MG capsule Take 200 mg by mouth daily.   Yes [provider]  fluPHENAZine Decanoate LIQD Take 25 mg by mouth every 14 (fourteen) days. MONDAYS   Yes [provider]  Fluticasone-Salmeterol (ADVAIR) 250-50 MCG/DOSE AEPB Inhale 1 puff into the lungs 2 (two) times daily.   Yes [provider]  furosemide (LASIX) 40 MG tablet Take 1 tablet (40 mg total) by mouth daily. 01/22/16  Yes Annita Brod, MD  glipiZIDE (GLUCOTROL) 10 MG tablet Take 1 tablet by mouth daily. 04/06/17  Yes [provider]  guaiFENesin (ROBITUSSIN) 100 MG/5ML SOLN Take 15 mLs by mouth every 6 (six) hours as needed for cough or to loosen phlegm. *Notify physician if cough persists more than 3 days*   Yes [provider]  hydrALAZINE (APRESOLINE) 50 MG tablet Take 1 tablet by mouth 3 (three) times daily.  04/08/17  Yes [provider]  insulin aspart (NOVOLOG) 100 UNIT/ML injection Inject 0-10 Units into the skin 4 (four) times daily -  before meals and at bedtime.    Yes [provider]  insulin glargine (LANTUS) 100 UNIT/ML injection Inject 11 Units into the skin at bedtime.    Yes [provider]  JANUVIA 100 MG tablet Take 1 tablet by mouth daily. 04/03/17  Yes  [provider]  levothyroxine (SYNTHROID, LEVOTHROID) 75 MCG tablet Take 1 tablet by mouth daily. 04/05/17  Yes [provider]  lisinopril (PRINIVIL,ZESTRIL) 40 MG tablet Take 1 tablet (40 mg total) by mouth at bedtime. 07/03/16  Yes Rankin, Shuvon B, NP  LORazepam (ATIVAN) 0.5 MG tablet Take 1 tablet by mouth at bedtime.  04/19/17  Yes [provider]  magnesium hydroxide (MILK OF MAGNESIA) 400 MG/5ML suspension Take 30 mLs by mouth daily as needed for mild constipation.   Yes [provider]  Multiple Vitamin (MULTIVITAMIN WITH MINERALS) TABS tablet Take 1 tablet by mouth daily.   Yes [provider]  NON FORMULARY Apply 1 application topically as needed.   Yes [provider]  potassium chloride SA (K-DUR,KLOR-CON) 20 MEQ tablet Take 20 mEq by mouth daily.   Yes [provider]  ranitidine (ZANTAC) 150 MG tablet Take 150 mg by mouth 2 (two) times daily.   Yes [provider]  Skin Protectants, Misc. (EUCERIN) cream Apply 1 application topically daily.   Yes [provider]  tiotropium (SPIRIVA) 18 MCG inhalation capsule Place 1 capsule (18 mcg total) into inhaler and inhale daily. 07/02/14  Yes [name], MD  traZODone (DESYREL) 100 MG tablet Take 1 tablet by mouth daily. 04/12/17  Yes [provider]  cephALEXin (KEFLEX) 500 MG capsule Take 2 capsules (1,000 mg total) by mouth 2 (two) times daily. Patient not taking: Reported on 05/02/2017 07/03/16   Rankin, Shuvon B, NP  fluPHENAZine (PROLIXIN) 5 MG/ML solution Take 5 mg by mouth 3 (three) times daily.    [provider]  metoCLOPramide (REGLAN) 10 MG tablet Take 1 tablet (10 mg total) by mouth every 6 (six) hours as needed for nausea or vomiting. Patient not taking: Reported on 05/02/2017 11/30/14   Orbie Pyo, MD  nitroGLYCERIN (NITROSTAT) 0.4 MG SL tablet Place 0.4 mg under the tongue every 5 (five) minutes as needed for chest  pain.    [provider]      VITAL SIGNS:  Blood pressure 107/63, pulse 72, temperature  97.8 F (36.6 C), temperature source Oral, resp. rate 16, SpO2 97 %.  PHYSICAL EXAMINATION:   Physical Exam  GENERAL:  72 y.o.-year-old obese patient lying in the bed with no acute distress.  EYES: Pupils equal, round, reactive to light and accommodation. No scleral icterus. Extraocular muscles intact.  HEENT: Head atraumatic, normocephalic. Oropharynx and nasopharynx clear.  NECK:  Supple, no jugular venous distention. No thyroid enlargement, no tenderness.  LUNGS: Normal breath sounds bilaterally, no wheezing, rales,rhonchi or crepitation. No use of accessory muscles of respiration. Decreased bibasilar breath sounds CARDIOVASCULAR: S1, S2 normal. No  rubs, or gallops. 2/6 systolic murmur present ABDOMEN: Soft, nontender, nondistended. Bowel sounds present. No organomegaly or mass.  EXTREMITIES: No pedal edema, cyanosis, or clubbing.  NEUROLOGIC: Cranial nerves II through XII are intact. Muscle strength 3/5 in all extremities. Sensation intact. Gait not checked. Global weakness Follows simple commands PSYCHIATRIC: The patient is alert and oriented to self. Very slow speech SKIN: No obvious rash, lesion, or ulcer.   LABORATORY PANEL:   CBC Recent Labs  Lab 08/03/17 1205  WBC 8.2  HGB 13.0  HCT 39.8  PLT 258   ------------------------------------------------------------------------------------------------------------------  Chemistries  Recent Labs  Lab 08/03/17 1205  NA 140  K 4.8  CL 101  CO2 30  GLUCOSE 181*  BUN 32*  CREATININE 2.07*  CALCIUM 8.9  AST 28  ALT 22  ALKPHOS 65  BILITOT 0.5   ------------------------------------------------------------------------------------------------------------------  Cardiac Enzymes Recent Labs  Lab 08/03/17 1205  TROPONINI <0.03    ------------------------------------------------------------------------------------------------------------------  RADIOLOGY:  Dg Chest Portable 1 View  Result Date: 08/03/2017 CLINICAL DATA:  SOB. Pt was feeling discomfort and oxygen was low. Chaplain found her to her to have less energy from previous encounters. EXAM: PORTABLE CHEST 1 VIEW COMPARISON:  05/02/2017 FINDINGS: Mildly tortuous thoracic aorta. Upper normal heart size for projection. Mediastinal contours otherwise unremarkable. The lungs appear clear. IMPRESSION: 1.  No active cardiopulmonary disease is radiographically apparent. 2. Tortuous thoracic aorta. Electronically Signed   By: Van Clines M.D.   On: 08/03/2017 14:53    EKG:   Orders placed or performed during the hospital encounter of 05/02/17  . EKG 12-Lead  . EKG 12-Lead  . ED EKG within 10 minutes  . ED EKG within 10 minutes  . EKG    IMPRESSION AND PLAN:   Ellianna Ruest  is a 72 y.o. female with a known history of schizophrenia with paranoid behavior, hypertension, diabetes, hypertrophic cardiomyopathy with congestive heart failure, depression, CAD presents from [facility] and has been in the emergency room since 07/27/2017 for paranoid behavior.  1.  Acute renal failure on CKD stage III.  Could have been dehydration while waiting in the emergency room.  Has not received any fluids up until today.  Intake has been pretty decent. -We will hold her lisinopril -Will need gentle hydration instead of a bolus.  Avoid nephrotoxins -Hold her antihypertensives and monitor renal function.  Avoid fluids due to pulmonary edema.  2.  Hypoxia-secondary to acute pulmonary edema from IV fluid bolus.  Known history of hypertrophic cardiomyopathy.  Avoid hypotension -Very sensitive to fluids.  Restart Lasix if renal function improves tomorrow. -On 2 L oxygen, chest x-ray is pending -Stop IV fluids.  Incentive spirometry and monitor  3.  Schizophrenia with paranoid  behavior-on involuntary commitment.  Psych follow-up -Awaiting to go to a [facility] psych facility.  Continue all her antipsychotics.  Pleasant at this time  4.  Diabetes mellitus-continue Lantus and sliding  scale insulin.  Also on glipizide  5.  Hypothyroidism-continue Synthroid  6.  Hypertension-hold antihypertensives due to hypotension today  7.  DVT prophylaxis-subcutaneous heparin.  Physical therapy consult and social worker consult requested.   All the records are reviewed and case discussed with ED provider. Management plans discussed with the patient, family and they are in agreement.  CODE STATUS: Full Code  TOTAL TIME TAKING CARE OF THIS PATIENT: 55 minutes.    Gladstone Lighter M.D on 08/03/2017 at 3:07 PM  Between 7am to 6pm - Pager - [phone]  After 6pm go to [email or web address] - password [email or web address] [place]  Office  [phone]  CC: Primary care physician; Patient, No Pcp Per

## 2017-08-03 NOTE — BH Assessment (Addendum)
Psych Inpatient Referrals: [facility], [facility] [phone] List [place] ([phone]), "She have maximize her therapeutic treatment," with them.

## 2017-08-03 NOTE — Progress Notes (Signed)
Chaplain received an OR for consultation to spiritual care for patient. Patient finally moved to medical unit from ED today. Chaplain provided spiritual care and emotional support to the patient through conversation and prayer. Patient indicated that she had been visited by her son and was glad to have seen him.  After talking with patient, chaplain asked how she could be helpful and what she could pray for. Patient shared concern for family, health, and specifically an upcoming operation.  Chaplain prayed for patients' concerns before completing the visit.    08/03/17 2055  Clinical Encounter Type  Visited With Patient  Visit Type Spiritual support  Spiritual Encounters  Spiritual Needs Prayer;Emotional  Stress Factors  Patient Stress Factors Family relationships;Loss of control

## 2017-08-03 NOTE — ED Notes (Signed)
Pt eating and drinking lunch tray at this time

## 2017-08-03 NOTE — ED Notes (Signed)
Son, legal guardian called at this time was unsuccessful to update that pt has been transferred to medical floor

## 2017-08-03 NOTE — ED Provider Notes (Addendum)
-----------------------------------------   1:39 PM on 08/03/2017 -----------------------------------------  Patient's blood pressure was noted to be in the upper 80s and low 90s on multiple checks this morning.  Placed the patient on IV fluids, after 1 L her blood pressure came up to 107/63.  Patient was largely asymptomatic.  Patient's repeat lab work does show renal insufficiency with a creatinine of 2.  We will IV hydrate the patient.  Continue to await psychiatric disposition/placement.  Lab work otherwise nonrevealing.  I discussed with patient the importance of drinking plenty of fluids.   Harvest Dark, MD 08/03/17 1341   ----------------------------------------- 2:34 PM on 08/03/2017 -----------------------------------------  After IV fluids patient's blood pressure is improved to 107/63 however she is now noted to be hypoxic around 82 to 83%.  After multiple attempts, encouraging the patient take deep breaths she is only satting 85 to 86% on room air with a good waveform.  Placed the patient on supplemental oxygen, now satting 92 to 93% on 2 L of oxygen.  I repeated chest x-ray on my review does not appear to be overly concerning for pulmonary edema however given the patient's renal insufficiency, hypoxia.  I discussed the patient with the hospitalist will be admitting to their service for further treatment.       Harvest Dark, MD 08/03/17 1435

## 2017-08-03 NOTE — Progress Notes (Signed)
Chaplain was rounding and was referred to Pt. Pt was feeling discomfort and oxygen was low. Chaplain found her to her to have less energy from previous encounters. Pt continue to reflect on previous care facility. Chaplain encourage her to think of where she is and the care she is receiving . Pt agreed.    08/03/17 1400  Clinical Encounter Type  Visited With Patient  Visit Type Psychological support;Spiritual support  Referral From Nurse

## 2017-08-03 NOTE — BH Assessment (Signed)
Psych Inpatient Referrals: [facility] ([phone])--Wait List

## 2017-08-04 ENCOUNTER — Inpatient Hospital Stay: Payer: Medicare HMO

## 2017-08-04 DIAGNOSIS — F250 Schizoaffective disorder, bipolar type: Secondary | ICD-10-CM | POA: Diagnosis not present

## 2017-08-04 LAB — GLUCOSE, CAPILLARY
GLUCOSE-CAPILLARY: 195 mg/dL — AB (ref 65–99)
GLUCOSE-CAPILLARY: 89 mg/dL (ref 65–99)
GLUCOSE-CAPILLARY: 116 mg/dL — AB (ref 65–99)
Glucose-Capillary: 181 mg/dL — ABNORMAL HIGH (ref 65–99)
Glucose-Capillary: 149 mg/dL — ABNORMAL HIGH (ref 65–99)

## 2017-08-04 LAB — CBC
HCT: 37.5 % (ref 35.0–47.0)
HEMOGLOBIN: 12 g/dL (ref 12.0–16.0)
MCH: 30 pg (ref 26.0–34.0)
MCHC: 32.1 g/dL (ref 32.0–36.0)
MCV: 93.4 fL (ref 80.0–100.0)
PLATELETS: 230 10*3/uL (ref 150–440)
RBC: 4.02 MIL/uL (ref 3.80–5.20)
RDW: 15.3 % — ABNORMAL HIGH (ref 11.5–14.5)
WBC: 9.1 10*3/uL (ref 3.6–11.0)

## 2017-08-04 LAB — BASIC METABOLIC PANEL
Anion gap: 5 (ref 5–15)
BUN: 33 mg/dL — ABNORMAL HIGH (ref 6–20)
CALCIUM: 8.1 mg/dL — AB (ref 8.9–10.3)
CHLORIDE: 104 mmol/L (ref 101–111)
CO2: 28 mmol/L (ref 22–32)
CREATININE: 1.7 mg/dL — AB (ref 0.44–1.00)
GFR calc Af Amer: 34 mL/min — ABNORMAL LOW (ref >60)
GFR calc non Af Amer: 29 mL/min — ABNORMAL LOW (ref >60)
GLUCOSE: 163 mg/dL — AB (ref 65–99)
Potassium: 5.2 mmol/L — ABNORMAL HIGH (ref 3.5–5.1)
Sodium: 137 mmol/L (ref 135–145)

## 2017-08-04 MED ORDER — TRAZODONE HCL 50 MG PO TABS: 100.0000 mg | ORAL_TABLET | Freq: Every day | ORAL | Status: DC

## 2017-08-04 NOTE — Progress Notes (Signed)
SWOT called to patient room by CNA. Sitter and CNA concerned that patient has been sleeping and lethargic most of shift. Reviewed MAR noted patient had 100mg  Trazodone this am. Paged MD to notify. Messaged pharmacy to change schedule to nights. Notified primary RN. Patient sleeping with sitter at bedside. Able to arouse with tactile stimulation.

## 2017-08-04 NOTE — Consult Note (Signed)
Psychiatry: I attempted to call the patient's son at the telephone number listed in the chart.  I was directed to a message that said that he had a "mailbox that had not yet been set up".  I will continue to try to follow up as needed.
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
## 2017-08-04 NOTE — Plan of Care (Signed)
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
## 2017-08-04 NOTE — NC FL2 (Signed)
[place] LEVEL OF CARE SCREENING TOOL     IDENTIFICATION  Patient Name: [name] [name] Birthdate: 02-15-1946 Sex: female Admission Date (Current Location): 07/27/2017  [place] and [place] Number:  Engineering geologist and Address:  [facility], [address], [place], [place] [address]      Provider Number: [id number]  Attending Physician Name and Address:  Fritzi Mandes, MD  Relative Name and Phone Number:  [name], son [phone]    Current Level of Care: Hospital Recommended Level of Care: [place] Prior Approval Number:    Date Approved/Denied:   PASRR Number: [id number]  Discharge Plan: SNF    Current Diagnoses: Patient Active Problem List   Diagnosis Date Noted  . ARF (acute renal failure) ([place]) 08/03/2017  . UTI (urinary tract infection) 01/16/2016  . Altered mental status 01/16/2016  . Metabolic encephalopathy   . Acute on chronic respiratory failure with hypoxia and hypercapnia ([place]) 10/13/2015  . Involuntary commitment 09/02/2015  . Schizoaffective disorder, bipolar type ([place])   . Urinary incontinence 10/30/2014  . GERD (gastroesophageal reflux disease) 10/30/2014  . OSA (obstructive sleep apnea) 10/30/2014  . Osteoarthrosis, unspecified whether generalized or localized, involving lower leg 10/30/2014  . COPD (chronic obstructive pulmonary disease) ([place]) 10/30/2014  . Chronic diastolic CHF (congestive heart failure) ([place]) 05/15/2014  . Morbid obesity ([place])   . History of colon polyps 03/09/2013  . Renal mass 06/26/2011  . Lytic bone lesion of hip 06/25/2011  . Hypertension 06/24/2011  . Diabetes mellitus ([place]) 06/24/2011    Orientation RESPIRATION BLADDER Height & Weight     Self, Place  Normal Continent Weight: 271 lb 11.2 oz (123.2 kg) Height:  5\' 6"  (167.6 cm)  BEHAVIORAL SYMPTOMS/MOOD NEUROLOGICAL BOWEL NUTRITION STATUS  (None) (none) Continent Diet(Heart Healthy )  AMBULATORY  STATUS COMMUNICATION OF NEEDS Skin   Extensive Assist Verbally Normal                       Personal Care Assistance Level of Assistance  Bathing, Feeding, Dressing Bathing Assistance: Limited assistance Feeding assistance: Independent Dressing Assistance: Limited assistance     Functional Limitations Info  Sight, Hearing, Speech Sight Info: Adequate Hearing Info: Adequate Speech Info: Adequate    SPECIAL CARE FACTORS FREQUENCY                       Contractures Contractures Info: Not present    Additional Factors Info  Code Status, Allergies Code Status Info: Full Code  Allergies Info: Haldol, Metformin, Prednisone, Raspberry           Current Medications (08/04/2017):  This is the current hospital active medication list Current Facility-Administered Medications  Medication Dose Route Frequency Provider Last Rate Last Dose  . acetaminophen (TYLENOL) tablet 650 mg  650 mg Oral Q6H PRN Gladstone Lighter, MD       Or  . acetaminophen (TYLENOL) suppository 650 mg  650 mg Rectal Q6H PRN Gladstone Lighter, MD      . albuterol (PROVENTIL) (2.5 MG/3ML) 0.083% nebulizer solution 2.5 mg  2.5 mg Inhalation Q6H PRN Gregor Hams, MD   2.5 mg at 08/02/17 2241  . aspirin EC tablet 81 mg  81 mg Oral Daily Gregor Hams, MD   81 mg at 08/04/17 0854  . atorvastatin (LIPITOR) tablet 20 mg  20 mg Oral QHS Gregor Hams, MD   20 mg at 08/04/17 0122  . benztropine (COGENTIN)  tablet 0.5 mg  0.5 mg Oral BID Gregor Hams, MD   0.5 mg at 08/04/17 9833  . carvedilol (COREG) tablet 12.5 mg  12.5 mg Oral BID Gregor Hams, MD   12.5 mg at 08/04/17 0850  . cholecalciferol (VITAMIN D) tablet 1,000 Units  1,000 Units Oral Daily Gregor Hams, MD   1,000 Units at 08/04/17 0851  . divalproex (DEPAKOTE) DR tablet 500 mg  500 mg Oral TID Gladstone Lighter, MD   500 mg at 08/04/17 0852  . docusate sodium (COLACE) capsule 200 mg  200 mg Oral Daily Gregor Hams, MD    200 mg at 08/04/17 0853  . famotidine (PEPCID) tablet 20 mg  20 mg Oral BID Gladstone Lighter, MD   20 mg at 08/04/17 0854  . feeding supplement (PRO-STAT SUGAR FREE 64) liquid 30 mL  30 mL Oral BID Gregor Hams, MD   30 mL at 08/04/17 1031  . fluPHENAZine (PROLIXIN) tablet 5 mg  5 mg Oral TID Arta Silence, MD   5 mg at 08/04/17 0851  . fluPHENAZine decanoate (PROLIXIN) injection 50 mg  50 mg Intramuscular Q14 Days Pucilowska, Jolanta B, MD   50 mg at 07/28/17 1615  . glipiZIDE (GLUCOTROL) tablet 10 mg  10 mg Oral Daily Gregor Hams, MD   10 mg at 08/04/17 0853  . guaiFENesin (ROBITUSSIN) 100 MG/5ML solution 300 mg  15 mL Oral Q6H PRN Gregor Hams, MD   100 mg at 07/30/17 0255  . heparin injection 5,000 Units  5,000 Units Subcutaneous Q8H Gladstone Lighter, MD   5,000 Units at 08/04/17 0602  . insulin aspart (novoLOG) injection 0-15 Units  0-15 Units Subcutaneous TID WC Gregor Hams, MD   3 Units at 08/04/17 1231  . insulin aspart (novoLOG) injection 0-5 Units  0-5 Units Subcutaneous QHS Gregor Hams, MD   3 Units at 08/04/17 1230  . insulin glargine (LANTUS) injection 11 Units  11 Units Subcutaneous QHS Gregor Hams, MD   11 Units at 08/04/17 0124  . levothyroxine (SYNTHROID, LEVOTHROID) tablet 75 mcg  75 mcg Oral QAC breakfast Gladstone Lighter, MD   75 mcg at 08/04/17 0749  . loratadine (CLARITIN) tablet 10 mg  10 mg Oral Daily Gregor Hams, MD   10 mg at 08/04/17 0852  . magnesium hydroxide (MILK OF MAGNESIA) suspension 30 mL  30 mL Oral Daily PRN Gregor Hams, MD      . mometasone-formoterol [facility]) 200-5 MCG/ACT inhaler 2 puff  2 puff Inhalation BID Gladstone Lighter, MD   2 puff at 08/04/17 0858  . multivitamin with minerals tablet 1 tablet  1 tablet Oral Daily Gregor Hams, MD   1 tablet at 08/04/17 [phone]  . nitroGLYCERIN (NITROSTAT) SL tablet 0.4 mg  0.4 mg Sublingual Q5 min PRN Gregor Hams, MD      . ondansetron [facility])  tablet 4 mg  4 mg Oral Q6H PRN Gladstone Lighter, MD       Or  . ondansetron [facility]) injection 4 mg  4 mg Intravenous Q6H PRN Gladstone Lighter, MD   4 mg at 08/03/17 1822  . polyethylene glycol (MIRALAX / GLYCOLAX) packet 17 g  17 g Oral Daily PRN Gladstone Lighter, MD      . tiotropium [facility]) inhalation capsule 18 mcg  18 mcg Inhalation Daily Gregor Hams, MD   18 mcg at 08/04/17 1031  . traZODone (DESYREL) tablet 100 mg  100 mg Oral  Daily Gregor Hams, MD   100 mg at 08/04/17 1791     Discharge Medications: Please see discharge summary for a list of discharge medications.  Relevant Imaging Results:  Relevant Lab Results:   Additional Information SSN [id number]  Annamaria Boots, [place]

## 2017-08-04 NOTE — Progress Notes (Signed)
Trazodone discontinued per Dr. Posey Pronto.

## 2017-08-04 NOTE — Progress Notes (Signed)
Initial Nutrition Assessment  DOCUMENTATION CODES:   Morbid obesity  INTERVENTION:  Continue Pro-Stat 30 mL BID, each supplement provides 100 kcal and 15 grams of protein.  Continue daily MVI.  NUTRITION DIAGNOSIS:   Increased nutrient needs related to catabolic illness(COPD, CHF) as evidenced by estimated needs.  GOAL:   Patient will meet greater than or equal to 90% of their needs  MONITOR:   PO intake, Supplement acceptance, Labs, Weight trends, Skin, I & O's  REASON FOR ASSESSMENT:   Malnutrition Screening Tool    ASSESSMENT:   72 year old female with PMHx of DM type 2, HTN, schizophrenia, OA, OSA, LVH, CHF, anxiety, COPD, CAD, CKD, GERD, arthritis, depression who presented from [facility] with paranoid behavior found to have AKI on CKD III.   Attempted to meet with patient at bedside but she was very lethargic and unable to stay awake long enough to provide any history. She has safety sitter at bedside due to IVC. Reviewed paper chart from [facility]. It appears she is typically on a carbohydrate-controlled, no added salt diet. She is also ordered for Pro-Stat 30 mL BID for added protein.   Per chart she was 287.8 lbs on 06/19/2016 and is currently 271.7 lbs. She has lost 16.1 lbs (5.6% body weight) over >1 year, which is not significant for time frame.  Meal Completion: 50-100%  Medications reviewed and include: vitamin D 1000 units daily, Colace, famotidine, Pro-Stat 30 mL BID, glipizide, Novolog 0-15 units TID, Novolog 0-5 units QHS, Lantus 11 units QHS, levothyroxine, MVI daily.  Labs reviewed: CBG 124-256, Potassium 5.2, BUN 33, Creatinine 1.7.  Patient does not meet criteria for malnutrition at this time.  NUTRITION - FOCUSED PHYSICAL EXAM:    Most Recent Value  Orbital Region  No depletion  Upper Arm Region  No depletion  Thoracic and Lumbar Region  No depletion  Buccal Region  No depletion  Temple Region  No depletion  Clavicle Bone Region  No  depletion  Clavicle and Acromion Bone Region  No depletion  Scapular Bone Region  No depletion  Dorsal Hand  No depletion  Patellar Region  No depletion  Anterior Thigh Region  No depletion  Posterior Calf Region  No depletion  Edema (RD Assessment)  Mild  Hair  Reviewed  Eyes  Unable to assess  Mouth  Unable to assess  Skin  Reviewed  Nails  Reviewed     Diet Order:   Diet Order           Diet Heart Room service appropriate? Yes; Fluid consistency: Thin  Diet effective now          EDUCATION NEEDS:   Not appropriate for education at this time  Skin:  Skin Assessment: Skin Integrity Issues:(MSAD to breast and groin)  Last BM:  Unknown/PTA  Height:   Ht Readings from Last 1 Encounters:  08/03/17 5\' 6"  (1.676 m)    Weight:   Wt Readings from Last 1 Encounters:  08/03/17 271 lb 11.2 oz (123.2 kg)    Ideal Body Weight:  59.1 kg  BMI:  Body mass index is 43.85 kg/m.  Estimated Nutritional Needs:   Kcal:  1940-2115 (MSJ x 1.1-1.2)  Protein:  110-120 grams (0.9-1 grams/kg)  Fluid:  1.5-1.8 L/day (25-30 mL/kg IBW)  Willey Blade, MS, RD, LDN Office: [phone] Pager: [phone] After Hours/Weekend Pager: [phone]

## 2017-08-04 NOTE — Progress Notes (Signed)
[place] [facility] [place] NAME: [name] [name]    MR#:  [id number]  DATE OF BIRTH:  [birth year]  SUBJECTIVE:   Patient was admitted from the emergency room after she had symptoms of paranoia. She intermittently takes stops taking her antipsychotics and gets paranoid ideation. She was found to have elevated creatinine in the emergency room and admitted on the medical floor. She seems to be eating and received IV fluids. She is not screaming and is not exhibiting any paranoia today. REVIEW OF SYSTEMS:   Review of Systems  Unable to perform ROS: Mental status change   Tolerating Diet:yesTolerating PT: pending  DRUG ALLERGIES:   Allergies  Allergen Reactions  . Haldol [Haloperidol Decanoate] Swelling and Other (See Comments)    Reaction:  Swelling of tongue and blurred vision    . Metformin Diarrhea  . Prednisone Other (See Comments)    Unknown reaction  . Raspberry Swelling and Other (See Comments)    Reaction:  Swelling of lips     VITALS:  Blood pressure 106/61, pulse 84, temperature 99.3 F (37.4 C), temperature source Oral, resp. rate (!) 22, height 5\' 6"  (1.676 m), weight 123.2 kg (271 lb 11.2 oz), SpO2 92 %.  PHYSICAL EXAMINATION:   Physical Exam  GENERAL:  72 y.o.-year-old patient lying in the bed with no acute distress. obese EYES: Pupils equal, round, reactive to light and accommodation. No scleral icterus. Extraocular muscles intact.  HEENT: Head atraumatic, normocephalic. Oropharynx and nasopharynx clear.  NECK:  Supple, no jugular venous distention. No thyroid enlargement, no tenderness.  LUNGS: Normal breath sounds bilaterally, no wheezing, rales, rhonchi. No use of accessory muscles of respiration.  CARDIOVASCULAR: S1, S2 normal. No murmurs, rubs, or gallops.  ABDOMEN: Soft, nontender, nondistended. Bowel sounds present. No organomegaly or mass.  EXTREMITIES: No cyanosis, clubbing or edema b/l.    NEUROLOGIC: Cranial  nerves II through XII are intact. Grossly no focal Motor or sensory deficits b/l.   PSYCHIATRIC:  patient is alert at present.   SKIN: No obvious rash, lesion, or ulcer.   LABORATORY PANEL:  CBC Recent Labs  Lab 08/04/17 0427  WBC 9.1  HGB 12.0  HCT 37.5  PLT 230    Chemistries  Recent Labs  Lab 08/03/17 1205 08/04/17 0427  NA 140 137  K 4.8 5.2*  CL 101 104  CO2 30 28  GLUCOSE 181* 163*  BUN 32* 33*  CREATININE 2.07* 1.70*  CALCIUM 8.9 8.1*  AST 28  --   ALT 22  --   ALKPHOS 65  --   BILITOT 0.5  --    Cardiac Enzymes Recent Labs  Lab 08/03/17 1205  TROPONINI <0.03   RADIOLOGY:  Dg Chest 2 View  Result Date: 08/04/2017 CLINICAL DATA:  Hypoxia EXAM: CHEST - 2 VIEW COMPARISON:  08/03/2017 FINDINGS: Pain cardiomegaly. Low lung volumes with bibasilar atelectasis. No overt edema or effusions. No acute bony abnormality. IMPRESSION: Cardiomegaly. Low lung volumes with bibasilar atelectasis. Electronically Signed   By: Rolm Baptise M.D.   On: 08/04/2017 07:39   Dg Chest Portable 1 View  Result Date: 08/03/2017 CLINICAL DATA:  SOB. Pt was feeling discomfort and oxygen was low. Chaplain found her to her to have less energy from previous encounters. EXAM: PORTABLE CHEST 1 VIEW COMPARISON:  05/02/2017 FINDINGS: Mildly tortuous thoracic aorta. Upper normal heart size for projection. Mediastinal contours otherwise unremarkable. The lungs appear clear. IMPRESSION: 1.  No active cardiopulmonary disease is radiographically  apparent. 2. Tortuous thoracic aorta. Electronically Signed   By: Van Clines M.D.   On: 08/03/2017 14:53   ASSESSMENT AND PLAN:  [name] [name]  is a 72 y.o. female with a known history of schizophrenia with paranoid behavior, hypertension, diabetes, hypertrophic cardiomyopathy with congestive heart failure, depression, CAD presents from [facility] and has been in the emergency room since 07/27/2017 for paranoid behavior.  1.  Acute renal failure on CKD  stage III.   -Could have been dehydration while waiting in the emergency room.  Has not received any fluids up until today.  Intake has been pretty decent. -We will hold her lisinopril -Will need gentle hydration instead of a bolus.  Avoid nephrotoxins -Hold her antihypertensives and monitor renal function.   -creat 2.07--1.7 -baseline creat 0.9  2.  Hypoxia-secondary to acute pulmonary edema from IV fluid bolus.  Known history of hypertrophic cardiomyopathy.  Avoid hypotension -Very sensitive to fluids.  Restart Lasix if renal function improves tomorrow. -On 2 L oxygen, chest x-ray shows low lung volumes -Stop IV fluids.  Incentive spirometry and monitor -sats 93% on RA  3.  Schizophrenia with paranoid behavior-on involuntary commitment.   =Psych follow-up-- spoke with Dr. Weber Cooks. According to him once patient's paranoia is stable she can go back to the group home.  Continue all her antipsychotics.  Pleasant at this time  4.  Diabetes mellitus-continue Lantus and sliding scale insulin.  Also on glipizide  5.  Hypothyroidism-continue Synthroid  6.  Hypertension-hold antihypertensives due to hypotension today  7.  DVT prophylaxis-subcutaneous heparin.  Physical therapy consult and social worker consult requested.   Sitter in the room due to IVC  Case discussed with Care Management/Social Worker.   CODE STATUS: full  DVT Prophylaxis: heparin  TOTAL TIME TAKING CARE OF THIS PATIENT: *30* minutes.  >50% time spent on counselling and coordination of care  POSSIBLE D/C IN *1-2* DAYS, DEPENDING ON CLINICAL CONDITION.  Note: This dictation was prepared with Dragon dictation along with smaller phrase technology. Any transcriptional errors that result from this process are unintentional.  Fritzi Mandes M.D on 08/04/2017 at 1:13 PM  Between 7am to 6pm - Pager - [phone]  After 6pm go to [email or web address] - password [email or web address]  Office   [phone]  CC: Primary care physician; Patient, No Pcp PerPatient ID: [name] [name], female   DOB: 19-Dec-1945, 72 y.o.   MRN: [id number]

## 2017-08-04 NOTE — Progress Notes (Signed)
PT Cancellation Note  Patient Details Name: [name] [name] MRN: [id number] DOB: 08/18/1945   Cancelled Treatment:    Reason Eval/Treat Not Completed: Medical issues which prohibited therapy; pt's Ka currently 5.2 and trending up which is outside guidelines for participation with PT services.  Will attempt to see pt at a future date/time as medically appropriate.    Linus Salmons PT, DPT 08/04/17, 1:43 PM

## 2017-08-05 DIAGNOSIS — F250 Schizoaffective disorder, bipolar type: Secondary | ICD-10-CM | POA: Diagnosis not present

## 2017-08-05 DIAGNOSIS — N179 Acute kidney failure, unspecified: Secondary | ICD-10-CM | POA: Diagnosis present

## 2017-08-05 LAB — BASIC METABOLIC PANEL
Anion gap: 8 (ref 5–15)
BUN: 23 mg/dL — AB (ref 6–20)
CHLORIDE: 101 mmol/L (ref 101–111)
CO2: 28 mmol/L (ref 22–32)
Calcium: 8.7 mg/dL — ABNORMAL LOW (ref 8.9–10.3)
Creatinine, Ser: 1.19 mg/dL — ABNORMAL HIGH (ref 0.44–1.00)
GFR calc Af Amer: 52 mL/min — ABNORMAL LOW (ref >60)
GFR calc non Af Amer: 44 mL/min — ABNORMAL LOW (ref >60)
GLUCOSE: 171 mg/dL — AB (ref 65–99)
POTASSIUM: 4.5 mmol/L (ref 3.5–5.1)
Sodium: 137 mmol/L (ref 135–145)

## 2017-08-05 LAB — GLUCOSE, CAPILLARY
GLUCOSE-CAPILLARY: 157 mg/dL — AB (ref 65–99)
Glucose-Capillary: 116 mg/dL — ABNORMAL HIGH (ref 65–99)
Glucose-Capillary: 176 mg/dL — ABNORMAL HIGH (ref 65–99)
Glucose-Capillary: 157 mg/dL — ABNORMAL HIGH (ref 65–99)

## 2017-08-05 NOTE — Plan of Care (Signed)

## 2017-08-05 NOTE — Care Management Important Message (Signed)
Copy of signed IM left with patient in room.  

## 2017-08-05 NOTE — Progress Notes (Signed)
   08/05/17 1530  Clinical Encounter Type  Visited With Patient  Visit Type Psychological support;Spiritual support  Referral From Nurse  Consult/Referral To Chaplain  Spiritual Encounters  Spiritual Needs Prayer;Emotional   [place] was requested to visit with PT. PT is well known to [facility] staff as she has been at [facility] since 07/28/[facility]. PT is very kind but also very emotional. PT stated that her son was a Pharmacist, hospital and she has two sons and two grandchildren. This is the first encounter this [place] has had that she spoke of her grandchildren. CH encouraged PT and prayed with her.
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
## 2017-08-05 NOTE — Care Management Obs Status (Signed)
[place] NOTIFICATION   Patient Details  Name: [name] [name] MRN: [id number] Date of Birth: 1945/08/05   Medicare Observation Status Notification Given:  Yes [name] son    [name], RN 08/05/2017, 3:41 PM

## 2017-08-05 NOTE — Care Management CC44 (Signed)
Condition Code 44 Documentation Completed  Patient Details  Name: [name] [name] MRN: [id number] Date of Birth: 06-08-1945   Condition Code 44 given:  Yes Patient signature on Condition Code 44 notice:  Yes Documentation of 2 MD's agreement:  Yes Code 44 added to claim:  Yes    [name], RN 08/05/2017, 3:41 PM

## 2017-08-05 NOTE — Evaluation (Signed)
Physical Therapy Evaluation Patient Details Name: [name] [name] MRN: [id number] DOB: [birth year] Today's Date: 08/05/2017   History of Present Illness  72 y.o. female presenting to ED on 6/5 for paranoid episode, admitted to hospital 6/12 w/ acute on chronic renal disease stage 3, hypoxia secondary to pulmonary edema, and schizophrenia. PMH includes arthritis, bronchitis, CAD, CHF, chronic cough, COPD, GERD, and left ventricular hypertrophy.  Clinical Impression  Prior to hospital admission, pt was walking in the ED w/ a RW according to sitter present in room.  Pt lives at [facility], and had a paranoid episode and wished to leave.  Currently pt is mod assist +2 for bed mobility and min/mod assist +2 for transfers. She presents with general weakness and decreased activity tolerance. Her O2 when on EOB was 91% and supine in bed following activity was 91-92% (on room air). Pt attempted to march in place w/ RW but almost immediately had to sit down. She required mod assist to support her trunk when sitting on EOB d/t posterior lean. She performed bed mobility with great effort and needed several rest breaks when going from supine to sit as well as support for her trunk and legs. Pt would benefit from skilled PT to address noted impairments and functional limitations (see below for any additional details).  Upon hospital discharge, recommend pt discharge to SNF.    Follow Up Recommendations SNF    Equipment Recommendations  Rolling walker with 5" wheels    Recommendations for Other Services OT consult     Precautions / Restrictions Precautions Precautions: Fall Restrictions Weight Bearing Restrictions: No      Mobility  Bed Mobility Overal bed mobility: Needs Assistance Bed Mobility: Supine to Sit;Sit to Supine     Supine to sit: Mod assist;HOB elevated Sit to supine: +2 for physical assistance;Mod assist;HOB elevated   General bed mobility comments: Pt required assist to support  trunk and LE for bed mobility; able to push with B UE and assist when performing supine to sit to transfer, able to push with B LE to assist with scooting upward on bed  Transfers Overall transfer level: Needs assistance Equipment used: Rolling walker (2 wheeled) Transfers: Sit to/from Stand Sit to Stand: Mod assist;Min assist;+2 physical assistance         General transfer comment: Transfer trial x2: 1st trial pt attempted to march once standing and immediately had to sit back down, 2nd trial pt got to standing and sat back down again almost immediately  Ambulation/Gait  Attempted ambulation, pt unable to march in place w/ +2 assist and RW.              Stairs            Wheelchair Mobility    Modified Rankin (Stroke Patients Only)       Balance Overall balance assessment: Needs assistance Sitting-balance support: Single extremity supported;Feet supported Sitting balance-Leahy Scale: Poor Sitting balance - Comments: Pt has one hand on bed rail to maintain sitting upright w/ support. Pt required mod assist trunk support from therapist to maintain upright sitting balance. Pt has posterior lean.   Standing balance support: Bilateral upper extremity supported Standing balance-Leahy Scale: Zero Standing balance comment: Pt could not maintain standing and immediately has to sit back down, can not perform march when standing                             Pertinent Vitals/Pain Pain  Assessment: Faces Faces Pain Scale: Hurts whole lot Pain Location: Pt gestured to L shoulder but unable to pinpoint location Pain Descriptors / Indicators: Grimacing;Discomfort Pain Intervention(s): Limited activity within patient's tolerance;Monitored during session, Nursing notified    Home Living Family/patient expects to be discharged to:: Skilled nursing facility Living Arrangements: Other (Comment)               Additional Comments: She is a resident of [facility], reports that she does not want to go back and says they caused her pain    Prior Function Level of Independence: Needs assistance         Comments: pt unable to remember PLOF for functional mobility, sitter notified pt was walking w/ RW in ED     Hand Dominance        Extremity/Trunk Assessment   Upper Extremity Assessment Upper Extremity Assessment: Generalized weakness(Pt R side 3/5 for shoulder flexion, elbow extension/flexion; pt reports left side is generally weaker. Limited L elbow flexion/extension and shoulder flexion, able to use left hand to grip bed rail to help w/ sitting balance )    Lower Extremity Assessment Lower Extremity Assessment: Generalized weakness(Unable to perform B hip flexion on EOB, 3/5 for B knee flexion/extension, can do dorsiflexion/plantarflexion w/ AAROM)    Cervical / Trunk Assessment Cervical / Trunk Assessment: Normal  Communication   Communication: Other (comment)(Pt talks with difficulty, talking better by end of session)  Cognition Arousal/Alertness: Lethargic Behavior During Therapy: WFL for tasks assessed/performed Overall Cognitive Status: Within Functional Limits for tasks assessed                                        General Comments      Exercises     Assessment/Plan    PT Assessment Patient needs continued PT services  PT Problem List Decreased strength;Decreased range of motion;Decreased activity tolerance;Decreased balance;Decreased mobility;Decreased knowledge of precautions;Decreased safety awareness;Cardiopulmonary status limiting activity       PT Treatment Interventions Gait training;Stair training;Functional mobility training;Therapeutic activities;Therapeutic exercise;Balance training;Patient/family education    PT Goals (Current goals can be found in the Care Plan section)  Acute Rehab PT Goals Patient Stated Goal: to walk PT Goal Formulation: With patient Time For Goal  Achievement: 08/19/17 Potential to Achieve Goals: Good    Frequency Min 2X/week   Barriers to discharge        Co-evaluation               AM-PAC PT "6 Clicks" Daily Activity  Outcome Measure Difficulty turning over in bed (including adjusting bedclothes, sheets and blankets)?: Unable Difficulty moving from lying on back to sitting on the side of the bed? : Unable Difficulty sitting down on and standing up from a chair with arms (e.g., wheelchair, bedside commode, etc,.)?: Unable Help needed moving to and from a bed to chair (including a wheelchair)?: Total Help needed walking in hospital room?: Total Help needed climbing 3-5 steps with a railing? : Total 6 Click Score: 6    End of Session Equipment Utilized During Treatment: Gait belt Activity Tolerance: Patient limited by fatigue Patient left: in bed;with bed alarm set;with nursing/sitter in room;with call bell/phone within reach;with SCD's reapplied Nurse Communication: Mobility status;Precautions;Other (comment)(Nursing notified of O2 status during session) PT Visit Diagnosis: Muscle weakness (generalized) (M62.81);History of falling (Z91.81);Difficulty in walking, not elsewhere classified (R26.2);Unsteadiness on feet (R26.81)    Time:  3612-2449 PT Time Calculation (min) (ACUTE ONLY): 40 min   Charges:         PT G CodesChana Bode, SPT 08-Aug-2017, 2:00 PM

## 2017-08-05 NOTE — Progress Notes (Signed)
[name] [name] at [place] NAME: [name] [name]    MR#:  [id number]  DATE OF BIRTH:  05-07-1945  SUBJECTIVE:   Pt. here due to altered mental status and hallucinations.  Still somewhat lethargic today.  Renal function has improved since yesterday. Pt. Complaining of some left arm pain.    REVIEW OF SYSTEMS:    Review of Systems  Unable to perform ROS: Mental acuity    Nutrition: Heart Healthy Tolerating Diet: Yes Tolerating PT: Await Eval.   DRUG ALLERGIES:   Allergies  Allergen Reactions  . Haldol [Haloperidol Decanoate] Swelling and Other (See Comments)    Reaction:  Swelling of tongue and blurred vision    . Metformin Diarrhea  . Prednisone Other (See Comments)    Unknown reaction  . Raspberry Swelling and Other (See Comments)    Reaction:  Swelling of lips     VITALS:  Blood pressure 132/64, pulse 72, temperature 97.9 F (36.6 C), temperature source Oral, resp. rate 16, height 5\' 6"  (1.676 m), weight 123.2 kg (271 lb 11.2 oz), SpO2 94 %.  PHYSICAL EXAMINATION:   Physical Exam  GENERAL:  72 y.o.-year-old obese patient lying in bed lethargic/enceophalopathic but in NAD.  EYES: Pupils equal, round, reactive to light and accommodation. No scleral icterus. Extraocular muscles intact.  HEENT: Head atraumatic, normocephalic. Oropharynx and nasopharynx clear.  NECK:  Supple, no jugular venous distention. No thyroid enlargement, no tenderness.  LUNGS: Normal breath sounds bilaterally, no wheezing, rales, rhonchi. No use of accessory muscles of respiration.  CARDIOVASCULAR: S1, S2 normal. No murmurs, rubs, or gallops.  ABDOMEN: Soft, nontender, nondistended. Bowel sounds present. No organomegaly or mass.  EXTREMITIES: No cyanosis, clubbing or edema b/l.    NEUROLOGIC: Cranial nerves II through XII are intact. No focal Motor or sensory deficits b/l.  Globally weak.  PSYCHIATRIC: The patient is alert and oriented x 1.  SKIN: No obvious  rash, lesion, or ulcer.    LABORATORY PANEL:   CBC Recent Labs  Lab 08/04/17 0427  WBC 9.1  HGB 12.0  HCT 37.5  PLT 230   ------------------------------------------------------------------------------------------------------------------  Chemistries  Recent Labs  Lab 08/03/17 1205  08/05/17 0924  NA 140   < > 137  K 4.8   < > 4.5  CL 101   < > 101  CO2 30   < > 28  GLUCOSE 181*   < > 171*  BUN 32*   < > 23*  CREATININE 2.07*   < > 1.19*  CALCIUM 8.9   < > 8.7*  AST 28  --   --   ALT 22  --   --   ALKPHOS 65  --   --   BILITOT 0.5  --   --    < > = values in this interval not displayed.   ------------------------------------------------------------------------------------------------------------------  Cardiac Enzymes Recent Labs  Lab 08/03/17 1205  TROPONINI <0.03   ------------------------------------------------------------------------------------------------------------------  RADIOLOGY:  Dg Chest 2 View  Result Date: 08/04/2017 CLINICAL DATA:  Hypoxia EXAM: CHEST - 2 VIEW COMPARISON:  08/03/2017 FINDINGS: Pain cardiomegaly. Low lung volumes with bibasilar atelectasis. No overt edema or effusions. No acute bony abnormality. IMPRESSION: Cardiomegaly. Low lung volumes with bibasilar atelectasis. Electronically Signed   By: Rolm Baptise M.D.   On: 08/04/2017 07:39   Dg Chest Portable 1 View  Result Date: 08/03/2017 CLINICAL DATA:  SOB. Pt was feeling discomfort and oxygen was low. Chaplain found her to her to have  less energy from previous encounters. EXAM: PORTABLE CHEST 1 VIEW COMPARISON:  05/02/2017 FINDINGS: Mildly tortuous thoracic aorta. Upper normal heart size for projection. Mediastinal contours otherwise unremarkable. The lungs appear clear. IMPRESSION: 1.  No active cardiopulmonary disease is radiographically apparent. 2. Tortuous thoracic aorta. Electronically Signed   By: Van Clines M.D.   On: 08/03/2017 14:53     ASSESSMENT AND PLAN:    [place] a71 y.o.femalewith a known history of schizophrenia with paranoid behavior, hypertension, diabetes, hypertrophic cardiomyopathy with congestive heart failure, depression, CAD presents from [facility] and has been in the emergency room since 07/27/2017 for paranoid behavior.  1.Acute renal failure on CKD stage III.  -This is much improved with IV fluid hydration, creatinine close to baseline down to 1.1 today. -We will hold her lisinopril and follow BUN/Cr. And urine output.   2. Hypoxia - resolved presently.  Thought to be due to Pulm. Edema but pt. Is not volume overloaded.  - will hold Lasix for now.  - on RA.  Cont. Incentive spirometry.   3. Schizophrenia with paranoid behavior- on involuntary commitment.  - appreciate Psych input no need for inpatient Psycn presently.  - cont. Current Psych meds with Proloxin, depakote, Cogentin.    4. Diabetes mellitus-continue Lantus, SSI and glipizide.  - BS stable.   5. Hypothyroidism-continue Synthroid  6. Hypertension- BP meds on hold for now.  Will monitor.    Await PT eval and discussed with social work and pt. Cannot go back [facility] as pt. Has lost her bed there. Possible GERI-psych placement.    All the records are reviewed and case discussed with Care Management/Social Worker. Management plans discussed with the patient, family and they are in agreement.  CODE STATUS: Full code  DVT Prophylaxis: Hep SQ  TOTAL TIME TAKING CARE OF THIS PATIENT: 30 minutes.   POSSIBLE D/C IN 1-2 DAYS, DEPENDING ON CLINICAL CONDITION.   [name] [name] M.D on 08/05/2017 at 1:21 PM  Between 7am to 6pm - Pager - [phone]  After 6pm go to [email or web address] - Proofreader  [facility]  Office  [phone]  CC: Primary care physician; Patient, No Pcp Per

## 2017-08-05 NOTE — Progress Notes (Signed)
   08/05/17 2200  Clinical Encounter Type  Visited With Patient  Visit Type Follow-up;Spiritual support  Referral From Nurse  Consult/Referral To Chaplain  Spiritual Encounters  Spiritual Needs Prayer;Emotional  Stress Factors  Patient Stress Factors Family relationships  Chaplain received PG to visit Pt. Chaplain arrived at room and Pt was happy to see her. Pt looked well from last time Chaplain seen PT. Chaplain told PT how good she look and how happy Pt seemed. Pt agreed with Chaplain. Chaplain received another PG and prayed with Pt and said a Chaplain will be back tomorrow.

## 2017-08-06 DIAGNOSIS — F250 Schizoaffective disorder, bipolar type: Secondary | ICD-10-CM | POA: Diagnosis not present

## 2017-08-06 LAB — BASIC METABOLIC PANEL
Anion gap: 6 (ref 5–15)
BUN: 20 mg/dL (ref 6–20)
CO2: 30 mmol/L (ref 22–32)
CREATININE: 1.12 mg/dL — AB (ref 0.44–1.00)
Calcium: 8.5 mg/dL — ABNORMAL LOW (ref 8.9–10.3)
Chloride: 102 mmol/L (ref 101–111)
GFR calc Af Amer: 55 mL/min — ABNORMAL LOW (ref >60)
GFR, EST NON AFRICAN AMERICAN: 48 mL/min — AB (ref >60)
Glucose, Bld: 109 mg/dL — ABNORMAL HIGH (ref 65–99)
POTASSIUM: 4.6 mmol/L (ref 3.5–5.1)
SODIUM: 138 mmol/L (ref 135–145)

## 2017-08-06 LAB — GLUCOSE, CAPILLARY
GLUCOSE-CAPILLARY: 94 mg/dL (ref 65–99)
GLUCOSE-CAPILLARY: 229 mg/dL — AB (ref 65–99)
Glucose-Capillary: 156 mg/dL — ABNORMAL HIGH (ref 65–99)
Glucose-Capillary: 106 mg/dL — ABNORMAL HIGH (ref 65–99)

## 2017-08-06 MED ORDER — FUROSEMIDE 20 MG PO TABS
20.0000 mg | ORAL_TABLET | Freq: Every day | ORAL | Status: DC
  Administered 2017-08-06 – 2017-08-16 (×11): 20 mg via ORAL
  Filled 2017-08-06 (×12): qty 1

## 2017-08-06 MED ORDER — LISINOPRIL 10 MG PO TABS
10.0000 mg | ORAL_TABLET | Freq: Every day | ORAL | Status: DC
Start: 2017-08-06 — End: 2017-08-13
  Administered 2017-08-06 – 2017-08-12 (×7): 10 mg via ORAL
  Filled 2017-08-06 (×7): qty 1

## 2017-08-06 NOTE — Plan of Care (Signed)
  Problem: Elimination: Goal: Will not experience complications related to bowel motility Outcome: Progressing  Pt incontinent of stool this shift x 2 Problem: Pain Managment: Goal: General experience of comfort will improve Outcome: Progressing  PRN Tylenol given for c/o joint pain per pt Problem: Safety: Goal: Ability to remain free from injury will improve Outcome: Progressing  Pt remains on High Fall Risks; has sitter at bedside d/t IVC order in [facility]

## 2017-08-06 NOTE — Progress Notes (Signed)
   08/06/17 2300  Clinical Encounter Type  Visited With Patient  Visit Type Follow-up   Chaplain provided emotional support and prayer.

## 2017-08-06 NOTE — Progress Notes (Signed)
   08/06/17 0900  Clinical Encounter Type  Visited With Patient  Visit Type Follow-up   Patient requested a living will.  Chaplain attempted to educate patient about AD but patient did not demonstrate understanding.  Sitter says there is a legal guardian who would need to be consulted in order to proceed.  Copy of AD left with patient but at this time Chaplain does not believe patient is competent to make these decisions.  Chaplain provided emotional support, active listening, and prayer.

## 2017-08-06 NOTE — Progress Notes (Signed)
Patient requested to call her children,so called daughter no voicemail set up and called son left a message. Marland Kitchen

## 2017-08-06 NOTE — Progress Notes (Signed)
-  Patient requested to call niece Denman George reached voicemail left a message to direct phone number to patients room.

## 2017-08-06 NOTE — Progress Notes (Signed)
[place] at [place] NAME: [name] [name]    MR#:  [id number]  DATE OF BIRTH:  Jul 27, 1945  SUBJECTIVE:   Pt. is much more awake today.  Renal function back to baseline.  Follows simple commands.  No other acute events overnight.  Awaiting Geri-psych placement.   REVIEW OF SYSTEMS:    Review of Systems  Constitutional: Negative for chills and fever.  HENT: Negative for congestion and tinnitus.   Eyes: Negative for blurred vision and double vision.  Respiratory: Negative for cough, shortness of breath and wheezing.   Cardiovascular: Negative for chest pain, orthopnea and PND.  Gastrointestinal: Negative for abdominal pain, diarrhea, nausea and vomiting.  Genitourinary: Negative for dysuria and hematuria.  Neurological: Negative for dizziness, sensory change and focal weakness.  All other systems reviewed and are negative.   Nutrition: Heart Healthy Tolerating Diet: Yes Tolerating PT: eval noted.    DRUG ALLERGIES:   Allergies  Allergen Reactions  . Haldol [Haloperidol Decanoate] Swelling and Other (See Comments)    Reaction:  Swelling of tongue and blurred vision    . Metformin Diarrhea  . Prednisone Other (See Comments)    Unknown reaction  . Raspberry Swelling and Other (See Comments)    Reaction:  Swelling of lips     VITALS:  Blood pressure 128/64, pulse 75, temperature 98.2 F (36.8 C), temperature source Oral, resp. rate 18, height 5\' 6"  (1.676 m), weight 123.2 kg (271 lb 11.2 oz), SpO2 96 %.  PHYSICAL EXAMINATION:   Physical Exam  GENERAL:  72 y.o.-year-old obese patient lying in bed alert but in NAD EYES: Pupils equal, round, reactive to light and accommodation. No scleral icterus. Extraocular muscles intact.  HEENT: Head atraumatic, normocephalic. Oropharynx and nasopharynx clear.  NECK:  Supple, no jugular venous distention. No thyroid enlargement, no tenderness.  LUNGS: Normal breath sounds bilaterally, no  wheezing, rales, rhonchi. No use of accessory muscles of respiration.  CARDIOVASCULAR: S1, S2 normal. No murmurs, rubs, or gallops.  ABDOMEN: Soft, nontender, nondistended. Bowel sounds present. No organomegaly or mass.  EXTREMITIES: No cyanosis, clubbing or edema b/l.    NEUROLOGIC: Cranial nerves II through XII are intact. No focal Motor or sensory deficits b/l.  Globally weak.  PSYCHIATRIC: The patient is alert and oriented x 1.  SKIN: No obvious rash, lesion, or ulcer.    LABORATORY PANEL:   CBC Recent Labs  Lab 08/04/17 0427  WBC 9.1  HGB 12.0  HCT 37.5  PLT 230   ------------------------------------------------------------------------------------------------------------------  Chemistries  Recent Labs  Lab 08/03/17 1205  08/06/17 0522  NA 140   < > 138  K 4.8   < > 4.6  CL 101   < > 102  CO2 30   < > 30  GLUCOSE 181*   < > 109*  BUN 32*   < > 20  CREATININE 2.07*   < > 1.12*  CALCIUM 8.9   < > 8.5*  AST 28  --   --   ALT 22  --   --   ALKPHOS 65  --   --   BILITOT 0.5  --   --    < > = values in this interval not displayed.   ------------------------------------------------------------------------------------------------------------------  Cardiac Enzymes Recent Labs  Lab 08/03/17 1205  TROPONINI <0.03   ------------------------------------------------------------------------------------------------------------------  RADIOLOGY:  No results found.   ASSESSMENT AND PLAN:   [place] a53 y.o.femalewith a known history of schizophrenia with paranoid behavior, hypertension,  diabetes, hypertrophic cardiomyopathy with congestive heart failure, depression, CAD presents from [facility] and has been in the emergency room since 07/27/2017 for paranoid behavior.  1.Acute renal failure on CKD stage III.  -This is much improved with IV fluid hydration, creatinine close to baseline down to 1.1.   -I will resume her lisinopril as creatinine is  improved.  Follow BUN and creatinine.  2. Hypoxia - resolved presently.  Thought to be due to Pulm. Edema but pt. Is not volume overloaded presently.  - will resume her Lasix as Renal function improved and pt. Is taking PO.  - on RA.  Cont. Incentive spirometry.   3. Schizophrenia with paranoid behavior- on involuntary commitment.  - appreciate Psych input no need for inpatient Psycn presently.  - cont. Current Psych meds with Proloxin, depakote, Cogentin.    4. Diabetes mellitus-continue Lantus, SSI and glipizide.  - BS stable.   5. Hypothyroidism-continue Synthroid  6. Hypertension - will resume Lisinopril.  Follow BP  Awaiting GERI-Psych placement.    All the records are reviewed and case discussed with Care Management/Social Worker. Management plans discussed with the patient, family and they are in agreement.  CODE STATUS: Full code  DVT Prophylaxis: Hep SQ  TOTAL TIME TAKING CARE OF THIS PATIENT: 25 minutes.   POSSIBLE D/C IN 2-3 DAYS, DEPENDING ON CLINICAL CONDITION.   [name] M.D on 08/06/2017 at 11:38 AM  Between 7am to 6pm - Pager - [phone]  After 6pm go to [email or web address] - Proofreader  [facility]  Office  [phone]  CC: Primary care physician; Patient, No Pcp Per

## 2017-08-07 DIAGNOSIS — F250 Schizoaffective disorder, bipolar type: Secondary | ICD-10-CM | POA: Diagnosis not present

## 2017-08-07 LAB — GLUCOSE, CAPILLARY
GLUCOSE-CAPILLARY: 117 mg/dL — AB (ref 65–99)
GLUCOSE-CAPILLARY: 209 mg/dL — AB (ref 65–99)
GLUCOSE-CAPILLARY: 190 mg/dL — AB (ref 65–99)
Glucose-Capillary: 166 mg/dL — ABNORMAL HIGH (ref 65–99)

## 2017-08-07 LAB — BASIC METABOLIC PANEL
Anion gap: 7 (ref 5–15)
BUN: 19 mg/dL (ref 6–20)
CHLORIDE: 101 mmol/L (ref 101–111)
CO2: 31 mmol/L (ref 22–32)
Calcium: 8.8 mg/dL — ABNORMAL LOW (ref 8.9–10.3)
Creatinine, Ser: 1.12 mg/dL — ABNORMAL HIGH (ref 0.44–1.00)
GFR calc Af Amer: 55 mL/min — ABNORMAL LOW (ref >60)
GFR, EST NON AFRICAN AMERICAN: 48 mL/min — AB (ref >60)
Glucose, Bld: 98 mg/dL (ref 65–99)
Potassium: 4.2 mmol/L (ref 3.5–5.1)
Sodium: 139 mmol/L (ref 135–145)

## 2017-08-07 NOTE — Progress Notes (Signed)
[facility] [place] NAME: [name] [name]    MR#:  [id number]  DATE OF BIRTH:  [birth year]  SUBJECTIVE:   Pt. Is tearful today and says she does not want to go back to [facility].  Renal function stable.  No other acute events overnight.   REVIEW OF SYSTEMS:    Review of Systems  Constitutional: Negative for chills and fever.  HENT: Negative for congestion and tinnitus.   Eyes: Negative for blurred vision and double vision.  Respiratory: Negative for cough, shortness of breath and wheezing.   Cardiovascular: Negative for chest pain, orthopnea and PND.  Gastrointestinal: Negative for abdominal pain, diarrhea, nausea and vomiting.  Genitourinary: Negative for dysuria and hematuria.  Neurological: Negative for dizziness, sensory change and focal weakness.  All other systems reviewed and are negative.   Nutrition: Heart Healthy Tolerating Diet: Yes Tolerating PT: eval noted.    DRUG ALLERGIES:   Allergies  Allergen Reactions  . Haldol [Haloperidol Decanoate] Swelling and Other (See Comments)    Reaction:  Swelling of tongue and blurred vision    . Metformin Diarrhea  . Prednisone Other (See Comments)    Unknown reaction  . Raspberry Swelling and Other (See Comments)    Reaction:  Swelling of lips     VITALS:  Blood pressure (!) 154/72, pulse 72, temperature 97.7 F (36.5 C), temperature source Oral, resp. rate 19, height 5\' 6"  (1.676 m), weight 123.2 kg (271 lb 11.2 oz), SpO2 93 %.  PHYSICAL EXAMINATION:   Physical Exam  GENERAL:  72 y.o.-year-old obese patient lying in bed alert but in NAD EYES: Pupils equal, round, reactive to light and accommodation. No scleral icterus. Extraocular muscles intact.  HEENT: Head atraumatic, normocephalic. Oropharynx and nasopharynx clear.  NECK:  Supple, no jugular venous distention. No thyroid enlargement, no tenderness.  LUNGS: Normal breath sounds bilaterally, no wheezing, rales,  rhonchi. No use of accessory muscles of respiration.  CARDIOVASCULAR: S1, S2 normal. No murmurs, rubs, or gallops.  ABDOMEN: Soft, nontender, nondistended. Bowel sounds present. No organomegaly or mass.  EXTREMITIES: No cyanosis, clubbing or edema b/l.    NEUROLOGIC: Cranial nerves II through XII are intact. No focal Motor or sensory deficits b/l.  Globally weak.  PSYCHIATRIC: The patient is alert and oriented x 1.  Tearful at times SKIN: No obvious rash, lesion, or ulcer.    LABORATORY PANEL:   CBC Recent Labs  Lab 08/04/17 0427  WBC 9.1  HGB 12.0  HCT 37.5  PLT 230   ------------------------------------------------------------------------------------------------------------------  Chemistries  Recent Labs  Lab 08/03/17 1205  08/07/17 0526  NA 140   < > 139  K 4.8   < > 4.2  CL 101   < > 101  CO2 30   < > 31  GLUCOSE 181*   < > 98  BUN 32*   < > 19  CREATININE 2.07*   < > 1.12*  CALCIUM 8.9   < > 8.8*  AST 28  --   --   ALT 22  --   --   ALKPHOS 65  --   --   BILITOT 0.5  --   --    < > = values in this interval not displayed.   ------------------------------------------------------------------------------------------------------------------  Cardiac Enzymes Recent Labs  Lab 08/03/17 1205  TROPONINI <0.03   ------------------------------------------------------------------------------------------------------------------  RADIOLOGY:  No results found.   ASSESSMENT AND PLAN:   [name] [name] a81 y.o.femalewith a known history of schizophrenia  with paranoid behavior, hypertension, diabetes, hypertrophic cardiomyopathy with congestive heart failure, depression, CAD presents from [facility] and has been in the emergency room since 07/27/2017 for paranoid behavior.  1.Acute renal failure on CKD stage III.  - much improved and renal function close to baseline now.   - cont. Lisinopril.   2. Hypoxia - resolved presently.  Thought to be due to Pulm.  Edema but pt. Is not volume overloaded presently.  - cont. Lasix. Incentive Spirometry.    3. Schizophrenia with paranoid behavior- on involuntary commitment.  - appreciate Psych input no need for inpatient Psycn presently.  - cont. Current Psych meds with Proloxin, depakote, Cogentin.    4. Diabetes mellitus-continue Lantus, SSI and glipizide.  - BS stable.   5. Hypothyroidism-continue Synthroid  6. Hypertension - cont. Lisinopril.    Awaiting GERI-Psych placement.    All the records are reviewed and case discussed with Care Management/Social Worker. Management plans discussed with the patient, family and they are in agreement.  CODE STATUS: Full code  DVT Prophylaxis: Hep SQ  TOTAL TIME TAKING CARE OF THIS PATIENT: 25 minutes.   POSSIBLE D/C IN 2-3 DAYS, DEPENDING ON CLINICAL CONDITION.   [name] M.D on 08/07/2017 at 10:40 AM  Between 7am to 6pm - Pager - [phone]  After 6pm go to [email or web address] - Proofreader  [facility]  Office  [phone]  CC: Primary care physician; Patient, No Pcp Per

## 2017-08-07 NOTE — Progress Notes (Signed)
-  Pt requested to call children. Reached kelly, and reached  [employer] . Pt talking with kelly.

## 2017-08-07 NOTE — Progress Notes (Signed)
Chaplain received call from unit secretary on behalf of patient's nurse. Nurse requested visit from [place]. Chaplain talked with patient about Father's Day and her sons, whom she'd called to wish well earlier this morning.  Patient requested prayer for her sons (in [place] and [place]), for her own health and specifically that her children would not "end up like her". Chaplain agreed to pray and asked if patient would like to read scripture together and/or do devotion first. Chaplain asked patient her favorite scriptures and patient named the book of Psalms. Chaplain read the 121st and 62nd Psalms and listened to patient's comments about what those meant to her, then prayed for the aforementioned spiritual needs. Patient requested placement of anointing oil; chaplain agreed to try her best to return later today.    08/07/17 1200  Clinical Encounter Type  Visited With Patient  Visit Type Spiritual support  Spiritual Encounters  Spiritual Needs Prayer;Emotional  Stress Factors  Family Stress Factors Family relationships

## 2017-08-07 NOTE — Progress Notes (Signed)
Chaplain received a call from patient's nurse to visit. Upon entering the room the patient's face lit up with a bright smile. Patient said that she'd had a lovely day and had a chance to wish both sons a happy Father's Day. Patient is concerned that she will be moved to a facility that may be too far away from her sons. Patient asked chaplain to pray for this need/concern and for her legs. Patient has a need/desire to be able to walk and take care of herself. Chaplain prayed with/for the patient and anointed her hands and head with oil, as requested.     08/07/17 2200  Clinical Encounter Type  Visited With Patient  Visit Type Follow-up;Spiritual support  Spiritual Encounters  Spiritual Needs Prayer

## 2017-08-08 DIAGNOSIS — F250 Schizoaffective disorder, bipolar type: Secondary | ICD-10-CM | POA: Diagnosis not present

## 2017-08-08 LAB — GLUCOSE, CAPILLARY
Glucose-Capillary: 130 mg/dL — ABNORMAL HIGH (ref 65–99)
Glucose-Capillary: 190 mg/dL — ABNORMAL HIGH (ref 65–99)
Glucose-Capillary: 121 mg/dL — ABNORMAL HIGH (ref 65–99)
Glucose-Capillary: 180 mg/dL — ABNORMAL HIGH (ref 65–99)

## 2017-08-08 NOTE — Progress Notes (Signed)
   08/08/17 1919  Clinical Encounter Type  Visited With Patient  Visit Type Spiritual support;Initial  Referral From Nurse  Consult/Referral To Chaplain  Spiritual Encounters  Spiritual Needs Prayer;Emotional  Stress Factors  Patient Stress Factors Family relationships;Lack of caregivers   CH responded to page from pt's nurse to come talk with patient.  Patient was emotional and distressed. Pt immediately tried to cry, a few tears were produced, but mostly the patient's face with distorted and eyes almost closed from distress.  Pt mentioned "They hurt me, and my son is siding with them." "They want to send me to [facility]." From reading in pt's chart, [place] assumed this is in reference to her home nursing facility.  Nurse was present in room and confirmed with pt and [place] that the decision to move her to [facility] had not been made.  [place] tried to comfort pt, holding her hand, offering emotional support and pastoral presence, assuring her she is safe here with loving nurses and people to care for her. Pt remained distressed, mostly non-verbal, inconsolable. CH had to leave to respond to another CD. [place] told pt I would be back asap.

## 2017-08-08 NOTE — Clinical Social Work Note (Signed)
CSW reviewed chart today and found that patient is no longer under IVC. According to chart, IVC ran out on 08/04/17 and has not been renewed. CSW faxed referrals to multiple geri psych facilities for placement for patient. CSW also followed up with the following facilities for potential placement: [facility], [facility], [facility], [facility], [facility],  [facility], [facility], and [facility] was told by staff at all of above facilities that patient does not meet criteria due to level of acuity. Facilities state that patient needs to be ambulatory. Per PT notes, patient is not ambulatory and requires a 2+ assist. Patient also not appropriate for [facility] at this time due to non ambulatory and no longer under IVC. CSW notified Psychiatry of above and Psychiatry states that they will see patient and follow up with guardian. CSW will continue to follow for discharge planning.   [place], [place]

## 2017-08-08 NOTE — Progress Notes (Signed)
   08/08/17 1445  Clinical Encounter Type  Visited With Patient  Visit Type Follow-up  Referral From Nurse  Consult/Referral To Chaplain  Spiritual Encounters  Spiritual Needs Prayer;Emotional   [place] received an OR to pray with PT. PT's mood seems to be similar as the last encounter this [place] had with PT. PT says that she is wonderful when [facility] ask how she felt but PT stated this while crying and appearing sad. [place] tried to engage with PT but she spoke very little. NT stated that this seemed to be PT's overall mood for the day. PT asked [place] to pray for her and [facility] prayed. [place] will follow up when requested.

## 2017-08-08 NOTE — Progress Notes (Signed)
Physical Therapy Treatment Patient Details Name: [name] [name] MRN: [id number] DOB: [birth year] Today's Date: December 03, 202019    History of Present Illness 72 y.o. female presenting to ED on 6/5 for paranoid episode, admitted to hospital 6/12 w/ acute on chronic renal disease stage 3, hypoxia secondary to pulmonary edema, and schizophrenia. PMH includes arthritis, bronchitis, CAD, CHF, chronic cough, COPD, GERD, and left ventricular hypertrophy.    PT Comments    Pt given vc's for bed mobility for technique and UE placement. Able to maintain sitting balance with no trunk support on EOB with vc's given to maintain forward trunk lean to discourage posterior lean. Pt able to reach within BOS while sitting with one hand on bed. Pt transferred w/ min guard/assist +2 and given vc's to lean forward when they were standing to discourage posterior lean. Min assist +2 given when ambulating, pt given assistance for AD management. Progress strength and functional mobility, focus on increasing ambulation distance to pt tolerance.  Follow Up Recommendations  SNF     Equipment Recommendations  Rolling walker with 5" wheels    Recommendations for Other Services OT consult     Precautions / Restrictions Precautions Precautions: Fall Restrictions Weight Bearing Restrictions: No    Mobility  Bed Mobility Overal bed mobility: Needs Assistance Bed Mobility: Supine to Sit     Supine to sit: Min guard;HOB elevated;Min assist     General bed mobility comments: Pt given vc's and tactile cues for UE placement when initiating supine to sit. Pt given min assist to support B LE as they were pushing through L elbow to bring body up. Able to scoot to EOB w/ vc's.   Transfers Overall transfer level: Needs assistance Equipment used: Rolling walker (2 wheeled) Transfers: Sit to/from Stand Sit to Stand: Min guard;Min assist;+2 safety/equipment         General transfer comment: Transfer trial x3:improved  automaticity with each successive trial  Ambulation/Gait Ambulation/Gait assistance: +2 physical assistance;Min assist Gait Distance (Feet): 3 Feet Assistive device: Rolling walker (2 wheeled) Gait Pattern/deviations: Step-to pattern;Decreased step length - right;Decreased step length - left     General Gait Details: Pt needed assistance to maintain balance when initially ambulating, tactile cues given for AD use.   Stairs             Wheelchair Mobility    Modified Rankin (Stroke Patients Only)       Balance Overall balance assessment: Needs assistance Sitting-balance support: Feet supported;Single extremity supported Sitting balance-Leahy Scale: Fair Sitting balance - Comments: Pt needs vc's to prevent posterior lean when on EOB, able to rock back and forth gently with no assistance and maintain balance, able to reach within BOS with one hand on EOB, other hand reaching Postural control: Posterior lean Standing balance support: Bilateral upper extremity supported Standing balance-Leahy Scale: Poor Standing balance comment: Pt given vc's to lean forward when standing b/c of tendency to lean posteriorly, able to march 2 steps                            Cognition Arousal/Alertness: Awake/alert Behavior During Therapy: WFL for tasks assessed/performed Overall Cognitive Status: Within Functional Limits for tasks assessed                                        Exercises      General Comments  Pertinent Vitals/Pain Pain Assessment: No/denies pain    Home Living                      Prior Function            PT Goals (current goals can now be found in the care plan section)      Frequency    Min 2X/week      PT Plan Current plan remains appropriate    Co-evaluation              AM-PAC PT "6 Clicks" Daily Activity  Outcome Measure  Difficulty turning over in bed (including adjusting bedclothes,  sheets and blankets)?: Unable Difficulty moving from lying on back to sitting on the side of the bed? : Unable Difficulty sitting down on and standing up from a chair with arms (e.g., wheelchair, bedside commode, etc,.)?: Unable Help needed moving to and from a bed to chair (including a wheelchair)?: A Lot Help needed walking in hospital room?: A Lot Help needed climbing 3-5 steps with a railing? : Total 6 Click Score: 8    End of Session Equipment Utilized During Treatment: Gait belt Activity Tolerance: Patient limited by fatigue Patient left: in chair;with nursing/sitter in room Nurse Communication: Mobility status;Precautions PT Visit Diagnosis: Muscle weakness (generalized) (M62.81);History of falling (Z91.81);Difficulty in walking, not elsewhere classified (R26.2);Unsteadiness on feet (R26.81)     Time: 3419-3790 PT Time Calculation (min) (ACUTE ONLY): 34 min  Charges:                       G CodesChana Bode, SPT Aug 11, 2017, 1:48 PM

## 2017-08-08 NOTE — Progress Notes (Signed)
   08/08/17 1955  Clinical Encounter Type  Visited With Patient  Visit Type Follow-up;Spiritual support  Referral From Nurse  Consult/Referral To Chaplain  Spiritual Encounters  Spiritual Needs Ritual;Prayer  Stress Factors  Patient Stress Factors Family relationships    CH returned to pt after responding to CD. Pt was still distressed, pt asked for prayer.  Pt specifically wanted prayer for God not to send her to [facility], for her relationships with her family, blessings on her and her family. [place] prayed for the pt to feel safe and cared for, for her needs to be met, and for God to bless and have mercy on her and her family. Pt cried out after prayer, "Please God forgive my sins." CH comforted pt, telling pt that God forgives all sins and loves her more than anyone and that she will never be alone, God is always present and with her. [place] prayed for God's presence and comfort. CH told pt she would continue to pray for pt.

## 2017-08-08 NOTE — Plan of Care (Signed)
  Problem: Education: Goal: Knowledge of General Education information will improve Outcome: Progressing   Problem: Health Behavior/Discharge Planning: Goal: Ability to manage health-related needs will improve Outcome: Progressing   Problem: Clinical Measurements: Goal: Ability to maintain clinical measurements within normal limits will improve Outcome: Progressing   Problem: Nutrition: Goal: Adequate nutrition will be maintained Outcome: Progressing   Problem: Coping: Goal: Level of anxiety will decrease Outcome: Progressing   Problem: Elimination: Goal: Will not experience complications related to bowel motility Outcome: Progressing Goal: Will not experience complications related to urinary retention Outcome: Progressing   Problem: Pain Managment: Goal: General experience of comfort will improve Outcome: Progressing Note:  Tylenol given x1 for c/o headache. Improvement noted   Problem: Safety: Goal: Ability to remain free from injury will improve Outcome: Progressing Note:  Pt remains IVC. Sitter at bedside   Problem: Skin Integrity: Goal: Risk for impaired skin integrity will decrease Outcome: Progressing

## 2017-08-08 NOTE — Consult Note (Signed)
[facility] Face-to-Face Psychiatry Consult   Reason for Consult: Consult follow-up 72 year old woman with chronic severe mental health problems now on the medical unit. Referring Physician: Verdell Carmine Patient Identification: [name] [name] MRN:  [id number] Principal Diagnosis: Schizoaffective disorder, bipolar type [facility]) Diagnosis:   Patient Active Problem List   Diagnosis Date Noted  . Acute renal failure (ARF) ([place]) [N17.9] 08/05/2017  . ARF (acute renal failure) ([place]) [N17.9] 08/03/2017  . UTI (urinary tract infection) [N39.0] 01/16/2016  . Altered mental status [R41.82] 01/16/2016  . Metabolic encephalopathy [I14.43]   . Acute on chronic respiratory failure with hypoxia and hypercapnia (HCC) [X54.00, J96.22] 10/13/2015  . Involuntary commitment [Z04.6] 09/02/2015  . Schizoaffective disorder, bipolar type ([place]) [F25.0]   . Urinary incontinence [R32] 10/30/2014  . GERD (gastroesophageal reflux disease) [K21.9] 10/30/2014  . OSA (obstructive sleep apnea) [G47.33] 10/30/2014  . Osteoarthrosis, unspecified whether generalized or localized, involving lower leg [M17.10] 10/30/2014  . COPD (chronic obstructive pulmonary disease) ([place]) [J44.9] 10/30/2014  . Chronic diastolic CHF (congestive heart failure) ([place]) [I50.32] 05/15/2014  . Morbid obesity ([place]) [E66.01]   . History of colon polyps [Z86.010] 03/09/2013  . Renal mass [N28.89] 06/26/2011  . 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 Hypertension [I10] 06/24/2011  . Diabetes mellitus ([place]) [E11.9] 06/24/2011    Total Time spent with patient: 45 minutes  Subjective:   [name] [name] is a 72 y.o. female patient admitted with "I cannot go back there".  HPI: Patient seen chart reviewed.  Also spoke with her son who is also her guardian.  Also spoke with social work.  72 year old woman with a history of schizoaffective disorder.  Patient had been in the emergency room waiting for transfer to geriatric psychiatry when she was admitted to  the medical service.  Acute medical issues are stabilizing.  On interview today the patient is labile.  At first she cheerful he greets me but then immediately breaks down in tears.  Has a hard time articulating anything that is on her mind but gets very agitated when discussing the place where she had been living.  Insist that they were treating her very badly there.  I spoke with her son who has been staying in touch with her and says that she continues to be paranoid psychotic and depressed when he speaks with her.  He is very concerned that she will be unmanageable at an outpatient treatment facility currently because of her psychosis.  Medical history: Overweight congestive heart failure multiple medical issues  Social history: Her son [name] is her guardian.  He is familiar with her mental health problems having dealt with that his whole life.  Substance abuse history: None  Past Psychiatric History: Long history of mental health problems multiple hospitalizations including lengthy hospitalizations at [facility] [place] in the past.  Patient does not have a history of suicide attempts but does have a history of some aggression in the past and a lot of psychosis and noncompliance.  Risk to Self: Suicidal Ideation: No Suicidal Intent: No Is patient at risk for suicide?: No Suicidal Plan?: No Access to Means: No What has been your use of drugs/alcohol within the last 12 months?: Pt denies How many times?: 0 Other Self Harm Risks: n/a Triggers for Past Attempts: None known Intentional Self Injurious Behavior: None Risk to Others: Homicidal Ideation: No Thoughts of Harm to Others: No Current Homicidal Intent: No Current Homicidal Plan: No Access to Homicidal Means: No Identified Victim: n/a History of harm to  others?: No Assessment of Violence: None Noted Violent Behavior Description: nonr reported Does patient have access to weapons?: No Criminal Charges Pending?: No Does  patient have a court date: No Prior Inpatient Therapy: Prior Inpatient Therapy: Yes Prior Therapy Dates: Several Prior Therapy Facilty/Provider(s): [facility] Reason for Treatment: Schizophrenia Prior Outpatient Therapy: Prior Outpatient Therapy: No Does patient have an ACCT team?: No Does patient have Intensive In-House Services?  : No Does patient have [facility] services? : No Does patient have P4CC services?: No  Past Medical History:  Past Medical History:  Diagnosis Date  . Anxiety   . Arthritis   . Bronchitis   . Bursitis   . CAD (coronary artery disease)   . CHF (congestive heart failure) ([place])   . Chronic cough   . Chronic kidney disease    shadow on x-ray  . COPD (chronic obstructive pulmonary disease) ([place])   . Depression   . DM2 (diabetes mellitus, type 2) ([place])   . Environmental allergies   . GERD (gastroesophageal reflux disease)   . Hypercholesteremia   . Hypertension   . Left ventricular outflow tract obstruction    a. echo 03/2014: EF 60-65%, hypernamic LV systolic fxn, mod LVH w/ LVOT gradient estimated at 68 mm Hg w/ valsalva, very small LV internal cavity size, mildly increased LV posterior wall thickness, mild Ao valve scl w/o stenosis, diastolic dysfunction, normal RVSP  . Lower extremity edema   . LVH (left ventricular hypertrophy)    a. echo suggests long standing uncontrolled htn. she will not do well when dehydrated, LV cavity obliteration  . Muscle weakness   . Obesity   . On supplemental oxygen therapy    AS NEEDED  . OSA (obstructive sleep apnea)    does not use machine  . Osteoarthritis   . Schizophrenia ([place])   . Tremors of nervous system   . Wheezing     Past Surgical History:  Procedure Laterality Date  . CATARACT EXTRACTION W/PHACO Left 09/10/2014   Procedure: CATARACT EXTRACTION PHACO AND INTRAOCULAR LENS PLACEMENT (IOC);  Surgeon: Birder Robson, MD;  Location: [facility] ORS;  Service: Ophthalmology;  Laterality: Left;  [place]: 00:44 AP%:  22.8 CDE: 10.24 Fluid lot #[id number]  . CATARACT EXTRACTION W/PHACO Right 10/15/2014   Procedure: CATARACT EXTRACTION PHACO AND INTRAOCULAR LENS PLACEMENT (IOC);  Surgeon: Birder Robson, MD;  Location: [facility] ORS;  Service: Ophthalmology;  Laterality: Right;  [place]: 00:52 AP:40.1 CDE:11.83 LOT PACK #[id number]  . CHOLECYSTECTOMY    . COLONOSCOPY  04/2011   [facility] per patient incomplete  . EYE SURGERY    . JOINT REPLACEMENT     TKR  . KNEE RECONSTRUCTION, MEDIAL PATELLAR FEMORAL LIGAMENT    . RENAL BIOPSY    . TONSILLECTOMY    . TONSILLECTOMY     Family History:  Family History  Problem Relation Age of Onset  . Heart attack Mother   . Colon cancer Neg Hx   . Liver disease Neg Hx    Family Psychiatric  History: Unknown Social History:  Social History   Substance and Sexual Activity  Alcohol Use No   Comment: occ.     Social History   Substance and Sexual Activity  Drug Use No    Social History   Socioeconomic History  . Marital status: Widowed    Spouse name: Not on file  . Number of children: 2  . Years of education: Not on file  . Highest education level: Not on file  Occupational History  .  Not on file  Social Needs  . Financial resource strain: Not on file  . Food insecurity:    Worry: Not on file    Inability: Not on file  . Transportation needs:    Medical: Not on file    Non-medical: Not on file  Tobacco Use  . Smoking status: Former Smoker    Packs/day: 3.00    Years: 40.00    Pack years: 120.00  . Smokeless tobacco: Never Used  . Tobacco comment: quit in 2009  Substance and Sexual Activity  . Alcohol use: No    Comment: occ.  . Drug use: No  . Sexual activity: Not Currently  Lifestyle  . Physical activity:    Days per week: Not on file    Minutes per session: Not on file  . Stress: Not on file  Relationships  . Social connections:    Talks on phone: Not on file    Gets together: Not on file    Attends religious service: Not on file    Active  member of club or organization: Not on file    Attends meetings of clubs or organizations: Not on file    Relationship status: Not on file  Other Topics Concern  . Not on file  Social History Narrative  . Not on file   Additional Social History:    Allergies:   Allergies  Allergen Reactions  . Haldol [Haloperidol Decanoate] Swelling and Other (See Comments)    Reaction:  Swelling of tongue and blurred vision    . Metformin Diarrhea  . Prednisone Other (See Comments)    Unknown reaction  . Raspberry Swelling and Other (See Comments)    Reaction:  Swelling of lips     Labs:  Results for orders placed or performed during the hospital encounter of 07/27/17 (from the past 48 hour(s))  Glucose, capillary     Status: Abnormal   Collection Time: 08/06/17  8:40 PM  Result Value Ref Range   Glucose-Capillary 229 (H) 65 - 99 mg/dL  Basic metabolic panel     Status: Abnormal   Collection Time: 08/07/17  5:26 AM  Result Value Ref Range   Sodium 139 135 - 145 mmol/L   Potassium 4.2 3.5 - 5.1 mmol/L   Chloride 101 101 - 111 mmol/L   CO2 31 22 - 32 mmol/L   Glucose, Bld 98 65 - 99 mg/dL   BUN 19 6 - 20 mg/dL   Creatinine, Ser 1.12 (H) 0.44 - 1.00 mg/dL   Calcium 8.8 (L) 8.9 - 10.3 mg/dL   GFR calc non Af Amer 48 (L) >60 mL/min   GFR calc Af Amer 55 (L) >60 mL/min    Comment: (NOTE) The eGFR has been calculated using the CKD EPI equation. This calculation has not been validated in all clinical situations. eGFR's persistently <60 mL/min signify possible Chronic Kidney Disease.    Anion gap 7 5 - 15    Comment: Performed at [facility], [address] [place], [place] [address]  Glucose, capillary     Status: Abnormal   Collection Time: 08/07/17  7:30 AM  Result Value Ref Range   Glucose-Capillary 117 (H) 65 - 99 mg/dL  Glucose, capillary     Status: Abnormal   Collection Time: 08/07/17 11:32 AM  Result Value Ref Range   Glucose-Capillary 209 (H) 65 - 99 mg/dL   Glucose, capillary     Status: Abnormal   Collection Time: 08/07/17  4:30 PM  Result Value Ref Range   Glucose-Capillary 166 (H) 65 - 99 mg/dL  Glucose, capillary     Status: Abnormal   Collection Time: 08/07/17  9:21 PM  Result Value Ref Range   Glucose-Capillary 190 (H) 65 - 99 mg/dL  Glucose, capillary     Status: Abnormal   Collection Time: 08/08/17  7:46 AM  Result Value Ref Range   Glucose-Capillary 130 (H) 65 - 99 mg/dL  Glucose, capillary     Status: Abnormal   Collection Time: 08/08/17 11:42 AM  Result Value Ref Range   Glucose-Capillary 190 (H) 65 - 99 mg/dL  Glucose, capillary     Status: Abnormal   Collection Time: 08/08/17  4:48 PM  Result Value Ref Range   Glucose-Capillary 121 (H) 65 - 99 mg/dL    Current Facility-Administered Medications  Medication Dose Route Frequency Provider Last Rate Last Dose  . acetaminophen (TYLENOL) tablet 650 mg  650 mg Oral Q6H PRN Gladstone Lighter, MD   650 mg at 08/08/17 1751   Or  . acetaminophen (TYLENOL) suppository 650 mg  650 mg Rectal Q6H PRN Gladstone Lighter, MD      . albuterol (PROVENTIL) (2.5 MG/3ML) 0.083% nebulizer solution 2.5 mg  2.5 mg Inhalation Q6H PRN Gregor Hams, MD   2.5 mg at 08/02/17 2241  . aspirin EC tablet 81 mg  81 mg Oral Daily Gregor Hams, MD   81 mg at 08/08/17 0801  . atorvastatin (LIPITOR) tablet 20 mg  20 mg Oral QHS Gregor Hams, MD   20 mg at 08/07/17 2102  . benztropine (COGENTIN) tablet 0.5 mg  0.5 mg Oral BID Gregor Hams, MD   0.5 mg at 08/08/17 0800  . carvedilol (COREG) tablet 12.5 mg  12.5 mg Oral BID Gregor Hams, MD   12.5 mg at 08/08/17 6962  . cholecalciferol (VITAMIN D) tablet 1,000 Units  1,000 Units Oral Daily Gregor Hams, MD   1,000 Units at 08/08/17 0800  . divalproex (DEPAKOTE) DR tablet 500 mg  500 mg Oral TID Gladstone Lighter, MD   500 mg at 08/08/17 1505  . docusate sodium (COLACE) capsule 200 mg  200 mg Oral Daily Gregor Hams, MD   200  mg at 08/08/17 0801  . famotidine (PEPCID) tablet 20 mg  20 mg Oral BID Gladstone Lighter, MD   20 mg at 08/08/17 0800  . feeding supplement (PRO-STAT SUGAR FREE 64) liquid 30 mL  30 mL Oral BID Gregor Hams, MD   30 mL at 08/08/17 1006  . fluPHENAZine (PROLIXIN) tablet 5 mg  5 mg Oral TID Arta Silence, MD   5 mg at 08/08/17 1505  . fluPHENAZine decanoate (PROLIXIN) injection 50 mg  50 mg Intramuscular Q14 Days Pucilowska, Jolanta B, MD   50 mg at 07/28/17 1615  . furosemide (LASIX) tablet 20 mg  20 mg Oral Daily [name], MD   20 mg at 08/08/17 0800  . glipiZIDE (GLUCOTROL) tablet 10 mg  10 mg Oral Daily Gregor Hams, MD   10 mg at 08/08/17 0800  . guaiFENesin (ROBITUSSIN) 100 MG/5ML solution 300 mg  15 mL Oral Q6H PRN Gregor Hams, MD   100 mg at 07/30/17 0255  . heparin injection 5,000 Units  5,000 Units Subcutaneous Q8H Gladstone Lighter, MD   5,000 Units at 08/08/17 1505  . insulin aspart (novoLOG) injection 0-15 Units  0-15 Units Subcutaneous TID WC Gregor Hams, MD   2  Units at 08/08/17 1712  . insulin aspart (novoLOG) injection 0-5 Units  0-5 Units Subcutaneous QHS Gregor Hams, MD   2 Units at 08/06/17 2158  . insulin glargine (LANTUS) injection 11 Units  11 Units Subcutaneous QHS Gregor Hams, MD   11 Units at 08/07/17 2152  . levothyroxine (SYNTHROID, LEVOTHROID) tablet 75 mcg  75 mcg Oral QAC breakfast Gladstone Lighter, MD   75 mcg at 08/08/17 0800  . lisinopril (PRINIVIL,ZESTRIL) tablet 10 mg  10 mg Oral QHS [name], MD   10 mg at 08/07/17 2102  . loratadine (CLARITIN) tablet 10 mg  10 mg Oral Daily Gregor Hams, MD   10 mg at 08/08/17 0801  . magnesium hydroxide (MILK OF MAGNESIA) suspension 30 mL  30 mL Oral Daily PRN Gregor Hams, MD      . mometasone-formoterol [facility]) 200-5 MCG/ACT inhaler 2 puff  2 puff Inhalation BID Gladstone Lighter, MD   2 puff at 08/08/17 0807  . multivitamin with minerals tablet 1 tablet   1 tablet Oral Daily Gregor Hams, MD   1 tablet at 08/08/17 0800  . nitroGLYCERIN (NITROSTAT) SL tablet 0.4 mg  0.4 mg Sublingual Q5 min PRN Gregor Hams, MD      . ondansetron [facility]) tablet 4 mg  4 mg Oral Q6H PRN Gladstone Lighter, MD       Or  . ondansetron [facility]) injection 4 mg  4 mg Intravenous Q6H PRN Gladstone Lighter, MD   4 mg at 08/03/17 1822  . polyethylene glycol (MIRALAX / GLYCOLAX) packet 17 g  17 g Oral Daily PRN Gladstone Lighter, MD      . tiotropium [facility]) inhalation capsule 18 mcg  18 mcg Inhalation Daily Gregor Hams, MD   18 mcg at 08/08/17 0813    Musculoskeletal: Strength & Muscle Tone: decreased Gait & Station: unsteady Patient leans: N/A  Psychiatric Specialty Exam: Physical Exam  Nursing note and vitals reviewed. Constitutional: She appears well-developed and well-nourished. She appears distressed.  HENT:  Head: Normocephalic and atraumatic.  Eyes: Pupils are equal, round, and reactive to light. Conjunctivae are normal.  Neck: Normal range of motion.  Cardiovascular: Normal heart sounds.  Respiratory: Effort normal.  GI: Soft.  Musculoskeletal: Normal range of motion.  Neurological: She is alert.  Week and unable to ambulate regularly  Skin: Skin is warm and dry.  Psychiatric: Her affect is labile and inappropriate. Her speech is tangential. She is agitated. She is not aggressive. Thought content is paranoid and delusional. She expresses inappropriate judgment. She expresses no homicidal and no suicidal ideation. She exhibits abnormal recent memory.    Review of Systems  Constitutional: Positive for malaise/fatigue.  HENT: Negative.   Eyes: Negative.   Respiratory: Negative.   Cardiovascular: Negative.   Gastrointestinal: Negative.   Musculoskeletal: Negative.   Skin: Negative.   Neurological: Positive for focal weakness.  Psychiatric/Behavioral: Positive for depression and memory loss. Negative for hallucinations, substance  abuse and suicidal ideas. The patient is nervous/anxious. The patient does not have insomnia.     Blood pressure (!) 151/67, pulse 81, temperature 98.2 F (36.8 C), temperature source Oral, resp. rate 20, height '5\' 6"'  (1.676 m), weight 123.2 kg (271 lb 11.2 oz), SpO2 93 %.Body mass index is 43.85 kg/m.  General Appearance: Disheveled  Eye Contact:  Fair  Speech:  Slow  Volume:  Decreased  Mood:  Depressed  Affect:  Depressed and Tearful  Thought Process:  Disorganized  Orientation:  Full (Time, Place, and Person)  Thought Content:  Illogical, Paranoid Ideation, Rumination and Tangential  Suicidal Thoughts:  No  Homicidal Thoughts:  No  Memory:  Immediate;   Fair Recent;   Poor Remote;   Poor  Judgement:  Impaired  Insight:  Lacking  Psychomotor Activity:  Decreased  Concentration:  Concentration: Poor  Recall:  Poor  Fund of Knowledge:  Poor  Language:  Poor  Akathisia:  No  Handed:  Right  AIMS (if indicated):     Assets:  Desire for Improvement Social Support  ADL's:  Impaired  Cognition:  Impaired,  Mild  Sleep:        Treatment Plan Summary: Daily contact with patient to assess and evaluate symptoms and progress in treatment, Medication management and Plan Patient was schizoaffective disorder.  She is psychotic with paranoid beliefs and reactive paranoid behavior.  Unable to take care of herself.  Very likely to become agitated and noncompliant and potentially dangerous in an outpatient setting.  I agree with her son's assessment that psychiatric hospitalization would be the safest thing for her.  She is not acutely suicidal.  Nevertheless, it would be appropriate to reinstate involuntary commitment given her psychosis and inability to care for herself or behave appropriately.  I believe I will restart the IV C.  Continue current psychiatric medication.  Continue trying to refer to geriatric psychiatry or inpatient long-term hospitalization.  Disposition: Recommend  psychiatric Inpatient admission when medically cleared. Supportive therapy provided about ongoing stressors.  Alethia Berthold, MD 2020-06-2917 6:01 PM

## 2017-08-08 NOTE — Progress Notes (Signed)
[facility] [place] NAME: Chantee Cerino    MR#:  [id number]  DATE OF BIRTH:  01/24/1946  SUBJECTIVE:   No acute events overnight, patient worked with physical therapy today.  Awaiting Geri psych placement.  REVIEW OF SYSTEMS:    Review of Systems  Constitutional: Negative for chills and fever.  HENT: Negative for congestion and tinnitus.   Eyes: Negative for blurred vision and double vision.  Respiratory: Negative for cough, shortness of breath and wheezing.   Cardiovascular: Negative for chest pain, orthopnea and PND.  Gastrointestinal: Negative for abdominal pain, diarrhea, nausea and vomiting.  Genitourinary: Negative for dysuria and hematuria.  Neurological: Negative for dizziness, sensory change and focal weakness.  All other systems reviewed and are negative.   Nutrition: Heart Healthy Tolerating Diet: Yes Tolerating PT: eval noted.    DRUG ALLERGIES:   Allergies  Allergen Reactions  . Haldol [Haloperidol Decanoate] Swelling and Other (See Comments)    Reaction:  Swelling of tongue and blurred vision    . Metformin Diarrhea  . Prednisone Other (See Comments)    Unknown reaction  . Raspberry Swelling and Other (See Comments)    Reaction:  Swelling of lips     VITALS:  Blood pressure 125/77, pulse (!) 52, temperature 98.2 F (36.8 C), temperature source Oral, resp. rate 17, height 5\' 6"  (1.676 m), weight 123.2 kg (271 lb 11.2 oz), SpO2 96 %.  PHYSICAL EXAMINATION:   Physical Exam  GENERAL:  72 y.o.-year-old obese patient lying in bed alert but in NAD EYES: Pupils equal, round, reactive to light and accommodation. No scleral icterus. Extraocular muscles intact.  HEENT: Head atraumatic, normocephalic. Oropharynx and nasopharynx clear.  NECK:  Supple, no jugular venous distention. No thyroid enlargement, no tenderness.  LUNGS: Normal breath sounds bilaterally, no wheezing, rales, rhonchi. No use of accessory muscles of  respiration.  CARDIOVASCULAR: S1, S2 normal. No murmurs, rubs, or gallops.  ABDOMEN: Soft, nontender, nondistended. Bowel sounds present. No organomegaly or mass.  EXTREMITIES: No cyanosis, clubbing, + 1 edema b/l.     NEUROLOGIC: Cranial nerves II through XII are intact. No focal Motor or sensory deficits b/l.  Globally weak.  PSYCHIATRIC: The patient is alert and oriented x 1.   SKIN: No obvious rash, lesion, or ulcer.    LABORATORY PANEL:   CBC Recent Labs  Lab 08/04/17 0427  WBC 9.1  HGB 12.0  HCT 37.5  PLT 230   ------------------------------------------------------------------------------------------------------------------  Chemistries  Recent Labs  Lab 08/03/17 1205  08/07/17 0526  NA 140   < > 139  K 4.8   < > 4.2  CL 101   < > 101  CO2 30   < > 31  GLUCOSE 181*   < > 98  BUN 32*   < > 19  CREATININE 2.07*   < > 1.12*  CALCIUM 8.9   < > 8.8*  AST 28  --   --   ALT 22  --   --   ALKPHOS 65  --   --   BILITOT 0.5  --   --    < > = values in this interval not displayed.   ------------------------------------------------------------------------------------------------------------------  Cardiac Enzymes Recent Labs  Lab 08/03/17 1205  TROPONINI <0.03   ------------------------------------------------------------------------------------------------------------------  RADIOLOGY:  No results found.   ASSESSMENT AND PLAN:   [place]46 y.o.femalewith a known history of schizophrenia with paranoid behavior, hypertension, diabetes, hypertrophic cardiomyopathy with congestive heart failure, depression,  CAD [facility] and has been in the emergency room since 07/27/2017 for paranoid behavior.  1.Acute renal failure on CKD stage III.  - Cr. Improved and back to baseline.   - cont. Lisinopril.   2. Hypoxia - resolved presently.  Thought to be due to Pulm. Edema but pt. Is not volume overloaded presently.  - cont. Lasix.  Incentive Spirometry.    3. Schizophrenia with paranoid behavior- on involuntary commitment.  - appreciate Psych input no need for inpatient Psych presently.  - cont. Current Psych meds with Proloxin, depakote, Cogentin.    4. Diabetes mellitus-continue Lantus, SSI and glipizide.  - BS stable.   5. Hypothyroidism-continue Synthroid  6. Hypertension - cont. Lisinopril.    7. Hyperlipidemia - cont. Atorvastatin.   Awaiting GERI-Psych placement.    All the records are reviewed and case discussed with Care Management/Social Worker. Management plans discussed with the patient, family and they are in agreement.  CODE STATUS: Full code  DVT Prophylaxis: Hep SQ  TOTAL TIME TAKING CARE OF THIS PATIENT: 25 minutes.   POSSIBLE D/C unclear as awaiting geri-psych placement.    [name] M.D on 06/02/202019 at 1:43 PM  Between 7am to 6pm - Pager - [phone]  After 6pm go to [email or web address] - Proofreader  [facility]  Office  [phone]  CC: Primary care physician; Patient, No Pcp Per

## 2017-08-09 DIAGNOSIS — F250 Schizoaffective disorder, bipolar type: Secondary | ICD-10-CM | POA: Diagnosis not present

## 2017-08-09 LAB — GLUCOSE, CAPILLARY
GLUCOSE-CAPILLARY: 142 mg/dL — AB (ref 65–99)
GLUCOSE-CAPILLARY: 151 mg/dL — AB (ref 65–99)
GLUCOSE-CAPILLARY: 130 mg/dL — AB (ref 65–99)
Glucose-Capillary: 204 mg/dL — ABNORMAL HIGH (ref 65–99)

## 2017-08-09 LAB — CBC
HCT: 40.9 % (ref 35.0–47.0)
Hemoglobin: 13.4 g/dL (ref 12.0–16.0)
MCH: 29.6 pg (ref 26.0–34.0)
MCHC: 32.7 g/dL (ref 32.0–36.0)
MCV: 90.6 fL (ref 80.0–100.0)
PLATELETS: 263 10*3/uL (ref 150–440)
RBC: 4.51 MIL/uL (ref 3.80–5.20)
RDW: 14.7 % — ABNORMAL HIGH (ref 11.5–14.5)
WBC: 7.8 10*3/uL (ref 3.6–11.0)

## 2017-08-09 NOTE — Clinical Social Work Note (Signed)
CSW spoke with patient's guardian [name] [name] today regarding discharge disposition. Guardian states that he spoke with the Psychiatrist and that he is expecting patient to transfer to [place] explained to guardian that patient is requiring a lot of assistance and is currently not ambulating. CSW also explained that referrals were sent yesterday for geri psych but that all of those facilities could not accept patient due to level of care. CSW explained that we will try to get patient to [facility] but it was unlikely that this would happen. [name] states that he is sure that patient will be able to go. Son also faxed CSW a copy of guardianship paperwork. CSW again reminded son that patient may not qualify for [facility] and they usually have a waiting list. Son states he understands. CSW will continue to follow for discharge planning.   [place], [place]
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
## 2017-08-09 NOTE — Progress Notes (Signed)
   08/09/17 1600  Clinical Encounter Type  Visited With Patient not available  Visit Type Follow-up  Referral From Nurse  Consult/Referral To Chaplain  Chaplain received OR for PT.Chaplain went to PT room and knock on door, nurse open and said they were doing PT. Care. Chaplain said ok, she will return later.

## 2017-08-09 NOTE — Plan of Care (Signed)
  Problem: Education: Goal: Knowledge of General Education information will improve Outcome: Progressing   Problem: Health Behavior/Discharge Planning: Goal: Ability to manage health-related needs will improve Outcome: Progressing   Problem: Coping: Goal: Level of anxiety will decrease Outcome: Progressing   Problem: Elimination: Goal: Will not experience complications related to urinary retention Outcome: Progressing   Problem: Safety: Goal: Ability to remain free from injury will improve Outcome: Progressing   Problem: Skin Integrity: Goal: Risk for impaired skin integrity will decrease Outcome: Progressing

## 2017-08-09 NOTE — Progress Notes (Signed)
[place] at [place] NAME: [name] [name]    MR#:  [id number]  DATE OF BIRTH:  06-Mar-1945  SUBJECTIVE:   No acute events overnight, tearful at times.  No other complaints.  REVIEW OF SYSTEMS:    Review of Systems  Constitutional: Negative for chills and fever.  HENT: Negative for congestion and tinnitus.   Eyes: Negative for blurred vision and double vision.  Respiratory: Negative for cough, shortness of breath and wheezing.   Cardiovascular: Negative for chest pain, orthopnea and PND.  Gastrointestinal: Negative for abdominal pain, diarrhea, nausea and vomiting.  Genitourinary: Negative for dysuria and hematuria.  Neurological: Negative for dizziness, sensory change and focal weakness.  All other systems reviewed and are negative.   Nutrition: Heart Healthy Tolerating Diet: Yes Tolerating PT: eval noted.    DRUG ALLERGIES:   Allergies  Allergen Reactions  . Haldol [Haloperidol Decanoate] Swelling and Other (See Comments)    Reaction:  Swelling of tongue and blurred vision    . Metformin Diarrhea  . Prednisone Other (See Comments)    Unknown reaction  . Raspberry Swelling and Other (See Comments)    Reaction:  Swelling of lips     VITALS:  Blood pressure (!) 150/76, pulse 68, temperature 98.5 F (36.9 C), temperature source Oral, resp. rate 18, height 5\' 6"  (1.676 m), weight 123.2 kg (271 lb 11.2 oz), SpO2 94 %.  PHYSICAL EXAMINATION:   Physical Exam  GENERAL:  72 y.o.-year-old obese patient lying in bed alert but in NAD EYES: Pupils equal, round, reactive to light and accommodation. No scleral icterus. Extraocular muscles intact.  HEENT: Head atraumatic, normocephalic. Oropharynx and nasopharynx clear.  NECK:  Supple, no jugular venous distention. No thyroid enlargement, no tenderness.  LUNGS: Normal breath sounds bilaterally, no wheezing, rales, rhonchi. No use of accessory muscles of respiration.  CARDIOVASCULAR: S1, S2  normal. No murmurs, rubs, or gallops.  ABDOMEN: Soft, nontender, nondistended. Bowel sounds present. No organomegaly or mass.  EXTREMITIES: No cyanosis, clubbing, + 1 edema b/l.     NEUROLOGIC: Cranial nerves II through XII are intact. No focal Motor or sensory deficits b/l.  Globally weak.  PSYCHIATRIC: The patient is alert and oriented x 1.   SKIN: No obvious rash, lesion, or ulcer.    LABORATORY PANEL:   CBC Recent Labs  Lab 08/09/17 0741  WBC 7.8  HGB 13.4  HCT 40.9  PLT 263   ------------------------------------------------------------------------------------------------------------------  Chemistries  Recent Labs  Lab 08/03/17 1205  08/07/17 0526  NA 140   < > 139  K 4.8   < > 4.2  CL 101   < > 101  CO2 30   < > 31  GLUCOSE 181*   < > 98  BUN 32*   < > 19  CREATININE 2.07*   < > 1.12*  CALCIUM 8.9   < > 8.8*  AST 28  --   --   ALT 22  --   --   ALKPHOS 65  --   --   BILITOT 0.5  --   --    < > = values in this interval not displayed.   ------------------------------------------------------------------------------------------------------------------  Cardiac Enzymes Recent Labs  Lab 08/03/17 1205  TROPONINI <0.03   ------------------------------------------------------------------------------------------------------------------  RADIOLOGY:  No results found.   ASSESSMENT AND PLAN:   [name] a72 y.o.femalewith a known history of schizophrenia with paranoid behavior, hypertension, diabetes, hypertrophic cardiomyopathy with congestive heart failure, depression, CAD [facility] and has been in the emergency room since 07/27/2017 for paranoid behavior.  1.Acute renal failure on CKD stage III.  - Cr. Improved and back to baseline.   - cont. Lisinopril.   2. Hypoxia  - was due to mild Pulm. Edema but now resolved.   - cont. Lasix. Incentive Spirometry.    3. Schizophrenia with paranoid behavior- pt. Remains on  involuntary commitment.  - appreciate Psych input no need for inpatient Psych presently.  - cont. Current Psych meds with Proloxin, depakote, Cogentin.   - await Geri-Psych placement.   4. Diabetes mellitus-continue Lantus, SSI and glipizide.  - BS stable.   5. Hypothyroidism-continue Synthroid  6. Hypertension - cont. Lisinopril.    7. Hyperlipidemia - cont. Atorvastatin.   Awaiting GERI-Psych placement.    All the records are reviewed and case discussed with Care Management/Social Worker. Management plans discussed with the patient, family and they are in agreement.  CODE STATUS: Full code  DVT Prophylaxis: Hep SQ  TOTAL TIME TAKING CARE OF THIS PATIENT: 25 minutes.   POSSIBLE D/C unclear as awaiting geri-psych placement.    [name] M.D on 08/09/2017 at 1:45 PM  Between 7am to 6pm - Pager - [phone]  After 6pm go to [email or web address] - Proofreader  [facility]  Office  [phone]  CC: Primary care physician; Patient, No Pcp Per

## 2017-08-09 NOTE — Progress Notes (Signed)
   08/09/17 2000  Clinical Encounter Type  Visited With Patient  Visit Type Follow-up  Referral From Nurse  Consult/Referral To Chaplain  Chaplain received OR for PT.Chaplain knock on Pt door and she was asleep, however, this was Chaplain second time coming so she knew PT wanted prayer, so, Chaplain called Pt name. Pt woke up immediately and started to cry. Chaplain said to PT what is wrong you were sleeping peaceful, why are you crying. PT look at [facility] and said she was glad to see her and stop crying as well. Chaplain ask Pt how was her day and she said good her son [name]) came to see her and feed her breakfast. Pt said she hates that her son [name] does not wear a coat . Chaplain informed Pt it was not cold enough for a coat, so she did not need to worry about him without a coat. It was to hot anyway. Pt said doctor came in to see her today and he said he was going to take her off some medicine. Pt said the doctors were trying to find her some where to live so she does not have to go back to that mean place. Pt said there are mean people in the world, they do not have a kind heart in them.  Chaplain ask Pt what did she want her to pray for today, Pt said, her, family and grand kids and for God to take them home. Chaplain said ok and proceed to pray for Pt. Chaplain told Pt to get some rest she notice her dosing off during prayer. PT told Chaplain to get rest too. Chaplain said ok we will see you tomorrow.

## 2017-08-10 DIAGNOSIS — F250 Schizoaffective disorder, bipolar type: Secondary | ICD-10-CM | POA: Diagnosis not present

## 2017-08-10 LAB — GLUCOSE, CAPILLARY
GLUCOSE-CAPILLARY: 144 mg/dL — AB (ref 65–99)
Glucose-Capillary: 111 mg/dL — ABNORMAL HIGH (ref 65–99)
Glucose-Capillary: 175 mg/dL — ABNORMAL HIGH (ref 65–99)
Glucose-Capillary: 201 mg/dL — ABNORMAL HIGH (ref 65–99)

## 2017-08-10 NOTE — Consult Note (Signed)
[facility] Consult   Reason for Consult: Follow-up 72 year old woman with schizoaffective disorder. Referring Physician: Verdell Carmine Patient Identification: [name] [name] MRN:  [id number] Principal Diagnosis: Schizoaffective disorder, bipolar type [facility]) Diagnosis:   Patient Active Problem List   Diagnosis Date Noted  . Acute renal failure (ARF) ([place]) [N17.9] 08/05/2017  . ARF (acute renal failure) ([place]) [N17.9] 08/03/2017  . UTI (urinary tract infection) [N39.0] 01/16/2016  . Altered mental status [R41.82] 01/16/2016  . Metabolic encephalopathy [S01.09]   . Acute on chronic respiratory failure with hypoxia and hypercapnia (HCC) [N23.55, J96.22] 10/13/2015  . Involuntary commitment [Z04.6] 09/02/2015  . Schizoaffective disorder, bipolar type ([place]) [F25.0]   . Urinary incontinence [R32] 10/30/2014  . GERD (gastroesophageal reflux disease) [K21.9] 10/30/2014  . OSA (obstructive sleep apnea) [G47.33] 10/30/2014  . Osteoarthrosis, unspecified whether generalized or localized, involving lower leg [M17.10] 10/30/2014  . COPD (chronic obstructive pulmonary disease) ([place]) [J44.9] 10/30/2014  . Chronic diastolic CHF (congestive heart failure) ([place]) [I50.32] 05/15/2014  . Morbid obesity ([name]) [E66.01]   . History of colon polyps [Z86.010] 03/09/2013  . Renal mass [N28.89] 06/26/2011  . 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 Hypertension [I10] 06/24/2011  . Diabetes mellitus ([place]) [E11.9] 06/24/2011    Total Time spent with patient: 20 minutes  Subjective:   [name] [name] is a 72 y.o. female patient admitted with "I am doing excellent".  HPI: Patient seen this evening.  She has no new complaints.  She was able to lucidly ask me whether any placement had been found for her yet.  Patient denies any hallucinations denies suicidal thoughts.  She seemed a little more appropriate in her affect speaking with me this evening.  No sign of new acute dangerousness or  worsening  Past Psychiatric History: Long-standing schizoaffective disorder multiple hospitalizations  Risk to Self: Suicidal Ideation: No Suicidal Intent: No Is patient at risk for suicide?: No Suicidal Plan?: No Access to Means: No What has been your use of drugs/alcohol within the last 12 months?: Pt denies How many times?: 0 Other Self Harm Risks: n/a Triggers for Past Attempts: None known Intentional Self Injurious Behavior: None Risk to Others: Homicidal Ideation: No Thoughts of Harm to Others: No Current Homicidal Intent: No Current Homicidal Plan: No Access to Homicidal Means: No Identified Victim: n/a History of harm to others?: No Assessment of Violence: None Noted Violent Behavior Description: nonr reported Does patient have access to weapons?: No Criminal Charges Pending?: No Does patient have a court date: No Prior Inpatient Therapy: Prior Inpatient Therapy: Yes Prior Therapy Dates: Several Prior Therapy Facilty/Provider(s): [facility] Reason for Treatment: Schizophrenia Prior Outpatient Therapy: Prior Outpatient Therapy: No Does patient have an ACCT team?: No Does patient have Intensive In-House Services?  : No Does patient have [facility] services? : No Does patient have P4CC services?: No  Past Medical History:  Past Medical History:  Diagnosis Date  . Anxiety   . Arthritis   . Bronchitis   . Bursitis   . CAD (coronary artery disease)   . CHF (congestive heart failure) ([place])   . Chronic cough   . Chronic kidney disease    shadow on x-ray  . COPD (chronic obstructive pulmonary disease) ([place])   . Depression   . DM2 (diabetes mellitus, type 2) ([place])   . Environmental allergies   . GERD (gastroesophageal reflux disease)   . Hypercholesteremia   . Hypertension   . Left ventricular outflow tract obstruction    a. echo 03/2014:  EF 60-65%, hypernamic LV systolic fxn, mod LVH w/ LVOT gradient estimated at 68 mm Hg w/ valsalva, very small LV internal cavity  size, mildly increased LV posterior wall thickness, mild Ao valve scl w/o stenosis, diastolic dysfunction, normal RVSP  . Lower extremity edema   . LVH (left ventricular hypertrophy)    a. echo suggests long standing uncontrolled htn. she will not do well when dehydrated, LV cavity obliteration  . Muscle weakness   . Obesity   . On supplemental oxygen therapy    AS NEEDED  . OSA (obstructive sleep apnea)    does not use machine  . Osteoarthritis   . Schizophrenia ([place])   . Tremors of nervous system   . Wheezing     Past Surgical History:  Procedure Laterality Date  . CATARACT EXTRACTION W/PHACO Left 09/10/2014   Procedure: CATARACT EXTRACTION PHACO AND INTRAOCULAR LENS PLACEMENT (IOC);  Surgeon: Birder Robson, MD;  Location: [facility] ORS;  Service: Ophthalmology;  Laterality: Left;  [place]: 00:44 AP%: 22.8 CDE: 10.24 Fluid lot #[id number]  . CATARACT EXTRACTION W/PHACO Right 10/15/2014   Procedure: CATARACT EXTRACTION PHACO AND INTRAOCULAR LENS PLACEMENT (IOC);  Surgeon: Birder Robson, MD;  Location: [facility] ORS;  Service: Ophthalmology;  Laterality: Right;  [place]: 00:52 AP:40.1 CDE:11.83 LOT PACK #[id number]  . CHOLECYSTECTOMY    . COLONOSCOPY  04/2011   [facility] per patient incomplete  . EYE SURGERY    . JOINT REPLACEMENT     TKR  . KNEE RECONSTRUCTION, MEDIAL PATELLAR FEMORAL LIGAMENT    . RENAL BIOPSY    . TONSILLECTOMY    . TONSILLECTOMY     Family History:  Family History  Problem Relation Age of Onset  . Heart attack Mother   . Colon cancer Neg Hx   . Liver disease Neg Hx    Family Psychiatric  History: None Social History:  Social History   Substance and Sexual Activity  Alcohol Use No   Comment: occ.     Social History   Substance and Sexual Activity  Drug Use No    Social History   Socioeconomic History  . Marital status: Widowed    Spouse name: Not on file  . Number of children: 2  . Years of education: Not on file  . Highest education level: Not on file   Occupational History  . Not on file  Social Needs  . Financial resource strain: Not on file  . Food insecurity:    Worry: Not on file    Inability: Not on file  . Transportation needs:    Medical: Not on file    Non-medical: Not on file  Tobacco Use  . Smoking status: Former Smoker    Packs/day: 3.00    Years: 40.00    Pack years: 120.00  . Smokeless tobacco: Never Used  . Tobacco comment: quit in 2009  Substance and Sexual Activity  . Alcohol use: No    Comment: occ.  . Drug use: No  . Sexual activity: Not Currently  Lifestyle  . Physical activity:    Days per week: Not on file    Minutes per session: Not on file  . Stress: Not on file  Relationships  . Social connections:    Talks on phone: Not on file    Gets together: Not on file    Attends religious service: Not on file    Active member of club or organization: Not on file    Attends meetings of clubs or organizations: Not  on file    Relationship status: Not on file  Other Topics Concern  . Not on file  Social History Narrative  . Not on file   Additional Social History:    Allergies:   Allergies  Allergen Reactions  . Haldol [Haloperidol Decanoate] Swelling and Other (See Comments)    Reaction:  Swelling of tongue and blurred vision    . Metformin Diarrhea  . Prednisone Other (See Comments)    Unknown reaction  . Raspberry Swelling and Other (See Comments)    Reaction:  Swelling of lips     Labs:  Results for orders placed or performed during the hospital encounter of 07/27/17 (from the past 48 hour(s))  Glucose, capillary     Status: Abnormal   Collection Time: 08/08/17  8:53 PM  Result Value Ref Range   Glucose-Capillary 180 (H) 65 - 99 mg/dL  CBC     Status: Abnormal   Collection Time: 08/09/17  7:41 AM  Result Value Ref Range   WBC 7.8 3.6 - 11.0 K/uL   RBC 4.51 3.80 - 5.20 MIL/uL   Hemoglobin 13.4 12.0 - 16.0 g/dL   HCT 40.9 35.0 - 47.0 %   MCV 90.6 80.0 - 100.0 fL   MCH 29.6 26.0 -  34.0 pg   MCHC 32.7 32.0 - 36.0 g/dL   RDW 14.7 (H) 11.5 - 14.5 %   Platelets 263 150 - 440 K/uL    Comment: Performed at [facility], [address] [place], [place] [address]  Glucose, capillary     Status: Abnormal   Collection Time: 08/09/17  7:46 AM  Result Value Ref Range   Glucose-Capillary 142 (H) 65 - 99 mg/dL  Glucose, capillary     Status: Abnormal   Collection Time: 08/09/17 11:56 AM  Result Value Ref Range   Glucose-Capillary 204 (H) 65 - 99 mg/dL  Glucose, capillary     Status: Abnormal   Collection Time: 08/09/17  6:08 PM  Result Value Ref Range   Glucose-Capillary 151 (H) 65 - 99 mg/dL  Glucose, capillary     Status: Abnormal   Collection Time: 08/09/17  8:37 PM  Result Value Ref Range   Glucose-Capillary 130 (H) 65 - 99 mg/dL  Glucose, capillary     Status: Abnormal   Collection Time: 08/10/17  7:14 AM  Result Value Ref Range   Glucose-Capillary 111 (H) 65 - 99 mg/dL  Glucose, capillary     Status: Abnormal   Collection Time: 08/10/17 11:36 AM  Result Value Ref Range   Glucose-Capillary 175 (H) 65 - 99 mg/dL  Glucose, capillary     Status: Abnormal   Collection Time: 08/10/17  5:20 PM  Result Value Ref Range   Glucose-Capillary 144 (H) 65 - 99 mg/dL    Current Facility-Administered Medications  Medication Dose Route Frequency Provider Last Rate Last Dose  . acetaminophen (TYLENOL) tablet 650 mg  650 mg Oral Q6H PRN Gladstone Lighter, MD   650 mg at 08/09/17 1524   Or  . acetaminophen (TYLENOL) suppository 650 mg  650 mg Rectal Q6H PRN Gladstone Lighter, MD      . albuterol (PROVENTIL) (2.5 MG/3ML) 0.083% nebulizer solution 2.5 mg  2.5 mg Inhalation Q6H PRN Gregor Hams, MD   2.5 mg at 08/02/17 2241  . aspirin EC tablet 81 mg  81 mg Oral Daily Gregor Hams, MD   81 mg at 08/10/17 0944  . atorvastatin (LIPITOR) tablet 20 mg  20 mg  Oral QHS Gregor Hams, MD   20 mg at 08/09/17 2024  . benztropine (COGENTIN) tablet 0.5 mg  0.5 mg  Oral BID Gregor Hams, MD   0.5 mg at 08/10/17 0941  . carvedilol (COREG) tablet 12.5 mg  12.5 mg Oral BID Gregor Hams, MD   12.5 mg at 08/10/17 0942  . cholecalciferol (VITAMIN D) tablet 1,000 Units  1,000 Units Oral Daily Gregor Hams, MD   1,000 Units at 08/10/17 0941  . divalproex (DEPAKOTE) DR tablet 500 mg  500 mg Oral TID Gladstone Lighter, MD   500 mg at 08/10/17 1841  . docusate sodium (COLACE) capsule 200 mg  200 mg Oral Daily Gregor Hams, MD   200 mg at 08/10/17 0942  . famotidine (PEPCID) tablet 20 mg  20 mg Oral BID Gladstone Lighter, MD   20 mg at 08/10/17 0942  . feeding supplement (PRO-STAT SUGAR FREE 64) liquid 30 mL  30 mL Oral BID Gregor Hams, MD   30 mL at 08/10/17 0944  . fluPHENAZine (PROLIXIN) tablet 5 mg  5 mg Oral TID Arta Silence, MD   5 mg at 08/10/17 1841  . fluPHENAZine decanoate (PROLIXIN) injection 50 mg  50 mg Intramuscular Q14 Days Pucilowska, Jolanta B, MD   50 mg at 07/28/17 1615  . furosemide (LASIX) tablet 20 mg  20 mg Oral Daily [name], MD   20 mg at 08/10/17 0943  . glipiZIDE (GLUCOTROL) tablet 10 mg  10 mg Oral Daily Gregor Hams, MD   10 mg at 08/10/17 0941  . guaiFENesin (ROBITUSSIN) 100 MG/5ML solution 300 mg  15 mL Oral Q6H PRN Gregor Hams, MD   300 mg at 08/10/17 0542  . heparin injection 5,000 Units  5,000 Units Subcutaneous Q8H Gladstone Lighter, MD   5,000 Units at 08/10/17 1841  . insulin aspart (novoLOG) injection 0-15 Units  0-15 Units Subcutaneous TID WC Gregor Hams, MD   2 Units at 08/10/17 1723  . insulin aspart (novoLOG) injection 0-5 Units  0-5 Units Subcutaneous QHS Gregor Hams, MD   2 Units at 08/06/17 2158  . insulin glargine (LANTUS) injection 11 Units  11 Units Subcutaneous QHS Gregor Hams, MD   11 Units at 08/09/17 2113  . levothyroxine (SYNTHROID, LEVOTHROID) tablet 75 mcg  75 mcg Oral QAC breakfast Gladstone Lighter, MD   75 mcg at 08/10/17 0942  . lisinopril  (PRINIVIL,ZESTRIL) tablet 10 mg  10 mg Oral QHS [name], MD   10 mg at 08/09/17 2024  . loratadine (CLARITIN) tablet 10 mg  10 mg Oral Daily Gregor Hams, MD   10 mg at 08/10/17 0945  . magnesium hydroxide (MILK OF MAGNESIA) suspension 30 mL  30 mL Oral Daily PRN Gregor Hams, MD      . mometasone-formoterol [facility]) 200-5 MCG/ACT inhaler 2 puff  2 puff Inhalation BID Gladstone Lighter, MD   2 puff at 08/10/17 0945  . multivitamin with minerals tablet 1 tablet  1 tablet Oral Daily Gregor Hams, MD   1 tablet at 08/10/17 [phone]  . nitroGLYCERIN (NITROSTAT) SL tablet 0.4 mg  0.4 mg Sublingual Q5 min PRN Gregor Hams, MD      . ondansetron [facility]) tablet 4 mg  4 mg Oral Q6H PRN Gladstone Lighter, MD       Or  . ondansetron (ZOFRAN) injection 4 mg  4 mg Intravenous Q6H PRN Gladstone Lighter, MD  4 mg at 08/03/17 1822  . polyethylene glycol (MIRALAX / GLYCOLAX) packet 17 g  17 g Oral Daily PRN Gladstone Lighter, MD      . tiotropium [facility]) inhalation capsule 18 mcg  18 mcg Inhalation Daily Gregor Hams, MD   18 mcg at 08/10/17 0945    Musculoskeletal: Strength & Muscle Tone: decreased Gait & Station: unable to stand Patient leans: N/A  Psychiatric Specialty Exam: Physical Exam  Nursing note and vitals reviewed. Constitutional: She appears well-developed and well-nourished.  HENT:  Head: Normocephalic and atraumatic.  Eyes: Pupils are equal, round, and reactive to light. Conjunctivae are normal.  Neck: Normal range of motion.  Cardiovascular: Regular rhythm and normal heart sounds.  Respiratory: Effort normal. No respiratory distress.  GI: Soft.  Musculoskeletal: Normal range of motion.  Neurological: She is alert.  Skin: Skin is warm and dry.  Psychiatric: Her mood appears anxious. Her speech is delayed. She is slowed. Cognition and memory are impaired. She expresses inappropriate judgment. She expresses no suicidal ideation.    Review of Systems   Constitutional: Negative.   HENT: Negative.   Eyes: Negative.   Respiratory: Negative.   Cardiovascular: Negative.   Gastrointestinal: Negative.   Musculoskeletal: Negative.   Skin: Negative.   Neurological: Negative.   Psychiatric/Behavioral: Negative for depression, hallucinations, memory loss, substance abuse and suicidal ideas. The patient is nervous/anxious. The patient does not have insomnia.     Blood pressure (!) 122/46, pulse 63, temperature 98 F (36.7 C), temperature source Oral, resp. rate 16, height 5\' 6"  (1.676 m), weight 123.2 kg (271 lb 11.2 oz), SpO2 99 %.Body mass index is 43.85 kg/m.  General Appearance: Casual  Eye Contact:  Good  Speech:  Clear and Coherent  Volume:  Decreased  Mood:  Euthymic  Affect:  Constricted  Thought Process:  Goal Directed  Orientation:  Full (Time, Place, and Person)  Thought Content:  Illogical  Suicidal Thoughts:  No  Homicidal Thoughts:  No  Memory:  Immediate;   Fair Recent;   Fair Remote;   Fair  Judgement:  Impaired  Insight:  Lacking  Psychomotor Activity:  Decreased  Concentration:  Concentration: Fair  Recall:  [employer] of Knowledge:  Fair  Language:  Fair  Akathisia:  No  Handed:  Right  AIMS (if indicated):     Assets:  Desire for Improvement Social Support  ADL's:  Impaired  Cognition:  Impaired,  Mild  Sleep:        Treatment Plan Summary: Medication management and Plan Patient perhaps seems slightly better today.  No worse.  Continue off of IV C.  No change to her psychiatric medicine.  Continue to recommend [facility] if it be available but if not referral to appropriate skilled nursing facility or what ever level is correct.  Disposition: Supportive therapy provided about ongoing stressors.  Alethia Berthold, MD 08/10/2017 7:29 PM

## 2017-08-10 NOTE — Progress Notes (Signed)
Physical Therapy Treatment Patient Details Name: [name] [name] MRN: [id number] DOB: 08/09/1945 Today's Date: 08/10/2017    History of Present Illness 72 y.o. female presenting to ED on 6/5 for paranoid episode, admitted to hospital 6/12 w/ acute on chronic renal disease stage 3, hypoxia secondary to pulmonary edema, and schizophrenia. PMH includes arthritis, bronchitis, CAD, CHF, chronic cough, COPD, GERD, and left ventricular hypertrophy.    PT Comments    Pt up in chair. Gives little response toward therapist. Treatment given for basic exercises seated in chair. Pt again participates minimally requiring active assisted range of motion throughout. Does not demonstrate pain with movement.  No further mobility attempts. Continue PT to progress participation, strength, endurance to improve functional mobility toward baseline.    Follow Up Recommendations  SNF     Equipment Recommendations  Rolling walker with 5" wheels    Recommendations for Other Services       Precautions / Restrictions Precautions Precautions: Fall Restrictions Weight Bearing Restrictions: No    Mobility  Bed Mobility               General bed mobility comments: Not tested; pt up in chair  Transfers                    Ambulation/Gait                 Stairs             Wheelchair Mobility    Modified Rankin (Stroke Patients Only)       Balance                                            Cognition Arousal/Alertness: Awake/alert Behavior During Therapy: Flat affect Overall Cognitive Status: History of cognitive impairments - at baseline                                 General Comments: little response toward therapist or therapies      Exercises General Exercises - Lower Extremity Long Arc Quad: AAROM;Both;20 reps;Seated Hip ABduction/ADduction: AAROM;Both;20 reps;Seated Hip Flexion/Marching: AAROM;Both;20 reps;Seated Toe  Raises: AAROM;Both;20 reps;Seated    General Comments        Pertinent Vitals/Pain Pain Assessment: (does not demonstrate pain with LE movement)    Home Living                      Prior Function            PT Goals (current goals can now be found in the care plan section) Progress towards PT goals: Progressing toward goals(slowly)    Frequency    Min 2X/week      PT Plan Current plan remains appropriate    Co-evaluation              AM-PAC PT "6 Clicks" Daily Activity  Outcome Measure  Difficulty turning over in bed (including adjusting bedclothes, sheets and blankets)?: Unable Difficulty moving from lying on back to sitting on the side of the bed? : Unable Difficulty sitting down on and standing up from a chair with arms (e.g., wheelchair, bedside commode, etc,.)?: Unable Help needed moving to and from a bed to chair (including a wheelchair)?: A Lot Help needed walking in hospital room?: A Lot Help needed  climbing 3-5 steps with a railing? : Total 6 Click Score: 8    End of Session   Activity Tolerance: Other (comment)(little response/participation overall) Patient left: in chair;with call bell/phone within reach;with chair alarm set   PT Visit Diagnosis: Muscle weakness (generalized) (M62.81);History of falling (Z91.81);Difficulty in walking, not elsewhere classified (R26.2);Unsteadiness on feet (R26.81)     Time: 7673-4193 PT Time Calculation (min) (ACUTE ONLY): 20 min  Charges:  $Therapeutic Exercise: 8-22 mins                    G Codes:        Larae Grooms, PTA 08/10/2017, 12:19 PM

## 2017-08-10 NOTE — Progress Notes (Signed)
Chaplain was paged to visit . Chaplain visited patient seemed ready for bed. Pt was concerned about her placement and if her son would be able to see her. Chaplain encouraged her to live in this moment and not worry about what may happen. Her son visited her here, he may visit there. Chaplin encouraged her to breath to relax for the night. The pager alerted. Chaplain  Asked her to pray for him as he departed.    08/10/17 2300  Clinical Encounter Type  Visited With Patient  Visit Type Spiritual support  Referral From Nurse  Spiritual Encounters  Spiritual Needs Prayer;Emotional

## 2017-08-10 NOTE — Progress Notes (Signed)
[facility] [place] NAME: Jazmina Muhlenkamp    MR#:  [id number]  DATE OF BIRTH:  07/25/1945  SUBJECTIVE:   NO acute events overnight. Pt. Still tearful at times. Sitting up in chair and has no complaints.   REVIEW OF SYSTEMS:    Review of Systems  Constitutional: Negative for chills and fever.  HENT: Negative for congestion and tinnitus.   Eyes: Negative for blurred vision and double vision.  Respiratory: Negative for cough, shortness of breath and wheezing.   Cardiovascular: Negative for chest pain, orthopnea and PND.  Gastrointestinal: Negative for abdominal pain, diarrhea, nausea and vomiting.  Genitourinary: Negative for dysuria and hematuria.  Neurological: Negative for dizziness, sensory change and focal weakness.  All other systems reviewed and are negative.   Nutrition: Heart Healthy Tolerating Diet: Yes Tolerating PT: eval noted.    DRUG ALLERGIES:   Allergies  Allergen Reactions  . Haldol [Haloperidol Decanoate] Swelling and Other (See Comments)    Reaction:  Swelling of tongue and blurred vision    . Metformin Diarrhea  . Prednisone Other (See Comments)    Unknown reaction  . Raspberry Swelling and Other (See Comments)    Reaction:  Swelling of lips     VITALS:  Blood pressure (!) 110/91, pulse 61, temperature 98.1 F (36.7 C), temperature source Oral, resp. rate 18, height 5\' 6"  (1.676 m), weight 123.2 kg (271 lb 11.2 oz), SpO2 96 %.  PHYSICAL EXAMINATION:   Physical Exam  GENERAL:  72 y.o.-year-old obese patient sitting up in chair but in NAD EYES: Pupils equal, round, reactive to light and accommodation. No scleral icterus. Extraocular muscles intact.  HEENT: Head atraumatic, normocephalic. Oropharynx and nasopharynx clear.  NECK:  Supple, no jugular venous distention. No thyroid enlargement, no tenderness.  LUNGS: Normal breath sounds bilaterally, no wheezing, rales, rhonchi. No use of accessory muscles of  respiration.  CARDIOVASCULAR: S1, S2 normal. No murmurs, rubs, or gallops.  ABDOMEN: Soft, nontender, nondistended. Bowel sounds present. No organomegaly or mass.  EXTREMITIES: No cyanosis, clubbing, + 1 edema b/l.     NEUROLOGIC: Cranial nerves II through XII are intact. No focal Motor or sensory deficits b/l.  Globally weak.  PSYCHIATRIC: The patient is alert and oriented x 1.   SKIN: No obvious rash, lesion, or ulcer.    LABORATORY PANEL:   CBC Recent Labs  Lab 08/09/17 0741  WBC 7.8  HGB 13.4  HCT 40.9  PLT 263   ------------------------------------------------------------------------------------------------------------------  Chemistries  Recent Labs  Lab 08/07/17 0526  NA 139  K 4.2  CL 101  CO2 31  GLUCOSE 98  BUN 19  CREATININE 1.12*  CALCIUM 8.8*   ------------------------------------------------------------------------------------------------------------------  Cardiac Enzymes No results for input(s): TROPONINI in the last 168 hours. ------------------------------------------------------------------------------------------------------------------  RADIOLOGY:  No results found.   ASSESSMENT AND PLAN:   Swartz a58 y.o.femalewith a known history of schizophrenia with paranoid behavior, hypertension, diabetes, hypertrophic cardiomyopathy with congestive heart failure, depression, CAD presents from [facility] and has been in the emergency room since 07/27/2017 for paranoid behavior.  1.Acute renal failure on CKD stage III.  - Cr. Improved and back to baseline after some IV fluids.   - cont. Lisinopril.   2. Hypoxia  - was due to mild Pulm. Edema but now resolved.   - cont. Lasix. Incentive Spirometry.    3. Schizophrenia with paranoid behavior- IVC has been discontinued now as per Psych.  - cont. Current Psych meds with Proloxin,  depakote, Cogentin.   - await SNF placement.   4. Diabetes mellitus-continue Lantus, SSI and  glipizide.  - BS stable.   5. Hypothyroidism-continue Synthroid  6. Hypertension - cont. Lisinopril.    7. Hyperlipidemia - cont. Atorvastatin.   Awaiting SNF placement.     All the records are reviewed and case discussed with Care Management/Social Worker. Management plans discussed with the patient, family and they are in agreement.  CODE STATUS: Full code  DVT Prophylaxis: Hep SQ  TOTAL TIME TAKING CARE OF THIS PATIENT: 25 minutes.   POSSIBLE D/C in 1-2 days to SNF. Discussed with Education officer, museum.     [name] M.D on 08/10/2017 at 1:11 PM  Between 7am to 6pm - Pager - [phone]  After 6pm go to [email or web address] - Proofreader  [facility]  Office  [phone]  CC: Primary care physician; Patient, No Pcp Per

## 2017-08-11 DIAGNOSIS — F250 Schizoaffective disorder, bipolar type: Secondary | ICD-10-CM | POA: Diagnosis not present

## 2017-08-11 LAB — GLUCOSE, CAPILLARY
GLUCOSE-CAPILLARY: 167 mg/dL — AB (ref 65–99)
Glucose-Capillary: 133 mg/dL — ABNORMAL HIGH (ref 65–99)
Glucose-Capillary: 197 mg/dL — ABNORMAL HIGH (ref 65–99)
Glucose-Capillary: 155 mg/dL — ABNORMAL HIGH (ref 65–99)

## 2017-08-11 NOTE — Clinical Social Work Note (Signed)
CSW contacted [facility] again today regarding patient. Per their intake, patient would have to be under IVC in order to come to [facility]. Currently, patient is not appropriate for IVC and therefore would not be able to go to [place] will continue to seek placement in SNF for long term care.   [place], [place]

## 2017-08-11 NOTE — Plan of Care (Signed)

## 2017-08-11 NOTE — Progress Notes (Signed)
[place] at [place] NAME: [name] [name]    MR#:  [id number]  DATE OF BIRTH:  1945/03/20  SUBJECTIVE:   No acute events overnight. Awaiting SNF/STR placement.    REVIEW OF SYSTEMS:    Review of Systems  Constitutional: Negative for chills and fever.  HENT: Negative for congestion and tinnitus.   Eyes: Negative for blurred vision and double vision.  Respiratory: Negative for cough, shortness of breath and wheezing.   Cardiovascular: Negative for chest pain, orthopnea and PND.  Gastrointestinal: Negative for abdominal pain, diarrhea, nausea and vomiting.  Genitourinary: Negative for dysuria and hematuria.  Neurological: Negative for dizziness, sensory change and focal weakness.  All other systems reviewed and are negative.   Nutrition: Heart Healthy Tolerating Diet: Yes Tolerating PT: eval noted.    DRUG ALLERGIES:   Allergies  Allergen Reactions  . Haldol [Haloperidol Decanoate] Swelling and Other (See Comments)    Reaction:  Swelling of tongue and blurred vision    . Metformin Diarrhea  . Prednisone Other (See Comments)    Unknown reaction  . Raspberry Swelling and Other (See Comments)    Reaction:  Swelling of lips     VITALS:  Blood pressure (!) 141/79, pulse 61, temperature 98.7 F (37.1 C), resp. rate 20, height 5\' 6"  (1.676 m), weight 123.2 kg (271 lb 11.2 oz), SpO2 95 %.  PHYSICAL EXAMINATION:   Physical Exam  GENERAL:  72 y.o.-year-old obese patient lying in bed but in NAD EYES: Pupils equal, round, reactive to light and accommodation. No scleral icterus. Extraocular muscles intact.  HEENT: Head atraumatic, normocephalic. Oropharynx and nasopharynx clear.  NECK:  Supple, no jugular venous distention. No thyroid enlargement, no tenderness.  LUNGS: Normal breath sounds bilaterally, no wheezing, rales, rhonchi. No use of accessory muscles of respiration.  CARDIOVASCULAR: S1, S2 normal. No murmurs, rubs, or gallops.   ABDOMEN: Soft, nontender, nondistended. Bowel sounds present. No organomegaly or mass.  EXTREMITIES: No cyanosis, clubbing, + 1 edema b/l.     NEUROLOGIC: Cranial nerves II through XII are intact. No focal Motor or sensory deficits b/l.  Globally weak.  PSYCHIATRIC: The patient is alert and oriented x 1.   SKIN: No obvious rash, lesion, or ulcer.    LABORATORY PANEL:   CBC Recent Labs  Lab 08/09/17 0741  WBC 7.8  HGB 13.4  HCT 40.9  PLT 263   ------------------------------------------------------------------------------------------------------------------  Chemistries  Recent Labs  Lab 08/07/17 0526  NA 139  K 4.2  CL 101  CO2 31  GLUCOSE 98  BUN 19  CREATININE 1.12*  CALCIUM 8.8*   ------------------------------------------------------------------------------------------------------------------  Cardiac Enzymes No results for input(s): TROPONINI in the last 168 hours. ------------------------------------------------------------------------------------------------------------------  RADIOLOGY:  No results found.   ASSESSMENT AND PLAN:   [name] [name] a72 y.o.femalewith a known history of schizophrenia with paranoid behavior, hypertension, diabetes, hypertrophic cardiomyopathy with congestive heart failure, depression, CAD presents from [facility] and has been in the emergency room since 07/27/2017 for paranoid behavior.  1.Acute renal failure on CKD stage III.  - Cr. Improved and back to baseline now. - cont. Lisinopril.   2. Hypoxia  - was due to mild Pulm. Edema but now resolved.   - cont. Lasix. Incentive Spirometry.    3. Schizophrenia with paranoid behavior- IVC has been discontinued now as per Psych.  - cont. Current Psych meds with Proloxin, depakote, Cogentin.   - await SNF placement as pt. Has no behavioral issues presently.   4.  Diabetes mellitus-continue Lantus, SSI and glipizide.  - BS stable.   5. Hypothyroidism-continue  Synthroid  6. Hypertension - cont. Lisinopril.    7. Hyperlipidemia - cont. Atorvastatin.   Awaiting SNF placement.     All the records are reviewed and case discussed with Care Management/Social Worker. Management plans discussed with the patient, family and they are in agreement.  CODE STATUS: Full code  DVT Prophylaxis: Hep SQ  TOTAL TIME TAKING CARE OF THIS PATIENT: 25 minutes.   POSSIBLE D/C in 1-2 days to SNF. Discussed with Education officer, museum.     [name] [name] M.D on 08/11/2017 at 1:44 PM  Between 7am to 6pm - Pager - [phone]  After 6pm go to [email or web address] - Proofreader  [facility] [place] [facility]  Office  [phone]  CC: Primary care physician; Patient, No Pcp Per

## 2017-08-11 NOTE — Progress Notes (Signed)
Nutrition Follow-up  DOCUMENTATION CODES:   Morbid obesity  INTERVENTION:   - Continue 30 ml Pro-Stat BID, each provides 100 kcal and 15 grams protein  - Continue daily MVI  Per discussion with NT, pt has been coughing frequently while eating. Recommend SLP consult to determine appropriate diet and fluid consistency.  NUTRITION DIAGNOSIS:   Increased nutrient needs related to catabolic illness(COPD, CHF) as evidenced by estimated needs.  Ongoing, addressed via oral nutrition supplements  GOAL:   Patient will meet greater than or equal to 90% of their needs  Improving  MONITOR:   PO intake, Supplement acceptance, Labs, Weight trends, Skin, I & O's  REASON FOR ASSESSMENT:   Malnutrition Screening Tool    ASSESSMENT:   72 year old female with PMHx of DM type 2, HTN, schizophrenia, OA, OSA, LVH, CHF, anxiety, COPD, CAD, CKD, GERD, arthritis, depression who presented from [facility] with paranoid behavior found to have AKI on CKD III.  Pt is currently awaiting a GERI-psych placement at this time.  Spoke with pt and NT at bedside. Also discussed pt with RN.  Pt eating lunch at time of RD visit. Pt states that she likes Pro-Stat. RD to continue order.  Per NT, pt has been coughing frequently while eating. RD recommends SLP consult to determine appropriate diet and fluid consistency.  Meal Completion: 50-100% since 08/08/17  Medications reviewed and include: 1000 units vitamin D daily, 500 mg Depakote TID, 200 mg Colace daily, 20 mg Pepcid BID, 20 ml Pro-stat BID, 20 mg lasix daily, 10 mg Glucotrol daily, sliding scale Novolog, 11 units Lantus daily, 75 mcg levothyroxine daily, MVI with minerals daily  Labs reviewed. CBG's: 133, 201, 144, 175 x 24 hours   Diet Order:   Diet Order           Diet Heart Room service appropriate? Yes; Fluid consistency: Thin  Diet effective now          EDUCATION NEEDS:   Not appropriate for education at this time  Skin:   Skin Assessment: Skin Integrity Issues:(MSAD to breast and groin)  Last BM:  08/08/17 large type 3  Height:   Ht Readings from Last 1 Encounters:  08/03/17 5\' 6"  (1.676 m)    Weight:   Wt Readings from Last 1 Encounters:  08/03/17 271 lb 11.2 oz (123.2 kg)    Ideal Body Weight:  59.1 kg  BMI:  Body mass index is 43.85 kg/m.  Estimated Nutritional Needs:   Kcal:  1940-2115 (MSJ x 1.1-1.2)  Protein:  110-120 grams (0.9-1 grams/kg)  Fluid:  1.5-1.8 L/day (25-30 mL/kg IBW)    Gaynell Face, MS, RD, LDN Pager: [phone] Weekend/After Hours: [phone]

## 2017-08-12 DIAGNOSIS — F250 Schizoaffective disorder, bipolar type: Secondary | ICD-10-CM | POA: Diagnosis not present

## 2017-08-12 LAB — GLUCOSE, CAPILLARY
GLUCOSE-CAPILLARY: 99 mg/dL (ref 65–99)
Glucose-Capillary: 210 mg/dL — ABNORMAL HIGH (ref 65–99)
Glucose-Capillary: 106 mg/dL — ABNORMAL HIGH (ref 65–99)
Glucose-Capillary: 127 mg/dL — ABNORMAL HIGH (ref 65–99)

## 2017-08-12 NOTE — Progress Notes (Signed)
[place] at [place] NAME: [name] [name]    MR#:  [id number]  DATE OF BIRTH:  1945/03/22  SUBJECTIVE:   No acute complaints or events overnight.  Patient awaiting insurance authorization for short-term rehab placement.  REVIEW OF SYSTEMS:    Review of Systems  Constitutional: Negative for chills and fever.  HENT: Negative for congestion and tinnitus.   Eyes: Negative for blurred vision and double vision.  Respiratory: Negative for cough, shortness of breath and wheezing.   Cardiovascular: Negative for chest pain, orthopnea and PND.  Gastrointestinal: Negative for abdominal pain, diarrhea, nausea and vomiting.  Genitourinary: Negative for dysuria and hematuria.  Neurological: Negative for dizziness, sensory change and focal weakness.  All other systems reviewed and are negative.   Nutrition: Heart Healthy Tolerating Diet: Yes Tolerating PT: eval noted.    DRUG ALLERGIES:   Allergies  Allergen Reactions  . Haldol [Haloperidol Decanoate] Swelling and Other (See Comments)    Reaction:  Swelling of tongue and blurred vision    . Metformin Diarrhea  . Prednisone Other (See Comments)    Unknown reaction  . Raspberry Swelling and Other (See Comments)    Reaction:  Swelling of lips     VITALS:  Blood pressure (!) 147/53, pulse (!) 56, temperature (!) 97.5 F (36.4 C), temperature source Oral, resp. rate 20, height 5\' 6"  (1.676 m), weight 123.2 kg (271 lb 11.2 oz), SpO2 97 %.  PHYSICAL EXAMINATION:   Physical Exam  GENERAL:  72 y.o.-year-old obese sitting up chair but in NAD EYES: Pupils equal, round, reactive to light and accommodation. No scleral icterus. Extraocular muscles intact.  HEENT: Head atraumatic, normocephalic. Oropharynx and nasopharynx clear.  NECK:  Supple, no jugular venous distention. No thyroid enlargement, no tenderness.  LUNGS: Normal breath sounds bilaterally, no wheezing, rales, rhonchi. No use of accessory  muscles of respiration.  CARDIOVASCULAR: S1, S2 normal. No murmurs, rubs, or gallops.  ABDOMEN: Soft, nontender, nondistended. Bowel sounds present. No organomegaly or mass.  EXTREMITIES: No cyanosis, clubbing, + 1 edema b/l.     NEUROLOGIC: Cranial nerves II through XII are intact. No focal Motor or sensory deficits b/l.  Globally weak.  PSYCHIATRIC: The patient is alert and oriented x 1.   SKIN: No obvious rash, lesion, or ulcer.    LABORATORY PANEL:   CBC Recent Labs  Lab 08/09/17 0741  WBC 7.8  HGB 13.4  HCT 40.9  PLT 263   ------------------------------------------------------------------------------------------------------------------  Chemistries  Recent Labs  Lab 08/07/17 0526  NA 139  K 4.2  CL 101  CO2 31  GLUCOSE 98  BUN 19  CREATININE 1.12*  CALCIUM 8.8*   ------------------------------------------------------------------------------------------------------------------  Cardiac Enzymes No results for input(s): TROPONINI in the last 168 hours. ------------------------------------------------------------------------------------------------------------------  RADIOLOGY:  No results found.   ASSESSMENT AND PLAN:   [place] a72 y.o.femalewith a known history of schizophrenia with paranoid behavior, hypertension, diabetes, hypertrophic cardiomyopathy with congestive heart failure, depression, CAD presents from [facility] and has been in the emergency room since 07/27/2017 for paranoid behavior.  1.Acute renal failure on CKD stage III.  - Cr. Improved and back to baseline now. - cont. Lisinopril.   2. Hypoxia  - was due to mild Pulm. Edema but now resolved.   - cont. Lasix. Incentive Spirometry.    3. Schizophrenia with paranoid behavior- pt's IVC has been discontinued now as per Psych.  - cont. Current Psych meds with Proloxin, depakote, Cogentin.   - await SNF  placement as pt. Has no behavioral issues presently.  Awaiting  Insurance authorization.   4. Diabetes mellitus-continue Lantus, SSI and glipizide.  - BS stable.   5. Hypothyroidism-continue Synthroid  6. Hypertension - cont. Lisinopril.    7. Hyperlipidemia - cont. Atorvastatin.   Awaiting insurance authorization for SNF placement.  [facility] has accepted him.    All the records are reviewed and case discussed with Care Management/Social Worker. Management plans discussed with the patient, family and they are in agreement.  CODE STATUS: Full code  DVT Prophylaxis: Hep SQ  TOTAL TIME TAKING CARE OF THIS PATIENT: 25 minutes.   POSSIBLE D/C in 1-2 days to SNF. Discussed with Education officer, museum.     [name] M.D on 08/12/2017 at 1:00 PM  Between 7am to 6pm - Pager - [phone]  After 6pm go to [email or web address] - Proofreader  [facility] [place] [facility]  Office  [phone]  CC: Primary care physician; Patient, No Pcp Per

## 2017-08-12 NOTE — Consult Note (Signed)
[place] [facility] Consult   Reason for Consult: Follow-up consult 72 year old woman with schizoaffective disorder.  Patient seen chart reviewed.  Patient was sitting up wide awake in her room this afternoon.  Greeted me by name.  Carefully told me that she thinks that they have found a place for her to live.  Patient says her mood is feeling "wonderful".  She did not become tearful during the interview.  Did not become disorganized or psychotic.  Patient was trying to make a telephone call to her son but was having trouble operating the phone.  I helped her to place the call.  Patient had no new complaints. Referring Physician: Verdell Carmine Patient Identification: [name] [name] MRN:  [id number] Principal Diagnosis: Schizoaffective disorder, bipolar type [facility]) Diagnosis:   Patient Active Problem List   Diagnosis Date Noted  . Acute renal failure (ARF) ([place]) [N17.9] 08/05/2017  . ARF (acute renal failure) ([place]) [N17.9] 08/03/2017  . UTI (urinary tract infection) [N39.0] 01/16/2016  . Altered mental status [R41.82] 01/16/2016  . Metabolic encephalopathy [V78.46]   . Acute on chronic respiratory failure with hypoxia and hypercapnia (HCC) [N62.95, J96.22] 10/13/2015  . Involuntary commitment [Z04.6] 09/02/2015  . Schizoaffective disorder, bipolar type ([place]) [F25.0]   . Urinary incontinence [R32] 10/30/2014  . GERD (gastroesophageal reflux disease) [K21.9] 10/30/2014  . OSA (obstructive sleep apnea) [G47.33] 10/30/2014  . Osteoarthrosis, unspecified whether generalized or localized, involving lower leg [M17.10] 10/30/2014  . COPD (chronic obstructive pulmonary disease) ([place]) [J44.9] 10/30/2014  . Chronic diastolic CHF (congestive heart failure) ([place]) [I50.32] 05/15/2014  . Morbid obesity ([place]) [E66.01]   . History of colon polyps [Z86.010] 03/09/2013  . Renal mass [N28.89] 06/26/2011  . 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 Hypertension [I10] 06/24/2011  . Diabetes mellitus  ([place]) [E11.9] 06/24/2011    Total Time spent with patient: 20 minutes  Subjective:   [name] [name] is a 72 y.o. female patient admitted with "I am feeling wonderful"  HPI: See note above.  Patient with chronic paranoia and schizoaffective disorder also multiple medical problems.  Psychiatrically she seems to have settled down and improved.  She is not showing the kind of mood lability that she was showing several days ago.  Does not become tearful spontaneously.  Not making bizarre or paranoid statements regularly.  Seems to be cooperating appropriately with discharge planning.  Past Psychiatric History: Long history of mental health problems.  Patient really cannot take care of herself independently.  Son is now guardian.  Risk to Self: Suicidal Ideation: No Suicidal Intent: No Is patient at risk for suicide?: No Suicidal Plan?: No Access to Means: No What has been your use of drugs/alcohol within the last 12 months?: Pt denies How many times?: 0 Other Self Harm Risks: n/a Triggers for Past Attempts: None known Intentional Self Injurious Behavior: None Risk to Others: Homicidal Ideation: No Thoughts of Harm to Others: No Current Homicidal Intent: No Current Homicidal Plan: No Access to Homicidal Means: No Identified Victim: n/a History of harm to others?: No Assessment of Violence: None Noted Violent Behavior Description: nonr reported Does patient have access to weapons?: No Criminal Charges Pending?: No Does patient have a court date: No Prior Inpatient Therapy: Prior Inpatient Therapy: Yes Prior Therapy Dates: Several Prior Therapy Facilty/Provider(s): [facility] Reason for Treatment: Schizophrenia Prior Outpatient Therapy: Prior Outpatient Therapy: No Does patient have an ACCT team?: No Does patient have Intensive In-House Services?  : No Does patient have [facility] services? : No Does  patient have P4CC services?: No  Past Medical History:  Past Medical History:   Diagnosis Date  . Anxiety   . Arthritis   . Bronchitis   . Bursitis   . CAD (coronary artery disease)   . CHF (congestive heart failure) ([place])   . Chronic cough   . Chronic kidney disease    shadow on x-ray  . COPD (chronic obstructive pulmonary disease) ([place])   . Depression   . DM2 (diabetes mellitus, type 2) ([place])   . Environmental allergies   . GERD (gastroesophageal reflux disease)   . Hypercholesteremia   . Hypertension   . Left ventricular outflow tract obstruction    a. echo 03/2014: EF 60-65%, hypernamic LV systolic fxn, mod LVH w/ LVOT gradient estimated at 68 mm Hg w/ valsalva, very small LV internal cavity size, mildly increased LV posterior wall thickness, mild Ao valve scl w/o stenosis, diastolic dysfunction, normal RVSP  . Lower extremity edema   . LVH (left ventricular hypertrophy)    a. echo suggests long standing uncontrolled htn. she will not do well when dehydrated, LV cavity obliteration  . Muscle weakness   . Obesity   . On supplemental oxygen therapy    AS NEEDED  . OSA (obstructive sleep apnea)    does not use machine  . Osteoarthritis   . Schizophrenia ([place])   . Tremors of nervous system   . Wheezing     Past Surgical History:  Procedure Laterality Date  . CATARACT EXTRACTION W/PHACO Left 09/10/2014   Procedure: CATARACT EXTRACTION PHACO AND INTRAOCULAR LENS PLACEMENT (IOC);  Surgeon: Birder Robson, MD;  Location: [facility] ORS;  Service: Ophthalmology;  Laterality: Left;  [place]: 00:44 AP%: 22.8 CDE: 10.24 Fluid lot #[id number]  . CATARACT EXTRACTION W/PHACO Right 10/15/2014   Procedure: CATARACT EXTRACTION PHACO AND INTRAOCULAR LENS PLACEMENT (IOC);  Surgeon: Birder Robson, MD;  Location: [facility] ORS;  Service: Ophthalmology;  Laterality: Right;  [place]: 00:52 AP:40.1 CDE:11.83 LOT PACK #[id number]  . CHOLECYSTECTOMY    . COLONOSCOPY  04/2011   [facility] per patient incomplete  . EYE SURGERY    . JOINT REPLACEMENT     TKR  . KNEE RECONSTRUCTION, MEDIAL PATELLAR  FEMORAL LIGAMENT    . RENAL BIOPSY    . TONSILLECTOMY    . TONSILLECTOMY     Family History:  Family History  Problem Relation Age of Onset  . Heart attack Mother   . Colon cancer Neg Hx   . Liver disease Neg Hx    Family Psychiatric  History: None known Social History:  Social History   Substance and Sexual Activity  Alcohol Use No   Comment: occ.     Social History   Substance and Sexual Activity  Drug Use No    Social History   Socioeconomic History  . Marital status: Widowed    Spouse name: Not on file  . Number of children: 2  . Years of education: Not on file  . Highest education level: Not on file  Occupational History  . Not on file  Social Needs  . Financial resource strain: Not on file  . Food insecurity:    Worry: Not on file    Inability: Not on file  . Transportation needs:    Medical: Not on file    Non-medical: Not on file  Tobacco Use  . Smoking status: Former Smoker    Packs/day: 3.00    Years: 40.00    Pack years: 120.00  . Smokeless tobacco:  Never Used  . Tobacco comment: quit in 2009  Substance and Sexual Activity  . Alcohol use: No    Comment: occ.  . Drug use: No  . Sexual activity: Not Currently  Lifestyle  . Physical activity:    Days per week: Not on file    Minutes per session: Not on file  . Stress: Not on file  Relationships  . Social connections:    Talks on phone: Not on file    Gets together: Not on file    Attends religious service: Not on file    Active member of club or organization: Not on file    Attends meetings of clubs or organizations: Not on file    Relationship status: Not on file  Other Topics Concern  . Not on file  Social History Narrative  . Not on file   Additional Social History:    Allergies:   Allergies  Allergen Reactions  . Haldol [Haloperidol Decanoate] Swelling and Other (See Comments)    Reaction:  Swelling of tongue and blurred vision    . Metformin Diarrhea  . Prednisone Other  (See Comments)    Unknown reaction  . Raspberry Swelling and Other (See Comments)    Reaction:  Swelling of lips     Labs:  Results for orders placed or performed during the hospital encounter of 07/27/17 (from the past 48 hour(s))  Glucose, capillary     Status: Abnormal   Collection Time: 08/10/17  5:20 PM  Result Value Ref Range   Glucose-Capillary 144 (H) 65 - 99 mg/dL  Glucose, capillary     Status: Abnormal   Collection Time: 08/10/17  9:13 PM  Result Value Ref Range   Glucose-Capillary 201 (H) 65 - 99 mg/dL  Glucose, capillary     Status: Abnormal   Collection Time: 08/11/17  7:38 AM  Result Value Ref Range   Glucose-Capillary 133 (H) 65 - 99 mg/dL  Glucose, capillary     Status: Abnormal   Collection Time: 08/11/17 11:42 AM  Result Value Ref Range   Glucose-Capillary 167 (H) 65 - 99 mg/dL  Glucose, capillary     Status: Abnormal   Collection Time: 08/11/17  5:02 PM  Result Value Ref Range   Glucose-Capillary 197 (H) 65 - 99 mg/dL  Glucose, capillary     Status: Abnormal   Collection Time: 08/11/17  9:05 PM  Result Value Ref Range   Glucose-Capillary 155 (H) 65 - 99 mg/dL  Glucose, capillary     Status: None   Collection Time: 08/12/17  7:31 AM  Result Value Ref Range   Glucose-Capillary 99 65 - 99 mg/dL  Glucose, capillary     Status: Abnormal   Collection Time: 08/12/17 11:46 AM  Result Value Ref Range   Glucose-Capillary 210 (H) 65 - 99 mg/dL    Current Facility-Administered Medications  Medication Dose Route Frequency Provider Last Rate Last Dose  . acetaminophen (TYLENOL) tablet 650 mg  650 mg Oral Q6H PRN Gladstone Lighter, MD   650 mg at 08/12/17 1027   Or  . acetaminophen (TYLENOL) suppository 650 mg  650 mg Rectal Q6H PRN Gladstone Lighter, MD      . albuterol (PROVENTIL) (2.5 MG/3ML) 0.083% nebulizer solution 2.5 mg  2.5 mg Inhalation Q6H PRN Gregor Hams, MD   2.5 mg at 08/02/17 2241  . aspirin EC tablet 81 mg  81 mg Oral Daily Gregor Hams, MD   81 mg at 08/12/17 0835  .  atorvastatin (LIPITOR) tablet 20 mg  20 mg Oral QHS Gregor Hams, MD   20 mg at 08/11/17 2222  . benztropine (COGENTIN) tablet 0.5 mg  0.5 mg Oral BID Gregor Hams, MD   0.5 mg at 08/12/17 0836  . carvedilol (COREG) tablet 12.5 mg  12.5 mg Oral BID Gregor Hams, MD   12.5 mg at 08/12/17 0835  . cholecalciferol (VITAMIN D) tablet 1,000 Units  1,000 Units Oral Daily Gregor Hams, MD   1,000 Units at 08/12/17 [phone]  . divalproex (DEPAKOTE) DR tablet 500 mg  500 mg Oral TID Gladstone Lighter, MD   500 mg at 08/12/17 1539  . docusate sodium (COLACE) capsule 200 mg  200 mg Oral Daily Gregor Hams, MD   200 mg at 08/12/17 0835  . famotidine (PEPCID) tablet 20 mg  20 mg Oral BID Gladstone Lighter, MD   20 mg at 08/12/17 0836  . feeding supplement (PRO-STAT SUGAR FREE 64) liquid 30 mL  30 mL Oral BID Gregor Hams, MD   30 mL at 08/12/17 1025  . fluPHENAZine (PROLIXIN) tablet 5 mg  5 mg Oral TID Arta Silence, MD   5 mg at 08/12/17 1538  . fluPHENAZine decanoate (PROLIXIN) injection 50 mg  50 mg Intramuscular Q14 Days Pucilowska, Jolanta B, MD   50 mg at 08/11/17 1822  . furosemide (LASIX) tablet 20 mg  20 mg Oral Daily [name], MD   20 mg at 08/12/17 0836  . glipiZIDE (GLUCOTROL) tablet 10 mg  10 mg Oral Daily Gregor Hams, MD   10 mg at 08/12/17 0836  . guaiFENesin (ROBITUSSIN) 100 MG/5ML solution 300 mg  15 mL Oral Q6H PRN Gregor Hams, MD   300 mg at 08/12/17 1025  . heparin injection 5,000 Units  5,000 Units Subcutaneous Q8H Gladstone Lighter, MD   5,000 Units at 08/12/17 1539  . insulin aspart (novoLOG) injection 0-15 Units  0-15 Units Subcutaneous TID WC Gregor Hams, MD   5 Units at 08/12/17 1249  . insulin aspart (novoLOG) injection 0-5 Units  0-5 Units Subcutaneous QHS Gregor Hams, MD   2 Units at 08/10/17 2137  . insulin glargine (LANTUS) injection 11 Units  11 Units Subcutaneous QHS Gregor Hams, MD   11 Units at 08/11/17 2224  . levothyroxine (SYNTHROID, LEVOTHROID) tablet 75 mcg  75 mcg Oral QAC breakfast Gladstone Lighter, MD   75 mcg at 08/12/17 0836  . lisinopril (PRINIVIL,ZESTRIL) tablet 10 mg  10 mg Oral QHS [name], MD   10 mg at 08/11/17 2222  . loratadine (CLARITIN) tablet 10 mg  10 mg Oral Daily Gregor Hams, MD   10 mg at 08/12/17 0836  . magnesium hydroxide (MILK OF MAGNESIA) suspension 30 mL  30 mL Oral Daily PRN Gregor Hams, MD   30 mL at 08/10/17 2208  . mometasone-formoterol (DULERA) 200-5 MCG/ACT inhaler 2 puff  2 puff Inhalation BID Gladstone Lighter, MD   2 puff at 08/12/17 1026  . multivitamin with minerals tablet 1 tablet  1 tablet Oral Daily Gregor Hams, MD   1 tablet at 08/12/17 1026  . nitroGLYCERIN (NITROSTAT) SL tablet 0.4 mg  0.4 mg Sublingual Q5 min PRN Gregor Hams, MD      . ondansetron [facility]) tablet 4 mg  4 mg Oral Q6H PRN Gladstone Lighter, MD   4 mg at 08/12/17 1039   Or  . ondansetron (ZOFRAN) injection  4 mg  4 mg Intravenous Q6H PRN Gladstone Lighter, MD   4 mg at 08/03/17 1822  . polyethylene glycol (MIRALAX / GLYCOLAX) packet 17 g  17 g Oral Daily PRN Gladstone Lighter, MD      . tiotropium [facility]) inhalation capsule 18 mcg  18 mcg Inhalation Daily Gregor Hams, MD   18 mcg at 08/12/17 1026    Musculoskeletal: Strength & Muscle Tone: decreased Gait & Station: unable to stand Patient leans: N/A  Psychiatric Specialty Exam: Physical Exam  Nursing note and vitals reviewed. Constitutional: She appears well-developed and well-nourished.  HENT:  Head: Normocephalic and atraumatic.  Eyes: Pupils are equal, round, and reactive to light. Conjunctivae are normal.  Neck: Normal range of motion.  Cardiovascular: Regular rhythm and normal heart sounds.  Respiratory: Effort normal. No respiratory distress.  GI: Soft.  Musculoskeletal: Normal range of motion.  Neurological: She is alert.  Skin:  Skin is warm and dry.  Psychiatric: She has a normal mood and affect. Her speech is normal. She is slowed. Cognition and memory are impaired. She expresses impulsivity. She expresses no homicidal and no suicidal ideation.    Review of Systems  Constitutional: Negative.   HENT: Negative.   Eyes: Negative.   Respiratory: Negative.   Cardiovascular: Negative.   Gastrointestinal: Negative.   Musculoskeletal: Negative.   Skin: Negative.   Neurological: Negative.   Psychiatric/Behavioral: Negative.     Blood pressure 127/66, pulse 60, temperature 98.3 F (36.8 C), temperature source Oral, resp. rate 20, height 5\' 6"  (1.676 m), weight 123.2 kg (271 lb 11.2 oz), SpO2 92 %.Body mass index is 43.85 kg/m.  General Appearance: Casual  Eye Contact:  Good  Speech:  Clear and Coherent  Volume:  Decreased  Mood:  Euthymic  Affect:  Congruent  Thought Process:  Goal Directed  Orientation:  Full (Time, Place, and Person)  Thought Content:  Logical  Suicidal Thoughts:  No  Homicidal Thoughts:  No  Memory:  Immediate;   Fair Recent;   Fair Remote;   Fair  Judgement:  Impaired  Insight:  Shallow  Psychomotor Activity:  Decreased  Concentration:  Concentration: Fair  Recall:  [employer] of Knowledge:  Fair  Language:  Fair  Akathisia:  No  Handed:  Right  AIMS (if indicated):     Assets:  Communication Skills Desire for Improvement Financial Resources/Insurance Resilience Social Support  ADL's:  Impaired  Cognition:  Impaired,  Mild  Sleep:        Treatment Plan Summary: Plan 72 year old woman with schizoaffective disorder.  She seems to be doing much better.  She is probably at as good a baseline as she is likely to be able to get to right now.  I am glad to hear that they may have found placement for her.  Supportive counseling.  Reviewed medicine.  No change to any psychiatric medicine at this point.  Patient although he is any monitoring of psychiatric symptoms and medications  chronically in order to remain stable.  Disposition: No evidence of imminent risk to self or others at present.   Patient does not meet criteria for psychiatric inpatient admission. Supportive therapy provided about ongoing stressors.  Alethia Berthold, MD 08/12/2017 3:45 PM

## 2017-08-12 NOTE — Progress Notes (Signed)
   08/12/17 1109  Clinical Encounter Type  Visited With Patient  Visit Type Follow-up   Chaplain greeted patient and listened to her concerns.  She stated she thinks they've located a place for her to go and requested prayer that she would be accepted at this new facility and that her son could visit her there.  Chaplain prayed with patient.

## 2017-08-12 NOTE — Progress Notes (Signed)
Physical Therapy Treatment Patient Details Name: [name] [name] MRN: [id number] DOB: August 09, 1945 Today's Date: 08/12/2017    History of Present Illness 72 y.o. female presenting to ED on 6/5 for paranoid episode, admitted to hospital 6/12 w/ acute on chronic renal disease stage 3, hypoxia secondary to pulmonary edema, and schizophrenia. PMH includes arthritis, bronchitis, CAD, CHF, chronic cough, COPD, GERD, and left ventricular hypertrophy.    PT Comments    Pt reports feeling good.  Slouching in chair with poor posture.  Stood with min guard and verbal cues for safety.  She was able to stand and complete marching and SLR x 10.  After seated rest she was able to progress ambulation to 140 with walker and min guard/assist.  She demonstrated decreased safety awareness and self awareness of fatigue.  SNF remains appropriate due to variable mobility, safety and balance.     Follow Up Recommendations  SNF     Equipment Recommendations       Recommendations for Other Services       Precautions / Restrictions Precautions Precautions: Fall Restrictions Weight Bearing Restrictions: No    Mobility  Bed Mobility               General bed mobility comments: Not tested; pt up in chair  Transfers Overall transfer level: Needs assistance Equipment used: Rolling walker (2 wheeled) Transfers: Sit to/from Stand Sit to Stand: Min guard         General transfer comment: verbal cues for hand placements  Ambulation/Gait Ambulation/Gait assistance: Min guard;Min assist Gait Distance (Feet): 140 Feet Assistive device: Rolling walker (2 wheeled) Gait Pattern/deviations: Step-through pattern;Decreased step length - right;Decreased step length - left;Trunk flexed   Gait velocity interpretation: <1.8 ft/sec, indicate of risk for recurrent falls     Stairs             Wheelchair Mobility    Modified Rankin (Stroke Patients Only)       Balance Overall balance  assessment: Needs assistance Sitting-balance support: Feet supported;Single extremity supported Sitting balance-Leahy Scale: Fair     Standing balance support: Bilateral upper extremity supported Standing balance-Leahy Scale: Fair Standing balance comment: Relies on walker but no LOB or buckling                            Cognition Arousal/Alertness: Awake/alert Behavior During Therapy: WFL for tasks assessed/performed Overall Cognitive Status: History of cognitive impairments - at baseline                                        Exercises      General Comments        Pertinent Vitals/Pain Pain Assessment: No/denies pain    Home Living                      Prior Function            PT Goals (current goals can now be found in the care plan section) Progress towards PT goals: Progressing toward goals    Frequency    Min 2X/week      PT Plan Current plan remains appropriate    Co-evaluation              AM-PAC PT "6 Clicks" Daily Activity  Outcome Measure  Difficulty turning over in bed (including adjusting bedclothes,  sheets and blankets)?: None Difficulty moving from lying on back to sitting on the side of the bed? : None Difficulty sitting down on and standing up from a chair with arms (e.g., wheelchair, bedside commode, etc,.)?: A Little Help needed moving to and from a bed to chair (including a wheelchair)?: A Little   Help needed climbing 3-5 steps with a railing? : A Lot 6 Click Score: 16    End of Session Equipment Utilized During Treatment: Gait belt   Patient left: in chair;with call bell/phone within reach;with chair alarm set         Time: 1215-1230 PT Time Calculation (min) (ACUTE ONLY): 15 min  Charges:  $Gait Training: 8-22 mins                    G Codes:       Chesley Noon, PTA 08/12/17, 12:40 PM

## 2017-08-13 DIAGNOSIS — F250 Schizoaffective disorder, bipolar type: Secondary | ICD-10-CM | POA: Diagnosis not present

## 2017-08-13 LAB — CBC
HCT: 37.3 % (ref 35.0–47.0)
Hemoglobin: 12.5 g/dL (ref 12.0–16.0)
MCH: 30.9 pg (ref 26.0–34.0)
MCHC: 33.4 g/dL (ref 32.0–36.0)
MCV: 92.6 fL (ref 80.0–100.0)
PLATELETS: 213 10*3/uL (ref 150–440)
RBC: 4.03 MIL/uL (ref 3.80–5.20)
RDW: 15.4 % — ABNORMAL HIGH (ref 11.5–14.5)
WBC: 7.7 10*3/uL (ref 3.6–11.0)

## 2017-08-13 LAB — GLUCOSE, CAPILLARY
Glucose-Capillary: 84 mg/dL (ref 65–99)
Glucose-Capillary: 126 mg/dL — ABNORMAL HIGH (ref 65–99)
Glucose-Capillary: 103 mg/dL — ABNORMAL HIGH (ref 65–99)
Glucose-Capillary: 164 mg/dL — ABNORMAL HIGH (ref 65–99)

## 2017-08-13 LAB — BASIC METABOLIC PANEL
Anion gap: 6 (ref 5–15)
BUN: 32 mg/dL — AB (ref 6–20)
CALCIUM: 8.8 mg/dL — AB (ref 8.9–10.3)
CO2: 31 mmol/L (ref 22–32)
Chloride: 101 mmol/L (ref 101–111)
Creatinine, Ser: 1.39 mg/dL — ABNORMAL HIGH (ref 0.44–1.00)
GFR calc Af Amer: 43 mL/min — ABNORMAL LOW (ref >60)
GFR, EST NON AFRICAN AMERICAN: 37 mL/min — AB (ref >60)
GLUCOSE: 92 mg/dL (ref 65–99)
Potassium: 5.4 mmol/L — ABNORMAL HIGH (ref 3.5–5.1)
Sodium: 138 mmol/L (ref 135–145)

## 2017-08-13 MED ORDER — SODIUM POLYSTYRENE SULFONATE 15 GM/60ML PO SUSP
30.0000 g | Freq: Once | ORAL | Status: AC
  Administered 2017-08-13: 08:00:00 30 g via ORAL
  Filled 2017-08-13: qty 120

## 2017-08-13 NOTE — Progress Notes (Signed)
Patient ID: [name] [name], female   DOB: 12/05/1945, 72 y.o.   MRN: [id number]  [facility] PROGRESS NOTE  [name] [name] DJS:[id number] DOB: Feb 17, 1946 DOA: 07/27/2017 PCP: Patient, No Pcp Per  HPI/Subjective: Patient feels okay.  Offers no complaints.  Objective: Vitals:   08/13/17 0434 08/13/17 0823  BP: 105/73 101/66  Pulse: (!) 54 62  Resp: 18   Temp: 97.7 F (36.5 C) 97.6 F (36.4 C)  SpO2: 97% 91%    Filed Weights   08/03/17 1629  Weight: 123.2 kg (271 lb 11.2 oz)    ROS: Review of Systems  Constitutional: Negative for chills and fever.  Eyes: Negative for blurred vision.  Respiratory: Negative for cough and shortness of breath.   Cardiovascular: Negative for chest pain.  Gastrointestinal: Negative for abdominal pain, constipation, diarrhea, nausea and vomiting.  Genitourinary: Negative for dysuria.  Musculoskeletal: Negative for joint pain.  Neurological: Negative for dizziness and headaches.   Exam: Physical Exam  Constitutional: She is oriented to person, place, and time.  HENT:  Nose: No mucosal edema.  Mouth/Throat: No oropharyngeal exudate or posterior oropharyngeal edema.  Eyes: Pupils are equal, round, and reactive to light. Conjunctivae, EOM and lids are normal.  Neck: No JVD present. Carotid bruit is not present. No edema present. No thyroid mass and no thyromegaly present.  Cardiovascular: S1 normal and S2 normal. Exam reveals no gallop.  No murmur heard. Pulses:      Dorsalis pedis pulses are 2+ on the right side, and 2+ on the left side.  Respiratory: No respiratory distress. She has no wheezes. She has no rhonchi. She has no rales.  GI: Soft. Bowel sounds are normal. There is no tenderness.  Musculoskeletal:       Right ankle: She exhibits swelling.       Left ankle: She exhibits swelling.  Lymphadenopathy:    She has no cervical adenopathy.  Neurological: She is alert and oriented to person, place, and time. No cranial nerve deficit.   Skin: Skin is warm. No rash noted. Nails show no clubbing.  Psychiatric: She has a normal mood and affect.      Data Reviewed: Basic Metabolic Panel: Recent Labs  Lab 08/07/17 0526 08/13/17 0459  NA 139 138  K 4.2 5.4*  CL 101 101  CO2 31 31  GLUCOSE 98 92  BUN 19 32*  CREATININE 1.12* 1.39*  CALCIUM 8.8* 8.8*   CBC: Recent Labs  Lab 08/09/17 0741 08/13/17 0459  WBC 7.8 7.7  HGB 13.4 12.5  HCT 40.9 37.3  MCV 90.6 92.6  PLT 263 213   BNP (last 3 results) Recent Labs    01/26/17 0000 05/02/17 1521  BNP 74.0 45.0     CBG: Recent Labs  Lab 08/12/17 1146 08/12/17 1642 08/12/17 2105 08/13/17 0740 08/13/17 1145  GLUCAP 210* 106* 127* 84 126*    Recent Results (from the past 240 hour(s))  MRSA PCR Screening     Status: None   Collection Time: 08/03/17  5:03 PM  Result Value Ref Range Status   MRSA by PCR NEGATIVE NEGATIVE Final    Comment:        The GeneXpert MRSA Assay (FDA approved for NASAL specimens only), is one component of a comprehensive MRSA colonization surveillance program. It is not intended to diagnose MRSA infection nor to guide or monitor treatment for MRSA infections. Performed at [facility], [address] [place], [place] [address]      Scheduled Meds: .  aspirin EC  81 mg Oral Daily  . atorvastatin  20 mg Oral QHS  . benztropine  0.5 mg Oral BID  . carvedilol  12.5 mg Oral BID  . cholecalciferol  1,000 Units Oral Daily  . divalproex  500 mg Oral TID  . docusate sodium  200 mg Oral Daily  . famotidine  20 mg Oral BID  . feeding supplement (PRO-STAT SUGAR FREE 64)  30 mL Oral BID  . fluPHENAZine  5 mg Oral TID  . fluPHENAZine decanoate  50 mg Intramuscular Q14 Days  . furosemide  20 mg Oral Daily  . glipiZIDE  10 mg Oral Daily  . heparin  5,000 Units Subcutaneous Q8H  . insulin aspart  0-15 Units Subcutaneous TID WC  . insulin aspart  0-5 Units Subcutaneous QHS  . insulin glargine  11 Units Subcutaneous  QHS  . levothyroxine  75 mcg Oral QAC breakfast  . loratadine  10 mg Oral Daily  . mometasone-formoterol  2 puff Inhalation BID  . multivitamin with minerals  1 tablet Oral Daily  . tiotropium  18 mcg Inhalation Daily   Continuous Infusions:  Assessment/Plan:  1. Hyperkalemia.  Hold ACE inhibitor.  Give a dose of Kayexalate today.  Repeat BMP tomorrow. 2. Acute kidney injury on chronic kidney disease stage III.  Continue to monitor. 3. Acute hypoxic respiratory failure improved.  Currently off oxygen. 4. Schizophrenia.  Continue psychiatric medications as per psychiatry. 5. Type 2 diabetes mellitus on Lantus sliding scale and glipizide 6. Hypothyroidism unspecified on Synthroid 7. Hypertension on lisinopril 8. Hyperlipidemia unspecified on atorvastatin 9. Morbid obesity weight loss needed   Code Status:     Code Status Orders  (From admission, onward)        Start     Ordered   08/03/17 1626  Full code  Continuous     08/03/17 1625    Code Status History    Date Active Date Inactive Code Status Order ID Comments User Context   07/02/2016 1403 07/03/2016 2031 Full Code [id number]  Charlesetta Shanks, MD ED   01/16/2016 0346 01/21/2016 1921 DNR [id number]  Lily Kocher, MD Inpatient   10/14/2015 1249 10/22/2015 2022 DNR [id number]  Laverle Hobby, MD Inpatient   10/13/2015 0201 10/14/2015 1249 Full Code [id number]  Mikael Spray, NP ED   10/29/2014 2043 11/05/2014 2003 Full Code [id number]  Gonzella Lex, MD Inpatient   02/09/202016 1936 08/12/2014 1635 Full Code [id number]  Sheryle Spray, RN ED   06/29/2014 1523 07/04/2014 0050 Full Code [id number]  Gladstone Lighter, MD Inpatient   06/24/2011 1832 07/01/2011 0002 Full Code [id number]  Kathie Dike, MD Inpatient    Advance Directive Documentation     Most Recent Value  Type of Advance Directive  Living will  Pre-existing out of facility DNR order (yellow form or pink MOST form)  -  "MOST" Form in Place?  -       Disposition Plan: Likely out to facility on Monday  Time spent: 25 minutes  [place]

## 2017-08-13 NOTE — Progress Notes (Signed)
   08/13/17 1857  Clinical Encounter Type  Visited With Patient  Visit Type Follow-up  Referral From Nurse  Consult/Referral To Chaplain  Spiritual Encounters  Spiritual Needs Emotional;Prayer   Chaplain responded to page from unit staff regarding patient support.  Upon arrival, patient presented as tearful.  Patient asked for tissues; chaplain could not locate any in room so went to ask NT who offered to bring them in.  Chaplain returned to patient who reached out to hold chaplain's hand as she cried.  Patient stated that she hadn't been happy 'since she was a baby.'  She shared thoughts and feelings regarding recent facility placement and uncertainty of where she was going next.  Patient expressed that she feels as though her children are tired of her.  Patient asked for prayer.  Chaplain wanted to know what patient wanted to pray for.  Patient replied to feel happy and for God to forgive her for past choices.  Chaplain led prayer for God's presence, love, and forgiveness.  Patient then moved into discussion of grandchildren who live in [place] with her son and daughter-in-law and began to be less tearful.  Tears did return when she began speaking of past choices again.  Chaplain and patient prayed together again regarding God's love and mercy.

## 2017-08-14 ENCOUNTER — Observation Stay: Payer: Medicare HMO

## 2017-08-14 DIAGNOSIS — F250 Schizoaffective disorder, bipolar type: Secondary | ICD-10-CM | POA: Diagnosis not present

## 2017-08-14 LAB — BLOOD GAS, ARTERIAL
Acid-Base Excess: 11.8 mmol/L — ABNORMAL HIGH (ref 0.0–2.0)
Bicarbonate: 39.6 mmol/L — ABNORMAL HIGH (ref 20.0–28.0)
FIO2: 0.21
O2 Saturation: 85 %
PCO2 ART: 67 mmHg — AB (ref 32.0–48.0)
PH ART: 7.38 (ref 7.350–7.450)
Patient temperature: 37
pO2, Arterial: 51 mmHg — ABNORMAL LOW (ref 83.0–108.0)

## 2017-08-14 LAB — BASIC METABOLIC PANEL
Anion gap: 6 (ref 5–15)
BUN: 25 mg/dL — AB (ref 6–20)
CALCIUM: 8.4 mg/dL — AB (ref 8.9–10.3)
CO2: 32 mmol/L (ref 22–32)
CREATININE: 1.03 mg/dL — AB (ref 0.44–1.00)
Chloride: 102 mmol/L (ref 101–111)
GFR calc Af Amer: >60 mL/min (ref >60)
GFR calc non Af Amer: 53 mL/min — ABNORMAL LOW (ref >60)
Glucose, Bld: 138 mg/dL — ABNORMAL HIGH (ref 65–99)
Potassium: 4.4 mmol/L (ref 3.5–5.1)
SODIUM: 140 mmol/L (ref 135–145)

## 2017-08-14 LAB — GLUCOSE, CAPILLARY
GLUCOSE-CAPILLARY: 108 mg/dL — AB (ref 65–99)
GLUCOSE-CAPILLARY: 148 mg/dL — AB (ref 65–99)
GLUCOSE-CAPILLARY: 158 mg/dL — AB (ref 65–99)
Glucose-Capillary: 143 mg/dL — ABNORMAL HIGH (ref 65–99)

## 2017-08-14 LAB — VALPROIC ACID LEVEL: VALPROIC ACID LVL: 78 ug/mL (ref 50.0–100.0)

## 2017-08-14 MED ORDER — CARVEDILOL 3.125 MG PO TABS
6.2500 mg | ORAL_TABLET | Freq: Two times a day (BID) | ORAL | Status: DC
  Administered 2017-08-14 – 2017-08-15 (×3): 6.25 mg via ORAL
  Filled 2017-08-14 (×3): qty 2

## 2017-08-14 MED ORDER — SODIUM CHLORIDE 0.9 % IV BOLUS
250.0000 mL | Freq: Once | INTRAVENOUS | Status: AC
  Administered 2017-08-14: 250 mL via INTRAVENOUS

## 2017-08-14 NOTE — Progress Notes (Signed)
[name] [name] ID: [name] [name], female   DOB: 12-Aug-1945, 72 y.o.   MRN: [id number]  [facility] PROGRESS NOTE  SHANTARA GOOSBY DUK:[id number] DOB: 31-Aug-1945 DOA: 07/27/2017 PCP: [name] [name], No Pcp Per  HPI/Subjective: [name] [name] stated that she does not feel good.  She was coughing a few times when I was in the room.  She went back to sleep pretty easily.  Objective: Vitals:   08/14/17 0509 08/14/17 1225  BP: 98/76   Pulse: 61 62  Resp: 19 18  Temp: 98.2 F (36.8 C)   SpO2: 98% 99%    Filed Weights   08/03/17 1629  Weight: 123.2 kg (271 lb 11.2 oz)    ROS: Review of Systems  Unable to perform ROS: Acuity of condition  Respiratory: Positive for cough.    Exam: Physical Exam  Constitutional: She appears lethargic.  HENT:  Nose: No mucosal edema.  Mouth/Throat: No oropharyngeal exudate or posterior oropharyngeal edema.  Eyes: Pupils are equal, round, and reactive to light. Conjunctivae, EOM and lids are normal.  Neck: No JVD present. Carotid bruit is not present. No edema present. No thyroid mass and no thyromegaly present.  Cardiovascular: S1 normal and S2 normal. Exam reveals no gallop.  No murmur heard. Pulses:      Dorsalis pedis pulses are 2+ on the right side, and 2+ on the left side.  Respiratory: No respiratory distress. She has decreased breath sounds in the right lower field and the left lower field. She has no wheezes. She has no rhonchi. She has no rales.  GI: Soft. Bowel sounds are normal. There is no tenderness.  Musculoskeletal:       Right ankle: She exhibits swelling.       Left ankle: She exhibits swelling.  Lymphadenopathy:    She has no cervical adenopathy.  Neurological: She appears lethargic. No cranial nerve deficit.  Skin: Skin is warm. No rash noted. Nails show no clubbing.  Psychiatric:  [name] [name] lethargic today.      Data Reviewed: Basic Metabolic Panel: Recent Labs  Lab 08/13/17 0459 08/14/17 0453  NA 138 140  K 5.4* 4.4  CL 101 102   CO2 31 32  GLUCOSE 92 138*  BUN 32* 25*  CREATININE 1.39* 1.03*  CALCIUM 8.8* 8.4*   CBC: Recent Labs  Lab 08/09/17 0741 08/13/17 0459  WBC 7.8 7.7  HGB 13.4 12.5  HCT 40.9 37.3  MCV 90.6 92.6  PLT 263 213   BNP (last 3 results) Recent Labs    01/26/17 0000 05/02/17 1521  BNP 74.0 45.0     CBG: Recent Labs  Lab 08/13/17 1145 08/13/17 1709 08/13/17 2117 08/14/17 0730 08/14/17 1144  GLUCAP 126* 103* 164* 108* 148*      Scheduled Meds: . aspirin EC  81 mg Oral Daily  . atorvastatin  20 mg Oral QHS  . benztropine  0.5 mg Oral BID  . carvedilol  12.5 mg Oral BID  . cholecalciferol  1,000 Units Oral Daily  . divalproex  500 mg Oral TID  . docusate sodium  200 mg Oral Daily  . famotidine  20 mg Oral BID  . feeding supplement (PRO-STAT SUGAR FREE 64)  30 mL Oral BID  . fluPHENAZine  5 mg Oral TID  . fluPHENAZine decanoate  50 mg Intramuscular Q14 Days  . furosemide  20 mg Oral Daily  . glipiZIDE  10 mg Oral Daily  . heparin  5,000 Units Subcutaneous Q8H  . insulin aspart  0-15 Units Subcutaneous  TID WC  . insulin aspart  0-5 Units Subcutaneous QHS  . insulin glargine  11 Units Subcutaneous QHS  . levothyroxine  75 mcg Oral QAC breakfast  . loratadine  10 mg Oral Daily  . mometasone-formoterol  2 puff Inhalation BID  . multivitamin with minerals  1 tablet Oral Daily  . tiotropium  18 mcg Inhalation Daily   Continuous Infusions:  Assessment/Plan:  1. Lethargy.  Could be secondary to acute hypoxic respiratory failure.  Also hypercarbic.  [name] [name] has a history of sleep apnea.  Put on CPAP.  Case discussed with respiratory.  Chest x-ray ordered. 2. Relative hypotension.  Fluid bolus x1. 3. Hyperkalemia.  Hold ACE inhibitor.  Potassium improved today. 4. Acute kidney injury on chronic kidney disease stage III.  Continue to monitor. 5. Acute hypoxic respiratory failure.   may end up needing oxygen again. 6. Schizophrenia.  Continue psychiatric medications as  per psychiatry. 7. Type 2 diabetes mellitus on Lantus sliding scale and glipizide 8. Hypothyroidism unspecified on Synthroid 9. Hypertension on  Coreg. 10. Hyperlipidemia unspecified on atorvastatin 11. Morbid obesity weight loss needed   Code Status:     Code Status Orders  (From admission, onward)        Start     Ordered   08/03/17 1626  Full code  Continuous     08/03/17 1625    Code Status History    Date Active Date Inactive Code Status Order ID Comments User Context   07/02/2016 1403 07/03/2016 2031 Full Code [id number]  Charlesetta Shanks, MD ED   01/16/2016 0346 01/21/2016 1921 DNR [id number]  Lily Kocher, MD Inpatient   10/14/2015 1249 10/22/2015 2022 DNR [id number]  Laverle Hobby, MD Inpatient   10/13/2015 0201 10/14/2015 1249 Full Code [id number]  Mikael Spray, NP ED   10/29/2014 2043 11/05/2014 2003 Full Code [id number]  Gonzella Lex, MD Inpatient   06/20/202016 1936 08/12/2014 1635 Full Code [id number]  Sheryle Spray, RN ED   06/29/2014 1523 07/04/2014 0050 Full Code [id number]  Gladstone Lighter, MD Inpatient   06/24/2011 1832 07/01/2011 0002 Full Code [id number]  Kathie Dike, MD Inpatient    Advance Directive Documentation     Most Recent Value  Type of Advance Directive  Living will  Pre-existing out of facility DNR order (yellow form or pink MOST form)  -  "MOST" Form in Place?  -      Disposition Plan:  to be determined.  Case discussed with son on the phone.  Time spent: 25 minutes  [place]

## 2017-08-14 NOTE — Progress Notes (Signed)
Pt placed on [facility] C-5 CPAP. CPAP plugged into red outlet

## 2017-08-14 NOTE — Progress Notes (Addendum)
Chest x-ray reviewed which has revealed mild subsegmental atelectasis No new interventions needed at this time based on x-ray results

## 2017-08-15 DIAGNOSIS — F250 Schizoaffective disorder, bipolar type: Secondary | ICD-10-CM | POA: Diagnosis not present

## 2017-08-15 LAB — BASIC METABOLIC PANEL
Anion gap: 8 (ref 5–15)
BUN: 27 mg/dL — AB (ref 6–20)
CALCIUM: 8.4 mg/dL — AB (ref 8.9–10.3)
CO2: 31 mmol/L (ref 22–32)
CREATININE: 1.08 mg/dL — AB (ref 0.44–1.00)
Chloride: 100 mmol/L — ABNORMAL LOW (ref 101–111)
GFR calc non Af Amer: 50 mL/min — ABNORMAL LOW (ref >60)
GFR, EST AFRICAN AMERICAN: 58 mL/min — AB (ref >60)
Glucose, Bld: 86 mg/dL (ref 65–99)
Potassium: 4.8 mmol/L (ref 3.5–5.1)
SODIUM: 139 mmol/L (ref 135–145)

## 2017-08-15 LAB — CBC
HCT: 38.1 % (ref 35.0–47.0)
Hemoglobin: 12.5 g/dL (ref 12.0–16.0)
MCH: 30.3 pg (ref 26.0–34.0)
MCHC: 32.9 g/dL (ref 32.0–36.0)
MCV: 92.2 fL (ref 80.0–100.0)
Platelets: 201 10*3/uL (ref 150–440)
RBC: 4.13 MIL/uL (ref 3.80–5.20)
RDW: 15.2 % — AB (ref 11.5–14.5)
WBC: 7 10*3/uL (ref 3.6–11.0)

## 2017-08-15 LAB — GLUCOSE, CAPILLARY
GLUCOSE-CAPILLARY: 80 mg/dL (ref 65–99)
GLUCOSE-CAPILLARY: 193 mg/dL — AB (ref 65–99)
Glucose-Capillary: 89 mg/dL (ref 65–99)
Glucose-Capillary: 135 mg/dL — ABNORMAL HIGH (ref 65–99)

## 2017-08-15 MED ORDER — DIVALPROEX SODIUM 500 MG PO DR TAB: 500.0000 mg | DELAYED_RELEASE_TABLET | Freq: Three times a day (TID) | ORAL | 0 refills | Status: AC

## 2017-08-15 MED ORDER — POLYETHYLENE GLYCOL 3350 17 G PO PACK: 17.0000 g | PACK | Freq: Every day | ORAL | 0 refills | Status: DC | PRN

## 2017-08-15 MED ORDER — CARVEDILOL 6.25 MG PO TABS: 6.2500 mg | ORAL_TABLET | Freq: Two times a day (BID) | ORAL | 0 refills | Status: DC

## 2017-08-15 MED ORDER — INSULIN ASPART 100 UNIT/ML ~~LOC~~ SOLN: 4.0000 [IU] | Freq: Three times a day (TID) | SUBCUTANEOUS | 0 refills | Status: DC

## 2017-08-15 MED ORDER — FUROSEMIDE 20 MG PO TABS: 20.0000 mg | ORAL_TABLET | Freq: Every day | ORAL | 0 refills | Status: DC

## 2017-08-15 MED ORDER — FLUPHENAZINE DECANOATE 25 MG/ML IJ SOLN: 50.0000 mg | INTRAMUSCULAR | 0 refills | Status: DC

## 2017-08-15 NOTE — Discharge Summary (Addendum)
[facility] NAME: [name] [name]    MR#:  [id number]  DATE OF BIRTH:  10-Jun-1945  DATE OF ADMISSION:  07/27/2017 ADMITTING PHYSICIAN: Gladstone Lighter, MD  DATE OF DISCHARGE: 08/16/2017  PRIMARY CARE PHYSICIAN: Patient, No Pcp Per    ADMISSION DIAGNOSIS:  Paranoia ([place]) [F22] Acute renal insufficiency [N28.9] Hypoxia [R09.02] Schizophrenia, unspecified type ([place]) [F20.9]  DISCHARGE DIAGNOSIS:  Principal Problem:   Schizoaffective disorder, bipolar type ([place]) Active Problems:   ARF (acute renal failure) ([place])   Acute renal failure (ARF) ([place])   SECONDARY DIAGNOSIS:   Past Medical History:  Diagnosis Date  . Anxiety   . Arthritis   . Bronchitis   . Bursitis   . CAD (coronary artery disease)   . CHF (congestive heart failure) ([place])   . Chronic cough   . Chronic kidney disease    shadow on x-ray  . COPD (chronic obstructive pulmonary disease) ([place])   . Depression   . DM2 (diabetes mellitus, type 2) ([place])   . Environmental allergies   . GERD (gastroesophageal reflux disease)   . Hypercholesteremia   . Hypertension   . Left ventricular outflow tract obstruction    a. echo 03/2014: EF 60-65%, hypernamic LV systolic fxn, mod LVH w/ LVOT gradient estimated at 68 mm Hg w/ valsalva, very small LV internal cavity size, mildly increased LV posterior wall thickness, mild Ao valve scl w/o stenosis, diastolic dysfunction, normal RVSP  . Lower extremity edema   . LVH (left ventricular hypertrophy)    a. echo suggests long standing uncontrolled htn. she will not do well when dehydrated, LV cavity obliteration  . Muscle weakness   . Obesity   . On supplemental oxygen therapy    AS NEEDED  . OSA (obstructive sleep apnea)    does not use machine  . Osteoarthritis   . Schizophrenia ([place])   . Tremors of nervous system   . Wheezing     HOSPITAL COURSE:   1.  Acute kidney disease on chronic kidney disease stage III.  The patient  was given IV fluids during the hospital stay.  Creatinine was 2.07 on presentation and came down to 1.08. 2.  Hyperkalemia during the hospital course.  This has improved with holding ACE inhibitor and a dose of Kayexalate. 3.  Acute hypoxic respiratory failure this has improved.  This has improved during the day and is off oxygen during the day.   4.  Sleep apnea.  CPAP at night.  If you are unable to do his CPAP at night must be able to give 2 L of oxygen nightly.  Pulse ox by me while she was sleeping this morning was ranging between 84% and 89%.  Patient qualifies for night time oxygen if unable to do cpap automated at night. 5.  Type 2 diabetes mellitus on Lantus and short acting insulin prior to meals and glipizide 6.  Hypothyroidism unspecified on Synthroid 7.  Essential hypertension on Coreg 8.  Hyperlipidemia unspecified on atorvastatin 9.  Morbid obesity.  Weight loss needed 10.  Schizoaffective disorder.  Patient seen in consultation by Dr. Weber Cooks psychiatry.  He made some adjustments in medications and those were prescribed upon discharge out of the hospital.  Continue psychiatric follow-up as outpatient.   DISCHARGE CONDITIONS:   Satisfactory  CONSULTS OBTAINED:  Treatment Team:  Clapacs, Madie Reno, MD  DRUG ALLERGIES:   Allergies  Allergen Reactions  . Haldol [Haloperidol Decanoate] Swelling and Other (See  Comments)    Reaction:  Swelling of tongue and blurred vision    . Metformin Diarrhea  . Prednisone Other (See Comments)    Unknown reaction  . Raspberry Swelling and Other (See Comments)    Reaction:  Swelling of lips     DISCHARGE MEDICATIONS:   Allergies as of 08/16/2017      Reactions   Haldol [haloperidol Decanoate] Swelling, Other (See Comments)   Reaction:  Swelling of tongue and blurred vision     Metformin Diarrhea   Prednisone Other (See Comments)   Unknown reaction   Raspberry Swelling, Other (See Comments)   Reaction:  Swelling of lips        Medication List    STOP taking these medications   amLODipine 10 MG tablet Commonly known as:  NORVASC   cephALEXin 500 MG capsule Commonly known as:  KEFLEX   dextromethorphan-guaiFENesin 10-100 MG/5ML liquid Commonly known as:  ROBITUSSIN-DM   dicyclomine 20 MG tablet Commonly known as:  BENTYL   fluPHENAZine Decanoate Liqd   hydrALAZINE 50 MG tablet Commonly known as:  APRESOLINE   JANUVIA 100 MG tablet Generic drug:  sitaGLIPtin   lisinopril 40 MG tablet Commonly known as:  PRINIVIL,ZESTRIL   LORazepam 0.5 MG tablet Commonly known as:  ATIVAN   metoCLOPramide 10 MG tablet Commonly known as:  REGLAN   NON FORMULARY   potassium chloride SA 20 MEQ tablet Commonly known as:  K-DUR,KLOR-CON   traZODone 100 MG tablet Commonly known as:  DESYREL     TAKE these medications   acetaminophen 325 MG tablet Commonly known as:  TYLENOL Take 2 tablets (650 mg total) by mouth every 4 (four) hours as needed for mild pain (temp > 101.5).   albuterol 108 (90 Base) MCG/ACT inhaler Commonly known as:  PROVENTIL HFA;VENTOLIN HFA Inhale 2 puffs into the lungs every 6 (six) hours as needed for wheezing or shortness of breath.   aspirin EC 81 MG tablet Take 81 mg by mouth daily.   atorvastatin 20 MG tablet Commonly known as:  LIPITOR Take 20 mg by mouth at bedtime.   benztropine 0.5 MG tablet Commonly known as:  COGENTIN Take 0.5 mg by mouth 2 (two) times daily.   carvedilol 6.25 MG tablet Commonly known as:  COREG Take 1 tablet (6.25 mg total) by mouth 2 (two) times daily. What changed:    medication strength  how much to take   cetirizine 10 MG tablet Commonly known as:  ZYRTEC Take 10 mg by mouth daily.   cholecalciferol 1000 units tablet Commonly known as:  VITAMIN D Take 1,000 Units by mouth daily.   divalproex 500 MG DR tablet Commonly known as:  DEPAKOTE Take 1 tablet (500 mg total) by mouth 3 (three) times daily.   docusate sodium 100 MG  capsule Commonly known as:  COLACE Take 200 mg by mouth daily.   eucerin cream Apply 1 application topically daily.   feeding supplement (PRO-STAT SUGAR FREE 64) Liqd Take 30 mLs by mouth 2 (two) times daily.   fluPHENAZine 5 MG/ML solution Commonly known as:  PROLIXIN Take 5 mg by mouth 3 (three) times daily.   fluPHENAZine decanoate 25 MG/ML injection Commonly known as:  PROLIXIN Inject 2 mLs (50 mg total) into the muscle every 14 (fourteen) days. Start taking on:  08/25/2017   Fluticasone-Salmeterol 250-50 MCG/DOSE Aepb Commonly known as:  ADVAIR Inhale 1 puff into the lungs 2 (two) times daily.   furosemide 20 MG tablet Commonly known  as:  LASIX Take 1 tablet (20 mg total) by mouth daily. What changed:    medication strength  how much to take   glipiZIDE 10 MG tablet Commonly known as:  GLUCOTROL Take 1 tablet by mouth daily.   guaiFENesin 100 MG/5ML Soln Commonly known as:  ROBITUSSIN Take 15 mLs by mouth every 6 (six) hours as needed for cough or to loosen phlegm. *Notify physician if cough persists more than 3 days*   insulin aspart 100 UNIT/ML injection Commonly known as:  novoLOG Inject 4 Units into the skin 3 (three) times daily before meals. What changed:    how much to take  when to take this   insulin glargine 100 UNIT/ML injection Commonly known as:  LANTUS Inject 11 Units into the skin at bedtime.   levothyroxine 75 MCG tablet Commonly known as:  SYNTHROID, LEVOTHROID Take 1 tablet by mouth daily.   magnesium hydroxide 400 MG/5ML suspension Commonly known as:  MILK OF MAGNESIA Take 30 mLs by mouth daily as needed for mild constipation.   multivitamin with minerals Tabs tablet Take 1 tablet by mouth daily.   nitroGLYCERIN 0.4 MG SL tablet Commonly known as:  NITROSTAT Place 0.4 mg under the tongue every 5 (five) minutes as needed for chest pain.   polyethylene glycol packet Commonly known as:  MIRALAX / GLYCOLAX Take 17 g by mouth  daily as needed for mild constipation.   ranitidine 150 MG tablet Commonly known as:  ZANTAC Take 150 mg by mouth 2 (two) times daily.   tiotropium 18 MCG inhalation capsule Commonly known as:  SPIRIVA Place 1 capsule (18 mcg total) into inhaler and inhale daily.        DISCHARGE INSTRUCTIONS:   Follow-up with Dr. rehab 1 day  If you experience worsening of your admission symptoms, develop shortness of breath, life threatening emergency, suicidal or homicidal thoughts you must seek medical attention immediately by calling 911 or calling your MD immediately  if symptoms less severe.  You Must read complete instructions/literature along with all the possible adverse reactions/side effects for all the Medicines you take and that have been prescribed to you. Take any new Medicines after you have completely understood and accept all the possible adverse reactions/side effects.   Please note  You were cared for by a hospitalist during your hospital stay. If you have any questions about your discharge medications or the care you received while you were in the hospital after you are discharged, you can call the unit and asked to speak with the hospitalist on call if the hospitalist that took care of you is not available. Once you are discharged, your primary care physician will handle any further medical issues. Please note that NO REFILLS for any discharge medications will be authorized once you are discharged, as it is imperative that you return to your primary care physician (or establish a relationship with a primary care physician if you do not have one) for your aftercare needs so that they can reassess your need for medications and monitor your lab values.    Today   CHIEF COMPLAINT:   Chief Complaint  Patient presents with  . Hallucinations    HISTORY OF PRESENT ILLNESS:  Fiona Coto  is a 72 y.o. female with a known history of schizoaffective disorder presented with  hallucinations and found to have acute kidney injury.   VITAL SIGNS:  Blood pressure (!) 147/81, pulse 63, temperature (!) 97.4 F (36.3 C), temperature source Oral, resp.  rate 17, height 5\' 6"  (1.676 m), weight 123.2 kg (271 lb 11.2 oz), SpO2 93 %.   PHYSICAL EXAMINATION:  GENERAL:  72 y.o.-year-old patient lying in the bed with no acute distress.  EYES: Pupils equal, round, reactive to light and accommodation. No scleral icterus. Extraocular muscles intact.  HEENT: Head atraumatic, normocephalic. Oropharynx and nasopharynx clear.  NECK:  Supple, no jugular venous distention. No thyroid enlargement, no tenderness.  LUNGS: Normal breath sounds bilaterally, no wheezing, rales,rhonchi or crepitation. No use of accessory muscles of respiration.  CARDIOVASCULAR: S1, S2 normal. No murmurs, rubs, or gallops.  ABDOMEN: Soft, non-tender, non-distended. Bowel sounds present. No organomegaly or mass.  EXTREMITIES: 3+ pedal edema no.  cyanosis, or clubbing.  NEUROLOGIC: Cranial nerves II through XII are intact. Muscle strength 4/5 in all extremities. Sensation intact. Gait not checked.  PSYCHIATRIC: The patient is alert and  answers questions.  SKIN: No obvious rash, lesion, or ulcer.   DATA REVIEW:   CBC Recent Labs  Lab 08/15/17 0352  WBC 7.0  HGB 12.5  HCT 38.1  PLT 201    Chemistries  Recent Labs  Lab 08/15/17 0352  NA 139  K 4.8  CL 100*  CO2 31  GLUCOSE 86  BUN 27*  CREATININE 1.08*  CALCIUM 8.4*     Microbiology Results  Results for orders placed or performed during the hospital encounter of 07/27/17  Urine culture     Status: Abnormal   Collection Time: 07/27/17  6:54 PM  Result Value Ref Range Status   Specimen Description   Final    URINE, RANDOM Performed at [facility], [address], [place], [place] [address]    Special Requests   Final    NONE Performed at [facility], [address], [place], [place] [address]    Culture  MULTIPLE SPECIES PRESENT, SUGGEST RECOLLECTION (A)  Final   Report Status 07/29/2017 FINAL  Final  MRSA PCR Screening     Status: None   Collection Time: 08/03/17  5:03 PM  Result Value Ref Range Status   MRSA by PCR NEGATIVE NEGATIVE Final    Comment:        The GeneXpert MRSA Assay (FDA approved for NASAL specimens only), is one component of a comprehensive MRSA colonization surveillance program. It is not intended to diagnose MRSA infection nor to guide or monitor treatment for MRSA infections. Performed at [facility], [address], [place], [place] [address]     RADIOLOGY:  Dg Chest Port 1 View  Result Date: 08/14/2017 CLINICAL DATA:  Cough. EXAM: PORTABLE CHEST 1 VIEW COMPARISON:  Radiographs of August 04, 2017. FINDINGS: Stable cardiomediastinal silhouette. No pneumothorax or pleural effusion is noted. Mild bibasilar subsegmental atelectasis is noted. The visualized skeletal structures are unremarkable. IMPRESSION: Mild bibasilar subsegmental atelectasis. Electronically Signed   By: Marijo Conception, M.D.   On: 08/14/2017 17:21     Management plans discussed with the patient, and she is in agreement.  Spoke with son on the phone this am with plan.  CODE STATUS:     Code Status Orders  (From admission, onward)        Start     Ordered   08/03/17 1626  Full code  Continuous     08/03/17 1625    Code Status History    Date Active Date Inactive Code Status Order ID Comments User Context   07/02/2016 1403 07/03/2016 2031 Full Code [id number]  Charlesetta Shanks, MD ED  01/16/2016 0346 01/21/2016 1921 DNR [id number]  Lily Kocher, MD Inpatient   10/14/2015 1249 10/22/2015 2022 DNR [id number]  Laverle Hobby, MD Inpatient   10/13/2015 0201 10/14/2015 1249 Full Code [id number]  Mikael Spray, NP ED   10/29/2014 2043 11/05/2014 2003 Full Code [id number]  Gonzella Lex, MD Inpatient   02-19-202016 1936 08/12/2014 1635 Full Code [id number]  Sheryle Spray, RN ED    06/29/2014 1523 07/04/2014 0050 Full Code [id number]  Gladstone Lighter, MD Inpatient   06/24/2011 1832 07/01/2011 0002 Full Code [id number]  Kathie Dike, MD Inpatient    Advance Directive Documentation     Most Recent Value  Type of Advance Directive  Living will  Pre-existing out of facility DNR order (yellow form or pink MOST form)  -  "MOST" Form in Place?  -      TOTAL TIME TAKING CARE OF THIS PATIENT: 35 minutes.    Loletha Grayer M.D on 08/16/2017 at 9:09 AM  Between 7am to 6pm - Pager - [phone]  After 6pm go to [email or web address] - password [email or web address] [facility]  [employer] [facility] Office  [phone]  CC: Primary care physician; Patient, No Pcp Per

## 2017-08-15 NOTE — Progress Notes (Signed)
Patient ID: MARRISSA DAI, female   DOB: [birth year], 72 y.o.   MRN: [id number]   [employer] PROGRESS NOTE  GAILA ENGEBRETSEN ZSW:[id number] DOB: Jan 11, 1946 DOA: 07/27/2017 PCP: Patient, No Pcp Per  HPI/Subjective: Patient feeling better.  Offers no complaints.  Still waiting insurance authorization to go out to facility.  Objective: Vitals:   08/15/17 0845 08/15/17 0846  BP: (!) 144/85   Pulse: 63 (!) 59  Resp: (!) 22   Temp: 98 F (36.7 C)   SpO2:  98%    Filed Weights   08/03/17 1629  Weight: 123.2 kg (271 lb 11.2 oz)    ROS: Review of Systems  Unable to perform ROS: Acuity of condition  Respiratory: Negative for cough.   Cardiovascular: Negative for chest pain.  Gastrointestinal: Negative for abdominal pain.  Musculoskeletal: Negative for joint pain.   Exam: Physical Exam  HENT:  Nose: No mucosal edema.  Mouth/Throat: No oropharyngeal exudate or posterior oropharyngeal edema.  Eyes: Pupils are equal, round, and reactive to light. Conjunctivae, EOM and lids are normal.  Neck: No JVD present. Carotid bruit is not present. No edema present. No thyroid mass and no thyromegaly present.  Cardiovascular: S1 normal and S2 normal. Exam reveals no gallop.  No murmur heard. Pulses:      Dorsalis pedis pulses are 2+ on the right side, and 2+ on the left side.  Respiratory: No respiratory distress. She has decreased breath sounds in the right lower field and the left lower field. She has no wheezes. She has no rhonchi. She has no rales.  GI: Soft. Bowel sounds are normal. There is no tenderness.  Musculoskeletal:       Right ankle: She exhibits swelling.       Left ankle: She exhibits swelling.  Lymphadenopathy:    She has no cervical adenopathy.  Neurological: She is alert. No cranial nerve deficit.  Skin: Skin is warm. No rash noted. Nails show no clubbing.  Psychiatric:  Answers some questions.      Data Reviewed: Basic Metabolic Panel: Recent Labs  Lab  08/13/17 0459 08/14/17 0453 08/15/17 0352  NA 138 140 139  K 5.4* 4.4 4.8  CL 101 102 100*  CO2 31 32 31  GLUCOSE 92 138* 86  BUN 32* 25* 27*  CREATININE 1.39* 1.03* 1.08*  CALCIUM 8.8* 8.4* 8.4*   CBC: Recent Labs  Lab 08/09/17 0741 08/13/17 0459 08/15/17 0352  WBC 7.8 7.7 7.0  HGB 13.4 12.5 12.5  HCT 40.9 37.3 38.1  MCV 90.6 92.6 92.2  PLT 263 213 201   BNP (last 3 results) Recent Labs    01/26/17 0000 05/02/17 1521  BNP 74.0 45.0     CBG: Recent Labs  Lab 08/14/17 1144 08/14/17 1652 08/14/17 2052 08/15/17 0736 08/15/17 1222  GLUCAP 148* 143* 158* 80 193*      Scheduled Meds: . aspirin EC  81 mg Oral Daily  . atorvastatin  20 mg Oral QHS  . benztropine  0.5 mg Oral BID  . carvedilol  6.25 mg Oral BID  . cholecalciferol  1,000 Units Oral Daily  . divalproex  500 mg Oral TID  . docusate sodium  200 mg Oral Daily  . famotidine  20 mg Oral BID  . feeding supplement (PRO-STAT SUGAR FREE 64)  30 mL Oral BID  . fluPHENAZine  5 mg Oral TID  . fluPHENAZine decanoate  50 mg Intramuscular Q14 Days  . furosemide  20 mg Oral Daily  .  glipiZIDE  10 mg Oral Daily  . heparin  5,000 Units Subcutaneous Q8H  . insulin aspart  0-15 Units Subcutaneous TID WC  . insulin aspart  0-5 Units Subcutaneous QHS  . insulin glargine  11 Units Subcutaneous QHS  . levothyroxine  75 mcg Oral QAC breakfast  . loratadine  10 mg Oral Daily  . mometasone-formoterol  2 puff Inhalation BID  . multivitamin with minerals  1 tablet Oral Daily  . tiotropium  18 mcg Inhalation Daily   Continuous Infusions:  Assessment/Plan:  1. Acute kidney injury on chronic kidney disease stage III.  Improved during the hospital course. 2. Hyperkalemia improved with holding ACE inhibitor and a dose of Kayexalate 3. Acute hypoxic respiratory failure.   the patient has sleep apnea and desaturates at night while sleeping.  CPAP while sleeping.  If unable to do sleep apnea must be able to give 2 L of  oxygen at night. 4. Type 2 diabetes mellitus on Lantus sliding scale and glipizide 5. Hypothyroidism unspecified on Synthroid 6. Hypertension on  Coreg. 7. Hyperlipidemia unspecified on atorvastatin 8. Morbid obesity weight loss needed 9. Schizoaffective disorder.  Continue medications as per psychiatry   Code Status:     Code Status Orders  (From admission, onward)        Start     Ordered   08/03/17 1626  Full code  Continuous     08/03/17 1625    Code Status History    Date Active Date Inactive Code Status Order ID Comments User Context   07/02/2016 1403 07/03/2016 2031 Full Code [id number]  Charlesetta Shanks, MD ED   01/16/2016 0346 01/21/2016 1921 DNR [id number]  Lily Kocher, MD Inpatient   10/14/2015 1249 10/22/2015 2022 DNR [id number]  Laverle Hobby, MD Inpatient   10/13/2015 0201 10/14/2015 1249 Full Code [id number]  Mikael Spray, NP ED   10/29/2014 2043 11/05/2014 2003 Full Code [id number]  Gonzella Lex, MD Inpatient   02/12/202016 1936 08/12/2014 1635 Full Code [id number]  Sheryle Spray, RN ED   06/29/2014 1523 07/04/2014 0050 Full Code [id number]  Gladstone Lighter, MD Inpatient   06/24/2011 1832 07/01/2011 0002 Full Code [id number]  Kathie Dike, MD Inpatient    Advance Directive Documentation     Most Recent Value  Type of Advance Directive  Living will  Pre-existing out of facility DNR order (yellow form or pink MOST form)  -  "MOST" Form in Place?  -      Disposition Plan: Once insurance authorized,  out to facility.  This will be an avoidable hospital day because insurance company has not improved the patient to go.   Time spent: 40 minutes  [place]

## 2017-08-15 NOTE — Progress Notes (Signed)
Physical Therapy Treatment Patient Details Name: [name] [name] MRN: [id number] DOB: [birth year] Today's Date: 08/15/2017    History of Present Illness 72 y.o. female presenting to ED on 6/5 for paranoid episode, admitted to hospital 6/12 w/ acute on chronic renal disease stage 3, hypoxia secondary to pulmonary edema, and schizophrenia. PMH includes arthritis, bronchitis, CAD, CHF, chronic cough, COPD, GERD, and left ventricular hypertrophy.    PT Comments    Upon PT arriving to room (pt resting in bed), pt's O2 sats 85% on room air but once pt woken up more pt's O2 sats 92% or greater on room air rest of session (nursing notified regarding pt's O2 sats).  Pt requiring assist for sitting and standing balance (posterior lean noted).  Improved standing ability noted with decreased assist levels with repetition (progressed from max assist to CGA to min assist).  Able to ambulate 80 feet with RW CGA to min assist to steady (limited distance d/t fatigue).  Continue to recommend STR to increase strengthening, balance, and progressing independence with functional mobility.     Follow Up Recommendations  SNF     Equipment Recommendations  Rolling walker with 5" wheels    Recommendations for Other Services OT consult     Precautions / Restrictions Precautions Precautions: Fall Restrictions Weight Bearing Restrictions: No    Mobility  Bed Mobility Overal bed mobility: Needs Assistance Bed Mobility: Supine to Sit     Supine to sit: Mod assist;HOB elevated     General bed mobility comments: assist for trunk supine to sit; vc's for technique  Transfers Overall transfer level: Needs assistance Equipment used: Rolling walker (2 wheeled) Transfers: Sit to/from Stand Sit to Stand: Max assist;Mod assist;Min assist;Min guard         General transfer comment: x2 trials sit to stand from bed (1st attempt max assist to stand with posterior lean noted; 2nd attempt mod assist to stand with  posterior lean noted requiring vc's and assist to shift weight forward); x4 trials from bed (CGA to min assist to stand; intermittent posterior lean noted requiring vc's and assist to correct)  Ambulation/Gait Ambulation/Gait assistance: Min guard;Min assist Gait Distance (Feet): 80 Feet Assistive device: Rolling walker (2 wheeled)   Gait velocity: decreased   General Gait Details: partial B step length;  increased L LE external rotation compared to R LE noted; intermittent vc's to stay closer to RW required (when pt not standing close to RW, mild L knee flexion noted during L LE stance phase but improved when pt standing appropriately within RW); limited distance d/t fatigue   Stairs             Wheelchair Mobility    Modified Rankin (Stroke Patients Only)       Balance Overall balance assessment: Needs assistance Sitting-balance support: Feet supported;Bilateral upper extremity supported Sitting balance-Leahy Scale: Poor Sitting balance - Comments: intermittent posterior lean requiring CGA to mod assist to regain balance; intermittent vc's and tactile cues to lean forward required; sat on edge of bed about 10 minutes with focus on sitting balance Postural control: Posterior lean Standing balance support: Bilateral upper extremity supported Standing balance-Leahy Scale: Poor Standing balance comment: requires B UE support on RW for balance; initial posterior lean noted requiring vc's and assist to correct                            Cognition Arousal/Alertness: Awake/alert Behavior During Therapy: [facility] for tasks assessed/performed Overall  Cognitive Status: No family/caregiver present to determine baseline cognitive functioning                                 General Comments: Increased time to respond to therapist verbally and to initiate movement      Exercises General Exercises - Lower Extremity Long Arc Quad: AROM;Strengthening;Both;15  reps;Seated Hip Flexion/Marching: AROM;Strengthening;Both;10 reps;Seated    General Comments   Nursing cleared pt for participation in physical therapy.  Pt agreeable to PT session.      Pertinent Vitals/Pain Pain Assessment: No/denies pain Pain Intervention(s): Limited activity within patient's tolerance;Monitored during session;Repositioned  HR WFL during session.    Home Living                      Prior Function            PT Goals (current goals can now be found in the care plan section) Acute Rehab PT Goals Patient Stated Goal: to walk PT Goal Formulation: With patient Time For Goal Achievement: 08/19/17 Potential to Achieve Goals: Good Progress towards PT goals: Progressing toward goals    Frequency    Min 2X/week      PT Plan Current plan remains appropriate    Co-evaluation              AM-PAC PT "6 Clicks" Daily Activity  Outcome Measure  Difficulty turning over in bed (including adjusting bedclothes, sheets and blankets)?: A Little Difficulty moving from lying on back to sitting on the side of the bed? : Unable Difficulty sitting down on and standing up from a chair with arms (e.g., wheelchair, bedside commode, etc,.)?: Unable Help needed moving to and from a bed to chair (including a wheelchair)?: A Lot Help needed walking in hospital room?: A Little Help needed climbing 3-5 steps with a railing? : Total 6 Click Score: 11    End of Session Equipment Utilized During Treatment: Gait belt Activity Tolerance: Patient tolerated treatment well Patient left: in chair;with call bell/phone within reach;with chair alarm set Nurse Communication: Mobility status;Precautions;Other (comment)(Pt's O2 sats beginning of session) PT Visit Diagnosis: Muscle weakness (generalized) (M62.81);History of falling (Z91.81);Difficulty in walking, not elsewhere classified (R26.2);Unsteadiness on feet (R26.81)     Time: 3846-6599 PT Time Calculation (min)  (ACUTE ONLY): 53 min  Charges:  $Gait Training: 8-22 mins $Therapeutic Exercise: 8-22 mins $Therapeutic Activity: 23-37 mins                    G CodesLeitha Bleak, PT 08/15/17, 11:51 AM [phone]

## 2017-08-15 NOTE — Plan of Care (Signed)
Continue waiting on insurance approval. Hopefully will get today and pt to go to [facility] [place]. Problem: Education: Goal: Knowledge of General Education information will improve Outcome: Progressing   Problem: Health Behavior/Discharge Planning: Goal: Ability to manage health-related needs will improve Outcome: Progressing   Problem: Clinical Measurements: Goal: Ability to maintain clinical measurements within normal limits will improve Outcome: Progressing Goal: Will remain free from infection Outcome: Progressing Goal: Diagnostic test results will improve Outcome: Progressing Goal: Respiratory complications will improve Outcome: Progressing Goal: Cardiovascular complication will be avoided Outcome: Progressing   Problem: Activity: Goal: Risk for activity intolerance will decrease Outcome: Progressing   Problem: Nutrition: Goal: Adequate nutrition will be maintained Outcome: Progressing   Problem: Coping: Goal: Level of anxiety will decrease Outcome: Progressing   Problem: Elimination: Goal: Will not experience complications related to bowel motility Outcome: Progressing Goal: Will not experience complications related to urinary retention Outcome: Progressing   Problem: Pain Managment: Goal: General experience of comfort will improve Outcome: Progressing   Problem: Safety: Goal: Ability to remain free from injury will improve Outcome: Progressing   Problem: Skin Integrity: Goal: Risk for impaired skin integrity will decrease Outcome: Progressing

## 2017-08-15 NOTE — Clinical Social Work Note (Signed)
Patient has not received [employer] [facility]) authorization yet. CSW has called several times today to get updates on authorization but it has not been completed. CSW will continue to follow up with [facility] regarding authorization.   [place], [place]

## 2017-08-15 NOTE — Progress Notes (Signed)
Chaplain was leaving patient in room 103 when she noted that this patient's room door was open. As chaplain walked past, patient was sitting in chair with a bright smile. Chaplain asked if she could visit for a moment, and patient beckoned chaplain in. Patient immediately shared that she was excited to be going home today. She was glad to have found a place. Patient said she wants to be well and to be going to a good place. Chaplain provided emotional support and reminded her that she has continued to remember her in prayer for all that we have discussed since meeting. Chaplain thanked patient for the opportunity to talk with her since the beginning of her hospital stay. Patient asked for prayer and she and chaplain prayed together. Chaplain wished patient well and reassured her that she will continue to pray as she moves on.     08/15/17 1300  Clinical Encounter Type  Visited With Patient  Visit Type Spiritual support  Spiritual Encounters  Spiritual Needs Prayer  Stress Factors  Patient Stress Factors Family relationships

## 2017-08-16 DIAGNOSIS — F250 Schizoaffective disorder, bipolar type: Secondary | ICD-10-CM | POA: Diagnosis not present

## 2017-08-16 LAB — GLUCOSE, CAPILLARY: Glucose-Capillary: 101 mg/dL — ABNORMAL HIGH (ref 70–99)

## 2017-08-16 MED ORDER — GLIPIZIDE 10 MG PO TABS
10.0000 mg | ORAL_TABLET | Freq: Every day | ORAL | Status: DC
  Administered 2017-08-16: 10 mg via ORAL
  Filled 2017-08-16: qty 1

## 2017-08-16 MED ORDER — CARVEDILOL 3.125 MG PO TABS
6.2500 mg | ORAL_TABLET | Freq: Two times a day (BID) | ORAL | Status: DC
  Administered 2017-08-16: 09:00:00 6.25 mg via ORAL
  Filled 2017-08-16: qty 2

## 2017-08-16 NOTE — Progress Notes (Signed)
Patient ID: [name] [name], female   DOB: 03-03-1945, 72 y.o.   MRN: [id number]   [employer] PROGRESS NOTE  [name] [name]:[id number] DOB: [birth year] DOA: 07/27/2017 PCP: Patient, No Pcp Per  HPI/Subjective: Patient awakened from sleep.  Feels okay.  Patient states that she has not eaten in 2 days but nursing staff said she ate to dinner plates last night.  Objective: Vitals:   08/15/17 1916 08/16/17 0335  BP: 130/79 (!) 147/81  Pulse: 85 63  Resp: 17 17  Temp: 98.1 F (36.7 C) (!) 97.4 F (36.3 C)  SpO2: 98% 93%    Filed Weights   08/03/17 1629  Weight: 123.2 kg (271 lb 11.2 oz)    ROS: Review of Systems  Unable to perform ROS: Acuity of condition  Respiratory: Negative for cough.   Cardiovascular: Negative for chest pain.  Gastrointestinal: Negative for abdominal pain.  Musculoskeletal: Negative for joint pain.   Exam: Physical Exam  HENT:  Nose: No mucosal edema.  Mouth/Throat: No oropharyngeal exudate or posterior oropharyngeal edema.  Eyes: Pupils are equal, round, and reactive to light. Conjunctivae, EOM and lids are normal.  Neck: No JVD present. Carotid bruit is not present. No edema present. No thyroid mass and no thyromegaly present.  Cardiovascular: S1 normal and S2 normal. Exam reveals no gallop.  No murmur heard. Pulses:      Dorsalis pedis pulses are 2+ on the right side, and 2+ on the left side.  Respiratory: No respiratory distress. She has decreased breath sounds in the right lower field and the left lower field. She has no wheezes. She has no rhonchi. She has no rales.  GI: Soft. Bowel sounds are normal. There is no tenderness.  Musculoskeletal:       Right ankle: She exhibits swelling.       Left ankle: She exhibits swelling.  Lymphadenopathy:    She has no cervical adenopathy.  Neurological: She is alert. No cranial nerve deficit.  Skin: Skin is warm. No rash noted. Nails show no clubbing.  Psychiatric:  Answers some questions.       Data Reviewed: Basic Metabolic Panel: Recent Labs  Lab 08/13/17 0459 08/14/17 0453 08/15/17 0352  NA 138 140 139  K 5.4* 4.4 4.8  CL 101 102 100*  CO2 31 32 31  GLUCOSE 92 138* 86  BUN 32* 25* 27*  CREATININE 1.39* 1.03* 1.08*  CALCIUM 8.8* 8.4* 8.4*   CBC: Recent Labs  Lab 08/13/17 0459 08/15/17 0352  WBC 7.7 7.0  HGB 12.5 12.5  HCT 37.3 38.1  MCV 92.6 92.2  PLT 213 201   BNP (last 3 results) Recent Labs    01/26/17 0000 05/02/17 1521  BNP 74.0 45.0     CBG: Recent Labs  Lab 08/15/17 0736 08/15/17 1222 08/15/17 1635 08/15/17 2037 08/16/17 0751  GLUCAP 80 193* 89 135* 101*      Scheduled Meds: . aspirin EC  81 mg Oral Daily  . atorvastatin  20 mg Oral QHS  . benztropine  0.5 mg Oral BID  . carvedilol  6.25 mg Oral BID  . cholecalciferol  1,000 Units Oral Daily  . divalproex  500 mg Oral TID  . docusate sodium  200 mg Oral Daily  . famotidine  20 mg Oral BID  . feeding supplement (PRO-STAT SUGAR FREE 64)  30 mL Oral BID  . fluPHENAZine  5 mg Oral TID  . fluPHENAZine decanoate  50 mg Intramuscular Q14 Days  . furosemide  20 mg Oral Daily  . glipiZIDE  10 mg Oral Daily  . heparin  5,000 Units Subcutaneous Q8H  . insulin aspart  0-15 Units Subcutaneous TID WC  . insulin aspart  0-5 Units Subcutaneous QHS  . insulin glargine  11 Units Subcutaneous QHS  . levothyroxine  75 mcg Oral QAC breakfast  . loratadine  10 mg Oral Daily  . mometasone-formoterol  2 puff Inhalation BID  . multivitamin with minerals  1 tablet Oral Daily  . tiotropium  18 mcg Inhalation Daily   Continuous Infusions:  Assessment/Plan:  1. Acute kidney injury on chronic kidney disease stage III.  Improved during the hospital course. 2. Hyperkalemia improved with holding ACE inhibitor and a dose of Kayexalate 3. Acute hypoxic respiratory failure.   the patient has sleep apnea and desaturates at night while sleeping.  CPAP while sleeping.  If unable to do sleep apnea  must be able to give 2 L of oxygen at night. 4. Type 2 diabetes mellitus on Lantus sliding scale and glipizide 5. Hypothyroidism unspecified on Synthroid 6. Hypertension on  Coreg. 7. Hyperlipidemia unspecified on atorvastatin 8. Morbid obesity weight loss needed 9. Schizoaffective disorder.  Continue medications as per psychiatry   Code Status:     Code Status Orders  (From admission, onward)        Start     Ordered   08/03/17 1626  Full code  Continuous     08/03/17 1625    Code Status History    Date Active Date Inactive Code Status Order ID Comments User Context   07/02/2016 1403 07/03/2016 2031 Full Code [id number]  Charlesetta Shanks, MD ED   01/16/2016 0346 01/21/2016 1921 DNR [id number]  Lily Kocher, MD Inpatient   10/14/2015 1249 10/22/2015 2022 DNR [id number]  Laverle Hobby, MD Inpatient   10/13/2015 0201 10/14/2015 1249 Full Code [id number]  Mikael Spray, NP ED   10/29/2014 2043 11/05/2014 2003 Full Code [id number]  Gonzella Lex, MD Inpatient   2020/02/1814 1936 08/12/2014 1635 Full Code [id number]  Sheryle Spray, RN ED   06/29/2014 1523 07/04/2014 0050 Full Code [id number]  Gladstone Lighter, MD Inpatient   06/24/2011 1832 07/01/2011 0002 Full Code [id number]  Kathie Dike, MD Inpatient    Advance Directive Documentation     Most Recent Value  Type of Advance Directive  Living will  Pre-existing out of facility DNR order (yellow form or pink MOST form)  -  "MOST" Form in Place?  -      Disposition Plan:  insurance approved facility.  Edited at discharge summary from yesterday.  Time spent: 25 minutes.  Spoke with son on the phone and given update.  Patrick Sohm [employer]

## 2017-08-16 NOTE — Plan of Care (Signed)
  Problem: Education: Goal: Knowledge of General Education information will improve Outcome: Progressing   Problem: Health Behavior/Discharge Planning: Goal: Ability to manage health-related needs will improve Outcome: Progressing   Problem: Clinical Measurements: Goal: Ability to maintain clinical measurements within normal limits will improve Outcome: Progressing Goal: Will remain free from infection Outcome: Progressing Goal: Diagnostic test results will improve Outcome: Progressing   Problem: Activity: Goal: Risk for activity intolerance will decrease Outcome: Progressing   Problem: Elimination: Goal: Will not experience complications related to bowel motility Outcome: Progressing Goal: Will not experience complications related to urinary retention Outcome: Progressing   Problem: Safety: Goal: Ability to remain free from injury will improve Outcome: Progressing   Problem: Skin Integrity: Goal: Risk for impaired skin integrity will decrease Outcome: Progressing

## 2017-08-16 NOTE — Clinical Social Work Note (Signed)
Patient is medically ready for discharge today. CSW has received [employer] [facility]) authorization. CSW contacted guardian, [name] [phone] of discharge. Guardian would prefer patient go somewhere in [place]. CSW explained that [facility] is the only bed offer that patient received and that we would have to discharge today. Guardian agreed with discharged. CSW also notified Chrys Racer at [facility] of discharge. Patient will be transported via EMS. RN to call report and call for transport.   [place], [place]

## 2017-08-16 NOTE — Progress Notes (Signed)
Report called to gerri at [place].  Pt ready for transport. No c/o pt to go by ems who has been called.

## 2017-11-04 ENCOUNTER — Inpatient Hospital Stay
Admission: EM | Admit: 2017-11-04 | Discharge: 2017-11-08 | DRG: 190 | Disposition: A | Discharge status: Skilled Nursing Facility | Payer: Medicare HMO | Attending: Family Medicine | Admitting: Family Medicine

## 2017-11-04 ENCOUNTER — Encounter: Payer: Self-pay | Admitting: Emergency Medicine

## 2017-11-04 ENCOUNTER — Emergency Department: Payer: Medicare HMO

## 2017-11-04 ENCOUNTER — Other Ambulatory Visit: Payer: Self-pay

## 2017-11-04 DIAGNOSIS — Z79890 Hormone replacement therapy (postmenopausal): Secondary | ICD-10-CM

## 2017-11-04 DIAGNOSIS — J9622 Acute and chronic respiratory failure with hypercapnia: Secondary | ICD-10-CM | POA: Diagnosis present

## 2017-11-04 DIAGNOSIS — Z9049 Acquired absence of other specified parts of digestive tract: Secondary | ICD-10-CM

## 2017-11-04 DIAGNOSIS — E875 Hyperkalemia: Secondary | ICD-10-CM | POA: Diagnosis present

## 2017-11-04 DIAGNOSIS — Z87891 Personal history of nicotine dependence: Secondary | ICD-10-CM

## 2017-11-04 DIAGNOSIS — M623 Immobility syndrome (paraplegic): Secondary | ICD-10-CM | POA: Diagnosis present

## 2017-11-04 DIAGNOSIS — I5032 Chronic diastolic (congestive) heart failure: Secondary | ICD-10-CM | POA: Diagnosis present

## 2017-11-04 DIAGNOSIS — F250 Schizoaffective disorder, bipolar type: Secondary | ICD-10-CM | POA: Diagnosis present

## 2017-11-04 DIAGNOSIS — E7800 Pure hypercholesterolemia, unspecified: Secondary | ICD-10-CM | POA: Diagnosis present

## 2017-11-04 DIAGNOSIS — B9620 Unspecified Escherichia coli [E. coli] as the cause of diseases classified elsewhere: Secondary | ICD-10-CM | POA: Diagnosis present

## 2017-11-04 DIAGNOSIS — Z91018 Allergy to other foods: Secondary | ICD-10-CM

## 2017-11-04 DIAGNOSIS — I2510 Atherosclerotic heart disease of native coronary artery without angina pectoris: Secondary | ICD-10-CM | POA: Diagnosis present

## 2017-11-04 DIAGNOSIS — L8992 Pressure ulcer of unspecified site, stage 2: Secondary | ICD-10-CM | POA: Diagnosis not present

## 2017-11-04 DIAGNOSIS — K219 Gastro-esophageal reflux disease without esophagitis: Secondary | ICD-10-CM | POA: Diagnosis present

## 2017-11-04 DIAGNOSIS — J9621 Acute and chronic respiratory failure with hypoxia: Secondary | ICD-10-CM | POA: Diagnosis present

## 2017-11-04 DIAGNOSIS — M1990 Unspecified osteoarthritis, unspecified site: Secondary | ICD-10-CM | POA: Diagnosis present

## 2017-11-04 DIAGNOSIS — E874 Mixed disorder of acid-base balance: Secondary | ICD-10-CM | POA: Diagnosis present

## 2017-11-04 DIAGNOSIS — Z794 Long term (current) use of insulin: Secondary | ICD-10-CM

## 2017-11-04 DIAGNOSIS — R0602 Shortness of breath: Secondary | ICD-10-CM

## 2017-11-04 DIAGNOSIS — J441 Chronic obstructive pulmonary disease with (acute) exacerbation: Secondary | ICD-10-CM | POA: Diagnosis not present

## 2017-11-04 DIAGNOSIS — Z79899 Other long term (current) drug therapy: Secondary | ICD-10-CM

## 2017-11-04 DIAGNOSIS — Z888 Allergy status to other drugs, medicaments and biological substances status: Secondary | ICD-10-CM

## 2017-11-04 DIAGNOSIS — Z9119 Patient's noncompliance with other medical treatment and regimen: Secondary | ICD-10-CM

## 2017-11-04 DIAGNOSIS — L8990 Pressure ulcer of unspecified site, unspecified stage: Secondary | ICD-10-CM

## 2017-11-04 DIAGNOSIS — Z9842 Cataract extraction status, left eye: Secondary | ICD-10-CM

## 2017-11-04 DIAGNOSIS — N390 Urinary tract infection, site not specified: Secondary | ICD-10-CM | POA: Diagnosis present

## 2017-11-04 DIAGNOSIS — G9340 Encephalopathy, unspecified: Secondary | ICD-10-CM | POA: Diagnosis present

## 2017-11-04 DIAGNOSIS — Z7982 Long term (current) use of aspirin: Secondary | ICD-10-CM

## 2017-11-04 DIAGNOSIS — Z96659 Presence of unspecified artificial knee joint: Secondary | ICD-10-CM | POA: Diagnosis present

## 2017-11-04 DIAGNOSIS — E1122 Type 2 diabetes mellitus with diabetic chronic kidney disease: Secondary | ICD-10-CM | POA: Diagnosis present

## 2017-11-04 DIAGNOSIS — N189 Chronic kidney disease, unspecified: Secondary | ICD-10-CM | POA: Diagnosis present

## 2017-11-04 DIAGNOSIS — I130 Hypertensive heart and chronic kidney disease with heart failure and stage 1 through stage 4 chronic kidney disease, or unspecified chronic kidney disease: Secondary | ICD-10-CM | POA: Diagnosis present

## 2017-11-04 DIAGNOSIS — G4733 Obstructive sleep apnea (adult) (pediatric): Secondary | ICD-10-CM | POA: Diagnosis present

## 2017-11-04 DIAGNOSIS — Z961 Presence of intraocular lens: Secondary | ICD-10-CM | POA: Diagnosis present

## 2017-11-04 DIAGNOSIS — R0689 Other abnormalities of breathing: Secondary | ICD-10-CM

## 2017-11-04 DIAGNOSIS — Z8249 Family history of ischemic heart disease and other diseases of the circulatory system: Secondary | ICD-10-CM

## 2017-11-04 DIAGNOSIS — Z9841 Cataract extraction status, right eye: Secondary | ICD-10-CM

## 2017-11-04 LAB — BASIC METABOLIC PANEL
ANION GAP: 7 (ref 5–15)
BUN: 18 mg/dL (ref 8–23)
CALCIUM: 9 mg/dL (ref 8.9–10.3)
CO2: 34 mmol/L — AB (ref 22–32)
CREATININE: 0.93 mg/dL (ref 0.44–1.00)
Chloride: 98 mmol/L (ref 98–111)
GFR, EST NON AFRICAN AMERICAN: 60 mL/min — AB (ref >60)
Glucose, Bld: 131 mg/dL — ABNORMAL HIGH (ref 70–99)
Potassium: 5.5 mmol/L — ABNORMAL HIGH (ref 3.5–5.1)
SODIUM: 139 mmol/L (ref 135–145)

## 2017-11-04 LAB — TROPONIN I

## 2017-11-04 LAB — CBC
HCT: 42.7 % (ref 35.0–47.0)
HEMOGLOBIN: 14.3 g/dL (ref 12.0–16.0)
MCH: 30 pg (ref 26.0–34.0)
MCHC: 33.5 g/dL (ref 32.0–36.0)
MCV: 89.5 fL (ref 80.0–100.0)
PLATELETS: 184 10*3/uL (ref 150–440)
RBC: 4.77 MIL/uL (ref 3.80–5.20)
RDW: 14.7 % — ABNORMAL HIGH (ref 11.5–14.5)
WBC: 15.2 10*3/uL — AB (ref 3.6–11.0)

## 2017-11-04 MED ORDER — IPRATROPIUM-ALBUTEROL 0.5-2.5 (3) MG/3ML IN SOLN
3.0000 mL | Freq: Once | RESPIRATORY_TRACT | Status: AC
  Administered 2017-11-04: 3 mL via RESPIRATORY_TRACT
  Filled 2017-11-04: qty 3

## 2017-11-04 MED ORDER — LABETALOL HCL 5 MG/ML IV SOLN
10.0000 mg | Freq: Once | INTRAVENOUS | Status: AC
  Administered 2017-11-04: 10 mg via INTRAVENOUS
  Filled 2017-11-04: qty 4

## 2017-11-04 NOTE — ED Provider Notes (Signed)
[facility] Emergency Department Provider Note ____________________________________________   First MD Initiated Contact with Patient 11/04/17 2044     (approximate)  I have reviewed the triage vital signs and the nursing notes.   HISTORY  Chief Complaint Shortness of Breath  Level 5 caveat: History of present illness limited due to schizophrenia and poor historian  HPI [name] [name] is a 72 y.o. female with PMH as noted below who apparently presents for EMS.  Per EMS, the patient's son called for shortness of breath after he and the patient got into an argument.  The patient endorses some shortness of breath although she states it has improved.  She denies a headache and reports that she may have some chest pain.  Past Medical History:  Diagnosis Date  . Anxiety   . Arthritis   . Bronchitis   . Bursitis   . CAD (coronary artery disease)   . CHF (congestive heart failure) ([place])   . Chronic cough   . Chronic kidney disease    shadow on x-ray  . COPD (chronic obstructive pulmonary disease) ([place])   . Depression   . DM2 (diabetes mellitus, type 2) ([place])   . Environmental allergies   . GERD (gastroesophageal reflux disease)   . Hypercholesteremia   . Hypertension   . Left ventricular outflow tract obstruction    a. echo 03/2014: EF 60-65%, hypernamic LV systolic fxn, mod LVH w/ LVOT gradient estimated at 68 mm Hg w/ valsalva, very small LV internal cavity size, mildly increased LV posterior wall thickness, mild Ao valve scl w/o stenosis, diastolic dysfunction, normal RVSP  . Lower extremity edema   . LVH (left ventricular hypertrophy)    a. echo suggests long standing uncontrolled htn. she will not do well when dehydrated, LV cavity obliteration  . Muscle weakness   . Obesity   . On supplemental oxygen therapy    AS NEEDED  . OSA (obstructive sleep apnea)    does not use machine  . Osteoarthritis   . Schizophrenia ([place])   . Tremors of nervous  system   . Wheezing     Patient Active Problem List   Diagnosis Date Noted  . Acute renal failure (ARF) ([place]) 08/05/2017  . ARF (acute renal failure) ([place]) 08/03/2017  . UTI (urinary tract infection) 01/16/2016  . Altered mental status 01/16/2016  . Metabolic encephalopathy   . Acute on chronic respiratory failure with hypoxia and hypercapnia ([place]) 10/13/2015  . Involuntary commitment 09/02/2015  . Schizoaffective disorder, bipolar type ([place])   . Urinary incontinence 10/30/2014  . GERD (gastroesophageal reflux disease) 10/30/2014  . OSA (obstructive sleep apnea) 10/30/2014  . Osteoarthrosis, unspecified whether generalized or localized, involving lower leg 10/30/2014  . COPD (chronic obstructive pulmonary disease) ([place]) 10/30/2014  . Chronic diastolic CHF (congestive heart failure) ([place]) 05/15/2014  . Morbid obesity ([place])   . History of colon polyps 03/09/2013  . Renal mass 06/26/2011  . Lytic bone lesion of hip 06/25/2011  . Hypertension 06/24/2011  . Diabetes mellitus ([place]) 06/24/2011    Past Surgical History:  Procedure Laterality Date  . CATARACT EXTRACTION W/PHACO Left 09/10/2014   Procedure: CATARACT EXTRACTION PHACO AND INTRAOCULAR LENS PLACEMENT (IOC);  Surgeon: Birder Robson, MD;  Location: [facility] ORS;  Service: Ophthalmology;  Laterality: Left;  [place]: 00:44 AP%: 22.8 CDE: 10.24 Fluid lot #[id number]  . CATARACT EXTRACTION W/PHACO Right 10/15/2014   Procedure: CATARACT EXTRACTION PHACO AND INTRAOCULAR LENS PLACEMENT (IOC);  Surgeon: Birder Robson, MD;  Location:  [facility] ORS;  Service: Ophthalmology;  Laterality: Right;  [place]: 00:52 AP:40.1 CDE:11.83 LOT PACK #[id number]  . CHOLECYSTECTOMY    . COLONOSCOPY  04/2011   [facility] per patient incomplete  . EYE SURGERY    . JOINT REPLACEMENT     TKR  . KNEE RECONSTRUCTION, MEDIAL PATELLAR FEMORAL LIGAMENT    . RENAL BIOPSY    . TONSILLECTOMY    . TONSILLECTOMY      Prior to Admission medications   Medication Sig Start Date End  Date Taking? Authorizing Provider  acetaminophen (TYLENOL) 325 MG tablet Take 2 tablets (650 mg total) by mouth every 4 (four) hours as needed for mild pain (temp > 101.5). 10/20/15   Gladstone Lighter, MD  albuterol (PROVENTIL HFA;VENTOLIN HFA) 108 (90 Base) MCG/ACT inhaler Inhale 2 puffs into the lungs every 6 (six) hours as needed for wheezing or shortness of breath.    [provider]  Amino Acids-Protein Hydrolys (FEEDING SUPPLEMENT, PRO-STAT SUGAR FREE 64,) LIQD Take 30 mLs by mouth 2 (two) times daily.    [provider]  aspirin EC 81 MG tablet Take 81 mg by mouth daily.    [provider]  atorvastatin (LIPITOR) 20 MG tablet Take 20 mg by mouth at bedtime.    [provider]  benztropine (COGENTIN) 0.5 MG tablet Take 0.5 mg by mouth 2 (two) times daily.    [provider]  carvedilol (COREG) 6.25 MG tablet Take 1 tablet (6.25 mg total) by mouth 2 (two) times daily. 08/15/17   Loletha Grayer, MD  cetirizine (ZYRTEC) 10 MG tablet Take 10 mg by mouth daily.    [provider]  cholecalciferol (VITAMIN D) 1000 units tablet Take 1,000 Units by mouth daily.    [provider]  divalproex (DEPAKOTE) 500 MG DR tablet Take 1 tablet (500 mg total) by mouth 3 (three) times daily. 08/15/17   Loletha Grayer, MD  docusate sodium (COLACE) 100 MG capsule Take 200 mg by mouth daily.    [provider]  fluPHENAZine (PROLIXIN) 5 MG/ML solution Take 5 mg by mouth 3 (three) times daily.    [provider]  fluPHENAZine decanoate (PROLIXIN) 25 MG/ML injection Inject 2 mLs (50 mg total) into the muscle every 14 (fourteen) days. 08/25/17   Loletha Grayer, MD  Fluticasone-Salmeterol (ADVAIR) 250-50 MCG/DOSE AEPB Inhale 1 puff into the lungs 2 (two) times daily.    [provider]  furosemide (LASIX) 20 MG tablet Take 1 tablet (20 mg total) by mouth daily. 08/15/17   Loletha Grayer, MD  glipiZIDE (GLUCOTROL) 10 MG tablet  Take 1 tablet by mouth daily. 04/06/17   [provider]  guaiFENesin (ROBITUSSIN) 100 MG/5ML SOLN Take 15 mLs by mouth every 6 (six) hours as needed for cough or to loosen phlegm. *Notify physician if cough persists more than 3 days*    [provider]  insulin aspart (NOVOLOG) 100 UNIT/ML injection Inject 4 Units into the skin 3 (three) times daily before meals. 08/15/17   Loletha Grayer, MD  insulin glargine (LANTUS) 100 UNIT/ML injection Inject 11 Units into the skin at bedtime.     [provider]  levothyroxine (SYNTHROID, LEVOTHROID) 75 MCG tablet Take 1 tablet by mouth daily. 04/05/17   [provider]  magnesium hydroxide (MILK OF MAGNESIA) 400 MG/5ML suspension Take 30 mLs by mouth daily as needed for mild constipation.    [provider]  Multiple Vitamin (MULTIVITAMIN WITH MINERALS) TABS tablet Take 1 tablet by mouth daily.  [provider]  nitroGLYCERIN (NITROSTAT) 0.4 MG SL tablet Place 0.4 mg under the tongue every 5 (five) minutes as needed for chest pain.    [provider]  polyethylene glycol (MIRALAX / GLYCOLAX) packet Take 17 g by mouth daily as needed for mild constipation. 08/15/17   Loletha Grayer, MD  ranitidine (ZANTAC) 150 MG tablet Take 150 mg by mouth 2 (two) times daily.    [provider]  Skin Protectants, Misc. (EUCERIN) cream Apply 1 application topically daily.    [provider]  tiotropium (SPIRIVA) 18 MCG inhalation capsule Place 1 capsule (18 mcg total) into inhaler and inhale daily. 07/02/14   [name], MD    Allergies Haldol [haloperidol decanoate]; Metformin; Prednisone; and Raspberry  Family History  Problem Relation Age of Onset  . Heart attack Mother   . Colon cancer Neg Hx   . Liver disease Neg Hx     Social History Social History   Tobacco Use  . Smoking status: Former Smoker    Packs/day: 3.00    Years: 40.00    Pack years: 120.00  . Smokeless  tobacco: Never Used  . Tobacco comment: quit in 2009  Substance Use Topics  . Alcohol use: No    Comment: occ.  . Drug use: No    Review of Systems Level 5 caveat: Unable to obtain review of systems due to schizophrenia and unreliable historian    ____________________________________________   PHYSICAL EXAM:  VITAL SIGNS: ED Triage Vitals  Enc Vitals Group     BP 11/04/17 2030 (!) 205/105     Pulse Rate 11/04/17 2030 80     Resp 11/04/17 2030 18     Temp 11/04/17 2023 99 F (37.2 C)     Temp Source 11/04/17 2023 Oral     SpO2 11/04/17 2030 96 %     Weight 11/04/17 2023 275 lb (124.7 kg)     Height 11/04/17 2023 5\' 3"  (1.6 m)     Head Circumference --      Peak Flow --      Pain Score 11/04/17 2112 0     Pain Loc --      Pain Edu? --      Excl. in [place]? --     Constitutional: Alert and oriented.  Chronically weak and ill-appearing but in no acute distress. Eyes: Conjunctivae are normal.  EOMI.  PERRLA. Head: Atraumatic. Nose: No congestion/rhinnorhea. Mouth/Throat: Mucous membranes are somewhat dry.   Neck: Normal range of motion.  Cardiovascular: Normal rate, regular rhythm. Grossly normal heart sounds.  Good peripheral circulation. Respiratory: Normal respiratory effort.  No retractions.  Decreased breath sounds bilaterally. Gastrointestinal: Soft and nontender. No distention.  Genitourinary: No flank tenderness. Musculoskeletal: Trace bilateral lower extremity edema.  Extremities warm and well perfused.  Neurologic: Motor intact in all extremities no gross focal neurologic deficits are appreciated.  Skin:  Skin is warm and dry. No rash noted. Psychiatric: Somewhat flat affect.  Calm and cooperative.  ____________________________________________   LABS (all labs ordered are listed, but only abnormal results are displayed)  Labs Reviewed  BASIC METABOLIC PANEL - Abnormal; Notable for the following components:      Result Value   Potassium 5.5 (*)    CO2 34  (*)    Glucose, Bld 131 (*)    GFR calc non Af Amer 60 (*)    All other components within normal limits  CBC - Abnormal; Notable for the following components:  WBC 15.2 (*)    RDW 14.7 (*)    All other components within normal limits  TROPONIN I  URINALYSIS, COMPLETE (UACMP) WITH MICROSCOPIC  BLOOD GAS, VENOUS   ____________________________________________  EKG  ED ECG REPORT I, Arta Silence, the attending physician, personally viewed and interpreted this ECG.  Date: 11/04/2017 EKG Time: 2053 Rate: 96 Rhythm: Atrial fibrillation QRS Axis: normal Intervals: normal ST/T Wave abnormalities: Nonspecific repolarization abnormality Narrative Interpretation: no evidence of acute ischemia; no significant change when compared to EKG of 05/02/2017  ____________________________________________  RADIOLOGY  CXR: No focal infiltrate  ____________________________________________   PROCEDURES  Procedure(s) performed: No  Procedures  Critical Care performed: No ____________________________________________   INITIAL IMPRESSION / ASSESSMENT AND PLAN / ED COURSE  Pertinent labs & imaging results that were available during my care of the patient were reviewed by me and considered in my medical decision making (see chart for details).  72 year old female with history of COPD and other PMH as noted above presents with apparent episode of shortness of breath which appears to be resolving at this time.  She is schizophrenic and has some difficulty giving detailed history.  I reviewed the past medical records in Epic; the patient has had multiple prior ED visits.  She was most recently admitted in June of this year with acute renal insufficiency and hypoxia.  On exam currently, her O2 saturation is in the mid 90s on 3 L by [place].  Her vital signs are normal except for borderline temperature and significantly elevated blood pressure.  She has decreased breath sounds bilaterally, but  no active wheezing and no respiratory distress.  Overall I suspect most likely mild acute COPD exacerbation.  At this time my main concern is the patient's significantly elevated blood pressure.  Although she has no severe headache, altered mental status, or EKG changes, I have concern for end organ dysfunction.  We will obtain labs including cardiac enzymes, chest x-ray, give IV labetalol, and reassess.  ----------------------------------------- 12:04 AM on 11/05/2017 -----------------------------------------  The patient's blood pressure is markedly improved.  She appears comfortable.  The remainder of the work-up so far is unremarkable.  Her K is slightly elevated although I suspect that this is due to hemolysis as she has no significant renal insufficiency, and no peaked T waves or other EKG abnormalities.  The patient's son is now here.  He states that he took the patient out of her facility because of an injury that she had when a TV fell on her and he felt like her care was not adequate.  He brought her home.  He states she has been slightly more confused and weak in the last few days, and is concerned that she could have a UTI or have a high CO2.  The patient is still pending a UA, and I added on a VBG.  If these tests are negative, he feels comfortable taking her home.  I am signing the patient out to the oncoming physician Dr. Beather Arbour. ____________________________________________   FINAL CLINICAL IMPRESSION(S) / ED DIAGNOSES  Final diagnoses:  Shortness of breath      NEW MEDICATIONS STARTED DURING THIS VISIT:  New Prescriptions   No medications on file     Note:  This document was prepared using Dragon voice recognition software and may include unintentional dictation errors.    Arta Silence, MD 11/05/17 [phone]

## 2017-11-04 NOTE — ED Triage Notes (Addendum)
Pt presents to ED via [facility] from home. EMS report pt's son called EMS for SOB after pt and son got into an argument. EMS report pt's son had visitors at the house today and pt became paranoid (hx schizophrenia) about strange people in the house. No acute distress noted on arrival. O2 sat 94-95% on chronic 3L.

## 2017-11-05 ENCOUNTER — Encounter: Payer: Self-pay | Admitting: Internal Medicine

## 2017-11-05 DIAGNOSIS — F250 Schizoaffective disorder, bipolar type: Secondary | ICD-10-CM | POA: Diagnosis present

## 2017-11-05 DIAGNOSIS — I5032 Chronic diastolic (congestive) heart failure: Secondary | ICD-10-CM | POA: Diagnosis present

## 2017-11-05 DIAGNOSIS — M623 Immobility syndrome (paraplegic): Secondary | ICD-10-CM | POA: Diagnosis present

## 2017-11-05 DIAGNOSIS — K219 Gastro-esophageal reflux disease without esophagitis: Secondary | ICD-10-CM | POA: Diagnosis present

## 2017-11-05 DIAGNOSIS — R410 Disorientation, unspecified: Secondary | ICD-10-CM | POA: Diagnosis not present

## 2017-11-05 DIAGNOSIS — E874 Mixed disorder of acid-base balance: Secondary | ICD-10-CM | POA: Diagnosis present

## 2017-11-05 DIAGNOSIS — R4182 Altered mental status, unspecified: Secondary | ICD-10-CM | POA: Diagnosis not present

## 2017-11-05 DIAGNOSIS — Z8659 Personal history of other mental and behavioral disorders: Secondary | ICD-10-CM

## 2017-11-05 DIAGNOSIS — I130 Hypertensive heart and chronic kidney disease with heart failure and stage 1 through stage 4 chronic kidney disease, or unspecified chronic kidney disease: Secondary | ICD-10-CM | POA: Diagnosis present

## 2017-11-05 DIAGNOSIS — Z7982 Long term (current) use of aspirin: Secondary | ICD-10-CM | POA: Diagnosis not present

## 2017-11-05 DIAGNOSIS — L8992 Pressure ulcer of unspecified site, stage 2: Secondary | ICD-10-CM | POA: Diagnosis not present

## 2017-11-05 DIAGNOSIS — Z9841 Cataract extraction status, right eye: Secondary | ICD-10-CM | POA: Diagnosis not present

## 2017-11-05 DIAGNOSIS — J9621 Acute and chronic respiratory failure with hypoxia: Secondary | ICD-10-CM | POA: Diagnosis present

## 2017-11-05 DIAGNOSIS — G4733 Obstructive sleep apnea (adult) (pediatric): Secondary | ICD-10-CM | POA: Diagnosis present

## 2017-11-05 DIAGNOSIS — R0602 Shortness of breath: Secondary | ICD-10-CM | POA: Diagnosis present

## 2017-11-05 DIAGNOSIS — G9340 Encephalopathy, unspecified: Secondary | ICD-10-CM | POA: Diagnosis present

## 2017-11-05 DIAGNOSIS — N390 Urinary tract infection, site not specified: Secondary | ICD-10-CM

## 2017-11-05 DIAGNOSIS — J9622 Acute and chronic respiratory failure with hypercapnia: Secondary | ICD-10-CM | POA: Diagnosis present

## 2017-11-05 DIAGNOSIS — B9620 Unspecified Escherichia coli [E. coli] as the cause of diseases classified elsewhere: Secondary | ICD-10-CM | POA: Diagnosis present

## 2017-11-05 DIAGNOSIS — Z9842 Cataract extraction status, left eye: Secondary | ICD-10-CM | POA: Diagnosis not present

## 2017-11-05 DIAGNOSIS — E875 Hyperkalemia: Secondary | ICD-10-CM | POA: Diagnosis present

## 2017-11-05 DIAGNOSIS — I2510 Atherosclerotic heart disease of native coronary artery without angina pectoris: Secondary | ICD-10-CM | POA: Diagnosis present

## 2017-11-05 DIAGNOSIS — J441 Chronic obstructive pulmonary disease with (acute) exacerbation: Secondary | ICD-10-CM | POA: Diagnosis present

## 2017-11-05 DIAGNOSIS — N189 Chronic kidney disease, unspecified: Secondary | ICD-10-CM | POA: Diagnosis present

## 2017-11-05 DIAGNOSIS — M1990 Unspecified osteoarthritis, unspecified site: Secondary | ICD-10-CM | POA: Diagnosis present

## 2017-11-05 DIAGNOSIS — E7800 Pure hypercholesterolemia, unspecified: Secondary | ICD-10-CM | POA: Diagnosis present

## 2017-11-05 DIAGNOSIS — E1122 Type 2 diabetes mellitus with diabetic chronic kidney disease: Secondary | ICD-10-CM | POA: Diagnosis present

## 2017-11-05 LAB — URINALYSIS, COMPLETE (UACMP) WITH MICROSCOPIC
Bilirubin Urine: NEGATIVE
GLUCOSE, UA: NEGATIVE mg/dL
KETONES UR: 5 mg/dL — AB
NITRITE: NEGATIVE
PROTEIN: 30 mg/dL — AB
Specific Gravity, Urine: 1.014 (ref 1.005–1.030)
WBC, UA: >50 WBC/hpf — ABNORMAL HIGH (ref 0–5)
pH: 7 (ref 5.0–8.0)

## 2017-11-05 LAB — PROCALCITONIN

## 2017-11-05 LAB — GLUCOSE, CAPILLARY
GLUCOSE-CAPILLARY: 95 mg/dL (ref 70–99)
GLUCOSE-CAPILLARY: 101 mg/dL — AB (ref 70–99)
Glucose-Capillary: 222 mg/dL — ABNORMAL HIGH (ref 70–99)
Glucose-Capillary: 133 mg/dL — ABNORMAL HIGH (ref 70–99)
Glucose-Capillary: 99 mg/dL (ref 70–99)

## 2017-11-05 LAB — BLOOD GAS, ARTERIAL
ACID-BASE EXCESS: 14.6 mmol/L — AB (ref 0.0–2.0)
Bicarbonate: 43.2 mmol/L — ABNORMAL HIGH (ref 20.0–28.0)
Delivery systems: POSITIVE
EXPIRATORY PAP: 6
FIO2: 0.3
INSPIRATORY PAP: 14
LHR: 16 {breaths}/min
O2 Saturation: 94.8 %
PCO2 ART: 73 mmHg — AB (ref 32.0–48.0)
PH ART: 7.38 (ref 7.350–7.450)
Patient temperature: 37
pO2, Arterial: 76 mmHg — ABNORMAL LOW (ref 83.0–108.0)

## 2017-11-05 LAB — LACTIC ACID, PLASMA: LACTIC ACID, VENOUS: 1.8 mmol/L (ref 0.5–1.9)

## 2017-11-05 MED ORDER — FAMOTIDINE 20 MG PO TABS
20.0000 mg | ORAL_TABLET | Freq: Two times a day (BID) | ORAL | Status: DC
  Administered 2017-11-05 – 2017-11-08 (×6): 20 mg via ORAL
  Filled 2017-11-05 (×6): qty 1

## 2017-11-05 MED ORDER — NITROGLYCERIN 0.4 MG SL SUBL: 0.4000 mg | SUBLINGUAL_TABLET | SUBLINGUAL | Status: DC | PRN

## 2017-11-05 MED ORDER — ONDANSETRON HCL 4 MG/2ML IJ SOLN: 4.0000 mg | Freq: Four times a day (QID) | INTRAMUSCULAR | Status: DC | PRN

## 2017-11-05 MED ORDER — SODIUM CHLORIDE 0.9 % IV SOLN
1.0000 g | INTRAVENOUS | Status: DC
  Administered 2017-11-06 – 2017-11-07 (×2): 1 g via INTRAVENOUS
  Filled 2017-11-05 (×2): qty 1

## 2017-11-05 MED ORDER — IPRATROPIUM-ALBUTEROL 0.5-2.5 (3) MG/3ML IN SOLN
3.0000 mL | RESPIRATORY_TRACT | Status: DC | PRN
  Administered 2017-11-07: 3 mL via RESPIRATORY_TRACT

## 2017-11-05 MED ORDER — ATORVASTATIN CALCIUM 20 MG PO TABS
20.0000 mg | ORAL_TABLET | Freq: Every day | ORAL | Status: DC
  Administered 2017-11-05 – 2017-11-07 (×3): 20 mg via ORAL
  Filled 2017-11-05 (×3): qty 1

## 2017-11-05 MED ORDER — IPRATROPIUM-ALBUTEROL 0.5-2.5 (3) MG/3ML IN SOLN
3.0000 mL | Freq: Four times a day (QID) | RESPIRATORY_TRACT | Status: DC
  Administered 2017-11-05 – 2017-11-06 (×5): 3 mL via RESPIRATORY_TRACT
  Filled 2017-11-05 (×5): qty 3

## 2017-11-05 MED ORDER — INSULIN ASPART 100 UNIT/ML ~~LOC~~ SOLN: 0.0000 [IU] | Freq: Every day | SUBCUTANEOUS | Status: DC

## 2017-11-05 MED ORDER — INSULIN GLARGINE 100 UNIT/ML ~~LOC~~ SOLN
11.0000 [IU] | Freq: Every day | SUBCUTANEOUS | Status: DC
  Administered 2017-11-05 – 2017-11-06 (×2): 11 [IU] via SUBCUTANEOUS
  Filled 2017-11-05 (×4): qty 0.11

## 2017-11-05 MED ORDER — ZIPRASIDONE MESYLATE 20 MG IM SOLR: 20.0000 mg | Freq: Once | INTRAMUSCULAR | Status: DC

## 2017-11-05 MED ORDER — SODIUM CHLORIDE 0.9 % IV SOLN
1.0000 g | Freq: Once | INTRAVENOUS | Status: AC
  Administered 2017-11-05: 1 g via INTRAVENOUS
  Filled 2017-11-05: qty 10

## 2017-11-05 MED ORDER — OLANZAPINE 10 MG IM SOLR
5.0000 mg | Freq: Every day | INTRAMUSCULAR | Status: DC | PRN
  Filled 2017-11-05 (×2): qty 10

## 2017-11-05 MED ORDER — ONDANSETRON HCL 4 MG PO TABS: 4.0000 mg | ORAL_TABLET | Freq: Four times a day (QID) | ORAL | Status: DC | PRN

## 2017-11-05 MED ORDER — OLANZAPINE 5 MG PO TBDP
5.0000 mg | ORAL_TABLET | Freq: Every day | ORAL | Status: DC | PRN
  Filled 2017-11-05: qty 1

## 2017-11-05 MED ORDER — CARVEDILOL 3.125 MG PO TABS
6.2500 mg | ORAL_TABLET | Freq: Two times a day (BID) | ORAL | Status: DC
  Administered 2017-11-05 – 2017-11-07 (×6): 6.25 mg via ORAL
  Filled 2017-11-05: qty 2
  Filled 2017-11-05: qty 1
  Filled 2017-11-05 (×3): qty 2

## 2017-11-05 MED ORDER — ACETAMINOPHEN 325 MG PO TABS: 650.0000 mg | ORAL_TABLET | Freq: Four times a day (QID) | ORAL | Status: DC | PRN

## 2017-11-05 MED ORDER — BENZTROPINE MESYLATE 0.5 MG PO TABS
0.5000 mg | ORAL_TABLET | Freq: Two times a day (BID) | ORAL | Status: DC
  Administered 2017-11-05 – 2017-11-08 (×6): 0.5 mg via ORAL
  Filled 2017-11-05 (×9): qty 1

## 2017-11-05 MED ORDER — INSULIN ASPART 100 UNIT/ML ~~LOC~~ SOLN
4.0000 [IU] | Freq: Three times a day (TID) | SUBCUTANEOUS | Status: DC
  Administered 2017-11-05 – 2017-11-08 (×10): 4 [IU] via SUBCUTANEOUS
  Filled 2017-11-05 (×11): qty 1

## 2017-11-05 MED ORDER — FUROSEMIDE 20 MG PO TABS
20.0000 mg | ORAL_TABLET | Freq: Every day | ORAL | Status: DC
  Administered 2017-11-05 – 2017-11-08 (×4): 20 mg via ORAL
  Filled 2017-11-05 (×4): qty 1

## 2017-11-05 MED ORDER — INSULIN ASPART 100 UNIT/ML ~~LOC~~ SOLN
0.0000 [IU] | Freq: Three times a day (TID) | SUBCUTANEOUS | Status: DC
  Administered 2017-11-05: 5 [IU] via SUBCUTANEOUS
  Administered 2017-11-05 – 2017-11-06 (×2): 2 [IU] via SUBCUTANEOUS
  Administered 2017-11-06: 3 [IU] via SUBCUTANEOUS
  Administered 2017-11-06 – 2017-11-07 (×2): 2 [IU] via SUBCUTANEOUS
  Administered 2017-11-07: 3 [IU] via SUBCUTANEOUS
  Administered 2017-11-08: 5 [IU] via SUBCUTANEOUS
  Filled 2017-11-05 (×8): qty 1

## 2017-11-05 MED ORDER — ACETAMINOPHEN 650 MG RE SUPP: 650.0000 mg | Freq: Four times a day (QID) | RECTAL | Status: DC | PRN

## 2017-11-05 MED ORDER — DEXMEDETOMIDINE HCL IN NACL 400 MCG/100ML IV SOLN
0.4000 ug/kg/h | INTRAVENOUS | Status: DC
  Filled 2017-11-05: qty 100

## 2017-11-05 MED ORDER — ZIPRASIDONE MESYLATE 20 MG IM SOLR
20.0000 mg | Freq: Once | INTRAMUSCULAR | Status: DC
  Filled 2017-11-05: qty 20

## 2017-11-05 MED ORDER — LEVOTHYROXINE SODIUM 50 MCG PO TABS
75.0000 ug | ORAL_TABLET | Freq: Every day | ORAL | Status: DC
  Administered 2017-11-06 – 2017-11-08 (×3): 75 ug via ORAL
  Filled 2017-11-05 (×2): qty 1
  Filled 2017-11-05: qty 2
  Filled 2017-11-05: qty 1

## 2017-11-05 MED ORDER — MOMETASONE FURO-FORMOTEROL FUM 200-5 MCG/ACT IN AERO
2.0000 | INHALATION_SPRAY | Freq: Two times a day (BID) | RESPIRATORY_TRACT | Status: DC
  Administered 2017-11-05 – 2017-11-08 (×6): 2 via RESPIRATORY_TRACT
  Filled 2017-11-05: qty 8.8

## 2017-11-05 MED ORDER — ENOXAPARIN SODIUM 40 MG/0.4ML ~~LOC~~ SOLN
40.0000 mg | Freq: Two times a day (BID) | SUBCUTANEOUS | Status: DC
  Administered 2017-11-05 – 2017-11-08 (×7): 40 mg via SUBCUTANEOUS
  Filled 2017-11-05 (×7): qty 0.4

## 2017-11-05 MED ORDER — ADULT MULTIVITAMIN W/MINERALS CH
1.0000 | ORAL_TABLET | Freq: Every day | ORAL | Status: DC
  Administered 2017-11-06 – 2017-11-08 (×3): 1 via ORAL
  Filled 2017-11-05 (×3): qty 1

## 2017-11-05 MED ORDER — SENNOSIDES-DOCUSATE SODIUM 8.6-50 MG PO TABS: 1.0000 | ORAL_TABLET | Freq: Every evening | ORAL | Status: DC | PRN

## 2017-11-05 MED ORDER — BISACODYL 5 MG PO TBEC: 5.0000 mg | DELAYED_RELEASE_TABLET | Freq: Every day | ORAL | Status: DC | PRN

## 2017-11-05 MED ORDER — DIVALPROEX SODIUM 500 MG PO DR TAB
500.0000 mg | DELAYED_RELEASE_TABLET | Freq: Three times a day (TID) | ORAL | Status: DC
  Administered 2017-11-05 – 2017-11-08 (×8): 500 mg via ORAL
  Filled 2017-11-05 (×12): qty 1

## 2017-11-05 MED ORDER — FLUPHENAZINE HCL 5 MG PO TABS
5.0000 mg | ORAL_TABLET | Freq: Three times a day (TID) | ORAL | Status: DC
  Administered 2017-11-05 – 2017-11-08 (×8): 5 mg via ORAL
  Filled 2017-11-05 (×12): qty 1

## 2017-11-05 MED ORDER — ASPIRIN EC 81 MG PO TBEC
81.0000 mg | DELAYED_RELEASE_TABLET | Freq: Every day | ORAL | Status: DC
  Administered 2017-11-06 – 2017-11-08 (×3): 81 mg via ORAL
  Filled 2017-11-05 (×3): qty 1

## 2017-11-05 NOTE — Progress Notes (Signed)
Patients tidal volumes were low, lower 200s. Bipap I/E settings were adjusted to increase tidal volume. Tidal volume is now in the upper 200s, lower 300s. Will continue to monitor.

## 2017-11-05 NOTE — Consult Note (Signed)
[facility] Face-to-Face Psychiatry Consult   Reason for Consult: Delirium Referring Physician: Dr Jefferson Fuel Patient Identification: [name] [name] MRN:  [id number] Principal Diagnosis: <principal problem not specified> Diagnosis:   Patient Active Problem List   Diagnosis Date Noted  . Acute and chronic respiratory failure with hypercapnia ([place]) [J96.22] 11/05/2017  . Acute renal failure (ARF) ([place]) [N17.9] 08/05/2017  . ARF (acute renal failure) ([place]) [N17.9] 08/03/2017  . UTI (urinary tract infection) [N39.0] 01/16/2016  . Altered mental status [R41.82] 01/16/2016  . Metabolic encephalopathy [M22.63]   . Acute on chronic respiratory failure with hypoxia and hypercapnia (HCC) [F35.45, J96.22] 10/13/2015  . Involuntary commitment [Z04.6] 09/02/2015  . Schizoaffective disorder, bipolar type ([place]) [F25.0]   . Urinary incontinence [R32] 10/30/2014  . GERD (gastroesophageal reflux disease) [K21.9] 10/30/2014  . OSA (obstructive sleep apnea) [G47.33] 10/30/2014  . Osteoarthrosis, unspecified whether generalized or localized, involving lower leg [M17.10] 10/30/2014  . COPD (chronic obstructive pulmonary disease) ([place]) [J44.9] 10/30/2014  . Chronic diastolic CHF (congestive heart failure) ([place]) [I50.32] 05/15/2014  . Morbid obesity ([place]) [E66.01]   . History of colon polyps [Z86.010] 03/09/2013  . Renal mass [N28.89] 06/26/2011  . 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 Hypertension [I10] 06/24/2011  . Diabetes mellitus ([place]) [E11.9] 06/24/2011    Total Time spent with patient: 1 hour  Subjective:   [name] [name] is a 72 y.o. female patient admitted with .  HPI:  72 y.o.femalewith a known history of morbid obesity, T2IDDM, HTN, HLD, COPD, OSA, chronic diastolic CHF and schizophrenia presenting with acute on chronic hypoxemic hypercapnic respiratory failure. Patient was apparently residing at a facility until ~1wk ago, son took her home. Pt and son apparently had a verbal  disagreement, which precipitated patient's shortness of breath.  Patient is also documented to be confused at home.  UA showed a urinary tract infection.  Psychiatry consulted to evaluate patient's confusion.  Speaking with patient today, she is able to tell me where she is and what the date is.  Is not able to understand that she has a urinary tract infection.  Endorses to me that she has been at a facility called [facility] for a month, however is not able to tell me why she was there at this place.  Endorses that she has been having pain in her buttocks after being wiped forcefully by staff at the center.  Endorses that the area between her buttocks is overall and this causes her a lot of pain.  Patient is oriented to the context of the situation and knows that she is in the hospital.  Is intermittently confused.  Endorses that she has a long-standing history of schizophrenia and has been on medications for a few years.  Medications comprised fluphenazine and Depakote.  Patient endorses that her doctor gives them to her, does not know if this is an outpatient psychiatrist.  UA showed a large number of leukocytes, nitrite negative.  WBC elevated at 15.2.  Endorses that she needs a walker to ambulate.  Past Psychiatric History: Patient endorses that she has been on fluphenazine and Depakote for a few years.  Risk to Self: Is patient at risk for suicide?: No Risk to Others:   Prior Inpatient Therapy:   Prior Outpatient Therapy:    Past Medical History:  Past Medical History:  Diagnosis Date  . Anxiety   . Arthritis   . Bronchitis   . Bursitis   . CAD (coronary artery disease)   . CHF (  congestive heart failure) ([place])   . Chronic cough   . Chronic kidney disease    shadow on x-ray  . COPD (chronic obstructive pulmonary disease) ([place])   . Depression   . DM2 (diabetes mellitus, type 2) ([place])   . Environmental allergies   . GERD (gastroesophageal reflux disease)   . Hypercholesteremia   .  Hypertension   . Left ventricular outflow tract obstruction    a. echo 03/2014: EF 60-65%, hypernamic LV systolic fxn, mod LVH w/ LVOT gradient estimated at 68 mm Hg w/ valsalva, very small LV internal cavity size, mildly increased LV posterior wall thickness, mild Ao valve scl w/o stenosis, diastolic dysfunction, normal RVSP  . Lower extremity edema   . LVH (left ventricular hypertrophy)    a. echo suggests long standing uncontrolled htn. she will not do well when dehydrated, LV cavity obliteration  . Muscle weakness   . Obesity   . On supplemental oxygen therapy    AS NEEDED  . OSA (obstructive sleep apnea)    does not use machine  . Osteoarthritis   . Schizophrenia ([place])   . Tremors of nervous system   . Wheezing     Past Surgical History:  Procedure Laterality Date  . CATARACT EXTRACTION W/PHACO Left 09/10/2014   Procedure: CATARACT EXTRACTION PHACO AND INTRAOCULAR LENS PLACEMENT (IOC);  Surgeon: Birder Robson, MD;  Location: [facility] ORS;  Service: Ophthalmology;  Laterality: Left;  [place]: 00:44 AP%: 22.8 CDE: 10.24 Fluid lot #[id number]  . CATARACT EXTRACTION W/PHACO Right 10/15/2014   Procedure: CATARACT EXTRACTION PHACO AND INTRAOCULAR LENS PLACEMENT (IOC);  Surgeon: Birder Robson, MD;  Location: [facility] ORS;  Service: Ophthalmology;  Laterality: Right;  [place]: 00:52 AP:40.1 CDE:11.83 LOT PACK #[id number]  . CHOLECYSTECTOMY    . COLONOSCOPY  04/2011   [facility] per patient incomplete  . EYE SURGERY    . JOINT REPLACEMENT     TKR  . KNEE RECONSTRUCTION, MEDIAL PATELLAR FEMORAL LIGAMENT    . RENAL BIOPSY    . TONSILLECTOMY    . TONSILLECTOMY     Family History:  Family History  Problem Relation Age of Onset  . Heart attack Mother   . Colon cancer Neg Hx   . Liver disease Neg Hx    Family Psychiatric  History:  Social History:  Social History   Substance and Sexual Activity  Alcohol Use No   Comment: occ.     Social History   Substance and Sexual Activity  Drug Use No     Social History   Socioeconomic History  . Marital status: Widowed    Spouse name: Not on file  . Number of children: 2  . Years of education: Not on file  . Highest education level: Not on file  Occupational History  . Not on file  Social Needs  . Financial resource strain: Not on file  . Food insecurity:    Worry: Not on file    Inability: Not on file  . Transportation needs:    Medical: Not on file    Non-medical: Not on file  Tobacco Use  . Smoking status: Former Smoker    Packs/day: 3.00    Years: 40.00    Pack years: 120.00  . Smokeless tobacco: Never Used  . Tobacco comment: quit in 2009  Substance and Sexual Activity  . Alcohol use: No    Comment: occ.  . Drug use: No  . Sexual activity: Not Currently  Lifestyle  . Physical activity:  Days per week: Not on file    Minutes per session: Not on file  . Stress: Not on file  Relationships  . Social connections:    Talks on phone: Not on file    Gets together: Not on file    Attends religious service: Not on file    Active member of club or organization: Not on file    Attends meetings of clubs or organizations: Not on file    Relationship status: Not on file  Other Topics Concern  . Not on file  Social History Narrative  . Not on file   Additional Social History:    Allergies:   Allergies  Allergen Reactions  . Haldol [Haloperidol Decanoate] Swelling and Other (See Comments)    Reaction:  Swelling of tongue and blurred vision    . Metformin Diarrhea  . Prednisone Other (See Comments)    Unknown reaction  . Raspberry Swelling and Other (See Comments)    Reaction:  Swelling of lips     Labs:  Results for orders placed or performed during the hospital encounter of 11/04/17 (from the past 48 hour(s))  Basic metabolic panel     Status: Abnormal   Collection Time: 11/04/17  8:53 PM  Result Value Ref Range   Sodium 139 135 - 145 mmol/L   Potassium 5.5 (H) 3.5 - 5.1 mmol/L   Chloride 98 98 - 111  mmol/L   CO2 34 (H) 22 - 32 mmol/L   Glucose, Bld 131 (H) 70 - 99 mg/dL   BUN 18 8 - 23 mg/dL   Creatinine, Ser 0.93 0.44 - 1.00 mg/dL   Calcium 9.0 8.9 - 10.3 mg/dL   GFR calc non Af Amer 60 (L) >60 mL/min   GFR calc Af Amer >60 >60 mL/min    Comment: (NOTE) The eGFR has been calculated using the CKD EPI equation. This calculation has not been validated in all clinical situations. eGFR's persistently <60 mL/min signify possible Chronic Kidney Disease.    Anion gap 7 5 - 15    Comment: Performed at [facility], [address] [place], [place] [address]  CBC     Status: Abnormal   Collection Time: 11/04/17  8:53 PM  Result Value Ref Range   WBC 15.2 (H) 3.6 - 11.0 K/uL   RBC 4.77 3.80 - 5.20 MIL/uL   Hemoglobin 14.3 12.0 - 16.0 g/dL   HCT 42.7 35.0 - 47.0 %   MCV 89.5 80.0 - 100.0 fL   MCH 30.0 26.0 - 34.0 pg   MCHC 33.5 32.0 - 36.0 g/dL   RDW 14.7 (H) 11.5 - 14.5 %   Platelets 184 150 - 440 K/uL    Comment: Performed at [facility], [address] [place], [place] [address]  Troponin I     Status: None   Collection Time: 11/04/17  8:53 PM  Result Value Ref Range   Troponin I <0.03 <0.03 ng/mL    Comment: Performed at [facility], [address] [place], [place] [address]  Blood gas, venous     Status: Abnormal (Preliminary result)   Collection Time: 11/04/17 11:57 PM  Result Value Ref Range   pH, Ven 7.36 7.250 - 7.430   pCO2, Ven 78 (HH) 44.0 - 60.0 mmHg    Comment: RBV HENRY, RN AT 0012 ON [id number]    pO2, Ven PENDING 32.0 - 45.0 mmHg   Bicarbonate 44.1 (H) 20.0 - 28.0 mmol/L   Acid-Base Excess 14.7 (  H) 0.0 - 2.0 mmol/L   O2 Saturation PENDING %   Patient temperature 37.0    Collection site VEIN    Sample type VENOUS     Comment: Performed at [facility], [address] [place], [place] [address]  Urinalysis, Complete w Microscopic     Status: Abnormal   Collection Time: 11/05/17  1:14 AM  Result Value Ref Range    Color, Urine YELLOW (A) YELLOW   APPearance HAZY (A) CLEAR   Specific Gravity, Urine 1.014 1.005 - 1.030   pH 7.0 5.0 - 8.0   Glucose, UA NEGATIVE NEGATIVE mg/dL   Hgb urine dipstick MODERATE (A) NEGATIVE   Bilirubin Urine NEGATIVE NEGATIVE   Ketones, ur 5 (A) NEGATIVE mg/dL   Protein, ur 30 (A) NEGATIVE mg/dL   Nitrite NEGATIVE NEGATIVE   Leukocytes, UA LARGE (A) NEGATIVE   RBC / HPF 21-50 0 - 5 RBC/hpf   WBC, UA >50 (H) 0 - 5 WBC/hpf   Bacteria, UA RARE (A) NONE SEEN   Squamous Epithelial / LPF 0-5 0 - 5    Comment: Performed at [facility], [address] [place], [name] [address]  Blood gas, venous     Status: Abnormal (Preliminary result)   Collection Time: 11/05/17  3:13 AM  Result Value Ref Range   FIO2 0.30    Delivery systems BILEVEL POSITIVE AIRWAY PRESSURE    pH, Ven 7.33 7.250 - 7.430   pCO2, Ven 85 (HH) 44.0 - 60.0 mmHg    Comment: RBV WOODS, RN AT 0335 ON [id number]    pO2, Ven PENDING 32.0 - 45.0 mmHg   Bicarbonate 44.8 (H) 20.0 - 28.0 mmol/L   Acid-Base Excess 14.7 (H) 0.0 - 2.0 mmol/L   O2 Saturation PENDING %   Patient temperature 37.0    Collection site VEIN    Sample type VENOUS     Comment: Performed at [facility], [address] [place], [place] [address]  Blood gas, arterial     Status: Abnormal   Collection Time: 11/05/17  4:19 AM  Result Value Ref Range   FIO2 0.30    Delivery systems BILEVEL POSITIVE AIRWAY PRESSURE    LHR 16 resp/min   Inspiratory PAP 14    Expiratory PAP 6    pH, Arterial 7.38 7.350 - 7.450   pCO2 arterial 73 (HH) 32.0 - 48.0 mmHg    Comment: RBV WOODS, RN AT 0437 ON [id number]    pO2, Arterial 76 (L) 83.0 - 108.0 mmHg   Bicarbonate 43.2 (H) 20.0 - 28.0 mmol/L   Acid-Base Excess 14.6 (H) 0.0 - 2.0 mmol/L   O2 Saturation 94.8 %   Patient temperature 37.0    Collection site RIGHT RADIAL    Sample type ARTERIAL DRAW    Allens test (pass/fail) PASS PASS    Comment: Performed at [facility],  [address] [place],  [address]  Glucose, capillary     Status: None   Collection Time: 11/05/17  5:13 AM  Result Value Ref Range   Glucose-Capillary 95 70 - 99 mg/dL  Glucose, capillary     Status: Abnormal   Collection Time: 11/05/17  7:49 AM  Result Value Ref Range   Glucose-Capillary 101 (H) 70 - 99 mg/dL  Lactic acid, plasma     Status: None   Collection Time: 11/05/17 10:50 AM  Result Value Ref Range   Lactic Acid, Venous 1.8 0.5 - 1.9 mmol/L    Comment: Performed  at [facility], [address] [place], [place] [address]  Procalcitonin - Baseline     Status: None   Collection Time: 11/05/17 10:50 AM  Result Value Ref Range   Procalcitonin <0.10 ng/mL    Comment:        Interpretation: PCT (Procalcitonin) <= 0.5 ng/mL: Systemic infection (sepsis) is not likely. Local bacterial infection is possible. (NOTE)       Sepsis PCT Algorithm           Lower Respiratory Tract                                      Infection PCT Algorithm    ----------------------------     ----------------------------         PCT < 0.25 ng/mL                PCT < 0.10 ng/mL         Strongly encourage             Strongly discourage   discontinuation of antibiotics    initiation of antibiotics    ----------------------------     -----------------------------       PCT 0.25 - 0.50 ng/mL            PCT 0.10 - 0.25 ng/mL               OR       >80% decrease in PCT            Discourage initiation of                                            antibiotics      Encourage discontinuation           of antibiotics    ----------------------------     -----------------------------         PCT >= 0.50 ng/mL              PCT 0.26 - 0.50 ng/mL               AND        <80% decrease in PCT             Encourage initiation of                                             antibiotics       Encourage continuation           of antibiotics    ----------------------------      -----------------------------        PCT >= 0.50 ng/mL                  PCT > 0.50 ng/mL               AND         increase in PCT                  Strongly encourage  initiation of antibiotics    Strongly encourage escalation           of antibiotics                                     -----------------------------                                           PCT <= 0.25 ng/mL                                                 OR                                        > 80% decrease in PCT                                     Discontinue / Do not initiate                                             antibiotics Performed at [facility], [address] [place],  [address]   Glucose, capillary     Status: Abnormal   Collection Time: 11/05/17 12:53 PM  Result Value Ref Range   Glucose-Capillary 222 (H) 70 - 99 mg/dL    Current Facility-Administered Medications  Medication Dose Route Frequency Provider Last Rate Last Dose  . acetaminophen (TYLENOL) tablet 650 mg  650 mg Oral Q6H PRN Arta Silence, MD       Or  . acetaminophen (TYLENOL) suppository 650 mg  650 mg Rectal Q6H PRN Arta Silence, MD      . aspirin EC tablet 81 mg  81 mg Oral Daily Jodell Cipro, Aliene Altes, MD      . atorvastatin (LIPITOR) tablet 20 mg  20 mg Oral QHS Arta Silence, MD      . benztropine (COGENTIN) tablet 0.5 mg  0.5 mg Oral BID Arta Silence, MD      . bisacodyl (DULCOLAX) EC tablet 5 mg  5 mg Oral Daily PRN Arta Silence, MD      . carvedilol (COREG) tablet 6.25 mg  6.25 mg Oral BID Arta Silence, MD   6.25 mg at 11/05/17 1552  . [START ON 11/06/2017] cefTRIAXone (ROCEPHIN) 1 g in sodium chloride 0.9 % 100 mL IVPB  1 g Intravenous Q24H Arta Silence, MD      . divalproex (DEPAKOTE) DR tablet 500 mg  500 mg Oral TID Arta Silence, MD      . enoxaparin (LOVENOX) injection 40 mg  40 mg Subcutaneous Q12H Arta Silence, MD   40 mg at 11/05/17 2902  . famotidine (PEPCID) tablet 20 mg  20 mg Oral BID Arta Silence, MD      . fluPHENAZine (PROLIXIN) tablet 5 mg  5 mg Oral TID Arta Silence, MD      . furosemide (LASIX) tablet  20 mg  20 mg Oral Daily Arta Silence, MD   20 mg at 11/05/17 1552  . insulin aspart (novoLOG) injection 0-15 Units  0-15 Units Subcutaneous TID WC Arta Silence, MD   5 Units at 11/05/17 1300  . insulin aspart (novoLOG) injection 0-5 Units  0-5 Units Subcutaneous QHS Sridharan, Prasanna, MD      . insulin aspart (novoLOG) injection 4 Units  4 Units Subcutaneous TID AC Arta Silence, MD   4 Units at 11/05/17 1300  . insulin glargine (LANTUS) injection 11 Units  11 Units Subcutaneous QHS Sridharan, Prasanna, MD      . ipratropium-albuterol (DUONEB) 0.5-2.5 (3) MG/3ML nebulizer solution 3 mL  3 mL Nebulization Q4H PRN Sridharan, Prasanna, MD      . ipratropium-albuterol (DUONEB) 0.5-2.5 (3) MG/3ML nebulizer solution 3 mL  3 mL Nebulization Q6H Arta Silence, MD   3 mL at 11/05/17 1307  . levothyroxine (SYNTHROID, LEVOTHROID) tablet 75 mcg  75 mcg Oral QAC breakfast Arta Silence, MD      . mometasone-formoterol (DULERA) 200-5 MCG/ACT inhaler 2 puff  2 puff Inhalation BID Arta Silence, MD      . multivitamin with minerals tablet 1 tablet  1 tablet Oral Daily Arta Silence, MD      . nitroGLYCERIN (NITROSTAT) SL tablet 0.4 mg  0.4 mg Sublingual Q5 min PRN Arta Silence, MD      . OLANZapine (ZYPREXA) injection 5 mg  5 mg Intramuscular Daily PRN Ramond Dial, MD      . OLANZapine zydis (ZYPREXA) disintegrating tablet 5 mg  5 mg Oral Daily PRN Ramond Dial, MD      . ondansetron (ZOFRAN) tablet 4 mg  4 mg Oral Q6H PRN Arta Silence, MD       Or  . ondansetron (ZOFRAN) injection 4 mg  4 mg Intravenous Q6H PRN Arta Silence, MD      . senna-docusate (Senokot-S) tablet 1 tablet  1 tablet Oral QHS PRN  Arta Silence, MD        Musculoskeletal: Strength & Muscle Tone: Decreased strength in lower extremities, needs a walker to ambulate Gait & Station: unsteady Patient leans: N/A  Psychiatric Specialty Exam: Physical Exam  ROS  Blood pressure (!) 187/112, pulse 65, temperature 98.2 F (36.8 C), temperature source Oral, resp. rate (!) 29, height '5\' 5"'  (1.651 m), weight 129 kg, SpO2 (!) 71 %.Body mass index is 47.33 kg/m.  General Appearance: Disheveled  Eye Contact:  Fair  Speech:  Normal Rate  Volume:  Normal  Mood:  Pleasant and labile  Affect:  Labile  Thought Process:  Coherent  Orientation:  Full (Time, Place, and Person)  Thought Content:  Logical for the most during the interview, occasionally tangential due to confusion  Suicidal Thoughts:  No  Homicidal Thoughts:  No  Memory:  NA  Judgement:  Impaired  Insight: Lacking secondary to confusion  Psychomotor Activity:  Normal  Assets:  Communication Skills Desire for Improvement  ADL's:  Impaired  Cognition: Difficult to ascertain if cognition is intact  Sleep:      72 year old female with a past history of psychosis and multiple medical comorbid conditions presenting with UTI and consequent delirium.  Psychiatry asked to evaluate patient's confusion, patient endorses that she is in pain secondary to possible ulcer over her buttocks.  Is oriented to time place and person, however is unable to understand why she is in the hospital.  Presence of cognitive impairment difficult to tease out because of comorbid delirium.  UA suggestive of unit tract infection which is being treated now.  Discussed with medical team and communicated to them to use Zyprexa this integrating tablet 5 mg p.o. daily as needed with back-up IM injection in case of physical agitation.  The delirium that the patient has is multifactorial could be a combination of a normal baseline and the UTI.  Once the delirium resolves, it would be apparent of  patient's psychosis or affective symptoms need optimization of her pharmacological regimen.  For now continue Depakote and fluphenazine at previous doses.  Of note antipsychotics are associated with increased mortality in the elderly and need to be withdrawn as delirium resolves.  Patient should not be sent home with concomitant antipsychotic dosing.  Treatment Plan Summary: Daily contact with patient to assess and evaluate symptoms and progress in treatment and Medication management  Disposition: Will reevaluate once medically cleared and delirium has resolved.  Ramond Dial, MD 11/05/2017 4:26 PM

## 2017-11-05 NOTE — ED Notes (Signed)
Pt placed on BIPAP

## 2017-11-05 NOTE — ED Provider Notes (Signed)
-----------------------------------------   1:23 AM on 11/05/2017 -----------------------------------------  Assumed care of patient.  CO2 is close to her baseline.  Will place on a trial of BiPAP and repeat VBG.  Awaiting urinalysis.  Disposition pending.  Patient is currently sleeping in no acute distress.   ----------------------------------------- 3:10 AM on 11/05/2017 -----------------------------------------  1 g IV Rocephin ordered for patient's UTI.  She has been on BiPAP for approximately 2 hours.  Will repeat VBG.   ----------------------------------------- 3:43 AM on 11/05/2017 -----------------------------------------  Repeat VBG demonstrates worsening CO2.  Will discuss with hospitalist to evaluate patient in the emergency department for admission.   Paulette Blanch, MD 11/05/17 [phone]

## 2017-11-05 NOTE — Progress Notes (Signed)
Katie, RN charge spoke with pt elevated BP and discussed the importance of taking her medications and pt agreed to take Coreg and lasix, see MAR. Charge RN administered meds. Will continue to monitor.

## 2017-11-05 NOTE — Progress Notes (Signed)
[facility] received a PG from the ICU, [name] [name] is admitted and is requesting spiritual care for emotional/spiritual support. CH presented to the room and was immediately greeted and welcomed in. Pastoral presence ensued, I sat down next to her and we had conversation pursuant to her struggles physically and her ongoing emotional discord. She was tearful but not oppositional or volatile. Prayer was offered and accepted, she asked for a Bible, which was retrieved, and salutations were conferred upon her for continued peace, joy and trusting in the [place].   11/05/17 1600  Clinical Encounter Type  Visited With Patient  Visit Type Initial  Referral From Nurse  Consult/Referral To Chaplain  Spiritual Encounters  Spiritual Needs Prayer;Emotional  Stress Factors  Patient Stress Factors Health changes;Major life changes  Family Stress Factors Major life changes

## 2017-11-05 NOTE — Progress Notes (Signed)
Pt is calm and eating dinner. Will continue to monitor.

## 2017-11-05 NOTE — Consult Note (Signed)
PULMONARY / CRITICAL CARE MEDICINE   NAME:  [name] [name], MRN:  [id number], DOB:  03-22-1945, LOS: 0 ADMISSION DATE:  11/04/2017, CONSULTATION DATE: 11/05/2017 REFERRING MD: Dr. Jodell Cipro.  Reason: Acute hypercarbic respiratory failure  BRIEF HISTORY:    72 year old female with a history of COPD who presented to the ED with shortness of breath, found to be hypercarbic on ABG and placed on BiPAP HISTORY OF PRESENT ILLNESS   This is a 72 year old female with a history of COPD, paranoid schizophrenia, obstructive sleep apnea noncompliant with CPAP, hypertension, CAD and CHF who presented to the ED with shortness of breath after getting into an argument with her son.  Her ABG in the ED showed an elevated CO2 level of 73 and a PO2 of 76%.  UA was abnormal she had a WBC of 15.2.  Her chest x-ray was negative.  She was placed on BiPAP and is being admitted to the ICU for acute hypoxic/hypercarbic respiratory failure, acute COPD exacerbation and  UTI.  SIGNIFICANT PAST MEDICAL HISTORY    Past Medical History:  Diagnosis Date  . Anxiety   . Arthritis   . Bronchitis   . Bursitis   . CAD (coronary artery disease)   . CHF (congestive heart failure) ([place])   . Chronic cough   . Chronic kidney disease    shadow on x-ray  . COPD (chronic obstructive pulmonary disease) ([place])   . Depression   . DM2 (diabetes mellitus, type 2) ([place])   . Environmental allergies   . GERD (gastroesophageal reflux disease)   . Hypercholesteremia   . Hypertension   . Left ventricular outflow tract obstruction    a. echo 03/2014: EF 60-65%, hypernamic LV systolic fxn, mod LVH w/ LVOT gradient estimated at 68 mm Hg w/ valsalva, very small LV internal cavity size, mildly increased LV posterior wall thickness, mild Ao valve scl w/o stenosis, diastolic dysfunction, normal RVSP  . Lower extremity edema   . LVH (left ventricular hypertrophy)    a. echo suggests long standing uncontrolled htn. she will not do well when  dehydrated, LV cavity obliteration  . Muscle weakness   . Obesity   . On supplemental oxygen therapy    AS NEEDED  . OSA (obstructive sleep apnea)    does not use machine  . Osteoarthritis   . Schizophrenia ([place])   . Tremors of nervous system   . Wheezing     SIGNIFICANT EVENTS:  11/05/17: Admitted STUDIES:   None CULTURES:  Urine culture  ANTIBIOTICS:  Ceftriaxone  LINES/TUBES:   Peripheral IVs CONSULTANTS:  None SUBJECTIVE:  On BiPAP, requesting to go home  CONSTITUTIONAL: BP (!) 154/113   Pulse 74   Temp 98.2 F (36.8 C) (Oral)   Resp (!) 22   Ht 5\' 5"  (1.651 m)   Wt 129 kg   SpO2 100%   BMI 47.33 kg/m   No intake/output data recorded.     FiO2 (%):  [30 %] 30 %  PHYSICAL EXAM: General: Awake, moderate respiratory distress Neuro: Alert to person only, follows basic commands, moves all extremities HEENT: PERRLA, trachea midline, no JVD Cardiovascular: Apical pulse regular, S1-S2, no murmur regurg or gallop, +2 pulses bilaterally Lungs: Clear to auscultation bilaterally with significantly diminished breath sounds in the bases  Abdomen: Obese, normal bowel sounds in all 4 quadrants, no organomegaly on palpation Musculoskeletal: Positive range of motion, no deformities Skin: Warm and dry  ASSESSMENT   Acute hypoxic/hypercarbic respiratory failure Acute COPD exacerbation Urinary tract  infection Acute encephalopathy due to above Schizophrenia  PLAN Continues BiPAP and titrate to nasal cannula as tolerated Nebulized bronchodilators and steroids Antibiotics as above Gentle IV hydration Resume all home medications Start psychiatric consult for inpatient management of schizophrenia while hospitalized  SUMMARY OF TODAY'S PLAN:  BiPAP, antibiotics, and psychiatry consult  Best Practice / Goals of Care / Disposition.   DVT PROPHYLAXIS: Subcu Lovenox SUP: Pepcid 20 mg twice a day NUTRITION: Heart healthy diet MOBILITY: Out of bed to chair as  tolerated GOALS OF CARE: Full code no limitations FAMILY DISCUSSIONS: No family at bedside DISPOSITION: Continue to manage in the ICU  LABS  Glucose Recent Labs  Lab 11/05/17 0513  GLUCAP 95    BMET Recent Labs  Lab 11/04/17 2053  NA 139  K 5.5*  CL 98  CO2 34*  BUN 18  CREATININE 0.93  GLUCOSE 131*    Liver Enzymes No results for input(s): AST, ALT, ALKPHOS, BILITOT, ALBUMIN in the last 168 hours.  Electrolytes Recent Labs  Lab 11/04/17 2053  CALCIUM 9.0    CBC Recent Labs  Lab 11/04/17 2053  WBC 15.2*  HGB 14.3  HCT 42.7  PLT 184    ABG Recent Labs  Lab 11/05/17 0419  PHART 7.38  PCO2ART 73*  PO2ART 76*    Coag's No results for input(s): APTT, INR in the last 168 hours.  Sepsis Markers No results for input(s): LATICACIDVEN, PROCALCITON, O2SATVEN in the last 168 hours.  Cardiac Enzymes Recent Labs  Lab 11/04/17 2053  TROPONINI <0.03    PAST MEDICAL HISTORY :   She  has a past medical history of Anxiety, Arthritis, Bronchitis, Bursitis, CAD (coronary artery disease), CHF (congestive heart failure) ([place]), Chronic cough, Chronic kidney disease, COPD (chronic obstructive pulmonary disease) ([place]), Depression, DM2 (diabetes mellitus, type 2) ([place]), Environmental allergies, GERD (gastroesophageal reflux disease), Hypercholesteremia, Hypertension, Left ventricular outflow tract obstruction, Lower extremity edema, LVH (left ventricular hypertrophy), Muscle weakness, Obesity, On supplemental oxygen therapy, OSA (obstructive sleep apnea), Osteoarthritis, Schizophrenia ([place]), Tremors of nervous system, and Wheezing.  PAST SURGICAL HISTORY:  She  has a past surgical history that includes Tonsillectomy; Knee reconstruction, medial patellar femoral ligament; Cholecystectomy; Colonoscopy (04/2011); Tonsillectomy; Cataract extraction w/PHACO (Left, 09/10/2014); Joint replacement; Eye surgery; Cataract extraction w/PHACO (Right, 10/15/2014); and Renal  biopsy.  Allergies  Allergen Reactions  . Haldol [Haloperidol Decanoate] Swelling and Other (See Comments)    Reaction:  Swelling of tongue and blurred vision    . Metformin Diarrhea  . Prednisone Other (See Comments)    Unknown reaction  . Raspberry Swelling and Other (See Comments)    Reaction:  Swelling of lips     No current facility-administered medications on file prior to encounter.    Current Outpatient Medications on File Prior to Encounter  Medication Sig  . acetaminophen (TYLENOL) 325 MG tablet Take 2 tablets (650 mg total) by mouth every 4 (four) hours as needed for mild pain (temp > 101.5).  Marland Kitchen albuterol (PROVENTIL HFA;VENTOLIN HFA) 108 (90 Base) MCG/ACT inhaler Inhale 2 puffs into the lungs every 6 (six) hours as needed for wheezing or shortness of breath.  . Amino Acids-Protein Hydrolys (FEEDING SUPPLEMENT, PRO-STAT SUGAR FREE 64,) LIQD Take 30 mLs by mouth 2 (two) times daily.  Marland Kitchen aspirin EC 81 MG tablet Take 81 mg by mouth daily.  Marland Kitchen atorvastatin (LIPITOR) 20 MG tablet Take 20 mg by mouth at bedtime.  . benztropine (COGENTIN) 0.5 MG tablet Take 0.5 mg by mouth 2 (two)  times daily.  . carvedilol (COREG) 6.25 MG tablet Take 1 tablet (6.25 mg total) by mouth 2 (two) times daily.  . cetirizine (ZYRTEC) 10 MG tablet Take 10 mg by mouth daily.  . cholecalciferol (VITAMIN D) 1000 units tablet Take 1,000 Units by mouth daily.  . divalproex (DEPAKOTE) 500 MG DR tablet Take 1 tablet (500 mg total) by mouth 3 (three) times daily.  Marland Kitchen docusate sodium (COLACE) 100 MG capsule Take 200 mg by mouth daily.  . fluPHENAZine (PROLIXIN) 5 MG/ML solution Take 5 mg by mouth 3 (three) times daily.  . fluPHENAZine decanoate (PROLIXIN) 25 MG/ML injection Inject 2 mLs (50 mg total) into the muscle every 14 (fourteen) days.  . Fluticasone-Salmeterol (ADVAIR) 250-50 MCG/DOSE AEPB Inhale 1 puff into the lungs 2 (two) times daily.  . furosemide (LASIX) 20 MG tablet Take 1 tablet (20 mg total) by mouth  daily.  Marland Kitchen glipiZIDE (GLUCOTROL) 10 MG tablet Take 1 tablet by mouth daily.  Marland Kitchen guaiFENesin (ROBITUSSIN) 100 MG/5ML SOLN Take 15 mLs by mouth every 6 (six) hours as needed for cough or to loosen phlegm. *Notify physician if cough persists more than 3 days*  . insulin aspart (NOVOLOG) 100 UNIT/ML injection Inject 4 Units into the skin 3 (three) times daily before meals.  . insulin glargine (LANTUS) 100 UNIT/ML injection Inject 11 Units into the skin at bedtime.   Marland Kitchen levothyroxine (SYNTHROID, LEVOTHROID) 75 MCG tablet Take 1 tablet by mouth daily.  . magnesium hydroxide (MILK OF MAGNESIA) 400 MG/5ML suspension Take 30 mLs by mouth daily as needed for mild constipation.  . Multiple Vitamin (MULTIVITAMIN WITH MINERALS) TABS tablet Take 1 tablet by mouth daily.  . nitroGLYCERIN (NITROSTAT) 0.4 MG SL tablet Place 0.4 mg under the tongue every 5 (five) minutes as needed for chest pain.  . polyethylene glycol (MIRALAX / GLYCOLAX) packet Take 17 g by mouth daily as needed for mild constipation.  . ranitidine (ZANTAC) 150 MG tablet Take 150 mg by mouth 2 (two) times daily.  . Skin Protectants, Misc. (EUCERIN) cream Apply 1 application topically daily.  Marland Kitchen tiotropium (SPIRIVA) 18 MCG inhalation capsule Place 1 capsule (18 mcg total) into inhaler and inhale daily.    FAMILY HISTORY:   Her family history includes Heart attack in her mother. There is no history of Colon cancer or Liver disease.  SOCIAL HISTORY:  She  reports that she has quit smoking. She has a 120.00 pack-year smoking history. She has never used smokeless tobacco. She reports that she does not drink alcohol or use drugs.  REVIEW OF SYSTEMS:    Unable to obtain as patient is on continuous BiPAP

## 2017-11-05 NOTE — H&P (Signed)
[facility] [place] NAME: [name] [name]    MR#:  [id number]  DATE OF BIRTH:  1945-04-23  DATE OF ADMISSION:  11/04/2017  PRIMARY CARE PHYSICIAN: Patient, No Pcp Per   REQUESTING/REFERRING PHYSICIAN: Paulette Blanch, MD  CHIEF COMPLAINT:   Chief Complaint  Patient presents with  . Shortness of Breath    HISTORY OF PRESENT ILLNESS:  [name] [name]  is a 72 y.o. female with a known history of morbid obesity, T2IDDM, HTN, HLD, COPD, OSA, chronic diastolic CHF (EF > 34%, grade 1 diastolic dysfxn as of 74/25/9563 Echo), psychosis p/w CP/SOB, acute on chronic hypoxemic hypercapnoeic respiratory failure. Pt on BiPAP at the time of my assessment, lethargic, unable to stay awake or answer questions, Hx/ROS unobtainable from pt. Per ED provider, staff and documentation, pt was apparently residing at a facility until ~1wk ago, at which time her son took her home. Pt and son apparently had a verbal disagreement, which precipitated pt's CP/SOB. Pt also documented to be "weak" and "confused" at home. EMS called. At the time of my assessment, pt appears to be protecting her airway. Her lung sounds are diminished but clear. CXR (-) active cardiopulmonary disease. ABG demonstrates pH 7.38, pCO2 73, paO2 76. Her CO2 level is likely at/near baseline. She does not appear to be having active clinical COPD or CHF exacerbation. U/A (+) UTI.  PAST MEDICAL HISTORY:   Past Medical History:  Diagnosis Date  . Anxiety   . Arthritis   . Bronchitis   . Bursitis   . CAD (coronary artery disease)   . CHF (congestive heart failure) ([place])   . Chronic cough   . Chronic kidney disease    shadow on x-ray  . COPD (chronic obstructive pulmonary disease) ([place])   . Depression   . DM2 (diabetes mellitus, type 2) ([place])   . Environmental allergies   . GERD (gastroesophageal reflux disease)   . Hypercholesteremia   . Hypertension   . Left ventricular outflow tract obstruction    a. echo 03/2014: EF 60-65%, hypernamic LV systolic fxn, mod LVH w/ LVOT gradient estimated at 68 mm Hg w/ valsalva, very small LV internal cavity size, mildly increased LV posterior wall thickness, mild Ao valve scl w/o stenosis, diastolic dysfunction, normal RVSP  . Lower extremity edema   . LVH (left ventricular hypertrophy)    a. echo suggests long standing uncontrolled htn. she will not do well when dehydrated, LV cavity obliteration  . Muscle weakness   . Obesity   . On supplemental oxygen therapy    AS NEEDED  . OSA (obstructive sleep apnea)    does not use machine  . Osteoarthritis   . Schizophrenia ([place])   . Tremors of nervous system   . Wheezing     PAST SURGICAL HISTORY:   Past Surgical History:  Procedure Laterality Date  . CATARACT EXTRACTION W/PHACO Left 09/10/2014   Procedure: CATARACT EXTRACTION PHACO AND INTRAOCULAR LENS PLACEMENT (IOC);  Surgeon: Birder Robson, MD;  Location: [facility] ORS;  Service: Ophthalmology;  Laterality: Left;  [place]: 00:44 AP%: 22.8 CDE: 10.24 Fluid lot #[id number]  . CATARACT EXTRACTION W/PHACO Right 10/15/2014   Procedure: CATARACT EXTRACTION PHACO AND INTRAOCULAR LENS PLACEMENT (IOC);  Surgeon: Birder Robson, MD;  Location: [facility] ORS;  Service: Ophthalmology;  Laterality: Right;  [place]: 00:52 AP:40.1 CDE:11.83 LOT PACK #[id number]  . CHOLECYSTECTOMY    . COLONOSCOPY  04/2011   [facility] per patient incomplete  . EYE SURGERY    .  JOINT REPLACEMENT     TKR  . KNEE RECONSTRUCTION, MEDIAL PATELLAR FEMORAL LIGAMENT    . RENAL BIOPSY    . TONSILLECTOMY    . TONSILLECTOMY      SOCIAL HISTORY:   Social History   Tobacco Use  . Smoking status: Former Smoker    Packs/day: 3.00    Years: 40.00    Pack years: 120.00  . Smokeless tobacco: Never Used  . Tobacco comment: quit in 2009  Substance Use Topics  . Alcohol use: No    Comment: occ.    FAMILY HISTORY:   Family History  Problem Relation Age of Onset  . Heart attack Mother   . Colon  cancer Neg Hx   . Liver disease Neg Hx     DRUG ALLERGIES:   Allergies  Allergen Reactions  . Haldol [Haloperidol Decanoate] Swelling and Other (See Comments)    Reaction:  Swelling of tongue and blurred vision    . Metformin Diarrhea  . Prednisone Other (See Comments)    Unknown reaction  . Raspberry Swelling and Other (See Comments)    Reaction:  Swelling of lips     REVIEW OF SYSTEMS:   Review of Systems  Unable to perform ROS: Patient nonverbal  Respiratory: Positive for shortness of breath.   Cardiovascular: Positive for chest pain.  Neurological: Positive for weakness.   On BiPAP, lethargic, non-verbal, transiently arousable but poorly-responsive. (+) Hx psychosis. Protecting airway. MEDICATIONS AT HOME:   Prior to Admission medications   Medication Sig Start Date End Date Taking? Authorizing Provider  acetaminophen (TYLENOL) 325 MG tablet Take 2 tablets (650 mg total) by mouth every 4 (four) hours as needed for mild pain (temp > 101.5). 10/20/15   Gladstone Lighter, MD  albuterol (PROVENTIL HFA;VENTOLIN HFA) 108 (90 Base) MCG/ACT inhaler Inhale 2 puffs into the lungs every 6 (six) hours as needed for wheezing or shortness of breath.    [provider]  Amino Acids-Protein Hydrolys (FEEDING SUPPLEMENT, PRO-STAT SUGAR FREE 64,) LIQD Take 30 mLs by mouth 2 (two) times daily.    [provider]  aspirin EC 81 MG tablet Take 81 mg by mouth daily.    [provider]  atorvastatin (LIPITOR) 20 MG tablet Take 20 mg by mouth at bedtime.    [provider]  benztropine (COGENTIN) 0.5 MG tablet Take 0.5 mg by mouth 2 (two) times daily.    [provider]  carvedilol (COREG) 6.25 MG tablet Take 1 tablet (6.25 mg total) by mouth 2 (two) times daily. 08/15/17   Loletha Grayer, MD  cetirizine (ZYRTEC) 10 MG tablet Take 10 mg by mouth daily.    [provider]  cholecalciferol (VITAMIN D) 1000 units tablet Take 1,000 Units by mouth  daily.    [provider]  divalproex (DEPAKOTE) 500 MG DR tablet Take 1 tablet (500 mg total) by mouth 3 (three) times daily. 08/15/17   Loletha Grayer, MD  docusate sodium (COLACE) 100 MG capsule Take 200 mg by mouth daily.    [provider]  fluPHENAZine (PROLIXIN) 5 MG/ML solution Take 5 mg by mouth 3 (three) times daily.    [provider]  fluPHENAZine decanoate (PROLIXIN) 25 MG/ML injection Inject 2 mLs (50 mg total) into the muscle every 14 (fourteen) days. 08/25/17   Loletha Grayer, MD  Fluticasone-Salmeterol (ADVAIR) 250-50 MCG/DOSE AEPB Inhale 1 puff into the lungs 2 (two) times daily.    [provider]  furosemide (LASIX) 20 MG tablet  Take 1 tablet (20 mg total) by mouth daily. 08/15/17   Loletha Grayer, MD  glipiZIDE (GLUCOTROL) 10 MG tablet Take 1 tablet by mouth daily. 04/06/17   [provider]  guaiFENesin (ROBITUSSIN) 100 MG/5ML SOLN Take 15 mLs by mouth every 6 (six) hours as needed for cough or to loosen phlegm. *Notify physician if cough persists more than 3 days*    [provider]  insulin aspart (NOVOLOG) 100 UNIT/ML injection Inject 4 Units into the skin 3 (three) times daily before meals. 08/15/17   Loletha Grayer, MD  insulin glargine (LANTUS) 100 UNIT/ML injection Inject 11 Units into the skin at bedtime.     [provider]  levothyroxine (SYNTHROID, LEVOTHROID) 75 MCG tablet Take 1 tablet by mouth daily. 04/05/17   [provider]  magnesium hydroxide (MILK OF MAGNESIA) 400 MG/5ML suspension Take 30 mLs by mouth daily as needed for mild constipation.    [provider]  Multiple Vitamin (MULTIVITAMIN WITH MINERALS) TABS tablet Take 1 tablet by mouth daily.    [provider]  nitroGLYCERIN (NITROSTAT) 0.4 MG SL tablet Place 0.4 mg under the tongue every 5 (five) minutes as needed for chest pain.    [provider]  polyethylene glycol (MIRALAX / GLYCOLAX) packet Take 17 g  by mouth daily as needed for mild constipation. 08/15/17   Loletha Grayer, MD  ranitidine (ZANTAC) 150 MG tablet Take 150 mg by mouth 2 (two) times daily.    [provider]  Skin Protectants, Misc. (EUCERIN) cream Apply 1 application topically daily.    [provider]  tiotropium (SPIRIVA) 18 MCG inhalation capsule Place 1 capsule (18 mcg total) into inhaler and inhale daily. 07/02/14   [name], MD      VITAL SIGNS:  Blood pressure (!) 154/113, pulse 74, temperature 98.2 F (36.8 C), temperature source Oral, resp. rate (!) 22, height 5\' 5"  (1.651 m), weight 129 kg, SpO2 100 %.  PHYSICAL EXAMINATION:  Physical Exam  Constitutional: She appears well-developed and well-nourished. She appears lethargic. She is sleeping.  Non-toxic appearance. She has a sickly appearance. She appears ill. She is not intubated.  HENT:  Head: Atraumatic.  Mouth/Throat: Oropharynx is clear and moist. No oropharyngeal exudate.  Eyes: Conjunctivae, EOM and lids are normal. No scleral icterus.  Neck: Neck supple. No JVD present. No thyromegaly present.  Cardiovascular: Normal rate, regular rhythm, S1 normal and S2 normal.  No extrasystoles are present. Exam reveals no gallop, no S3, no S4, no distant heart sounds and no friction rub.  Murmur heard.  Systolic murmur is present with a grade of 2/6. Pulmonary/Chest: No accessory muscle usage or stridor. Tachypnea noted. No apnea and no bradypnea. She is not intubated. She has decreased breath sounds in the right upper field, the right middle field, the right lower field, the left upper field, the left middle field and the left lower field. She has no wheezes. She has no rhonchi. She has no rales.  Abdominal: Soft. Bowel sounds are normal. She exhibits no distension. There is no tenderness. There is no rebound and no guarding.  Musculoskeletal: She exhibits no edema or tenderness.       Right lower leg: She exhibits no tenderness and no edema.         Left lower leg: She exhibits no tenderness and no edema.  Lymphadenopathy:    She has no cervical adenopathy.  Neurological: She appears lethargic.  Skin: Skin is warm and dry. No rash  noted. She is not diaphoretic. No erythema. No pallor.  Psychiatric:  On BiPAP, lethargic.   LABORATORY PANEL:   CBC Recent Labs  Lab 11/04/17 2053  WBC 15.2*  HGB 14.3  HCT 42.7  PLT 184   ------------------------------------------------------------------------------------------------------------------  Chemistries  Recent Labs  Lab 11/04/17 2053  NA 139  K 5.5*  CL 98  CO2 34*  GLUCOSE 131*  BUN 18  CREATININE 0.93  CALCIUM 9.0   ------------------------------------------------------------------------------------------------------------------  Cardiac Enzymes Recent Labs  Lab 11/04/17 2053  TROPONINI <0.03   ------------------------------------------------------------------------------------------------------------------  RADIOLOGY:  Dg Chest 2 View  Result Date: 11/04/2017 CLINICAL DATA:  Shortness of breath. EXAM: CHEST - 2 VIEW COMPARISON:  08/14/2017 FINDINGS: Lungs are adequately inflated without focal airspace consolidation or effusion. Stable moderate cardiomegaly. Remainder of the exam is unchanged. IMPRESSION: No active cardiopulmonary disease. Stable cardiomegaly. Electronically Signed   By: Marin Olp M.D.   On: 11/04/2017 21:02   IMPRESSION AND PLAN:   A/P: 51F p/w CP/SOB, acute on chronic hypoxemic hypercapnoeic respiratory failure. U/A (+) UTI, WBC 15.2, tachypneic + hypoxic, SIRS (+), technically (+) sepsis. Hyperkalemia, hyperglycemia (w/ T2IDDM), leukocytosis. -CP/SOB, acute on chronic hypoxemic hypercapnoeic respiratory failure: Initial symptomatology thought to be 2/2 argument w/ son. Trop-I (-), EKG (-) acute ST elevation. Lethargic on my assessment, but protecting airway. Lung sounds diminished but clear. CXR (-) active cardiopulmonary disease. ABG pH  7.38, pCO2 73, paO2 76. CO2 likely at/near baseline. c/w LABA/ICS and diuretics at home dose. Not clinically in COPD or CHF exacerbation. BiPAP. DuoNebs q6h + q4h PRN. Incentive spirometry, pulmonary toileting. Not ordered for additional steroids or diuretics. Possible component of OSA, OHS/[place], restrictive disease 2/2 body habitus. -UTI, leukocytosis, SIRS, sepsis: U/A (+) Hgb, leuk est, protein, bacteria, RBC, WBC. Afebrile, (+) leukocytosis (WBC 15.2), tachypneic + hypoxic, SIRS (+), meets technical criteria for sepsis. Sepsis protocol not initiated in ED. Already started on ABx (Ceftriaxone). Not ordered for BCx, yield expected to be low given ABx already started. Lactic acid, PCT, UCx pending. -Hyperkalemia: K+ 5.5 on admission. No EKG changes. Has received nebulizer treatments. Monitor BMP. -Hyperglycemia/T2IDDM: SSI, c/w home insulin, hold PO antihyperglycemics. -c/w other home meds. -FEN/GI: Cardiac diabetic diet as tolerated. -DVT PPx: Lovenox. -Code status: Full code. -Disposition: Admission, > 2 midnights.   All the records are reviewed and case discussed with ED provider. Management plans discussed with the patient, family and they are in agreement.  CODE STATUS: Full code.  TOTAL TIME TAKING CARE OF THIS PATIENT: 90 minutes.    Arta Silence M.D on 11/05/2017 at 6:20 AM  Between 7am to 6pm - Pager - [phone]  After 6pm go to [email or web address] - Proofreader  [facility]  Office  [phone]  CC: Primary care physician; Patient, No Pcp Per   Note: This dictation was prepared with Dragon dictation along with smaller phrase technology. Any transcriptional errors that result from this process are unintentional.

## 2017-11-05 NOTE — Progress Notes (Signed)
Received 59 yrs female from ED to room 18, pt is A/Ox2, disoriented to time. Pt on Bipap at 30%, VSS, pt agitated  And called room " this is room of death", pt was reassured  And oriented to room and call light. Denies any pain at this time.

## 2017-11-05 NOTE — ED Notes (Signed)
Dr Beather Arbour made aware at this time of pt's critical venous pCO2 level as reported by RT= pCO2 85 mmHg

## 2017-11-05 NOTE — Consult Note (Signed)
Reason for Consult:Shortness of Breath  Referring Physician: Hospitalist Service  [name] [name] is an 72 y.o. female.  HPI: patient yelling and agitated not willing to provide history. History taken from notes in the chart.  [name] [name]  is a 72 y.o. female with a known history of morbid obesity, T2IDDM, HTN, HLD, COPD, OSA, chronic diastolic CHF (EF > 29%, grade 1 diastolic dysfxn as of 52/84/1324 Echo), psychosis p/w CP/SOB, acute on chronic hypoxemic hypercapnic respiratory failure. Patient was apparently residing at a facility until ~1wk ago, son took her home. Pt and son apparently had a verbal disagreement, which precipitated pt's CP/SOB. Pt also documented to be "weak" and "confused" at home. EMS called. CXR (-) active cardiopulmonary disease. ABG demonstrates pH 7.38, pCO2 73, paO2 76. Her CO2 level is likely at/near baseline. She does not appear to be having active clinical COPD or CHF exacerbation. U/A (+) UTI This morning patient is yelling and agitated, stating she wants to go home. No increased work of breathing or perceived discomfort.   Past Medical History:  Diagnosis Date  . Anxiety   . Arthritis   . Bronchitis   . Bursitis   . CAD (coronary artery disease)   . CHF (congestive heart failure) ([place])   . Chronic cough   . Chronic kidney disease    shadow on x-ray  . COPD (chronic obstructive pulmonary disease) ([place])   . Depression   . DM2 (diabetes mellitus, type 2) ([place])   . Environmental allergies   . GERD (gastroesophageal reflux disease)   . Hypercholesteremia   . Hypertension   . Left ventricular outflow tract obstruction    a. echo 03/2014: EF 60-65%, hypernamic LV systolic fxn, mod LVH w/ LVOT gradient estimated at 68 mm Hg w/ valsalva, very small LV internal cavity size, mildly increased LV posterior wall thickness, mild Ao valve scl w/o stenosis, diastolic dysfunction, normal RVSP  . Lower extremity edema   . LVH (left ventricular hypertrophy)    a. echo  suggests long standing uncontrolled htn. she will not do well when dehydrated, LV cavity obliteration  . Muscle weakness   . Obesity   . On supplemental oxygen therapy    AS NEEDED  . OSA (obstructive sleep apnea)    does not use machine  . Osteoarthritis   . Schizophrenia ([place])   . Tremors of nervous system   . Wheezing     Past Surgical History:  Procedure Laterality Date  . CATARACT EXTRACTION W/PHACO Left 09/10/2014   Procedure: CATARACT EXTRACTION PHACO AND INTRAOCULAR LENS PLACEMENT (IOC);  Surgeon: Birder Robson, MD;  Location: [facility] ORS;  Service: Ophthalmology;  Laterality: Left;  [place]: 00:44 AP%: 22.8 CDE: 10.24 Fluid lot #[id number]  . CATARACT EXTRACTION W/PHACO Right 10/15/2014   Procedure: CATARACT EXTRACTION PHACO AND INTRAOCULAR LENS PLACEMENT (IOC);  Surgeon: Birder Robson, MD;  Location: [facility] ORS;  Service: Ophthalmology;  Laterality: Right;  [place]: 00:52 AP:40.1 CDE:11.83 LOT PACK #[id number]  . CHOLECYSTECTOMY    . COLONOSCOPY  04/2011   [facility] per patient incomplete  . EYE SURGERY    . JOINT REPLACEMENT     TKR  . KNEE RECONSTRUCTION, MEDIAL PATELLAR FEMORAL LIGAMENT    . RENAL BIOPSY    . TONSILLECTOMY    . TONSILLECTOMY      Family History  Problem Relation Age of Onset  . Heart attack Mother   . Colon cancer Neg Hx   . Liver disease Neg Hx     Social History:  reports that she has quit smoking. She has a 120.00 pack-year smoking history. She has never used smokeless tobacco. She reports that she does not drink alcohol or use drugs.  Allergies:  Allergies  Allergen Reactions  . Haldol [Haloperidol Decanoate] Swelling and Other (See Comments)    Reaction:  Swelling of tongue and blurred vision    . Metformin Diarrhea  . Prednisone Other (See Comments)    Unknown reaction  . Raspberry Swelling and Other (See Comments)    Reaction:  Swelling of lips     Medications: I have reviewed the patient's current medications.  Results for orders placed or  performed during the hospital encounter of 11/04/17 (from the past 48 hour(s))  Basic metabolic panel     Status: Abnormal   Collection Time: 11/04/17  8:53 PM  Result Value Ref Range   Sodium 139 135 - 145 mmol/L   Potassium 5.5 (H) 3.5 - 5.1 mmol/L   Chloride 98 98 - 111 mmol/L   CO2 34 (H) 22 - 32 mmol/L   Glucose, Bld 131 (H) 70 - 99 mg/dL   BUN 18 8 - 23 mg/dL   Creatinine, Ser 0.93 0.44 - 1.00 mg/dL   Calcium 9.0 8.9 - 10.3 mg/dL   GFR calc non Af Amer 60 (L) >60 mL/min   GFR calc Af Amer >60 >60 mL/min    Comment: (NOTE) The eGFR has been calculated using the CKD EPI equation. This calculation has not been validated in all clinical situations. eGFR's persistently <60 mL/min signify possible Chronic Kidney Disease.    Anion gap 7 5 - 15    Comment: Performed at [facility], [address] [place], [place] [address]  CBC     Status: Abnormal   Collection Time: 11/04/17  8:53 PM  Result Value Ref Range   WBC 15.2 (H) 3.6 - 11.0 K/uL   RBC 4.77 3.80 - 5.20 MIL/uL   Hemoglobin 14.3 12.0 - 16.0 g/dL   HCT 42.7 35.0 - 47.0 %   MCV 89.5 80.0 - 100.0 fL   MCH 30.0 26.0 - 34.0 pg   MCHC 33.5 32.0 - 36.0 g/dL   RDW 14.7 (H) 11.5 - 14.5 %   Platelets 184 150 - 440 K/uL    Comment: Performed at [facility], [address] [place], [place] [address]  Troponin I     Status: None   Collection Time: 11/04/17  8:53 PM  Result Value Ref Range   Troponin I <0.03 <0.03 ng/mL    Comment: Performed at [facility], [address] [place], [place] [address]  Blood gas, venous     Status: Abnormal (Preliminary result)   Collection Time: 11/04/17 11:57 PM  Result Value Ref Range   pH, Ven 7.36 7.250 - 7.430   pCO2, Ven 78 (HH) 44.0 - 60.0 mmHg    Comment: RBV HENRY, RN AT 0012 ON [id number]    pO2, Ven PENDING 32.0 - 45.0 mmHg   Bicarbonate 44.1 (H) 20.0 - 28.0 mmol/L   Acid-Base Excess 14.7 (H) 0.0 - 2.0 mmol/L   O2 Saturation PENDING %   Patient  temperature 37.0    Collection site VEIN    Sample type VENOUS     Comment: Performed at [facility], [address] [place], [place] [address]  Urinalysis, Complete w Microscopic     Status: Abnormal   Collection Time: 11/05/17  1:14 AM  Result Value Ref Range   Color, Urine  YELLOW (A) YELLOW   APPearance HAZY (A) CLEAR   Specific Gravity, Urine 1.014 1.005 - 1.030   pH 7.0 5.0 - 8.0   Glucose, UA NEGATIVE NEGATIVE mg/dL   Hgb urine dipstick MODERATE (A) NEGATIVE   Bilirubin Urine NEGATIVE NEGATIVE   Ketones, ur 5 (A) NEGATIVE mg/dL   Protein, ur 30 (A) NEGATIVE mg/dL   Nitrite NEGATIVE NEGATIVE   Leukocytes, UA LARGE (A) NEGATIVE   RBC / HPF 21-50 0 - 5 RBC/hpf   WBC, UA >50 (H) 0 - 5 WBC/hpf   Bacteria, UA RARE (A) NONE SEEN   Squamous Epithelial / LPF 0-5 0 - 5    Comment: Performed at [facility], [address] [place], [place] [address]  Blood gas, venous     Status: Abnormal (Preliminary result)   Collection Time: 11/05/17  3:13 AM  Result Value Ref Range   FIO2 0.30    Delivery systems BILEVEL POSITIVE AIRWAY PRESSURE    pH, Ven 7.33 7.250 - 7.430   pCO2, Ven 85 (HH) 44.0 - 60.0 mmHg    Comment: RBV WOODS, RN AT 0335 ON [id number]    pO2, Ven PENDING 32.0 - 45.0 mmHg   Bicarbonate 44.8 (H) 20.0 - 28.0 mmol/L   Acid-Base Excess 14.7 (H) 0.0 - 2.0 mmol/L   O2 Saturation PENDING %   Patient temperature 37.0    Collection site VEIN    Sample type VENOUS     Comment: Performed at [facility], [address] [place], [place] [address]  Blood gas, arterial     Status: Abnormal   Collection Time: 11/05/17  4:19 AM  Result Value Ref Range   FIO2 0.30    Delivery systems BILEVEL POSITIVE AIRWAY PRESSURE    LHR 16 resp/min   Inspiratory PAP 14    Expiratory PAP 6    pH, Arterial 7.38 7.350 - 7.450   pCO2 arterial 73 (HH) 32.0 - 48.0 mmHg    Comment: RBV WOODS, RN AT 0437 ON [id number]    pO2, Arterial 76 (L) 83.0 - 108.0 mmHg    Bicarbonate 43.2 (H) 20.0 - 28.0 mmol/L   Acid-Base Excess 14.6 (H) 0.0 - 2.0 mmol/L   O2 Saturation 94.8 %   Patient temperature 37.0    Collection site RIGHT RADIAL    Sample type ARTERIAL DRAW    Allens test (pass/fail) PASS PASS    Comment: Performed at [facility], [address] [place], [place] [address]  Glucose, capillary     Status: None   Collection Time: 11/05/17  5:13 AM  Result Value Ref Range   Glucose-Capillary 95 70 - 99 mg/dL  Glucose, capillary     Status: Abnormal   Collection Time: 11/05/17  7:49 AM  Result Value Ref Range   Glucose-Capillary 101 (H) 70 - 99 mg/dL    Dg Chest 2 View  Result Date: 11/04/2017 CLINICAL DATA:  Shortness of breath. EXAM: CHEST - 2 VIEW COMPARISON:  08/14/2017 FINDINGS: Lungs are adequately inflated without focal airspace consolidation or effusion. Stable moderate cardiomegaly. Remainder of the exam is unchanged. IMPRESSION: No active cardiopulmonary disease. Stable cardiomegaly. Electronically Signed   By: Marin Olp M.D.   On: 11/04/2017 21:02    ROS  Patient is agitated and yelling and not cooperating with history or review of systems therefore unable to be obtained  Blood pressure (!) 154/113, pulse 74, temperature 98.2 F (36.8 C), temperature source Oral, resp. rate (!) 22, height 5'  5" (1.651 m), weight 129 kg, SpO2 100 %. Physical Exam  Awake, alert, agitated, no respiratory distress appreciated HEENT: Trachea midline, no thyromegaly appreciated, no bruits auscultated Cardiovascular: Regular rate and rhythm Pulmonary: Clear to auscultation Abdominal: Positive bowel sounds, soft exam Extremities: No clubbing, cyanosis or edema noted Neurologic no focal deficits appreciated, moves all extremities Psychiatric. Patient is very agitated yelling and screaming  Assessment/Plan:  Apparently yesterday patient was somnolent requiring BiPAP. Based on arterial blood gas appears to have chronic hypercapnic respiratory  failure.atient was weaned off of BiPAP, is on albuterol, Atrovent, inhaled corticosteroid. Presently on room air with stable oxygen saturations awake and alert  Urinary tract infection. Being empirically treated on Rocephin  bMP reveals metabolic alkalosis most likely compensatory secondary to chronic respiratory acidosis. Potassium is elevated at 5.5 will need to be repeated  Leukocytosis at 15.2  History of diabetes. Blood sugars 131  Agitation. Pending psychiatry input. Patient does have an underlying history of schizophrenia            Andraya Frigon 11/05/2017, 7:58 AM

## 2017-11-05 NOTE — Progress Notes (Signed)
Patient ID: [name] [name], female   DOB: [birth year], 72 y.o.   MRN: [id number] Pulmonary/critical care attending  [name] arrived we had a long conversation of what happened. He is a very nice man denied any sort of arguments, he states that when she gets urinary tract infection she developed acute delirium and psychoses. She is yelling and screaming at this point acutely delirious. Will start on Precedex pending psychiatric input  Hermelinda Dellen, D.O.

## 2017-11-05 NOTE — Discharge Instructions (Signed)
Continue the regular medications at home.  Follow-up with your regular doctor next week.  Return to the ER for new, worsening, persistent severe shortness of breath, weakness, confusion, or any other new or worsening symptoms that concern you.
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
## 2017-11-05 NOTE — Progress Notes (Signed)
Patients tidal volumes were not improving on 18/10 Bipap settings. Patients CO2 levels also increased from 78 to 85. Per discussion with MD, patients I/E settings and rate were adjusted to 14/6 and rate of 16. Tidal volume is currently in mid 300s. Patient SAT is 96% on 30%. Will continue to monitor.

## 2017-11-05 NOTE — Progress Notes (Signed)
Pt has been calm and cooperative during assessment. Pt is confused and tearful. Mentions aurgement between her and her son prior to admission. Pt States " My son wont let me stay with him, but I really want to go home."

## 2017-11-06 LAB — BASIC METABOLIC PANEL
ANION GAP: 11 (ref 5–15)
BUN: 15 mg/dL (ref 8–23)
CALCIUM: 9.1 mg/dL (ref 8.9–10.3)
CO2: 35 mmol/L — ABNORMAL HIGH (ref 22–32)
Chloride: 96 mmol/L — ABNORMAL LOW (ref 98–111)
Creatinine, Ser: 0.88 mg/dL (ref 0.44–1.00)
GLUCOSE: 129 mg/dL — AB (ref 70–99)
Potassium: 3.5 mmol/L (ref 3.5–5.1)
SODIUM: 142 mmol/L (ref 135–145)

## 2017-11-06 LAB — PROCALCITONIN: Procalcitonin: <0.1 ng/mL

## 2017-11-06 LAB — GLUCOSE, CAPILLARY
GLUCOSE-CAPILLARY: 125 mg/dL — AB (ref 70–99)
GLUCOSE-CAPILLARY: 135 mg/dL — AB (ref 70–99)
GLUCOSE-CAPILLARY: 127 mg/dL — AB (ref 70–99)
Glucose-Capillary: 176 mg/dL — ABNORMAL HIGH (ref 70–99)
Glucose-Capillary: 148 mg/dL — ABNORMAL HIGH (ref 70–99)

## 2017-11-06 MED ORDER — HYDRALAZINE HCL 20 MG/ML IJ SOLN
10.0000 mg | INTRAMUSCULAR | Status: DC | PRN
  Administered 2017-11-06 – 2017-11-07 (×2): 10 mg via INTRAVENOUS
  Filled 2017-11-06 (×2): qty 1

## 2017-11-06 NOTE — Progress Notes (Signed)
Paitent resting in bed at this time on  BiPap.  No c/o discomfort or pain.  All medications taken and tolerated.  Patient will be transferred to Room 150.  Report given to Neoma Laming, Therapist, sports.

## 2017-11-06 NOTE — Progress Notes (Signed)
Chaplain responded to a request for prayer while rounding. Chaplain prayed for Pt and let her know we will be praying for her throughout the night.    11/06/17 1700  Clinical Encounter Type  Visited With Patient  Visit Type Follow-up  Spiritual Encounters  Spiritual Needs Prayer

## 2017-11-06 NOTE — NC FL2 (Signed)
[place] LEVEL OF CARE SCREENING TOOL     IDENTIFICATION  Patient Name: [name] [name] Birthdate: 01/04/1946 Sex: female Admission Date (Current Location): 11/04/2017  [place] and [place] Number:  [name] [id number] [place] and Address:  [facility], [address], [place], [place] [address]      Provider Number: [id number]  Attending Physician Name and Address:  Gorden Harms, MD  Relative Name and Phone Number:  [name] (Son/Legal Guardian) [phone]    Current Level of Care: Hospital Recommended Level of Care: [place] Prior Approval Number:    Date Approved/Denied:   PASRR Number: [id number]  Discharge Plan: SNF    Current Diagnoses: Patient Active Problem List   Diagnosis Date Noted  . Acute and chronic respiratory failure with hypercapnia ([place]) 11/05/2017  . Acute renal failure (ARF) ([place]) 08/05/2017  . ARF (acute renal failure) ([place]) 08/03/2017  . UTI (urinary tract infection) 01/16/2016  . Altered mental status 01/16/2016  . Metabolic encephalopathy   . Acute on chronic respiratory failure with hypoxia and hypercapnia ([place]) 10/13/2015  . Involuntary commitment 09/02/2015  . Schizoaffective disorder, bipolar type ([place])   . Urinary incontinence 10/30/2014  . GERD (gastroesophageal reflux disease) 10/30/2014  . OSA (obstructive sleep apnea) 10/30/2014  . Osteoarthrosis, unspecified whether generalized or localized, involving lower leg 10/30/2014  . COPD (chronic obstructive pulmonary disease) ([place]) 10/30/2014  . Chronic diastolic CHF (congestive heart failure) ([place]) 05/15/2014  . Morbid obesity ([place])   . History of colon polyps 03/09/2013  . Renal mass 06/26/2011  . Lytic bone lesion of hip 06/25/2011  . Hypertension 06/24/2011  . Diabetes mellitus ([place]) 06/24/2011    Orientation RESPIRATION BLADDER Height & Weight     Self, Time, Place  Normal Incontinent Weight: 273 lb 2.4 oz  (123.9 kg) Height:  5\' 6"  (167.6 cm)  BEHAVIORAL SYMPTOMS/MOOD NEUROLOGICAL BOWEL NUTRITION STATUS      Incontinent Diet(Heart Healthy/Carb Modified)  AMBULATORY STATUS COMMUNICATION OF NEEDS Skin   Extensive Assist Verbally Normal                       Personal Care Assistance Level of Assistance  Bathing, Feeding, Dressing Bathing Assistance: Maximum assistance Feeding assistance: Independent Dressing Assistance: Maximum assistance     Functional Limitations Info  Sight, Hearing, Speech Sight Info: Adequate Hearing Info: Adequate Speech Info: Adequate    SPECIAL CARE FACTORS FREQUENCY                       Contractures Contractures Info: Not present    Additional Factors Info  Code Status, Allergies Code Status Info: Full Allergies Info: Haldol Haloperidol Decanoate, Metformin, Prednisone, Raspberry           Current Medications (11/06/2017):  This is the current hospital active medication list Current Facility-Administered Medications  Medication Dose Route Frequency Provider Last Rate Last Dose  . acetaminophen (TYLENOL) tablet 650 mg  650 mg Oral Q6H PRN Tukov-Yual, Magdalene S, NP       Or  . acetaminophen (TYLENOL) suppository 650 mg  650 mg Rectal Q6H PRN Tukov-Yual, Magdalene S, NP      . aspirin EC tablet 81 mg  81 mg Oral Daily Tukov-Yual, Magdalene S, NP   81 mg at 11/06/17 0816  . atorvastatin (LIPITOR) tablet 20 mg  20 mg Oral QHS Tukov-Yual, Magdalene S, NP   20 mg at 11/05/17 2239  . benztropine (COGENTIN) tablet 0.5 mg  0.5 mg Oral BID Tukov-Yual, Magdalene S, NP   0.5 mg at 11/06/17 0814  . bisacodyl (DULCOLAX) EC tablet 5 mg  5 mg Oral Daily PRN Tukov-Yual, Magdalene S, NP      . carvedilol (COREG) tablet 6.25 mg  6.25 mg Oral BID Tukov-Yual, Magdalene S, NP   6.25 mg at 11/06/17 0815  . cefTRIAXone (ROCEPHIN) 1 g in sodium chloride 0.9 % 100 mL IVPB  1 g Intravenous Q24H Tukov-Yual, Magdalene S, NP 200 mL/hr at 11/06/17 0618 1 g at  11/06/17 0618  . divalproex (DEPAKOTE) DR tablet 500 mg  500 mg Oral TID Tukov-Yual, Magdalene S, NP   500 mg at 11/06/17 0814  . enoxaparin (LOVENOX) injection 40 mg  40 mg Subcutaneous Q12H Tukov-Yual, Magdalene S, NP   40 mg at 11/06/17 0614  . famotidine (PEPCID) tablet 20 mg  20 mg Oral BID Tukov-Yual, Magdalene S, NP   20 mg at 11/06/17 0816  . fluPHENAZine (PROLIXIN) tablet 5 mg  5 mg Oral TID Tukov-Yual, Magdalene S, NP   5 mg at 11/06/17 0815  . furosemide (LASIX) tablet 20 mg  20 mg Oral Daily Tukov-Yual, Magdalene S, NP   20 mg at 11/06/17 0815  . insulin aspart (novoLOG) injection 0-15 Units  0-15 Units Subcutaneous TID WC Tukov-Yual, Magdalene S, NP   3 Units at 11/06/17 1229  . insulin aspart (novoLOG) injection 0-5 Units  0-5 Units Subcutaneous QHS Tukov-Yual, Magdalene S, NP      . insulin aspart (novoLOG) injection 4 Units  4 Units Subcutaneous TID AC Tukov-Yual, Magdalene S, NP   4 Units at 11/06/17 1228  . insulin glargine (LANTUS) injection 11 Units  11 Units Subcutaneous QHS Tukov-Yual, Magdalene S, NP   11 Units at 11/05/17 2242  . ipratropium-albuterol (DUONEB) 0.5-2.5 (3) MG/3ML nebulizer solution 3 mL  3 mL Nebulization Q4H PRN Tukov-Yual, Magdalene S, NP      . ipratropium-albuterol (DUONEB) 0.5-2.5 (3) MG/3ML nebulizer solution 3 mL  3 mL Nebulization Q6H Tukov-Yual, Magdalene S, NP   3 mL at 11/06/17 1302  . levothyroxine (SYNTHROID, LEVOTHROID) tablet 75 mcg  75 mcg Oral QAC breakfast Tukov-Yual, Magdalene S, NP   75 mcg at 11/06/17 0815  . mometasone-formoterol (DULERA) 200-5 MCG/ACT inhaler 2 puff  2 puff Inhalation BID Tukov-Yual, Magdalene S, NP   2 puff at 11/06/17 0814  . multivitamin with minerals tablet 1 tablet  1 tablet Oral Daily Tukov-Yual, Magdalene S, NP   1 tablet at 11/06/17 0814  . nitroGLYCERIN (NITROSTAT) SL tablet 0.4 mg  0.4 mg Sublingual Q5 min PRN Tukov-Yual, Magdalene S, NP      . OLANZapine (ZYPREXA) injection 5 mg  5 mg Intramuscular Daily PRN  Tukov-Yual, Magdalene S, NP      . OLANZapine zydis (ZYPREXA) disintegrating tablet 5 mg  5 mg Oral Daily PRN Tukov-Yual, Magdalene S, NP      . ondansetron (ZOFRAN) tablet 4 mg  4 mg Oral Q6H PRN Tukov-Yual, Magdalene S, NP       Or  . ondansetron (ZOFRAN) injection 4 mg  4 mg Intravenous Q6H PRN Tukov-Yual, Magdalene S, NP      . senna-docusate (Senokot-S) tablet 1 tablet  1 tablet Oral QHS PRN Tukov-Yual, Arlyss Gandy, NP         Discharge Medications: Please see discharge summary for a list of discharge medications.  Relevant Imaging Results:  Relevant Lab Results:   Additional Information SS# [id number]  Zettie Pho, LCSW

## 2017-11-06 NOTE — Evaluation (Signed)
Physical Therapy Evaluation Patient Details Name: [name] [name] MRN: [id number] DOB: 1945/10/16 Today's Date: 11/06/2017   History of Present Illness  72 y.o. female presenting to [facility] with SOB. PMH includes DM, HTN, HLD, tremors, schizophrenia, anxiety, arthritis, bronchitis, CAD, CHF, chronic cough, COPD, GERD, and left ventricular hypertrophy.  Clinical Impression  Patient eager to attempt walking, reports she is feeling wonderful. Patient requires min assist with bed mobility, able to scoot up toward hob with trapeze. Performed sit to stand transfer with B UE support and min assist. Ambulated 3 feet up to head of bed with B UE support increased difficulty with left LE, reports it is weaker. Patient will benefit from skilled PT to address her weakness, difficulty with transfers and to increase independence with mobility.        Follow Up Recommendations Home health PT;Supervision/Assistance - 24 hour    Equipment Recommendations       Recommendations for Other Services       Precautions / Restrictions Precautions Precautions: Fall Restrictions Weight Bearing Restrictions: No      Mobility  Bed Mobility Overal bed mobility: Needs Assistance Bed Mobility: Supine to Sit;Sit to Supine   Sidelying to sit: Min assist Supine to sit: Min assist Sit to supine: Min assist   General bed mobility comments: patient requires assist with legs and trunk when moving from supine>< sit.   Transfers Overall transfer level: Needs assistance Equipment used: Rolling walker (2 wheeled) Transfers: Sit to/from Stand Sit to Stand: Min assist         General transfer comment: requires B UE assist to perform sit to stand transfer  Ambulation/Gait Ambulation/Gait assistance: Min assist Gait Distance (Feet): 3 Feet Assistive device: Rolling walker (2 wheeled)       General Gait Details: patient able to take 5 steps up toward head of the bed.  Stairs            Wheelchair  Mobility    Modified Rankin (Stroke Patients Only)       Balance Overall balance assessment: Needs assistance Sitting-balance support: Bilateral upper extremity supported Sitting balance-Leahy Scale: Fair     Standing balance support: Bilateral upper extremity supported Standing balance-Leahy Scale: Fair                               Pertinent Vitals/Pain Pain Assessment: 0-10 Pain Score: 3  Pain Descriptors / Indicators: Burning    Home Living Family/patient expects to be discharged to:: Skilled nursing facility Living Arrangements: Children Available Help at Discharge: Family Type of Home: House       Home Layout: One level Home Equipment: Environmental consultant - 2 wheels      Prior Function Level of Independence: Needs assistance      ADL's / Homemaking Assistance Needed: Patient currently living with son, after leaving the [facility] about a month ago.         Hand Dominance        Extremity/Trunk Assessment   Upper Extremity Assessment Upper Extremity Assessment: Overall WFL for tasks assessed    Lower Extremity Assessment Lower Extremity Assessment: Overall WFL for tasks assessed       Communication   Communication: No difficulties  Cognition Arousal/Alertness: Awake/alert Behavior During Therapy: WFL for tasks assessed/performed Overall Cognitive Status: Within Functional Limits for tasks assessed  General Comments      Exercises Other Exercises Other Exercises: standing marching with B UE support needed, supine ap, heel slides, slr with assist on left, hip abd/add x 10 reps each.    Assessment/Plan    PT Assessment Patient needs continued PT services  PT Problem List Decreased strength;Decreased mobility;Decreased activity tolerance;Decreased balance;Obesity       PT Treatment Interventions Gait training;Therapeutic exercise;Therapeutic activities;Patient/family  education;Balance training;Functional mobility training    PT Goals (Current goals can be found in the Care Plan section)  Acute Rehab PT Goals Patient Stated Goal: to get walking PT Goal Formulation: With patient Time For Goal Achievement: 11/20/17 Potential to Achieve Goals: Fair    Frequency Min 2X/week   Barriers to discharge        Co-evaluation               AM-PAC PT "6 Clicks" Daily Activity  Outcome Measure Difficulty turning over in bed (including adjusting bedclothes, sheets and blankets)?: Unable Difficulty moving from lying on back to sitting on the side of the bed? : Unable Difficulty sitting down on and standing up from a chair with arms (e.g., wheelchair, bedside commode, etc,.)?: Unable Help needed moving to and from a bed to chair (including a wheelchair)?: A Little Help needed walking in hospital room?: A Lot Help needed climbing 3-5 steps with a railing? : Total 6 Click Score: 9    End of Session   Activity Tolerance: Patient tolerated treatment well;No increased pain Patient left: with bed alarm set;in bed Nurse Communication: Mobility status PT Visit Diagnosis: Unsteadiness on feet (R26.81);Other abnormalities of gait and mobility (R26.89);History of falling (Z91.81);Muscle weakness (generalized) (M62.81)    Time: 1550-1610 PT Time Calculation (min) (ACUTE ONLY): 20 min   Charges:   PT Evaluation $PT Eval Moderate Complexity: 1 Mod PT Treatments $Therapeutic Exercise: 8-22 mins        Milyn Stapleton, PT, GCS 11/06/17,4:30 PM

## 2017-11-06 NOTE — Progress Notes (Signed)
Chaplain was rounding on 1A when a page came from Financial controller for Pt support. Chaplain entered room and Pt recognized Chaplain. Chaplain greeted her with humor. Pt asked for prayer for forgiveness. She thought God was angry at her for not doing good. Chaplain inquired about her though that God will punished her because she don't do everything right.  That was her thought. Chaplain asked reflective questions and offered blessing by asking her to hold a rock that [place] had from [address]" and anointing her with oil. Pt asked if Chaplain will return. Chaplain agreed to come back for short visits to see how she was doing. Chaplain informed nurse and SW of knowledge of patient from previous visits.    11/06/17 0900  Clinical Encounter Type  Visited With Patient  Visit Type Spiritual support  Referral From Nurse  Spiritual Encounters  Spiritual Needs Prayer;Emotional

## 2017-11-06 NOTE — Progress Notes (Signed)
LCSW met with patient and provided emotional support. The patient needed to vent and stated she would love to live on her own, LCSW gently reminded her that she needed to focus on her health and rest right now and discussed other ways she could talk to her son about clothes and having a better place to stay. She agreed to focus on her health and thanked me for the conversation.  [employer] LCSW [phone]
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
## 2017-11-06 NOTE — Plan of Care (Signed)
  Problem: Education: Goal: Knowledge of General Education information will improve Description: Including pain rating scale, medication(s)/side effects and non-pharmacologic comfort measures Outcome: Progressing   Problem: Health Behavior/Discharge Planning: Goal: Ability to manage health-related needs will improve Outcome: Progressing   Problem: Clinical Measurements: Goal: Ability to maintain clinical measurements within normal limits will improve Outcome: Progressing Goal: Will remain free from infection Outcome: Progressing Goal: Diagnostic test results will improve Outcome: Progressing Goal: Respiratory complications will improve Outcome: Progressing Goal: Cardiovascular complication will be avoided Outcome: Progressing   Problem: Activity: Goal: Risk for activity intolerance will decrease Outcome: Progressing   Problem: Coping: Goal: Level of anxiety will decrease Outcome: Progressing   Problem: Safety: Goal: Ability to remain free from injury will improve Outcome: Progressing   Problem: Skin Integrity: Goal: Risk for impaired skin integrity will decrease Outcome: Progressing   

## 2017-11-06 NOTE — Progress Notes (Signed)
[place] at [place] NAME: Delinda Malan    MR#:  [id number]  DATE OF BIRTH:  [birth year]  SUBJECTIVE:  CHIEF COMPLAINT:   Chief Complaint  Patient presents with  . Shortness of Breath  Patient without complaint, psychiatry input appreciated, case discussed with case management, for physical therapy though patient states that she has not been ambulatory in numerous months, follow-up on urine cultures  REVIEW OF SYSTEMS:  CONSTITUTIONAL: No fever, fatigue or weakness.  EYES: No blurred or double vision.  EARS, NOSE, AND THROAT: No tinnitus or ear pain.  RESPIRATORY: No cough, shortness of breath, wheezing or hemoptysis.  CARDIOVASCULAR: No chest pain, orthopnea, edema.  GASTROINTESTINAL: No nausea, vomiting, diarrhea or abdominal pain.  GENITOURINARY: No dysuria, hematuria.  ENDOCRINE: No polyuria, nocturia,  HEMATOLOGY: No anemia, easy bruising or bleeding SKIN: No rash or lesion. MUSCULOSKELETAL: No joint pain or arthritis.   NEUROLOGIC: No tingling, numbness, weakness.  PSYCHIATRY: No anxiety or depression.   ROS  DRUG ALLERGIES:   Allergies  Allergen Reactions  . Haldol [Haloperidol Decanoate] Swelling and Other (See Comments)    Reaction:  Swelling of tongue and blurred vision    . Metformin Diarrhea  . Prednisone Other (See Comments)    Unknown reaction  . Raspberry Swelling and Other (See Comments)    Reaction:  Swelling of lips     VITALS:  Blood pressure (!) 163/87, pulse 68, temperature 98.8 F (37.1 C), temperature source Oral, resp. rate 18, height 5\' 6"  (1.676 m), weight 123.9 kg, SpO2 93 %.  PHYSICAL EXAMINATION:  GENERAL:  72 y.o.-year-old patient lying in the bed with no acute distress.  EYES: Pupils equal, round, reactive to light and accommodation. No scleral icterus. Extraocular muscles intact.  HEENT: Head atraumatic, normocephalic. Oropharynx and nasopharynx clear.  NECK:  Supple, no jugular venous  distention. No thyroid enlargement, no tenderness.  LUNGS: Normal breath sounds bilaterally, no wheezing, rales,rhonchi or crepitation. No use of accessory muscles of respiration.  CARDIOVASCULAR: S1, S2 normal. No murmurs, rubs, or gallops.  ABDOMEN: Soft, nontender, nondistended. Bowel sounds present. No organomegaly or mass.  EXTREMITIES: No pedal edema, cyanosis, or clubbing.  NEUROLOGIC: Cranial nerves II through XII are intact. Muscle strength 5/5 in all extremities. Sensation intact. Gait not checked.  PSYCHIATRIC: The patient is alert and oriented x 3.  SKIN: No obvious rash, lesion, or ulcer.   Physical Exam LABORATORY PANEL:   CBC Recent Labs  Lab 11/04/17 2053  WBC 15.2*  HGB 14.3  HCT 42.7  PLT 184   ------------------------------------------------------------------------------------------------------------------  Chemistries  Recent Labs  Lab 11/06/17 0358  NA 142  K 3.5  CL 96*  CO2 35*  GLUCOSE 129*  BUN 15  CREATININE 0.88  CALCIUM 9.1   ------------------------------------------------------------------------------------------------------------------  Cardiac Enzymes Recent Labs  Lab 11/04/17 2053  TROPONINI <0.03   ------------------------------------------------------------------------------------------------------------------  RADIOLOGY:  Dg Chest 2 View  Result Date: 11/04/2017 CLINICAL DATA:  Shortness of breath. EXAM: CHEST - 2 VIEW COMPARISON:  08/14/2017 FINDINGS: Lungs are adequately inflated without focal airspace consolidation or effusion. Stable moderate cardiomegaly. Remainder of the exam is unchanged. IMPRESSION: No active cardiopulmonary disease. Stable cardiomegaly. Electronically Signed   By: Marin Olp M.D.   On: 11/04/2017 21:02    ASSESSMENT AND PLAN:  *Acute delirium  Appears resolved  Noted history of mental illness Psychiatry input appreciated, neurochecks per routine   *Acute UTI  Continue empiric Rocephin and  follow-up on cultures   *Acute  psychosis Continue Depakote and fluphenazine  *Acute hyperkalemia Resolved  *Chronic diabetes mellitus type 2 hold Stable on current regiment  *Chronic extreme morbid obesity Lifestyle modification recommended  *Chronic immobility syndrome Stable Physical therapy to evaluate/treat   Disposition to home in the care of family versus skilled nursing home placement   All the records are reviewed and case discussed with Care Management/Social Workerr. Management plans discussed with the patient, family and they are in agreement.  CODE STATUS: full  TOTAL TIME TAKING CARE OF THIS PATIENT: 40 minutes.     POSSIBLE D/C IN 1-2 DAYS, DEPENDING ON CLINICAL CONDITION.   [name] Maribell Demeo M.D on 11/06/2017   Between 7am to 6pm - Pager - [phone]  After 6pm go to [email or web address] - password [email or web address] [facility]  Office  [phone]  CC: Primary care physician; Patient, No Pcp Per  Note: This dictation was prepared with Dragon dictation along with smaller phrase technology. Any transcriptional errors that result from this process are unintentional.

## 2017-11-07 DIAGNOSIS — F250 Schizoaffective disorder, bipolar type: Secondary | ICD-10-CM

## 2017-11-07 LAB — CBC
HEMATOCRIT: 41.8 % (ref 35.0–47.0)
HEMOGLOBIN: 13.7 g/dL (ref 12.0–16.0)
MCH: 30 pg (ref 26.0–34.0)
MCHC: 32.9 g/dL (ref 32.0–36.0)
MCV: 91.3 fL (ref 80.0–100.0)
PLATELETS: 195 10*3/uL (ref 150–440)
RBC: 4.57 MIL/uL (ref 3.80–5.20)
RDW: 15.2 % — ABNORMAL HIGH (ref 11.5–14.5)
WBC: 7.5 10*3/uL (ref 3.6–11.0)

## 2017-11-07 LAB — PROCALCITONIN

## 2017-11-07 LAB — URINE CULTURE: Culture: >=100000 — AB

## 2017-11-07 LAB — GLUCOSE, CAPILLARY
GLUCOSE-CAPILLARY: 80 mg/dL (ref 70–99)
Glucose-Capillary: 107 mg/dL — ABNORMAL HIGH (ref 70–99)
Glucose-Capillary: 160 mg/dL — ABNORMAL HIGH (ref 70–99)
Glucose-Capillary: 131 mg/dL — ABNORMAL HIGH (ref 70–99)

## 2017-11-07 MED ORDER — AMPICILLIN 500 MG PO CAPS
500.0000 mg | ORAL_CAPSULE | Freq: Three times a day (TID) | ORAL | Status: DC
  Administered 2017-11-07 – 2017-11-08 (×4): 500 mg via ORAL
  Filled 2017-11-07 (×5): qty 1

## 2017-11-07 MED ORDER — IPRATROPIUM-ALBUTEROL 0.5-2.5 (3) MG/3ML IN SOLN
3.0000 mL | Freq: Three times a day (TID) | RESPIRATORY_TRACT | Status: DC
  Administered 2017-11-07: 3 mL via RESPIRATORY_TRACT
  Filled 2017-11-07 (×2): qty 3

## 2017-11-07 MED ORDER — CEFDINIR 300 MG PO CAPS
300.0000 mg | ORAL_CAPSULE | Freq: Two times a day (BID) | ORAL | Status: DC
  Administered 2017-11-07 – 2017-11-08 (×3): 300 mg via ORAL
  Filled 2017-11-07 (×4): qty 1

## 2017-11-07 MED ORDER — SODIUM CHLORIDE 0.9 % IV SOLN
3.0000 g | Freq: Four times a day (QID) | INTRAVENOUS | Status: DC
  Administered 2017-11-07: 3 g via INTRAVENOUS
  Filled 2017-11-07 (×4): qty 3

## 2017-11-07 NOTE — Clinical Social Work Placement (Signed)
   CLINICAL SOCIAL WORK PLACEMENT  NOTE  Date:  11/07/2017  Patient Details  Name: [name] [name] MRN: [id number] Date of Birth: [birth year]  Clinical Social Work is seeking post-discharge placement for this patient at the [place] level of care (*CSW will initial, date and re-position this form in  chart as items are completed):  Yes   Patient/family provided with [facility] Work Department's list of facilities offering this level of care within the geographic area requested by the patient (or if unable, by the patient's family).  Yes   Patient/family informed of their freedom to choose among providers that offer the needed level of care, that participate in Medicare, Medicaid or managed care program needed by the patient, have an available bed and are willing to accept the patient.  Yes   Patient/family informed of [facility]'s ownership interest in [facility] and [facility], as well as of the fact that they are under no obligation to receive care at these facilities.  PASRR submitted to EDS on       PASRR number received on       Existing PASRR number confirmed on 11/06/17     FL2 transmitted to all facilities in geographic area requested by pt/family on 11/06/17     FL2 transmitted to all facilities within larger geographic area on 11/06/17     Patient informed that his/her managed care company has contracts with or will negotiate with certain facilities, including the following:        Yes   Patient/family informed of bed offers received.  Patient chooses bed at [facility])     Physician recommends and patient chooses bed at      Patient to be transferred to   on  .  Patient to be transferred to facility by       Patient family notified on   of transfer.  Name of family member notified:        PHYSICIAN       Additional Comment:    _______________________________________________ Adaley Kiene, Veronia Beets, LCSW 11/07/2017,  12:17 PM

## 2017-11-07 NOTE — Progress Notes (Addendum)
[facility] SN[facility] authorization has been received, authorization # [id number]. Clinical Education officer, museum (CSW) made [facility] admissions coordinator aware of above. CSW also made admissions coordinator aware that patient needs a Bi-pap at night and per respiratory therapy settings are 14/6, 30% which is 2.5 liters of oxygen. CSW faxed Bi-pap order to [facility] as requested. [name] [name] is aware of above. CSW will continue to follow and assist as needed.   [employer], LCSW [phone]

## 2017-11-07 NOTE — Progress Notes (Signed)
Pt running sinus arrhythmia with pAc. Dr wieting made aware. No new orders obtained.

## 2017-11-07 NOTE — Progress Notes (Signed)
[facility] at [place] NAME: [name] [name]    MR#:  [id number]  DATE OF BIRTH:  Nov 12, 1945  SUBJECTIVE:  CHIEF COMPLAINT:   Chief Complaint  Patient presents with  . Shortness of Breath  Patient without complaint, case discussed with case management-family unable to provide care, trying to work on disposition with probable need for skilled nursing home placement, urine cultures noted  REVIEW OF SYSTEMS:  CONSTITUTIONAL: No fever, fatigue or weakness.  EYES: No blurred or double vision.  EARS, NOSE, AND THROAT: No tinnitus or ear pain.  RESPIRATORY: No cough, shortness of breath, wheezing or hemoptysis.  CARDIOVASCULAR: No chest pain, orthopnea, edema.  GASTROINTESTINAL: No nausea, vomiting, diarrhea or abdominal pain.  GENITOURINARY: No dysuria, hematuria.  ENDOCRINE: No polyuria, nocturia,  HEMATOLOGY: No anemia, easy bruising or bleeding SKIN: No rash or lesion. MUSCULOSKELETAL: No joint pain or arthritis.   NEUROLOGIC: No tingling, numbness, weakness.  PSYCHIATRY: No anxiety or depression.   ROS  DRUG ALLERGIES:   Allergies  Allergen Reactions  . Haldol [Haloperidol Decanoate] Swelling and Other (See Comments)    Reaction:  Swelling of tongue and blurred vision    . Metformin Diarrhea  . Prednisone Other (See Comments)    Unknown reaction  . Raspberry Swelling and Other (See Comments)    Reaction:  Swelling of lips     VITALS:  Blood pressure (!) 158/100, pulse 74, temperature 98 F (36.7 C), temperature source Axillary, resp. rate 16, height 5\' 6"  (1.676 m), weight 123.9 kg, SpO2 97 %.  PHYSICAL EXAMINATION:  GENERAL:  72 y.o.-year-old patient lying in the bed with no acute distress.  EYES: Pupils equal, round, reactive to light and accommodation. No scleral icterus. Extraocular muscles intact.  HEENT: Head atraumatic, normocephalic. Oropharynx and nasopharynx clear.  NECK:  Supple, no jugular venous distention. No thyroid  enlargement, no tenderness.  LUNGS: Normal breath sounds bilaterally, no wheezing, rales,rhonchi or crepitation. No use of accessory muscles of respiration.  CARDIOVASCULAR: S1, S2 normal. No murmurs, rubs, or gallops.  ABDOMEN: Soft, nontender, nondistended. Bowel sounds present. No organomegaly or mass.  EXTREMITIES: No pedal edema, cyanosis, or clubbing.  NEUROLOGIC: Cranial nerves II through XII are intact. Muscle strength 5/5 in all extremities. Sensation intact. Gait not checked.  PSYCHIATRIC: The patient is alert and oriented x 3.  SKIN: No obvious rash, lesion, or ulcer.   Physical Exam LABORATORY PANEL:   CBC Recent Labs  Lab 11/04/17 2053  WBC 15.2*  HGB 14.3  HCT 42.7  PLT 184   ------------------------------------------------------------------------------------------------------------------  Chemistries  Recent Labs  Lab 11/06/17 0358  NA 142  K 3.5  CL 96*  CO2 35*  GLUCOSE 129*  BUN 15  CREATININE 0.88  CALCIUM 9.1   ------------------------------------------------------------------------------------------------------------------  Cardiac Enzymes Recent Labs  Lab 11/04/17 2053  TROPONINI <0.03   ------------------------------------------------------------------------------------------------------------------  RADIOLOGY:  No results found.  ASSESSMENT AND PLAN:  *Acute delirium  Resolved Noted history of mental illness Psychiatry input appreciated  *Acute enterococcus faecalis/E. coli UTI  Ampicillin and Omnicef for 5-day course  *Acute psychosis Stable Continue Depakote and fluphenazine  *Acute hyperkalemia Resolved  *Chronic diabetes mellitus type 2 hold Stable on current regiment  *Chronic extreme morbid obesity Lifestyle modification recommended  *Chronic immobility syndrome Stable Physical therapy to evaluate/treat  Disposition to skilled nursing facility when bed is available   All the records are reviewed and case  discussed with Care Management/Social Workerr. Management plans discussed with the patient, family  and they are in agreement.  CODE STATUS: full  TOTAL TIME TAKING CARE OF THIS PATIENT: 40 minutes.     POSSIBLE D/C IN 1-2 DAYS, DEPENDING ON CLINICAL CONDITION.   [name] [name] [name] [name] M.D on 11/07/2017   Between 7am to 6pm - Pager - [phone]  After 6pm go to [email or web address] - password [email or web address] [facility]  Office  [phone]  CC: Primary care physician; Patient, No Pcp Per  Note: This dictation was prepared with Dragon dictation along with smaller phrase technology. Any transcriptional errors that result from this process are unintentional.

## 2017-11-07 NOTE — Consult Note (Signed)
[facility] Consult   Reason for Consult: Follow-up consult for this 72 year old woman known very well-known to myself and the psychiatric service with chronic mental health problems as well as multiple severe medical issues Referring Physician: Salary Patient Identification: [name] [name] MRN:  [id number] Principal Diagnosis: Schizoaffective bipolar Diagnosis:   Patient Active Problem List   Diagnosis Date Noted  . Acute and chronic respiratory failure with hypercapnia ([place]) [J96.22] 11/05/2017  . Acute renal failure (ARF) ([place]) [N17.9] 08/05/2017  . ARF (acute renal failure) ([place]) [N17.9] 08/03/2017  . UTI (urinary tract infection) [N39.0] 01/16/2016  . Altered mental status [R41.82] 01/16/2016  . Metabolic encephalopathy [Z99.35]   . Acute on chronic respiratory failure with hypoxia and hypercapnia (HCC) [T01.77, J96.22] 10/13/2015  . Involuntary commitment [Z04.6] 09/02/2015  . Schizoaffective disorder, bipolar type ([place]) [F25.0]   . Urinary incontinence [R32] 10/30/2014  . GERD (gastroesophageal reflux disease) [K21.9] 10/30/2014  . OSA (obstructive sleep apnea) [G47.33] 10/30/2014  . Osteoarthrosis, unspecified whether generalized or localized, involving lower leg [M17.10] 10/30/2014  . COPD (chronic obstructive pulmonary disease) ([place]) [J44.9] 10/30/2014  . Chronic diastolic CHF (congestive heart failure) ([place]) [I50.32] 05/15/2014  . Morbid obesity ([place]) [E66.01]   . History of colon polyps [Z86.010] 03/09/2013  . Renal mass [N28.89] 06/26/2011  . 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 Hypertension [I10] 06/24/2011  . Diabetes mellitus ([place]) [E11.9] 06/24/2011    Total Time spent with patient: 30 minutes  Subjective:   [name] [name] is a 72 y.o. female patient admitted with "there is nothing wrong with my mind".  HPI: 72 year old woman very well-known to me from many prior encounters.  She is in the hospital recovering from a urinary tract  infection which just complicates all of her other medical issues.  Patient currently is denying any psychiatric symptoms, which is par for the course for her, but also seems to actually be thinking pretty clearly and to be in a good state of mind.  She describes events leading to the hospitalization without invoking any kind of paranoia or bizarre scenario.  She denies any anger or hostility.  Does not come across as grossly paranoid.  Her affect showed a little bit of tearfulness but that is very normal for her.  At least she did not become completely overwrought.  She has no complaints about her medicine.  She was able to describe her situation and pretty clear-cut terms.  Past Psychiatric History: Long history of schizoaffective disorder multiple psychiatric hospitalizations long history of poor insight leading to poor compliance with very problematic behavior which has made it difficult for her to stay in any kind of placement for a long period all of this is greatly complicated by her serious medical issues  Risk to Self: Is patient at risk for suicide?: No Risk to Others:   Prior Inpatient Therapy:   Prior Outpatient Therapy:    Past Medical History:  Past Medical History:  Diagnosis Date  . Anxiety   . Arthritis   . Bronchitis   . Bursitis   . CAD (coronary artery disease)   . CHF (congestive heart failure) ([place])   . Chronic cough   . Chronic kidney disease    shadow on x-ray  . COPD (chronic obstructive pulmonary disease) ([place])   . Depression   . DM2 (diabetes mellitus, type 2) ([place])   . Environmental allergies   . GERD (gastroesophageal reflux disease)   . Hypercholesteremia   . Hypertension   .  Left ventricular outflow tract obstruction    a. echo 03/2014: EF 60-65%, hypernamic LV systolic fxn, mod LVH w/ LVOT gradient estimated at 68 mm Hg w/ valsalva, very small LV internal cavity size, mildly increased LV posterior wall thickness, mild Ao valve scl w/o stenosis, diastolic  dysfunction, normal RVSP  . Lower extremity edema   . LVH (left ventricular hypertrophy)    a. echo suggests long standing uncontrolled htn. she will not do well when dehydrated, LV cavity obliteration  . Muscle weakness   . Obesity   . On supplemental oxygen therapy    AS NEEDED  . OSA (obstructive sleep apnea)    does not use machine  . Osteoarthritis   . Schizophrenia ([place])   . Tremors of nervous system   . Wheezing     Past Surgical History:  Procedure Laterality Date  . CATARACT EXTRACTION W/PHACO Left 09/10/2014   Procedure: CATARACT EXTRACTION PHACO AND INTRAOCULAR LENS PLACEMENT (IOC);  Surgeon: Birder Robson, MD;  Location: [facility] ORS;  Service: Ophthalmology;  Laterality: Left;  [place]: 00:44 AP%: 22.8 CDE: 10.24 Fluid lot #[id number]  . CATARACT EXTRACTION W/PHACO Right 10/15/2014   Procedure: CATARACT EXTRACTION PHACO AND INTRAOCULAR LENS PLACEMENT (IOC);  Surgeon: Birder Robson, MD;  Location: [facility] ORS;  Service: Ophthalmology;  Laterality: Right;  [place]: 00:52 AP:40.1 CDE:11.83 LOT PACK #[id number]  . CHOLECYSTECTOMY    . COLONOSCOPY  04/2011   [facility] per patient incomplete  . EYE SURGERY    . JOINT REPLACEMENT     TKR  . KNEE RECONSTRUCTION, MEDIAL PATELLAR FEMORAL LIGAMENT    . RENAL BIOPSY    . TONSILLECTOMY    . TONSILLECTOMY     Family History:  Family History  Problem Relation Age of Onset  . Heart attack Mother   . Colon cancer Neg Hx   . Liver disease Neg Hx    Family Psychiatric  History: Unknown Social History:  Social History   Substance and Sexual Activity  Alcohol Use No   Comment: occ.     Social History   Substance and Sexual Activity  Drug Use No    Social History   Socioeconomic History  . Marital status: Widowed    Spouse name: Not on file  . Number of children: 2  . Years of education: Not on file  . Highest education level: Not on file  Occupational History  . Not on file  Social Needs  . Financial resource strain: Not on file   . Food insecurity:    Worry: Not on file    Inability: Not on file  . Transportation needs:    Medical: Not on file    Non-medical: Not on file  Tobacco Use  . Smoking status: Former Smoker    Packs/day: 3.00    Years: 40.00    Pack years: 120.00  . Smokeless tobacco: Never Used  . Tobacco comment: quit in 2009  Substance and Sexual Activity  . Alcohol use: No    Comment: occ.  . Drug use: No  . Sexual activity: Not Currently  Lifestyle  . Physical activity:    Days per week: Not on file    Minutes per session: Not on file  . Stress: Not on file  Relationships  . Social connections:    Talks on phone: Not on file    Gets together: Not on file    Attends religious service: Not on file    Active member of club or organization: Not on  file    Attends meetings of clubs or organizations: Not on file    Relationship status: Not on file  Other Topics Concern  . Not on file  Social History Narrative  . Not on file   Additional Social History:    Allergies:   Allergies  Allergen Reactions  . Haldol [Haloperidol Decanoate] Swelling and Other (See Comments)    Reaction:  Swelling of tongue and blurred vision    . Metformin Diarrhea  . Prednisone Other (See Comments)    Unknown reaction  . Raspberry Swelling and Other (See Comments)    Reaction:  Swelling of lips     Labs:  Results for orders placed or performed during the hospital encounter of 11/04/17 (from the past 48 hour(s))  Glucose, capillary     Status: None   Collection Time: 11/05/17 10:38 PM  Result Value Ref Range   Glucose-Capillary 99 70 - 99 mg/dL   Comment 1 Notify RN   Basic metabolic panel     Status: Abnormal   Collection Time: 11/06/17  3:58 AM  Result Value Ref Range   Sodium 142 135 - 145 mmol/L   Potassium 3.5 3.5 - 5.1 mmol/L   Chloride 96 (L) 98 - 111 mmol/L   CO2 35 (H) 22 - 32 mmol/L   Glucose, Bld 129 (H) 70 - 99 mg/dL   BUN 15 8 - 23 mg/dL   Creatinine, Ser 0.88 0.44 - 1.00 mg/dL    Calcium 9.1 8.9 - 10.3 mg/dL   GFR calc non Af Amer >60 >60 mL/min   GFR calc Af Amer >60 >60 mL/min    Comment: (NOTE) The eGFR has been calculated using the CKD EPI equation. This calculation has not been validated in all clinical situations. eGFR's persistently <60 mL/min signify possible Chronic Kidney Disease.    Anion gap 11 5 - 15    Comment: Performed at [facility], [address] [place], [place] [address]  Procalcitonin     Status: None   Collection Time: 11/06/17  3:58 AM  Result Value Ref Range   Procalcitonin <0.10 ng/mL    Comment:        Interpretation: PCT (Procalcitonin) <= 0.5 ng/mL: Systemic infection (sepsis) is not likely. Local bacterial infection is possible. (NOTE)       Sepsis PCT Algorithm           Lower Respiratory Tract                                      Infection PCT Algorithm    ----------------------------     ----------------------------         PCT < 0.25 ng/mL                PCT < 0.10 ng/mL         Strongly encourage             Strongly discourage   discontinuation of antibiotics    initiation of antibiotics    ----------------------------     -----------------------------       PCT 0.25 - 0.50 ng/mL            PCT 0.10 - 0.25 ng/mL               OR       >80% decrease in PCT  Discourage initiation of                                            antibiotics      Encourage discontinuation           of antibiotics    ----------------------------     -----------------------------         PCT >= 0.50 ng/mL              PCT 0.26 - 0.50 ng/mL               AND        <80% decrease in PCT             Encourage initiation of                                             antibiotics       Encourage continuation           of antibiotics    ----------------------------     -----------------------------        PCT >= 0.50 ng/mL                  PCT > 0.50 ng/mL               AND         increase in PCT                   Strongly encourage                                      initiation of antibiotics    Strongly encourage escalation           of antibiotics                                     -----------------------------                                           PCT <= 0.25 ng/mL                                                 OR                                        > 80% decrease in PCT                                     Discontinue / Do not initiate  antibiotics Performed at [facility], [address] [place], [place] [address]   Glucose, capillary     Status: Abnormal   Collection Time: 11/06/17  4:29 AM  Result Value Ref Range   Glucose-Capillary 125 (H) 70 - 99 mg/dL   Comment 1 Notify RN   Glucose, capillary     Status: Abnormal   Collection Time: 11/06/17  7:26 AM  Result Value Ref Range   Glucose-Capillary 135 (H) 70 - 99 mg/dL  Glucose, capillary     Status: Abnormal   Collection Time: 11/06/17 11:41 AM  Result Value Ref Range   Glucose-Capillary 176 (H) 70 - 99 mg/dL  Glucose, capillary     Status: Abnormal   Collection Time: 11/06/17  5:15 PM  Result Value Ref Range   Glucose-Capillary 127 (H) 70 - 99 mg/dL  Glucose, capillary     Status: Abnormal   Collection Time: 11/06/17  9:21 PM  Result Value Ref Range   Glucose-Capillary 148 (H) 70 - 99 mg/dL  Procalcitonin     Status: None   Collection Time: 11/07/17  7:19 AM  Result Value Ref Range   Procalcitonin <0.10 ng/mL    Comment:        Interpretation: PCT (Procalcitonin) <= 0.5 ng/mL: Systemic infection (sepsis) is not likely. Local bacterial infection is possible. (NOTE)       Sepsis PCT Algorithm           Lower Respiratory Tract                                      Infection PCT Algorithm    ----------------------------     ----------------------------         PCT < 0.25 ng/mL                PCT < 0.10 ng/mL         Strongly encourage             Strongly  discourage   discontinuation of antibiotics    initiation of antibiotics    ----------------------------     -----------------------------       PCT 0.25 - 0.50 ng/mL            PCT 0.10 - 0.25 ng/mL               OR       >80% decrease in PCT            Discourage initiation of                                            antibiotics      Encourage discontinuation           of antibiotics    ----------------------------     -----------------------------         PCT >= 0.50 ng/mL              PCT 0.26 - 0.50 ng/mL               AND        <80% decrease in PCT             Encourage initiation of  antibiotics       Encourage continuation           of antibiotics    ----------------------------     -----------------------------        PCT >= 0.50 ng/mL                  PCT > 0.50 ng/mL               AND         increase in PCT                  Strongly encourage                                      initiation of antibiotics    Strongly encourage escalation           of antibiotics                                     -----------------------------                                           PCT <= 0.25 ng/mL                                                 OR                                        > 80% decrease in PCT                                     Discontinue / Do not initiate                                             antibiotics Performed at [facility], [address] [place], [place] [address]   CBC     Status: Abnormal   Collection Time: 11/07/17  7:19 AM  Result Value Ref Range   WBC 7.5 3.6 - 11.0 K/uL   RBC 4.57 3.80 - 5.20 MIL/uL   Hemoglobin 13.7 12.0 - 16.0 g/dL   HCT 41.8 35.0 - 47.0 %   MCV 91.3 80.0 - 100.0 fL   MCH 30.0 26.0 - 34.0 pg   MCHC 32.9 32.0 - 36.0 g/dL   RDW 15.2 (H) 11.5 - 14.5 %   Platelets 195 150 - 440 K/uL    Comment: Performed at [facility], [address] [place],   [address]  Glucose, capillary     Status: Abnormal   Collection Time: 11/07/17  7:38 AM  Result Value Ref Range   Glucose-Capillary 107 (H) 70 - 99 mg/dL   Comment 1 Notify RN   Glucose, capillary     Status: Abnormal  Collection Time: 11/07/17 11:55 AM  Result Value Ref Range   Glucose-Capillary 160 (H) 70 - 99 mg/dL   Comment 1 Notify RN   Glucose, capillary     Status: Abnormal   Collection Time: 11/07/17  5:13 PM  Result Value Ref Range   Glucose-Capillary 131 (H) 70 - 99 mg/dL   Comment 1 Notify RN     Current Facility-Administered Medications  Medication Dose Route Frequency Provider Last Rate Last Dose  . acetaminophen (TYLENOL) tablet 650 mg  650 mg Oral Q6H PRN Tukov-Yual, Magdalene S, NP       Or  . acetaminophen (TYLENOL) suppository 650 mg  650 mg Rectal Q6H PRN Tukov-Yual, Magdalene S, NP      . ampicillin (PRINCIPEN) capsule 500 mg  500 mg Oral Q8H Salary, Montell D, MD   500 mg at 11/07/17 1408  . aspirin EC tablet 81 mg  81 mg Oral Daily Tukov-Yual, Magdalene S, NP   81 mg at 11/07/17 0902  . atorvastatin (LIPITOR) tablet 20 mg  20 mg Oral QHS Tukov-Yual, Magdalene S, NP   20 mg at 11/06/17 2129  . benztropine (COGENTIN) tablet 0.5 mg  0.5 mg Oral BID Tukov-Yual, Magdalene S, NP   0.5 mg at 11/07/17 0859  . bisacodyl (DULCOLAX) EC tablet 5 mg  5 mg Oral Daily PRN Tukov-Yual, Magdalene S, NP      . carvedilol (COREG) tablet 6.25 mg  6.25 mg Oral BID Tukov-Yual, Magdalene S, NP   6.25 mg at 11/07/17 0903  . cefdinir (OMNICEF) capsule 300 mg  300 mg Oral Q12H Salary, Montell D, MD   300 mg at 11/07/17 1241  . divalproex (DEPAKOTE) DR tablet 500 mg  500 mg Oral TID Tukov-Yual, Magdalene S, NP   500 mg at 11/07/17 1755  . enoxaparin (LOVENOX) injection 40 mg  40 mg Subcutaneous Q12H Tukov-Yual, Magdalene S, NP   40 mg at 11/07/17 1758  . famotidine (PEPCID) tablet 20 mg  20 mg Oral BID Tukov-Yual, Magdalene S, NP   20 mg at 11/07/17 0902  . fluPHENAZine (PROLIXIN) tablet 5  mg  5 mg Oral TID Tukov-Yual, Magdalene S, NP   5 mg at 11/07/17 1755  . furosemide (LASIX) tablet 20 mg  20 mg Oral Daily Tukov-Yual, Magdalene S, NP   20 mg at 11/07/17 0902  . hydrALAZINE (APRESOLINE) injection 10 mg  10 mg Intravenous Q4H PRN Salary, Holly Bodily D, MD   10 mg at 11/07/17 1806  . insulin aspart (novoLOG) injection 0-15 Units  0-15 Units Subcutaneous TID WC Tukov-Yual, Magdalene S, NP   2 Units at 11/07/17 1757  . insulin aspart (novoLOG) injection 0-5 Units  0-5 Units Subcutaneous QHS Tukov-Yual, Magdalene S, NP      . insulin aspart (novoLOG) injection 4 Units  4 Units Subcutaneous TID AC Tukov-Yual, Magdalene S, NP   4 Units at 11/07/17 1756  . insulin glargine (LANTUS) injection 11 Units  11 Units Subcutaneous QHS Tukov-Yual, Magdalene S, NP   11 Units at 11/06/17 2129  . ipratropium-albuterol (DUONEB) 0.5-2.5 (3) MG/3ML nebulizer solution 3 mL  3 mL Nebulization Q4H PRN Tukov-Yual, Magdalene S, NP   3 mL at 11/07/17 0722  . levothyroxine (SYNTHROID, LEVOTHROID) tablet 75 mcg  75 mcg Oral QAC breakfast Tukov-Yual, Magdalene S, NP   75 mcg at 11/07/17 0900  . mometasone-formoterol (DULERA) 200-5 MCG/ACT inhaler 2 puff  2 puff Inhalation BID Tukov-Yual, Magdalene S, NP   2 puff at 11/07/17 0900  .  multivitamin with minerals tablet 1 tablet  1 tablet Oral Daily Tukov-Yual, Magdalene S, NP   1 tablet at 11/07/17 0903  . nitroGLYCERIN (NITROSTAT) SL tablet 0.4 mg  0.4 mg Sublingual Q5 min PRN Tukov-Yual, Magdalene S, NP      . OLANZapine (ZYPREXA) injection 5 mg  5 mg Intramuscular Daily PRN Tukov-Yual, Magdalene S, NP      . OLANZapine zydis (ZYPREXA) disintegrating tablet 5 mg  5 mg Oral Daily PRN Tukov-Yual, Magdalene S, NP      . ondansetron (ZOFRAN) tablet 4 mg  4 mg Oral Q6H PRN Tukov-Yual, Magdalene S, NP       Or  . ondansetron (ZOFRAN) injection 4 mg  4 mg Intravenous Q6H PRN Tukov-Yual, Magdalene S, NP      . senna-docusate (Senokot-S) tablet 1 tablet  1 tablet Oral QHS PRN  Tukov-Yual, Arlyss Gandy, NP        Musculoskeletal: Strength & Muscle Tone: decreased Gait & Station: unable to stand Patient leans: N/A  Psychiatric Specialty Exam: Physical Exam  Nursing note and vitals reviewed. Constitutional: She appears well-developed and well-nourished.  HENT:  Head: Normocephalic and atraumatic.  Eyes: Pupils are equal, round, and reactive to light. Conjunctivae are normal.  Neck: Normal range of motion.  Cardiovascular: Regular rhythm and normal heart sounds.  Respiratory: Effort normal. No respiratory distress.  GI: Soft.  Musculoskeletal: Normal range of motion.  Neurological: She is alert.  Skin: Skin is warm and dry.  Psychiatric: Her affect is labile. Her speech is tangential. She is not agitated and not aggressive. Thought content is paranoid. Cognition and memory are impaired. She expresses impulsivity. She expresses no homicidal and no suicidal ideation.    Review of Systems  Constitutional: Negative.   HENT: Negative.   Eyes: Negative.   Respiratory: Negative.   Cardiovascular: Negative.   Gastrointestinal: Negative.   Musculoskeletal: Negative.   Skin: Negative.   Neurological: Negative.   Psychiatric/Behavioral: Negative.     Blood pressure (!) 184/128, pulse 90, temperature 98.2 F (36.8 C), temperature source Oral, resp. rate (!) 22, height '5\' 6"'  (1.676 m), weight 123.9 kg, SpO2 100 %.Body mass index is 44.09 kg/m.  General Appearance: Casual  Eye Contact:  Good  Speech:  Clear and Coherent  Volume:  Normal  Mood:  Euthymic  Affect:  Congruent  Thought Process:  Coherent  Orientation:  Full (Time, Place, and Person)  Thought Content:  Rumination and Tangential  Suicidal Thoughts:  No  Homicidal Thoughts:  No  Memory:  Immediate;   Fair Recent;   Fair Remote;   Fair  Judgement:  Fair  Insight:  Fair  Psychomotor Activity:  Decreased  Concentration:  Concentration: Fair  Recall:  [employer] of Knowledge:  Fair  Language:   Fair  Akathisia:  No  Handed:  Right  AIMS (if indicated):     Assets:  Desire for Improvement Housing Social Support  ADL's:  Impaired  Cognition:  Impaired,  Mild  Sleep:        Treatment Plan Summary: Daily contact with patient to assess and evaluate symptoms and progress in treatment, Medication management and Plan Patient with chronic mental health issues and medical problems seems to be in a good state of mind considering her stresses and illness.  I understand that she is being pretty demanding of the chaplain services which is very typical for her but it sounds like she is not being overly disruptive.  She tells me that Dr.  Salary is going to send her to a rehab in [place] which sounds like an outstanding idea to me.  She is on correct and proper psychiatric medicine and although she has a tremor in her right hand she seems to be tolerating them well otherwise.  No change to any medicine I will follow-up as needed in the hospital.  Disposition: No evidence of imminent risk to self or others at present.   Patient does not meet criteria for psychiatric inpatient admission. Supportive therapy provided about ongoing stressors. Discussed crisis plan, support from social network, calling 911, coming to the Emergency Department, and calling Suicide Hotline.  Alethia Berthold, MD 11/07/2017 8:49 PM

## 2017-11-07 NOTE — Progress Notes (Signed)
Per [facility] admissions coordinator she can accept patient under long term care medicaid however she requested that Clinical Social Worker (CSW) start [facility] authorization to see if they will approve anything. CSW faxed clinicals into [facility].   CSW contacted patient's son [name] and made him aware of above. Son accepted bed offer from [facility]. Plan is for patient to D/C to [facility] tomorrow. CSW will continue to follow and assist as needed.   [employer], LCSW [phone]

## 2017-11-07 NOTE — Progress Notes (Signed)
   11/07/17 1245  Clinical Encounter Type  Visited With Patient;Health care provider  Visit Type Initial;Spiritual support  Referral From Nurse  Consult/Referral To Chaplain  Spiritual Encounters  Spiritual Needs Prayer;Emotional   [facility] received an page to visit with Ms. Pallett. I am familiar with the patient as I have spoke with her many times while she was on 1C a few months ago. Ms. Screws was upset that she was going to be sent to a rehab center. This is a pattern for Ms. Kotlyar as she was also upset about placement last hospitalization. I attempted to encourage her about the placement but she continued to cry. I departed as she was beginning a breathing treatment. I spoke with the patient's RN and shared what I observed with Ms. Dacquisto. I will follow up as needed.

## 2017-11-07 NOTE — Progress Notes (Signed)
   11/07/17 2045  Clinical Encounter Type  Visited With Patient  Visit Type Follow-up;Spiritual support  Referral From Nurse  Consult/Referral To Chaplain  Spiritual Encounters  Spiritual Needs Prayer;Emotional   [place] followed up with [name] [name]. She is still upset about being placed into a rehab facility. I tried to encourage her through laughter and prayer. [name] [name] did laugh some and seemed more content to go to a rehab if it helped her to get back home. I will follow up as needed,

## 2017-11-08 DIAGNOSIS — L8990 Pressure ulcer of unspecified site, unspecified stage: Secondary | ICD-10-CM

## 2017-11-08 LAB — GLUCOSE, CAPILLARY
GLUCOSE-CAPILLARY: 106 mg/dL — AB (ref 70–99)
Glucose-Capillary: 229 mg/dL — ABNORMAL HIGH (ref 70–99)

## 2017-11-08 MED ORDER — CARVEDILOL 12.5 MG PO TABS
12.5000 mg | ORAL_TABLET | Freq: Two times a day (BID) | ORAL | Status: DC
  Administered 2017-11-08: 12.5 mg via ORAL
  Filled 2017-11-08: qty 1

## 2017-11-08 MED ORDER — CEFDINIR 300 MG PO CAPS: 300.0000 mg | ORAL_CAPSULE | Freq: Two times a day (BID) | ORAL | 0 refills | Status: DC

## 2017-11-08 MED ORDER — OLANZAPINE 5 MG PO TBDP: 5.0000 mg | ORAL_TABLET | Freq: Every day | ORAL | 0 refills | Status: DC | PRN

## 2017-11-08 MED ORDER — CARVEDILOL 12.5 MG PO TABS: 12.5000 mg | ORAL_TABLET | Freq: Two times a day (BID) | ORAL | 0 refills | Status: AC

## 2017-11-08 MED ORDER — AMPICILLIN 500 MG PO CAPS: 500.0000 mg | ORAL_CAPSULE | Freq: Three times a day (TID) | ORAL | 0 refills | Status: DC

## 2017-11-08 NOTE — Discharge Summary (Signed)
[facility] [place] NAME: Ilynn Stauffer    MR#:  [id number]  DATE OF BIRTH:  [birth year]  DATE OF ADMISSION:  11/04/2017 ADMITTING PHYSICIAN: Arta Silence, MD  DATE OF DISCHARGE: No discharge date for patient encounter.  PRIMARY CARE PHYSICIAN: Patient, No Pcp Per    ADMISSION DIAGNOSIS:  Shortness of breath [R06.02] Hypercapnia [R06.89] Urinary tract infection without hematuria, site unspecified [N39.0]  DISCHARGE DIAGNOSIS:  Active Problems:   Acute and chronic respiratory failure with hypercapnia (HCC)   Pressure injury of skin   SECONDARY DIAGNOSIS:   Past Medical History:  Diagnosis Date  . Anxiety   . Arthritis   . Bronchitis   . Bursitis   . CAD (coronary artery disease)   . CHF (congestive heart failure) ([place])   . Chronic cough   . Chronic kidney disease    shadow on x-ray  . COPD (chronic obstructive pulmonary disease) ([place])   . Depression   . DM2 (diabetes mellitus, type 2) ([place])   . Environmental allergies   . GERD (gastroesophageal reflux disease)   . Hypercholesteremia   . Hypertension   . Left ventricular outflow tract obstruction    a. echo 03/2014: EF 60-65%, hypernamic LV systolic fxn, mod LVH w/ LVOT gradient estimated at 68 mm Hg w/ valsalva, very small LV internal cavity size, mildly increased LV posterior wall thickness, mild Ao valve scl w/o stenosis, diastolic dysfunction, normal RVSP  . Lower extremity edema   . LVH (left ventricular hypertrophy)    a. echo suggests long standing uncontrolled htn. she will not do well when dehydrated, LV cavity obliteration  . Muscle weakness   . Obesity   . On supplemental oxygen therapy    AS NEEDED  . OSA (obstructive sleep apnea)    does not use machine  . Osteoarthritis   . Schizophrenia ([place])   . Tremors of nervous system   . Wheezing     HOSPITAL COURSE:  *Acute delirium  Resolved Due to acute urinary tract infection Noted history of  mental illness Psychiatry did see patient while in house-no intervention recommended  *Acute enterococcus faecalis/E. coli UTI  Resolving Cultures/sensitivities noted Ampicillin and Omnicef for 5-day course  *Acute psychosis Stable Continue Depakote and fluphenazine  *Acute hyperkalemia Resolved  *Chronic diabetes mellitus type 2 hold Stable on current regiment  *Chronic extreme morbid obesity Lifestyle modification recommended  *Chronic immobility syndrome Stable Physical therapy to evaluate/treat  *Chronic obstructive sleep apnea Continue BiPAP 14/6, 30% FiO2, at bedtime and as needed  DISCHARGE CONDITIONS:   stable  CONSULTS OBTAINED:  Treatment Team:  Arta Silence, MD Elvin So, MD Clapacs, Madie Reno, MD  DRUG ALLERGIES:   Allergies  Allergen Reactions  . Haldol [Haloperidol Decanoate] Swelling and Other (See Comments)    Reaction:  Swelling of tongue and blurred vision    . Metformin Diarrhea  . Prednisone Other (See Comments)    Unknown reaction  . Raspberry Swelling and Other (See Comments)    Reaction:  Swelling of lips     DISCHARGE MEDICATIONS:   Allergies as of 11/08/2017      Reactions   Haldol [haloperidol Decanoate] Swelling, Other (See Comments)   Reaction:  Swelling of tongue and blurred vision     Metformin Diarrhea   Prednisone Other (See Comments)   Unknown reaction   Raspberry Swelling, Other (See Comments)   Reaction:  Swelling of lips       Medication List  TAKE these medications   acetaminophen 325 MG tablet Commonly known as:  TYLENOL Take 2 tablets (650 mg total) by mouth every 4 (four) hours as needed for mild pain (temp > 101.5).   albuterol 108 (90 Base) MCG/ACT inhaler Commonly known as:  PROVENTIL HFA;VENTOLIN HFA Inhale 2 puffs into the lungs every 6 (six) hours as needed for wheezing or shortness of breath.   ampicillin 500 MG capsule Commonly known as:  PRINCIPEN Take 1 capsule (500 mg  total) by mouth every 8 (eight) hours.   aspirin EC 81 MG tablet Take 81 mg by mouth daily.   atorvastatin 20 MG tablet Commonly known as:  LIPITOR Take 20 mg by mouth at bedtime.   benztropine 0.5 MG tablet Commonly known as:  COGENTIN Take 0.5 mg by mouth 2 (two) times daily.   carvedilol 12.5 MG tablet Commonly known as:  COREG Take 1 tablet (12.5 mg total) by mouth 2 (two) times daily. What changed:    medication strength  how much to take   cefdinir 300 MG capsule Commonly known as:  OMNICEF Take 1 capsule (300 mg total) by mouth every 12 (twelve) hours.   cetirizine 10 MG tablet Commonly known as:  ZYRTEC Take 10 mg by mouth daily.   cholecalciferol 1000 units tablet Commonly known as:  VITAMIN D Take 1,000 Units by mouth daily.   divalproex 500 MG DR tablet Commonly known as:  DEPAKOTE Take 1 tablet (500 mg total) by mouth 3 (three) times daily.   docusate sodium 100 MG capsule Commonly known as:  COLACE Take 200 mg by mouth daily.   eucerin cream Apply 1 application topically daily.   feeding supplement (PRO-STAT SUGAR FREE 64) Liqd Take 30 mLs by mouth 2 (two) times daily.   fluPHENAZine 5 MG/ML solution Commonly known as:  PROLIXIN Take 5 mg by mouth 3 (three) times daily.   fluPHENAZine decanoate 25 MG/ML injection Commonly known as:  PROLIXIN Inject 2 mLs (50 mg total) into the muscle every 14 (fourteen) days.   Fluticasone-Salmeterol 250-50 MCG/DOSE Aepb Commonly known as:  ADVAIR Inhale 1 puff into the lungs 2 (two) times daily.   furosemide 20 MG tablet Commonly known as:  LASIX Take 1 tablet (20 mg total) by mouth daily.   glipiZIDE 10 MG tablet Commonly known as:  GLUCOTROL Take 1 tablet by mouth daily.   guaiFENesin 100 MG/5ML Soln Commonly known as:  ROBITUSSIN Take 15 mLs by mouth every 6 (six) hours as needed for cough or to loosen phlegm. *Notify physician if cough persists more than 3 days*   insulin aspart 100 UNIT/ML  injection Commonly known as:  novoLOG Inject 4 Units into the skin 3 (three) times daily before meals.   insulin glargine 100 UNIT/ML injection Commonly known as:  LANTUS Inject 11 Units into the skin at bedtime.   levothyroxine 75 MCG tablet Commonly known as:  SYNTHROID, LEVOTHROID Take 1 tablet by mouth daily.   magnesium hydroxide 400 MG/5ML suspension Commonly known as:  MILK OF MAGNESIA Take 30 mLs by mouth daily as needed for mild constipation.   multivitamin with minerals Tabs tablet Take 1 tablet by mouth daily.   nitroGLYCERIN 0.4 MG SL tablet Commonly known as:  NITROSTAT Place 0.4 mg under the tongue every 5 (five) minutes as needed for chest pain.   OLANZapine zydis 5 MG disintegrating tablet Commonly known as:  ZYPREXA Take 1 tablet (5 mg total) by mouth daily as needed (physical agitation).  polyethylene glycol packet Commonly known as:  MIRALAX / GLYCOLAX Take 17 g by mouth daily as needed for mild constipation.   ranitidine 150 MG tablet Commonly known as:  ZANTAC Take 150 mg by mouth 2 (two) times daily.   tiotropium 18 MCG inhalation capsule Commonly known as:  SPIRIVA Place 1 capsule (18 mcg total) into inhaler and inhale daily.        DISCHARGE INSTRUCTIONS:  If you experience worsening of your admission symptoms, develop shortness of breath, life threatening emergency, suicidal or homicidal thoughts you must seek medical attention immediately by calling 911 or calling your MD immediately  if symptoms less severe.  You Must read complete instructions/literature along with all the possible adverse reactions/side effects for all the Medicines you take and that have been prescribed to you. Take any new Medicines after you have completely understood and accept all the possible adverse reactions/side effects.   Please note  You were cared for by a hospitalist during your hospital stay. If you have any questions about your discharge medications or the  care you received while you were in the hospital after you are discharged, you can call the unit and asked to speak with the hospitalist on call if the hospitalist that took care of you is not available. Once you are discharged, your primary care physician will handle any further medical issues. Please note that NO REFILLS for any discharge medications will be authorized once you are discharged, as it is imperative that you return to your primary care physician (or establish a relationship with a primary care physician if you do not have one) for your aftercare needs so that they can reassess your need for medications and monitor your lab values.    Today   CHIEF COMPLAINT:   Chief Complaint  Patient presents with  . Shortness of Breath    HISTORY OF PRESENT ILLNESS:  72 y.o. female with a known history of morbid obesity, T2IDDM, HTN, HLD, COPD, OSA, chronic diastolic CHF (EF > 60%, grade 1 diastolic dysfxn as of 63/02/6008 Echo), psychosis p/w CP/SOB, acute on chronic hypoxemic hypercapnoeic respiratory failure. Pt on BiPAP at the time of my assessment, lethargic, unable to stay awake or answer questions, Hx/ROS unobtainable from pt. Per ED provider, staff and documentation, pt was apparently residing at a facility until ~1wk ago, at which time her son took her home. Pt and son apparently had a verbal disagreement, which precipitated pt's CP/SOB. Pt also documented to be "weak" and "confused" at home. EMS called. At the time of my assessment, pt appears to be protecting her airway. Her lung sounds are diminished but clear. CXR (-) active cardiopulmonary disease. ABG demonstrates pH 7.38, pCO2 73, paO2 76. Her CO2 level is likely at/near baseline. She does not appear to be having active clinical COPD or CHF exacerbation. U/A (+) UTI.  VITAL SIGNS:  Blood pressure (!) 119/99, pulse 80, temperature 98.9 F (37.2 C), temperature source Oral, resp. rate 16, height 5\' 6"  (1.676 m), weight 123.9 kg, SpO2  99 %.  I/O:    Intake/Output Summary (Last 24 hours) at 11/08/2017 1136 Last data filed at 11/08/2017 1100 Gross per 24 hour  Intake 720 ml  Output 1101 ml  Net -381 ml    PHYSICAL EXAMINATION:  GENERAL:  72 y.o.-year-old patient lying in the bed with no acute distress.  EYES: Pupils equal, round, reactive to light and accommodation. No scleral icterus. Extraocular muscles intact.  HEENT: Head atraumatic, normocephalic. Oropharynx and  nasopharynx clear.  NECK:  Supple, no jugular venous distention. No thyroid enlargement, no tenderness.  LUNGS: Normal breath sounds bilaterally, no wheezing, rales,rhonchi or crepitation. No use of accessory muscles of respiration.  CARDIOVASCULAR: S1, S2 normal. No murmurs, rubs, or gallops.  ABDOMEN: Soft, non-tender, non-distended. Bowel sounds present. No organomegaly or mass.  EXTREMITIES: No pedal edema, cyanosis, or clubbing.  NEUROLOGIC: Cranial nerves II through XII are intact. Muscle strength 5/5 in all extremities. Sensation intact. Gait not checked.  PSYCHIATRIC: The patient is alert and oriented x 3.  SKIN: No obvious rash, lesion, or ulcer.   DATA REVIEW:   CBC Recent Labs  Lab 11/07/17 0719  WBC 7.5  HGB 13.7  HCT 41.8  PLT 195    Chemistries  Recent Labs  Lab 11/06/17 0358  NA 142  K 3.5  CL 96*  CO2 35*  GLUCOSE 129*  BUN 15  CREATININE 0.88  CALCIUM 9.1    Cardiac Enzymes Recent Labs  Lab 11/04/17 2053  TROPONINI <0.03    Microbiology Results  Results for orders placed or performed during the hospital encounter of 11/04/17  Urine culture     Status: Abnormal   Collection Time: 11/05/17  1:14 AM  Result Value Ref Range Status   Specimen Description   Final    URINE, RANDOM Performed at [facility], [address] [place], [place] [address]    Special Requests   Final    NONE Performed at [facility], [address] [place], [place] [address]    Culture (A)  Final     >=100,000 COLONIES/mL ENTEROCOCCUS FAECALIS 50,000 COLONIES/mL ESCHERICHIA COLI    Report Status 11/07/2017 FINAL  Final   Organism ID, Bacteria ENTEROCOCCUS FAECALIS (A)  Final   Organism ID, Bacteria ESCHERICHIA COLI (A)  Final      Susceptibility   Escherichia coli - MIC*    AMPICILLIN >=32 RESISTANT Resistant     CEFAZOLIN <=4 SENSITIVE Sensitive     CEFTRIAXONE <=1 SENSITIVE Sensitive     CIPROFLOXACIN <=0.25 SENSITIVE Sensitive     GENTAMICIN <=1 SENSITIVE Sensitive     IMIPENEM <=0.25 SENSITIVE Sensitive     NITROFURANTOIN 32 SENSITIVE Sensitive     TRIMETH/SULFA >=320 RESISTANT Resistant     AMPICILLIN/SULBACTAM >=32 RESISTANT Resistant     PIP/TAZO <=4 SENSITIVE Sensitive     Extended ESBL NEGATIVE Sensitive     * 50,000 COLONIES/mL ESCHERICHIA COLI   Enterococcus faecalis - MIC*    AMPICILLIN <=2 SENSITIVE Sensitive     LEVOFLOXACIN >=8 RESISTANT Resistant     NITROFURANTOIN <=16 SENSITIVE Sensitive     VANCOMYCIN 1 SENSITIVE Sensitive     * >=100,000 COLONIES/mL ENTEROCOCCUS FAECALIS    RADIOLOGY:  No results found.  EKG:   Orders placed or performed during the hospital encounter of 11/04/17  . ED EKG within 10 minutes  . ED EKG within 10 minutes  . EKG 12-Lead  . EKG 12-Lead      Management plans discussed with the patient, family and they are in agreement.  CODE STATUS:     Code Status Orders  (From admission, onward)         Start     Ordered   11/05/17 0517  Full code  Continuous     11/05/17 0516        Code Status History    Date Active Date Inactive Code Status Order ID Comments User Context   08/03/2017 1625 08/16/2017 1419  Full Code [id number]  Gladstone Lighter, MD Inpatient   07/02/2016 1403 07/03/2016 2031 Full Code [id number]  Charlesetta Shanks, MD ED   01/16/2016 0346 01/21/2016 1921 DNR [id number]  Lily Kocher, MD Inpatient   10/14/2015 1249 10/22/2015 2022 DNR [id number]  Laverle Hobby, MD Inpatient   10/13/2015 0201 10/14/2015  1249 Full Code [id number]  Mikael Spray, NP ED   10/29/2014 2043 11/05/2014 2003 Full Code [id number]  Gonzella Lex, MD Inpatient   20-Feb-202016 1936 08/12/2014 1635 Full Code [id number]  Sheryle Spray, RN ED   06/29/2014 1523 07/04/2014 0050 Full Code [id number]  Gladstone Lighter, MD Inpatient   06/24/2011 1832 07/01/2011 0002 Full Code [id number]  Kathie Dike, MD Inpatient      TOTAL TIME TAKING CARE OF THIS PATIENT: 40 minutes.    [name] Arleta Ostrum M.D on 11/08/2017 at 11:36 AM  Between 7am to 6pm - Pager - [phone]  After 6pm go to [email or web address] - password [email or web address] [facility]  Office  [phone]  CC: Primary care physician; Patient, No Pcp Per   Note: This dictation was prepared with Dragon dictation along with smaller phrase technology. Any transcriptional errors that result from this process are unintentional.

## 2017-11-08 NOTE — Clinical Social Work Placement (Signed)
   CLINICAL SOCIAL WORK PLACEMENT  NOTE  Date:  11/08/2017  Patient Details  Name: [name] [name] MRN: [id number] Date of Birth: July 31, 1945  Clinical Social Work is seeking post-discharge placement for this patient at the [place] level of care (*CSW will initial, date and re-position this form in  chart as items are completed):  Yes   Patient/family provided with [facility] Work Department's list of facilities offering this level of care within the geographic area requested by the patient (or if unable, by the patient's family).  Yes   Patient/family informed of their freedom to choose among providers that offer the needed level of care, that participate in Medicare, Medicaid or managed care program needed by the patient, have an available bed and are willing to accept the patient.  Yes   Patient/family informed of [facility] ownership interest in [facility] and [facility], as well as of the fact that they are under no obligation to receive care at these facilities.  PASRR submitted to EDS on       PASRR number received on       Existing PASRR number confirmed on 11/06/17     FL2 transmitted to all facilities in geographic area requested by pt/family on 11/06/17     FL2 transmitted to all facilities within larger geographic area on 11/06/17     Patient informed that his/her managed care company has contracts with or will negotiate with certain facilities, including the following:        Yes   Patient/family informed of bed offers received.  Patient chooses bed at [facility])     Physician recommends and patient chooses bed at      Patient to be transferred to [facility]) on 11/08/17.  Patient to be transferred to facility by [facility] )     Patient family notified on 11/08/17 of transfer.  Name of family member notified:  ([name] [name] is aware of D/C today. )     PHYSICIAN       Additional Comment:     _______________________________________________ Chinonso Linker, Veronia Beets, LCSW 11/08/2017, 1:39 PM

## 2017-11-08 NOTE — Progress Notes (Signed)
Family Meeting Note  Advance Directive:yes  Today a meeting took place with the Patient.  Patient is able to participate   The following clinical team members were present during this meeting:MD  The following were discussed:Patient's diagnosis:delerium, uti, mental health disease , Patient's progosis: Unable to determine and Goals for treatment: Full Code  Additional follow-up to be provided: prn  Time spent during discussion:20 minutes  Gorden Harms, MD

## 2017-11-08 NOTE — Progress Notes (Signed)
Chaplain rounding and followed up with Pt. Son and son's client was at the bedside. Pt said "they are ship me off" to rehab facility. Chaplain changed narrative to discharge to a care facility. They will help you walk again. Pt asked for rock that Chaplain shared on a previous visit. Praying that the energy from the rock found in [place] will be helpful to the Pt. Chaplain prayed for Pt, family and care team.    11/08/17 1000  Clinical Encounter Type  Visited With Patient and family together  Visit Type Follow-up;Spiritual support  Referral From [place] Needs Prayer;Emotional

## 2017-11-08 NOTE — Progress Notes (Signed)
Report called and given to [facility] at [facility]. EMS called for transport. IV removed. Pt dressed and awaiting on transport.

## 2017-11-08 NOTE — Progress Notes (Signed)
Physical Therapy Treatment Patient Details Name: [name] [name] MRN: [id number] DOB: August 04, 1945 Today's Date: 11/08/2017    History of Present Illness 72 y.o. female presenting to [facility] with SOB. PMH includes DM, HTN, HLD, tremors, schizophrenia, anxiety, arthritis, bronchitis, CAD, CHF, chronic cough, COPD, GERD, and left ventricular hypertrophy.    PT Comments    Pt requiring multiple attempts to stand with RW and assist; posterior lean noted with standing requiring cueing and assist to shift weight forward.  Pt able to take a few steps in place and side step a few feet to R with RW with assist.  Pt's HR increased from 90 bpm to 121 bpm with activity (returned to 86-92 bpm end of session resting in bed) and SOB noted with activity.  Discharge recommendations updated to STR (per chart plan for discharge to STR).    Follow Up Recommendations  SNF     Equipment Recommendations  Rolling walker with 5" wheels    Recommendations for Other Services       Precautions / Restrictions Precautions Precautions: Fall Restrictions Weight Bearing Restrictions: No    Mobility  Bed Mobility Overal bed mobility: Needs Assistance Bed Mobility: Supine to Sit;Sit to Supine     Supine to sit: Mod assist Sit to supine: Min guard   General bed mobility comments: mod assist for trunk semi-supine to sit; CGA sit to supine for safety (2 assist to boost pt up in bed with bed sheet)  Transfers Overall transfer level: Needs assistance Equipment used: Rolling walker (2 wheeled) Transfers: Sit to/from Stand Sit to Stand: Min assist;Mod assist         General transfer comment: pt requiring 3 attempts to stand 1st trial (mod assist to come to full stand) and 2 attempts to stand 2nd trial (min assist to stand); vc's for UE/LE placement and leaning forward to stand initially  Ambulation/Gait Ambulation/Gait assistance: Min assist   Assistive device: Rolling walker (2 wheeled)   Gait velocity:  decreased   General Gait Details: pt able to take a few steps in place and then side-step a few feet to R with RW and assist; limited d/t SOB   Stairs             Wheelchair Mobility    Modified Rankin (Stroke Patients Only)       Balance Overall balance assessment: Needs assistance Sitting-balance support: No upper extremity supported;Feet supported Sitting balance-Leahy Scale: Good Sitting balance - Comments: steady sitting reaching within BOS   Standing balance support: Bilateral upper extremity supported Standing balance-Leahy Scale: Poor Standing balance comment: requires B UE support for static balance (posterior lean noted initially with standing requiring assist to shift weight forward)                            Cognition Arousal/Alertness: Awake/alert Behavior During Therapy: WFL for tasks assessed/performed Overall Cognitive Status: Within Functional Limits for tasks assessed                                        Exercises      General Comments   Nursing cleared pt for participation in physical therapy.  Pt agreeable to PT session and motivated to attempt to walk.      Pertinent Vitals/Pain Pain Assessment: No/denies pain Pain Intervention(s): Limited activity within patient's tolerance;Monitored during session;Repositioned  Home Living                      Prior Function            PT Goals (current goals can now be found in the care plan section) Acute Rehab PT Goals Patient Stated Goal: to walk PT Goal Formulation: With patient Time For Goal Achievement: 11/20/17 Potential to Achieve Goals: Good Progress towards PT goals: Progressing toward goals    Frequency    Min 2X/week      PT Plan Discharge plan needs to be updated    Co-evaluation              AM-PAC PT "6 Clicks" Daily Activity  Outcome Measure  Difficulty turning over in bed (including adjusting bedclothes, sheets and  blankets)?: Unable Difficulty moving from lying on back to sitting on the side of the bed? : Unable Difficulty sitting down on and standing up from a chair with arms (e.g., wheelchair, bedside commode, etc,.)?: Unable Help needed moving to and from a bed to chair (including a wheelchair)?: A Lot Help needed walking in hospital room?: Total Help needed climbing 3-5 steps with a railing? : Total 6 Click Score: 7    End of Session Equipment Utilized During Treatment: Gait belt Activity Tolerance: Patient limited by fatigue Patient left: in bed;with call bell/phone within reach;with bed alarm set Nurse Communication: Mobility status;Precautions;Other (comment)(Pt's HR during session) PT Visit Diagnosis: Unsteadiness on feet (R26.81);Other abnormalities of gait and mobility (R26.89);History of falling (Z91.81);Muscle weakness (generalized) (M62.81)     Time: 7588-3254 PT Time Calculation (min) (ACUTE ONLY): 23 min  Charges:  $Therapeutic Activity: 23-37 mins                    Leitha Bleak, PT 11/08/17, 12:32 PM [phone]

## 2017-11-08 NOTE — Progress Notes (Signed)
Per [facility] admissions coordinator she will be able to get bi-pap at facility today. Patient can D/C to [facility] when medically stable.   [employer], LCSW [phone]

## 2017-11-08 NOTE — Progress Notes (Signed)
Patient is medically stable for D/C to [facility] today. Per [facility] admissions coordinator patient can come today to room 209 and they have a bi-pap. [employer] [facility] authorization has been received. RN will call report and arrange EMS for transport. Clinical Education officer, museum (CSW) sent D/C orders to [employer] via [facility]. Patient is aware of above. CSW contacted patient's son [name] and made him aware of above. Please reconsult if future social work needs arise. CSW signing off.   [employer], LCSW [phone]

## 2017-11-08 NOTE — Plan of Care (Signed)
  Problem: Education: Goal: Knowledge of General Education information will improve Description Including pain rating scale, medication(s)/side effects and non-pharmacologic comfort measures Outcome: Progressing   

## 2017-11-08 NOTE — Care Management Important Message (Signed)
Initial Medicare IM signed by [name], son by Admitting & Registration in room.  SWA also spoke with son over phone to review right.

## 2017-11-11 LAB — BLOOD GAS, VENOUS
Acid-Base Excess: 14.7 mmol/L — ABNORMAL HIGH (ref 0.0–2.0)
Acid-Base Excess: 14.7 mmol/L — ABNORMAL HIGH (ref 0.0–2.0)
BICARBONATE: 44.1 mmol/L — AB (ref 20.0–28.0)
Bicarbonate: 44.8 mmol/L — ABNORMAL HIGH (ref 20.0–28.0)
Delivery systems: POSITIVE
FIO2: 0.3
PATIENT TEMPERATURE: 37
PH VEN: 7.33 (ref 7.250–7.430)
Patient temperature: 37
pCO2, Ven: 78 mmHg (ref 44.0–60.0)
pCO2, Ven: 85 mmHg (ref 44.0–60.0)
pH, Ven: 7.36 (ref 7.250–7.430)

## 2018-02-14 ENCOUNTER — Observation Stay
Admission: EM | Admit: 2018-02-14 | Discharge: 2018-02-16 | Disposition: A | Discharge status: Home Health | Payer: Medicare HMO | Attending: Internal Medicine | Admitting: Internal Medicine

## 2018-02-14 ENCOUNTER — Other Ambulatory Visit: Payer: Self-pay

## 2018-02-14 ENCOUNTER — Encounter: Payer: Self-pay | Admitting: Emergency Medicine

## 2018-02-14 DIAGNOSIS — Z888 Allergy status to other drugs, medicaments and biological substances status: Secondary | ICD-10-CM | POA: Insufficient documentation

## 2018-02-14 DIAGNOSIS — Z96659 Presence of unspecified artificial knee joint: Secondary | ICD-10-CM | POA: Diagnosis not present

## 2018-02-14 DIAGNOSIS — N390 Urinary tract infection, site not specified: Principal | ICD-10-CM | POA: Insufficient documentation

## 2018-02-14 DIAGNOSIS — I130 Hypertensive heart and chronic kidney disease with heart failure and stage 1 through stage 4 chronic kidney disease, or unspecified chronic kidney disease: Secondary | ICD-10-CM | POA: Diagnosis not present

## 2018-02-14 DIAGNOSIS — F250 Schizoaffective disorder, bipolar type: Secondary | ICD-10-CM | POA: Diagnosis not present

## 2018-02-14 DIAGNOSIS — Z87891 Personal history of nicotine dependence: Secondary | ICD-10-CM | POA: Insufficient documentation

## 2018-02-14 DIAGNOSIS — E1122 Type 2 diabetes mellitus with diabetic chronic kidney disease: Secondary | ICD-10-CM | POA: Insufficient documentation

## 2018-02-14 DIAGNOSIS — J449 Chronic obstructive pulmonary disease, unspecified: Secondary | ICD-10-CM | POA: Insufficient documentation

## 2018-02-14 DIAGNOSIS — F419 Anxiety disorder, unspecified: Secondary | ICD-10-CM | POA: Diagnosis not present

## 2018-02-14 DIAGNOSIS — I5032 Chronic diastolic (congestive) heart failure: Secondary | ICD-10-CM | POA: Diagnosis not present

## 2018-02-14 DIAGNOSIS — E785 Hyperlipidemia, unspecified: Secondary | ICD-10-CM | POA: Insufficient documentation

## 2018-02-14 DIAGNOSIS — G4733 Obstructive sleep apnea (adult) (pediatric): Secondary | ICD-10-CM | POA: Insufficient documentation

## 2018-02-14 DIAGNOSIS — B9689 Other specified bacterial agents as the cause of diseases classified elsewhere: Secondary | ICD-10-CM | POA: Insufficient documentation

## 2018-02-14 DIAGNOSIS — Z8249 Family history of ischemic heart disease and other diseases of the circulatory system: Secondary | ICD-10-CM | POA: Insufficient documentation

## 2018-02-14 DIAGNOSIS — Z79899 Other long term (current) drug therapy: Secondary | ICD-10-CM | POA: Insufficient documentation

## 2018-02-14 DIAGNOSIS — E039 Hypothyroidism, unspecified: Secondary | ICD-10-CM | POA: Diagnosis not present

## 2018-02-14 DIAGNOSIS — R4182 Altered mental status, unspecified: Secondary | ICD-10-CM | POA: Diagnosis present

## 2018-02-14 DIAGNOSIS — Z7982 Long term (current) use of aspirin: Secondary | ICD-10-CM | POA: Insufficient documentation

## 2018-02-14 DIAGNOSIS — I2510 Atherosclerotic heart disease of native coronary artery without angina pectoris: Secondary | ICD-10-CM | POA: Diagnosis not present

## 2018-02-14 DIAGNOSIS — R531 Weakness: Secondary | ICD-10-CM | POA: Diagnosis present

## 2018-02-14 DIAGNOSIS — Z794 Long term (current) use of insulin: Secondary | ICD-10-CM | POA: Insufficient documentation

## 2018-02-14 DIAGNOSIS — E1165 Type 2 diabetes mellitus with hyperglycemia: Secondary | ICD-10-CM | POA: Diagnosis not present

## 2018-02-14 DIAGNOSIS — Z23 Encounter for immunization: Secondary | ICD-10-CM | POA: Insufficient documentation

## 2018-02-14 DIAGNOSIS — N189 Chronic kidney disease, unspecified: Secondary | ICD-10-CM | POA: Insufficient documentation

## 2018-02-14 DIAGNOSIS — K219 Gastro-esophageal reflux disease without esophagitis: Secondary | ICD-10-CM | POA: Diagnosis not present

## 2018-02-14 DIAGNOSIS — N2889 Other specified disorders of kidney and ureter: Secondary | ICD-10-CM | POA: Diagnosis not present

## 2018-02-14 DIAGNOSIS — Z9049 Acquired absence of other specified parts of digestive tract: Secondary | ICD-10-CM | POA: Insufficient documentation

## 2018-02-14 LAB — URINALYSIS, COMPLETE (UACMP) WITH MICROSCOPIC
Bilirubin Urine: NEGATIVE
Glucose, UA: NEGATIVE mg/dL
Ketones, ur: NEGATIVE mg/dL
Nitrite: POSITIVE — AB
PROTEIN: NEGATIVE mg/dL
Specific Gravity, Urine: 1.015 (ref 1.005–1.030)
pH: 5 (ref 5.0–8.0)

## 2018-02-14 LAB — CBC
HCT: 40 % (ref 36.0–46.0)
HEMATOCRIT: 42.3 % (ref 36.0–46.0)
HEMOGLOBIN: 12.3 g/dL (ref 12.0–15.0)
Hemoglobin: 12.8 g/dL (ref 12.0–15.0)
MCH: 29.1 pg (ref 26.0–34.0)
MCH: 29.1 pg (ref 26.0–34.0)
MCHC: 30.8 g/dL (ref 30.0–36.0)
MCHC: 30.3 g/dL (ref 30.0–36.0)
MCV: 94.6 fL (ref 80.0–100.0)
MCV: 96.1 fL (ref 80.0–100.0)
NRBC: 0 % (ref 0.0–0.2)
PLATELETS: 187 10*3/uL (ref 150–400)
Platelets: 172 10*3/uL (ref 150–400)
RBC: 4.23 MIL/uL (ref 3.87–5.11)
RBC: 4.4 MIL/uL (ref 3.87–5.11)
RDW: 14.4 % (ref 11.5–15.5)
RDW: 14.2 % (ref 11.5–15.5)
WBC: 8.5 10*3/uL (ref 4.0–10.5)
WBC: 9.7 10*3/uL (ref 4.0–10.5)
nRBC: 0 % (ref 0.0–0.2)

## 2018-02-14 LAB — COMPREHENSIVE METABOLIC PANEL
ALK PHOS: 50 U/L (ref 38–126)
ALT: 15 U/L (ref 0–44)
AST: 21 U/L (ref 15–41)
Albumin: 3 g/dL — ABNORMAL LOW (ref 3.5–5.0)
Anion gap: 9 (ref 5–15)
BUN: 25 mg/dL — AB (ref 8–23)
CHLORIDE: 94 mmol/L — AB (ref 98–111)
CO2: 37 mmol/L — ABNORMAL HIGH (ref 22–32)
CREATININE: 1.22 mg/dL — AB (ref 0.44–1.00)
Calcium: 9.1 mg/dL (ref 8.9–10.3)
GFR calc Af Amer: 51 mL/min — ABNORMAL LOW (ref >60)
GFR, EST NON AFRICAN AMERICAN: 44 mL/min — AB (ref >60)
Glucose, Bld: 129 mg/dL — ABNORMAL HIGH (ref 70–99)
Potassium: 4 mmol/L (ref 3.5–5.1)
Sodium: 140 mmol/L (ref 135–145)
Total Bilirubin: 0.6 mg/dL (ref 0.3–1.2)
Total Protein: 7.2 g/dL (ref 6.5–8.1)

## 2018-02-14 LAB — CREATININE, SERUM
Creatinine, Ser: 1.22 mg/dL — ABNORMAL HIGH (ref 0.44–1.00)
GFR calc Af Amer: 51 mL/min — ABNORMAL LOW (ref >60)
GFR, EST NON AFRICAN AMERICAN: 44 mL/min — AB (ref >60)

## 2018-02-14 LAB — GLUCOSE, CAPILLARY
Glucose-Capillary: 225 mg/dL — ABNORMAL HIGH (ref 70–99)
Glucose-Capillary: 138 mg/dL — ABNORMAL HIGH (ref 70–99)

## 2018-02-14 LAB — TROPONIN I

## 2018-02-14 MED ORDER — INSULIN ASPART 100 UNIT/ML ~~LOC~~ SOLN
0.0000 [IU] | Freq: Three times a day (TID) | SUBCUTANEOUS | Status: DC
  Administered 2018-02-15: 08:00:00 3 [IU] via SUBCUTANEOUS
  Administered 2018-02-15: 2 [IU] via SUBCUTANEOUS
  Administered 2018-02-15: 12:00:00 5 [IU] via SUBCUTANEOUS
  Administered 2018-02-16: 12:00:00 3 [IU] via SUBCUTANEOUS
  Administered 2018-02-16: 2 [IU] via SUBCUTANEOUS
  Filled 2018-02-14 (×5): qty 1

## 2018-02-14 MED ORDER — FUROSEMIDE 20 MG PO TABS
20.0000 mg | ORAL_TABLET | Freq: Every day | ORAL | Status: DC
  Administered 2018-02-14 – 2018-02-16 (×3): 20 mg via ORAL
  Filled 2018-02-14 (×3): qty 1

## 2018-02-14 MED ORDER — HYDRALAZINE HCL 20 MG/ML IJ SOLN: 5.0000 mg | INTRAMUSCULAR | Status: DC | PRN

## 2018-02-14 MED ORDER — MOMETASONE FURO-FORMOTEROL FUM 200-5 MCG/ACT IN AERO
2.0000 | INHALATION_SPRAY | Freq: Two times a day (BID) | RESPIRATORY_TRACT | Status: DC
  Administered 2018-02-14 – 2018-02-16 (×4): 2 via RESPIRATORY_TRACT
  Filled 2018-02-14: qty 8.8

## 2018-02-14 MED ORDER — DIVALPROEX SODIUM 500 MG PO DR TAB
500.0000 mg | DELAYED_RELEASE_TABLET | Freq: Three times a day (TID) | ORAL | Status: DC
  Administered 2018-02-14 – 2018-02-16 (×5): 500 mg via ORAL
  Filled 2018-02-14 (×7): qty 1

## 2018-02-14 MED ORDER — ACETAMINOPHEN 650 MG RE SUPP: 650.0000 mg | Freq: Four times a day (QID) | RECTAL | Status: DC | PRN

## 2018-02-14 MED ORDER — ONDANSETRON HCL 4 MG PO TABS: 4.0000 mg | ORAL_TABLET | Freq: Four times a day (QID) | ORAL | Status: DC | PRN

## 2018-02-14 MED ORDER — ATORVASTATIN CALCIUM 20 MG PO TABS
20.0000 mg | ORAL_TABLET | Freq: Every day | ORAL | Status: DC
  Administered 2018-02-14 – 2018-02-15 (×2): 20 mg via ORAL
  Filled 2018-02-14 (×2): qty 1

## 2018-02-14 MED ORDER — POLYETHYLENE GLYCOL 3350 17 G PO PACK: 17.0000 g | PACK | Freq: Every day | ORAL | Status: DC | PRN

## 2018-02-14 MED ORDER — INSULIN ASPART 100 UNIT/ML ~~LOC~~ SOLN: 0.0000 [IU] | Freq: Every day | SUBCUTANEOUS | Status: DC

## 2018-02-14 MED ORDER — MAGNESIUM HYDROXIDE 400 MG/5ML PO SUSP
30.0000 mL | Freq: Every day | ORAL | Status: DC | PRN
  Filled 2018-02-14: qty 30

## 2018-02-14 MED ORDER — ACETAMINOPHEN 325 MG PO TABS: 650.0000 mg | ORAL_TABLET | Freq: Four times a day (QID) | ORAL | Status: DC | PRN

## 2018-02-14 MED ORDER — ASPIRIN EC 81 MG PO TBEC
81.0000 mg | DELAYED_RELEASE_TABLET | Freq: Every day | ORAL | Status: DC
  Administered 2018-02-15 – 2018-02-16 (×2): 81 mg via ORAL
  Filled 2018-02-14 (×2): qty 1

## 2018-02-14 MED ORDER — FAMOTIDINE 20 MG PO TABS
20.0000 mg | ORAL_TABLET | Freq: Two times a day (BID) | ORAL | Status: DC
  Administered 2018-02-14 – 2018-02-16 (×4): 20 mg via ORAL
  Filled 2018-02-14 (×4): qty 1

## 2018-02-14 MED ORDER — SODIUM CHLORIDE 0.9 % IV SOLN
1.0000 g | Freq: Once | INTRAVENOUS | Status: AC
  Administered 2018-02-14: 1 g via INTRAVENOUS
  Filled 2018-02-14: qty 10

## 2018-02-14 MED ORDER — ALBUTEROL SULFATE (2.5 MG/3ML) 0.083% IN NEBU: 3.0000 mL | INHALATION_SOLUTION | Freq: Four times a day (QID) | RESPIRATORY_TRACT | Status: DC | PRN

## 2018-02-14 MED ORDER — ENOXAPARIN SODIUM 40 MG/0.4ML ~~LOC~~ SOLN
40.0000 mg | SUBCUTANEOUS | Status: DC
  Administered 2018-02-14 – 2018-02-15 (×2): 40 mg via SUBCUTANEOUS
  Filled 2018-02-14 (×2): qty 0.4

## 2018-02-14 MED ORDER — OLANZAPINE 5 MG PO TBDP
5.0000 mg | ORAL_TABLET | Freq: Every day | ORAL | Status: DC | PRN
  Filled 2018-02-14: qty 1

## 2018-02-14 MED ORDER — FLUPHENAZINE HCL 5 MG PO TABS
5.0000 mg | ORAL_TABLET | Freq: Three times a day (TID) | ORAL | Status: DC
  Administered 2018-02-14 – 2018-02-16 (×5): 5 mg via ORAL
  Filled 2018-02-14 (×7): qty 1

## 2018-02-14 MED ORDER — BENZTROPINE MESYLATE 0.5 MG PO TABS
0.5000 mg | ORAL_TABLET | Freq: Two times a day (BID) | ORAL | Status: DC
  Administered 2018-02-14 – 2018-02-16 (×4): 0.5 mg via ORAL
  Filled 2018-02-14 (×5): qty 1

## 2018-02-14 MED ORDER — LORATADINE 10 MG PO TABS
10.0000 mg | ORAL_TABLET | Freq: Every day | ORAL | Status: DC
  Administered 2018-02-15 – 2018-02-16 (×2): 10 mg via ORAL
  Filled 2018-02-14 (×2): qty 1

## 2018-02-14 MED ORDER — SODIUM CHLORIDE 0.9 % IV SOLN
1.0000 g | INTRAVENOUS | Status: DC
  Administered 2018-02-15 – 2018-02-16 (×2): 1 g via INTRAVENOUS
  Filled 2018-02-14 (×2): qty 1

## 2018-02-14 MED ORDER — TIOTROPIUM BROMIDE MONOHYDRATE 18 MCG IN CAPS
18.0000 ug | ORAL_CAPSULE | Freq: Every day | RESPIRATORY_TRACT | Status: DC
  Administered 2018-02-15 – 2018-02-16 (×2): 18 ug via RESPIRATORY_TRACT
  Filled 2018-02-14: qty 5

## 2018-02-14 MED ORDER — LEVOTHYROXINE SODIUM 50 MCG PO TABS
75.0000 ug | ORAL_TABLET | Freq: Every day | ORAL | Status: DC
  Administered 2018-02-15 – 2018-02-16 (×2): 75 ug via ORAL
  Filled 2018-02-14 (×2): qty 1

## 2018-02-14 MED ORDER — CARVEDILOL 3.125 MG PO TABS
12.5000 mg | ORAL_TABLET | Freq: Two times a day (BID) | ORAL | Status: DC
  Administered 2018-02-14 – 2018-02-16 (×4): 12.5 mg via ORAL
  Filled 2018-02-14 (×4): qty 4

## 2018-02-14 MED ORDER — INSULIN GLARGINE 100 UNIT/ML ~~LOC~~ SOLN
5.0000 [IU] | Freq: Every day | SUBCUTANEOUS | Status: DC
  Administered 2018-02-14 – 2018-02-15 (×2): 5 [IU] via SUBCUTANEOUS
  Filled 2018-02-14 (×3): qty 0.05

## 2018-02-14 MED ORDER — ONDANSETRON HCL 4 MG/2ML IJ SOLN: 4.0000 mg | Freq: Four times a day (QID) | INTRAMUSCULAR | Status: DC | PRN

## 2018-02-14 MED ORDER — DOCUSATE SODIUM 100 MG PO CAPS
200.0000 mg | ORAL_CAPSULE | Freq: Every day | ORAL | Status: DC
  Administered 2018-02-15 – 2018-02-16 (×2): 200 mg via ORAL
  Filled 2018-02-14 (×2): qty 2

## 2018-02-14 NOTE — Progress Notes (Signed)
   02/14/18 2200  Clinical Encounter Type  Visited With Patient  Visit Type Initial;Spiritual support  Referral From Nurse  Spiritual Encounters  Spiritual Needs Emotional;Prayer  Jefferson responded to patient request for prayer; Ch offered emotional and spiritual support along with prayer

## 2018-02-14 NOTE — ED Triage Notes (Signed)
Pt presents to ED via [facility] with c/o possible UTI. Per EMS pt's family reports that patient has had increased confusion and foul smelling urine over the last few days.

## 2018-02-14 NOTE — ED Notes (Signed)
Megan RN, aware of bed assigned

## 2018-02-14 NOTE — Progress Notes (Signed)
Family Meeting Note  Advance Directive:no  Today a meeting took place with the Patient.  Patient is somewhat able to participate.  The following clinical team members were present during this meeting:MD  The following were discussed:Patient's diagnosis: , Patient's progosis: Unable to determine and Goals for treatment: Full Code   Discussed code status with patient. She did require a lot of explanation. Unsure if she understood the conversation. She states she would like to be saved if it came down to it. Will make patient full code.  Additional follow-up to be provided: prn  Time spent during discussion:20 minutes  Evette Doffing, MD

## 2018-02-14 NOTE — H&P (Signed)
[place] [facility] [place] NAME: [name] [name]    MR#:  [id number]  DATE OF BIRTH:  01-26-1946  DATE OF ADMISSION:  02/14/2018  PRIMARY CARE PHYSICIAN: Patient, No Pcp Per   REQUESTING/REFERRING PHYSICIAN: Harvest Dark, MD  CHIEF COMPLAINT:   Chief Complaint  Patient presents with  . Urinary Tract Infection    HISTORY OF PRESENT ILLNESS:  [name] [name]  is a 72 y.o. female with a known history of hypertension, type 2 diabetes, hyperlipidemia, chronic diastolic congestive heart failure, CAD, schizophrenia who presented to the ED with altered mental status.  Per EMS report, she has had worsening confusion over the last couple of days.  The confusion comes and goes.  The family has also noticed some weakness and states that the patient is not walking as well as she used to.  She endorses dysuria, frequency, urgency for 1 week.  She denies any fevers, chills, suprapubic abdominal pain, or flank pain.  She also endorses cough that has started since she came to the ED.  In the ED, vitals were unremarkable.  Labs are significant for creatinine 1.22.  UA with small hemoglobin, large leukocytes, positive nitrates, >50 WBCs.  She was given ceftriaxone.  Hospitalists were called for admission.  PAST MEDICAL HISTORY:   Past Medical History:  Diagnosis Date  . Anxiety   . Arthritis   . Bronchitis   . Bursitis   . CAD (coronary artery disease)   . CHF (congestive heart failure) ([place])   . Chronic cough   . Chronic kidney disease    shadow on x-ray  . COPD (chronic obstructive pulmonary disease) ([place])   . Depression   . DM2 (diabetes mellitus, type 2) ([place])   . Environmental allergies   . GERD (gastroesophageal reflux disease)   . Hypercholesteremia   . Hypertension   . Left ventricular outflow tract obstruction    a. echo 03/2014: EF 60-65%, hypernamic LV systolic fxn, mod LVH w/ LVOT gradient estimated at 68 mm Hg w/ valsalva, very small LV  internal cavity size, mildly increased LV posterior wall thickness, mild Ao valve scl w/o stenosis, diastolic dysfunction, normal RVSP  . Lower extremity edema   . LVH (left ventricular hypertrophy)    a. echo suggests long standing uncontrolled htn. she will not do well when dehydrated, LV cavity obliteration  . Muscle weakness   . Obesity   . On supplemental oxygen therapy    AS NEEDED  . OSA (obstructive sleep apnea)    does not use machine  . Osteoarthritis   . Schizophrenia ([place])   . Tremors of nervous system   . Wheezing     PAST SURGICAL HISTORY:   Past Surgical History:  Procedure Laterality Date  . CATARACT EXTRACTION W/PHACO Left 09/10/2014   Procedure: CATARACT EXTRACTION PHACO AND INTRAOCULAR LENS PLACEMENT (IOC);  Surgeon: Birder Robson, MD;  Location: [facility] ORS;  Service: Ophthalmology;  Laterality: Left;  [place]: 00:44 AP%: 22.8 CDE: 10.24 Fluid lot #[id number]  . CATARACT EXTRACTION W/PHACO Right 10/15/2014   Procedure: CATARACT EXTRACTION PHACO AND INTRAOCULAR LENS PLACEMENT (IOC);  Surgeon: Birder Robson, MD;  Location: [facility] ORS;  Service: Ophthalmology;  Laterality: Right;  [place]: 00:52 AP:40.1 CDE:11.83 LOT PACK #[id number]  . CHOLECYSTECTOMY    . COLONOSCOPY  04/2011   [facility] per patient incomplete  . EYE SURGERY    . JOINT REPLACEMENT     TKR  . KNEE RECONSTRUCTION, MEDIAL PATELLAR FEMORAL LIGAMENT    .  RENAL BIOPSY    . TONSILLECTOMY    . TONSILLECTOMY      SOCIAL HISTORY:   Social History   Tobacco Use  . Smoking status: Former Smoker    Packs/day: 3.00    Years: 40.00    Pack years: 120.00  . Smokeless tobacco: Never Used  . Tobacco comment: quit in 2009  Substance Use Topics  . Alcohol use: No    Comment: occ.    FAMILY HISTORY:   Family History  Problem Relation Age of Onset  . Heart attack Mother   . Colon cancer Neg Hx   . Liver disease Neg Hx     DRUG ALLERGIES:   Allergies  Allergen Reactions  . Haldol [Haloperidol Decanoate]  Swelling and Other (See Comments)    Reaction:  Swelling of tongue and blurred vision    . Metformin Diarrhea  . Prednisone Other (See Comments)    Unknown reaction  . Raspberry Swelling and Other (See Comments)    Reaction:  Swelling of lips     REVIEW OF SYSTEMS:   Review of Systems  Constitutional: Negative for chills and fever.  HENT: Negative for congestion and sore throat.   Eyes: Negative for blurred vision and double vision.  Respiratory: Positive for cough. Negative for sputum production and shortness of breath.   Cardiovascular: Negative for chest pain, palpitations and leg swelling.  Gastrointestinal: Negative for abdominal pain, nausea and vomiting.  Genitourinary: Positive for dysuria, frequency and urgency. Negative for flank pain.  Musculoskeletal: Negative for back pain, myalgias and neck pain.  Neurological: Negative for dizziness and headaches.  Psychiatric/Behavioral: Negative for depression. The patient is not nervous/anxious.     MEDICATIONS AT HOME:   Prior to Admission medications   Medication Sig Start Date End Date Taking? Authorizing Provider  acetaminophen (TYLENOL) 325 MG tablet Take 2 tablets (650 mg total) by mouth every 4 (four) hours as needed for mild pain (temp > 101.5). 10/20/15   Gladstone Lighter, MD  albuterol (PROVENTIL HFA;VENTOLIN HFA) 108 (90 Base) MCG/ACT inhaler Inhale 2 puffs into the lungs every 6 (six) hours as needed for wheezing or shortness of breath.    [provider]  Amino Acids-Protein Hydrolys (FEEDING SUPPLEMENT, PRO-STAT SUGAR FREE 64,) LIQD Take 30 mLs by mouth 2 (two) times daily.    [provider]  ampicillin (PRINCIPEN) 500 MG capsule Take 1 capsule (500 mg total) by mouth every 8 (eight) hours. 11/08/17   Salary, Holly Bodily D, MD  aspirin EC 81 MG tablet Take 81 mg by mouth daily.    [provider]  atorvastatin (LIPITOR) 20 MG tablet Take 20 mg by mouth at bedtime.    [provider]    benztropine (COGENTIN) 0.5 MG tablet Take 0.5 mg by mouth 2 (two) times daily.    [provider]  carvedilol (COREG) 12.5 MG tablet Take 1 tablet (12.5 mg total) by mouth 2 (two) times daily. 11/08/17   Salary, [name], MD  cefdinir (OMNICEF) 300 MG capsule Take 1 capsule (300 mg total) by mouth every 12 (twelve) hours. 11/08/17   Salary, [name], MD  cetirizine (ZYRTEC) 10 MG tablet Take 10 mg by mouth daily.    [provider]  cholecalciferol (VITAMIN D) 1000 units tablet Take 1,000 Units by mouth daily.    [provider]  divalproex (DEPAKOTE) 500 MG DR tablet Take 1 tablet (500 mg total) by mouth 3 (three) times daily. 08/15/17   Loletha Grayer, MD  docusate sodium (COLACE) 100 MG capsule Take 200 mg by mouth daily.    [provider]  fluPHENAZine (PROLIXIN) 5 MG/ML solution Take 5 mg by mouth 3 (three) times daily.    [provider]  fluPHENAZine decanoate (PROLIXIN) 25 MG/ML injection Inject 2 mLs (50 mg total) into the muscle every 14 (fourteen) days. 08/25/17   Loletha Grayer, MD  Fluticasone-Salmeterol (ADVAIR) 250-50 MCG/DOSE AEPB Inhale 1 puff into the lungs 2 (two) times daily.    [provider]  furosemide (LASIX) 20 MG tablet Take 1 tablet (20 mg total) by mouth daily. 08/15/17   Loletha Grayer, MD  glipiZIDE (GLUCOTROL) 10 MG tablet Take 1 tablet by mouth daily. 04/06/17   [provider]  guaiFENesin (ROBITUSSIN) 100 MG/5ML SOLN Take 15 mLs by mouth every 6 (six) hours as needed for cough or to loosen phlegm. *Notify physician if cough persists more than 3 days*    [provider]  insulin aspart (NOVOLOG) 100 UNIT/ML injection Inject 4 Units into the skin 3 (three) times daily before meals. 08/15/17   Loletha Grayer, MD  insulin glargine (LANTUS) 100 UNIT/ML injection Inject 11 Units into the skin at bedtime.     [provider]  levothyroxine (SYNTHROID, LEVOTHROID) 75 MCG tablet Take 1  tablet by mouth daily. 04/05/17   [provider]  magnesium hydroxide (MILK OF MAGNESIA) 400 MG/5ML suspension Take 30 mLs by mouth daily as needed for mild constipation.    [provider]  Multiple Vitamin (MULTIVITAMIN WITH MINERALS) TABS tablet Take 1 tablet by mouth daily.    [provider]  nitroGLYCERIN (NITROSTAT) 0.4 MG SL tablet Place 0.4 mg under the tongue every 5 (five) minutes as needed for chest pain.    [provider]  OLANZapine zydis (ZYPREXA) 5 MG disintegrating tablet Take 1 tablet (5 mg total) by mouth daily as needed (physical agitation). 11/08/17   Salary, [name], MD  polyethylene glycol (MIRALAX / GLYCOLAX) packet Take 17 g by mouth daily as needed for mild constipation. 08/15/17   Loletha Grayer, MD  ranitidine (ZANTAC) 150 MG tablet Take 150 mg by mouth 2 (two) times daily.    [provider]  Skin Protectants, Misc. (EUCERIN) cream Apply 1 application topically daily.    [provider]  tiotropium (SPIRIVA) 18 MCG inhalation capsule Place 1 capsule (18 mcg total) into inhaler and inhale daily. 07/02/14   [name], MD      VITAL SIGNS:  Blood pressure (!) 155/88, pulse 67, temperature 98.5 F (36.9 C), temperature source Oral, resp. rate (!) 23, height 5\' 8"  (1.727 m), weight 123.9 kg, SpO2 97 %.  PHYSICAL EXAMINATION:  Physical Exam  GENERAL:  72 y.o.-year-old patient lying in the bed with no acute distress.  EYES: Pupils equal, round, reactive to light and accommodation. No scleral icterus. Extraocular muscles intact.  HEENT: Head atraumatic, normocephalic. Oropharynx and nasopharynx clear.  Moist mucous membranes. NECK:  Supple, no jugular venous distention. No thyroid enlargement, no tenderness.  LUNGS: Normal breath sounds bilaterally, no wheezing, rales,rhonchi or crepitation. No use of accessory muscles of respiration.  CARDIOVASCULAR: RRR, S1, S2 normal. No murmurs, rubs, or gallops.   ABDOMEN: Soft, nontender, nondistended. Bowel sounds present. No organomegaly or mass.  EXTREMITIES: No pedal edema, cyanosis, or clubbing.  NEUROLOGIC: Cranial nerves II through XII are intact. Muscle strength 5/5 in all extremities. Sensation intact. Gait not checked.  PSYCHIATRIC: The patient is alert and oriented x 3.  SKIN:  No obvious rash, lesion, or ulcer.   LABORATORY PANEL:   CBC Recent Labs  Lab 02/14/18 1106  WBC 8.5  HGB 12.3  HCT 40.0  PLT 187   ------------------------------------------------------------------------------------------------------------------  Chemistries  Recent Labs  Lab 02/14/18 1106  NA 140  K 4.0  CL 94*  CO2 37*  GLUCOSE 129*  BUN 25*  CREATININE 1.22*  CALCIUM 9.1  AST 21  ALT 15  ALKPHOS 50  BILITOT 0.6   ------------------------------------------------------------------------------------------------------------------  Cardiac Enzymes No results for input(s): TROPONINI in the last 168 hours. ------------------------------------------------------------------------------------------------------------------  RADIOLOGY:  No results found.    IMPRESSION AND PLAN:   Altered mental status- likely due to UTI.  AMS has resolved on my exam.  Patient is alert and oriented x3, and answering questions appropriately. -Monitor closely -Can consider additional work-up if altered mental status returns  UTI- UA consistent with infection.  Not meeting sepsis criteria on admission. -Continue ceftriaxone -Urine culture pending  Hypertension- BPs elevated in the ED -Continue home Coreg -Hydralazine prn  Type 2 diabetes- hyperglycemic in the ED -Lantus 5 units qhs and moderate SSI  COPD- stable, no signs of acute exacerbation -Continue home inhalers  Hyperlipidemia- stable -Continue home lipitor  Chronic diastolic congestive heart failure- stable no signs of volume overload -Continue home Lasix  CAD- stable, no active chest  pain -Continue home aspirin and Coreg  Hypothyroidism- stable -Continue home Synthroid  Schizoaffective disorder- stable -Continue home psych meds   All the records are reviewed and case discussed with ED provider. Management plans discussed with the patient, family and they are in agreement.  CODE STATUS: Full  TOTAL TIME TAKING CARE OF THIS PATIENT: 45 minutes.    Berna Spare Crissie Aloi M.D on 02/14/2018 at 2:49 PM  Between 7am to 6pm - Pager - [phone]  After 6pm go to [email or web address] - Proofreader  [facility] [place] [facility]  Office  [phone]  CC: Primary care physician; Patient, No Pcp Per   Note: This dictation was prepared with Dragon dictation along with smaller phrase technology. Any transcriptional errors that result from this process are unintentional.

## 2018-02-14 NOTE — ED Provider Notes (Signed)
[facility] Emergency Department Provider Note  Time seen: 10:53 AM  I have reviewed the triage vital signs and the nursing notes.   HISTORY  Chief Complaint Urinary Tract Infection    HPI [name] [name] is a 72 y.o. female with a past medical history of anxiety, CAD, CHF, CKD, COPD, diabetes, gastric reflux, hypertension, hyperlipidemia, obesity, schizophrenia, presents to the emergency department with complaints of increased confusion and difficulty walking.  According to EMS report over the past 4 days family has noticed increased confusion as well as foul-smelling urine.  They also state the patient has not been walking.  Here the patient is awake and alert, able to tell me her name, where she is and the year of 2020.  Unable to provide much of a history as to why she is here however.  States she lives with her son who called for EMS.   Past Medical History:  Diagnosis Date  . Anxiety   . Arthritis   . Bronchitis   . Bursitis   . CAD (coronary artery disease)   . CHF (congestive heart failure) ([place])   . Chronic cough   . Chronic kidney disease    shadow on x-ray  . COPD (chronic obstructive pulmonary disease) ([place])   . Depression   . DM2 (diabetes mellitus, type 2) ([place])   . Environmental allergies   . GERD (gastroesophageal reflux disease)   . Hypercholesteremia   . Hypertension   . Left ventricular outflow tract obstruction    a. echo 03/2014: EF 60-65%, hypernamic LV systolic fxn, mod LVH w/ LVOT gradient estimated at 68 mm Hg w/ valsalva, very small LV internal cavity size, mildly increased LV posterior wall thickness, mild Ao valve scl w/o stenosis, diastolic dysfunction, normal RVSP  . Lower extremity edema   . LVH (left ventricular hypertrophy)    a. echo suggests long standing uncontrolled htn. she will not do well when dehydrated, LV cavity obliteration  . Muscle weakness   . Obesity   . On supplemental oxygen therapy    AS NEEDED  . OSA  (obstructive sleep apnea)    does not use machine  . Osteoarthritis   . Schizophrenia ([place])   . Tremors of nervous system   . Wheezing     Patient Active Problem List   Diagnosis Date Noted  . Pressure injury of skin 11/08/2017  . Acute and chronic respiratory failure with hypercapnia ([place]) 11/05/2017  . Acute renal failure (ARF) ([place]) 08/05/2017  . ARF (acute renal failure) ([place]) 08/03/2017  . UTI (urinary tract infection) 01/16/2016  . Altered mental status 01/16/2016  . Metabolic encephalopathy   . Acute on chronic respiratory failure with hypoxia and hypercapnia ([place]) 10/13/2015  . Involuntary commitment 09/02/2015  . Schizoaffective disorder, bipolar type ([place])   . Urinary incontinence 10/30/2014  . GERD (gastroesophageal reflux disease) 10/30/2014  . OSA (obstructive sleep apnea) 10/30/2014  . Osteoarthrosis, unspecified whether generalized or localized, involving lower leg 10/30/2014  . COPD (chronic obstructive pulmonary disease) ([place]) 10/30/2014  . Chronic diastolic CHF (congestive heart failure) ([place]) 05/15/2014  . Morbid obesity ([place])   . History of colon polyps 03/09/2013  . Renal mass 06/26/2011  . Lytic bone lesion of hip 06/25/2011  . Hypertension 06/24/2011  . Diabetes mellitus ([place]) 06/24/2011    Past Surgical History:  Procedure Laterality Date  . CATARACT EXTRACTION W/PHACO Left 09/10/2014   Procedure: CATARACT EXTRACTION PHACO AND INTRAOCULAR LENS PLACEMENT (IOC);  Surgeon: Birder Robson, MD;  Location: [facility] ORS;  Service: Ophthalmology;  Laterality: Left;  [place]: 00:44 AP%: 22.8 CDE: 10.24 Fluid lot #[id number]  . CATARACT EXTRACTION W/PHACO Right 10/15/2014   Procedure: CATARACT EXTRACTION PHACO AND INTRAOCULAR LENS PLACEMENT (IOC);  Surgeon: Birder Robson, MD;  Location: [facility] ORS;  Service: Ophthalmology;  Laterality: Right;  [place]: 00:52 AP:40.1 CDE:11.83 LOT PACK #[id number]  . CHOLECYSTECTOMY    . COLONOSCOPY  04/2011   [facility] per patient incomplete  .  EYE SURGERY    . JOINT REPLACEMENT     TKR  . KNEE RECONSTRUCTION, MEDIAL PATELLAR FEMORAL LIGAMENT    . RENAL BIOPSY    . TONSILLECTOMY    . TONSILLECTOMY      Prior to Admission medications   Medication Sig Start Date End Date Taking? Authorizing Provider  acetaminophen (TYLENOL) 325 MG tablet Take 2 tablets (650 mg total) by mouth every 4 (four) hours as needed for mild pain (temp > 101.5). 10/20/15   Gladstone Lighter, MD  albuterol (PROVENTIL HFA;VENTOLIN HFA) 108 (90 Base) MCG/ACT inhaler Inhale 2 puffs into the lungs every 6 (six) hours as needed for wheezing or shortness of breath.    [provider]  Amino Acids-Protein Hydrolys (FEEDING SUPPLEMENT, PRO-STAT SUGAR FREE 64,) LIQD Take 30 mLs by mouth 2 (two) times daily.    [provider]  ampicillin (PRINCIPEN) 500 MG capsule Take 1 capsule (500 mg total) by mouth every 8 (eight) hours. 11/08/17   Salary, Holly Bodily D, MD  aspirin EC 81 MG tablet Take 81 mg by mouth daily.    [provider]  atorvastatin (LIPITOR) 20 MG tablet Take 20 mg by mouth at bedtime.    [provider]  benztropine (COGENTIN) 0.5 MG tablet Take 0.5 mg by mouth 2 (two) times daily.    [provider]  carvedilol (COREG) 12.5 MG tablet Take 1 tablet (12.5 mg total) by mouth 2 (two) times daily. 11/08/17   Salary, [name], MD  cefdinir (OMNICEF) 300 MG capsule Take 1 capsule (300 mg total) by mouth every 12 (twelve) hours. 11/08/17   Salary, [name], MD  cetirizine (ZYRTEC) 10 MG tablet Take 10 mg by mouth daily.    [provider]  cholecalciferol (VITAMIN D) 1000 units tablet Take 1,000 Units by mouth daily.    [provider]  divalproex (DEPAKOTE) 500 MG DR tablet Take 1 tablet (500 mg total) by mouth 3 (three) times daily. 08/15/17   Loletha Grayer, MD  docusate sodium (COLACE) 100 MG capsule Take 200 mg by mouth daily.    [provider]  fluPHENAZine (PROLIXIN) 5 MG/ML solution Take  5 mg by mouth 3 (three) times daily.    [provider]  fluPHENAZine decanoate (PROLIXIN) 25 MG/ML injection Inject 2 mLs (50 mg total) into the muscle every 14 (fourteen) days. 08/25/17   Loletha Grayer, MD  Fluticasone-Salmeterol (ADVAIR) 250-50 MCG/DOSE AEPB Inhale 1 puff into the lungs 2 (two) times daily.    [provider]  furosemide (LASIX) 20 MG tablet Take 1 tablet (20 mg total) by mouth daily. 08/15/17   Loletha Grayer, MD  glipiZIDE (GLUCOTROL) 10 MG tablet Take 1 tablet by mouth daily. 04/06/17   [provider]  guaiFENesin (ROBITUSSIN) 100 MG/5ML SOLN Take 15 mLs by mouth every 6 (six) hours as needed for cough or to loosen phlegm. *Notify physician if cough persists more than 3 days*    [provider]  insulin aspart (NOVOLOG) 100 UNIT/ML injection Inject 4 Units into  the skin 3 (three) times daily before meals. 08/15/17   Loletha Grayer, MD  insulin glargine (LANTUS) 100 UNIT/ML injection Inject 11 Units into the skin at bedtime.     [provider]  levothyroxine (SYNTHROID, LEVOTHROID) 75 MCG tablet Take 1 tablet by mouth daily. 04/05/17   [provider]  magnesium hydroxide (MILK OF MAGNESIA) 400 MG/5ML suspension Take 30 mLs by mouth daily as needed for mild constipation.    [provider]  Multiple Vitamin (MULTIVITAMIN WITH MINERALS) TABS tablet Take 1 tablet by mouth daily.    [provider]  nitroGLYCERIN (NITROSTAT) 0.4 MG SL tablet Place 0.4 mg under the tongue every 5 (five) minutes as needed for chest pain.    [provider]  OLANZapine zydis (ZYPREXA) 5 MG disintegrating tablet Take 1 tablet (5 mg total) by mouth daily as needed (physical agitation). 11/08/17   Salary, [name], MD  polyethylene glycol (MIRALAX / GLYCOLAX) packet Take 17 g by mouth daily as needed for mild constipation. 08/15/17   Loletha Grayer, MD  ranitidine (ZANTAC) 150 MG tablet Take 150 mg by mouth 2 (two) times  daily.    [provider]  Skin Protectants, Misc. (EUCERIN) cream Apply 1 application topically daily.    [provider]  tiotropium (SPIRIVA) 18 MCG inhalation capsule Place 1 capsule (18 mcg total) into inhaler and inhale daily. 07/02/14   [name], MD    Allergies  Allergen Reactions  . Haldol [Haloperidol Decanoate] Swelling and Other (See Comments)    Reaction:  Swelling of tongue and blurred vision    . Metformin Diarrhea  . Prednisone Other (See Comments)    Unknown reaction  . Raspberry Swelling and Other (See Comments)    Reaction:  Swelling of lips     Family History  Problem Relation Age of Onset  . Heart attack Mother   . Colon cancer Neg Hx   . Liver disease Neg Hx     Social History Social History   Tobacco Use  . Smoking status: Former Smoker    Packs/day: 3.00    Years: 40.00    Pack years: 120.00  . Smokeless tobacco: Never Used  . Tobacco comment: quit in 2009  Substance Use Topics  . Alcohol use: No    Comment: occ.  . Drug use: No    Review of Systems Constitutional: Negative for fever. Cardiovascular: Negative for chest pain. Respiratory: Negative for shortness of breath. Gastrointestinal: Negative for abdominal pain Genitourinary: Negative for urinary compaints Musculoskeletal: Negative for musculoskeletal complaints Skin: Negative for skin complaints  Neurological: Negative for headache All other ROS negative, although possibly limited by mild confusion  ____________________________________________   PHYSICAL EXAM:  VITAL SIGNS: ED Triage Vitals  Enc Vitals Group     BP 02/14/18 1034 (!) 155/72     Pulse Rate 02/14/18 1034 63     Resp 02/14/18 1034 18     Temp 02/14/18 1034 98.5 F (36.9 C)     Temp Source 02/14/18 1034 Oral     SpO2 02/14/18 1034 94 %     Weight 02/14/18 1031 273 lb 2.4 oz (123.9 kg)     Height 02/14/18 1031 5\' 8"  (1.727 m)     Head Circumference --      Peak Flow --      Pain  Score 02/14/18 1031 0     Pain Loc --      Pain Edu? --  Excl. in [place]? --     Constitutional: Alert, oriented to person place states the year of 2020 instead of 2019.  No distress.  Denies any complaints. Eyes: Normal exam ENT   Head: Normocephalic and atraumatic.   Mouth/Throat: Mucous membranes are moist. Cardiovascular: Normal rate, regular rhythm.  Respiratory: Normal respiratory effort without tachypnea nor retractions. Breath sounds are clear  Gastrointestinal: Soft and nontender. No distention.  Musculoskeletal: Nontender with normal range of motion in all extremities.  Neurologic:  Normal speech and language. No gross focal neurologic deficits Skin:  Skin is warm, dry and intact.  Psychiatric: Mood and affect are normal.   ____________________________________________    EKG  EKG viewed and interpreted by myself shows a normal sinus rhythm at 59 bpm with a narrow QRS, normal axis, largely normal intervals with nonspecific ST changes.  ____________________________________________    INITIAL IMPRESSION / ASSESSMENT AND PLAN / ED COURSE  Pertinent labs & imaging results that were available during my care of the patient were reviewed by me and considered in my medical decision making (see chart for details).  Patient presents to the emergency department with concerns of foul-smelling urine with increased confusion over the past 3 to 4 days.  Currently the patient appears well, no complaints besides feeling too weak to walk.  Awaiting family arrival for further history.  Patient's labs have resulted largely at her baseline besides a mild urinary tract infection.  We will dose IV Rocephin and send a urine culture.  We will discuss with family once they arrive for further details.  I discussed the patient with her son.  Son states the patient has a home health nurse who comes to visit, states the patient was extremely confused today unable to ambulate even with  significant assistance.  They were very concerned over their ability to care for the patient at home and recommended she come to the hospital for admission for presumed urinary tract infection.  Patient does not fact have a urinary tract infection with too numerous to count white blood cells nitrite positive with bacteria.  We will start on IV Rocephin, send urine culture.  Given the patient's increased weakness inability to safely ambulate at home even with assistance and urinary tract infection we will admit to the hospitalist service for further treatment.  Son agreeable to plan of care  ____________________________________________   FINAL CLINICAL IMPRESSION(S) / ED DIAGNOSES  Urinary tract infection Weakness   Harvest Dark, MD 02/14/18 1406

## 2018-02-14 NOTE — ED Notes (Signed)
PT taken off the bedpan by this RN and Rush Landmark, Therapist, sports. Pt placed in clean gown at this time. Pt given another meal tray at this time.

## 2018-02-14 NOTE — ED Notes (Signed)
Pt placed on bedpan by Rush Landmark, RN, and [employer], RN.

## 2018-02-14 NOTE — ED Notes (Signed)
Pt given meal tray at this time 

## 2018-02-15 DIAGNOSIS — N390 Urinary tract infection, site not specified: Secondary | ICD-10-CM | POA: Diagnosis not present

## 2018-02-15 LAB — CBC
HEMATOCRIT: 39.1 % (ref 36.0–46.0)
Hemoglobin: 12.1 g/dL (ref 12.0–15.0)
MCH: 29.2 pg (ref 26.0–34.0)
MCHC: 30.9 g/dL (ref 30.0–36.0)
MCV: 94.4 fL (ref 80.0–100.0)
Platelets: 177 10*3/uL (ref 150–400)
RBC: 4.14 MIL/uL (ref 3.87–5.11)
RDW: 13.8 % (ref 11.5–15.5)
WBC: 8.9 10*3/uL (ref 4.0–10.5)
nRBC: 0 % (ref 0.0–0.2)

## 2018-02-15 LAB — GLUCOSE, CAPILLARY
Glucose-Capillary: 199 mg/dL — ABNORMAL HIGH (ref 70–99)
Glucose-Capillary: 235 mg/dL — ABNORMAL HIGH (ref 70–99)
Glucose-Capillary: 149 mg/dL — ABNORMAL HIGH (ref 70–99)
Glucose-Capillary: 166 mg/dL — ABNORMAL HIGH (ref 70–99)

## 2018-02-15 LAB — BASIC METABOLIC PANEL
Anion gap: 8 (ref 5–15)
BUN: 25 mg/dL — ABNORMAL HIGH (ref 8–23)
CHLORIDE: 95 mmol/L — AB (ref 98–111)
CO2: 37 mmol/L — ABNORMAL HIGH (ref 22–32)
Calcium: 9 mg/dL (ref 8.9–10.3)
Creatinine, Ser: 1.1 mg/dL — ABNORMAL HIGH (ref 0.44–1.00)
GFR calc Af Amer: 58 mL/min — ABNORMAL LOW (ref >60)
GFR calc non Af Amer: 50 mL/min — ABNORMAL LOW (ref >60)
GLUCOSE: 132 mg/dL — AB (ref 70–99)
Potassium: 4 mmol/L (ref 3.5–5.1)
Sodium: 140 mmol/L (ref 135–145)

## 2018-02-15 MED ORDER — INFLUENZA VAC SPLIT HIGH-DOSE 0.5 ML IM SUSY
0.5000 mL | PREFILLED_SYRINGE | INTRAMUSCULAR | Status: AC
  Administered 2018-02-16: 12:00:00 0.5 mL via INTRAMUSCULAR
  Filled 2018-02-15 (×2): qty 0.5

## 2018-02-15 MED ORDER — PNEUMOCOCCAL VAC POLYVALENT 25 MCG/0.5ML IJ INJ
0.5000 mL | INJECTION | INTRAMUSCULAR | Status: AC
  Administered 2018-02-16: 0.5 mL via INTRAMUSCULAR
  Filled 2018-02-15: qty 0.5

## 2018-02-15 MED ORDER — SODIUM CHLORIDE 0.9% FLUSH
3.0000 mL | Freq: Two times a day (BID) | INTRAVENOUS | Status: DC
  Administered 2018-02-15 – 2018-02-16 (×4): 3 mL via INTRAVENOUS
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
## 2018-02-15 NOTE — Progress Notes (Signed)
Alert, pleasant/forgetful. Denies co's. Afebrile. IV antibiotic continued.

## 2018-02-15 NOTE — Care Management Note (Signed)
Case Management Note  Patient Details  Name: [name] [name] MRN: [id number] Date of Birth: 1945-11-05  Subjective/Objective:                  RNCM met with patient to discuss MOON letter and discharge planning. She asked that I contact her son [name] [phone] which I did.  MOON letter explained and copy left at bedside.  She is currently followed by [facility] home health and recently left [facility] (she does not want to go back to SNF).  She is primarily wheelchair bound as she has not progressed per [employer].  She is on chronic O2 at home but doesn't recall agency name- she has a portable tank. She typically uses wheelchair [facility] "out of [place]" for transportation so she may need EMS to home (?[employer]).  He is interested in [facility] services but it looks to be unavailable to her (may be because no current PCP).  He states she has Medicaid for long-term care in a facility but not for home and requests more information.  Action/Plan: CSW updated. Malachy Mood with [facility] home health updated of presentation- please add PT/OT/CNA/SW to home orders.  Referral to [facility] for review.  Expected Discharge Date:                  Expected Discharge Plan:     In-House Referral:     Discharge planning Services  CM Consult  Post Acute Care Choice:  Home Health Choice offered to:  Patient, Adult Children  DME Arranged:    DME Agency:     HH Arranged:  PT, OT, Nurse's Aide, Social Work [employer] Agency:  [employer]  Status of Service:  In process, will continue to follow  If discussed at Long Length of Stay Meetings, dates discussed:    Additional Comments:  Marshell Garfinkel, RN 02/15/2018, 9:39 AM

## 2018-02-15 NOTE — Progress Notes (Signed)
[place] [facility] [place] NAME: [name] [name]    MR#:  [id number]  DATE OF BIRTH:  1945-03-30  SUBJECTIVE:  CHIEF COMPLAINT:   Chief Complaint  Patient presents with  . Urinary Tract Infection   Came with altered mental status and weakness.  Found to have UTI.  Feels better today.  Cultures are still pending. REVIEW OF SYSTEMS:  CONSTITUTIONAL: No fever, have fatigue or weakness.  EYES: No blurred or double vision.  EARS, NOSE, AND THROAT: No tinnitus or ear pain.  RESPIRATORY: No cough, shortness of breath, wheezing or hemoptysis.  CARDIOVASCULAR: No chest pain, orthopnea, edema.  GASTROINTESTINAL: No nausea, vomiting, diarrhea or abdominal pain.  GENITOURINARY: No dysuria, hematuria.  ENDOCRINE: No polyuria, nocturia,  HEMATOLOGY: No anemia, easy bruising or bleeding SKIN: No rash or lesion. MUSCULOSKELETAL: No joint pain or arthritis.   NEUROLOGIC: No tingling, numbness, weakness.  PSYCHIATRY: No anxiety or depression.   ROS  DRUG ALLERGIES:   Allergies  Allergen Reactions  . Haldol [Haloperidol Decanoate] Swelling and Other (See Comments)    Reaction:  Swelling of tongue and blurred vision    . Metformin Diarrhea  . Prednisone Other (See Comments)    Unknown reaction  . Raspberry Swelling and Other (See Comments)    Reaction:  Swelling of lips     VITALS:  Blood pressure (!) 145/82, pulse (!) 58, temperature (!) 97.3 F (36.3 C), temperature source Oral, resp. rate 20, height 5\' 8"  (1.727 m), weight 123.9 kg, SpO2 99 %.  PHYSICAL EXAMINATION:  GENERAL:  72 y.o.-year-old patient lying in the bed with no acute distress.  EYES: Pupils equal, round, reactive to light and accommodation. No scleral icterus. Extraocular muscles intact.  HEENT: Head atraumatic, normocephalic. Oropharynx and nasopharynx clear.  Moist mucous membranes. NECK:  Supple, no jugular venous distention. No thyroid enlargement, no tenderness.  LUNGS: Normal  breath sounds bilaterally, no wheezing, rales,rhonchi or crepitation. No use of accessory muscles of respiration.  CARDIOVASCULAR: RRR, S1, S2 normal. No murmurs, rubs, or gallops.  ABDOMEN: Soft, nontender, nondistended. Bowel sounds present. No organomegaly or mass.  EXTREMITIES: No pedal edema, cyanosis, or clubbing.  NEUROLOGIC: Cranial nerves II through XII are intact. Muscle strength 3-4/5 in all extremities. Sensation intact. Gait not checked.  PSYCHIATRIC: The patient is alert and oriented x 3.  SKIN: No obvious rash, lesion, or ulcer.   Physical Exam LABORATORY PANEL:   CBC Recent Labs  Lab 02/15/18 0335  WBC 8.9  HGB 12.1  HCT 39.1  PLT 177   ------------------------------------------------------------------------------------------------------------------  Chemistries  Recent Labs  Lab 02/14/18 1106  02/15/18 0335  NA 140  --  140  K 4.0  --  4.0  CL 94*  --  95*  CO2 37*  --  37*  GLUCOSE 129*  --  132*  BUN 25*  --  25*  CREATININE 1.22*   < > 1.10*  CALCIUM 9.1  --  9.0  AST 21  --   --   ALT 15  --   --   ALKPHOS 50  --   --   BILITOT 0.6  --   --    < > = values in this interval not displayed.   ------------------------------------------------------------------------------------------------------------------  Cardiac Enzymes Recent Labs  Lab 02/14/18 1704  TROPONINI <0.03   ------------------------------------------------------------------------------------------------------------------  RADIOLOGY:  No results found.  ASSESSMENT AND PLAN:   Active Problems:   AMS (altered mental status)  Altered mental status- likely  due to UTI.  AMS has resolved on my exam.  Patient is alert and oriented x3, and answering questions appropriately. -Monitor closely  UTI- UA consistent with infection.  Not meeting sepsis criteria on admission. -Continue ceftriaxone -Urine culture pending-E. coli but sensitivities pending. -She had recurrent infections in  the past and also noted to have a renal mass, follows with urology at [facility].  Renal mass -She follows with hematology and urology at [facility], advised to continue the same.  Hypertension- BPs elevated in the ED -Continue home Coreg -Hydralazine prn  Type 2 diabetes- hyperglycemic in the ED -Lantus 5 units qhs and moderate SSI  COPD- stable, no signs of acute exacerbation -Continue home inhalers  Hyperlipidemia- stable -Continue home lipitor  Chronic diastolic congestive heart failure- stable no signs of volume overload -Continue home Lasix  CAD- stable, no active chest pain -Continue home aspirin and Coreg  Hypothyroidism- stable -Continue home Synthroid  Schizoaffective disorder- stable -Continue home psych meds    All the records are reviewed and case discussed with Care Management/Social Workerr. Management plans discussed with the patient, family and they are in agreement.  CODE STATUS: Full.  TOTAL TIME TAKING CARE OF THIS PATIENT: 35 minutes.     POSSIBLE D/C IN 1-2 DAYS, DEPENDING ON CLINICAL CONDITION.   Vaughan Basta M.D on 02/15/2018   Between 7am to 6pm - Pager - [phone]  After 6pm go to [email or web address] - password [email or web address] [facility]  Office  [phone]  CC: Primary care physician; Patient, No Pcp Per  Note: This dictation was prepared with Dragon dictation along with smaller phrase technology. Any transcriptional errors that result from this process are unintentional.

## 2018-02-15 NOTE — Clinical Social Work Note (Signed)
Clinical Social Work Assessment  Patient Details  Name: [name] [name] MRN: [id number] Date of Birth: 04/19/1945  Date of referral:  02/15/18               Reason for consult:  [employer]                Permission sought to share information with:    Permission granted to share information::     Name::        Agency::     Relationship::     Contact Information:     Housing/Transportation Living arrangements for the past 2 months:  Single Family Home Source of Information:  Adult Children Patient Interpreter Needed:  None Criminal Activity/Legal Involvement Pertinent to Current Situation/Hospitalization:  No - Comment as needed Significant Relationships:  Adult Children Lives with:  Adult Children Do you feel safe going back to the place where you live?  Yes Need for family participation in patient care:  Yes (Comment)  Care giving concerns:  Patient lives in [place] with her son [name].    Social Worker assessment / plan:  Holiday representative (CSW) received verbal consult from RN case manager that patient's son [name] wants information on personal care services in the home. CSW contacted patient's son [name] to provide resources. Per son patient was in a SNF in [place] however he decided to bring patient home to his house. Patient now lives with him in [place]. Son does not want SNF placement for patient. Per son patient does have medicaid. Son inquired about personal care services [facility]). CSW explained that he would have to apply for PCS services through [place] and made him aware that there is a long waiting list. Son verbalized his understanding and reported no other needs or concerns. CSW will continue to follow and assist as needed.   Employment status:  Disabled (Comment on whether or not currently receiving Disability), Retired Forensic scientist:  Medicaid In [place], [employer] PT Recommendations:  Not assessed at this time Information / Referral to  community resources:  Other (Comment Required)([place] resources )  Patient/Family's Response to care: [name] prefers to take patient home.   Patient/Family's Understanding of and Emotional Response to Diagnosis, Current Treatment, and Prognosis: [name] was very pleasant and thanked CSW for assistance.   Emotional Assessment Appearance:    Attitude/Demeanor/Rapport:    Affect (typically observed):    Orientation:  Oriented to Self, Oriented to Place, Fluctuating Orientation (Suspected and/or reported Sundowners) Alcohol / Substance use:  Not Applicable Psych involvement (Current and /or in the community):  No (Comment)  Discharge Needs  Concerns to be addressed:  No discharge needs identified Readmission within the last 30 days:  No Current discharge risk:  Chronically ill Barriers to Discharge:  Continued Medical Work up   [employer], Veronia Beets, LCSW 02/15/2018, 2:13 PM

## 2018-02-15 NOTE — Care Management Obs Status (Signed)
MEDICARE OBSERVATION STATUS NOTIFICATION   Patient Details  Name: [name] [name] MRN: [id number] Date of Birth: [birth year]   Medicare Observation Status Notification Given:  Yes    Marshell Garfinkel, RN 02/15/2018, 7:34 AM

## 2018-02-16 DIAGNOSIS — N390 Urinary tract infection, site not specified: Secondary | ICD-10-CM | POA: Diagnosis not present

## 2018-02-16 LAB — GLUCOSE, CAPILLARY
Glucose-Capillary: 126 mg/dL — ABNORMAL HIGH (ref 70–99)
Glucose-Capillary: 178 mg/dL — ABNORMAL HIGH (ref 70–99)

## 2018-02-16 LAB — URINE CULTURE: Culture: >=100000 — AB

## 2018-02-16 MED ORDER — FUROSEMIDE 20 MG PO TABS: 80.0000 mg | ORAL_TABLET | Freq: Every day | ORAL | 0 refills | Status: DC

## 2018-02-16 MED ORDER — INSULIN ASPART 100 UNIT/ML ~~LOC~~ SOLN: 4.0000 [IU] | Freq: Four times a day (QID) | SUBCUTANEOUS | 0 refills | Status: DC

## 2018-02-16 MED ORDER — CEPHALEXIN 500 MG PO CAPS: 500.0000 mg | ORAL_CAPSULE | Freq: Three times a day (TID) | ORAL | 0 refills | Status: AC

## 2018-02-16 NOTE — Evaluation (Signed)
Physical Therapy Evaluation Patient Details Name: [name] [name] MRN: [id number] DOB: 10-Mar-1945 Today's Date: 02/16/2018   History of Present Illness  72 y.o. female presenting to [facility] with SOB. PMH includes DM, HTN, HLD, tremors, schizophrenia, anxiety, arthritis, bronchitis, CAD, CHF, chronic cough, COPD, GERD, and left ventricular hypertrophy, admitted for AMS, weakness.   Clinical Impression  Patient alert, oriented to self, situation, place, did state that she thought it was the day after Christmas of 2026. Pt reported that she lives with her son who is available intermittently in 2 story house. Does endorse that she has an aide, that "helps her out", but unable to provide information about how long/how often the aide comes. Stated she needs assistance for ADLs and mobility, last time she walked was 2 wks ago per pt.   Pt on 2L O2 via [place] throughout session. The patient was able to mobilize to EOB with minAx1 with mod verbal cues as well. Able to sit EOB with UE and LE support, CGA for safety. x2 sit<>stand this session, first attempt pt unable to maintain standing balance, second attempt improved, though modAx1 needed to achieve transfer. Pt ambulated a few ft during session with minA-CGA and RW, extensive verbal cues for posture, physical assistance needed for AD management. Pt SOB but vitals stable at end of mobility. Overall the patient demonstrated limitations (see "PT Problem List") that impede the patients ability to perform functional activities, safety, and mobility. The patient would benefit from skilled PT intervention to address these.      Follow Up Recommendations SNF    Equipment Recommendations  None recommended by PT(Pt reported that she has a RW and WC at home)    Recommendations for Other Services       Precautions / Restrictions Precautions Precautions: Fall Restrictions Weight Bearing Restrictions: No      Mobility  Bed Mobility Overal bed mobility: Needs  Assistance Bed Mobility: Supine to Sit     Supine to sit: Min assist        Transfers Overall transfer level: Needs assistance Equipment used: Rolling walker (2 wheeled) Transfers: Sit to/from Stand Sit to Stand: Mod assist;From elevated surface         General transfer comment: x2. Second attempt pt able to maintain standing balance.  Ambulation/Gait Ambulation/Gait assistance: Min assist;Min guard Gait Distance (Feet): 4 Feet Assistive device: Rolling walker (2 wheeled)       General Gait Details: shuffling gait, decreased velocity, poor step length L>R, cues for AD management, upright posture  Stairs            Wheelchair Mobility    Modified Rankin (Stroke Patients Only)       Balance Overall balance assessment: Needs assistance Sitting-balance support: Feet supported;Single extremity supported Sitting balance-Leahy Scale: Poor       Standing balance-Leahy Scale: Poor                               Pertinent Vitals/Pain Pain Assessment: No/denies pain    Home Living Family/patient expects to be discharged to:: Private residence Living Arrangements: Children Available Help at Discharge: Family;Personal care attendant Type of Home: House Home Access: Stairs to enter Entrance Stairs-Rails: Right Entrance Stairs-Number of Steps: 3-4 Home Layout: Two level;Able to live on main level with bedroom/bathroom Home Equipment: Gilford Rile - 2 wheels;Wheelchair - manual      Prior Function Level of Independence: Needs assistance   Gait / Transfers Assistance  Needed: physical assist needed, reported she has not walked in about 2 wks, uses WC at home predominantly recently  ADL's / Homemaking Assistance Needed: son and aid provide care, needs assistance with ADLs, dependent for IADLs        Hand Dominance        Extremity/Trunk Assessment   Upper Extremity Assessment Upper Extremity Assessment: Generalized weakness    Lower Extremity  Assessment Lower Extremity Assessment: Generalized weakness       Communication   Communication: No difficulties  Cognition Arousal/Alertness: Awake/alert Behavior During Therapy: WFL for tasks assessed/performed Overall Cognitive Status: Within Functional Limits for tasks assessed                                 General Comments: Pt A&Ox3, though thought it was 2026.      General Comments      Exercises     Assessment/Plan    PT Assessment Patient needs continued PT services  PT Problem List Decreased strength;Decreased activity tolerance;Decreased knowledge of use of DME;Decreased balance;Decreased safety awareness;Decreased mobility;Obesity       PT Treatment Interventions DME instruction;Therapeutic exercise;Gait training;Balance training;Stair training;Neuromuscular re-education;Functional mobility training;Therapeutic activities;Patient/family education    PT Goals (Current goals can be found in the Care Plan section)  Acute Rehab PT Goals Patient Stated Goal: to walk PT Goal Formulation: With patient Time For Goal Achievement: 03/02/18 Potential to Achieve Goals: Fair    Frequency Min 2X/week   Barriers to discharge Inaccessible home environment      Co-evaluation               AM-PAC PT "6 Clicks" Mobility  Outcome Measure Help needed turning from your back to your side while in a flat bed without using bedrails?: A Lot Help needed moving from lying on your back to sitting on the side of a flat bed without using bedrails?: A Lot Help needed moving to and from a bed to a chair (including a wheelchair)?: A Lot Help needed standing up from a chair using your arms (e.g., wheelchair or bedside chair)?: A Lot Help needed to walk in hospital room?: A Lot Help needed climbing 3-5 steps with a railing? : Total 6 Click Score: 11    End of Session Equipment Utilized During Treatment: Gait belt;Oxygen Activity Tolerance: Patient tolerated  treatment well;Patient limited by fatigue Patient left: with chair alarm set;in chair;with call bell/phone within reach Nurse Communication: Mobility status PT Visit Diagnosis: Unsteadiness on feet (R26.81);Difficulty in walking, not elsewhere classified (R26.2);Muscle weakness (generalized) (M62.81)    Time: 7096-2836 PT Time Calculation (min) (ACUTE ONLY): 28 min   Charges:   PT Evaluation $PT Eval Moderate Complexity: 1 Mod PT Treatments $Therapeutic Activity: 8-22 mins        Lieutenant Diego PT, DPT 10:07 AM,02/16/18 [phone]

## 2018-02-16 NOTE — Discharge Planning (Signed)
Patient IV removed.  RN assessment and VS revealed stability for DC to home with [facility].  Discharge papers given, explained and educated on insulin administration, safe BS parameters, when to give insulin and when to NOT.  S/sx of infection and when to contact Dr. Informed of suggested FU appt and son made appt. (1st time visit with PCP).  Script printed and given.  Once ready, will be wheeled to front with continuous home O2 (2L). Son transporting home via car.

## 2018-02-16 NOTE — Care Management Note (Signed)
Case Management Note  Patient Details  Name: [name] [name] MRN: [id number] Date of Birth: 04-13-1945  Subjective/Objective:    Patient under observation for altered mental status- discharge orders are in.  Patient is open with [facility] Sharmon Revere notified of discharge today.  Patient is sitting up in the chair awake and alert.  Patient is aware of discharge today, patient reports that she lives with her son, she reports that she is not walking well but she has a walker, a wheelchair, a bedside commode, a hospital bed and a shower chair.  Patient reports that her son will come and pick her up, he has a special Lucianne Lei just to transport her.  Doran Clay RN BSN [phone]             Action/Plan: Discharge home with home health, [facility]   Expected Discharge Date:  02/16/18               Expected Discharge Plan:  [place]  In-House Referral:     Discharge planning Services  CM Consult  Post Acute Care Choice:  Home Health Choice offered to:  Patient, Adult Children  DME Arranged:    DME Agency:     HH Arranged:  PT, OT, Nurse's Aide, Social Work [employer] Agency:  [employer]  Status of Service:  Completed, signed off  If discussed at [employer], dates discussed:    Additional Comments:  Shelbie Hutching, RN 02/16/2018, 11:09 AM

## 2018-02-16 NOTE — Discharge Summary (Signed)
[place] NAME: [name] [name]    MR#:  [id number]  DATE OF BIRTH:  [birth year]  DATE OF ADMISSION:  02/14/2018   ADMITTING PHYSICIAN: Sela Hua, MD  DATE OF DISCHARGE: 02/16/18  PRIMARY CARE PHYSICIAN: Patient, No Pcp Per   ADMISSION DIAGNOSIS:  Weakness [R53.1] Urinary tract infection without hematuria, site unspecified [N39.0] DISCHARGE DIAGNOSIS:  Active Problems:   AMS (altered mental status)  SECONDARY DIAGNOSIS:   Past Medical History:  Diagnosis Date  . Anxiety   . Arthritis   . Bronchitis   . Bursitis   . CAD (coronary artery disease)   . CHF (congestive heart failure) ([place])   . Chronic cough   . Chronic kidney disease    shadow on x-ray  . COPD (chronic obstructive pulmonary disease) ([place])   . Depression   . DM2 (diabetes mellitus, type 2) ([place])   . Environmental allergies   . GERD (gastroesophageal reflux disease)   . Hypercholesteremia   . Hypertension   . Left ventricular outflow tract obstruction    a. echo 03/2014: EF 60-65%, hypernamic LV systolic fxn, mod LVH w/ LVOT gradient estimated at 68 mm Hg w/ valsalva, very small LV internal cavity size, mildly increased LV posterior wall thickness, mild Ao valve scl w/o stenosis, diastolic dysfunction, normal RVSP  . Lower extremity edema   . LVH (left ventricular hypertrophy)    a. echo suggests long standing uncontrolled htn. she will not do well when dehydrated, LV cavity obliteration  . Muscle weakness   . Obesity   . On supplemental oxygen therapy    AS NEEDED  . OSA (obstructive sleep apnea)    does not use machine  . Osteoarthritis   . Schizophrenia ([place])   . Tremors of nervous system   . Wheezing    HOSPITAL COURSE:   [name] [name] is a 72 year old female who presented to the ED with altered mental status and worsening confusion over the last couple of days.  She also had urinary symptoms including dysuria, frequency, urgency, and  malodorous urine.  UA was consistent with infection.  She was admitted for further management.  Altered mental status-likely due to UTI. AMS completely resolved on the day of discharge.  UTI-UA consistent with infection. Did not meet sepsis criteria this admission. -Urine culture grew >100,000 CFU E. Coli -Initially treated with ceftriaxone and then transitioned to Keflex on discharge for a total 7-day course -Needs to follow-up with [facility] urology on discharge  Right renal mass- followed by oncology and urology at [facility].  Hypertension-stable -Continued home Coreg  Type 2 diabetes-stable -Treated with Lantus 5 unitsqhsand moderate SSI  COPD-stable, no signs of acute exacerbation -Continued home inhalers  Hyperlipidemia- stable -Continued home lipitor  Chronic diastolic congestive heart failure- stable,no signs of volume overload -Continued home Lasix  CAD-stable, no active chest pain -Continued home aspirin and Coreg  Hypothyroidism- stable -Continued home Synthroid  Schizoaffective disorder-stable -Continued home psych meds  Evaluated by PT this admission, recommended discharge to skilled nursing facility.  After discussion with son, he would prefer to take her home with home health PT.  Home health services ordered on discharge.  DISCHARGE CONDITIONS:  UTI Right renal mass Hypertension Type 2 diabetes COPD Hyperlipidemia Chronic diastolic heart failure CAD Hypothyroidism Schizoaffective disorder CONSULTS OBTAINED:  None DRUG ALLERGIES:   Allergies  Allergen Reactions  . Haldol [Haloperidol Decanoate] Swelling and Other (See Comments)    Reaction:  Swelling of tongue  and blurred vision    . Prednisone Other (See Comments)    Unknown reaction  . Raspberry Swelling and Other (See Comments)    Reaction:  Swelling of lips   . Metformin Diarrhea   DISCHARGE MEDICATIONS:   Allergies as of 02/16/2018      Reactions   Haldol [haloperidol  Decanoate] Swelling, Other (See Comments)   Reaction:  Swelling of tongue and blurred vision     Prednisone Other (See Comments)   Unknown reaction   Raspberry Swelling, Other (See Comments)   Reaction:  Swelling of lips    Metformin Diarrhea      Medication List    STOP taking these medications   OLANZapine zydis 5 MG disintegrating tablet Commonly known as:  ZYPREXA   polyethylene glycol packet Commonly known as:  MIRALAX / GLYCOLAX     TAKE these medications   acetaminophen 325 MG tablet Commonly known as:  TYLENOL Take 2 tablets (650 mg total) by mouth every 4 (four) hours as needed for mild pain (temp > 101.5).   albuterol 108 (90 Base) MCG/ACT inhaler Commonly known as:  PROVENTIL HFA;VENTOLIN HFA Inhale 2 puffs into the lungs every 6 (six) hours as needed for wheezing or shortness of breath.   aspirin EC 81 MG tablet Take 81 mg by mouth daily.   atorvastatin 20 MG tablet Commonly known as:  LIPITOR Take 20 mg by mouth at bedtime.   benztropine 0.5 MG tablet Commonly known as:  COGENTIN Take 0.5 mg by mouth 2 (two) times daily.   carvedilol 12.5 MG tablet Commonly known as:  COREG Take 1 tablet (12.5 mg total) by mouth 2 (two) times daily.   cephALEXin 500 MG capsule Commonly known as:  KEFLEX Take 1 capsule (500 mg total) by mouth 3 (three) times daily for 5 days.   cetirizine 10 MG tablet Commonly known as:  ZYRTEC Take 10 mg by mouth daily.   cholecalciferol 1000 units tablet Commonly known as:  VITAMIN D Take 1,000 Units by mouth daily.   divalproex 500 MG DR tablet Commonly known as:  DEPAKOTE Take 1 tablet (500 mg total) by mouth 3 (three) times daily.   docusate sodium 100 MG capsule Commonly known as:  COLACE Take 100 mg by mouth 2 (two) times daily.   famotidine 20 MG tablet Commonly known as:  PEPCID Take 20 mg by mouth 2 (two) times daily.   fluPHENAZine 5 MG/ML solution Commonly known as:  PROLIXIN Take 5 mg by mouth 2 (two) times  daily.   Fluticasone-Salmeterol 250-50 MCG/DOSE Aepb Commonly known as:  ADVAIR Inhale 1 puff into the lungs 2 (two) times daily.   furosemide 20 MG tablet Commonly known as:  LASIX Take 4 tablets (80 mg total) by mouth daily.   INCRUSE ELLIPTA 62.5 MCG/INH Aepb Generic drug:  umeclidinium bromide Inhale 1 puff into the lungs daily.   insulin aspart 100 UNIT/ML injection Commonly known as:  novoLOG Inject 4 Units into the skin 4 (four) times daily.   insulin glargine 100 UNIT/ML injection Commonly known as:  LANTUS Inject 11 Units into the skin at bedtime.   levothyroxine 75 MCG tablet Commonly known as:  SYNTHROID, LEVOTHROID Take 1 tablet by mouth daily.   losartan 25 MG tablet Commonly known as:  COZAAR Take 25 mg by mouth daily.   nitroGLYCERIN 0.4 MG SL tablet Commonly known as:  NITROSTAT Place 0.4 mg under the tongue every 5 (five) minutes as needed for chest pain.  sitaGLIPtin 50 MG tablet Commonly known as:  JANUVIA Take 50 mg by mouth daily.   SM MELATONIN 3 MG Tabs Generic drug:  Melatonin Take 3 mg by mouth at bedtime as needed (sleep).       DISCHARGE INSTRUCTIONS:  1.  Continue Keflex 3 times daily for a total 7 days 2.  Follow-up with oncology and urology on discharge DIET:  Cardiac diet and Diabetic diet DISCHARGE CONDITION:  Stable ACTIVITY:  Activity as tolerated OXYGEN:  Home Oxygen: No.  Oxygen Delivery: room air DISCHARGE LOCATION:  home   If you experience worsening of your admission symptoms, develop shortness of breath, life threatening emergency, suicidal or homicidal thoughts you must seek medical attention immediately by calling 911 or calling your MD immediately  if symptoms less severe.  You Must read complete instructions/literature along with all the possible adverse reactions/side effects for all the Medicines you take and that have been prescribed to you. Take any new Medicines after you have completely understood and  accpet all the possible adverse reactions/side effects.   Please note  You were cared for by a hospitalist during your hospital stay. If you have any questions about your discharge medications or the care you received while you were in the hospital after you are discharged, you can call the unit and asked to speak with the hospitalist on call if the hospitalist that took care of you is not available. Once you are discharged, your primary care physician will handle any further medical issues. Please note that NO REFILLS for any discharge medications will be authorized once you are discharged, as it is imperative that you return to your primary care physician (or establish a relationship with a primary care physician if you do not have one) for your aftercare needs so that they can reassess your need for medications and monitor your lab values.    On the day of Discharge:  VITAL SIGNS:  Blood pressure 138/82, pulse 60, temperature 97.8 F (36.6 C), temperature source Oral, resp. rate 20, height 5\' 8"  (1.727 m), weight 123.9 kg, SpO2 100 %. PHYSICAL EXAMINATION:  GENERAL:  72 y.o.-year-old patient lying in the bed with no acute distress.  EYES: Pupils equal, round, reactive to light and accommodation. No scleral icterus. Extraocular muscles intact.  HEENT: Head atraumatic, normocephalic. Oropharynx and nasopharynx clear.  NECK:  Supple, no jugular venous distention. No thyroid enlargement, no tenderness.  LUNGS: Normal breath sounds bilaterally, no wheezing, rales,rhonchi or crepitation. No use of accessory muscles of respiration.  CARDIOVASCULAR: RRR, S1, S2 normal. No murmurs, rubs, or gallops.  ABDOMEN: Soft, non-tender, non-distended. Bowel sounds present. No organomegaly or mass.  EXTREMITIES: No pedal edema, cyanosis, or clubbing.  NEUROLOGIC: Cranial nerves II through XII are intact. Muscle strength 5/5 in all extremities. Sensation intact. Gait not checked.  PSYCHIATRIC: The patient is  alert and oriented x 3.  SKIN: No obvious rash, lesion, or ulcer.  DATA REVIEW:   CBC Recent Labs  Lab 02/15/18 0335  WBC 8.9  HGB 12.1  HCT 39.1  PLT 177    Chemistries  Recent Labs  Lab 02/14/18 1106  02/15/18 0335  NA 140  --  140  K 4.0  --  4.0  CL 94*  --  95*  CO2 37*  --  37*  GLUCOSE 129*  --  132*  BUN 25*  --  25*  CREATININE 1.22*   < > 1.10*  CALCIUM 9.1  --  9.0  AST 21  --   --  ALT 15  --   --   ALKPHOS 50  --   --   BILITOT 0.6  --   --    < > = values in this interval not displayed.     Microbiology Results  Results for orders placed or performed during the hospital encounter of 02/14/18  Urine culture     Status: Abnormal   Collection Time: 02/14/18 11:06 AM  Result Value Ref Range Status   Specimen Description   Final    URINE, RANDOM Performed at [facility], [address] [place], [place] [address]    Special Requests   Final    NONE Performed at [facility], [address] [place], [place] [address]    Culture >=100,000 COLONIES/mL ESCHERICHIA COLI (A)  Final   Report Status 02/16/2018 FINAL  Final   Organism ID, Bacteria ESCHERICHIA COLI (A)  Final      Susceptibility   Escherichia coli - MIC*    AMPICILLIN >=32 RESISTANT Resistant     CEFAZOLIN <=4 SENSITIVE Sensitive     CEFTRIAXONE <=1 SENSITIVE Sensitive     CIPROFLOXACIN <=0.25 SENSITIVE Sensitive     GENTAMICIN <=1 SENSITIVE Sensitive     IMIPENEM <=0.25 SENSITIVE Sensitive     NITROFURANTOIN <=16 SENSITIVE Sensitive     TRIMETH/SULFA >=320 RESISTANT Resistant     AMPICILLIN/SULBACTAM 16 INTERMEDIATE Intermediate     PIP/TAZO <=4 SENSITIVE Sensitive     Extended ESBL NEGATIVE Sensitive     * >=100,000 COLONIES/mL ESCHERICHIA COLI    RADIOLOGY:  No results found.   Management plans discussed with the patient, family and they are in agreement.  CODE STATUS: Full Code   TOTAL TIME TAKING CARE OF THIS PATIENT: 40 minutes.    Berna Spare Jace Fermin  M.D on 02/16/2018 at 3:10 PM  Between 7am to 6pm - Pager - [phone]  After 6pm go to [email or web address] - Proofreader  [employer]  Hospitalists  Office  [phone]  CC: Primary care physician; Patient, No Pcp Per   Note: This dictation was prepared with Dragon dictation along with smaller phrase technology. Any transcriptional errors that result from this process are unintentional.

## 2018-02-16 NOTE — Discharge Instructions (Signed)
It was so nice to meet you during this hospitalization!  You were confused. We found that you had a UTI. We gave you some antibiotics through your IV and then started you on Keflex three times daily for 5 more days.  Please follow-up with your primary care doctor in the next 5 days.  -Dr. Brett Albino

## 2018-03-16 ENCOUNTER — Other Ambulatory Visit: Payer: Self-pay

## 2018-03-16 ENCOUNTER — Emergency Department
Admission: EM | Admit: 2018-03-16 | Discharge: 2018-03-16 | Disposition: A | Discharge status: Home / Self Care | Payer: Medicare HMO | Attending: Nurse Practitioner | Admitting: Nurse Practitioner

## 2018-03-16 DIAGNOSIS — R531 Weakness: Secondary | ICD-10-CM | POA: Insufficient documentation

## 2018-03-16 DIAGNOSIS — I110 Hypertensive heart disease with heart failure: Secondary | ICD-10-CM | POA: Insufficient documentation

## 2018-03-16 DIAGNOSIS — L98411 Non-pressure chronic ulcer of buttock limited to breakdown of skin: Secondary | ICD-10-CM | POA: Diagnosis not present

## 2018-03-16 DIAGNOSIS — I509 Heart failure, unspecified: Secondary | ICD-10-CM | POA: Diagnosis not present

## 2018-03-16 DIAGNOSIS — J449 Chronic obstructive pulmonary disease, unspecified: Secondary | ICD-10-CM | POA: Diagnosis not present

## 2018-03-16 DIAGNOSIS — K625 Hemorrhage of anus and rectum: Secondary | ICD-10-CM | POA: Diagnosis present

## 2018-03-16 DIAGNOSIS — I2510 Atherosclerotic heart disease of native coronary artery without angina pectoris: Secondary | ICD-10-CM | POA: Insufficient documentation

## 2018-03-16 LAB — CBC
HEMATOCRIT: 41.7 % (ref 36.0–46.0)
Hemoglobin: 12.8 g/dL (ref 12.0–15.0)
MCH: 29.7 pg (ref 26.0–34.0)
MCHC: 30.7 g/dL (ref 30.0–36.0)
MCV: 96.8 fL (ref 80.0–100.0)
Platelets: 166 10*3/uL (ref 150–400)
RBC: 4.31 MIL/uL (ref 3.87–5.11)
RDW: 13.8 % (ref 11.5–15.5)
WBC: 9.1 10*3/uL (ref 4.0–10.5)
nRBC: 0 % (ref 0.0–0.2)

## 2018-03-16 LAB — COMPREHENSIVE METABOLIC PANEL
ALT: 15 U/L (ref 0–44)
AST: 25 U/L (ref 15–41)
Albumin: 3.1 g/dL — ABNORMAL LOW (ref 3.5–5.0)
Alkaline Phosphatase: 49 U/L (ref 38–126)
Anion gap: 6 (ref 5–15)
BUN: 30 mg/dL — ABNORMAL HIGH (ref 8–23)
CALCIUM: 9.1 mg/dL (ref 8.9–10.3)
CO2: 40 mmol/L — AB (ref 22–32)
Chloride: 96 mmol/L — ABNORMAL LOW (ref 98–111)
Creatinine, Ser: 1.27 mg/dL — ABNORMAL HIGH (ref 0.44–1.00)
GFR calc Af Amer: 48 mL/min — ABNORMAL LOW (ref >60)
GFR calc non Af Amer: 42 mL/min — ABNORMAL LOW (ref >60)
Glucose, Bld: 95 mg/dL (ref 70–99)
Potassium: 4 mmol/L (ref 3.5–5.1)
Sodium: 142 mmol/L (ref 135–145)
Total Bilirubin: 0.6 mg/dL (ref 0.3–1.2)
Total Protein: 7.5 g/dL (ref 6.5–8.1)

## 2018-03-16 LAB — URINALYSIS, COMPLETE (UACMP) WITH MICROSCOPIC
BILIRUBIN URINE: NEGATIVE
Bacteria, UA: NONE SEEN
Glucose, UA: NEGATIVE mg/dL
Hgb urine dipstick: NEGATIVE
Ketones, ur: NEGATIVE mg/dL
Leukocytes, UA: NEGATIVE
Nitrite: NEGATIVE
Protein, ur: NEGATIVE mg/dL
Specific Gravity, Urine: 1.015 (ref 1.005–1.030)
pH: 6 (ref 5.0–8.0)

## 2018-03-16 LAB — TROPONIN I: Troponin I: <0.03 ng/mL (ref <0.03)

## 2018-03-16 MED ORDER — ACETAMINOPHEN 500 MG PO TABS
1000.0000 mg | ORAL_TABLET | Freq: Once | ORAL | Status: AC
  Administered 2018-03-16: 1000 mg via ORAL
  Filled 2018-03-16: qty 2

## 2018-03-16 MED ORDER — BACITRACIN-NEOMYCIN-POLYMYXIN 400-5-5000 EX OINT
TOPICAL_OINTMENT | Freq: Once | CUTANEOUS | Status: AC
  Administered 2018-03-16: 11:00:00 via TOPICAL
  Filled 2018-03-16: qty 1

## 2018-03-16 NOTE — ED Notes (Signed)
Per Larene Beach, Charge RN pt's son will not be able to pick up until after 5.

## 2018-03-16 NOTE — Progress Notes (Signed)
   03/16/18 1400  Clinical Encounter Type  Visited With Patient  Visit Type Initial;Spiritual support  Spiritual Encounters  Spiritual Needs Prayer;Emotional  Stress Factors  Patient Stress Factors Health changes    Chaplain paged to talk with the patient, per her request. Patient is lying in bed and she is awake and alert. She  She was welcoming and kind. Patient states that she is waiting for her son [name]) to arrive to take her home this afternoon. She states that, "God is good all the time", when she says that she is at home with her son caring for her. She states that she has pain and is sore on her bottom, which is uncomfortable. Patient requests prayer for the aforementioned as well as for her family, their health, and for comfort after the loss of her cousin in [place].

## 2018-03-16 NOTE — Discharge Instructions (Addendum)
Please make sure that you are changing positions every 20 minutes to decrease ulcers in your skin.  For the abrasions that you do have, you may apply Neosporin and a thick coat 3 times daily.  Return to the emergency department if you develop severe pain, lightheadedness or fainting, fever, or any other symptoms concerning to you.

## 2018-03-16 NOTE — ED Triage Notes (Signed)
Pt arrived via EMS from home d/t increased weakness from baseline and rectal bleeding observed from son who is primary care taker. Pt has hx of recurrent UTI and wears 3L O2 at home chronically. Pt is A&O at baseline at this time.

## 2018-03-16 NOTE — ED Notes (Signed)
Pt requesting her finger nails be cut. Explained to patient this RN and EDP would not be able to cut her fingernails.

## 2018-03-16 NOTE — ED Notes (Signed)
Legal Claude Manges (pt son) contacted and stated he would not be able to pick pt up from discharge until later today. Pt unable to be transported by EMS home d/t no one being home.

## 2018-03-16 NOTE — ED Notes (Signed)
Pt repositioned on R side with two pillows underneath. Call light on bed rail, pt instructed to use it for her needs. Pt given warm blanket.

## 2018-03-16 NOTE — ED Notes (Signed)
Pt given phone to call a friend at this time.

## 2018-03-16 NOTE — ED Notes (Signed)
Report off to gracie  rn  

## 2018-03-16 NOTE — ED Provider Notes (Signed)
[facility] Emergency Department Provider Note  ____________________________________________  Time seen: Approximately 10:27 AM  I have reviewed the triage vital signs and the nursing notes.   HISTORY  Chief Complaint Weakness    HPI [name] [name] is a 73 y.o. female with a history of morbid obesity and prior pressure ulcers of the skin, mostly bedbound, COPD on 3 L nasal cannula, CAD with a history of CHF, presenting for possible rectal bleeding, skin breakdown on the buttocks, and generalized weakness.  The patient is demented and unable to give any history.she denies any pain or complaints at this time, but does exhibit pain with movement on her buttocks.  Per EMS report, the patient was too weak to do physical therapy this morning, and her sense of rectal bleeding.  Unfortunately, the family has not yet arrived for further evaluation.  On arrival to the emergency department, the patient is hemodynamically stable.  Past Medical History:  Diagnosis Date  . Anxiety   . Arthritis   . Bronchitis   . Bursitis   . CAD (coronary artery disease)   . CHF (congestive heart failure) ([place])   . Chronic cough   . Chronic kidney disease    shadow on x-ray  . COPD (chronic obstructive pulmonary disease) ([place])   . Depression   . DM2 (diabetes mellitus, type 2) ([place])   . Environmental allergies   . GERD (gastroesophageal reflux disease)   . Hypercholesteremia   . Hypertension   . Left ventricular outflow tract obstruction    a. echo 03/2014: EF 60-65%, hypernamic LV systolic fxn, mod LVH w/ LVOT gradient estimated at 68 mm Hg w/ valsalva, very small LV internal cavity size, mildly increased LV posterior wall thickness, mild Ao valve scl w/o stenosis, diastolic dysfunction, normal RVSP  . Lower extremity edema   . LVH (left ventricular hypertrophy)    a. echo suggests long standing uncontrolled htn. she will not do well when dehydrated, LV cavity obliteration  . Muscle  weakness   . Obesity   . On supplemental oxygen therapy    AS NEEDED  . OSA (obstructive sleep apnea)    does not use machine  . Osteoarthritis   . Schizophrenia ([place])   . Tremors of nervous system   . Wheezing     Patient Active Problem List   Diagnosis Date Noted  . AMS (altered mental status) 02/14/2018  . Pressure injury of skin 11/08/2017  . Acute and chronic respiratory failure with hypercapnia ([place]) 11/05/2017  . Acute renal failure (ARF) ([place]) 08/05/2017  . ARF (acute renal failure) ([place]) 08/03/2017  . UTI (urinary tract infection) 01/16/2016  . Altered mental status 01/16/2016  . Metabolic encephalopathy   . Acute on chronic respiratory failure with hypoxia and hypercapnia ([place]) 10/13/2015  . Involuntary commitment 09/02/2015  . Schizoaffective disorder, bipolar type ([place])   . Urinary incontinence 10/30/2014  . GERD (gastroesophageal reflux disease) 10/30/2014  . OSA (obstructive sleep apnea) 10/30/2014  . Osteoarthrosis, unspecified whether generalized or localized, involving lower leg 10/30/2014  . COPD (chronic obstructive pulmonary disease) ([place]) 10/30/2014  . Chronic diastolic CHF (congestive heart failure) ([place]) 05/15/2014  . Morbid obesity ([place])   . History of colon polyps 03/09/2013  . Renal mass 06/26/2011  . Lytic bone lesion of hip 06/25/2011  . Hypertension 06/24/2011  . Diabetes mellitus ([place]) 06/24/2011    Past Surgical History:  Procedure Laterality Date  . CATARACT EXTRACTION W/PHACO Left 09/10/2014   Procedure: CATARACT EXTRACTION PHACO  AND INTRAOCULAR LENS PLACEMENT (IOC);  Surgeon: Birder Robson, MD;  Location: [facility] ORS;  Service: Ophthalmology;  Laterality: Left;  [place]: 00:44 AP%: 22.8 CDE: 10.24 Fluid lot #[id number]  . CATARACT EXTRACTION W/PHACO Right 10/15/2014   Procedure: CATARACT EXTRACTION PHACO AND INTRAOCULAR LENS PLACEMENT (IOC);  Surgeon: Birder Robson, MD;  Location: [facility] ORS;  Service: Ophthalmology;  Laterality: Right;  [place]:  00:52 AP:40.1 CDE:11.83 LOT PACK #[id number]  . CHOLECYSTECTOMY    . COLONOSCOPY  04/2011   [facility] per patient incomplete  . EYE SURGERY    . JOINT REPLACEMENT     TKR  . KNEE RECONSTRUCTION, MEDIAL PATELLAR FEMORAL LIGAMENT    . RENAL BIOPSY    . TONSILLECTOMY    . TONSILLECTOMY      Current Outpatient Rx  . Order #: [id number] Class: Normal  . Order #: [id number] Class: Historical Med  . Order #: [id number] Class: Historical Med  . Order #: [id number] Class: Historical Med  . Order #: [id number] Class: Historical Med  . Order #: [id number] Class: Print  . Order #: [id number] Class: Historical Med  . Order #: [id number] Class: Historical Med  . Order #: [id number] Class: Print  . Order #: [id number] Class: Historical Med  . Order #: [id number] Class: Historical Med  . Order #: [id number] Class: Historical Med  . Order #: [id number] Class: Historical Med  . Order #: [id number] Class: Print  . Order #: [id number] Class: Print  . Order #: [id number] Class: Historical Med  . Order #: [id number] Class: Historical Med  . Order #: [id number] Class: Historical Med  . Order #: [id number] Class: Historical Med  . Order #: [id number] Class: Historical Med  . Order #: [id number] Class: Historical Med  . Order #: [id number] Class: Historical Med    Allergies Haldol [haloperidol decanoate]; Prednisone; Raspberry; and Metformin  Family History  Problem Relation Age of Onset  . Heart attack Mother   . Colon cancer Neg Hx   . Liver disease Neg Hx     Social History Social History   Tobacco Use  . Smoking status: Former Smoker    Packs/day: 3.00    Years: 40.00    Pack years: 120.00  . Smokeless tobacco: Never Used  . Tobacco comment: quit in 2009  Substance Use Topics  . Alcohol use: No    Comment: occ.  . Drug use: No    Review of Systems Unable to obtain due to patient dementia.   ____________________________________________   PHYSICAL EXAM:  VITAL SIGNS: ED Triage Vitals  Enc Vitals Group     BP  03/16/18 1023 (!) 122/92     Pulse Rate 03/16/18 1023 73     Resp 03/16/18 1023 13     Temp 03/16/18 1023 98.5 F (36.9 C)     Temp Source 03/16/18 1023 Oral     SpO2 03/16/18 1023 100 %     Weight 03/16/18 1018 275 lb (124.7 kg)     Height 03/16/18 1018 5\' 6"  (1.676 m)     Head Circumference --      Peak Flow --      Pain Score 03/16/18 1018 0     Pain Loc --      Pain Edu? --      Excl. in [place]? --     Constitutional: The patient is alert but not oriented.  She has clear speech and answer some questions appropriately.  She is able to comply with basic commands. Eyes: Conjunctivae are normal.  EOMI. No scleral icterus. Head: Atraumatic. Nose: No congestion/rhinnorhea. Mouth/Throat: Mucous membranes are mildly dry.  Neck: No  stridor.  Supple.  No JVD.  No meningismus. Cardiovascular: Normal rate, regular rhythm. No murmurs, rubs or gallops.  Respiratory: Normal respiratory effort.  No accessory muscle use or retractions. Lungs CTAB.  No wheezes, rales or ronchi. Gastrointestinal: Morbidly obese.  Soft, nontender and nondistended.  No guarding or rebound.  No peritoneal signs. Genitourinary: The patient has multiple superficial abrasions that are approximately 1 to 1.5 cm in circular on the buttocks bilaterally without evidence of purulence, significant drainage; she does have surrounding erythema.  Her rectal examination shows no external hemorrhoids no palpable internal hemorrhoids with brown stool with mucus that is guaiac negative.  No pain with rectal examination. Musculoskeletal: + bilateral symmetric LE edema. No ttp in the calves or palpable cords.  Negative Homan's sign. Neurologic:  A&Ox3.  Speech is clear.  Face and smile are symmetric.  EOMI.  Moves all extremities well. Skin:  Skin is warm, dry. Noted. Psychiatric: Mood and affect are normal.   ____________________________________________   LABS (all labs ordered are listed, but only abnormal results are  displayed)  Labs Reviewed  COMPREHENSIVE METABOLIC PANEL - Abnormal; Notable for the following components:      Result Value   Chloride 96 (*)    CO2 40 (*)    BUN 30 (*)    Creatinine, Ser 1.27 (*)    Albumin 3.1 (*)    GFR calc non Af Amer 42 (*)    GFR calc Af Amer 48 (*)    All other components within normal limits  URINALYSIS, COMPLETE (UACMP) WITH MICROSCOPIC - Abnormal; Notable for the following components:   Color, Urine YELLOW (*)    APPearance CLEAR (*)    All other components within normal limits  CBC  TROPONIN I   ____________________________________________  EKG  ED ECG REPORT I, Anne-Caroline Mariea Clonts, the attending physician, personally viewed and interpreted this ECG.   Date: 03/16/2018  EKG Time: 1036  Rate: 73  Rhythm: normal sinus rhythm; PAC  Axis: normal  Intervals:none  ST&T Change: No STEMI   ____________________________________________  RADIOLOGY  No results found.  ____________________________________________   PROCEDURES  Procedure(s) performed: None  Procedures  Critical Care performed: No ____________________________________________   INITIAL IMPRESSION / ASSESSMENT AND PLAN / ED COURSE  Pertinent labs & imaging results that were available during my care of the patient were reviewed by me and considered in my medical decision making (see chart for details).  73 y.o. female, bedbound with dementia, COPD, pressure ulcers, presenting w/ possible rectal bleeding, and generalized weakness.  Overall, the patient is hemodynamically stable.  We will do weakness work-up, including rule out UTI, electrolyte imbalance, or new anemia.  From a clinical standpoint, I do see evidence of skin breakdown on her buttocks without any evidence of severe tunneling.  I will dressed these with Neosporin and have encouraged the patient to be cognitive of positional changes throughout the day.  I have asked the nurse to talk to the family about positional  changes as well.  I do not see any evidence of bleeding on my exam.  Plan reevaluation for final disposition.  ----------------------------------------- 12:55 PM on 03/16/2018 -----------------------------------------  The patient's work-up in the emergency department has been reassuring.  She does not have a UTI.  Her blood counts are within normal limits and her electrolytes are reassuring.  She does have an chronic renal insufficiency that is grossly unchanged.  Her troponin is negative.  At this time, we will plan to discharge the patient home.  I have given specific instructions on how to dress her superficial buttock abrasions and prevent pressure ulcers in the future.  Return precautions were discussed.  ____________________________________________  FINAL CLINICAL IMPRESSION(S) / ED DIAGNOSES  Final diagnoses:  Weakness  Skin ulcer of buttock, limited to breakdown of skin ([place])         NEW MEDICATIONS STARTED DURING THIS VISIT:  New Prescriptions   No medications on file      Eula Listen, MD 03/16/18 1256

## 2018-03-26 ENCOUNTER — Inpatient Hospital Stay
Admission: EM | Admit: 2018-03-26 | Discharge: 2018-04-04 | DRG: 640 | Disposition: A | Discharge status: Skilled Nursing Facility | Payer: Medicare HMO | Attending: Internal Medicine | Admitting: Internal Medicine

## 2018-03-26 ENCOUNTER — Emergency Department: Payer: Medicare HMO

## 2018-03-26 ENCOUNTER — Other Ambulatory Visit: Payer: Self-pay

## 2018-03-26 DIAGNOSIS — Z09 Encounter for follow-up examination after completed treatment for conditions other than malignant neoplasm: Secondary | ICD-10-CM

## 2018-03-26 DIAGNOSIS — L89151 Pressure ulcer of sacral region, stage 1: Secondary | ICD-10-CM | POA: Diagnosis present

## 2018-03-26 DIAGNOSIS — K219 Gastro-esophageal reflux disease without esophagitis: Secondary | ICD-10-CM | POA: Diagnosis present

## 2018-03-26 DIAGNOSIS — F0280 Dementia in other diseases classified elsewhere without behavioral disturbance: Secondary | ICD-10-CM | POA: Diagnosis present

## 2018-03-26 DIAGNOSIS — Z91018 Allergy to other foods: Secondary | ICD-10-CM

## 2018-03-26 DIAGNOSIS — Z87891 Personal history of nicotine dependence: Secondary | ICD-10-CM

## 2018-03-26 DIAGNOSIS — I248 Other forms of acute ischemic heart disease: Secondary | ICD-10-CM | POA: Diagnosis present

## 2018-03-26 DIAGNOSIS — Z96659 Presence of unspecified artificial knee joint: Secondary | ICD-10-CM | POA: Diagnosis present

## 2018-03-26 DIAGNOSIS — R7989 Other specified abnormal findings of blood chemistry: Secondary | ICD-10-CM

## 2018-03-26 DIAGNOSIS — E860 Dehydration: Principal | ICD-10-CM | POA: Diagnosis present

## 2018-03-26 DIAGNOSIS — J449 Chronic obstructive pulmonary disease, unspecified: Secondary | ICD-10-CM | POA: Diagnosis present

## 2018-03-26 DIAGNOSIS — Z6841 Body Mass Index (BMI) 40.0 and over, adult: Secondary | ICD-10-CM

## 2018-03-26 DIAGNOSIS — Z888 Allergy status to other drugs, medicaments and biological substances status: Secondary | ICD-10-CM

## 2018-03-26 DIAGNOSIS — F0390 Unspecified dementia without behavioral disturbance: Secondary | ICD-10-CM

## 2018-03-26 DIAGNOSIS — Z7189 Other specified counseling: Secondary | ICD-10-CM

## 2018-03-26 DIAGNOSIS — M1990 Unspecified osteoarthritis, unspecified site: Secondary | ICD-10-CM | POA: Diagnosis present

## 2018-03-26 DIAGNOSIS — I130 Hypertensive heart and chronic kidney disease with heart failure and stage 1 through stage 4 chronic kidney disease, or unspecified chronic kidney disease: Secondary | ICD-10-CM | POA: Diagnosis present

## 2018-03-26 DIAGNOSIS — E785 Hyperlipidemia, unspecified: Secondary | ICD-10-CM | POA: Diagnosis present

## 2018-03-26 DIAGNOSIS — E876 Hypokalemia: Secondary | ICD-10-CM | POA: Diagnosis present

## 2018-03-26 DIAGNOSIS — G309 Alzheimer's disease, unspecified: Secondary | ICD-10-CM | POA: Diagnosis present

## 2018-03-26 DIAGNOSIS — Z9981 Dependence on supplemental oxygen: Secondary | ICD-10-CM

## 2018-03-26 DIAGNOSIS — Z515 Encounter for palliative care: Secondary | ICD-10-CM

## 2018-03-26 DIAGNOSIS — R778 Other specified abnormalities of plasma proteins: Secondary | ICD-10-CM | POA: Diagnosis present

## 2018-03-26 DIAGNOSIS — E7800 Pure hypercholesterolemia, unspecified: Secondary | ICD-10-CM | POA: Diagnosis present

## 2018-03-26 DIAGNOSIS — F209 Schizophrenia, unspecified: Secondary | ICD-10-CM | POA: Diagnosis present

## 2018-03-26 DIAGNOSIS — F250 Schizoaffective disorder, bipolar type: Secondary | ICD-10-CM | POA: Diagnosis present

## 2018-03-26 DIAGNOSIS — N170 Acute kidney failure with tubular necrosis: Secondary | ICD-10-CM | POA: Diagnosis present

## 2018-03-26 DIAGNOSIS — Z66 Do not resuscitate: Secondary | ICD-10-CM | POA: Diagnosis present

## 2018-03-26 DIAGNOSIS — R52 Pain, unspecified: Secondary | ICD-10-CM

## 2018-03-26 DIAGNOSIS — Z7401 Bed confinement status: Secondary | ICD-10-CM

## 2018-03-26 DIAGNOSIS — Z9842 Cataract extraction status, left eye: Secondary | ICD-10-CM

## 2018-03-26 DIAGNOSIS — Z86010 Personal history of colonic polyps: Secondary | ICD-10-CM

## 2018-03-26 DIAGNOSIS — I5032 Chronic diastolic (congestive) heart failure: Secondary | ICD-10-CM | POA: Diagnosis present

## 2018-03-26 DIAGNOSIS — Z7982 Long term (current) use of aspirin: Secondary | ICD-10-CM

## 2018-03-26 DIAGNOSIS — Z794 Long term (current) use of insulin: Secondary | ICD-10-CM

## 2018-03-26 DIAGNOSIS — Z9841 Cataract extraction status, right eye: Secondary | ICD-10-CM

## 2018-03-26 DIAGNOSIS — Z961 Presence of intraocular lens: Secondary | ICD-10-CM | POA: Diagnosis present

## 2018-03-26 DIAGNOSIS — I2510 Atherosclerotic heart disease of native coronary artery without angina pectoris: Secondary | ICD-10-CM | POA: Diagnosis present

## 2018-03-26 DIAGNOSIS — Z9049 Acquired absence of other specified parts of digestive tract: Secondary | ICD-10-CM

## 2018-03-26 DIAGNOSIS — N183 Chronic kidney disease, stage 3 (moderate): Secondary | ICD-10-CM | POA: Diagnosis present

## 2018-03-26 DIAGNOSIS — E1122 Type 2 diabetes mellitus with diabetic chronic kidney disease: Secondary | ICD-10-CM | POA: Diagnosis present

## 2018-03-26 DIAGNOSIS — R531 Weakness: Secondary | ICD-10-CM

## 2018-03-26 DIAGNOSIS — G4733 Obstructive sleep apnea (adult) (pediatric): Secondary | ICD-10-CM | POA: Diagnosis present

## 2018-03-26 DIAGNOSIS — Z8249 Family history of ischemic heart disease and other diseases of the circulatory system: Secondary | ICD-10-CM

## 2018-03-26 LAB — HEPATIC FUNCTION PANEL
ALBUMIN: 3.5 g/dL (ref 3.5–5.0)
ALT: 14 U/L (ref 0–44)
AST: 24 U/L (ref 15–41)
Alkaline Phosphatase: 47 U/L (ref 38–126)
Bilirubin, Direct: 0.1 mg/dL (ref 0.0–0.2)
Indirect Bilirubin: 0.5 mg/dL (ref 0.3–0.9)
Total Bilirubin: 0.6 mg/dL (ref 0.3–1.2)
Total Protein: 7.8 g/dL (ref 6.5–8.1)

## 2018-03-26 LAB — CBC
HEMATOCRIT: 41 % (ref 36.0–46.0)
Hemoglobin: 12.8 g/dL (ref 12.0–15.0)
MCH: 29.5 pg (ref 26.0–34.0)
MCHC: 31.2 g/dL (ref 30.0–36.0)
MCV: 94.5 fL (ref 80.0–100.0)
Platelets: 201 10*3/uL (ref 150–400)
RBC: 4.34 MIL/uL (ref 3.87–5.11)
RDW: 13.8 % (ref 11.5–15.5)
WBC: 9.7 10*3/uL (ref 4.0–10.5)
nRBC: 0 % (ref 0.0–0.2)

## 2018-03-26 LAB — BASIC METABOLIC PANEL
Anion gap: 8 (ref 5–15)
BUN: 16 mg/dL (ref 8–23)
CHLORIDE: 94 mmol/L — AB (ref 98–111)
CO2: 40 mmol/L — ABNORMAL HIGH (ref 22–32)
Calcium: 9.5 mg/dL (ref 8.9–10.3)
Creatinine, Ser: 1.09 mg/dL — ABNORMAL HIGH (ref 0.44–1.00)
GFR calc Af Amer: 58 mL/min — ABNORMAL LOW (ref >60)
GFR calc non Af Amer: 50 mL/min — ABNORMAL LOW (ref >60)
Glucose, Bld: 107 mg/dL — ABNORMAL HIGH (ref 70–99)
POTASSIUM: 3.3 mmol/L — AB (ref 3.5–5.1)
Sodium: 142 mmol/L (ref 135–145)

## 2018-03-26 MED ORDER — SODIUM CHLORIDE 0.9% FLUSH: 3.0000 mL | Freq: Once | INTRAVENOUS | Status: DC

## 2018-03-26 NOTE — ED Provider Notes (Signed)
[facility] Emergency Department Provider Note   ____________________________________________   First MD Initiated Contact with Patient 03/26/18 2310     (approximate)  I have reviewed the triage vital signs and the nursing notes.   HISTORY  Chief Complaint Weakness; Rectal Pain; and Leg Pain  Level V caveat: limited by dementia  HPI [name] [name] is a 72 y.o. female brought to the ED from home via EMS with a chief complaint of generalized weakness.  Patient has a history of COPD, CHF on continuous oxygenation, diabetes, CKD who was in rehab but had been discharged home and living with her son for the past couple of months.  Son states patient is supposed to ambulate but does not ambulate at home.  Essentially patient is bedbound.  Son also states patient is supposed to receive CPAP and had not yet received it.  Does not feel like he has enough help to be able to take care of the patient at home.  Patient complains of buttock pain secondary to decubitus ulcers.  Son reports bilateral lower extremity swelling is baseline.   Patient denies recent fever, chills, chest pain, shortness breath, abdominal pain, nausea, vomiting, diarrhea.  Son states patient has frequent UTIs.  Denies recent travel or trauma.   Past Medical History:  Diagnosis Date  . Anxiety   . Arthritis   . Bronchitis   . Bursitis   . CAD (coronary artery disease)   . CHF (congestive heart failure) ([place])   . Chronic cough   . Chronic kidney disease    shadow on x-ray  . COPD (chronic obstructive pulmonary disease) ([place])   . Depression   . DM2 (diabetes mellitus, type 2) ([place])   . Environmental allergies   . GERD (gastroesophageal reflux disease)   . Hypercholesteremia   . Hypertension   . Left ventricular outflow tract obstruction    a. echo 03/2014: EF 60-65%, hypernamic LV systolic fxn, mod LVH w/ LVOT gradient estimated at 68 mm Hg w/ valsalva, very small LV internal cavity size,  mildly increased LV posterior wall thickness, mild Ao valve scl w/o stenosis, diastolic dysfunction, normal RVSP  . Lower extremity edema   . LVH (left ventricular hypertrophy)    a. echo suggests long standing uncontrolled htn. she will not do well when dehydrated, LV cavity obliteration  . Muscle weakness   . Obesity   . On supplemental oxygen therapy    AS NEEDED  . OSA (obstructive sleep apnea)    does not use machine  . Osteoarthritis   . Schizophrenia ([place])   . Tremors of nervous system   . Wheezing     Patient Active Problem List   Diagnosis Date Noted  . Elevated troponin 03/27/2018  . AMS (altered mental status) 02/14/2018  . Pressure injury of skin 11/08/2017  . Acute and chronic respiratory failure with hypercapnia ([place]) 11/05/2017  . Acute renal failure (ARF) ([place]) 08/05/2017  . ARF (acute renal failure) ([place]) 08/03/2017  . UTI (urinary tract infection) 01/16/2016  . Altered mental status 01/16/2016  . Metabolic encephalopathy   . Acute on chronic respiratory failure with hypoxia and hypercapnia ([place]) 10/13/2015  . Involuntary commitment 09/02/2015  . Schizoaffective disorder, bipolar type ([place])   . Urinary incontinence 10/30/2014  . GERD (gastroesophageal reflux disease) 10/30/2014  . OSA (obstructive sleep apnea) 10/30/2014  . Osteoarthrosis, unspecified whether generalized or localized, involving lower leg 10/30/2014  . COPD (chronic obstructive pulmonary disease) ([place]) 10/30/2014  . Chronic  diastolic CHF (congestive heart failure) ([place]) 05/15/2014  . Morbid obesity ([place])   . History of colon polyps 03/09/2013  . Renal mass 06/26/2011  . Lytic bone lesion of hip 06/25/2011  . Hypertension 06/24/2011  . Diabetes mellitus ([place]) 06/24/2011    Past Surgical History:  Procedure Laterality Date  . CATARACT EXTRACTION W/PHACO Left 09/10/2014   Procedure: CATARACT EXTRACTION PHACO AND INTRAOCULAR LENS PLACEMENT (IOC);  Surgeon: Birder Robson, MD;  Location:  [facility] ORS;  Service: Ophthalmology;  Laterality: Left;  [place]: 00:44 AP%: 22.8 CDE: 10.24 Fluid lot #[id number]  . CATARACT EXTRACTION W/PHACO Right 10/15/2014   Procedure: CATARACT EXTRACTION PHACO AND INTRAOCULAR LENS PLACEMENT (IOC);  Surgeon: Birder Robson, MD;  Location: [facility] ORS;  Service: Ophthalmology;  Laterality: Right;  [place]: 00:52 AP:40.1 CDE:11.83 LOT PACK #[id number]  . CHOLECYSTECTOMY    . COLONOSCOPY  04/2011   [facility] per patient incomplete  . EYE SURGERY    . JOINT REPLACEMENT     TKR  . KNEE RECONSTRUCTION, MEDIAL PATELLAR FEMORAL LIGAMENT    . RENAL BIOPSY    . TONSILLECTOMY    . TONSILLECTOMY      Prior to Admission medications   Medication Sig Start Date End Date Taking? Authorizing Provider  acetaminophen (TYLENOL) 325 MG tablet Take 2 tablets (650 mg total) by mouth every 4 (four) hours as needed for mild pain (temp > 101.5). 10/20/15  Yes Gladstone Lighter, MD  albuterol (PROVENTIL HFA;VENTOLIN HFA) 108 (90 Base) MCG/ACT inhaler Inhale 2 puffs into the lungs every 6 (six) hours as needed for wheezing or shortness of breath.   Yes [provider]  aspirin EC 81 MG tablet Take 81 mg by mouth daily.   Yes [provider]  atorvastatin (LIPITOR) 20 MG tablet Take 20 mg by mouth at bedtime.   Yes [provider]  benztropine (COGENTIN) 0.5 MG tablet Take 0.5 mg by mouth 2 (two) times daily.   Yes [provider]  carvedilol (COREG) 12.5 MG tablet Take 1 tablet (12.5 mg total) by mouth 2 (two) times daily. 11/08/17  Yes Salary, [name], MD  cetirizine (ZYRTEC) 10 MG tablet Take 10 mg by mouth daily.   Yes [provider]  cholecalciferol (VITAMIN D) 1000 units tablet Take 1,000 Units by mouth daily.   Yes [provider]  divalproex (DEPAKOTE) 500 MG DR tablet Take 1 tablet (500 mg total) by mouth 3 (three) times daily. 08/15/17  Yes Wieting, Richard, MD  docusate sodium (COLACE) 100 MG capsule Take 100 mg by mouth 2 (two)  times daily.    Yes [provider]  famotidine (PEPCID) 20 MG tablet Take 20 mg by mouth 2 (two) times daily.   Yes [provider]  fluPHENAZine (PROLIXIN) 5 MG/ML solution Take 5 mg by mouth 2 (two) times daily.    Yes [provider]  Fluticasone-Salmeterol (ADVAIR) 250-50 MCG/DOSE AEPB Inhale 1 puff into the lungs 2 (two) times daily.   Yes [provider]  furosemide (LASIX) 20 MG tablet Take 4 tablets (80 mg total) by mouth daily. 02/16/18  Yes Mayo, Pete Pelt, MD  insulin aspart (NOVOLOG) 100 UNIT/ML injection Inject 4 Units into the skin 4 (four) times daily. 02/16/18  Yes Mayo, Pete Pelt, MD  insulin glargine (LANTUS) 100 UNIT/ML injection Inject 11 Units into the skin at bedtime.    Yes [provider]  levothyroxine (SYNTHROID, LEVOTHROID) 75 MCG tablet Take 1 tablet by mouth daily. 04/05/17  Yes [provider]  losartan (COZAAR) 25 MG tablet Take 25 mg by mouth daily.   Yes [provider]  Melatonin (SM MELATONIN) 3 MG TABS Take 3 mg by mouth at bedtime as needed (sleep).   Yes [provider]  nitroGLYCERIN (NITROSTAT) 0.4 MG SL tablet Place 0.4 mg under the tongue every 5 (five) minutes as needed for chest pain.   Yes [provider]  sitaGLIPtin (JANUVIA) 50 MG tablet Take 50 mg by mouth daily.   Yes [provider]  umeclidinium bromide (INCRUSE ELLIPTA) 62.5 MCG/INH AEPB Inhale 1 puff into the lungs daily.   Yes [provider]    Allergies Haldol [haloperidol decanoate]; Prednisone; Raspberry; and Metformin  Family History  Problem Relation Age of Onset  . Heart attack Mother   . Colon cancer Neg Hx   . Liver disease Neg Hx     Social History Social History   Tobacco Use  . Smoking status: Former Smoker    Packs/day: 3.00    Years: 40.00    Pack years: 120.00  . Smokeless tobacco: Never Used  . Tobacco comment: quit in 2009  Substance Use Topics  . Alcohol use: No     Comment: occ.  . Drug use: No    Review of Systems  Constitutional: Positive for generalized weakness.  No fever/chills Eyes: No visual changes. ENT: No sore throat. Cardiovascular: Denies chest pain. Respiratory: Denies shortness of breath. Gastrointestinal: No abdominal pain.  No nausea, no vomiting.  No diarrhea.  No constipation. Genitourinary: Negative for dysuria. Musculoskeletal: Negative for back pain. Skin: Positive for buttock pain.  Negative for rash. Neurological: Negative for headaches, focal weakness or numbness.   ____________________________________________   PHYSICAL EXAM:  VITAL SIGNS: ED Triage Vitals  Enc Vitals Group     BP 03/26/18 2226 (!) 145/102     Pulse Rate 03/26/18 2226 62     Resp --      Temp 03/26/18 2226 98 F (36.7 C)     Temp Source 03/26/18 2226 Oral     SpO2 03/26/18 2226 100 %     Weight 03/26/18 2228 274 lb 14.6 oz (124.7 kg)     Height 03/26/18 2228 5\' 6"  (1.676 m)     Head Circumference --      Peak Flow --      Pain Score 03/26/18 2228 6     Pain Loc --      Pain Edu? --      Excl. in [place]? --     Constitutional: Alert and oriented.  Elderly appearing and in mild acute distress. Eyes: Conjunctivae are normal. PERRL. EOMI. Head: Atraumatic. Nose: No congestion/rhinnorhea. Mouth/Throat: Mucous membranes are moist.  Oropharynx non-erythematous. Neck: No stridor.  Supple neck without meningismus. Cardiovascular: Normal rate, regular rhythm. Grossly normal heart sounds.  Good peripheral circulation. Respiratory: Normal respiratory effort.  No retractions. Lungs mildly diminished bibasilarly. Gastrointestinal: Obese.  Soft and nontender. No distention. No abdominal bruits. No CVA tenderness. Musculoskeletal: No lower extremity tenderness.  1+ BLE edema.  No joint effusions. Neurologic: Alert and oriented to person.  Normal speech and language. No gross focal neurologic deficits are appreciated.  Skin:  Skin is warm, dry and  intact. No rash noted. Psychiatric: Mood and affect are normal. Speech and behavior are normal.  ____________________________________________   LABS (all labs ordered are listed, but only abnormal results are displayed)  Labs Reviewed  BASIC METABOLIC PANEL - Abnormal; Notable for the following components:  Result Value   Potassium 3.3 (*)    Chloride 94 (*)    CO2 40 (*)    Glucose, Bld 107 (*)    Creatinine, Ser 1.09 (*)    GFR calc non Af Amer 50 (*)    GFR calc Af Amer 58 (*)    All other components within normal limits  URINALYSIS, COMPLETE (UACMP) WITH MICROSCOPIC - Abnormal; Notable for the following components:   Color, Urine YELLOW (*)    APPearance CLEAR (*)    Hgb urine dipstick SMALL (*)    All other components within normal limits  TROPONIN I - Abnormal; Notable for the following components:   Troponin I 0.05 (*)    All other components within normal limits  CBC  HEPATIC FUNCTION PANEL  TROPONIN I  TROPONIN I  MAGNESIUM  PHOSPHORUS  CALCIUM, IONIZED  PREALBUMIN  CBG MONITORING, ED   ____________________________________________  EKG  ED ECG REPORT I, SUNG,JADE J, the attending physician, personally viewed and interpreted this ECG.   Date: 03/26/2018  EKG Time: 2255  Rate: 85  Rhythm: normal EKG, normal sinus rhythm  Axis: Normal  Intervals:none  ST&T Change: Inverted T waves inferior laterally No significant change from 03/17/2018 ____________________________________________  RADIOLOGY  ED MD interpretation: Mild cardiomegaly; otherwise no acute cardiopulmonary process  Official radiology report(s): Dg Chest Port 1 View  Result Date: 03/26/2018 CLINICAL DATA:  Increasing we had dizziness for 1 week.  Leg pain. EXAM: PORTABLE CHEST 1 VIEW COMPARISON:  Chest radiograph November 04, 2017 FINDINGS: Cardiac silhouette is mildly enlarged, decreased from prior examination. Granuloma versus pulmonary vessel en face projecting RIGHT heart.  Mediastinal silhouette is not suspicious. No pleural effusion or focal consolidation no pneumothorax. Soft tissue planes and included osseous structures are non suspicious. IMPRESSION: 1. Mild cardiomegaly.  No acute pulmonary process. Electronically Signed   By: Elon Alas M.D.   On: 03/26/2018 23:41    ____________________________________________   PROCEDURES  Procedure(s) performed:   Rectal exam: External exam reveals several superficial decubitus ulcers. Brown stool on gloved finger which is heme-negative.  Procedures  Critical Care performed: No  ____________________________________________   INITIAL IMPRESSION / ASSESSMENT AND PLAN / ED COURSE  As part of my medical decision making, I reviewed the following data within the [place] History obtained from family, Nursing notes reviewed and incorporated, Labs reviewed, EKG interpreted, Old chart reviewed, Radiograph reviewed and Notes from prior ED visits    73 year old female with multiple medical issues who is deconditioned and presents with increasing generalized weakness and inability to ambulate.  Differential diagnosis includes but is not limited to acute on chronic disease, infectious, metabolic etiologies, etc.  Laboratory results thus far unremarkable.  Will check troponin and urinalysis.  [name] is at bedside.  We discussed that he is really unable to take care of her anymore at home.  If patient does not meet inpatient hospitalization criteria, will board in the ED overnight for social work consult for placement.   Clinical Course as of Mar 27 120  Mon Mar 27, 2018  0120 Patient was discussed with Dr. Aliene Altes who evaluated her in the emergency department for admission.   [JS]    Clinical Course User Index [JS] Paulette Blanch, MD     ____________________________________________   FINAL CLINICAL IMPRESSION(S) / ED DIAGNOSES  Final diagnoses:  Generalized weakness  Elevated  troponin  Dehydration  Decubitus ulcer of sacral region, stage 1     ED Discharge Orders  None       Note:  This document was prepared using Dragon voice recognition software and may include unintentional dictation errors.    Paulette Blanch, MD 03/27/18 [phone]

## 2018-03-26 NOTE — ED Notes (Signed)
Report given to Noel RN 

## 2018-03-26 NOTE — ED Triage Notes (Signed)
Pt arrives via ems from home, ems reports according to son that the patient has increased weakness over the past week, was seen by her dr on Monday and hasn't been standing much since then. Pt is c/o bilat leg pain with pain to her buttocks all over pt has swelling noted to bilat lower extrem. Son reports to ems that she has been unable to stand well so he has been having diff changing her depends, pt has a known wound to her buttocks according to ems

## 2018-03-27 ENCOUNTER — Encounter: Payer: Self-pay | Admitting: Internal Medicine

## 2018-03-27 ENCOUNTER — Inpatient Hospital Stay: Payer: Medicare HMO

## 2018-03-27 DIAGNOSIS — I130 Hypertensive heart and chronic kidney disease with heart failure and stage 1 through stage 4 chronic kidney disease, or unspecified chronic kidney disease: Secondary | ICD-10-CM | POA: Diagnosis present

## 2018-03-27 DIAGNOSIS — N183 Chronic kidney disease, stage 3 (moderate): Secondary | ICD-10-CM | POA: Diagnosis present

## 2018-03-27 DIAGNOSIS — K219 Gastro-esophageal reflux disease without esophagitis: Secondary | ICD-10-CM | POA: Diagnosis present

## 2018-03-27 DIAGNOSIS — F0390 Unspecified dementia without behavioral disturbance: Secondary | ICD-10-CM | POA: Diagnosis not present

## 2018-03-27 DIAGNOSIS — I2510 Atherosclerotic heart disease of native coronary artery without angina pectoris: Secondary | ICD-10-CM | POA: Diagnosis present

## 2018-03-27 DIAGNOSIS — Z9981 Dependence on supplemental oxygen: Secondary | ICD-10-CM | POA: Diagnosis not present

## 2018-03-27 DIAGNOSIS — Z86010 Personal history of colonic polyps: Secondary | ICD-10-CM | POA: Diagnosis not present

## 2018-03-27 DIAGNOSIS — F209 Schizophrenia, unspecified: Secondary | ICD-10-CM | POA: Diagnosis present

## 2018-03-27 DIAGNOSIS — Z7401 Bed confinement status: Secondary | ICD-10-CM | POA: Diagnosis not present

## 2018-03-27 DIAGNOSIS — L89151 Pressure ulcer of sacral region, stage 1: Secondary | ICD-10-CM | POA: Diagnosis not present

## 2018-03-27 DIAGNOSIS — I248 Other forms of acute ischemic heart disease: Secondary | ICD-10-CM | POA: Diagnosis present

## 2018-03-27 DIAGNOSIS — G4733 Obstructive sleep apnea (adult) (pediatric): Secondary | ICD-10-CM | POA: Diagnosis present

## 2018-03-27 DIAGNOSIS — Z9841 Cataract extraction status, right eye: Secondary | ICD-10-CM | POA: Diagnosis not present

## 2018-03-27 DIAGNOSIS — Z6841 Body Mass Index (BMI) 40.0 and over, adult: Secondary | ICD-10-CM | POA: Diagnosis not present

## 2018-03-27 DIAGNOSIS — E1122 Type 2 diabetes mellitus with diabetic chronic kidney disease: Secondary | ICD-10-CM | POA: Diagnosis present

## 2018-03-27 DIAGNOSIS — Z961 Presence of intraocular lens: Secondary | ICD-10-CM | POA: Diagnosis present

## 2018-03-27 DIAGNOSIS — Z9842 Cataract extraction status, left eye: Secondary | ICD-10-CM | POA: Diagnosis not present

## 2018-03-27 DIAGNOSIS — N170 Acute kidney failure with tubular necrosis: Secondary | ICD-10-CM | POA: Diagnosis present

## 2018-03-27 DIAGNOSIS — Z515 Encounter for palliative care: Secondary | ICD-10-CM | POA: Diagnosis not present

## 2018-03-27 DIAGNOSIS — Z7189 Other specified counseling: Secondary | ICD-10-CM | POA: Diagnosis not present

## 2018-03-27 DIAGNOSIS — Z09 Encounter for follow-up examination after completed treatment for conditions other than malignant neoplasm: Secondary | ICD-10-CM | POA: Diagnosis not present

## 2018-03-27 DIAGNOSIS — R778 Other specified abnormalities of plasma proteins: Secondary | ICD-10-CM | POA: Diagnosis present

## 2018-03-27 DIAGNOSIS — J449 Chronic obstructive pulmonary disease, unspecified: Secondary | ICD-10-CM | POA: Diagnosis present

## 2018-03-27 DIAGNOSIS — Z9049 Acquired absence of other specified parts of digestive tract: Secondary | ICD-10-CM | POA: Diagnosis not present

## 2018-03-27 DIAGNOSIS — I5032 Chronic diastolic (congestive) heart failure: Secondary | ICD-10-CM | POA: Diagnosis present

## 2018-03-27 DIAGNOSIS — M1990 Unspecified osteoarthritis, unspecified site: Secondary | ICD-10-CM | POA: Diagnosis present

## 2018-03-27 DIAGNOSIS — F250 Schizoaffective disorder, bipolar type: Secondary | ICD-10-CM | POA: Diagnosis present

## 2018-03-27 DIAGNOSIS — R7989 Other specified abnormal findings of blood chemistry: Secondary | ICD-10-CM

## 2018-03-27 DIAGNOSIS — E860 Dehydration: Secondary | ICD-10-CM | POA: Diagnosis not present

## 2018-03-27 DIAGNOSIS — E7800 Pure hypercholesterolemia, unspecified: Secondary | ICD-10-CM | POA: Diagnosis present

## 2018-03-27 LAB — PREALBUMIN: PREALBUMIN: 22.3 mg/dL (ref 18–38)

## 2018-03-27 LAB — URINALYSIS, COMPLETE (UACMP) WITH MICROSCOPIC
Bacteria, UA: NONE SEEN
Bilirubin Urine: NEGATIVE
Glucose, UA: NEGATIVE mg/dL
Ketones, ur: NEGATIVE mg/dL
Leukocytes, UA: NEGATIVE
Nitrite: NEGATIVE
PH: 5 (ref 5.0–8.0)
Protein, ur: NEGATIVE mg/dL
Specific Gravity, Urine: 1.014 (ref 1.005–1.030)

## 2018-03-27 LAB — MAGNESIUM: Magnesium: 1.8 mg/dL (ref 1.7–2.4)

## 2018-03-27 LAB — PHOSPHORUS: Phosphorus: 3.5 mg/dL (ref 2.5–4.6)

## 2018-03-27 LAB — BASIC METABOLIC PANEL
ANION GAP: 3 — AB (ref 5–15)
BUN: 15 mg/dL (ref 8–23)
CALCIUM: 9 mg/dL (ref 8.9–10.3)
CO2: 39 mmol/L — ABNORMAL HIGH (ref 22–32)
Chloride: 98 mmol/L (ref 98–111)
Creatinine, Ser: 0.99 mg/dL (ref 0.44–1.00)
GFR calc Af Amer: >60 mL/min (ref >60)
GFR, EST NON AFRICAN AMERICAN: 57 mL/min — AB (ref >60)
Glucose, Bld: 116 mg/dL — ABNORMAL HIGH (ref 70–99)
Potassium: 4.2 mmol/L (ref 3.5–5.1)
Sodium: 140 mmol/L (ref 135–145)

## 2018-03-27 LAB — TROPONIN I
Troponin I: 0.05 ng/mL (ref <0.03)
Troponin I: <0.03 ng/mL (ref <0.03)

## 2018-03-27 LAB — GLUCOSE, CAPILLARY
Glucose-Capillary: 111 mg/dL — ABNORMAL HIGH (ref 70–99)
Glucose-Capillary: 95 mg/dL (ref 70–99)
Glucose-Capillary: 161 mg/dL — ABNORMAL HIGH (ref 70–99)

## 2018-03-27 MED ORDER — MORPHINE SULFATE (PF) 4 MG/ML IV SOLN
4.0000 mg | INTRAVENOUS | Status: DC | PRN
  Administered 2018-03-28: 4 mg via INTRAVENOUS
  Filled 2018-03-27: qty 1

## 2018-03-27 MED ORDER — INSULIN ASPART 100 UNIT/ML ~~LOC~~ SOLN
0.0000 [IU] | Freq: Three times a day (TID) | SUBCUTANEOUS | Status: DC
  Administered 2018-03-28 (×2): 2 [IU] via SUBCUTANEOUS
  Administered 2018-03-29: 1 [IU] via SUBCUTANEOUS
  Administered 2018-03-30: 2 [IU] via SUBCUTANEOUS
  Administered 2018-03-31 – 2018-04-01 (×5): 1 [IU] via SUBCUTANEOUS
  Administered 2018-04-02: 2 [IU] via SUBCUTANEOUS
  Administered 2018-04-02: 1 [IU] via SUBCUTANEOUS
  Administered 2018-04-03: 3 [IU] via SUBCUTANEOUS
  Administered 2018-04-03: 2 [IU] via SUBCUTANEOUS
  Administered 2018-04-03 – 2018-04-04 (×2): 1 [IU] via SUBCUTANEOUS
  Filled 2018-03-27 (×14): qty 1

## 2018-03-27 MED ORDER — MOMETASONE FURO-FORMOTEROL FUM 200-5 MCG/ACT IN AERO
2.0000 | INHALATION_SPRAY | Freq: Two times a day (BID) | RESPIRATORY_TRACT | Status: DC
  Administered 2018-03-27 – 2018-04-04 (×16): 2 via RESPIRATORY_TRACT
  Filled 2018-03-27 (×2): qty 8.8

## 2018-03-27 MED ORDER — ASPIRIN 81 MG PO CHEW
324.0000 mg | CHEWABLE_TABLET | Freq: Once | ORAL | Status: AC
  Administered 2018-03-27: 324 mg via ORAL
  Filled 2018-03-27: qty 4

## 2018-03-27 MED ORDER — ENOXAPARIN SODIUM 40 MG/0.4ML ~~LOC~~ SOLN
40.0000 mg | Freq: Two times a day (BID) | SUBCUTANEOUS | Status: DC
  Administered 2018-03-27 – 2018-04-04 (×17): 40 mg via SUBCUTANEOUS
  Filled 2018-03-27 (×17): qty 0.4

## 2018-03-27 MED ORDER — ASPIRIN 81 MG PO CHEW
81.0000 mg | CHEWABLE_TABLET | Freq: Every day | ORAL | Status: DC
  Administered 2018-03-27 – 2018-04-04 (×9): 81 mg via ORAL
  Filled 2018-03-27 (×9): qty 1

## 2018-03-27 MED ORDER — INSULIN GLARGINE 100 UNIT/ML ~~LOC~~ SOLN
11.0000 [IU] | Freq: Every day | SUBCUTANEOUS | Status: DC
  Filled 2018-03-27: qty 0.11

## 2018-03-27 MED ORDER — UMECLIDINIUM BROMIDE 62.5 MCG/INH IN AEPB
1.0000 | INHALATION_SPRAY | Freq: Every day | RESPIRATORY_TRACT | Status: DC
  Administered 2018-03-27 – 2018-04-04 (×8): 1 via RESPIRATORY_TRACT
  Filled 2018-03-27 (×2): qty 7

## 2018-03-27 MED ORDER — MORPHINE SULFATE (PF) 2 MG/ML IV SOLN
2.0000 mg | INTRAVENOUS | Status: DC | PRN
  Administered 2018-03-27: 2 mg via INTRAVENOUS
  Filled 2018-03-27: qty 1

## 2018-03-27 MED ORDER — NITROGLYCERIN 0.4 MG SL SUBL: 0.4000 mg | SUBLINGUAL_TABLET | SUBLINGUAL | Status: DC | PRN

## 2018-03-27 MED ORDER — POTASSIUM CHLORIDE CRYS ER 20 MEQ PO TBCR
40.0000 meq | EXTENDED_RELEASE_TABLET | Freq: Once | ORAL | Status: AC
  Administered 2018-03-27: 40 meq via ORAL
  Filled 2018-03-27: qty 2

## 2018-03-27 MED ORDER — ONDANSETRON HCL 4 MG/2ML IJ SOLN: 4.0000 mg | Freq: Four times a day (QID) | INTRAMUSCULAR | Status: DC | PRN

## 2018-03-27 MED ORDER — ACETAMINOPHEN 325 MG PO TABS
650.0000 mg | ORAL_TABLET | Freq: Four times a day (QID) | ORAL | Status: DC | PRN
  Administered 2018-03-28: 650 mg via ORAL
  Filled 2018-03-27: qty 2

## 2018-03-27 MED ORDER — FLUPHENAZINE HCL 5 MG/ML PO CONC
5.0000 mg | Freq: Two times a day (BID) | ORAL | Status: DC
  Filled 2018-03-27 (×4): qty 1

## 2018-03-27 MED ORDER — FAMOTIDINE 20 MG PO TABS
20.0000 mg | ORAL_TABLET | Freq: Two times a day (BID) | ORAL | Status: DC
  Administered 2018-03-27 – 2018-04-04 (×17): 20 mg via ORAL
  Filled 2018-03-27 (×17): qty 1

## 2018-03-27 MED ORDER — BISACODYL 5 MG PO TBEC
5.0000 mg | DELAYED_RELEASE_TABLET | Freq: Every day | ORAL | Status: DC | PRN
  Administered 2018-03-29 – 2018-04-01 (×2): 5 mg via ORAL
  Filled 2018-03-27 (×2): qty 1

## 2018-03-27 MED ORDER — VITAMIN D 25 MCG (1000 UNIT) PO TABS
1000.0000 [IU] | ORAL_TABLET | Freq: Every day | ORAL | Status: DC
  Administered 2018-03-27 – 2018-04-04 (×9): 1000 [IU] via ORAL
  Filled 2018-03-27 (×12): qty 1

## 2018-03-27 MED ORDER — SENNOSIDES-DOCUSATE SODIUM 8.6-50 MG PO TABS: 1.0000 | ORAL_TABLET | Freq: Every evening | ORAL | Status: DC | PRN

## 2018-03-27 MED ORDER — ALBUTEROL SULFATE (2.5 MG/3ML) 0.083% IN NEBU: 2.5000 mg | INHALATION_SOLUTION | Freq: Four times a day (QID) | RESPIRATORY_TRACT | Status: DC | PRN

## 2018-03-27 MED ORDER — ACETAMINOPHEN 650 MG RE SUPP: 650.0000 mg | Freq: Four times a day (QID) | RECTAL | Status: DC | PRN

## 2018-03-27 MED ORDER — DIVALPROEX SODIUM 500 MG PO DR TAB
500.0000 mg | DELAYED_RELEASE_TABLET | Freq: Three times a day (TID) | ORAL | Status: DC
  Administered 2018-03-27 – 2018-04-04 (×25): 500 mg via ORAL
  Filled 2018-03-27 (×27): qty 1

## 2018-03-27 MED ORDER — CARVEDILOL 12.5 MG PO TABS
12.5000 mg | ORAL_TABLET | Freq: Two times a day (BID) | ORAL | Status: DC
  Administered 2018-03-27 – 2018-04-04 (×17): 12.5 mg via ORAL
  Filled 2018-03-27 (×9): qty 1
  Filled 2018-03-27: qty 2
  Filled 2018-03-27 (×7): qty 1

## 2018-03-27 MED ORDER — ATORVASTATIN CALCIUM 20 MG PO TABS
20.0000 mg | ORAL_TABLET | Freq: Every day | ORAL | Status: DC
  Administered 2018-03-27 – 2018-04-03 (×8): 20 mg via ORAL
  Filled 2018-03-27 (×9): qty 1

## 2018-03-27 MED ORDER — NYSTATIN 100000 UNIT/GM EX CREA
TOPICAL_CREAM | Freq: Two times a day (BID) | CUTANEOUS | Status: DC
  Administered 2018-03-27 – 2018-04-04 (×17): via TOPICAL
  Filled 2018-03-27 (×2): qty 15

## 2018-03-27 MED ORDER — FLUPHENAZINE HCL 5 MG/ML PO CONC
5.0000 mg | Freq: Two times a day (BID) | ORAL | Status: DC
  Administered 2018-03-28 – 2018-04-04 (×14): 5 mg via ORAL
  Filled 2018-03-27 (×21): qty 1

## 2018-03-27 MED ORDER — LOSARTAN POTASSIUM 25 MG PO TABS
25.0000 mg | ORAL_TABLET | Freq: Every day | ORAL | Status: DC
  Administered 2018-03-27 – 2018-04-04 (×9): 25 mg via ORAL
  Filled 2018-03-27 (×9): qty 1

## 2018-03-27 MED ORDER — FUROSEMIDE 40 MG PO TABS
80.0000 mg | ORAL_TABLET | Freq: Every day | ORAL | Status: DC
  Administered 2018-03-27 – 2018-03-28 (×2): 80 mg via ORAL
  Filled 2018-03-27 (×3): qty 2

## 2018-03-27 MED ORDER — LACTATED RINGERS IV BOLUS
1000.0000 mL | Freq: Once | INTRAVENOUS | Status: AC
  Administered 2018-03-27: 1000 mL via INTRAVENOUS

## 2018-03-27 MED ORDER — BENZTROPINE MESYLATE 0.5 MG PO TABS
0.5000 mg | ORAL_TABLET | Freq: Two times a day (BID) | ORAL | Status: DC
  Administered 2018-03-27 – 2018-04-04 (×16): 0.5 mg via ORAL
  Filled 2018-03-27 (×18): qty 1

## 2018-03-27 MED ORDER — ONDANSETRON HCL 4 MG PO TABS: 4.0000 mg | ORAL_TABLET | Freq: Four times a day (QID) | ORAL | Status: DC | PRN

## 2018-03-27 MED ORDER — LEVOTHYROXINE SODIUM 50 MCG PO TABS
75.0000 ug | ORAL_TABLET | Freq: Every day | ORAL | Status: DC
  Administered 2018-03-27 – 2018-04-04 (×8): 75 ug via ORAL
  Filled 2018-03-27 (×5): qty 2
  Filled 2018-03-27: qty 1
  Filled 2018-03-27: qty 2
  Filled 2018-03-27 (×2): qty 1
  Filled 2018-03-27: qty 2

## 2018-03-27 MED ORDER — INSULIN ASPART 100 UNIT/ML ~~LOC~~ SOLN
0.0000 [IU] | Freq: Every day | SUBCUTANEOUS | Status: DC
  Filled 2018-03-27: qty 1

## 2018-03-27 NOTE — ED Notes (Signed)
Pt repositioned in bed at this time

## 2018-03-27 NOTE — ED Notes (Signed)
CRITICAL LAB: TROPONIN is 0.05, [facility], Dr. Beather Arbour notified, orders received

## 2018-03-27 NOTE — Progress Notes (Signed)
Admitted this morning for leg weakness, found to have hypokalemia, dehydration, slightly elevated troponins.  Labs reviewed 1.  Acute kidney injury ; improved with fluids.  hypokalemialso improved with replacement.  2.  Slightly elevated troponins likely demand ischemia.  Continue aspirin 3.  Decubitus ulcers, seen by wound care nurse, continue there precautions and directions further care. 4.  History of essential hypertension 5.  Diabetes mellitus type: N.p.o., hold Lantus until patient p.o. intake is established.  2: Alzheimer's dementia: N.p.o., speech therapy consult pending because of dementia, possible aspiration precautions, patient does not talk much.  Patient is on Cogentin, Depakote, Prolixin. 6.  Hyperlipidemia 7.  Obstructive sleep apnea 7.  Chronic diastolic heart failure: Patient is on Lasix. CODE STATUS DNR.

## 2018-03-27 NOTE — Consult Note (Addendum)
[place] Nurse wound consult note Reason for Consult:Moisture associated skin damage to buttocks and gluteal fold from prolonged exposure to urinary and fecal incontinence.  Wound type:MASD Pressure Injury POA: NA High risk for pressure injury due to limited mobility.  Measurement:Scattered nonintact lesions and erythema to buttocks and gluteal fold.  Wound CYE:LYHT Drainage (amount, consistency, odor) none Periwound:intact Dressing procedure/placement/frequency: Cleanse buttocks and perineal skin with soap and water and pat gently dry.  Apply barrier cream.  Noted allergy to prednisone will avoid Gerhardts. Turn and reposition every two hours. Avoid use of disposable briefs and underpads while in bed.    Will not follow at this time.  Please re-consult if needed.  Domenic Moras MSN, RN, FNP-BC CWON Wound, Ostomy, Continence Nurse Pager [phone]

## 2018-03-27 NOTE — Progress Notes (Signed)
Pt c/o left arm pain. MD Posey Pronto made aware. An x-ray of the left humerus and forearm was ordered as instructed by MD.

## 2018-03-27 NOTE — ED Notes (Signed)
Pt reports "being sick" at home, not eating d/t SOB, pain in legs and buttocks, reports compliance with meds but poor historian and unable to give any specifics or answer direct questions  +1 pitting to lower extremities, no resp distress noted

## 2018-03-27 NOTE — ED Notes (Signed)
ED TO INPATIENT HANDOFF REPORT  Name/Age/Gender [name] [name] 73 y.o. female  Code Status Code Status History    Date Active Date Inactive Code Status Order ID Comments User Context   02/14/2018 1703 02/16/2018 1706 Full Code [id number]  Sela Hua, MD Inpatient   11/05/2017 0516 11/08/2017 1929 Full Code [id number]  Arta Silence, MD Inpatient   08/03/2017 1625 08/16/2017 1419 Full Code [id number]  Gladstone Lighter, MD Inpatient   07/02/2016 1403 07/03/2016 2031 Full Code [id number]  Charlesetta Shanks, MD ED   01/16/2016 0346 01/21/2016 1921 DNR [id number]  Lily Kocher, MD Inpatient   10/14/2015 1249 10/22/2015 2022 DNR [id number]  Laverle Hobby, MD Inpatient   10/13/2015 0201 10/14/2015 1249 Full Code [id number]  Mikael Spray, NP ED   10/29/2014 2043 11/05/2014 2003 Full Code [id number]  Gonzella Lex, MD Inpatient   2020/05/1314 1936 08/12/2014 1635 Full Code [id number]  Sheryle Spray, RN ED   06/29/2014 1523 07/04/2014 0050 Full Code [id number]  Gladstone Lighter, MD Inpatient   06/24/2011 1832 07/01/2011 0002 Full Code [id number]  Kathie Dike, MD Inpatient      Home/SNF/Other Home  Chief Complaint leg pain, weakness  Level of Care/Admitting Diagnosis ED Disposition    ED Disposition Condition [facility] Area: [place] [100120]  Level of Care: Telemetry [5]  Diagnosis: Elevated troponin [992426]  Admitting Physician: Arta Silence [[id number]]  Attending Physician: Arta Silence [[id number]]  Estimated length of stay: past midnight tomorrow  Certification:: I certify this patient will need inpatient services for at least 2 midnights  PT Class (Do Not Modify): Inpatient [101]  PT Acc Code (Do Not Modify): Private [1]       Medical History Past Medical History:  Diagnosis Date  . Anxiety   . Arthritis   . Bronchitis   . Bursitis   . CAD (coronary artery disease)   . CHF (congestive heart failure) ([place])   .  Chronic cough   . Chronic kidney disease    shadow on x-ray  . COPD (chronic obstructive pulmonary disease) ([place])   . Depression   . DM2 (diabetes mellitus, type 2) ([place])   . Environmental allergies   . GERD (gastroesophageal reflux disease)   . Hypercholesteremia   . Hypertension   . Left ventricular outflow tract obstruction    a. echo 03/2014: EF 60-65%, hypernamic LV systolic fxn, mod LVH w/ LVOT gradient estimated at 68 mm Hg w/ valsalva, very small LV internal cavity size, mildly increased LV posterior wall thickness, mild Ao valve scl w/o stenosis, diastolic dysfunction, normal RVSP  . Lower extremity edema   . LVH (left ventricular hypertrophy)    a. echo suggests long standing uncontrolled htn. she will not do well when dehydrated, LV cavity obliteration  . Muscle weakness   . Obesity   . On supplemental oxygen therapy    AS NEEDED  . OSA (obstructive sleep apnea)    does not use machine  . Osteoarthritis   . Schizophrenia ([place])   . Tremors of nervous system   . Wheezing     Allergies Allergies  Allergen Reactions  . Haldol [Haloperidol Decanoate] Swelling and Other (See Comments)    Reaction:  Swelling of tongue and blurred vision    . Prednisone Other (See Comments)    Unknown reaction  . Raspberry Swelling and Other (See Comments)    Reaction:  Swelling of lips   . Metformin Diarrhea    IV Location/Drains/Wounds  Patient Lines/Drains/Airways Status   Active Line/Drains/Airways    Name:   Placement date:   Placement time:   Site:   Days:   Peripheral IV 03/26/18 Left;Upper Arm   03/26/18    2240    Arm   1          Labs/Imaging Results for orders placed or performed during the hospital encounter of 03/26/18 (from the past 48 hour(s))  Basic metabolic panel     Status: Abnormal   Collection Time: 03/26/18 10:41 PM  Result Value Ref Range   Sodium 142 135 - 145 mmol/L   Potassium 3.3 (L) 3.5 - 5.1 mmol/L   Chloride 94 (L) 98 - 111 mmol/L   CO2 40 (H) 22  - 32 mmol/L   Glucose, Bld 107 (H) 70 - 99 mg/dL   BUN 16 8 - 23 mg/dL   Creatinine, Ser 1.09 (H) 0.44 - 1.00 mg/dL   Calcium 9.5 8.9 - 10.3 mg/dL   GFR calc non Af Amer 50 (L) >60 mL/min   GFR calc Af Amer 58 (L) >60 mL/min   Anion gap 8 5 - 15    Comment: Performed at [facility], [address] [place], [place] [address]  CBC     Status: None   Collection Time: 03/26/18 10:41 PM  Result Value Ref Range   WBC 9.7 4.0 - 10.5 K/uL   RBC 4.34 3.87 - 5.11 MIL/uL   Hemoglobin 12.8 12.0 - 15.0 g/dL   HCT 41.0 36.0 - 46.0 %   MCV 94.5 80.0 - 100.0 fL   MCH 29.5 26.0 - 34.0 pg   MCHC 31.2 30.0 - 36.0 g/dL   RDW 13.8 11.5 - 15.5 %   Platelets 201 150 - 400 K/uL   nRBC 0.0 0.0 - 0.2 %    Comment: Performed at [facility], [address] [place], [place] [address]  Urinalysis, Complete w Microscopic     Status: Abnormal   Collection Time: 03/26/18 10:41 PM  Result Value Ref Range   Color, Urine YELLOW (A) YELLOW   APPearance CLEAR (A) CLEAR   Specific Gravity, Urine 1.014 1.005 - 1.030   pH 5.0 5.0 - 8.0   Glucose, UA NEGATIVE NEGATIVE mg/dL   Hgb urine dipstick SMALL (A) NEGATIVE   Bilirubin Urine NEGATIVE NEGATIVE   Ketones, ur NEGATIVE NEGATIVE mg/dL   Protein, ur NEGATIVE NEGATIVE mg/dL   Nitrite NEGATIVE NEGATIVE   Leukocytes, UA NEGATIVE NEGATIVE   RBC / HPF 0-5 0 - 5 RBC/hpf   WBC, UA 0-5 0 - 5 WBC/hpf   Bacteria, UA NONE SEEN NONE SEEN   Squamous Epithelial / LPF 0-5 0 - 5   Mucus PRESENT    Hyaline Casts, UA PRESENT     Comment: Performed at [facility], [address] [place], [place] [address]  Hepatic function panel     Status: None   Collection Time: 03/26/18 10:41 PM  Result Value Ref Range   Total Protein 7.8 6.5 - 8.1 g/dL   Albumin 3.5 3.5 - 5.0 g/dL   AST 24 15 - 41 U/L   ALT 14 0 - 44 U/L   Alkaline Phosphatase 47 38 - 126 U/L   Total Bilirubin 0.6 0.3 - 1.2 mg/dL   Bilirubin, Direct 0.1 0.0 - 0.2 mg/dL   Indirect  Bilirubin 0.5 0.3 - 0.9 mg/dL    Comment: Performed at [facility], [address], [place], [place] [address]  Troponin I - Add-On to previous collection     Status: Abnormal   Collection Time: 03/26/18 10:41 PM  Result Value Ref Range   Troponin I 0.05 (HH) <0.03 ng/mL    Comment: CRITICAL RESULT CALLED TO, READ BACK BY AND VERIFIED WITH Gwynn Crossley WEBSTER AT 0028 03/27/2018.  TFK Performed at [facility], [address], [place], [place] [address]    Dg Chest Port 1 View  Result Date: 03/26/2018 CLINICAL DATA:  Increasing we had dizziness for 1 week.  Leg pain. EXAM: PORTABLE CHEST 1 VIEW COMPARISON:  Chest radiograph November 04, 2017 FINDINGS: Cardiac silhouette is mildly enlarged, decreased from prior examination. Granuloma versus pulmonary vessel en face projecting RIGHT heart. Mediastinal silhouette is not suspicious. No pleural effusion or focal consolidation no pneumothorax. Soft tissue planes and included osseous structures are non suspicious. IMPRESSION: 1. Mild cardiomegaly.  No acute pulmonary process. Electronically Signed   By: Elon Alas M.D.   On: 03/26/2018 23:41    Pending Labs Unresulted Labs (From admission, onward)    Start     Ordered   03/27/18 0700  Troponin I - Once  Once,   STAT     03/27/18 0045   03/27/18 0300  Troponin I - Once  Once,   STAT     03/27/18 0045   03/27/18 0049  Magnesium  Add-on,   AD    Question:  Specimen collection method  Answer:  Unit=Unit collect   03/27/18 0048   03/27/18 0049  Phosphorus  Add-on,   AD    Question:  Specimen collection method  Answer:  Unit=Unit collect   03/27/18 0048   03/27/18 0049  Calcium, ionized  Once,   STAT    Question:  Specimen collection method  Answer:  Unit=Unit collect   03/27/18 0048   03/27/18 0049  Prealbumin  Add-on,   AD    Question:  Specimen collection method  Answer:  Unit=Unit collect   03/27/18 0048   Signed and Held  Basic metabolic panel  Tomorrow morning,   R      Signed and Held          Vitals/Pain Today's Vitals   03/26/18 2228 03/26/18 2340 03/27/18 0126 03/27/18 0131  BP:    (!) 167/88  Pulse:    87  Resp:    16  Temp:      TempSrc:      SpO2:    99%  Weight: 124.7 kg     Height: 5\' 6"  (1.676 m)     PainSc: 6  5  5  5      Isolation Precautions No active isolations  Medications Medications  aspirin chewable tablet 81 mg (has no administration in time range)  nitroGLYCERIN (NITROSTAT) SL tablet 0.4 mg (has no administration in time range)  morphine 2 MG/ML injection 2 mg (has no administration in time range)    Or  morphine 4 MG/ML injection 4 mg (has no administration in time range)  lactated ringers bolus 1,000 mL (1,000 mLs Intravenous New Bag/Given 03/27/18 0128)  aspirin chewable tablet 324 mg (324 mg Oral Given 03/27/18 0127)  potassium chloride SA (K-DUR,KLOR-CON) CR tablet 40 mEq (40 mEq Oral Given 03/27/18 0127)    Mobility non-ambulatory

## 2018-03-27 NOTE — Progress Notes (Signed)
Patient able to eat 90% of her dinner tray with no issues swallowing, she is weak and needs assistance with feeding and drinking at this time.

## 2018-03-27 NOTE — ED Notes (Signed)
Received call from pharmacy that pt's Prolixin has not been sent today d/t it is not available at [facility] and pharmacy is waiting on shipment from [facility] ETA 1800. Pharmacy states they will adjust med due times when delivery arrives.

## 2018-03-27 NOTE — ED Notes (Signed)
Per charge, tele full, hold pt

## 2018-03-27 NOTE — ED Notes (Signed)
Pt ate 1 cup of applesauce and 2 grape juices.

## 2018-03-27 NOTE — H&P (Addendum)
[name] at [place] NAME: [name] [name]    MR#:  [id number]  DATE OF BIRTH:  [birth year]  DATE OF ADMISSION:  03/26/2018  PRIMARY CARE PHYSICIAN: [name]   REQUESTING/REFERRING PHYSICIAN: Paulette Blanch, MD  CHIEF COMPLAINT:   Chief Complaint  Patient presents with  . Weakness  . Rectal Pain  . Leg Pain    HISTORY OF PRESENT ILLNESS:  [name] [name]  is a 73 y.o. female with a known history of morbid obesity, T2IDDM, HTN, HLD, COPD, OSA, chronic diastolic CHF (EF > 66%, grade 1 diastolic dysfxn as of 08/22/1599 Echo), psychosis p/w generalized weakness. I am told her son was with her on arrival to ED; he is no longer present at the time of my assessment. Pt is unable to provide any Hx/ROS, other than to tell me she is hungry and thirsty. She appears weak. Her mouth is dry. She has stage 1 decubitus ulcers on her buttocks. Trop-I 0.05. I suspect her son is unable to care for her adequately at home, owing to her habitus and litany of medical issues; she would likely benefit from long-term care/SNF placement.  PAST MEDICAL HISTORY:   Past Medical History:  Diagnosis Date  . Anxiety   . Arthritis   . Bronchitis   . Bursitis   . CAD (coronary artery disease)   . CHF (congestive heart failure) ([place])   . Chronic cough   . Chronic kidney disease    shadow on x-ray  . COPD (chronic obstructive pulmonary disease) ([place])   . Depression   . DM2 (diabetes mellitus, type 2) ([place])   . Environmental allergies   . GERD (gastroesophageal reflux disease)   . Hypercholesteremia   . Hypertension   . Left ventricular outflow tract obstruction    a. echo 03/2014: EF 60-65%, hypernamic LV systolic fxn, mod LVH w/ LVOT gradient estimated at 68 mm Hg w/ valsalva, very small LV internal cavity size, mildly increased LV posterior wall thickness, mild Ao valve scl w/o stenosis, diastolic dysfunction, normal RVSP  . Lower extremity edema   . LVH  (left ventricular hypertrophy)    a. echo suggests long standing uncontrolled htn. she will not do well when dehydrated, LV cavity obliteration  . Muscle weakness   . Obesity   . On supplemental oxygen therapy    AS NEEDED  . OSA (obstructive sleep apnea)    does not use machine  . Osteoarthritis   . Schizophrenia ([place])   . Tremors of nervous system   . Wheezing     PAST SURGICAL HISTORY:   Past Surgical History:  Procedure Laterality Date  . CATARACT EXTRACTION W/PHACO Left 09/10/2014   Procedure: CATARACT EXTRACTION PHACO AND INTRAOCULAR LENS PLACEMENT (IOC);  Surgeon: Birder Robson, MD;  Location: [facility] ORS;  Service: Ophthalmology;  Laterality: Left;  [place]: 00:44 AP%: 22.8 CDE: 10.24 Fluid lot #[id number]  . CATARACT EXTRACTION W/PHACO Right 10/15/2014   Procedure: CATARACT EXTRACTION PHACO AND INTRAOCULAR LENS PLACEMENT (IOC);  Surgeon: Birder Robson, MD;  Location: [facility] ORS;  Service: Ophthalmology;  Laterality: Right;  [place]: 00:52 AP:40.1 CDE:11.83 LOT PACK #[id number]  . CHOLECYSTECTOMY    . COLONOSCOPY  04/2011   [facility] per patient incomplete  . EYE SURGERY    . JOINT REPLACEMENT     TKR  . KNEE RECONSTRUCTION, MEDIAL PATELLAR FEMORAL LIGAMENT    . RENAL BIOPSY    . TONSILLECTOMY    . TONSILLECTOMY  SOCIAL HISTORY:   Social History   Tobacco Use  . Smoking status: Former Smoker    Packs/day: 3.00    Years: 40.00    Pack years: 120.00  . Smokeless tobacco: Never Used  . Tobacco comment: quit in 2009  Substance Use Topics  . Alcohol use: No    Comment: occ.    FAMILY HISTORY:   Family History  Problem Relation Age of Onset  . Heart attack Mother   . Colon cancer Neg Hx   . Liver disease Neg Hx     DRUG ALLERGIES:   Allergies  Allergen Reactions  . Haldol [Haloperidol Decanoate] Swelling and Other (See Comments)    Reaction:  Swelling of tongue and blurred vision    . Prednisone Other (See Comments)    Unknown reaction  . Raspberry Swelling  and Other (See Comments)    Reaction:  Swelling of lips   . Metformin Diarrhea    REVIEW OF SYSTEMS:   Review of Systems  Unable to perform ROS: Dementia  Constitutional: Positive for malaise/fatigue.  Neurological: Positive for weakness.   MEDICATIONS AT HOME:   Prior to Admission medications   Medication Sig Start Date End Date Taking? Authorizing Provider  acetaminophen (TYLENOL) 325 MG tablet Take 2 tablets (650 mg total) by mouth every 4 (four) hours as needed for mild pain (temp > 101.5). 10/20/15  Yes Gladstone Lighter, MD  albuterol (PROVENTIL HFA;VENTOLIN HFA) 108 (90 Base) MCG/ACT inhaler Inhale 2 puffs into the lungs every 6 (six) hours as needed for wheezing or shortness of breath.   Yes [provider]  aspirin EC 81 MG tablet Take 81 mg by mouth daily.   Yes [provider]  atorvastatin (LIPITOR) 20 MG tablet Take 20 mg by mouth at bedtime.   Yes [provider]  benztropine (COGENTIN) 0.5 MG tablet Take 0.5 mg by mouth 2 (two) times daily.   Yes [provider]  carvedilol (COREG) 12.5 MG tablet Take 1 tablet (12.5 mg total) by mouth 2 (two) times daily. 11/08/17  Yes Salary, [name], MD  cetirizine (ZYRTEC) 10 MG tablet Take 10 mg by mouth daily.   Yes [provider]  cholecalciferol (VITAMIN D) 1000 units tablet Take 1,000 Units by mouth daily.   Yes [provider]  divalproex (DEPAKOTE) 500 MG DR tablet Take 1 tablet (500 mg total) by mouth 3 (three) times daily. 08/15/17  Yes Wieting, Richard, MD  docusate sodium (COLACE) 100 MG capsule Take 100 mg by mouth 2 (two) times daily.    Yes [provider]  famotidine (PEPCID) 20 MG tablet Take 20 mg by mouth 2 (two) times daily.   Yes [provider]  fluPHENAZine (PROLIXIN) 5 MG/ML solution Take 5 mg by mouth 2 (two) times daily.    Yes [provider]  Fluticasone-Salmeterol (ADVAIR) 250-50 MCG/DOSE AEPB Inhale 1 puff into the lungs 2 (two)  times daily.   Yes [provider]  furosemide (LASIX) 20 MG tablet Take 4 tablets (80 mg total) by mouth daily. 02/16/18  Yes Mayo, Pete Pelt, MD  insulin aspart (NOVOLOG) 100 UNIT/ML injection Inject 4 Units into the skin 4 (four) times daily. 02/16/18  Yes Mayo, Pete Pelt, MD  insulin glargine (LANTUS) 100 UNIT/ML injection Inject 11 Units into the skin at bedtime.    Yes [provider]  levothyroxine (SYNTHROID, LEVOTHROID) 75 MCG tablet Take 1 tablet by mouth daily. 04/05/17  Yes [provider]  losartan (COZAAR)  25 MG tablet Take 25 mg by mouth daily.   Yes [provider]  Melatonin (SM MELATONIN) 3 MG TABS Take 3 mg by mouth at bedtime as needed (sleep).   Yes [provider]  nitroGLYCERIN (NITROSTAT) 0.4 MG SL tablet Place 0.4 mg under the tongue every 5 (five) minutes as needed for chest pain.   Yes [provider]  sitaGLIPtin (JANUVIA) 50 MG tablet Take 50 mg by mouth daily.   Yes [provider]  umeclidinium bromide (INCRUSE ELLIPTA) 62.5 MCG/INH AEPB Inhale 1 puff into the lungs daily.   Yes [provider]      VITAL SIGNS:  Blood pressure (!) 167/88, pulse 87, temperature 98 F (36.7 C), temperature source Oral, resp. rate 16, height 5\' 6"  (1.676 m), weight 124.7 kg, SpO2 99 %.  PHYSICAL EXAMINATION:  Physical Exam Constitutional:      General: She is awake. She is not in acute distress.    Appearance: She is morbidly obese. She is ill-appearing. She is not toxic-appearing or diaphoretic.     Interventions: She is not intubated. HENT:     Head: Atraumatic.     Mouth/Throat:     Mouth: Mucous membranes are dry.     Pharynx: Oropharynx is clear.  Eyes:     General: Lids are normal. No scleral icterus.    Extraocular Movements: Extraocular movements intact.     Conjunctiva/sclera: Conjunctivae normal.  Neck:     Musculoskeletal: Neck supple.  Cardiovascular:     Rate and Rhythm: Normal rate and  regular rhythm.     Heart sounds: Normal heart sounds, S1 normal and S2 normal. Heart sounds not distant. No murmur. No friction rub. No gallop. No S3 or S4 sounds.   Pulmonary:     Effort: No tachypnea, bradypnea, accessory muscle usage, prolonged expiration, respiratory distress or retractions. She is not intubated.     Breath sounds: No stridor. Examination of the right-upper field reveals decreased breath sounds. Examination of the left-upper field reveals decreased breath sounds. Examination of the right-middle field reveals decreased breath sounds. Examination of the left-middle field reveals decreased breath sounds. Examination of the right-lower field reveals decreased breath sounds. Examination of the left-lower field reveals decreased breath sounds. Decreased breath sounds present. No wheezing, rhonchi or rales.  Abdominal:     General: Bowel sounds are decreased. There is no distension.     Palpations: Abdomen is soft.     Tenderness: There is no abdominal tenderness. There is no guarding or rebound.  Musculoskeletal: Normal range of motion.        General: No swelling or tenderness.     Right lower leg: No edema.     Left lower leg: No edema.  Lymphadenopathy:     Cervical: No cervical adenopathy.  Skin:    General: Skin is warm and dry.     Findings: No erythema or rash.  Neurological:     Mental Status: She is alert. She is disoriented.     Comments: Dementia, disoriented.  Psychiatric:        Attention and Perception: Attention normal.        Speech: Speech is delayed.        Behavior: Behavior is slowed. Behavior is cooperative.        Cognition and Memory: Cognition is impaired. Memory is impaired. She exhibits impaired recent memory and impaired remote memory.     Comments: Dementia, disoriented.     LABORATORY PANEL:  CBC Recent Labs  Lab 03/26/18 2241  WBC 9.7  HGB 12.8  HCT 41.0  PLT 201    ------------------------------------------------------------------------------------------------------------------  Chemistries  Recent Labs  Lab 03/26/18 2241  NA 142  K 3.3*  CL 94*  CO2 40*  GLUCOSE 107*  BUN 16  CREATININE 1.09*  CALCIUM 9.5  AST 24  ALT 14  ALKPHOS 47  BILITOT 0.6   ------------------------------------------------------------------------------------------------------------------  Cardiac Enzymes Recent Labs  Lab 03/26/18 2241  TROPONINI 0.05*   ------------------------------------------------------------------------------------------------------------------  RADIOLOGY:  Dg Chest Port 1 View  Result Date: 03/26/2018 CLINICAL DATA:  Increasing we had dizziness for 1 week.  Leg pain. EXAM: PORTABLE CHEST 1 VIEW COMPARISON:  Chest radiograph November 04, 2017 FINDINGS: Cardiac silhouette is mildly enlarged, decreased from prior examination. Granuloma versus pulmonary vessel en face projecting RIGHT heart. Mediastinal silhouette is not suspicious. No pleural effusion or focal consolidation no pneumothorax. Soft tissue planes and included osseous structures are non suspicious. IMPRESSION: 1. Mild cardiomegaly.  No acute pulmonary process. Electronically Signed   By: Elon Alas M.D.   On: 03/26/2018 23:41   IMPRESSION AND PLAN:   A/P: 43F w/ PMHx morbid obesity, T2IDDM, HTN, HLD, COPD, OSA, chronic diastolic CHF (EF > 60%, grade 1 diastolic dysfxn as of 10/93/2355 Echo), psychosis p/w generalized weakness. Decubitus ulcers. Hypokalemia, hypochloremia, dehydration, hyperglycemia (w/ T2IDDM), Cr elevation/CKD III, Troponin elevation. -Generalized weakness, hypokalemia, hypochloremia, dehydration, Cr elevation/CKD III, care deficit: Appears deconditioned and dehydrated. Cl- 94, volume contraction. Cr 1.09, at baseline (1.0-1.2). Underlying CKD III (2/2 DM, HTN, aged kidney). IVF. Replete K+. Mag level pending. Monitor BMP, avoid nephrotoxins. P/T evaluation  once improving. -Troponin elevation: Trop-I 0.05, rpt pending. ASA. -Decubitus ulcers: Foam wedge, turn pt q2h, wound nurse consult. -c/w other home meds/formulary subs as tolerated. -FEN/GI: NPO pending swallow, cardiac diet if passed. -DVT PPx: Lovenox. -Code status: DNR/DNI. -Disposition: Admission, > 2 midnights. I suspect her son is unable to care for her adequately at home, owing to her habitus and litany of medical issues; she would likely benefit from long-term care/SNF placement.   All the records are reviewed and case discussed with ED provider. Management plans discussed with the patient, family and they are in agreement.  CODE STATUS: DNR/DNI.  TOTAL TIME TAKING CARE OF THIS PATIENT: 75 minutes.    Arta Silence M.D on 03/27/2018 at 1:46 AM  Between 7am to 6pm - Pager - [phone]  After 6pm go to [email or web address] - Proofreader  [facility]  Office  [phone]  CC: Primary care physician; [name]   Note: This dictation was prepared with Dragon dictation along with smaller phrase technology. Any transcriptional errors that result from this process are unintentional.

## 2018-03-27 NOTE — Progress Notes (Signed)
Pt is unable to answer assessment questions at this time, I will  Not be able to complete profile documentation.

## 2018-03-27 NOTE — ED Notes (Signed)
Pt requesting benadryl for pain

## 2018-03-28 LAB — GLUCOSE, CAPILLARY
Glucose-Capillary: 127 mg/dL — ABNORMAL HIGH (ref 70–99)
Glucose-Capillary: 94 mg/dL (ref 70–99)
Glucose-Capillary: 179 mg/dL — ABNORMAL HIGH (ref 70–99)
Glucose-Capillary: 136 mg/dL — ABNORMAL HIGH (ref 70–99)
Glucose-Capillary: 117 mg/dL — ABNORMAL HIGH (ref 70–99)

## 2018-03-28 LAB — CALCIUM, IONIZED: Calcium, Ionized, Serum: 5 mg/dL (ref 4.5–5.6)

## 2018-03-28 MED ORDER — OXYCODONE HCL 5 MG PO TABS
5.0000 mg | ORAL_TABLET | Freq: Four times a day (QID) | ORAL | Status: DC | PRN
  Administered 2018-03-29 – 2018-04-04 (×5): 5 mg via ORAL
  Filled 2018-03-28 (×6): qty 1

## 2018-03-28 NOTE — Progress Notes (Signed)
[facility] NAME: [name] [name]    MR#:  [id number]  DATE OF BIRTH:  [birth year]  SUBJECTIVE: Admitted for multiple nonspecific complaints with generalized weakness, poor p.o. intake as per history and physical.  Patient son dropped her off, admitted for the same, patient is a poor historian unable to tell any complaints except she had right shoulder pain for which she had x-ray of the shoulder showing degeneration but negative for fracture.  Admitted to telemetry because of mildly elevated troponin of 0.05.  CHIEF COMPLAINT:   Chief Complaint  Patient presents with  . Weakness  . Rectal Pain  . Leg Pain    REVIEW OF SYSTEMS:   Unable to obtain review of systems because of dementia.  DRUG ALLERGIES:   Allergies  Allergen Reactions  . Haldol [Haloperidol Decanoate] Swelling and Other (See Comments)    Reaction:  Swelling of tongue and blurred vision    . Prednisone Other (See Comments)    Unknown reaction  . Raspberry Swelling and Other (See Comments)    Reaction:  Swelling of lips   . Metformin Diarrhea    VITALS:  Blood pressure 122/69, pulse 61, temperature 97.6 F (36.4 C), temperature source Oral, resp. rate 18, height 5\' 6"  (1.676 m), weight 124.7 kg, SpO2 100 %.  PHYSICAL EXAMINATION:  GENERAL:  73 y.o.-year-old patient lying in the bed with no acute distress.  Patient very dry. EYES: Pupils equal, round, reactive to light . No scleral icterus. Extraocular muscles intact.  HEENT: Head atraumatic, normocephalic. Oropharynx and nasopharynx clear.  NECK:  Supple, no jugular venous distention. No thyroid enlargement, no tenderness.  LUNGS: Normal breath sounds bilaterally, no wheezing, rales,rhonchi or crepitation. No use of accessory muscles of respiration.  CARDIOVASCULAR: S1, S2 normal. No murmurs, rubs, or gallops.  ABDOMEN: Soft, nontender, nondistended. Bowel sounds present. No organomegaly or mass.   EXTREMITIES: No pedal edema, cyanosis, or clubbing.  NEUROLOGIC: Has advanced Alzheimer's dementia, unable to follow commands for full neuro exam but has no gross neurological deficit. PSYCHIATRIC: Slow to respond, told that she is at [facility] to the nurse this morning but she could not tell that.  saying that she is hurting but she could not tell me where she is hurting SKIN: No obvious rash, lesion, or ulcer.    LABORATORY PANEL:   CBC Recent Labs  Lab 03/26/18 2241  WBC 9.7  HGB 12.8  HCT 41.0  PLT 201   ------------------------------------------------------------------------------------------------------------------  Chemistries  Recent Labs  Lab 03/26/18 2241 03/27/18 0717  NA 142 140  K 3.3* 4.2  CL 94* 98  CO2 40* 39*  GLUCOSE 107* 116*  BUN 16 15  CREATININE 1.09* 0.99  CALCIUM 9.5 9.0  MG  --  1.8  AST 24  --   ALT 14  --   ALKPHOS 47  --   BILITOT 0.6  --    ------------------------------------------------------------------------------------------------------------------  Cardiac Enzymes Recent Labs  Lab 03/27/18 0717  TROPONINI <0.03   ------------------------------------------------------------------------------------------------------------------  RADIOLOGY:  Dg Chest Port 1 View  Result Date: 03/26/2018 CLINICAL DATA:  Increasing we had dizziness for 1 week.  Leg pain. EXAM: PORTABLE CHEST 1 VIEW COMPARISON:  Chest radiograph November 04, 2017 FINDINGS: Cardiac silhouette is mildly enlarged, decreased from prior examination. Granuloma versus pulmonary vessel en face projecting RIGHT heart. Mediastinal silhouette is not suspicious. No pleural effusion or focal consolidation no pneumothorax. Soft tissue planes and included osseous structures are non suspicious.  IMPRESSION: 1. Mild cardiomegaly.  No acute pulmonary process. Electronically Signed   By: Elon Alas M.D.   On: 03/26/2018 23:41   Dg Humerus Left  Result Date: 03/27/2018 CLINICAL  DATA:  Left arm pain. No known injury. EXAM: LEFT HUMERUS - 2+ VIEW COMPARISON:  04/24/2015. Left shoulder dated 01/20/2016 and 08/30/2015. FINDINGS: Artifacts overlying the elbow region on the frontal view. The frontal view is oblique in the elbows difficult to assess on that view. No gross abnormality of the elbow on the lateral view with no visible effusion. Moderate to marked inferior glenohumeral spur formation, similar to that seen on 04/24/2015, with mild spur formation involving the greater tuberosity. No humerus fracture or dislocation seen. IMPRESSION: 1. No visible humerus fracture or dislocation. 2. The elbow is suboptimally assessed. 3. Moderate to marked glenohumeral joint degenerative changes. Electronically Signed   By: Claudie Revering M.D.   On: 03/27/2018 21:02    EKG:   Orders placed or performed during the hospital encounter of 03/26/18  . ED EKG  . ED EKG  . EKG 12-Lead  . EKG 12-Lead    ASSESSMENT AND PLAN:   73 year old female with advanced Alzheimer's dementia, diabetes mellitus type 2, essential hypertension, decubitus ulcers brought in because of generalized weakness, poor p.o. intake. 1.  Acute kidney injury admission due to ATN and not eating, improved with IV fluids, continue IV fluids because patient if p.o. intake is still poor.  Consulted palliative care because of her advanced age, Alzheimer's dementia not sure her p.o. intake will improve. #2. diabetes mellitus type 2: Continue sliding scale with coverage, started on the diet. 3.  Alzheimer's dementia:, patient already on Depakote, Cogentin, Prolixin.  Continue them. 4.  Essential hypertension: Continue present medicines 5.  General elevated troponins likely demand ischemia, check echo, discontinue telemetry, transfer out of telemetry. #6 right renal mass, highly concerning for renal cell carcinoma, similar size and appearance from previous CAT scans, patient followed by [facility] nephrology, surveillance recommended due  to her comorbidities, high risk for surgeries, size and location not ideal for ablation as per documentation from [facility] oncology.  All the records are reviewed and case discussed with Care Management/Social Workerr. Management plans discussed with the patient, family and they are in agreement.  CODE STATUS: DNR  TOTAL TIME TAKING CARE OF THIS PATIENT: 38 minutes.  More than 50% of time spent in counseling, coordination of care, reviewing old charts in epic care everywhere. We will discharge in next 1 to 2 days depending on clinical condition Epifanio Lesches M.D on 03/28/2018 at 9:30 AM  Between 7am to 6pm - Pager - [phone]  After 6pm go to [email or web address] - password [email or web address]  Office  [phone]  CC: Primary care physician; [name]   Note: This dictation was prepared with Dragon dictation along with smaller phrase technology. Any transcriptional errors that result from this process are unintentional.

## 2018-03-29 ENCOUNTER — Encounter: Payer: Self-pay | Admitting: Primary Care

## 2018-03-29 DIAGNOSIS — Z515 Encounter for palliative care: Secondary | ICD-10-CM

## 2018-03-29 DIAGNOSIS — L89151 Pressure ulcer of sacral region, stage 1: Secondary | ICD-10-CM

## 2018-03-29 DIAGNOSIS — Z7189 Other specified counseling: Secondary | ICD-10-CM

## 2018-03-29 LAB — GLUCOSE, CAPILLARY
Glucose-Capillary: 115 mg/dL — ABNORMAL HIGH (ref 70–99)
Glucose-Capillary: 143 mg/dL — ABNORMAL HIGH (ref 70–99)
Glucose-Capillary: 119 mg/dL — ABNORMAL HIGH (ref 70–99)
Glucose-Capillary: 164 mg/dL — ABNORMAL HIGH (ref 70–99)

## 2018-03-29 LAB — URIC ACID: Uric Acid, Serum: 10.2 mg/dL — ABNORMAL HIGH (ref 2.5–7.1)

## 2018-03-29 MED ORDER — ORAL CARE MOUTH RINSE
15.0000 mL | Freq: Two times a day (BID) | OROMUCOSAL | Status: DC
  Administered 2018-03-29 – 2018-04-03 (×8): 15 mL via OROMUCOSAL

## 2018-03-29 MED ORDER — PREDNISONE 20 MG PO TABS: 20.0000 mg | ORAL_TABLET | Freq: Every day | ORAL | Status: DC

## 2018-03-29 MED ORDER — METHYLPREDNISOLONE SODIUM SUCC 125 MG IJ SOLR: 60.0000 mg | INTRAMUSCULAR | Status: DC

## 2018-03-29 MED ORDER — COLCHICINE 0.6 MG PO TABS
0.6000 mg | ORAL_TABLET | Freq: Every day | ORAL | Status: DC
  Administered 2018-03-29 – 2018-04-04 (×7): 0.6 mg via ORAL
  Filled 2018-03-29 (×7): qty 1

## 2018-03-29 MED ORDER — CHLORHEXIDINE GLUCONATE 0.12 % MT SOLN
15.0000 mL | Freq: Two times a day (BID) | OROMUCOSAL | Status: DC
  Administered 2018-03-29 – 2018-04-04 (×13): 15 mL via OROMUCOSAL
  Filled 2018-03-29 (×13): qty 15

## 2018-03-29 NOTE — Progress Notes (Signed)
PT Cancellation Note  Patient Details Name: [name] [name] MRN: [id number] DOB: 1945/10/30   Cancelled Treatment:    Reason Eval/Treat Not Completed: Fatigue/lethargy limiting ability to participate, evaluation attempted, unable to arouse patient to participate. Will return at later time/day as appropriate.     Juanmanuel Marohl 03/29/2018, 11:45 AM

## 2018-03-29 NOTE — Consult Note (Addendum)
Consultation Note Date: 03/29/2018   Patient Name: [name] [name]  DOB: 1945/11/29  MRN: [id number]  Age / Sex: 73 y.o., female  PCP: [name] Referring Physician: Epifanio Lesches, MD  Reason for Consultation: Establishing goals of care  HPI/Patient Profile: 73 y.o. female  with past medical history of heart failure with left ventricular hypertrophy, right renal mass similar size/appearance from previous CT concerning for RCC, COPD, anxiety and depression, high blood pressure and cholesterol, obesity, type 2 diabetes, CAD, obstructive sleep apnea, GERD, schizophrenia, diabetes, history of knee replacement, admitted on 03/26/2018 with acute kidney injury likely related to poor p.o. intake/dementia.  Palliative medicine team consulted for goals of care.   Clinical Assessment and Goals of Care: [name] [name] is resting quietly in bed.  She quickly opens her eyes making and somewhat keeping eye contact.  She is calm and cooperative, pleasant.  She has known dementia but is able to tell me her name, and after prompting that we are in [facility].  She is able to make her basic needs known.  There is no family at bedside at this time.  Call to son/legal guardian, [name] [name].  Left general voicemail message requesting return call.  PMT will continue to follow.   HC POA LEGAL GUARDIAN -chart lists son, [name] [name], as legal guardian.  There is no healthcare power of attorney paperwork/legal guardian paperwork and document list of epic.   SUMMARY OF RECOMMENDATIONS   Continue to treat the treatable Reach out to family for goals of care  Code Status/Advance Care Planning:  DNR  Symptom Management:   Per hospitalist, no additional needs at this time.  Palliative Prophylaxis:   Palliative Wound Care and Turn Reposition  Additional Recommendations (Limitations, Scope,  Preferences):  Treat the treatable but no extraordinary measures such as CPR or intubation.  Psycho-social/Spiritual:   Desire for further Chaplaincy support:no  Additional Recommendations: Caregiving  Support/Resources and Education on Hospice  Prognosis:   Unable to determine, based on outcomes.  Concerning prognosis based on 3 hospitalizations in 6 months, what appears to be poor functional status and decline.  Discharge Planning: To Be Determined      Primary Diagnoses: Present on Admission: . Elevated troponin   I have reviewed the medical record, interviewed the patient and family, and examined the patient. The following aspects are pertinent.  Past Medical History:  Diagnosis Date  . Anxiety   . Arthritis   . Bronchitis   . Bursitis   . CAD (coronary artery disease)   . CHF (congestive heart failure) ([place])   . Chronic cough   . Chronic kidney disease    shadow on x-ray  . COPD (chronic obstructive pulmonary disease) ([place])   . Depression   . DM2 (diabetes mellitus, type 2) ([place])   . Environmental allergies   . GERD (gastroesophageal reflux disease)   . Hypercholesteremia   . Hypertension   . Left ventricular outflow tract obstruction    a. echo 03/2014: EF 60-65%, hypernamic LV systolic fxn, mod  LVH w/ LVOT gradient estimated at 68 mm Hg w/ valsalva, very small LV internal cavity size, mildly increased LV posterior wall thickness, mild Ao valve scl w/o stenosis, diastolic dysfunction, normal RVSP  . Lower extremity edema   . LVH (left ventricular hypertrophy)    a. echo suggests long standing uncontrolled htn. she will not do well when dehydrated, LV cavity obliteration  . Muscle weakness   . Obesity   . On supplemental oxygen therapy    AS NEEDED  . OSA (obstructive sleep apnea)    does not use machine  . Osteoarthritis   . Schizophrenia ([place])   . Tremors of nervous system   . Wheezing    Social History   Socioeconomic History  . Marital status:  Widowed    Spouse name: Not on file  . Number of children: 2  . Years of education: Not on file  . Highest education level: Not on file  Occupational History  . Not on file  Social Needs  . Financial resource strain: Patient refused  . Food insecurity:    Worry: Patient refused    Inability: Patient refused  . Transportation needs:    Medical: Patient refused    Non-medical: Patient refused  Tobacco Use  . Smoking status: Former Smoker    Packs/day: 3.00    Years: 40.00    Pack years: 120.00  . Smokeless tobacco: Never Used  . Tobacco comment: quit in 2009  Substance and Sexual Activity  . Alcohol use: No    Comment: occ.  . Drug use: No  . Sexual activity: Not Currently  Lifestyle  . Physical activity:    Days per week: Patient refused    Minutes per session: Patient refused  . Stress: Patient refused  Relationships  . Social connections:    Talks on phone: Patient refused    Gets together: Patient refused    Attends religious service: Patient refused    Active member of club or organization: Patient refused    Attends meetings of clubs or organizations: Patient refused    Relationship status: Patient refused  Other Topics Concern  . Not on file  Social History Narrative  . Not on file   Family History  Problem Relation Age of Onset  . Heart attack Mother   . Colon cancer Neg Hx   . Liver disease Neg Hx    Scheduled Meds: . aspirin  81 mg Oral Daily  . atorvastatin  20 mg Oral QHS  . benztropine  0.5 mg Oral BID  . carvedilol  12.5 mg Oral BID  . chlorhexidine  15 mL Mouth Rinse BID  . cholecalciferol  1,000 Units Oral Daily  . colchicine  0.6 mg Oral Daily  . divalproex  500 mg Oral TID  . enoxaparin (LOVENOX) injection  40 mg Subcutaneous Q12H  . famotidine  20 mg Oral BID  . fluPHENAZine  5 mg Oral BID  . insulin aspart  0-5 Units Subcutaneous QHS  . insulin aspart  0-9 Units Subcutaneous TID WC  . levothyroxine  75 mcg Oral Daily  . losartan  25  mg Oral Daily  . mouth rinse  15 mL Mouth Rinse q12n4p  . mometasone-formoterol  2 puff Inhalation BID  . nystatin cream   Topical BID  . umeclidinium bromide  1 puff Inhalation Daily   Continuous Infusions: PRN Meds:.acetaminophen **OR** acetaminophen, albuterol, bisacodyl, nitroGLYCERIN, ondansetron **OR** ondansetron (ZOFRAN) IV, oxyCODONE, senna-docusate Medications Prior to Admission:  Prior to Admission  medications   Medication Sig Start Date End Date Taking? Authorizing Provider  acetaminophen (TYLENOL) 325 MG tablet Take 2 tablets (650 mg total) by mouth every 4 (four) hours as needed for mild pain (temp > 101.5). 10/20/15  Yes Gladstone Lighter, MD  albuterol (PROVENTIL HFA;VENTOLIN HFA) 108 (90 Base) MCG/ACT inhaler Inhale 2 puffs into the lungs every 6 (six) hours as needed for wheezing or shortness of breath.   Yes [provider]  aspirin EC 81 MG tablet Take 81 mg by mouth daily.   Yes [provider]  atorvastatin (LIPITOR) 20 MG tablet Take 20 mg by mouth at bedtime.   Yes [provider]  benztropine (COGENTIN) 0.5 MG tablet Take 0.5 mg by mouth 2 (two) times daily.   Yes [provider]  carvedilol (COREG) 12.5 MG tablet Take 1 tablet (12.5 mg total) by mouth 2 (two) times daily. 11/08/17  Yes Salary, [name], MD  cetirizine (ZYRTEC) 10 MG tablet Take 10 mg by mouth daily.   Yes [provider]  cholecalciferol (VITAMIN D) 1000 units tablet Take 1,000 Units by mouth daily.   Yes [provider]  divalproex (DEPAKOTE) 500 MG DR tablet Take 1 tablet (500 mg total) by mouth 3 (three) times daily. 08/15/17  Yes Wieting, Richard, MD  docusate sodium (COLACE) 100 MG capsule Take 100 mg by mouth 2 (two) times daily.    Yes [provider]  famotidine (PEPCID) 20 MG tablet Take 20 mg by mouth 2 (two) times daily.   Yes [provider]  fluPHENAZine (PROLIXIN) 5 MG/ML solution Take 5 mg by mouth 2 (two) times  daily.    Yes [provider]  Fluticasone-Salmeterol (ADVAIR) 250-50 MCG/DOSE AEPB Inhale 1 puff into the lungs 2 (two) times daily.   Yes [provider]  furosemide (LASIX) 20 MG tablet Take 4 tablets (80 mg total) by mouth daily. 02/16/18  Yes Mayo, Pete Pelt, MD  insulin aspart (NOVOLOG) 100 UNIT/ML injection Inject 4 Units into the skin 4 (four) times daily. 02/16/18  Yes Mayo, Pete Pelt, MD  insulin glargine (LANTUS) 100 UNIT/ML injection Inject 11 Units into the skin at bedtime.    Yes [provider]  levothyroxine (SYNTHROID, LEVOTHROID) 75 MCG tablet Take 1 tablet by mouth daily. 04/05/17  Yes [provider]  losartan (COZAAR) 25 MG tablet Take 25 mg by mouth daily.   Yes [provider]  Melatonin (SM MELATONIN) 3 MG TABS Take 3 mg by mouth at bedtime as needed (sleep).   Yes [provider]  nitroGLYCERIN (NITROSTAT) 0.4 MG SL tablet Place 0.4 mg under the tongue every 5 (five) minutes as needed for chest pain.   Yes [provider]  sitaGLIPtin (JANUVIA) 50 MG tablet Take 50 mg by mouth daily.   Yes [provider]  umeclidinium bromide (INCRUSE ELLIPTA) 62.5 MCG/INH AEPB Inhale 1 puff into the lungs daily.   Yes [provider]   Allergies  Allergen Reactions  . Haldol [Haloperidol Decanoate] Swelling and Other (See Comments)    Reaction:  Swelling of tongue and blurred vision    . Prednisone Other (See Comments)    Unknown reaction  . Raspberry Swelling and Other (See Comments)    Reaction:  Swelling of lips   . Metformin Diarrhea   Review of Systems  Unable to perform ROS: Dementia    Physical Exam Vitals signs and nursing note reviewed.  Constitutional:      Appearance: She is  obese.     Comments: Makes and somewhat keeps eye contact, calm not fearful, obese  HENT:     Head: Normocephalic and atraumatic.  Cardiovascular:     Rate and Rhythm: Normal rate.  Pulmonary:     Effort:  Pulmonary effort is normal. No respiratory distress.  Abdominal:     Palpations: Abdomen is soft.     Tenderness: There is no abdominal tenderness. There is no guarding.     Comments: Obese abdomen, no guarding  Musculoskeletal:        General: No swelling.  Skin:    Comments: Wounds as noted by nursing staff  Neurological:     Comments: Known dementia  Psychiatric:     Comments: Calm and cooperative, not fearful     Vital Signs: BP 120/63 (BP Location: Right Arm)   Pulse 67   Temp 98.4 F (36.9 C) (Oral)   Resp 16   Ht 5\' 6"  (1.676 m)   Wt 124.7 kg   SpO2 100%   BMI 44.39 kg/m  Pain Scale: PAINAD   Pain Score: Asleep   SpO2: SpO2: 100 % O2 Device:SpO2: 100 % O2 Flow Rate: .O2 Flow Rate (L/min): 2 L/min  IO: Intake/output summary:   Intake/Output Summary (Last 24 hours) at 03/29/2018 1311 Last data filed at 03/29/2018 0129 Gross per 24 hour  Intake -  Output 450 ml  Net -450 ml    LBM: Last BM Date: (unknown, laxative given 03/29/2018) Baseline Weight: Weight: 124.7 kg Most recent weight: Weight: 124.7 kg     Palliative Assessment/Data:   Flowsheet Rows     Most Recent Value  Intake Tab  Referral Department  Hospitalist  Unit at Time of Referral  Cardiac/Telemetry Unit  Palliative Care Primary Diagnosis  Other (Comment)  Date Notified  03/28/18  Palliative Care Type  New Palliative care  Reason for referral  Clarify Goals of Care  Date of Admission  03/27/18  Date first seen by Palliative Care  03/28/18  # of days Palliative referral response time  0 Day(s)  # of days IP prior to Palliative referral  1  Clinical Assessment  Palliative Performance Scale Score  30%  Pain Max last 24 hours  Not able to report  Pain Min Last 24 hours  Not able to report  Dyspnea Max Last 24 Hours  Not able to report  Dyspnea Min Last 24 hours  Not able to report  Psychosocial & Spiritual Assessment  Palliative Care Outcomes      Time In: 1020 Time Out: 1110 Time  Total: 50 minutes Greater than 50%  of this time was spent counseling and coordinating care related to the above assessment and plan.  Signed by: Drue Novel, NP   Please contact Palliative Medicine Team phone at [phone] for questions and concerns.  For individual provider: See Shea Evans

## 2018-03-29 NOTE — Progress Notes (Signed)
Patient able to eat approx. 40% of dinner with this RN at bedside to assist.

## 2018-03-29 NOTE — Progress Notes (Signed)
MD Posey Pronto made aware of Uric acid level, no new orders received.

## 2018-03-29 NOTE — Progress Notes (Signed)
[place] [facility] NAME: [name] [name]    MR#:  [id number]  DATE OF BIRTH:  [birth year]  Historian, patient seems to have left shoulder pain, left humerus x-ray yesterday did not show any fracture.  Uric acid is elevated, treating her for gout.  CHIEF COMPLAINT:   Chief Complaint  Patient presents with  . Weakness  . Rectal Pain  . Leg Pain    REVIEW OF SYSTEMS:   Unable to obtain review of systems because of dementia. Patient screaming in pain when touching  the left shoulder DRUG ALLERGIES:   Allergies  Allergen Reactions  . Haldol [Haloperidol Decanoate] Swelling and Other (See Comments)    Reaction:  Swelling of tongue and blurred vision    . Prednisone Other (See Comments)    Unknown reaction  . Raspberry Swelling and Other (See Comments)    Reaction:  Swelling of lips   . Metformin Diarrhea    VITALS:  Blood pressure 120/63, pulse 67, temperature 98.1 F (36.7 C), temperature source Oral, resp. rate 16, height 5\' 6"  (1.676 m), weight 124.7 kg, SpO2 100 %.  PHYSICAL EXAMINATION:  GENERAL:  73 y.o.-year-old patient lying in the bed with no acute distress.  Patient very dry. EYES: Pupils equal, round, reactive to light . No scleral icterus. Extraocular muscles intact.  HEENT: Head atraumatic, normocephalic. Oropharynx and nasopharynx clear.  NECK:  Supple, no jugular venous distention. No thyroid enlargement, no tenderness.  LUNGS: Normal breath sounds bilaterally, no wheezing, rales,rhonchi or crepitation. No use of accessory muscles of respiration.  CARDIOVASCULAR: S1, S2 normal. No murmurs, rubs, or gallops.  ABDOMEN: Soft, nontender, nondistended. Bowel sounds present. No organomegaly or mass.  EXTREMITIES: No pedal edema, cyanosis, or clubbing.  Left shoulder discomfort, limited range of motion, patient is appearing uncomfortable and lifting left hand.  No obvious swelling noted. NEUROLOGIC: Has advanced  Alzheimer's dementia, unable to follow commands for full neuro exam but has no gross neurological deficit. PSYCHIATRIC: Slow to respond, told that she is at [facility] to the nurse this morning but she could not tell that.  saying that she is hurting but she could not tell me where she is hurting SKIN: No obvious rash, lesion, or ulcer.    LABORATORY PANEL:   CBC Recent Labs  Lab 03/26/18 2241  WBC 9.7  HGB 12.8  HCT 41.0  PLT 201   ------------------------------------------------------------------------------------------------------------------  Chemistries  Recent Labs  Lab 03/26/18 2241 03/27/18 0717  NA 142 140  K 3.3* 4.2  CL 94* 98  CO2 40* 39*  GLUCOSE 107* 116*  BUN 16 15  CREATININE 1.09* 0.99  CALCIUM 9.5 9.0  MG  --  1.8  AST 24  --   ALT 14  --   ALKPHOS 47  --   BILITOT 0.6  --    ------------------------------------------------------------------------------------------------------------------  Cardiac Enzymes Recent Labs  Lab 03/27/18 0717  TROPONINI <0.03   ------------------------------------------------------------------------------------------------------------------  RADIOLOGY:  Dg Humerus Left  Result Date: 03/27/2018 CLINICAL DATA:  Left arm pain. No known injury. EXAM: LEFT HUMERUS - 2+ VIEW COMPARISON:  04/24/2015. Left shoulder dated 01/20/2016 and 08/30/2015. FINDINGS: Artifacts overlying the elbow region on the frontal view. The frontal view is oblique in the elbows difficult to assess on that view. No gross abnormality of the elbow on the lateral view with no visible effusion. Moderate to marked inferior glenohumeral spur formation, similar to that seen on 04/24/2015, with mild spur formation involving the greater  tuberosity. No humerus fracture or dislocation seen. IMPRESSION: 1. No visible humerus fracture or dislocation. 2. The elbow is suboptimally assessed. 3. Moderate to marked glenohumeral joint degenerative changes. Electronically  Signed   By: Claudie Revering M.D.   On: 03/27/2018 21:02    EKG:   Orders placed or performed during the hospital encounter of 03/26/18  . ED EKG  . ED EKG  . EKG 12-Lead  . EKG 12-Lead    ASSESSMENT AND PLAN:   73 year old female with advanced Alzheimer's dementia, diabetes mellitus type 2, essential hypertension, decubitus ulcers brought in because of generalized weakness, poor p.o. intake. 1.  Acute kidney injury admission due to ATN and not eating, improved with IV fluids, continue IV fluids because patient if p.o. intake is still poor.  Consulted palliative care because of her advanced age, Alzheimer's dementia not sure her p.o. intake will improve. #2. diabetes mellitus type 2: Continue sliding scale with coverage, started on the diet. 3.  Alzheimer's dementia:, patient already on Depakote, Cogentin, Prolixin.  Continue them. 4.  Essential hypertension: Continue present medicines 5.  General elevated troponins likely demand ischemia, check echo, discontinue telemetry, transfer out of telemetry. #6 right renal mass, highly concerning for renal cell carcinoma, similar size and appearance from previous CAT scans, patient followed by [facility] nephrology, surveillance recommended due to her comorbidities, high risk for surgeries, size and location not ideal for ablation as per documentation from [facility] oncology. #7 left shoulder pain likely due to degeneration and also treating her for possible acute gout, started colchicine, documented that she is allergic to steroids but nobody knows what allergies she has, pharmacy contacted patient son and patient son is not sure what allergy she has, patient is a demented female, due to safety reasons discontinue Solu-Medrol, continue colchicine, oxycodone.  #7 multiple decubiti,, present on admission, patient has MAST, high risk for pressure injury due to limited mobility, patient noted to have scattered nonintact lesions and erythema to buttocks and gluteal  folds, clean buttocks and perineal skin with water , tone and reposition every 2 hours as per wound care nurse.  #8. disposition: Patient needs nursing home placement.  Start with physical therapy.  All the records are reviewed and case discussed with Care Management/Social Workerr. Management plans discussed with the patient, family and they are in agreement.  CODE STATUS: DNR  TOTAL TIME TAKING CARE OF THIS PATIENT: 38 minutes.  More than 50% of time spent in counseling, coordination of care, reviewing old charts in epic care everywhere. We will discharge in next 1 to 2 days depending on clinical condition Epifanio Lesches M.D on 03/29/2018 at 9:54 AM  Between 7am to 6pm - Pager - [phone]  After 6pm go to [email or web address] - password [email or web address] [place] [facility]  Office  [phone]  CC: Primary care physician; [name]   Note: This dictation was prepared with Dragon dictation along with smaller phrase technology. Any transcriptional errors that result from this process are unintentional.

## 2018-03-30 DIAGNOSIS — E860 Dehydration: Principal | ICD-10-CM

## 2018-03-30 LAB — GLUCOSE, CAPILLARY
GLUCOSE-CAPILLARY: 153 mg/dL — AB (ref 70–99)
Glucose-Capillary: 100 mg/dL — ABNORMAL HIGH (ref 70–99)
Glucose-Capillary: 181 mg/dL — ABNORMAL HIGH (ref 70–99)
Glucose-Capillary: 100 mg/dL — ABNORMAL HIGH (ref 70–99)

## 2018-03-30 NOTE — Evaluation (Signed)
Physical Therapy Evaluation Patient Details Name: [name] [name] MRN: [id number] DOB: [birth year] Today's Date: 03/30/2018   History of Present Illness  Pt is a 73 year old female admitted for elevated troponin and decubitus ulcer of sacral region, stage 1.  PMH includes morbid obesity, DM ll, COPD, HLD, OSA and psychosis.  Clinical Impression  Pt is a 73 year old female who has been living at home with assistance with all ADL's.  She is currently intermittently responsive, lethargic and limited in movement by L UE pain and stage 1 sacral ulcers.  Pt was willing to participate in supine there ex and demonstrated understanding of exercises, though she required some manual assistance to complete.  Pt attempted sitting at EOB with max +1 assist, reporting severe pain in sacral area and requiring +2 max A to return to bed and for positioning.  Pt will continue to benefit from skilled PT with focus on strength, tolerance to activity and safe functional mobility.  Pt will benefit from SNF placement at this time due to decreased mobility.    Follow Up Recommendations SNF    Equipment Recommendations  (TBD at next venus of care)    Recommendations for Other Services       Precautions / Restrictions Precautions Precautions: Fall Restrictions Weight Bearing Restrictions: No      Mobility  Bed Mobility Overal bed mobility: Needs Assistance Bed Mobility: Sit to Supine;Supine to Sit     Supine to sit: Max assist Sit to supine: Max assist;+2 for physical assistance   General bed mobility comments: Pt reluctant but able to assist in sitting upright, reported sacral pain immediately.  Pt heavily reliant on UE's for support.  RN and PT assisted pt back to bed.    Transfers                    Ambulation/Gait                Stairs            Wheelchair Mobility    Modified Rankin (Stroke Patients Only)       Balance Overall balance assessment: Needs  assistance Sitting-balance support: Bilateral upper extremity supported Sitting balance-Leahy Scale: Poor Sitting balance - Comments: Limited by pain Postural control: Left lateral lean                                   Pertinent Vitals/Pain Pain Assessment: Faces Faces Pain Scale: Hurts whole lot Pain Location: L UE with positioning in bed, sacral region when sitting up Pain Descriptors / Indicators: Grimacing;Guarding;Moaning Pain Intervention(s): Limited activity within patient's tolerance;Monitored during session    Home Living Family/patient expects to be discharged to:: Skilled nursing facility                      Prior Function Level of Independence: Needs assistance   Gait / Transfers Assistance Needed: Pt unable to give verbal hx consistently.  Replied simply "no" when asked if she had been walking.           Hand Dominance        Extremity/Trunk Assessment   Upper Extremity Assessment Upper Extremity Assessment: Generalized weakness;LUE deficits/detail;Difficult to assess due to impaired cognition LUE: Unable to fully assess due to pain    Lower Extremity Assessment Lower Extremity Assessment: Generalized weakness;Difficult to assess due to impaired cognition    Cervical /  Trunk Assessment Cervical / Trunk Assessment: Normal  Communication   Communication: (Pt intermittently communicative.)  Cognition Arousal/Alertness: Lethargic Behavior During Therapy: WFL for tasks assessed/performed;Anxious                                          General Comments      Exercises Other Exercises Other Exercises: Supine ankle pumps, heel slides and SAQ's with min A AAROM x10.  Pt demonstrated understanding of ther ex and performed on her own to the best of her ability.     Assessment/Plan    PT Assessment Patient needs continued PT services  PT Problem List Decreased strength;Decreased mobility;Decreased activity  tolerance;Decreased balance;Pain       PT Treatment Interventions DME instruction;Functional mobility training;Balance training;Patient/family education;Gait training;Therapeutic activities;Therapeutic exercise;Cognitive remediation    PT Goals (Current goals can be found in the Care Plan section)  Acute Rehab PT Goals PT Goal Formulation: Patient unable to participate in goal setting    Frequency Min 2X/week   Barriers to discharge        Co-evaluation               AM-PAC PT "6 Clicks" Mobility  Outcome Measure Help needed turning from your back to your side while in a flat bed without using bedrails?: A Lot Help needed moving from lying on your back to sitting on the side of a flat bed without using bedrails?: A Lot Help needed moving to and from a bed to a chair (including a wheelchair)?: A Lot Help needed standing up from a chair using your arms (e.g., wheelchair or bedside chair)?: Total Help needed to walk in hospital room?: Total Help needed climbing 3-5 steps with a railing? : Total 6 Click Score: 9    End of Session Equipment Utilized During Treatment: Oxygen Activity Tolerance: Patient limited by pain Patient left: in bed;with call bell/phone within reach;with bed alarm set Nurse Communication: Mobility status PT Visit Diagnosis: Muscle weakness (generalized) (M62.81);Difficulty in walking, not elsewhere classified (R26.2)    Time: 8185-6314 PT Time Calculation (min) (ACUTE ONLY): 20 min   Charges:   PT Evaluation $PT Eval Moderate Complexity: 1 Mod PT Treatments $Therapeutic Activity: 8-22 mins        Roxanne Gates, PT, DPT   Roxanne Gates 03/30/2018, 12:14 PM

## 2018-03-30 NOTE — Progress Notes (Addendum)
Palliative:  [name] [name] is resting quietly in bed, she makes and mostly keeps eye contact. She is more alert today, is able to tell me her name, but not where we are. She tells me that her bottom is hurting, and I share that she has a wound on her bottom.  [name] [name] is unable to participate in meaningful conversations at this time. There is no family at bedside at this time.   Call to son [name] [name].  He shares that [name] [name] has been having trouble standing, he thinks related to a flare of gout.  He shares she has not been on medications at home to treat gout.  He states that recently she was so weak that she could not stand and it took 3 people to get her ready to go into the ambulance.  We talked about her shoulder pain, and he shares that he also believes this is related to gout.    [name] [name] states that he has seen a decline over the last month stating, "she cannot finish a sentence now".  I shared that things are looking different now.  I share that physical therapy is recommending SNF.  [name] [name] states that he believes rehab would be good choice.  He tells me that his choice would be [name] [name] with transition to living and the facility, (residential).  [name] [name] shares that [name] [name] had lived in a nursing home in past, and I believe this was [name] [name].    [name] [name] asks about placing a feeding tube.  He shares that the workers at the SNF do not feed her well enough.  He shares that he understands, "the girls do not have enough time to feed her".  I share that a feeding tube is not recommended, in particular with dementia a terminal and incurable illness. I share that at this point [name] [name] does not qualify for tube feeding.  I ask [name] [name] to consider if [name] [name] would like the pleasure of taste of food, also share my concern about her pulling out the tube.   Conference with social worker related to goals of care/disposition conversation with son [name] [name].  Family choice for rehab.  45 minutes, extended time [name] Axe, NP  Palliative Medicine Team  Team Phone # [phone]  Greater than 50% of this time was spent counseling and coordinating care related to the above assessment and plan.

## 2018-03-30 NOTE — Care Management Important Message (Signed)
Copy of signed Medicare IM left with patient in room. 

## 2018-03-30 NOTE — Progress Notes (Signed)
[place] at [place] NAME: [name] [name]    MR#:  [id number]  DATE OF BIRTH:  May 24, 1945  Poor p.o. intake, screaming in pain but unable to tell where she hurts.  Poor historian.  No family present seen by palliative care. no Safe discharge plan yet.  CHIEF COMPLAINT:   Chief Complaint  Patient presents with  . Weakness  . Rectal Pain  . Leg Pain    REVIEW OF SYSTEMS:   Unable to obtain review of systems because of dementia.  DRUG ALLERGIES:   Allergies  Allergen Reactions  . Haldol [Haloperidol Decanoate] Swelling and Other (See Comments)    Reaction:  Swelling of tongue and blurred vision    . Prednisone Other (See Comments)    Unknown reaction  . Raspberry Swelling and Other (See Comments)    Reaction:  Swelling of lips   . Metformin Diarrhea    VITALS:  Blood pressure (!) 146/66, pulse 65, temperature (!) 97.4 F (36.3 C), resp. rate 18, height 5\' 6"  (1.676 m), weight 124.7 kg, SpO2 100 %.  PHYSICAL EXAMINATION:  GENERAL:  73 y.o.-year-old patient lying in the bed with no acute distress.  Patient very dry. EYES: Pupils equal, round, reactive to light . No scleral icterus. Extraocular muscles intact.  HEENT: Head atraumatic, normocephalic. Oropharynx and nasopharynx clear.  NECK:  Supple, no jugular venous distention. No thyroid enlargement, no tenderness.  LUNGS: Normal breath sounds bilaterally, no wheezing, rales,rhonchi or crepitation. No use of accessory muscles of respiration.  CARDIOVASCULAR: S1, S2 normal. No murmurs, rubs, or gallops.  ABDOMEN: Soft, nontender, nondistended. Bowel sounds present. No organomegaly or mass.  EXTREMITIES: No pedal edema, cyanosis, or clubbing.  NEUROLOGIC: Has advanced Alzheimer's dementia, unable to follow commands for full neuro exam but has no gross neurological deficit. PSYCHIATRIC: Slow to respond, told that she is at [facility] to the nurse this morning but she could not tell  that.  saying that she is hurting but she could not tell me where she is hurting SKIN: No obvious rash, lesion, or ulcer.    LABORATORY PANEL:   CBC Recent Labs  Lab 03/26/18 2241  WBC 9.7  HGB 12.8  HCT 41.0  PLT 201   ------------------------------------------------------------------------------------------------------------------  Chemistries  Recent Labs  Lab 03/26/18 2241 03/27/18 0717  NA 142 140  K 3.3* 4.2  CL 94* 98  CO2 40* 39*  GLUCOSE 107* 116*  BUN 16 15  CREATININE 1.09* 0.99  CALCIUM 9.5 9.0  MG  --  1.8  AST 24  --   ALT 14  --   ALKPHOS 47  --   BILITOT 0.6  --    ------------------------------------------------------------------------------------------------------------------  Cardiac Enzymes Recent Labs  Lab 03/27/18 0717  TROPONINI <0.03   ------------------------------------------------------------------------------------------------------------------  RADIOLOGY:  No results found.  EKG:   Orders placed or performed during the hospital encounter of 03/26/18  . ED EKG  . ED EKG  . EKG 12-Lead  . EKG 12-Lead    ASSESSMENT AND PLAN:   73 year old female with advanced Alzheimer's dementia, diabetes mellitus type 2, essential hypertension, decubitus ulcers brought in because of generalized weakness, poor p.o. intake. 1.  Acute kidney injury admission due to ATN, patient is alert risk for developing.  Acute renal failure.  #2. diabetes mellitus type 2: Continue sliding scale with coverage, started on the diet. 3.  Alzheimer's dementia:, patient already on Depakote, Cogentin, Prolixin.  Continue them.  Seen by palliative care  team, patient is DNR but no further plan yet.  Transfer the patientto MedSurg bed.. 4.  Essential hypertension: Continue present medicines 5.  General elevated troponins likely demand ischemia, check echo= #6 right renal mass, highly concerning for renal cell carcinoma, similar size and appearance from previous CAT  scans, patient followed by [facility] nephrology, surveillance recommended due to her comorbidities, high risk for surgeries, size and location not ideal for ablation as per documentation from [facility] oncology. Prognosis very poor, due to advanced dementia, multiple medical problems All the records are reviewed and case discussed with Care Management/Social Workerr. Management plans discussed with the patient, family and they are in agreement.  CODE STATUS: DNR  TOTAL TIME TAKING CARE OF THIS PATIENT: 38 minutes.  More than 50% of time spent in counseling, coordination of care, reviewing old charts in epic care everywhere. We will discharge in next 1 to 2 days depending on clinical condition Epifanio Lesches M.D on 03/30/2018 at 9:32 AM  Between 7am to 6pm - Pager - [phone]  After 6pm go to [email or web address] - password [email or web address] [place] [facility]  Office  [phone]  CC: Primary care physician; [name]   Note: This dictation was prepared with Dragon dictation along with smaller phrase technology. Any transcriptional errors that result from this process are unintentional.

## 2018-03-30 NOTE — Clinical Social Work Note (Addendum)
CSW attempted to call patient's son and unable to leave a message on voice mail due to voice mail being full, CSW will attempt tomorrow.  Patient has some confusion, will need assessment, patient's son informed palliative that he would like patient to go to SNF for short term and then transition to long term care.  Patient has been faxed out awaiting bed offers and also will need [facility].  Jones Broom. Norval Morton, MSW, [place]  03/30/2018 6:36 PM

## 2018-03-30 NOTE — NC FL2 (Signed)
[place] LEVEL OF CARE SCREENING TOOL     IDENTIFICATION  Patient Name: [name] [name] Birthdate: 1945/09/19 Sex: female Admission Date (Current Location): 03/26/2018  [place] and [place] Number:  [name] [name] [id number] [place] and Address:  [facility], [address], [place], [place] [address]      Provider Number: [id number]  Attending Physician Name and Address:  Epifanio Lesches, MD  Relative Name and Phone Number:  [name] [phone]     Current Level of Care: Hospital Recommended Level of Care: [place] Prior Approval Number:    Date Approved/Denied:   PASRR Number: [id number]  Discharge Plan: SNF    Current Diagnoses: Patient Active Problem List   Diagnosis Date Noted  . Dehydration   . Decubitus ulcer of sacral region, stage 1   . Goals of care, counseling/discussion   . Palliative care by specialist   . Elevated troponin 03/27/2018  . AMS (altered mental status) 02/14/2018  . Pressure injury of skin 11/08/2017  . Acute and chronic respiratory failure with hypercapnia ([place]) 11/05/2017  . Acute renal failure (ARF) ([place]) 08/05/2017  . ARF (acute renal failure) ([place]) 08/03/2017  . UTI (urinary tract infection) 01/16/2016  . Altered mental status 01/16/2016  . Metabolic encephalopathy   . Acute on chronic respiratory failure with hypoxia and hypercapnia ([place]) 10/13/2015  . Involuntary commitment 09/02/2015  . Schizoaffective disorder, bipolar type ([place])   . Urinary incontinence 10/30/2014  . GERD (gastroesophageal reflux disease) 10/30/2014  . OSA (obstructive sleep apnea) 10/30/2014  . Osteoarthrosis, unspecified whether generalized or localized, involving lower leg 10/30/2014  . COPD (chronic obstructive pulmonary disease) ([place]) 10/30/2014  . Chronic diastolic CHF (congestive heart failure) ([place]) 05/15/2014  . Morbid obesity ([place])   . History of colon polyps 03/09/2013  . Renal mass  06/26/2011  . Lytic bone lesion of hip 06/25/2011  . Hypertension 06/24/2011  . Diabetes mellitus ([place]) 06/24/2011    Orientation RESPIRATION BLADDER Height & Weight     Self, Place  O2(2L) Incontinent Weight: 275 lb (124.7 kg) Height:  5\' 6"  (167.6 cm)  BEHAVIORAL SYMPTOMS/MOOD NEUROLOGICAL BOWEL NUTRITION STATUS      Incontinent Diet(Heart Healthy Carb Modified)  AMBULATORY STATUS COMMUNICATION OF NEEDS Skin   Limited Assist Verbally PU Stage and Appropriate Care   PU Stage 2 Dressing: (PRN dressing change)                   Personal Care Assistance Level of Assistance  Bathing, Feeding, Dressing Bathing Assistance: Limited assistance Feeding assistance: Independent Dressing Assistance: Limited assistance     Functional Limitations Info  Sight, Hearing, Speech Sight Info: Adequate Hearing Info: Adequate Speech Info: Adequate    SPECIAL CARE FACTORS FREQUENCY  PT (By licensed PT), OT (By licensed OT)     PT Frequency: 5x a week OT Frequency: 5x a week            Contractures Contractures Info: Not present    Additional Factors Info  Allergies, Code Status, Psychotropic, Insulin Sliding Scale Code Status Info: DNR Allergies Info: HALDOL HALOPERIDOL DECANOATE, PREDNISONE, RASPBERRY, METFORMIN  Psychotropic Info: divalproex (DEPAKOTE) DR tablet 500 mg  Insulin Sliding Scale Info: insulin aspart (novoLOG) injection 0-9 Units 3x a day with meals       Current Medications (03/30/2018):  This is the current hospital active medication list Current Facility-Administered Medications  Medication Dose Route Frequency Provider Last Rate Last Dose  . acetaminophen (TYLENOL) tablet 650 mg  650  mg Oral Q6H PRN Epifanio Lesches, MD   650 mg at 03/28/18 2121   Or  . acetaminophen (TYLENOL) suppository 650 mg  650 mg Rectal Q6H PRN Epifanio Lesches, MD      . albuterol (PROVENTIL) (2.5 MG/3ML) 0.083% nebulizer solution 2.5 mg  2.5 mg Nebulization Q6H PRN Epifanio Lesches, MD      . aspirin chewable tablet 81 mg  81 mg Oral Daily Epifanio Lesches, MD   81 mg at 03/30/18 0944  . atorvastatin (LIPITOR) tablet 20 mg  20 mg Oral QHS Epifanio Lesches, MD   20 mg at 03/29/18 2143  . benztropine (COGENTIN) tablet 0.5 mg  0.5 mg Oral BID Epifanio Lesches, MD   0.5 mg at 03/30/18 0947  . bisacodyl (DULCOLAX) EC tablet 5 mg  5 mg Oral Daily PRN Epifanio Lesches, MD   5 mg at 03/29/18 0959  . carvedilol (COREG) tablet 12.5 mg  12.5 mg Oral BID Epifanio Lesches, MD   12.5 mg at 03/30/18 0944  . chlorhexidine (PERIDEX) 0.12 % solution 15 mL  15 mL Mouth Rinse BID Epifanio Lesches, MD   15 mL at 03/30/18 0943  . cholecalciferol (VITAMIN D) tablet 1,000 Units  1,000 Units Oral Daily Epifanio Lesches, MD   1,000 Units at 03/30/18 0946  . colchicine tablet 0.6 mg  0.6 mg Oral Daily Epifanio Lesches, MD   0.6 mg at 03/30/18 0944  . divalproex (DEPAKOTE) DR tablet 500 mg  500 mg Oral TID Epifanio Lesches, MD   500 mg at 03/30/18 1710  . enoxaparin (LOVENOX) injection 40 mg  40 mg Subcutaneous Q12H Epifanio Lesches, MD   40 mg at 03/30/18 1711  . famotidine (PEPCID) tablet 20 mg  20 mg Oral BID Epifanio Lesches, MD   20 mg at 03/30/18 0944  . fluPHENAZine (PROLIXIN) 5 MG/ML solution 5 mg  5 mg Oral BID Epifanio Lesches, MD   5 mg at 03/29/18 2234  . insulin aspart (novoLOG) injection 0-5 Units  0-5 Units Subcutaneous QHS Epifanio Lesches, MD      . insulin aspart (novoLOG) injection 0-9 Units  0-9 Units Subcutaneous TID WC Epifanio Lesches, MD   2 Units at 03/30/18 1325  . levothyroxine (SYNTHROID, LEVOTHROID) tablet 75 mcg  75 mcg Oral Daily Epifanio Lesches, MD   75 mcg at 03/30/18 0943  . losartan (COZAAR) tablet 25 mg  25 mg Oral Daily Epifanio Lesches, MD   25 mg at 03/30/18 0944  . MEDLINE mouth rinse  15 mL Mouth Rinse q12n4p Epifanio Lesches, MD   15 mL at 03/30/18 1706  . mometasone-formoterol  (DULERA) 200-5 MCG/ACT inhaler 2 puff  2 puff Inhalation BID Epifanio Lesches, MD   2 puff at 03/30/18 0945  . nitroGLYCERIN (NITROSTAT) SL tablet 0.4 mg  0.4 mg Sublingual Q5 min PRN Epifanio Lesches, MD      . nystatin cream (MYCOSTATIN)   Topical BID Epifanio Lesches, MD      . ondansetron (ZOFRAN) tablet 4 mg  4 mg Oral Q6H PRN Epifanio Lesches, MD       Or  . ondansetron (ZOFRAN) injection 4 mg  4 mg Intravenous Q6H PRN Epifanio Lesches, MD      . oxyCODONE (Oxy IR/ROXICODONE) immediate release tablet 5 mg  5 mg Oral Q6H PRN Epifanio Lesches, MD   5 mg at 03/29/18 0734  . senna-docusate (Senokot-S) tablet 1 tablet  1 tablet Oral QHS PRN Epifanio Lesches, MD      . umeclidinium bromide (  INCRUSE ELLIPTA) 62.5 MCG/INH 1 puff  1 puff Inhalation Daily Epifanio Lesches, MD   1 puff at 03/30/18 4847     Discharge Medications: Please see discharge summary for a list of discharge medications.  Relevant Imaging Results:  Relevant Lab Results:   Additional Information SS# [id number]  [name] [name]

## 2018-03-31 ENCOUNTER — Inpatient Hospital Stay: Payer: Medicare HMO

## 2018-03-31 ENCOUNTER — Other Ambulatory Visit: Payer: Self-pay

## 2018-03-31 ENCOUNTER — Inpatient Hospital Stay: Admit: 2018-03-31 | Payer: Medicare HMO

## 2018-03-31 LAB — GLUCOSE, CAPILLARY
GLUCOSE-CAPILLARY: 107 mg/dL — AB (ref 70–99)
Glucose-Capillary: 127 mg/dL — ABNORMAL HIGH (ref 70–99)
Glucose-Capillary: 148 mg/dL — ABNORMAL HIGH (ref 70–99)
Glucose-Capillary: 100 mg/dL — ABNORMAL HIGH (ref 70–99)

## 2018-03-31 MED ORDER — LORAZEPAM 2 MG/ML IJ SOLN
1.0000 mg | Freq: Once | INTRAMUSCULAR | Status: AC
  Administered 2018-03-31: 1 mg via INTRAVENOUS
  Filled 2018-03-31: qty 1

## 2018-03-31 NOTE — Clinical Social Work Note (Signed)
CSW spoke with patient's son: [name]: [phone] this afternoon and extended bed offers. [name] chose [facility]. Previous CSW had begun prior auth with [facility]. Currently awaiting insurance disposition. Shela Leff MSw,LCSW [phone]

## 2018-03-31 NOTE — Clinical Social Work Note (Signed)
Clinical Social Work Assessment  Patient Details  Name: [name] [name] MRN: [id number] Date of Birth: 1946-01-10  Date of referral:  03/31/18               Reason for consult:  Facility Placement                Permission sought to share information with:  Facility Sport and exercise psychologist Permission granted to share information::  Yes, Verbal Permission Granted  Name::     [name] [name] [phone] or   Agency::  SNF admissions  Relationship::     Contact Information:     Housing/Transportation Living arrangements for the past 2 months:  Single Family Home Source of Information:  Adult Children Patient Interpreter Needed:  None Criminal Activity/Legal Involvement Pertinent to Current Situation/Hospitalization:  No - Comment as needed Significant Relationships:  Adult Children Lives with:    Do you feel safe going back to the place where you live?  No Need for family participation in patient care:  Yes (Comment)  Care giving concerns:  [name] feels that he can no longer meet patient's needs.   Social Worker assessment / plan:  Patient is a 73 year old female who has dementia, patient lives with patient's son.  Patient is alert and oriented x2, patient's son states that he can no longer help take care of her and will need long term placement after short term rehab.  CSW explained to patient's son that insurance will have to approve for SNF placement.  [name] expressed understanding, he stated that patient has been to SNF before and he is familiar with the process.  [name] stated that she has been to [place] and [facility], he would prefer either one of the facilities.  CSW was given permission to begin bed search in [facility] per patient's son's request.  [name] did not have any other questions or concerns.  Employment status:  Retired Nurse, adult, Medicaid In [place] PT Recommendations:  [place] / Referral to community resources:  [place]  Patient/Family's Response to care:  Patient's family is in agreement to going to SNF.  Patient/Family's Understanding of and Emotional Response to Diagnosis, Current Treatment, and Prognosis: [name] is hopeful that patient will be approved to go to SNF in [place] and can transition to long term care.  Emotional Assessment Appearance:  Appears stated age Attitude/Demeanor/Rapport:    Affect (typically observed):  Accepting, Appropriate Orientation:  Oriented to Self, Oriented to Place Alcohol / Substance use:  Not Applicable Psych involvement (Current and /or in the community):  Yes (Comment)  Discharge Needs  Concerns to be addressed:  Lack of Support, Cognitive Concerns, Care Coordination Readmission within the last 30 days:  No Current discharge risk:  Cognitively Impaired, Lack of support system Barriers to Discharge:  Continued Medical Work up, [employer]   [name] [name] 03/31/2018, 9:47 AM

## 2018-03-31 NOTE — Progress Notes (Signed)
[place] at [place] NAME: [name] [name]    MR#:  [id number]  DATE OF BIRTH:  06-15-1945 stable for discharge to rehab when the bed is available. Patient stable for discharge to go to rehab when the bed is available. Chief Complaint  Patient presents with  . Weakness  . Rectal Pain  . Leg Pain    REVIEW OF SYSTEMS:   Unable to obtain review of systems because of dementia.  DRUG ALLERGIES:   Allergies  Allergen Reactions  . Haldol [Haloperidol Decanoate] Swelling and Other (See Comments)    Reaction:  Swelling of tongue and blurred vision    . Prednisone Other (See Comments)    Unknown reaction  . Raspberry Swelling and Other (See Comments)    Reaction:  Swelling of lips   . Metformin Diarrhea    VITALS:  Blood pressure 128/81, pulse 65, temperature 97.9 F (36.6 C), temperature source Oral, resp. rate 20, height 5\' 6"  (1.676 m), weight 124.7 kg, SpO2 100 %.  PHYSICAL EXAMINATION:  GENERAL:  73 y.o.-year-old patient lying in the bed with no acute distress.  Patient very dry. EYES: Pupils equal, round, reactive to light . No scleral icterus. Extraocular muscles intact.  HEENT: Head atraumatic, normocephalic. Oropharynx and nasopharynx clear.  NECK:  Supple, no jugular venous distention. No thyroid enlargement, no tenderness.  LUNGS: Normal breath sounds bilaterally, no wheezing, rales,rhonchi or crepitation. No use of accessory muscles of respiration.  CARDIOVASCULAR: S1, S2 normal. No murmurs, rubs, or gallops.  ABDOMEN: Soft, nontender, nondistended. Bowel sounds present. No organomegaly or mass.  EXTREMITIES: No pedal edema, cyanosis, or clubbing.  NEUROLOGIC: Has advanced Alzheimer's dementia, unable to follow commands for full neuro exam but has no gross neurological deficit. PSYCHIATRIC: Slow to respond, told that [name] to the nurse this morning but she could not tell that.  saying that she is hurting but  she could not tell me where she is hurting SKIN: No obvious rash, lesion, or ulcer.    LABORATORY PANEL:   CBC Recent Labs  Lab 03/26/18 2241  WBC 9.7  HGB 12.8  HCT 41.0  PLT 201   ------------------------------------------------------------------------------------------------------------------  Chemistries  Recent Labs  Lab 03/26/18 2241 03/27/18 0717  NA 142 140  K 3.3* 4.2  CL 94* 98  CO2 40* 39*  GLUCOSE 107* 116*  BUN 16 15  CREATININE 1.09* 0.99  CALCIUM 9.5 9.0  MG  --  1.8  AST 24  --   ALT 14  --   ALKPHOS 47  --   BILITOT 0.6  --    ------------------------------------------------------------------------------------------------------------------  Cardiac Enzymes Recent Labs  Lab 03/27/18 0717  TROPONINI <0.03   ------------------------------------------------------------------------------------------------------------------  RADIOLOGY:  No results found.  EKG:   Orders placed or performed during the hospital encounter of 03/26/18  . ED EKG  . ED EKG  . EKG 12-Lead  . EKG 12-Lead    ASSESSMENT AND PLAN:   73 year old female with advanced Alzheimer's dementia, diabetes mellitus type 2, essential hypertension, decubitus ulcers brought in because of generalized weakness, poor p.o. intake. 1.  Acute kidney injury admission due to ATN, improved.  #2. diabetes mellitus type 2: Continue sliding scale with coverage, started on the diet. 3.  Alzheimer's dementia:, patient already on Depakote, Cogentin, Prolixin.  Continue them.  Seen by palliative care team, patient is DNR but no further plan yet.  Transfer the patientto MedSurg bed.. 4.  Essential hypertension: Continue  present medicines 5.  elvated troponins likely demand ischemia, check echo #6 right renal mass, highly concerning for renal cell carcinoma, similar size and appearance from previous CAT scans, patient followed by [facility] nephrology, surveillance recommended due to her comorbidities, high  risk for surgeries, size and location not ideal for ablation as per documentation from [facility] oncology. Prognosis very poor, due to advanced dementia, multiple medical problems #7 disposal to SNF when arrangements are made.  Patient medically stable. All the records are reviewed and case discussed with Care Management/Social Workerr. Management plans discussed with the patient, family and they are in agreement.  CODE STATUS: DNR  TOTAL TIME TAKING CARE OF THIS PATIENT: 38 minutes.  More than 50% of time spent in counseling, coordination of care, reviewing old charts in epic care everywhere. We will discharge in next 1 to 2 days depending on clinical condition Epifanio Lesches M.D on 03/31/2018 at 10:20 AM  Between 7am to 6pm - Pager - [phone]  After 6pm go to [email or web address] - password [email or web address] [place] [facility]  Office  [phone]  CC: Primary care physician; [name]   Note: This dictation was prepared with Dragon dictation along with smaller phrase technology. Any transcriptional errors that result from this process are unintentional.

## 2018-03-31 NOTE — Progress Notes (Signed)
Spoke with patient son [name] [name] who is in the room with patient, he says that patient is more weak for the past 1 month requiring help with feeding and also ambulating, and has been having generalized pains, he is wondering if her dementia can be diagnosed with MRI by told him that MRI of the brain is to diagnose if She has any strokes or tumors.  Dementia is mostly clinical diagnosis.  Will do MRI of the brain without contrast for his satisfaction to make sure there is no organic problem.  Told him that patient needs to go to rehab and also requesting help with ADLs.  He is aware of the problems.

## 2018-03-31 NOTE — Progress Notes (Signed)
MD notified that pt is refusing and not cooperating for the echo procedure that is scheduled for today. No new orders given .   Tommie Dejoseph [employer]

## 2018-03-31 NOTE — Clinical Social Work Note (Addendum)
CSW spoke to patient's son [name] [name], and he would prefer [place] or [facility].  He is requesting SNF placement to transition to long term care.  CSW was informed by patient's son that she was walking short distances with walker as of two weeks ago and assisting with transfers, but she has not been able to since then.  [name] stated that he would assist with personal care and hygiene.  Patient also stated he is requesting the physician to see if a brain scan can be ordered to determine what level of dementia patient is at.  CSW advised him to speak to patient's nurse and physician.  CSW has started bed search in [facility] per patient's son request.  Jones Broom. [place], MSW, [place]  03/31/2018 9:44 AM

## 2018-03-31 NOTE — Progress Notes (Signed)
MRI notified RN that pt is not cooperative during the procedure and had to quit. MD notified for medication that could help pt relax. Verbal order frm MD to give Ativan 1 mg IV one time.   Laruth Hanger [employer]

## 2018-04-01 LAB — GLUCOSE, CAPILLARY
GLUCOSE-CAPILLARY: 123 mg/dL — AB (ref 70–99)
GLUCOSE-CAPILLARY: 142 mg/dL — AB (ref 70–99)
Glucose-Capillary: 134 mg/dL — ABNORMAL HIGH (ref 70–99)
Glucose-Capillary: 149 mg/dL — ABNORMAL HIGH (ref 70–99)

## 2018-04-01 NOTE — Progress Notes (Signed)
[place] at [place] NAME: [name] [name]    MR#:  [id number]  DATE OF BIRTH:  1946/02/03  SUBJECTIVE:  CHIEF COMPLAINT:   Chief Complaint  Patient presents with  . Weakness  . Rectal Pain  . Leg Pain   No new complaints this morning.  Patient sitting up in bed.  Nursing staff to assist with breakfast.  Awaiting discharge to rehab after the weekend.  REVIEW OF SYSTEMS:  ROS  Review of system unobtainable due to underlying dementia.  Patient pleasantly confused.  DRUG ALLERGIES:   Allergies  Allergen Reactions  . Haldol [Haloperidol Decanoate] Swelling and Other (See Comments)    Reaction:  Swelling of tongue and blurred vision    . Prednisone Other (See Comments)    Unknown reaction  . Raspberry Swelling and Other (See Comments)    Reaction:  Swelling of lips   . Metformin Diarrhea   VITALS:  Blood pressure 124/67, pulse 63, temperature 97.9 F (36.6 C), resp. rate 20, height 5\' 6"  (1.676 m), weight 124.7 kg, SpO2 98 %. PHYSICAL EXAMINATION:  Physical Exam  GENERAL:  73 y.o.-year-old patient sitting up in the bed with no acute distress.   EYES: Pupils equal, round, reactive to light . No scleral icterus. Extraocular muscles intact.  HEENT: Head atraumatic, normocephalic. Oropharynx and nasopharynx clear.  NECK:  Supple, no jugular venous distention. No thyroid enlargement, no tenderness.  LUNGS: Normal breath sounds bilaterally, no wheezing, rales,rhonchi or crepitation. No use of accessory muscles of respiration.  CARDIOVASCULAR: S1, S2 normal. No murmurs, rubs, or gallops.  ABDOMEN: Soft, nontender, nondistended. Bowel sounds present. No organomegaly or mass.  EXTREMITIES: No pedal edema, cyanosis, or clubbing.  NEUROLOGIC: Has advanced Alzheimer's dementia, unable to follow commands for full neuro exam but has no gross neurological deficit. PSYCHIATRIC: Slow to respond.  Limited exam due to underlying dementia SKIN: No  obvious rash, lesion, or ulcer.   LABORATORY PANEL:  Female CBC Recent Labs  Lab 03/26/18 2241  WBC 9.7  HGB 12.8  HCT 41.0  PLT 201   ------------------------------------------------------------------------------------------------------------------ Chemistries  Recent Labs  Lab 03/26/18 2241 03/27/18 0717  NA 142 140  K 3.3* 4.2  CL 94* 98  CO2 40* 39*  GLUCOSE 107* 116*  BUN 16 15  CREATININE 1.09* 0.99  CALCIUM 9.5 9.0  MG  --  1.8  AST 24  --   ALT 14  --   ALKPHOS 47  --   BILITOT 0.6  --    RADIOLOGY:  Mr Brain Wo Contrast  Result Date: 03/31/2018 CLINICAL DATA:  Generalized weakness. History of dementia, hypertension diabetes. EXAM: MRI HEAD WITHOUT CONTRAST TECHNIQUE: Axial and coronal diffusion weighted imaging, axial T2 FLAIR and sagittal T1. Patient could not tolerate further imaging and examination with discontinued. COMPARISON:  CT HEAD December 14, 2016 and CT cervical spine April 24, 2015. FINDINGS: Moderately motion degraded examination. INTRACRANIAL CONTENTS: No reduced diffusion to suggest acute ischemia, hypercellular tumor or typical infection. No parenchymal brain volume loss for age. No midline shift or mass effect. No abnormal parenchymal signal. VASCULAR: Nondiagnostic assessment. SKULL AND UPPER CERVICAL SPINE: Partially empty sella. Severe cervical spondylosis and potential canal stenosis. Craniocervical junction maintained. SINUSES/ORBITS: Nondiagnostic assessment. OTHER: None. IMPRESSION: Limited motion degraded 4 sequence MRI of the head: No acute intracranial process. Electronically Signed   By: Elon Alas M.D.   On: 03/31/2018 17:05   ASSESSMENT AND PLAN:    73 year old female with  advanced Alzheimer's dementia, diabetes mellitus type 2, essential hypertension, decubitus ulcers brought in because of generalized weakness, poor p.o. intake.  1.  Acute kidney injury admission due to ATN, improved  2. diabetes mellitus type 2: Continue  sliding scale with coverage, started on the diet.  3.  Alzheimer's dementia:, patient already on Depakote, Cogentin, .  Continue them.  Seen by palliative care team, patient is DNR but no further plan yet.   Patient had MRI of the brain done without contrast which revealed limited motion degraded 4 sequence MRI of the head: No acute intracranial process.  4.  Essential hypertension: Continue present medicines  5.  elvated troponins likely demand ischemia, with maximum troponin of 0.05.   Check echo  6 right renal mass, highly concerning for renal cell carcinoma, similar size and appearance from previous CAT scans, patient followed by [facility] nephrology, surveillance recommended due to her comorbidities, high risk for surgeries, size and location not ideal for ablation as per documentation from [facility] oncology. Prognosis very poor, due to advanced dementia, multiple medical problems  7. disposal to SNF when arrangements are made.   DVT prophylaxis; Lovenox  All the records are reviewed and case discussed with Care Management/Social Worker. Management plans discussed with the patient, family and they are in agreement.  CODE STATUS: DNR  TOTAL TIME TAKING CARE OF THIS PATIENT: 28 minutes.  I called and updated patient's son Mr. [name] on treatment plans and findings on MRI as outlined above.  He agrees with plan of care as outlined.  More than 50% of the time was spent in counseling/coordination of care: YES  POSSIBLE D/C IN 2 DAYS, DEPENDING ON CLINICAL CONDITION.   [name] [name] M.D on 04/01/2018 at 12:29 PM  Between 7am to 6pm - Pager - [phone]  After 6pm go to [email or web address] - Proofreader  [facility] [place] [facility]  Office  [phone]  CC: Primary care physician; [name]  Note: This dictation was prepared with Dragon dictation along with smaller phrase technology. Any transcriptional errors that result from this process are unintentional.

## 2018-04-02 ENCOUNTER — Inpatient Hospital Stay
Admit: 2018-04-02 | Discharge: 2018-04-02 | Disposition: A | Discharge status: Home / Self Care | Payer: Medicare HMO | Attending: Internal Medicine | Admitting: Internal Medicine

## 2018-04-02 LAB — GLUCOSE, CAPILLARY
GLUCOSE-CAPILLARY: 162 mg/dL — AB (ref 70–99)
Glucose-Capillary: 111 mg/dL — ABNORMAL HIGH (ref 70–99)
Glucose-Capillary: 148 mg/dL — ABNORMAL HIGH (ref 70–99)
Glucose-Capillary: 104 mg/dL — ABNORMAL HIGH (ref 70–99)

## 2018-04-02 LAB — CBC
HCT: 35.4 % — ABNORMAL LOW (ref 36.0–46.0)
Hemoglobin: 10.9 g/dL — ABNORMAL LOW (ref 12.0–15.0)
MCH: 29.1 pg (ref 26.0–34.0)
MCHC: 30.8 g/dL (ref 30.0–36.0)
MCV: 94.7 fL (ref 80.0–100.0)
NRBC: 0 % (ref 0.0–0.2)
Platelets: 146 10*3/uL — ABNORMAL LOW (ref 150–400)
RBC: 3.74 MIL/uL — ABNORMAL LOW (ref 3.87–5.11)
RDW: 13.1 % (ref 11.5–15.5)
WBC: 6.3 10*3/uL (ref 4.0–10.5)

## 2018-04-02 LAB — BASIC METABOLIC PANEL
ANION GAP: 5 (ref 5–15)
BUN: 22 mg/dL (ref 8–23)
CO2: 38 mmol/L — ABNORMAL HIGH (ref 22–32)
Calcium: 8.8 mg/dL — ABNORMAL LOW (ref 8.9–10.3)
Chloride: 97 mmol/L — ABNORMAL LOW (ref 98–111)
Creatinine, Ser: 1.13 mg/dL — ABNORMAL HIGH (ref 0.44–1.00)
GFR calc non Af Amer: 48 mL/min — ABNORMAL LOW (ref >60)
GFR, EST AFRICAN AMERICAN: 56 mL/min — AB (ref >60)
Glucose, Bld: 110 mg/dL — ABNORMAL HIGH (ref 70–99)
Potassium: 4.1 mmol/L (ref 3.5–5.1)
Sodium: 140 mmol/L (ref 135–145)

## 2018-04-02 LAB — MAGNESIUM: Magnesium: 2 mg/dL (ref 1.7–2.4)

## 2018-04-02 LAB — ECHOCARDIOGRAM COMPLETE
Height: 66 in
Weight: 4400 oz

## 2018-04-02 MED ORDER — OCUVITE-LUTEIN PO CAPS
1.0000 | ORAL_CAPSULE | Freq: Every day | ORAL | Status: DC
  Administered 2018-04-03 – 2018-04-04 (×2): 1 via ORAL
  Filled 2018-04-02 (×2): qty 1

## 2018-04-02 MED ORDER — ENSURE MAX PROTEIN PO LIQD
11.0000 [oz_av] | Freq: Two times a day (BID) | ORAL | Status: DC
  Administered 2018-04-03 (×2): 11 [oz_av] via ORAL
  Filled 2018-04-02: qty 330

## 2018-04-02 NOTE — Progress Notes (Signed)
*  PRELIMINARY RESULTS* Echocardiogram 2D Echocardiogram has been performed.  [place] 04/02/2018, 10:51 AM

## 2018-04-02 NOTE — Progress Notes (Signed)
Initial Nutrition Assessment  DOCUMENTATION CODES:   Morbid obesity  INTERVENTION:  Provide Ensure Max protein supplement BID, each supplement provides 150kcal and 30g of protein.  Provide Ocuvite daily for wound healing (provides zinc, vitamin A, vitamin C, Vitamin E, copper, and selenium).  NUTRITION DIAGNOSIS:   Increased nutrient needs related to wound healing, catabolic illness(COPD, CHF) as evidenced by estimated needs.  GOAL:   Patient will meet greater than or equal to 90% of their needs  MONITOR:   PO intake, Supplement acceptance, Labs, Weight trends, I & O's  REASON FOR ASSESSMENT:   Malnutrition Screening Tool    ASSESSMENT:   73 year old female with PMHx of advanced Alzheimer's dementia, DM type 2, HTN, schizophrenia, OA, OSA, left ventricular hypertrophy, CHF, anxiety, COPD, CAD, GERD, arthritis, depression who is admitted with generalized weakness, poor PO intake, AKI, right renal mass highly concerning for renal cell carcinoma.   -Per chart plan is for patient to discharge to SNF.  Patient sitting up in bed sleeping at time of RD assessment. She did not wake to name call or during NFPE. No family members present to provide history. Per chart patient has been finishing 50-95% of meals. Weights in chart appear stable back to 2017. No wasting found on NFPE. Patient does not meet criteria for malnutrition.  Medications reviewed and include: cholecalciferol 1000 units daily, famotidine, Novolog 0-9 units TID, Novolog 0-5 units QHS, levothyroxine.  Labs reviewed: CBG 111-148, Chloride 97, CO2 38, Creatinine 1.13.  NUTRITION - FOCUSED PHYSICAL EXAM:    Most Recent Value  Orbital Region  No depletion  Upper Arm Region  No depletion  Thoracic and Lumbar Region  No depletion  Buccal Region  No depletion  Temple Region  No depletion  Clavicle Bone Region  No depletion  Clavicle and Acromion Bone Region  No depletion  Scapular Bone Region  Unable to assess   Dorsal Hand  No depletion  Patellar Region  No depletion  Anterior Thigh Region  No depletion  Posterior Calf Region  No depletion  Edema (RD Assessment)  Mild  Hair  Reviewed  Eyes  Unable to assess  Mouth  Unable to assess  Skin  Reviewed  Nails  Reviewed     Diet Order:   Diet Order            Diet heart healthy/carb modified Room service appropriate? No; Fluid consistency: Thin  Diet effective now             EDUCATION NEEDS:   Not appropriate for education at this time  Skin:  Skin Assessment: Skin Integrity Issues:(stage II buttocks; MSAD to groin and abdomen)  Last BM:  03/30/2018 - large type 6  Height:   Ht Readings from Last 1 Encounters:  03/27/18 5\' 6"  (1.676 m)   Weight:   Wt Readings from Last 1 Encounters:  03/27/18 124.7 kg   Ideal Body Weight:  59.1 kg  BMI:  Body mass index is 44.39 kg/m.  Estimated Nutritional Needs:   Kcal:  1900-2100  Protein:  95-105 grams  Fluid:  1.5-1.8 L/day  Willey Blade, MS, RD, LDN Office: [phone] Pager: [phone] After Hours/Weekend Pager: [phone]

## 2018-04-02 NOTE — Progress Notes (Signed)
[place] at [place] NAME: Deserai Cansler    MR#:  [id number]  DATE OF BIRTH:  February 13, 1946  SUBJECTIVE:  CHIEF COMPLAINT:   Chief Complaint  Patient presents with  . Weakness  . Rectal Pain  . Leg Pain   No new complaints this morning.  Patient sitting up in bed.  Awaiting discharge to rehab after the weekend.  REVIEW OF SYSTEMS:  ROS  Review of system unobtainable due to underlying dementia.  Patient pleasantly confused.  DRUG ALLERGIES:   Allergies  Allergen Reactions  . Haldol [Haloperidol Decanoate] Swelling and Other (See Comments)    Reaction:  Swelling of tongue and blurred vision    . Prednisone Other (See Comments)    Unknown reaction  . Raspberry Swelling and Other (See Comments)    Reaction:  Swelling of lips   . Metformin Diarrhea   VITALS:  Blood pressure 135/60, pulse 61, temperature 97.6 F (36.4 C), temperature source Oral, resp. rate 18, height 5\' 6"  (1.676 m), weight 124.7 kg, SpO2 100 %. PHYSICAL EXAMINATION:  Physical Exam  GENERAL:  73 y.o.-year-old patient sitting up in the bed with no acute distress.   EYES: Pupils equal, round, reactive to light . No scleral icterus. Extraocular muscles intact.  HEENT: Head atraumatic, normocephalic. Oropharynx and nasopharynx clear.  NECK:  Supple, no jugular venous distention. No thyroid enlargement, no tenderness.  LUNGS: Normal breath sounds bilaterally, no wheezing, rales,rhonchi or crepitation. No use of accessory muscles of respiration.  CARDIOVASCULAR: S1, S2 normal. No murmurs, rubs, or gallops.  ABDOMEN: Soft, nontender, nondistended. Bowel sounds present. No organomegaly or mass.  EXTREMITIES: No pedal edema, cyanosis, or clubbing.  NEUROLOGIC: Has advanced Alzheimer's dementia, unable to follow commands for full neuro exam but has no gross neurological deficit. PSYCHIATRIC: Slow to respond.  Limited exam due to underlying dementia SKIN: No obvious rash,  lesion, or ulcer.   LABORATORY PANEL:  Female CBC Recent Labs  Lab 04/02/18 0446  WBC 6.3  HGB 10.9*  HCT 35.4*  PLT 146*   ------------------------------------------------------------------------------------------------------------------ Chemistries  Recent Labs  Lab 03/26/18 2241  04/02/18 0446  NA 142   < > 140  K 3.3*   < > 4.1  CL 94*   < > 97*  CO2 40*   < > 38*  GLUCOSE 107*   < > 110*  BUN 16   < > 22  CREATININE 1.09*   < > 1.13*  CALCIUM 9.5   < > 8.8*  MG  --    < > 2.0  AST 24  --   --   ALT 14  --   --   ALKPHOS 47  --   --   BILITOT 0.6  --   --    < > = values in this interval not displayed.   RADIOLOGY:  No results found. ASSESSMENT AND PLAN:    73 year old female with advanced Alzheimer's dementia, diabetes mellitus type 2, essential hypertension, decubitus ulcers brought in because of generalized weakness, poor p.o. intake.  1.  Acute kidney injury admission due to ATN, improved  2. diabetes mellitus type 2: Continue sliding scale with coverage, started on the diet.  3.  Alzheimer's dementia:, patient already on Depakote, Cogentin, .  Continue them.  Seen by palliative care team, patient is DNR but no further plan yet.   Patient had MRI of the brain done without contrast which revealed limited motion degraded 4 sequence MRI  of the head: No acute intracranial process.  4.  Essential hypertension: Continue present medicines  5.  elvated troponins likely demand ischemia, with maximum troponin of 0.05.   Check echo  6 Right renal mass, highly concerning for renal cell carcinoma, similar size and appearance from previous CAT scans, patient followed by [facility] nephrology, surveillance recommended due to her comorbidities, high risk for surgeries, size and location not ideal for ablation as per documentation from [facility] oncology. Prognosis very poor, due to advanced dementia, multiple medical problems  7. disposal to SNF when arrangements are made, likely in  a.m.   DVT prophylaxis; Lovenox  All the records are reviewed and case discussed with Care Management/Social Worker. Management plans discussed with the patient, family and they are in agreement.  CODE STATUS: DNR  TOTAL TIME TAKING CARE OF THIS PATIENT: 26 minutes.  I called and updated patient's son Mr. [name] on treatment plans and findings on MRI as outlined above.  He agrees with plan of care as outlined.  More than 50% of the time was spent in counseling/coordination of care: YES  POSSIBLE D/C IN 1 DAYS, DEPENDING ON CLINICAL CONDITION.   Keen Ewalt M.D on 04/02/2018 at 11:38 AM  Between 7am to 6pm - Pager - [phone]  After 6pm go to [email or web address] - Proofreader  [facility]  Office  [phone]  CC: Primary care physician; [name]  Note: This dictation was prepared with Dragon dictation along with smaller phrase technology. Any transcriptional errors that result from this process are unintentional.

## 2018-04-03 LAB — GLUCOSE, CAPILLARY
Glucose-Capillary: 126 mg/dL — ABNORMAL HIGH (ref 70–99)
Glucose-Capillary: 148 mg/dL — ABNORMAL HIGH (ref 70–99)
Glucose-Capillary: 201 mg/dL — ABNORMAL HIGH (ref 70–99)
Glucose-Capillary: 179 mg/dL — ABNORMAL HIGH (ref 70–99)
Glucose-Capillary: 161 mg/dL — ABNORMAL HIGH (ref 70–99)

## 2018-04-03 LAB — BASIC METABOLIC PANEL
Anion gap: 4 — ABNORMAL LOW (ref 5–15)
BUN: 24 mg/dL — ABNORMAL HIGH (ref 8–23)
CHLORIDE: 98 mmol/L (ref 98–111)
CO2: 35 mmol/L — ABNORMAL HIGH (ref 22–32)
Calcium: 8.8 mg/dL — ABNORMAL LOW (ref 8.9–10.3)
Creatinine, Ser: 1.08 mg/dL — ABNORMAL HIGH (ref 0.44–1.00)
GFR calc Af Amer: 59 mL/min — ABNORMAL LOW (ref >60)
GFR calc non Af Amer: 51 mL/min — ABNORMAL LOW (ref >60)
Glucose, Bld: 193 mg/dL — ABNORMAL HIGH (ref 70–99)
POTASSIUM: 4.3 mmol/L (ref 3.5–5.1)
Sodium: 137 mmol/L (ref 135–145)

## 2018-04-03 LAB — MAGNESIUM: Magnesium: 1.9 mg/dL (ref 1.7–2.4)

## 2018-04-03 NOTE — Progress Notes (Signed)
[place] at [place] NAME: [name] [name]    MR#:  [id number]  DATE OF BIRTH:  1945-04-25  SUBJECTIVE:  CHIEF COMPLAINT:   Chief Complaint  Patient presents with  . Weakness  . Rectal Pain  . Leg Pain   No new complaints this morning.  Patient awake and alert this morning and sitting up in bed.  Awaiting discharge to rehab once approved by insurance company.Marland Kitchen  REVIEW OF SYSTEMS:  ROS  Review of system unobtainable due to underlying dementia.  Patient pleasantly confused.  DRUG ALLERGIES:   Allergies  Allergen Reactions  . Haldol [Haloperidol Decanoate] Swelling and Other (See Comments)    Reaction:  Swelling of tongue and blurred vision    . Prednisone Other (See Comments)    Unknown reaction  . Raspberry Swelling and Other (See Comments)    Reaction:  Swelling of lips   . Metformin Diarrhea   VITALS:  Blood pressure 97/65, pulse (!) 57, temperature 97.9 F (36.6 C), temperature source Oral, resp. rate 16, height 5\' 6"  (1.676 m), weight 120.4 kg, SpO2 97 %. PHYSICAL EXAMINATION:  Physical Exam  GENERAL:  73 y.o.-year-old patient sitting up in the bed with no acute distress.   EYES: Pupils equal, round, reactive to light . No scleral icterus. Extraocular muscles intact.  HEENT: Head atraumatic, normocephalic. Oropharynx and nasopharynx clear.  NECK:  Supple, no jugular venous distention. No thyroid enlargement, no tenderness.  LUNGS: Normal breath sounds bilaterally, no wheezing, rales,rhonchi or crepitation. No use of accessory muscles of respiration.  CARDIOVASCULAR: S1, S2 normal. No murmurs, rubs, or gallops.  ABDOMEN: Soft, nontender, nondistended. Bowel sounds present. No organomegaly or mass.  EXTREMITIES: No pedal edema, cyanosis, or clubbing.  NEUROLOGIC: Has advanced Alzheimer's dementia, unable to follow commands for full neuro exam but has no gross neurological deficit. PSYCHIATRIC: Slow to respond.  Limited exam  due to underlying dementia SKIN: No obvious rash, lesion, or ulcer.   LABORATORY PANEL:  Female CBC Recent Labs  Lab 04/02/18 0446  WBC 6.3  HGB 10.9*  HCT 35.4*  PLT 146*   ------------------------------------------------------------------------------------------------------------------ Chemistries  Recent Labs  Lab 04/02/18 0446  NA 140  K 4.1  CL 97*  CO2 38*  GLUCOSE 110*  BUN 22  CREATININE 1.13*  CALCIUM 8.8*  MG 2.0   RADIOLOGY:  No results found. ASSESSMENT AND PLAN:    73 year old female with advanced Alzheimer's dementia, diabetes mellitus type 2, essential hypertension, decubitus ulcers brought in because of generalized weakness, poor p.o. intake.  1.  Acute kidney injury admission due to ATN, improved  2. diabetes mellitus type 2: Continue sliding scale with coverage, started on the diet.  3.  Alzheimer's dementia:, patient already on Depakote, Cogentin, .  Continue them.  Seen by palliative care team, patient is DNR  Patient had MRI of the brain done without contrast which revealed limited motion degraded 4 sequence MRI of the head: No acute intracranial process.  4.  Essential hypertension: Continue present medicines  5.  elvated troponins likely demand ischemia, with maximum troponin of 0.05.   2D echocardiogram done with ejection fraction of 60 to 65%.  6 Right renal mass, highly concerning for renal cell carcinoma, similar size and appearance from previous CAT scans, patient followed by [facility] nephrology, surveillance recommended due to her comorbidities, high risk for surgeries, size and location not ideal for ablation as per documentation from [facility] oncology. Prognosis very poor, due to advanced dementia,  multiple medical problems  7. disposal to SNF once approved by insurance company.  Awaiting preauthorization.  DVT prophylaxis; Lovenox  All the records are reviewed and case discussed with Care Management/Social Worker. Management plans  discussed with the patient, family and they are in agreement.  CODE STATUS: DNR  TOTAL TIME TAKING CARE OF THIS PATIENT: 25 minutes.    I called and updated patient's son Mr. [name] recently on treatment plans and findings on MRI as outlined above.  He agrees with plan of care as outlined.  More than 50% of the time was spent in counseling/coordination of care: YES  POSSIBLE D/C IN 1 DAYS, DEPENDING ON CLINICAL CONDITION.   [name] [name] M.D on 04/03/2018 at 2:06 PM  Between 7am to 6pm - Pager - [phone]  After 6pm go to [email or web address] - Proofreader  [facility]  Office  [phone]  CC: Primary care physician; [name]  Note: This dictation was prepared with Dragon dictation along with smaller phrase technology. Any transcriptional errors that result from this process are unintentional.

## 2018-04-03 NOTE — Progress Notes (Signed)
   04/03/18 2100  Clinical Encounter Type  Visited With Patient  Visit Type Follow-up;Spiritual support  Referral From Nurse  Spiritual Encounters  Spiritual Needs Prayer;Emotional   Chaplain received a call from patient's nurse indicated a desire to talk with a chaplain. Upon arrival, the patient was reclining in bed and watching TV. Patient shared a praise report about her health; she feels better than when she arrived and desires to grow stronger each day. Patient requested prayer for herself, her health, her sons, and family. She states that she desires "to go home" and became teary as she stated this. Chaplain offered words of affirmation, comfort, and encouragement. Patient indicates that her son wishes for her to "get stronger" before she goes home. Chaplain offered to lift patient's concerns in prayer and reminded her of some of the past victories she has shared with this chaplain. She seemed encouraged in this and joined me in sharing her strong faith built challenges and triumphs in life. Patient and chaplain prayed together for the aforementioned concerns.

## 2018-04-03 NOTE — Care Management Important Message (Signed)
Copy of signed Medicare IM left with patient in room. 

## 2018-04-03 NOTE — Progress Notes (Signed)
RN made aware that pt ran 6 beats of VT. When RN went to assess pt she was resting and asymptomatic. MD notified, verbal orders for BMP and Mag lab check at this time. RN will continue to monitor pt.   Ilianna Bown [employer]

## 2018-04-03 NOTE — Clinical Social Work Note (Signed)
CSW informed that [facility] was now unable to accept patient. CSW extended offers to patient's son again today and he chose [facility] in [place]. CSW spoke with admissions at [facility] and they are aware. CSW contacted insurance, [facility], and was informed by Jeannett Senior that they did not have a request for SNF in their system. CSW initiated another prior request and has sent PT note from today to insurance.  Shela Leff MSW,LCSW [phone]

## 2018-04-03 NOTE — Progress Notes (Signed)
   04/03/18 1500  Clinical Encounter Type  Visited With Patient  Visit Type Follow-up;Spiritual support  Referral From Chaplain  Consult/Referral To [facility] was rounding and stop to visit patient. The tech in the room was expressing to the Chaplain how patient was upset because she felled and her son possibly told her she was going to a rehab. Patient was very upset and emotional. The tech felt it was a great time to visit. Chaplain went in and patient immediately express someone was trying to not let her go home. Chaplain was able to comfort patient with encouraging words on focusing on getting better so she can go home. Patient seem to be more at peace. Chaplain prayed with patient and told her she would check with her tomorrow.

## 2018-04-03 NOTE — Progress Notes (Signed)
Physical Therapy Treatment Patient Details Name: [name] [name] MRN: [id number] DOB: November 25, 1945 Today's Date: 04/03/2018    History of Present Illness Pt is a 73 year old female admitted for elevated troponin and decubitus ulcer of sacral region, stage 1.  PMH includes morbid obesity, DM ll, COPD, HLD, OSA and psychosis.    PT Comments    Pt initially agreeable to PT; denies pain. Pt participates in limited BLE exercises requiring assist and some increased instruction/tactile cues for proper technique. During supine exercises, pt learns (from a family member/caretaker) that she will not be going home, but to rehab. Caretaker had to leave. Pt becomes visibly upset and disengages from any further therapy or conversation with therapist despite efforts. Chaplain comes to room and informed of recent event. Chaplain to speak with and offer additional patient support. Pt very weak throughout body. Continue PT to progress participation, strength and endurance to improve all functional mobility.   Follow Up Recommendations  SNF     Equipment Recommendations       Recommendations for Other Services       Precautions / Restrictions Precautions Precautions: Fall Restrictions Weight Bearing Restrictions: No    Mobility  Bed Mobility               General bed mobility comments: not tested  Transfers                    Ambulation/Gait                 Stairs             Wheelchair Mobility    Modified Rankin (Stroke Patients Only)       Balance                                            Cognition Arousal/Alertness: Awake/alert Behavior During Therapy: WFL for tasks assessed/performed(Becomes upset/withdraws) Overall Cognitive Status: Within Functional Limits for tasks assessed                                        Exercises General Exercises - Lower Extremity Ankle Circles/Pumps: AROM;Both;10 reps;Supine Quad  Sets: Strengthening;Both;10 reps;Supine Gluteal Sets: Other (comment)(attempted; does not grasp concept) Short Arc Quad: AROM;Both;10 reps;Supine Heel Slides: AAROM;Both;10 reps;Supine Hip ABduction/ADduction: Other (comment);AAROM;PROM(pt withdraws from participation with news of SNF placement)    General Comments        Pertinent Vitals/Pain Pain Assessment: No/denies pain    Home Living                      Prior Function            PT Goals (current goals can now be found in the care plan section) Progress towards PT goals: Not progressing toward goals - comment    Frequency    Min 2X/week      PT Plan Current plan remains appropriate    Co-evaluation              AM-PAC PT "6 Clicks" Mobility   Outcome Measure  Help needed turning from your back to your side while in a flat bed without using bedrails?: A Lot Help needed moving from lying on your back to sitting on the side  of a flat bed without using bedrails?: A Lot Help needed moving to and from a bed to a chair (including a wheelchair)?: A Lot Help needed standing up from a chair using your arms (e.g., wheelchair or bedside chair)?: Total Help needed to walk in hospital room?: Total Help needed climbing 3-5 steps with a railing? : Total 6 Click Score: 9    End of Session   Activity Tolerance: Other (comment)(weak; self limits) Patient left: in bed;with call bell/phone within reach;with bed alarm set;Other (comment)(Chaplain in room)   PT Visit Diagnosis: Muscle weakness (generalized) (M62.81);Difficulty in walking, not elsewhere classified (R26.2)     Time: 8718-3672 PT Time Calculation (min) (ACUTE ONLY): 19 min  Charges:  $Therapeutic Exercise: 8-22 mins                      Larae Grooms, PTA 04/03/2018, 2:51 PM

## 2018-04-04 DIAGNOSIS — F0390 Unspecified dementia without behavioral disturbance: Secondary | ICD-10-CM

## 2018-04-04 LAB — GLUCOSE, CAPILLARY
Glucose-Capillary: 147 mg/dL — ABNORMAL HIGH (ref 70–99)
Glucose-Capillary: 206 mg/dL — ABNORMAL HIGH (ref 70–99)

## 2018-04-04 MED ORDER — ENSURE MAX PROTEIN PO LIQD: 11.0000 [oz_av] | Freq: Two times a day (BID) | ORAL | 0 refills | Status: DC

## 2018-04-04 MED ORDER — OXYCODONE HCL 5 MG PO TABS: 5.0000 mg | ORAL_TABLET | Freq: Four times a day (QID) | ORAL | 0 refills | Status: DC | PRN

## 2018-04-04 NOTE — Progress Notes (Signed)
Palliative: [name] [name] is resting quietly in bed.  She greets me and somewhat keeps eye contact.  She appears weak and chronically ill.  She is able to communicate her needs, and is able to take a sip of liquid without overt signs of aspiration.  I am not sure that she would be able to independently reach for something to drink.  There is no family at bedside at this time.  [name] [name] had been accepted to [facility], and should transfer today.  Call to son, [name] [name] at [phone].  Left voicemail message relating to disposition plan to [facility] via ambulance today, MRI results degraded due to motion, resource for understanding dementia pathway " Coach Broyles play book".  30 minutes  Quinn Axe, NP  Palliative Medicine Team  Team Phone # [phone]  Greater than 50% of this time was spent counseling and coordinating care related to the above assessment and plan.

## 2018-04-04 NOTE — Clinical Social Work Note (Signed)
Physician to discharge patient today. CSW has spoken to [place] at [facility] and they can take patient today. Discharge information has been sent and patient's son has been notified. Patient to transport via EMS. Shela Leff MSW,LCSW [phone]

## 2018-04-04 NOTE — Progress Notes (Signed)
Patient cleared for discharge. Family notified by Education officer, museum, patient accepted at [facility] and will be transferred there today. Report called, spoke to [place] at [facility]. EMS called for non-emergent transport. IV removed. Gown changed. Belongings gathered. Prescription in patient folder to be taken to facility. Vitals taken. D/c to [facility] via EMS.

## 2018-04-04 NOTE — Discharge Summary (Signed)
[facility] NAME: [name] [name]    MR#:  [id number]  DATE OF BIRTH:  08-11-1945  DATE OF ADMISSION:  03/26/2018   ADMITTING PHYSICIAN: Arta Silence, MD  DATE OF DISCHARGE: 04/04/2018  PRIMARY CARE PHYSICIAN: [facility], Jefm Bryant   ADMISSION DIAGNOSIS:  Dehydration [E86.0] Elevated troponin [R79.89] Generalized weakness [R53.1] Decubitus ulcer of sacral region, stage 1 [L89.151] DISCHARGE DIAGNOSIS:  Active Problems:   Elevated troponin   Decubitus ulcer of sacral region, stage 1   Goals of care, counseling/discussion   Palliative care by specialist   Dehydration  SECONDARY DIAGNOSIS:   Past Medical History:  Diagnosis Date  . Anxiety   . Arthritis   . Bronchitis   . Bursitis   . CAD (coronary artery disease)   . CHF (congestive heart failure) ([place])   . Chronic cough   . Chronic kidney disease    shadow on x-ray  . COPD (chronic obstructive pulmonary disease) ([place])   . Depression   . DM2 (diabetes mellitus, type 2) ([place])   . Environmental allergies   . GERD (gastroesophageal reflux disease)   . Hypercholesteremia   . Hypertension   . Left ventricular outflow tract obstruction    a. echo 03/2014: EF 60-65%, hypernamic LV systolic fxn, mod LVH w/ LVOT gradient estimated at 68 mm Hg w/ valsalva, very small LV internal cavity size, mildly increased LV posterior wall thickness, mild Ao valve scl w/o stenosis, diastolic dysfunction, normal RVSP  . Lower extremity edema   . LVH (left ventricular hypertrophy)    a. echo suggests long standing uncontrolled htn. she will not do well when dehydrated, LV cavity obliteration  . Muscle weakness   . Obesity   . On supplemental oxygen therapy    AS NEEDED  . OSA (obstructive sleep apnea)    does not use machine  . Osteoarthritis   . Schizophrenia ([place])   . Tremors of nervous system   . Wheezing    HOSPITAL COURSE:  Chief complaint; generalized  weakness  History of presenting complaint; [name] [name]  is a 73 y.o. female with a known history of morbid obesity, T2IDDM, HTN, HLD, COPD, OSA, chronic diastolic CHF (EF > 65%, grade 1 diastolic dysfxn as of 68/01/7516 Echo), psychosis p/w generalized weakness. Pt was unable to provide any Hx/ROS, other than stating that she was hungry and thirsty. She appears weak. Her mouth is dry. She has stage 1 decubitus ulcers on her buttocks. Trop-I 0.05.  Patient was admitted to medical service for further evaluation.  Please refer to the H&P dictated for further details.  Hospital course;  1. Acute kidney injury admission due to ATN, improved  2. diabetes mellitus type 2: Continue home regimen.  Follow-up with primary care physician or MD at rehab for monitoring of blood sugars and adjustment of regimen.    3. Alzheimer's dementia:, patient already on Depakote, Cogentin, . Continue them. Seen by palliative care team, patient is DNR.  Universal DNR already signed and placed in the chart by preceding physician. Patient had MRI of the brain done without contrast which revealed limited motion degraded 4 sequence MRI of the head: No acute intracranial process.  4. Essential hypertension: Continue present medicines  5. elvated troponins likely demand ischemia, with maximum troponin of 0.05.   2D echocardiogram done with ejection fraction of 60 to 65%.  6 Right renal mass, highly concerning for renal cell carcinoma, similar size and appearance from previous CAT  scans, patient followed by [facility] nephrology, surveillance recommended due to her comorbidities, high risk for surgeries, size and location not ideal for ablation as per documentation from [facility] oncology. Prognosis very poor, due to advanced dementia, multiple medical problems  7.  Left shoulder pain  Nursing staff reported some left shoulder discomfort.  Likely related to moving patient around in bed.  Patient already on PRN oxycodone.   Prescription given.  Use heating pad as needed for symptomatic management.  MD at rehab to reevaluate for clinical resolution  Disposition; patient being discharged to skilled nursing facility. I previously updated patient's son on treatment and discharge plans and all questions were answered.   DISCHARGE CONDITIONS:  Stable CONSULTS OBTAINED:  Treatment Team:  Arta Silence, MD DRUG ALLERGIES:   Allergies  Allergen Reactions  . Haldol [Haloperidol Decanoate] Swelling and Other (See Comments)    Reaction:  Swelling of tongue and blurred vision    . Prednisone Other (See Comments)    Unknown reaction  . Raspberry Swelling and Other (See Comments)    Reaction:  Swelling of lips   . Metformin Diarrhea   DISCHARGE MEDICATIONS:   Allergies as of 04/04/2018      Reactions   Haldol [haloperidol Decanoate] Swelling, Other (See Comments)   Reaction:  Swelling of tongue and blurred vision     Prednisone Other (See Comments)   Unknown reaction   Raspberry Swelling, Other (See Comments)   Reaction:  Swelling of lips    Metformin Diarrhea      Medication List    STOP taking these medications   furosemide 20 MG tablet Commonly known as:  LASIX     TAKE these medications   acetaminophen 325 MG tablet Commonly known as:  TYLENOL Take 2 tablets (650 mg total) by mouth every 4 (four) hours as needed for mild pain (temp > 101.5).   albuterol 108 (90 Base) MCG/ACT inhaler Commonly known as:  PROVENTIL HFA;VENTOLIN HFA Inhale 2 puffs into the lungs every 6 (six) hours as needed for wheezing or shortness of breath.   aspirin EC 81 MG tablet Take 81 mg by mouth daily.   atorvastatin 20 MG tablet Commonly known as:  LIPITOR Take 20 mg by mouth at bedtime.   benztropine 0.5 MG tablet Commonly known as:  COGENTIN Take 0.5 mg by mouth 2 (two) times daily.   carvedilol 12.5 MG tablet Commonly known as:  COREG Take 1 tablet (12.5 mg total) by mouth 2 (two) times daily.    cetirizine 10 MG tablet Commonly known as:  ZYRTEC Take 10 mg by mouth daily.   cholecalciferol 1000 units tablet Commonly known as:  VITAMIN D Take 1,000 Units by mouth daily.   divalproex 500 MG DR tablet Commonly known as:  DEPAKOTE Take 1 tablet (500 mg total) by mouth 3 (three) times daily.   docusate sodium 100 MG capsule Commonly known as:  COLACE Take 100 mg by mouth 2 (two) times daily.   ENSURE MAX PROTEIN Liqd Take 330 mLs (11 oz total) by mouth 2 (two) times daily.   famotidine 20 MG tablet Commonly known as:  PEPCID Take 20 mg by mouth 2 (two) times daily.   fluPHENAZine 5 MG/ML solution Commonly known as:  PROLIXIN Take 5 mg by mouth 2 (two) times daily.   Fluticasone-Salmeterol 250-50 MCG/DOSE Aepb Commonly known as:  ADVAIR Inhale 1 puff into the lungs 2 (two) times daily.   INCRUSE ELLIPTA 62.5 MCG/INH Aepb Generic drug:  umeclidinium bromide  Inhale 1 puff into the lungs daily.   insulin aspart 100 UNIT/ML injection Commonly known as:  novoLOG Inject 4 Units into the skin 4 (four) times daily.   insulin glargine 100 UNIT/ML injection Commonly known as:  LANTUS Inject 11 Units into the skin at bedtime.   levothyroxine 75 MCG tablet Commonly known as:  SYNTHROID, LEVOTHROID Take 1 tablet by mouth daily.   losartan 25 MG tablet Commonly known as:  COZAAR Take 25 mg by mouth daily.   nitroGLYCERIN 0.4 MG SL tablet Commonly known as:  NITROSTAT Place 0.4 mg under the tongue every 5 (five) minutes as needed for chest pain.   oxyCODONE 5 MG immediate release tablet Commonly known as:  Oxy IR/ROXICODONE Take 1 tablet (5 mg total) by mouth every 6 (six) hours as needed for moderate pain or severe pain.   sitaGLIPtin 50 MG tablet Commonly known as:  JANUVIA Take 50 mg by mouth daily.   SM MELATONIN 3 MG Tabs Generic drug:  Melatonin Take 3 mg by mouth at bedtime as needed (sleep).        DISCHARGE INSTRUCTIONS:   DIET:  Diabetic  diet DISCHARGE CONDITION:  Stable ACTIVITY:  Activity as tolerated OXYGEN:  Home Oxygen: No.  Oxygen Delivery: room air DISCHARGE LOCATION:  nursing home   If you experience worsening of your admission symptoms, develop shortness of breath, life threatening emergency, suicidal or homicidal thoughts you must seek medical attention immediately by calling 911 or calling your MD immediately  if symptoms less severe.  You Must read complete instructions/literature along with all the possible adverse reactions/side effects for all the Medicines you take and that have been prescribed to you. Take any new Medicines after you have completely understood and accpet all the possible adverse reactions/side effects.   Please note  You were cared for by a hospitalist during your hospital stay. If you have any questions about your discharge medications or the care you received while you were in the hospital after you are discharged, you can call the unit and asked to speak with the hospitalist on call if the hospitalist that took care of you is not available. Once you are discharged, your primary care physician will handle any further medical issues. Please note that NO REFILLS for any discharge medications will be authorized once you are discharged, as it is imperative that you return to your primary care physician (or establish a relationship with a primary care physician if you do not have one) for your aftercare needs so that they can reassess your need for medications and monitor your lab values.    On the day of Discharge:  VITAL SIGNS:  Blood pressure (!) 169/103, pulse 63, temperature 98.2 F (36.8 C), temperature source Oral, resp. rate 20, height 5\' 6"  (1.676 m), weight 120.4 kg, SpO2 94 %. PHYSICAL EXAMINATION:   GENERAL:73 y.o.-year-old patient sitting up in the bed with no acute distress.  EYES: Pupils equal, round, reactive to light . No scleral icterus. Extraocular muscles intact.   HEENT: Head atraumatic, normocephalic. Oropharynx and nasopharynx clear.  NECK: Supple, no jugular venous distention. No thyroid enlargement, no tenderness.  LUNGS: Normal breath sounds bilaterally, no wheezing, rales,rhonchi or crepitation. No use of accessory muscles of respiration.  CARDIOVASCULAR: S1, S2 normal. No murmurs, rubs, or gallops.  ABDOMEN: Soft, nontender, nondistended. Bowel sounds present. No organomegaly or mass.  EXTREMITIES: No pedal edema, cyanosis, or clubbing.  NEUROLOGIC: Has advanced Alzheimer's dementia, unable to follow commands for full  neuro exam but has no gross neurological deficit. PSYCHIATRIC: Slow to respond.  Limited exam due to underlying dementia SKIN: No obvious rash, lesion   DATA REVIEW:   CBC Recent Labs  Lab 04/02/18 0446  WBC 6.3  HGB 10.9*  HCT 35.4*  PLT 146*    Chemistries  Recent Labs  Lab 04/03/18 1558  NA 137  K 4.3  CL 98  CO2 35*  GLUCOSE 193*  BUN 24*  CREATININE 1.08*  CALCIUM 8.8*  MG 1.9     Microbiology Results  Results for orders placed or performed during the hospital encounter of 02/14/18  Urine culture     Status: Abnormal   Collection Time: 02/14/18 11:06 AM  Result Value Ref Range Status   Specimen Description   Final    URINE, RANDOM Performed at [facility], [address], [place], [place] [address]    Special Requests   Final    NONE Performed at [facility], [address] [place], [place] [address]    Culture >=100,000 COLONIES/mL ESCHERICHIA COLI (A)  Final   Report Status 02/16/2018 FINAL  Final   Organism ID, Bacteria ESCHERICHIA COLI (A)  Final      Susceptibility   Escherichia coli - MIC*    AMPICILLIN >=32 RESISTANT Resistant     CEFAZOLIN <=4 SENSITIVE Sensitive     CEFTRIAXONE <=1 SENSITIVE Sensitive     CIPROFLOXACIN <=0.25 SENSITIVE Sensitive     GENTAMICIN <=1 SENSITIVE Sensitive     IMIPENEM <=0.25 SENSITIVE Sensitive     NITROFURANTOIN <=16  SENSITIVE Sensitive     TRIMETH/SULFA >=320 RESISTANT Resistant     AMPICILLIN/SULBACTAM 16 INTERMEDIATE Intermediate     PIP/TAZO <=4 SENSITIVE Sensitive     Extended ESBL NEGATIVE Sensitive     * >=100,000 COLONIES/mL ESCHERICHIA COLI    RADIOLOGY:  No results found.   Management plans discussed with the patient, family and they are in agreement.  CODE STATUS: DNR   TOTAL TIME TAKING CARE OF THIS PATIENT: 39 minutes.    Sekou Zuckerman M.D on 04/04/2018 at 10:26 AM  Between 7am to 6pm - Pager - [phone]  After 6pm go to [email or web address] - Proofreader  [facility]  Office  [phone]  CC: Primary care physician; [name]   Note: This dictation was prepared with Dragon dictation along with smaller phrase technology. Any transcriptional errors that result from this process are unintentional.

## 2018-04-04 NOTE — Clinical Social Work Placement (Signed)
   CLINICAL SOCIAL WORK PLACEMENT  NOTE  Date:  04/04/2018  Patient Details  Name: [name] [name] MRN: [id number] Date of Birth: [birth year]  Clinical Social Work is seeking post-discharge placement for this patient at the [place] level of care (*CSW will initial, date and re-position this form in  chart as items are completed):  Yes   Patient/family provided with [facility] Work Department's list of facilities offering this level of care within the geographic area requested by the patient (or if unable, by the patient's family).  Yes   Patient/family informed of their freedom to choose among providers that offer the needed level of care, that participate in Medicare, Medicaid or managed care program needed by the patient, have an available bed and are willing to accept the patient.  Yes   Patient/family informed of [facility]'s ownership interest in [facility] and [facility], as well as of the fact that they are under no obligation to receive care at these facilities.  PASRR submitted to EDS on 03/30/18     PASRR number received on       Existing PASRR number confirmed on 03/30/18     FL2 transmitted to all facilities in geographic area requested by pt/family on 03/30/18     FL2 transmitted to all facilities within larger geographic area on       Patient informed that his/her managed care company has contracts with or will negotiate with certain facilities, including the following:        Yes   Patient/family informed of bed offers received.  Patient chooses bed at [facility])     Physician recommends and patient chooses bed at [facility])    Patient to be transferred to [facility]) on 04/04/18.  Patient to be transferred to facility by (EMS)     Patient family notified on 04/04/18 of transfer.  Name of family member notified:  Rosalin Hawking)     PHYSICIAN       Additional Comment:     _______________________________________________ Shela Leff, LCSW 04/04/2018, 11:28 AM

## 2018-06-28 DIAGNOSIS — F209 Schizophrenia, unspecified: Secondary | ICD-10-CM | POA: Diagnosis not present

## 2018-06-28 DIAGNOSIS — F419 Anxiety disorder, unspecified: Secondary | ICD-10-CM | POA: Diagnosis not present

## 2018-06-28 DIAGNOSIS — J449 Chronic obstructive pulmonary disease, unspecified: Secondary | ICD-10-CM | POA: Diagnosis not present

## 2018-06-28 DIAGNOSIS — R4189 Other symptoms and signs involving cognitive functions and awareness: Secondary | ICD-10-CM | POA: Diagnosis not present

## 2018-06-28 DIAGNOSIS — R451 Restlessness and agitation: Secondary | ICD-10-CM | POA: Diagnosis not present

## 2018-07-23 DIAGNOSIS — J449 Chronic obstructive pulmonary disease, unspecified: Secondary | ICD-10-CM | POA: Diagnosis not present

## 2018-07-23 DIAGNOSIS — I509 Heart failure, unspecified: Secondary | ICD-10-CM | POA: Diagnosis not present

## 2018-07-23 DIAGNOSIS — I10 Essential (primary) hypertension: Secondary | ICD-10-CM | POA: Diagnosis not present

## 2018-07-23 DIAGNOSIS — K262 Acute duodenal ulcer with both hemorrhage and perforation: Secondary | ICD-10-CM | POA: Diagnosis not present

## 2018-07-24 ENCOUNTER — Encounter: Payer: Self-pay | Admitting: Medical Oncology

## 2018-07-24 ENCOUNTER — Emergency Department
Admission: EM | Admit: 2018-07-24 | Discharge: 2018-07-25 | Disposition: A | Discharge status: Other Acute Inpatient Hospital | Payer: Medicare HMO | Attending: Internal Medicine | Admitting: Internal Medicine

## 2018-07-24 DIAGNOSIS — I110 Hypertensive heart disease with heart failure: Secondary | ICD-10-CM | POA: Insufficient documentation

## 2018-07-24 DIAGNOSIS — Z87891 Personal history of nicotine dependence: Secondary | ICD-10-CM | POA: Insufficient documentation

## 2018-07-24 DIAGNOSIS — F329 Major depressive disorder, single episode, unspecified: Secondary | ICD-10-CM | POA: Diagnosis not present

## 2018-07-24 DIAGNOSIS — R0789 Other chest pain: Secondary | ICD-10-CM | POA: Diagnosis not present

## 2018-07-24 DIAGNOSIS — N3000 Acute cystitis without hematuria: Secondary | ICD-10-CM | POA: Diagnosis not present

## 2018-07-24 DIAGNOSIS — F05 Delirium due to known physiological condition: Secondary | ICD-10-CM | POA: Diagnosis not present

## 2018-07-24 DIAGNOSIS — I2510 Atherosclerotic heart disease of native coronary artery without angina pectoris: Secondary | ICD-10-CM | POA: Diagnosis not present

## 2018-07-24 DIAGNOSIS — N189 Chronic kidney disease, unspecified: Secondary | ICD-10-CM | POA: Insufficient documentation

## 2018-07-24 DIAGNOSIS — I491 Atrial premature depolarization: Secondary | ICD-10-CM | POA: Diagnosis not present

## 2018-07-24 DIAGNOSIS — F29 Unspecified psychosis not due to a substance or known physiological condition: Secondary | ICD-10-CM | POA: Diagnosis not present

## 2018-07-24 DIAGNOSIS — R4182 Altered mental status, unspecified: Secondary | ICD-10-CM | POA: Diagnosis present

## 2018-07-24 DIAGNOSIS — R079 Chest pain, unspecified: Secondary | ICD-10-CM | POA: Diagnosis not present

## 2018-07-24 DIAGNOSIS — M25519 Pain in unspecified shoulder: Secondary | ICD-10-CM | POA: Diagnosis not present

## 2018-07-24 DIAGNOSIS — I509 Heart failure, unspecified: Secondary | ICD-10-CM | POA: Diagnosis not present

## 2018-07-24 DIAGNOSIS — I10 Essential (primary) hypertension: Secondary | ICD-10-CM | POA: Diagnosis not present

## 2018-07-24 LAB — URINALYSIS, COMPLETE (UACMP) WITH MICROSCOPIC
Bilirubin Urine: NEGATIVE
Glucose, UA: NEGATIVE mg/dL
Ketones, ur: NEGATIVE mg/dL
Nitrite: POSITIVE — AB
Protein, ur: 30 mg/dL — AB
Specific Gravity, Urine: 1.017 (ref 1.005–1.030)
WBC, UA: >50 WBC/hpf — ABNORMAL HIGH (ref 0–5)
pH: 5 (ref 5.0–8.0)

## 2018-07-24 LAB — CBC WITH DIFFERENTIAL/PLATELET
Abs Immature Granulocytes: 0.04 10*3/uL (ref 0.00–0.07)
Basophils Absolute: 0 10*3/uL (ref 0.0–0.1)
Basophils Relative: 0 %
Eosinophils Absolute: 0.1 10*3/uL (ref 0.0–0.5)
Eosinophils Relative: 1 %
HCT: 44.8 % (ref 36.0–46.0)
Hemoglobin: 14.1 g/dL (ref 12.0–15.0)
Immature Granulocytes: 0 %
Lymphocytes Relative: 45 %
Lymphs Abs: 4.2 10*3/uL — ABNORMAL HIGH (ref 0.7–4.0)
MCH: 29.7 pg (ref 26.0–34.0)
MCHC: 31.5 g/dL (ref 30.0–36.0)
MCV: 94.3 fL (ref 80.0–100.0)
Monocytes Absolute: 1.2 10*3/uL — ABNORMAL HIGH (ref 0.1–1.0)
Monocytes Relative: 13 %
Neutro Abs: 3.9 10*3/uL (ref 1.7–7.7)
Neutrophils Relative %: 41 %
Platelets: 233 10*3/uL (ref 150–400)
RBC: 4.75 MIL/uL (ref 3.87–5.11)
RDW: 14.2 % (ref 11.5–15.5)
WBC: 9.5 10*3/uL (ref 4.0–10.5)
nRBC: 0 % (ref 0.0–0.2)

## 2018-07-24 LAB — COMPREHENSIVE METABOLIC PANEL
ALT: 11 U/L (ref 0–44)
AST: 20 U/L (ref 15–41)
Albumin: 3.5 g/dL (ref 3.5–5.0)
Alkaline Phosphatase: 59 U/L (ref 38–126)
Anion gap: 8 (ref 5–15)
BUN: 19 mg/dL (ref 8–23)
CO2: 27 mmol/L (ref 22–32)
Calcium: 8.7 mg/dL — ABNORMAL LOW (ref 8.9–10.3)
Chloride: 105 mmol/L (ref 98–111)
Creatinine, Ser: 0.94 mg/dL (ref 0.44–1.00)
GFR calc Af Amer: >60 mL/min (ref >60)
GFR calc non Af Amer: >60 mL/min (ref >60)
Glucose, Bld: 142 mg/dL — ABNORMAL HIGH (ref 70–99)
Potassium: 4.2 mmol/L (ref 3.5–5.1)
Sodium: 140 mmol/L (ref 135–145)
Total Bilirubin: 0.5 mg/dL (ref 0.3–1.2)
Total Protein: 7.5 g/dL (ref 6.5–8.1)

## 2018-07-24 LAB — TROPONIN I: Troponin I: <0.03 ng/mL (ref <0.03)

## 2018-07-24 LAB — GLUCOSE, CAPILLARY
Glucose-Capillary: 130 mg/dL — ABNORMAL HIGH (ref 70–99)
Glucose-Capillary: 226 mg/dL — ABNORMAL HIGH (ref 70–99)

## 2018-07-24 MED ORDER — UMECLIDINIUM BROMIDE 62.5 MCG/INH IN AEPB
1.0000 | INHALATION_SPRAY | Freq: Every day | RESPIRATORY_TRACT | Status: DC
  Administered 2018-07-25: 1 via RESPIRATORY_TRACT
  Filled 2018-07-24: qty 7

## 2018-07-24 MED ORDER — BENZTROPINE MESYLATE 1 MG PO TABS
0.5000 mg | ORAL_TABLET | Freq: Two times a day (BID) | ORAL | Status: DC
  Administered 2018-07-24 – 2018-07-25 (×2): 0.5 mg via ORAL
  Filled 2018-07-24 (×2): qty 1

## 2018-07-24 MED ORDER — LINAGLIPTIN 5 MG PO TABS
5.0000 mg | ORAL_TABLET | Freq: Every day | ORAL | Status: DC
  Administered 2018-07-25: 5 mg via ORAL
  Filled 2018-07-24: qty 1

## 2018-07-24 MED ORDER — LEVOTHYROXINE SODIUM 50 MCG PO TABS
75.0000 ug | ORAL_TABLET | Freq: Every day | ORAL | Status: DC
  Administered 2018-07-25: 75 ug via ORAL
  Filled 2018-07-24: qty 2

## 2018-07-24 MED ORDER — INSULIN GLARGINE 100 UNIT/ML ~~LOC~~ SOLN
11.0000 [IU] | Freq: Every day | SUBCUTANEOUS | Status: DC
  Administered 2018-07-25: 11 [IU] via SUBCUTANEOUS
  Filled 2018-07-24 (×2): qty 0.11

## 2018-07-24 MED ORDER — ASPIRIN EC 81 MG PO TBEC
81.0000 mg | DELAYED_RELEASE_TABLET | Freq: Every day | ORAL | Status: DC
  Administered 2018-07-25: 81 mg via ORAL
  Filled 2018-07-24: qty 1

## 2018-07-24 MED ORDER — SODIUM CHLORIDE 0.9 % IV SOLN
1.0000 g | Freq: Once | INTRAVENOUS | Status: AC
  Administered 2018-07-24: 1 g via INTRAVENOUS
  Filled 2018-07-24: qty 10

## 2018-07-24 MED ORDER — CEPHALEXIN 500 MG PO CAPS
500.0000 mg | ORAL_CAPSULE | Freq: Four times a day (QID) | ORAL | Status: DC
  Administered 2018-07-24 – 2018-07-25 (×4): 500 mg via ORAL
  Filled 2018-07-24 (×3): qty 1

## 2018-07-24 MED ORDER — LORATADINE 10 MG PO TABS
10.0000 mg | ORAL_TABLET | Freq: Every day | ORAL | Status: DC
  Administered 2018-07-25: 10 mg via ORAL
  Filled 2018-07-24: qty 1

## 2018-07-24 MED ORDER — CEPHALEXIN 500 MG PO CAPS: 500.0000 mg | ORAL_CAPSULE | Freq: Four times a day (QID) | ORAL | 0 refills | Status: AC

## 2018-07-24 MED ORDER — LORAZEPAM 2 MG/ML IJ SOLN
1.0000 mg | Freq: Once | INTRAMUSCULAR | Status: AC
  Administered 2018-07-24: 1 mg via INTRAVENOUS
  Filled 2018-07-24: qty 1

## 2018-07-24 MED ORDER — ACETAMINOPHEN 325 MG PO TABS: 650.0000 mg | ORAL_TABLET | ORAL | Status: DC | PRN

## 2018-07-24 MED ORDER — LOSARTAN POTASSIUM 50 MG PO TABS
25.0000 mg | ORAL_TABLET | Freq: Every day | ORAL | Status: DC
  Administered 2018-07-25: 25 mg via ORAL
  Filled 2018-07-24: qty 1

## 2018-07-24 MED ORDER — ATORVASTATIN CALCIUM 20 MG PO TABS
20.0000 mg | ORAL_TABLET | Freq: Every day | ORAL | Status: DC
  Administered 2018-07-24: 20 mg via ORAL
  Filled 2018-07-24: qty 1

## 2018-07-24 MED ORDER — VITAMIN D 25 MCG (1000 UNIT) PO TABS
1000.0000 [IU] | ORAL_TABLET | Freq: Every day | ORAL | Status: DC
  Administered 2018-07-25: 1000 [IU] via ORAL
  Filled 2018-07-24: qty 1

## 2018-07-24 MED ORDER — CARVEDILOL 6.25 MG PO TABS
12.5000 mg | ORAL_TABLET | Freq: Two times a day (BID) | ORAL | Status: DC
  Administered 2018-07-24 – 2018-07-25 (×2): 12.5 mg via ORAL
  Filled 2018-07-24 (×2): qty 2

## 2018-07-24 MED ORDER — INSULIN ASPART 100 UNIT/ML ~~LOC~~ SOLN
4.0000 [IU] | Freq: Four times a day (QID) | SUBCUTANEOUS | Status: DC
  Administered 2018-07-24 – 2018-07-25 (×2): 4 [IU] via SUBCUTANEOUS
  Filled 2018-07-24 (×2): qty 1

## 2018-07-24 MED ORDER — ALBUTEROL SULFATE (2.5 MG/3ML) 0.083% IN NEBU: 5.0000 mg | INHALATION_SOLUTION | Freq: Four times a day (QID) | RESPIRATORY_TRACT | Status: DC | PRN

## 2018-07-24 MED ORDER — MELATONIN 5 MG PO TABS
5.0000 mg | ORAL_TABLET | Freq: Every evening | ORAL | Status: DC | PRN
  Filled 2018-07-24: qty 1

## 2018-07-24 NOTE — ED Provider Notes (Signed)
[facility] Emergency Department Provider Note       Time seen: ----------------------------------------- 1:19 PM on 07/24/2018 -----------------------------------------   I have reviewed the triage vital signs and the nursing notes.  HISTORY   Chief Complaint Chest Pain and Shoulder Pain   HPI [name] [name] is a 73 y.o. female with a history of anxiety, bronchitis, coronary artery disease, CHF, chronic kidney disease, depression, diabetes, hyperlipidemia, hypertension, schizophrenia who presents to the ED for chest pain and concerns about her nursing home.  Patient typically calls EMS multiple times a day but this is usually managed by her nursing home.  She was able to call directly which is the reason she was brought in today.  She complains of left chest pain and has had a recent fall.  Past Medical History:  Diagnosis Date  . Anxiety   . Arthritis   . Bronchitis   . Bursitis   . CAD (coronary artery disease)   . CHF (congestive heart failure) ([place])   . Chronic cough   . Chronic kidney disease    shadow on x-ray  . COPD (chronic obstructive pulmonary disease) ([place])   . Depression   . DM2 (diabetes mellitus, type 2) ([place])   . Environmental allergies   . GERD (gastroesophageal reflux disease)   . Hypercholesteremia   . Hypertension   . Left ventricular outflow tract obstruction    a. echo 03/2014: EF 60-65%, hypernamic LV systolic fxn, mod LVH w/ LVOT gradient estimated at 68 mm Hg w/ valsalva, very small LV internal cavity size, mildly increased LV posterior wall thickness, mild Ao valve scl w/o stenosis, diastolic dysfunction, normal RVSP  . Lower extremity edema   . LVH (left ventricular hypertrophy)    a. echo suggests long standing uncontrolled htn. she will not do well when dehydrated, LV cavity obliteration  . Muscle weakness   . Obesity   . On supplemental oxygen therapy    AS NEEDED  . OSA (obstructive sleep apnea)    does not use  machine  . Osteoarthritis   . Schizophrenia ([place])   . Tremors of nervous system   . Wheezing     Patient Active Problem List   Diagnosis Date Noted  . Dementia without behavioral disturbance ([place])   . Dehydration   . Decubitus ulcer of sacral region, stage 1   . Goals of care, counseling/discussion   . Palliative care by specialist   . Elevated troponin 03/27/2018  . AMS (altered mental status) 02/14/2018  . Pressure injury of skin 11/08/2017  . Acute and chronic respiratory failure with hypercapnia ([place]) 11/05/2017  . Acute renal failure (ARF) ([place]) 08/05/2017  . ARF (acute renal failure) ([place]) 08/03/2017  . UTI (urinary tract infection) 01/16/2016  . Altered mental status 01/16/2016  . Metabolic encephalopathy   . Acute on chronic respiratory failure with hypoxia and hypercapnia ([place]) 10/13/2015  . Involuntary commitment 09/02/2015  . Schizoaffective disorder, bipolar type ([place])   . Urinary incontinence 10/30/2014  . GERD (gastroesophageal reflux disease) 10/30/2014  . OSA (obstructive sleep apnea) 10/30/2014  . Osteoarthrosis, unspecified whether generalized or localized, involving lower leg 10/30/2014  . COPD (chronic obstructive pulmonary disease) ([place]) 10/30/2014  . Chronic diastolic CHF (congestive heart failure) ([place]) 05/15/2014  . Morbid obesity ([place])   . History of colon polyps 03/09/2013  . Renal mass 06/26/2011  . Lytic bone lesion of hip 06/25/2011  . Hypertension 06/24/2011  . Diabetes mellitus ([place]) 06/24/2011    Past Surgical  History:  Procedure Laterality Date  . CATARACT EXTRACTION W/PHACO Left 09/10/2014   Procedure: CATARACT EXTRACTION PHACO AND INTRAOCULAR LENS PLACEMENT (IOC);  Surgeon: Birder Robson, MD;  Location: [facility] ORS;  Service: Ophthalmology;  Laterality: Left;  [place]: 00:44 AP%: 22.8 CDE: 10.24 Fluid lot #[id number]  . CATARACT EXTRACTION W/PHACO Right 10/15/2014   Procedure: CATARACT EXTRACTION PHACO AND INTRAOCULAR LENS PLACEMENT (IOC);   Surgeon: Birder Robson, MD;  Location: [facility] ORS;  Service: Ophthalmology;  Laterality: Right;  [place]: 00:52 AP:40.1 CDE:11.83 LOT PACK #[id number]  . CHOLECYSTECTOMY    . COLONOSCOPY  04/2011   [facility] per patient incomplete  . EYE SURGERY    . JOINT REPLACEMENT     TKR  . KNEE RECONSTRUCTION, MEDIAL PATELLAR FEMORAL LIGAMENT    . RENAL BIOPSY    . TONSILLECTOMY    . TONSILLECTOMY      Allergies Haldol [haloperidol decanoate]; Prednisone; Raspberry; and Metformin  Social History Social History   Tobacco Use  . Smoking status: Former Smoker    Packs/day: 3.00    Years: 40.00    Pack years: 120.00  . Smokeless tobacco: Never Used  . Tobacco comment: quit in 2009  Substance Use Topics  . Alcohol use: No    Comment: occ.  . Drug use: No   Review of Systems Constitutional: Negative for fever. Cardiovascular: Positive for chest pain Respiratory: Negative for shortness of breath. Gastrointestinal: Negative for abdominal pain, vomiting and diarrhea. Musculoskeletal: Negative for back pain. Skin: Negative for rash. Neurological: Negative for headaches, focal weakness or numbness.  All systems negative/normal/unremarkable except as stated in the HPI  ____________________________________________   PHYSICAL EXAM:  VITAL SIGNS: ED Triage Vitals  Enc Vitals Group     BP      Pulse      Resp      Temp      Temp src      SpO2      Weight      Height      Head Circumference      Peak Flow      Pain Score      Pain Loc      Pain Edu?      Excl. in [place]?    Constitutional: Alert and oriented.  Anxious and tearful Eyes: Conjunctivae are normal. Normal extraocular movements. Cardiovascular: Normal rate, regular rhythm. No murmurs, rubs, or gallops. Respiratory: Normal respiratory effort without tachypnea nor retractions. Breath sounds are clear and equal bilaterally. No wheezes/rales/rhonchi. Gastrointestinal: Soft and nontender. Normal bowel sounds Musculoskeletal:  Nontender with normal range of motion in extremities. No lower extremity tenderness nor edema. Neurologic:  Normal speech and language. No gross focal neurologic deficits are appreciated.  Skin:  Skin is warm, dry and intact. No rash noted. Psychiatric: Depressed mood and affect ____________________________________________  EKG: Interpreted by me.  Sinus rhythm with artifact, likely PACs, normal axis, normal QT, diffuse T wave abnormalities  ____________________________________________  ED COURSE:  As part of my medical decision making, I reviewed the following data within the [place] History obtained from family if available, nursing notes, old chart and ekg, as well as notes from prior ED visits. Patient presented for chest pain and is a frequent utilizer of the 911 system, we will assess with labs and imaging as indicated at this time.   Procedures  SHAQUAVIA WHISONANT was evaluated in Emergency Department on 07/24/2018 for the symptoms described in the history of present illness. She was evaluated in  the context of the global COVID-19 pandemic, which necessitated consideration that the patient might be at risk for infection with the SARS-CoV-2 virus that causes COVID-19. Institutional protocols and algorithms that pertain to the evaluation of patients at risk for COVID-19 are in a state of rapid change based on information released by regulatory bodies including the CDC and federal and state organizations. These policies and algorithms were followed during the patient's care in the ED.  ____________________________________________   LABS (pertinent positives/negatives)  Labs Reviewed  CBC WITH DIFFERENTIAL/PLATELET - Abnormal; Notable for the following components:      Result Value   Lymphs Abs 4.2 (*)    Monocytes Absolute 1.2 (*)    All other components within normal limits  COMPREHENSIVE METABOLIC PANEL - Abnormal; Notable for the following components:   Glucose, Bld  142 (*)    Calcium 8.7 (*)    All other components within normal limits  URINALYSIS, COMPLETE (UACMP) WITH MICROSCOPIC - Abnormal; Notable for the following components:   Color, Urine YELLOW (*)    APPearance CLOUDY (*)    Hgb urine dipstick SMALL (*)    Protein, ur 30 (*)    Nitrite POSITIVE (*)    Leukocytes,Ua LARGE (*)    WBC, UA >50 (*)    Bacteria, UA FEW (*)    All other components within normal limits  GLUCOSE, CAPILLARY - Abnormal; Notable for the following components:   Glucose-Capillary 130 (*)    All other components within normal limits  URINE CULTURE  TROPONIN I  CBG MONITORING, ED   ____________________________________________   DIFFERENTIAL DIAGNOSIS   Musculoskeletal pain, anxiety, GERD, schizophrenia, MI  FINAL ASSESSMENT AND PLAN  Chest pain, medical screening exam, urinary tract infection   Plan: The patient had presented for left-sided chest pain. Patient's labs did reveal significant urinary tract infection for which she was given IV Rocephin and we sent a urine culture.  She is cleared for outpatient follow-up.   Laurence Aly, MD    Note: This note was generated in part or whole with voice recognition software. Voice recognition is usually quite accurate but there are transcription errors that can and very often do occur. I apologize for any typographical errors that were not detected and corrected.     Earleen Newport, MD 07/24/18 [id number]

## 2018-07-24 NOTE — ED Notes (Signed)
Pt given phone to use 

## 2018-07-24 NOTE — ED Notes (Signed)

## 2018-07-24 NOTE — BH Assessment (Signed)
Assessment Note  [name] [name] is an 73 y.o. female. [name] [name] arrived to the ED by way of EMS. [name] [name] reports that her chest is hurting her.  She states that she has angina. She reports that the home she is in has them locked in and that they would not give her oxygen and she was told she did not need oxygen, and her son called for them to give her oxygen. She denied symptoms of depression.  She denied symptoms of anxiety.  She denied having auditory or visual hallucinations.  She denied suicidal or homicidal ideation or intent.  She states that she pleaded and begged the staff to help her last night, and they would not.  She reports that she is at [facility] facility. She denied the use of alcohol or drugs.  She is having ongoing problems at her facility and states that she has been at this facility for about 2 months.  "I am scared for my life"   Diagnosis:   Past Medical History:  Past Medical History:  Diagnosis Date  . Anxiety   . Arthritis   . Bronchitis   . Bursitis   . CAD (coronary artery disease)   . CHF (congestive heart failure) ([place])   . Chronic cough   . Chronic kidney disease    shadow on x-ray  . COPD (chronic obstructive pulmonary disease) ([place])   . Depression   . DM2 (diabetes mellitus, type 2) ([place])   . Environmental allergies   . GERD (gastroesophageal reflux disease)   . Hypercholesteremia   . Hypertension   . Left ventricular outflow tract obstruction    a. echo 03/2014: EF 60-65%, hypernamic LV systolic fxn, mod LVH w/ LVOT gradient estimated at 68 mm Hg w/ valsalva, very small LV internal cavity size, mildly increased LV posterior wall thickness, mild Ao valve scl w/o stenosis, diastolic dysfunction, normal RVSP  . Lower extremity edema   . LVH (left ventricular hypertrophy)    a. echo suggests long standing uncontrolled htn. she will not do well when dehydrated, LV cavity obliteration  . Muscle weakness   . Obesity   . On supplemental oxygen  therapy    AS NEEDED  . OSA (obstructive sleep apnea)    does not use machine  . Osteoarthritis   . Schizophrenia ([place])   . Tremors of nervous system   . Wheezing     Past Surgical History:  Procedure Laterality Date  . CATARACT EXTRACTION W/PHACO Left 09/10/2014   Procedure: CATARACT EXTRACTION PHACO AND INTRAOCULAR LENS PLACEMENT (IOC);  Surgeon: Birder Robson, MD;  Location: [facility] ORS;  Service: Ophthalmology;  Laterality: Left;  [place]: 00:44 AP%: 22.8 CDE: 10.24 Fluid lot #[id number]  . CATARACT EXTRACTION W/PHACO Right 10/15/2014   Procedure: CATARACT EXTRACTION PHACO AND INTRAOCULAR LENS PLACEMENT (IOC);  Surgeon: Birder Robson, MD;  Location: [facility] ORS;  Service: Ophthalmology;  Laterality: Right;  [place]: 00:52 AP:40.1 CDE:11.83 LOT PACK #[id number]  . CHOLECYSTECTOMY    . COLONOSCOPY  04/2011   [facility] per patient incomplete  . EYE SURGERY    . JOINT REPLACEMENT     TKR  . KNEE RECONSTRUCTION, MEDIAL PATELLAR FEMORAL LIGAMENT    . RENAL BIOPSY    . TONSILLECTOMY    . TONSILLECTOMY      Family History:  Family History  Problem Relation Age of Onset  . Heart attack Mother   . Colon cancer Neg Hx   . Liver disease Neg Hx  Social History:  reports that she has quit smoking. She has a 120.00 pack-year smoking history. She has never used smokeless tobacco. She reports that she does not drink alcohol or use drugs.  Additional Social History:  Alcohol / Drug Use History of alcohol / drug use?: No history of alcohol / drug abuse  CIWA: CIWA-Ar BP: (!) 161/75 Pulse Rate: 78 COWS:    Allergies:  Allergies  Allergen Reactions  . Haldol [Haloperidol Decanoate] Swelling and Other (See Comments)    Reaction:  Swelling of tongue and blurred vision    . Prednisone Other (See Comments)    Unknown reaction  . Raspberry Swelling and Other (See Comments)    Reaction:  Swelling of lips   . Metformin Diarrhea    Home Medications: (Not in a hospital admission)   OB/GYN  Status:  No LMP recorded. Patient is postmenopausal.  General Assessment Data Location of Assessment: [facility] ED TTS Assessment: In system Is this a Tele or Face-to-Face Assessment?: Face-to-Face Is this an Initial Assessment or a Re-assessment for this encounter?: Initial Assessment Patient Accompanied by:: N/A Language Other than English: No Living Arrangements: In Assisted Living/Nursing Home (Comment: Name of [facility]) What gender do you identify as?: Female Pregnancy Status: No Living Arrangements: Other (Comment)(Retirement facility) Can pt return to current living arrangement?: Yes Admission Status: Voluntary Is patient capable of signing voluntary admission?: Yes Referral Source: Self/Family/Friend Insurance type: Medicare  Medical Screening Exam ([place]) Medical Exam completed: Yes  Crisis Care Plan Living Arrangements: Other (Comment)(Retirement facility) Legal Guardian: Other:(Son - [name]) Name of Psychiatrist: Dr Weber Cooks Name of Therapist: None  Education Status Is patient currently in school?: No Is the patient employed, unemployed or receiving disability?: Receiving disability income  Risk to self with the past 6 months Suicidal Ideation: No Has patient been a risk to self within the past 6 months prior to admission? : No Suicidal Intent: No Has patient had any suicidal intent within the past 6 months prior to admission? : No Is patient at risk for suicide?: No Suicidal Plan?: No Has patient had any suicidal plan within the past 6 months prior to admission? : No Access to Means: No What has been your use of drugs/alcohol within the last 12 months?: Denied use Previous Attempts/Gestures: No How many times?: 0 Other Self Harm Risks: denied Intentional Self Injurious Behavior: None Family Suicide History: No Recent stressful life event(s): Other (Comment)(problems in her facility) Persecutory voices/beliefs?: No Depression:  No Substance abuse history and/or treatment for substance abuse?: No Suicide prevention information given to non-admitted patients: Not applicable  Risk to Others within the past 6 months Homicidal Ideation: No Does patient have any lifetime risk of violence toward others beyond the six months prior to admission? : No Thoughts of Harm to Others: No Current Homicidal Intent: No Current Homicidal Plan: No Access to Homicidal Means: No Identified Victim: None identified History of harm to others?: No Assessment of Violence: None Noted Does patient have access to weapons?: No Criminal Charges Pending?: No Does patient have a court date: No Is patient on probation?: No  Psychosis Hallucinations: None noted Delusions: None noted  Mental Status Report Appearance/Hygiene: Unremarkable Eye Contact: Fair Motor Activity: Unremarkable Speech: Aggressive, Tangential Level of Consciousness: Alert Mood: Angry Affect: Irritable Anxiety Level: Minimal Thought Processes: Tangential, Flight of Ideas Judgement: Partial Orientation: Appropriate for developmental age Obsessive Compulsive Thoughts/Behaviors: None  Cognitive Functioning Concentration: Fair Memory: Recent Intact Is patient IDD: No Insight: Poor  Impulse Control: Poor Appetite: Fair Have you had any weight changes? : No Change Sleep: No Change Vegetative Symptoms: None  ADLScreening [facility]) Patient's cognitive ability adequate to safely complete daily activities?: Yes Patient able to express need for assistance with ADLs?: Yes Independently performs ADLs?: No  Prior Inpatient Therapy Prior Inpatient Therapy: Yes Prior Therapy Dates: 2019 and prior Prior Therapy Facilty/Provider(s): [facility], and many others(She would not identify) Reason for Treatment: Schizophrenia  Prior Outpatient Therapy Prior Outpatient Therapy: Yes Prior Therapy Dates: Current Reason for Treatment: Schizophrenia Does patient  have an ACCT team?: No Does patient have Intensive In-House Services?  : No Does patient have [facility] services? : No Does patient have P4CC services?: No  ADL Screening (condition at time of admission) Patient's cognitive ability adequate to safely complete daily activities?: Yes Is the patient deaf or have difficulty hearing?: No Does the patient have difficulty seeing, even when wearing glasses/contacts?: No Does the patient have difficulty concentrating, remembering, or making decisions?: No Patient able to express need for assistance with ADLs?: Yes Does the patient have difficulty dressing or bathing?: Yes Independently performs ADLs?: No Communication: Independent Dressing (OT): Needs assistance Is this a change from baseline?: Pre-admission baseline Walks in Home: Needs assistance Is this a change from baseline?: Pre-admission baseline Does the patient have difficulty walking or climbing stairs?: Yes Weakness of Legs: Both Weakness of Arms/Hands: None  Home Assistive Devices/Equipment Home Assistive Devices/Equipment: None(She denied use of cane or walker)                     Disposition:     On Site Evaluation by:   Reviewed with Physician:    Elmer Bales 07/24/2018 8:21 PM

## 2018-07-24 NOTE — ED Notes (Signed)
ED Is the patient under IVC or is there intent for IVC:  no Is the patient medically cleared: Yes.   Is there vacancy in the ED BHU: Yes.   Is the population mix appropriate for patient: Yes.   Is the patient awaiting placement in inpatient or outpatient setting:  Has the patient had a psychiatric consult:  Consult pending  Survey of unit performed for contraband, proper placement and condition of furniture, tampering with fixtures in bathroom, shower, and each patient room: Yes.  ; Findings:  APPEARANCE/BEHAVIOR Calm and cooperative NEURO ASSESSMENT Orientation: oriented x3  Denies pain Hallucinations: No.None noted (Hallucinations)  denies Speech: Normal Gait: normal RESPIRATORY ASSESSMENT Even  Unlabored respirations  CARDIOVASCULAR ASSESSMENT Pulses equal   regular rate  Skin warm and dry   GASTROINTESTINAL ASSESSMENT no GI complaint EXTREMITIES Full ROM  PLAN OF CARE Provide calm/safe environment. Vital signs assessed twice daily. ED BHU Assessment once each 12-hour shift. Collaborate with TTS when available or as condition indicates. Assure the ED provider has rounded once each shift. Provide and encourage hygiene. Provide redirection as needed. Assess for escalating behavior; address immediately and inform ED provider.  Assess family dynamic and appropriateness for visitation as needed: Yes.  ; If necessary, describe findings:  Educate the patient/family about BHU procedures/visitation: Yes.  ; If necessary, describe findings:

## 2018-07-24 NOTE — Progress Notes (Signed)
   07/24/18 2100  Clinical Encounter Type  Visited With Patient  Visit Type Initial;Spiritual support  Referral From Nurse  Consult/Referral To Chaplain  Spiritual Encounters  Spiritual Needs Prayer;Emotional  Chaplain received page to visit patient. Chaplain entered room and patient was lying in bed taking meds. Patient was glad to see Chaplain. Patient discuss her living at rest home and how the people were trying to get her. Patient talked about her son and grandchildren. Patient asked Chaplain to pray before she left and they prayed and Chaplain left a bible with patient. Patient ask for one.

## 2018-07-24 NOTE — ED Notes (Signed)
Pt assisted to bedside commode by this tech and Thedore Mins, EDT. Pt cleaned and back in bed resting. No other needs voiced at this time. Will continue to monitor Q15 minute rounds.

## 2018-07-24 NOTE — ED Triage Notes (Signed)
Pt from [facility] with reports that pt got hold of her cell phone and called 911 multiple times to reports left chest pain/shoulder pain. Pt crying stating that she hates where she is living and refuses to go back there.

## 2018-07-24 NOTE — ED Notes (Signed)
Pt reports does not want to go back to her nursing facility, "I think they are going to poison me".  Dr Jimmye Norman notified.  Pt kept repeatedly calling 911 from her cell phone per report.

## 2018-07-24 NOTE — ED Notes (Signed)
BEHAVIORAL HEALTH ROUNDING Patient sleeping: No. Patient alert and oriented: yes Behavior appropriate: Yes.  ; If no, describe:  Nutrition and fluids offered: yes Toileting and hygiene offered: Yes  Sitter present: q15 minute observations and security  monitoring Law enforcement present: Yes  ODS  

## 2018-07-24 NOTE — ED Notes (Signed)
Patient was dried at this time. 

## 2018-07-24 NOTE — ED Notes (Signed)
The chaplain is currently visiting with her

## 2018-07-24 NOTE — ED Notes (Signed)
Patient in room still eating at this time.

## 2018-07-25 DIAGNOSIS — N3000 Acute cystitis without hematuria: Secondary | ICD-10-CM

## 2018-07-25 DIAGNOSIS — R0789 Other chest pain: Secondary | ICD-10-CM | POA: Diagnosis not present

## 2018-07-25 DIAGNOSIS — R4182 Altered mental status, unspecified: Secondary | ICD-10-CM | POA: Diagnosis not present

## 2018-07-25 DIAGNOSIS — F05 Delirium due to known physiological condition: Secondary | ICD-10-CM | POA: Diagnosis not present

## 2018-07-25 LAB — GLUCOSE, CAPILLARY: Glucose-Capillary: 188 mg/dL — ABNORMAL HIGH (ref 70–99)

## 2018-07-25 MED ORDER — CEPHALEXIN 500 MG PO CAPS
500.0000 mg | ORAL_CAPSULE | Freq: Once | ORAL | Status: AC
  Administered 2018-07-25: 500 mg via ORAL
  Filled 2018-07-25: qty 1

## 2018-07-25 NOTE — ED Notes (Signed)
Nurse and tech. Gave patient sponge bath, changed brief, Patient tolerated without difficulty, will continue to monitor.

## 2018-07-25 NOTE — ED Notes (Signed)
Patient crying stating, they make fun of me at [facility], nurse listened and talked to her about coping skills, she states that she wishes to die at times, but has no plan, no Hi or avh, will continue to monitor.

## 2018-07-25 NOTE — Consult Note (Signed)
[facility] Face-to-Face Psychiatry Consult   Reason for Consult: Behavior problems Referring Physician: Dr. Jimmye Norman Patient Identification: [name] [name] MRN:  [id number] Principal Diagnosis: Altered mental status Diagnosis:  Principal Problem:   Altered mental status   Total Time spent with patient: 1 hour  Subjective: "Honey, aint nothing wrong with me.  Is dosed people at the place and leave on me to me." [name] [name] is a 73 y.o. female patient presented to [facility] ED via EMS voluntarily.  This is a patient who is well known to [facility] and the psychiatric services.  Along with her having chronic mental health problems she is also battling multiple severe medical issues.  This patient has had many prior encounters both psychiatrically and medically.  She was brought in to the hospital due to her behavior today, her screaming out and being very difficult to care for at her retirement facility. Per the patient's son she was diagnosed with UTI. Her son wanted her to be assessed by psychiatry so "they can readjust her medication." On assessment the patient seems to actually be thinking pretty clearly and to be in a good state of mind.  She describes events leading to the hospitalization without invoking any kind of paranoia or bizarre scenario.  The patient admits to presently dealing with angry and hostile staff at her retirement home. She states "because they are ugly to me." She discussed that they tell her she is demanding, needy and want something all the time." The patient is not presenting to being paranoid.  She is presenting with some anxiety which seemed to be normal for her.  A lot of her anxiety is due to the patient being alone and wanting someone, to spend a little one-on- one time with her.  She is complaining of chest pain.  The patient was able to articulate her situation living in her retirement home and the treatment she is receiving.  The patient was seen face-to-face by this provider;  chart reviewed and consulted with  Dr. Charlynn Court on 07/24/2018 due to the care of the patient. It was discussed with the provider that the patient does not meet criteria to be admitted to the inpatient unit.  On evaluation the patient is alert and oriented x3, calm and cooperative, and mood-congruent with affect. The patient does not appear to be responding to internal or external stimuli. Neither is the patient presenting with any delusional thinking. The patient denies auditory or visual hallucinations. The patient denies any suicidal, homicidal, or self-harm ideations. The patient is not presenting with any psychotic or paranoid behaviors.  The patient states "I am not crazy."  During an encounter with the patient, she was able to answer questions appropriately. Collateral was obtained by the son of [name].  Mr. [name] [phone]) who expresses concerns for his mom behaviors.  He stated "that my mom has been yelling out, calling the police on the staff in the facility, refusing to take her medications and calling everyone ugly names."  He does admit to the patient having a UTI.  He wants his mom medications to be reevaluated and to make any changes that can be made.  Plan: The patient is not a safety risk to self or others and does not require psychiatric inpatient admission for stabilization and treatment.    HPI: Per Dr. Jimmye Norman; [name] [name] is a 73 y.o. female with a history of anxiety, bronchitis, coronary artery disease, CHF, chronic kidney disease, depression, diabetes, hyperlipidemia, hypertension, schizophrenia  who presents to the ED for chest pain and concerns about her nursing home.  Patient typically calls EMS multiple times a day but this is usually managed by her nursing home.  She was able to call directly which is the reason she was brought in today.  She complains of left chest pain and has had a recent fall.   Past Psychiatric History:   Anxiety Depression Schizophrenia Risk to Self: Suicidal Ideation: No Suicidal Intent: No Is patient at risk for suicide?: No Suicidal Plan?: No Access to Means: No What has been your use of drugs/alcohol within the last 12 months?: Denied use How many times?: 0 Other Self Harm Risks: denied Intentional Self Injurious Behavior: None Risk to Others: Homicidal Ideation: No Thoughts of Harm to Others: No Current Homicidal Intent: No Current Homicidal Plan: No Access to Homicidal Means: No Identified Victim: None identified History of harm to others?: No Assessment of Violence: None Noted Does patient have access to weapons?: No Criminal Charges Pending?: No Does patient have a court date: No Prior Inpatient Therapy: Prior Inpatient Therapy: Yes Prior Therapy Dates: 2019 and prior Prior Therapy Facilty/Provider(s): [facility], and many others(She would not identify) Reason for Treatment: Schizophrenia Prior Outpatient Therapy: Prior Outpatient Therapy: Yes Prior Therapy Dates: Current Reason for Treatment: Schizophrenia Does patient have an ACCT team?: No Does patient have Intensive In-House Services?  : No Does patient have [facility] services? : No Does patient have P4CC services?: No  Past Medical History:  Past Medical History:  Diagnosis Date  . Anxiety   . Arthritis   . Bronchitis   . Bursitis   . CAD (coronary artery disease)   . CHF (congestive heart failure) ([place])   . Chronic cough   . Chronic kidney disease    shadow on x-ray  . COPD (chronic obstructive pulmonary disease) ([place])   . Depression   . DM2 (diabetes mellitus, type 2) ([place])   . Environmental allergies   . GERD (gastroesophageal reflux disease)   . Hypercholesteremia   . Hypertension   . Left ventricular outflow tract obstruction    a. echo 03/2014: EF 60-65%, hypernamic LV systolic fxn, mod LVH w/ LVOT gradient estimated at 68 mm Hg w/ valsalva, very small LV internal cavity size, mildly increased LV  posterior wall thickness, mild Ao valve scl w/o stenosis, diastolic dysfunction, normal RVSP  . Lower extremity edema   . LVH (left ventricular hypertrophy)    a. echo suggests long standing uncontrolled htn. she will not do well when dehydrated, LV cavity obliteration  . Muscle weakness   . Obesity   . On supplemental oxygen therapy    AS NEEDED  . OSA (obstructive sleep apnea)    does not use machine  . Osteoarthritis   . Schizophrenia ([place])   . Tremors of nervous system   . Wheezing     Past Surgical History:  Procedure Laterality Date  . CATARACT EXTRACTION W/PHACO Left 09/10/2014   Procedure: CATARACT EXTRACTION PHACO AND INTRAOCULAR LENS PLACEMENT (IOC);  Surgeon: Birder Robson, MD;  Location: [facility] ORS;  Service: Ophthalmology;  Laterality: Left;  [place]: 00:44 AP%: 22.8 CDE: 10.24 Fluid lot #[id number]  . CATARACT EXTRACTION W/PHACO Right 10/15/2014   Procedure: CATARACT EXTRACTION PHACO AND INTRAOCULAR LENS PLACEMENT (IOC);  Surgeon: Birder Robson, MD;  Location: [facility] ORS;  Service: Ophthalmology;  Laterality: Right;  [place]: 00:52 AP:40.1 CDE:11.83 LOT PACK #[id number]  . CHOLECYSTECTOMY    . COLONOSCOPY  04/2011   [facility] per patient incomplete  .  EYE SURGERY    . JOINT REPLACEMENT     TKR  . KNEE RECONSTRUCTION, MEDIAL PATELLAR FEMORAL LIGAMENT    . RENAL BIOPSY    . TONSILLECTOMY    . TONSILLECTOMY     Family History:  Family History  Problem Relation Age of Onset  . Heart attack Mother   . Colon cancer Neg Hx   . Liver disease Neg Hx    Family Psychiatric  History:  None provided Social History:  Social History   Substance and Sexual Activity  Alcohol Use No   Comment: occ.     Social History   Substance and Sexual Activity  Drug Use No    Social History   Socioeconomic History  . Marital status: Widowed    Spouse name: Not on file  . Number of children: 2  . Years of education: Not on file  . Highest education level: Not on file  Occupational  History  . Not on file  Social Needs  . Financial resource strain: Patient refused  . Food insecurity:    Worry: Patient refused    Inability: Patient refused  . Transportation needs:    Medical: Patient refused    Non-medical: Patient refused  Tobacco Use  . Smoking status: Former Smoker    Packs/day: 3.00    Years: 40.00    Pack years: 120.00  . Smokeless tobacco: Never Used  . Tobacco comment: quit in 2009  Substance and Sexual Activity  . Alcohol use: No    Comment: occ.  . Drug use: No  . Sexual activity: Not Currently  Lifestyle  . Physical activity:    Days per week: Patient refused    Minutes per session: Patient refused  . Stress: Patient refused  Relationships  . Social connections:    Talks on phone: Patient refused    Gets together: Patient refused    Attends religious service: Patient refused    Active member of club or organization: Patient refused    Attends meetings of clubs or organizations: Patient refused    Relationship status: Patient refused  Other Topics Concern  . Not on file  Social History Narrative  . Not on file   Additional Social History:    Allergies:   Allergies  Allergen Reactions  . Haldol [Haloperidol Decanoate] Swelling and Other (See Comments)    Reaction:  Swelling of tongue and blurred vision    . Prednisone Other (See Comments)    Unknown reaction  . Raspberry Swelling and Other (See Comments)    Reaction:  Swelling of lips   . Metformin Diarrhea    Labs:  Results for orders placed or performed during the hospital encounter of 07/24/18 (from the past 48 hour(s))  CBC with Differential     Status: Abnormal   Collection Time: 07/24/18  1:19 PM  Result Value Ref Range   WBC 9.5 4.0 - 10.5 K/uL   RBC 4.75 3.87 - 5.11 MIL/uL   Hemoglobin 14.1 12.0 - 15.0 g/dL   HCT 44.8 36.0 - 46.0 %   MCV 94.3 80.0 - 100.0 fL   MCH 29.7 26.0 - 34.0 pg   MCHC 31.5 30.0 - 36.0 g/dL   RDW 14.2 11.5 - 15.5 %   Platelets 233 150 - 400  K/uL   nRBC 0.0 0.0 - 0.2 %   Neutrophils Relative % 41 %   Neutro Abs 3.9 1.7 - 7.7 K/uL   Lymphocytes Relative 45 %   Lymphs Abs  4.2 (H) 0.7 - 4.0 K/uL   Monocytes Relative 13 %   Monocytes Absolute 1.2 (H) 0.1 - 1.0 K/uL   Eosinophils Relative 1 %   Eosinophils Absolute 0.1 0.0 - 0.5 K/uL   Basophils Relative 0 %   Basophils Absolute 0.0 0.0 - 0.1 K/uL   Immature Granulocytes 0 %   Abs Immature Granulocytes 0.04 0.00 - 0.07 K/uL    Comment: Performed at [facility], [address] [place], [place] [address]  Comprehensive metabolic panel     Status: Abnormal   Collection Time: 07/24/18  1:19 PM  Result Value Ref Range   Sodium 140 135 - 145 mmol/L   Potassium 4.2 3.5 - 5.1 mmol/L    Comment: HEMOLYSIS AT THIS LEVEL MAY AFFECT RESULT   Chloride 105 98 - 111 mmol/L   CO2 27 22 - 32 mmol/L   Glucose, Bld 142 (H) 70 - 99 mg/dL   BUN 19 8 - 23 mg/dL   Creatinine, Ser 0.94 0.44 - 1.00 mg/dL   Calcium 8.7 (L) 8.9 - 10.3 mg/dL   Total Protein 7.5 6.5 - 8.1 g/dL   Albumin 3.5 3.5 - 5.0 g/dL   AST 20 15 - 41 U/L   ALT 11 0 - 44 U/L   Alkaline Phosphatase 59 38 - 126 U/L   Total Bilirubin 0.5 0.3 - 1.2 mg/dL   GFR calc non Af Amer >60 >60 mL/min   GFR calc Af Amer >60 >60 mL/min   Anion gap 8 5 - 15    Comment: Performed at [facility], [address] [place], [place] [address]  Troponin I - ONCE - STAT     Status: None   Collection Time: 07/24/18  1:19 PM  Result Value Ref Range   Troponin I <0.03 <0.03 ng/mL    Comment: Performed at [facility] [address] [place], [place] [address]  Glucose, capillary     Status: Abnormal   Collection Time: 07/24/18  1:48 PM  Result Value Ref Range   Glucose-Capillary 130 (H) 70 - 99 mg/dL  Urinalysis, Complete w Microscopic     Status: Abnormal   Collection Time: 07/24/18  2:00 PM  Result Value Ref Range   Color, Urine YELLOW (A) YELLOW   APPearance CLOUDY (A) CLEAR   Specific Gravity, Urine 1.017  1.005 - 1.030   pH 5.0 5.0 - 8.0   Glucose, UA NEGATIVE NEGATIVE mg/dL   Hgb urine dipstick SMALL (A) NEGATIVE   Bilirubin Urine NEGATIVE NEGATIVE   Ketones, ur NEGATIVE NEGATIVE mg/dL   Protein, ur 30 (A) NEGATIVE mg/dL   Nitrite POSITIVE (A) NEGATIVE   Leukocytes,Ua LARGE (A) NEGATIVE   RBC / HPF 11-20 0 - 5 RBC/hpf   WBC, UA >50 (H) 0 - 5 WBC/hpf   Bacteria, UA FEW (A) NONE SEEN   Squamous Epithelial / LPF 0-5 0 - 5   WBC Clumps PRESENT    Mucus PRESENT     Comment: Performed at [facility], [address] [place], [place] [address]  Glucose, capillary     Status: Abnormal   Collection Time: 07/24/18  8:52 PM  Result Value Ref Range   Glucose-Capillary 226 (H) 70 - 99 mg/dL    Current Facility-Administered Medications  Medication Dose Route Frequency Provider Last Rate Last Dose  . acetaminophen (TYLENOL) tablet 650 mg  650 mg Oral Q4H PRN Earleen Newport, MD      . albuterol (PROVENTIL) (2.5  MG/3ML) 0.083% nebulizer solution 5 mg  5 mg Inhalation Q6H PRN Earleen Newport, MD      . aspirin EC tablet 81 mg  81 mg Oral Daily Earleen Newport, MD      . atorvastatin (LIPITOR) tablet 20 mg  20 mg Oral QHS Earleen Newport, MD   20 mg at 07/24/18 2049  . benztropine (COGENTIN) tablet 0.5 mg  0.5 mg Oral BID Earleen Newport, MD   0.5 mg at 07/24/18 2050  . carvedilol (COREG) tablet 12.5 mg  12.5 mg Oral BID Earleen Newport, MD   12.5 mg at 07/24/18 2049  . cephALEXin (KEFLEX) capsule 500 mg  500 mg Oral Q6H Earleen Newport, MD   500 mg at 07/25/18 0003  . cholecalciferol (VITAMIN D3) tablet 1,000 Units  1,000 Units Oral Daily Earleen Newport, MD      . insulin aspart (novoLOG) injection 4 Units  4 Units Subcutaneous QID Earleen Newport, MD   4 Units at 07/24/18 2050  . insulin glargine (LANTUS) injection 11 Units  11 Units Subcutaneous QHS Earleen Newport, MD   11 Units at 07/25/18 0003  . levothyroxine (SYNTHROID) tablet 75  mcg  75 mcg Oral Daily Earleen Newport, MD      . linagliptin (TRADJENTA) tablet 5 mg  5 mg Oral Daily Earleen Newport, MD      . loratadine (CLARITIN) tablet 10 mg  10 mg Oral Daily Earleen Newport, MD      . losartan (COZAAR) tablet 25 mg  25 mg Oral Daily Earleen Newport, MD      . Melatonin TABS 3 mg  3 mg Oral QHS PRN Earleen Newport, MD      . umeclidinium bromide (INCRUSE ELLIPTA) 62.5 MCG/INH 1 puff  1 puff Inhalation Daily Earleen Newport, MD       Current Outpatient Medications  Medication Sig Dispense Refill  . acetaminophen (TYLENOL) 325 MG tablet Take 2 tablets (650 mg total) by mouth every 4 (four) hours as needed for mild pain (temp > 101.5). 30 tablet 2  . albuterol (PROVENTIL HFA;VENTOLIN HFA) 108 (90 Base) MCG/ACT inhaler Inhale 2 puffs into the lungs every 6 (six) hours as needed for wheezing or shortness of breath.    Marland Kitchen aspirin EC 81 MG tablet Take 81 mg by mouth daily.    Marland Kitchen atorvastatin (LIPITOR) 20 MG tablet Take 20 mg by mouth at bedtime.    . benztropine (COGENTIN) 0.5 MG tablet Take 0.5 mg by mouth 2 (two) times daily.    . carvedilol (COREG) 12.5 MG tablet Take 1 tablet (12.5 mg total) by mouth 2 (two) times daily. 60 tablet 0  . cephALEXin (KEFLEX) 500 MG capsule Take 1 capsule (500 mg total) by mouth 4 (four) times daily for 10 days. 40 capsule 0  . cetirizine (ZYRTEC) 10 MG tablet Take 10 mg by mouth daily.    . cholecalciferol (VITAMIN D) 1000 units tablet Take 1,000 Units by mouth daily.    . divalproex (DEPAKOTE) 500 MG DR tablet Take 1 tablet (500 mg total) by mouth 3 (three) times daily. 90 tablet 0  . docusate sodium (COLACE) 100 MG capsule Take 100 mg by mouth 2 (two) times daily.     Marland Kitchen ENSURE MAX PROTEIN (ENSURE MAX PROTEIN) LIQD Take 330 mLs (11 oz total) by mouth 2 (two) times daily. 60 mL 0  . famotidine (PEPCID) 20 MG tablet Take 20 mg  by mouth 2 (two) times daily.    . fluPHENAZine (PROLIXIN) 5 MG/ML solution Take 5 mg by  mouth 2 (two) times daily.     . Fluticasone-Salmeterol (ADVAIR) 250-50 MCG/DOSE AEPB Inhale 1 puff into the lungs 2 (two) times daily.    . insulin aspart (NOVOLOG) 100 UNIT/ML injection Inject 4 Units into the skin 4 (four) times daily. 10 mL 0  . insulin glargine (LANTUS) 100 UNIT/ML injection Inject 11 Units into the skin at bedtime.     Marland Kitchen levothyroxine (SYNTHROID, LEVOTHROID) 75 MCG tablet Take 1 tablet by mouth daily.    Marland Kitchen losartan (COZAAR) 25 MG tablet Take 25 mg by mouth daily.    . Melatonin (SM MELATONIN) 3 MG TABS Take 3 mg by mouth at bedtime as needed (sleep).    . nitroGLYCERIN (NITROSTAT) 0.4 MG SL tablet Place 0.4 mg under the tongue every 5 (five) minutes as needed for chest pain.    Marland Kitchen oxyCODONE (OXY IR/ROXICODONE) 5 MG immediate release tablet Take 1 tablet (5 mg total) by mouth every 6 (six) hours as needed for moderate pain or severe pain. 20 tablet 0  . sitaGLIPtin (JANUVIA) 50 MG tablet Take 50 mg by mouth daily.    Marland Kitchen umeclidinium bromide (INCRUSE ELLIPTA) 62.5 MCG/INH AEPB Inhale 1 puff into the lungs daily.      Musculoskeletal: Strength & Muscle Tone: decreased Gait & Station: unable to stand Patient leans: Backward  Psychiatric Specialty Exam: Physical Exam  Nursing note and vitals reviewed. Constitutional: She is oriented to person, place, and time. She appears well-developed and well-nourished.  HENT:  Head: Normocephalic and atraumatic.  Eyes: Pupils are equal, round, and reactive to light. Conjunctivae and EOM are normal.  Neck: Normal range of motion. Neck supple.  Cardiovascular: Normal rate and regular rhythm.  Respiratory: Effort normal and breath sounds normal.  Musculoskeletal:        General: Tenderness present.  Neurological: She is alert and oriented to person, place, and time.  Skin: Skin is warm and dry.  Psychiatric: Her behavior is normal. Judgment normal.    Review of Systems  Cardiovascular: Positive for leg swelling.  Musculoskeletal:  Positive for falls and joint pain.  Psychiatric/Behavioral: Positive for depression. The patient is nervous/anxious.   All other systems reviewed and are negative.   Blood pressure 130/81, pulse 82, temperature 98 F (36.7 C), temperature source Oral, resp. rate 18, weight 120 kg, SpO2 98 %.Body mass index is 42.7 kg/m.  General Appearance: Fairly Groomed  Eye Contact:  Good  Speech:  Clear and Coherent  Volume:  Increased  Mood:  NA  Affect:  Appropriate  Thought Process:  Coherent  Orientation:  Full (Time, Place, and Person)  Thought Content:  Logical  Suicidal Thoughts:  No  Homicidal Thoughts:  No  Memory:  Immediate;   Good Recent;   Good  Judgement:  Fair  Insight:  Fair  Psychomotor Activity:  Decreased  Concentration:  Concentration: Good and Attention Span: Good  Recall:  Good  Fund of Knowledge:  Good  Language:  Good  Akathisia:  NA  Handed:  Right  AIMS (if indicated):     Assets:  Desire for Improvement Housing Physical Health Social Support  ADL's:  Impaired  Cognition:  WNL  Sleep:   Good     Treatment Plan Summary: Daily contact with patient to assess and evaluate symptoms and progress in treatment, Medication management and Plan Patient does not meet criteria for inpatient psychiatric admission  Disposition: No evidence of imminent risk to self or others at present.   Patient does not meet criteria for psychiatric inpatient admission. Supportive therapy provided about ongoing stressors.  Lamont Dowdy, NP 07/25/2018 1:50 AM

## 2018-07-25 NOTE — ED Notes (Signed)
Hourly rounding reveals patient in room. No complaints, stable, in no acute distress. Q15 minute rounds and monitoring via Rover and Officer to continue.   

## 2018-07-25 NOTE — ED Provider Notes (Signed)
Patient cleared by psychiatry for discharge, prescription for urinary tract infection provided   Lavonia Drafts, MD 07/25/18 1213

## 2018-07-25 NOTE — ED Notes (Signed)
Patient transported back to [facility] via EMS, belongings sent with her, Patient transferred from bed to stretcher without any complications or difficulty,.

## 2018-07-25 NOTE — ED Notes (Signed)
Nurse called legal Guardian [name]) and let him know that Patient was being discharged back to [facility].

## 2018-07-25 NOTE — ED Notes (Signed)
Hourly rounding reveals patient sleeping in room. No complaints, stable, in no acute distress. Q15 minute rounds and monitoring via Rover and Officer to continue.  

## 2018-07-25 NOTE — Consult Note (Signed)
[facility] Face-to-Face Psychiatry Consult Follow-up  Reason for Consult: Behavior problems Referring Physician: Dr. Jimmye Norman Patient Identification: [name] [name] MRN:  [id number] Principal Diagnosis: Altered mental status Diagnosis:  Principal Problem:   Altered mental status  Patient is seen, chart is reviewed. Total Time spent with patient: 15 minutes  Subjective: "I'm ok.  I feel safe"   HPI: Per Dr. Jimmye Norman; [name] [name] is a 73 y.o. female with a history of anxiety, bronchitis, coronary artery disease, CHF, chronic kidney disease, depression, diabetes, hyperlipidemia, hypertension, schizophrenia who presents to the ED for chest pain and concerns about her nursing home.  Patient typically calls EMS multiple times a day but this is usually managed by her nursing home.  She was able to call directly which is the reason she was brought in today.  She complains of left chest pain and has had a recent fall.  [name] [name] is a 73 y.o. female patient presented to [facility] ED via EMS voluntarily.  This is a patient who is well known to [facility] and the psychiatric services.  Along with her having chronic mental health problems she is also battling multiple severe medical issues.  This patient has had many prior encounters both psychiatrically and medically.  She was brought in to the hospital due to her behavior today, her screaming out and being very difficult to care for at her retirement facility. Per the patient's son she was diagnosed with UTI. Her son wanted her to be assessed by psychiatry so "they can readjust her medication." On assessment the patient seems to actually be thinking pretty clearly and to be in a good state of mind.  She describes events leading to the hospitalization without invoking any kind of paranoia or bizarre scenario.  The patient admits to presently dealing with angry and hostile staff at her retirement home. She states "because they are ugly to me." She discussed that they tell  her she is demanding, needy and want something all the time." The patient is not presenting to being paranoid.  She is presenting with some anxiety which seemed to be normal for her.  A lot of her anxiety is due to the patient being alone and wanting someone, to spend a little one-on- one time with her.  She is complaining of chest pain.  The patient was able to articulate her situation living in her retirement home and the treatment she is receiving. On initial psychiatric evaluation 07/24/2018, the patient is alert and oriented x3, calm and cooperative, and mood-congruent with affect. The patient does not appear to be responding to internal or external stimuli. Neither is the patient presenting with any delusional thinking. The patient denies auditory or visual hallucinations. The patient denies any suicidal, homicidal, or self-harm ideations. The patient is not presenting with any psychotic or paranoid behaviors.  The patient states "I am not crazy."  During an encounter with the patient, she was able to answer questions appropriately. Collateral was obtained by the son of [name] [name].  Mr. [name] [phone]) who expresses concerns for his mom behaviors.  He stated "that my mom has been yelling out, calling the police on the staff in the facility, refusing to take her medications and calling everyone ugly names."  He does admit to the patient having a UTI.  He wants his mom medications to be reevaluated and to make any changes that can be made.  On reevaluation today, 07/25/2018, patient is resting in bed.  She arouses easily and  is calm and cooperative.  She reports that she feels safe.  She denies safety concerns at home.  Patient denies any suicidal or homicidal ideation.  She does not endorse AVH, nor does she appear to be responding to internal stimuli.  Of note, patient was found to have UTI and has received antibiotics.  Patient is able to contract for safety today return back to her living  facility.   Past Psychiatric History:  Anxiety Depression Schizophrenia  Risk to Self: Suicidal Ideation: No Suicidal Intent: No Is patient at risk for suicide?: No Suicidal Plan?: No Access to Means: No What has been your use of drugs/alcohol within the last 12 months?: Denied use How many times?: 0 Other Self Harm Risks: denied Intentional Self Injurious Behavior: None Risk to Others: Homicidal Ideation: No Thoughts of Harm to Others: No Current Homicidal Intent: No Current Homicidal Plan: No Access to Homicidal Means: No Identified Victim: None identified History of harm to others?: No Assessment of Violence: None Noted Does patient have access to weapons?: No Criminal Charges Pending?: No Does patient have a court date: No Prior Inpatient Therapy: Prior Inpatient Therapy: Yes Prior Therapy Dates: 2019 and prior Prior Therapy Facilty/Provider(s): [facility], and many others(She would not identify) Reason for Treatment: Schizophrenia Prior Outpatient Therapy: Prior Outpatient Therapy: Yes Prior Therapy Dates: Current Reason for Treatment: Schizophrenia Does patient have an ACCT team?: No Does patient have Intensive In-House Services?  : No Does patient have [facility] services? : No Does patient have P4CC services?: No  Past Medical History:  Past Medical History:  Diagnosis Date  . Anxiety   . Arthritis   . Bronchitis   . Bursitis   . CAD (coronary artery disease)   . CHF (congestive heart failure) ([place])   . Chronic cough   . Chronic kidney disease    shadow on x-ray  . COPD (chronic obstructive pulmonary disease) ([place])   . Depression   . DM2 (diabetes mellitus, type 2) ([place])   . Environmental allergies   . GERD (gastroesophageal reflux disease)   . Hypercholesteremia   . Hypertension   . Left ventricular outflow tract obstruction    a. echo 03/2014: EF 60-65%, hypernamic LV systolic fxn, mod LVH w/ LVOT gradient estimated at 68 mm Hg w/ valsalva, very small LV  internal cavity size, mildly increased LV posterior wall thickness, mild Ao valve scl w/o stenosis, diastolic dysfunction, normal RVSP  . Lower extremity edema   . LVH (left ventricular hypertrophy)    a. echo suggests long standing uncontrolled htn. she will not do well when dehydrated, LV cavity obliteration  . Muscle weakness   . Obesity   . On supplemental oxygen therapy    AS NEEDED  . OSA (obstructive sleep apnea)    does not use machine  . Osteoarthritis   . Schizophrenia ([place])   . Tremors of nervous system   . Wheezing     Past Surgical History:  Procedure Laterality Date  . CATARACT EXTRACTION W/PHACO Left 09/10/2014   Procedure: CATARACT EXTRACTION PHACO AND INTRAOCULAR LENS PLACEMENT (IOC);  Surgeon: Birder Robson, MD;  Location: [facility] ORS;  Service: Ophthalmology;  Laterality: Left;  [place]: 00:44 AP%: 22.8 CDE: 10.24 Fluid lot #[id number]  . CATARACT EXTRACTION W/PHACO Right 10/15/2014   Procedure: CATARACT EXTRACTION PHACO AND INTRAOCULAR LENS PLACEMENT (IOC);  Surgeon: Birder Robson, MD;  Location: [facility] ORS;  Service: Ophthalmology;  Laterality: Right;  [place]: 00:52 AP:40.1 CDE:11.83 LOT PACK #[id number]  . CHOLECYSTECTOMY    .  COLONOSCOPY  04/2011   [facility] per patient incomplete  . EYE SURGERY    . JOINT REPLACEMENT     TKR  . KNEE RECONSTRUCTION, MEDIAL PATELLAR FEMORAL LIGAMENT    . RENAL BIOPSY    . TONSILLECTOMY    . TONSILLECTOMY     Family History:  Family History  Problem Relation Age of Onset  . Heart attack Mother   . Colon cancer Neg Hx   . Liver disease Neg Hx    Family Psychiatric  History:   None provided   Social History:  Social History   Substance and Sexual Activity  Alcohol Use No   Comment: occ.     Social History   Substance and Sexual Activity  Drug Use No    Social History   Socioeconomic History  . Marital status: Widowed    Spouse name: Not on file  . Number of children: 2  . Years of education: Not on file  . Highest  education level: Not on file  Occupational History  . Not on file  Social Needs  . Financial resource strain: Patient refused  . Food insecurity:    Worry: Patient refused    Inability: Patient refused  . Transportation needs:    Medical: Patient refused    Non-medical: Patient refused  Tobacco Use  . Smoking status: Former Smoker    Packs/day: 3.00    Years: 40.00    Pack years: 120.00  . Smokeless tobacco: Never Used  . Tobacco comment: quit in 2009  Substance and Sexual Activity  . Alcohol use: No    Comment: occ.  . Drug use: No  . Sexual activity: Not Currently  Lifestyle  . Physical activity:    Days per week: Patient refused    Minutes per session: Patient refused  . Stress: Patient refused  Relationships  . Social connections:    Talks on phone: Patient refused    Gets together: Patient refused    Attends religious service: Patient refused    Active member of club or organization: Patient refused    Attends meetings of clubs or organizations: Patient refused    Relationship status: Patient refused  Other Topics Concern  . Not on file  Social History Narrative  . Not on file   Additional Social History:  Lives in an assisted living facility/group home  Allergies:   Allergies  Allergen Reactions  . Haldol [Haloperidol Decanoate] Swelling and Other (See Comments)    Reaction:  Swelling of tongue and blurred vision    . Prednisone Other (See Comments)    Unknown reaction  . Raspberry Swelling and Other (See Comments)    Reaction:  Swelling of lips   . Metformin Diarrhea    Labs:  Results for orders placed or performed during the hospital encounter of 07/24/18 (from the past 48 hour(s))  CBC with Differential     Status: Abnormal   Collection Time: 07/24/18  1:19 PM  Result Value Ref Range   WBC 9.5 4.0 - 10.5 K/uL   RBC 4.75 3.87 - 5.11 MIL/uL   Hemoglobin 14.1 12.0 - 15.0 g/dL   HCT 44.8 36.0 - 46.0 %   MCV 94.3 80.0 - 100.0 fL   MCH 29.7 26.0 -  34.0 pg   MCHC 31.5 30.0 - 36.0 g/dL   RDW 14.2 11.5 - 15.5 %   Platelets 233 150 - 400 K/uL   nRBC 0.0 0.0 - 0.2 %   Neutrophils Relative % 41 %  Neutro Abs 3.9 1.7 - 7.7 K/uL   Lymphocytes Relative 45 %   Lymphs Abs 4.2 (H) 0.7 - 4.0 K/uL   Monocytes Relative 13 %   Monocytes Absolute 1.2 (H) 0.1 - 1.0 K/uL   Eosinophils Relative 1 %   Eosinophils Absolute 0.1 0.0 - 0.5 K/uL   Basophils Relative 0 %   Basophils Absolute 0.0 0.0 - 0.1 K/uL   Immature Granulocytes 0 %   Abs Immature Granulocytes 0.04 0.00 - 0.07 K/uL    Comment: Performed at [facility], [address] [place], [place] [address]  Comprehensive metabolic panel     Status: Abnormal   Collection Time: 07/24/18  1:19 PM  Result Value Ref Range   Sodium 140 135 - 145 mmol/L   Potassium 4.2 3.5 - 5.1 mmol/L    Comment: HEMOLYSIS AT THIS LEVEL MAY AFFECT RESULT   Chloride 105 98 - 111 mmol/L   CO2 27 22 - 32 mmol/L   Glucose, Bld 142 (H) 70 - 99 mg/dL   BUN 19 8 - 23 mg/dL   Creatinine, Ser 0.94 0.44 - 1.00 mg/dL   Calcium 8.7 (L) 8.9 - 10.3 mg/dL   Total Protein 7.5 6.5 - 8.1 g/dL   Albumin 3.5 3.5 - 5.0 g/dL   AST 20 15 - 41 U/L   ALT 11 0 - 44 U/L   Alkaline Phosphatase 59 38 - 126 U/L   Total Bilirubin 0.5 0.3 - 1.2 mg/dL   GFR calc non Af Amer >60 >60 mL/min   GFR calc Af Amer >60 >60 mL/min   Anion gap 8 5 - 15    Comment: Performed at [facility], [address] [place], [place] [address]  Troponin I - ONCE - STAT     Status: None   Collection Time: 07/24/18  1:19 PM  Result Value Ref Range   Troponin I <0.03 <0.03 ng/mL    Comment: Performed at [facility], [address] [place], [place] [address]  Glucose, capillary     Status: Abnormal   Collection Time: 07/24/18  1:48 PM  Result Value Ref Range   Glucose-Capillary 130 (H) 70 - 99 mg/dL  Urinalysis, Complete w Microscopic     Status: Abnormal   Collection Time: 07/24/18  2:00 PM  Result Value Ref Range    Color, Urine YELLOW (A) YELLOW   APPearance CLOUDY (A) CLEAR   Specific Gravity, Urine 1.017 1.005 - 1.030   pH 5.0 5.0 - 8.0   Glucose, UA NEGATIVE NEGATIVE mg/dL   Hgb urine dipstick SMALL (A) NEGATIVE   Bilirubin Urine NEGATIVE NEGATIVE   Ketones, ur NEGATIVE NEGATIVE mg/dL   Protein, ur 30 (A) NEGATIVE mg/dL   Nitrite POSITIVE (A) NEGATIVE   Leukocytes,Ua LARGE (A) NEGATIVE   RBC / HPF 11-20 0 - 5 RBC/hpf   WBC, UA >50 (H) 0 - 5 WBC/hpf   Bacteria, UA FEW (A) NONE SEEN   Squamous Epithelial / LPF 0-5 0 - 5   WBC Clumps PRESENT    Mucus PRESENT     Comment: Performed at [facility], [address] [place], [place] [address]  Glucose, capillary     Status: Abnormal   Collection Time: 07/24/18  8:52 PM  Result Value Ref Range   Glucose-Capillary 226 (H) 70 - 99 mg/dL  Glucose, capillary     Status: Abnormal   Collection Time: 07/25/18 11:16 AM  Result Value Ref Range   Glucose-Capillary 188 (  H) 70 - 99 mg/dL    Current Facility-Administered Medications  Medication Dose Route Frequency Provider Last Rate Last Dose  . acetaminophen (TYLENOL) tablet 650 mg  650 mg Oral Q4H PRN Earleen Newport, MD      . albuterol (PROVENTIL) (2.5 MG/3ML) 0.083% nebulizer solution 5 mg  5 mg Inhalation Q6H PRN Earleen Newport, MD      . aspirin EC tablet 81 mg  81 mg Oral Daily Earleen Newport, MD   81 mg at 07/25/18 1107  . atorvastatin (LIPITOR) tablet 20 mg  20 mg Oral QHS Earleen Newport, MD   20 mg at 07/24/18 2049  . benztropine (COGENTIN) tablet 0.5 mg  0.5 mg Oral BID Earleen Newport, MD   0.5 mg at 07/25/18 1104  . carvedilol (COREG) tablet 12.5 mg  12.5 mg Oral BID Earleen Newport, MD   12.5 mg at 07/25/18 1107  . cephALEXin (KEFLEX) capsule 500 mg  500 mg Oral Q6H Earleen Newport, MD   500 mg at 07/25/18 1108  . cholecalciferol (VITAMIN D3) tablet 1,000 Units  1,000 Units Oral Daily Earleen Newport, MD   1,000 Units at 07/25/18 1106  .  insulin aspart (novoLOG) injection 4 Units  4 Units Subcutaneous QID Earleen Newport, MD   4 Units at 07/25/18 1132  . insulin glargine (LANTUS) injection 11 Units  11 Units Subcutaneous QHS Earleen Newport, MD   11 Units at 07/25/18 0003  . levothyroxine (SYNTHROID) tablet 75 mcg  75 mcg Oral Daily Earleen Newport, MD   75 mcg at 07/25/18 0810  . linagliptin (TRADJENTA) tablet 5 mg  5 mg Oral Daily Earleen Newport, MD   5 mg at 07/25/18 1104  . loratadine (CLARITIN) tablet 10 mg  10 mg Oral Daily Earleen Newport, MD   10 mg at 07/25/18 1107  . losartan (COZAAR) tablet 25 mg  25 mg Oral Daily Earleen Newport, MD   25 mg at 07/25/18 1107  . Melatonin TABS 5 mg  5 mg Oral QHS PRN Earleen Newport, MD      . umeclidinium bromide (INCRUSE ELLIPTA) 62.5 MCG/INH 1 puff  1 puff Inhalation Daily Earleen Newport, MD   1 puff at 07/25/18 0900   Current Outpatient Medications  Medication Sig Dispense Refill  . acetaminophen (TYLENOL) 325 MG tablet Take 2 tablets (650 mg total) by mouth every 4 (four) hours as needed for mild pain (temp > 101.5). 30 tablet 2  . albuterol (PROVENTIL HFA;VENTOLIN HFA) 108 (90 Base) MCG/ACT inhaler Inhale 2 puffs into the lungs every 6 (six) hours as needed for wheezing or shortness of breath.    Marland Kitchen aspirin EC 81 MG tablet Take 81 mg by mouth daily.    Marland Kitchen atorvastatin (LIPITOR) 20 MG tablet Take 20 mg by mouth at bedtime.    . benztropine (COGENTIN) 0.5 MG tablet Take 0.5 mg by mouth 2 (two) times daily.    . carvedilol (COREG) 12.5 MG tablet Take 1 tablet (12.5 mg total) by mouth 2 (two) times daily. 60 tablet 0  . cephALEXin (KEFLEX) 500 MG capsule Take 1 capsule (500 mg total) by mouth 4 (four) times daily for 10 days. 40 capsule 0  . cetirizine (ZYRTEC) 10 MG tablet Take 10 mg by mouth daily.    . cholecalciferol (VITAMIN D) 1000 units tablet Take 1,000 Units by mouth daily.    . divalproex (DEPAKOTE) 500 MG DR tablet Take  1 tablet (500  mg total) by mouth 3 (three) times daily. 90 tablet 0  . docusate sodium (COLACE) 100 MG capsule Take 100 mg by mouth 2 (two) times daily.     Marland Kitchen ENSURE MAX PROTEIN (ENSURE MAX PROTEIN) LIQD Take 330 mLs (11 oz total) by mouth 2 (two) times daily. 60 mL 0  . famotidine (PEPCID) 20 MG tablet Take 20 mg by mouth 2 (two) times daily.    . fluPHENAZine (PROLIXIN) 5 MG/ML solution Take 5 mg by mouth 2 (two) times daily.     . Fluticasone-Salmeterol (ADVAIR) 250-50 MCG/DOSE AEPB Inhale 1 puff into the lungs 2 (two) times daily.    . insulin aspart (NOVOLOG) 100 UNIT/ML injection Inject 4 Units into the skin 4 (four) times daily. 10 mL 0  . insulin glargine (LANTUS) 100 UNIT/ML injection Inject 11 Units into the skin at bedtime.     Marland Kitchen levothyroxine (SYNTHROID, LEVOTHROID) 75 MCG tablet Take 1 tablet by mouth daily.    Marland Kitchen losartan (COZAAR) 25 MG tablet Take 25 mg by mouth daily.    . Melatonin (SM MELATONIN) 3 MG TABS Take 3 mg by mouth at bedtime as needed (sleep).    . nitroGLYCERIN (NITROSTAT) 0.4 MG SL tablet Place 0.4 mg under the tongue every 5 (five) minutes as needed for chest pain.    Marland Kitchen oxyCODONE (OXY IR/ROXICODONE) 5 MG immediate release tablet Take 1 tablet (5 mg total) by mouth every 6 (six) hours as needed for moderate pain or severe pain. 20 tablet 0  . sitaGLIPtin (JANUVIA) 50 MG tablet Take 50 mg by mouth daily.    Marland Kitchen umeclidinium bromide (INCRUSE ELLIPTA) 62.5 MCG/INH AEPB Inhale 1 puff into the lungs daily.      Musculoskeletal: Strength & Muscle Tone: decreased Gait & Station: unable to stand per history, not assessed this morning as patient was aroused from sleep for assessment Patient leans: N/A  Psychiatric Specialty Exam: Physical Exam  Nursing note and vitals reviewed. Constitutional: She appears well-developed and well-nourished. No distress.  HENT:  Head: Normocephalic and atraumatic.  Eyes: EOM are normal.  Neck: Normal range of motion.  Cardiovascular: Normal rate and  regular rhythm.  Respiratory: Effort normal. No respiratory distress.  Neurological: She is alert.  Psychiatric: Her behavior is normal. Judgment normal.    Review of Systems  Constitutional: Negative.   HENT: Negative.   Respiratory: Negative.   Cardiovascular: Positive for leg swelling.  Gastrointestinal: Negative.   Musculoskeletal: Positive for falls and joint pain.  Neurological: Negative.   Psychiatric/Behavioral: Positive for memory loss. Negative for depression, hallucinations, substance abuse and suicidal ideas. The patient is not nervous/anxious and does not have insomnia.     Blood pressure (!) 159/84, pulse 66, temperature 98.4 F (36.9 C), temperature source Oral, resp. rate 18, weight 120 kg, SpO2 100 %.Body mass index is 42.7 kg/m.  General Appearance: Fairly Groomed  Eye Contact:  Minimal  Speech:  Clear and Coherent  Volume:  Decreased  Mood:  Euthymic  Affect:  Appropriate  Thought Process:  Coherent  Orientation:  Full (Time, Place, and Person)  Thought Content:  Logical and Hallucinations: None  Suicidal Thoughts:  No  Homicidal Thoughts:  No  Memory:  Immediate;   Good Recent;   Good  Judgement:  Fair  Insight:  Fair  Psychomotor Activity:  Decreased  Concentration:  Concentration: Good and Attention Span: Good  Recall:  Good  Fund of Knowledge:  Good  Language:  Good  Akathisia:  NA  Handed:  Right  AIMS (if indicated):     Assets:  Desire for Improvement Housing Physical Health Social Support  ADL's:  Impaired  Cognition:  WNL  Sleep:   Good     Treatment Plan Summary: Medication management continue home medications with addition of Keflex for treatment of urinary tract infection.  Disposition: No evidence of imminent risk to self or others at present.   Patient does not meet criteria for psychiatric inpatient admission. Supportive therapy provided about ongoing stressors.  She was able to engage in safety planning including plan to  return to nearest emergency room or contact emergency services if she feels unable to maintain her own safety or the safety of others. Patient had no further questions, comments, or concerns.  Discharge into care of caregiver from living facility, who agrees to maintain patient safety.   Lavella Hammock, MD 07/25/2018 11:38 AM

## 2018-07-25 NOTE — ED Notes (Addendum)
Patient took morning medications, she had slept late, v/s obtained, blood sugar obtained, and breakfast served, Patient labile, will continue to monitor, she states she felt short of breath, nurse applied 02 at 2 liters. 02 sat checked prior to applying 02 and was 100%.

## 2018-07-25 NOTE — Social Work (Signed)
CSW consulted with TTS and was informed that plan is to call pt's nursing home, and for her to return there.  TOC CSW signing off.    [place], [place]

## 2018-07-25 NOTE — ED Notes (Signed)
Nurse called report to [facility] and reported to [facility] LPN, Network engineer is calling for transport.

## 2018-07-26 DIAGNOSIS — G309 Alzheimer's disease, unspecified: Secondary | ICD-10-CM | POA: Diagnosis not present

## 2018-07-26 DIAGNOSIS — R293 Abnormal posture: Secondary | ICD-10-CM | POA: Diagnosis not present

## 2018-07-26 DIAGNOSIS — M25512 Pain in left shoulder: Secondary | ICD-10-CM | POA: Diagnosis not present

## 2018-07-26 DIAGNOSIS — J449 Chronic obstructive pulmonary disease, unspecified: Secondary | ICD-10-CM | POA: Diagnosis not present

## 2018-07-26 DIAGNOSIS — I5032 Chronic diastolic (congestive) heart failure: Secondary | ICD-10-CM | POA: Diagnosis not present

## 2018-07-26 DIAGNOSIS — M6281 Muscle weakness (generalized): Secondary | ICD-10-CM | POA: Diagnosis not present

## 2018-07-27 DIAGNOSIS — I5032 Chronic diastolic (congestive) heart failure: Secondary | ICD-10-CM | POA: Diagnosis not present

## 2018-07-27 DIAGNOSIS — M6281 Muscle weakness (generalized): Secondary | ICD-10-CM | POA: Diagnosis not present

## 2018-07-27 DIAGNOSIS — F419 Anxiety disorder, unspecified: Secondary | ICD-10-CM | POA: Diagnosis not present

## 2018-07-27 DIAGNOSIS — G309 Alzheimer's disease, unspecified: Secondary | ICD-10-CM | POA: Diagnosis not present

## 2018-07-27 DIAGNOSIS — F209 Schizophrenia, unspecified: Secondary | ICD-10-CM | POA: Diagnosis not present

## 2018-07-27 DIAGNOSIS — R293 Abnormal posture: Secondary | ICD-10-CM | POA: Diagnosis not present

## 2018-07-27 DIAGNOSIS — R451 Restlessness and agitation: Secondary | ICD-10-CM | POA: Diagnosis not present

## 2018-07-27 DIAGNOSIS — M25512 Pain in left shoulder: Secondary | ICD-10-CM | POA: Diagnosis not present

## 2018-07-27 DIAGNOSIS — J449 Chronic obstructive pulmonary disease, unspecified: Secondary | ICD-10-CM | POA: Diagnosis not present

## 2018-07-27 DIAGNOSIS — F329 Major depressive disorder, single episode, unspecified: Secondary | ICD-10-CM | POA: Diagnosis not present

## 2018-07-28 DIAGNOSIS — G309 Alzheimer's disease, unspecified: Secondary | ICD-10-CM | POA: Diagnosis not present

## 2018-07-28 DIAGNOSIS — M6281 Muscle weakness (generalized): Secondary | ICD-10-CM | POA: Diagnosis not present

## 2018-07-28 DIAGNOSIS — M25512 Pain in left shoulder: Secondary | ICD-10-CM | POA: Diagnosis not present

## 2018-07-28 DIAGNOSIS — R293 Abnormal posture: Secondary | ICD-10-CM | POA: Diagnosis not present

## 2018-07-28 DIAGNOSIS — I5032 Chronic diastolic (congestive) heart failure: Secondary | ICD-10-CM | POA: Diagnosis not present

## 2018-07-28 DIAGNOSIS — J449 Chronic obstructive pulmonary disease, unspecified: Secondary | ICD-10-CM | POA: Diagnosis not present

## 2018-07-29 DIAGNOSIS — R293 Abnormal posture: Secondary | ICD-10-CM | POA: Diagnosis not present

## 2018-07-29 DIAGNOSIS — J449 Chronic obstructive pulmonary disease, unspecified: Secondary | ICD-10-CM | POA: Diagnosis not present

## 2018-07-29 DIAGNOSIS — G309 Alzheimer's disease, unspecified: Secondary | ICD-10-CM | POA: Diagnosis not present

## 2018-07-29 DIAGNOSIS — I5032 Chronic diastolic (congestive) heart failure: Secondary | ICD-10-CM | POA: Diagnosis not present

## 2018-07-29 DIAGNOSIS — M6281 Muscle weakness (generalized): Secondary | ICD-10-CM | POA: Diagnosis not present

## 2018-07-29 DIAGNOSIS — M25512 Pain in left shoulder: Secondary | ICD-10-CM | POA: Diagnosis not present

## 2018-07-30 LAB — URINE CULTURE: Culture: >=100000 — AB

## 2018-07-31 DIAGNOSIS — G309 Alzheimer's disease, unspecified: Secondary | ICD-10-CM | POA: Diagnosis not present

## 2018-07-31 DIAGNOSIS — J449 Chronic obstructive pulmonary disease, unspecified: Secondary | ICD-10-CM | POA: Diagnosis not present

## 2018-07-31 DIAGNOSIS — M25512 Pain in left shoulder: Secondary | ICD-10-CM | POA: Diagnosis not present

## 2018-07-31 DIAGNOSIS — M6281 Muscle weakness (generalized): Secondary | ICD-10-CM | POA: Diagnosis not present

## 2018-07-31 DIAGNOSIS — I5032 Chronic diastolic (congestive) heart failure: Secondary | ICD-10-CM | POA: Diagnosis not present

## 2018-07-31 DIAGNOSIS — R293 Abnormal posture: Secondary | ICD-10-CM | POA: Diagnosis not present

## 2018-08-01 DIAGNOSIS — M6281 Muscle weakness (generalized): Secondary | ICD-10-CM | POA: Diagnosis not present

## 2018-08-01 DIAGNOSIS — J449 Chronic obstructive pulmonary disease, unspecified: Secondary | ICD-10-CM | POA: Diagnosis not present

## 2018-08-01 DIAGNOSIS — I5032 Chronic diastolic (congestive) heart failure: Secondary | ICD-10-CM | POA: Diagnosis not present

## 2018-08-01 DIAGNOSIS — M25512 Pain in left shoulder: Secondary | ICD-10-CM | POA: Diagnosis not present

## 2018-08-01 DIAGNOSIS — R293 Abnormal posture: Secondary | ICD-10-CM | POA: Diagnosis not present

## 2018-08-01 DIAGNOSIS — G309 Alzheimer's disease, unspecified: Secondary | ICD-10-CM | POA: Diagnosis not present

## 2018-08-02 ENCOUNTER — Other Ambulatory Visit: Payer: Self-pay

## 2018-08-02 ENCOUNTER — Encounter (HOSPITAL_COMMUNITY): Payer: Self-pay

## 2018-08-02 ENCOUNTER — Emergency Department (HOSPITAL_COMMUNITY): Payer: Medicare HMO

## 2018-08-02 ENCOUNTER — Inpatient Hospital Stay (HOSPITAL_COMMUNITY)
Admission: EM | Admit: 2018-08-02 | Discharge: 2018-08-23 | DRG: 177 | Disposition: E | Payer: Medicare HMO | Source: Skilled Nursing Facility | Attending: Internal Medicine | Admitting: Internal Medicine

## 2018-08-02 DIAGNOSIS — N189 Chronic kidney disease, unspecified: Secondary | ICD-10-CM | POA: Diagnosis not present

## 2018-08-02 DIAGNOSIS — J9622 Acute and chronic respiratory failure with hypercapnia: Secondary | ICD-10-CM | POA: Diagnosis not present

## 2018-08-02 DIAGNOSIS — J440 Chronic obstructive pulmonary disease with acute lower respiratory infection: Secondary | ICD-10-CM | POA: Diagnosis not present

## 2018-08-02 DIAGNOSIS — E1122 Type 2 diabetes mellitus with diabetic chronic kidney disease: Secondary | ICD-10-CM | POA: Diagnosis present

## 2018-08-02 DIAGNOSIS — Z209 Contact with and (suspected) exposure to unspecified communicable disease: Secondary | ICD-10-CM | POA: Diagnosis not present

## 2018-08-02 DIAGNOSIS — E7800 Pure hypercholesterolemia, unspecified: Secondary | ICD-10-CM | POA: Diagnosis present

## 2018-08-02 DIAGNOSIS — Z87891 Personal history of nicotine dependence: Secondary | ICD-10-CM

## 2018-08-02 DIAGNOSIS — J9690 Respiratory failure, unspecified, unspecified whether with hypoxia or hypercapnia: Secondary | ICD-10-CM | POA: Diagnosis not present

## 2018-08-02 DIAGNOSIS — I499 Cardiac arrhythmia, unspecified: Secondary | ICD-10-CM | POA: Diagnosis not present

## 2018-08-02 DIAGNOSIS — I130 Hypertensive heart and chronic kidney disease with heart failure and stage 1 through stage 4 chronic kidney disease, or unspecified chronic kidney disease: Secondary | ICD-10-CM | POA: Diagnosis present

## 2018-08-02 DIAGNOSIS — R918 Other nonspecific abnormal finding of lung field: Secondary | ICD-10-CM | POA: Diagnosis not present

## 2018-08-02 DIAGNOSIS — J189 Pneumonia, unspecified organism: Secondary | ICD-10-CM | POA: Diagnosis not present

## 2018-08-02 DIAGNOSIS — R0602 Shortness of breath: Secondary | ICD-10-CM

## 2018-08-02 DIAGNOSIS — Z7951 Long term (current) use of inhaled steroids: Secondary | ICD-10-CM | POA: Diagnosis not present

## 2018-08-02 DIAGNOSIS — Z515 Encounter for palliative care: Secondary | ICD-10-CM | POA: Diagnosis not present

## 2018-08-02 DIAGNOSIS — J9602 Acute respiratory failure with hypercapnia: Secondary | ICD-10-CM | POA: Diagnosis present

## 2018-08-02 DIAGNOSIS — C649 Malignant neoplasm of unspecified kidney, except renal pelvis: Secondary | ICD-10-CM | POA: Diagnosis not present

## 2018-08-02 DIAGNOSIS — I491 Atrial premature depolarization: Secondary | ICD-10-CM | POA: Diagnosis not present

## 2018-08-02 DIAGNOSIS — R404 Transient alteration of awareness: Secondary | ICD-10-CM | POA: Diagnosis not present

## 2018-08-02 DIAGNOSIS — J988 Other specified respiratory disorders: Secondary | ICD-10-CM | POA: Diagnosis not present

## 2018-08-02 DIAGNOSIS — J441 Chronic obstructive pulmonary disease with (acute) exacerbation: Secondary | ICD-10-CM | POA: Diagnosis present

## 2018-08-02 DIAGNOSIS — J1289 Other viral pneumonia: Secondary | ICD-10-CM | POA: Diagnosis not present

## 2018-08-02 DIAGNOSIS — J9621 Acute and chronic respiratory failure with hypoxia: Secondary | ICD-10-CM | POA: Diagnosis not present

## 2018-08-02 DIAGNOSIS — F209 Schizophrenia, unspecified: Secondary | ICD-10-CM | POA: Diagnosis not present

## 2018-08-02 DIAGNOSIS — E669 Obesity, unspecified: Secondary | ICD-10-CM | POA: Diagnosis not present

## 2018-08-02 DIAGNOSIS — G9341 Metabolic encephalopathy: Secondary | ICD-10-CM | POA: Diagnosis not present

## 2018-08-02 DIAGNOSIS — R4182 Altered mental status, unspecified: Secondary | ICD-10-CM | POA: Diagnosis not present

## 2018-08-02 DIAGNOSIS — Z66 Do not resuscitate: Secondary | ICD-10-CM | POA: Diagnosis present

## 2018-08-02 DIAGNOSIS — Z6841 Body Mass Index (BMI) 40.0 and over, adult: Secondary | ICD-10-CM

## 2018-08-02 DIAGNOSIS — Z7982 Long term (current) use of aspirin: Secondary | ICD-10-CM

## 2018-08-02 DIAGNOSIS — I5032 Chronic diastolic (congestive) heart failure: Secondary | ICD-10-CM | POA: Diagnosis not present

## 2018-08-02 DIAGNOSIS — U071 COVID-19: Secondary | ICD-10-CM | POA: Diagnosis not present

## 2018-08-02 DIAGNOSIS — E039 Hypothyroidism, unspecified: Secondary | ICD-10-CM | POA: Diagnosis present

## 2018-08-02 DIAGNOSIS — Z8249 Family history of ischemic heart disease and other diseases of the circulatory system: Secondary | ICD-10-CM

## 2018-08-02 DIAGNOSIS — I2510 Atherosclerotic heart disease of native coronary artery without angina pectoris: Secondary | ICD-10-CM | POA: Diagnosis not present

## 2018-08-02 DIAGNOSIS — N390 Urinary tract infection, site not specified: Secondary | ICD-10-CM | POA: Diagnosis present

## 2018-08-02 DIAGNOSIS — Z794 Long term (current) use of insulin: Secondary | ICD-10-CM | POA: Diagnosis not present

## 2018-08-02 DIAGNOSIS — E1165 Type 2 diabetes mellitus with hyperglycemia: Secondary | ICD-10-CM | POA: Diagnosis present

## 2018-08-02 DIAGNOSIS — F0390 Unspecified dementia without behavioral disturbance: Secondary | ICD-10-CM | POA: Diagnosis present

## 2018-08-02 DIAGNOSIS — I517 Cardiomegaly: Secondary | ICD-10-CM | POA: Diagnosis not present

## 2018-08-02 LAB — CBC WITH DIFFERENTIAL/PLATELET
Abs Immature Granulocytes: 0 10*3/uL (ref 0.00–0.07)
Band Neutrophils: 19 %
Basophils Absolute: 0 10*3/uL (ref 0.0–0.1)
Basophils Relative: 0 %
Eosinophils Absolute: 0 10*3/uL (ref 0.0–0.5)
Eosinophils Relative: 0 %
HCT: 43 % (ref 36.0–46.0)
Hemoglobin: 13.4 g/dL (ref 12.0–15.0)
Lymphocytes Relative: 19 %
Lymphs Abs: 1.2 10*3/uL (ref 0.7–4.0)
MCH: 29.6 pg (ref 26.0–34.0)
MCHC: 31.2 g/dL (ref 30.0–36.0)
MCV: 94.9 fL (ref 80.0–100.0)
Monocytes Absolute: 1 10*3/uL (ref 0.1–1.0)
Monocytes Relative: 15 %
Neutro Abs: 4.3 10*3/uL (ref 1.7–7.7)
Neutrophils Relative %: 47 %
Platelets: 155 10*3/uL (ref 150–400)
RBC: 4.53 MIL/uL (ref 3.87–5.11)
RDW: 14.6 % (ref 11.5–15.5)
WBC: 6.5 10*3/uL (ref 4.0–10.5)
nRBC: 0 % (ref 0.0–0.2)

## 2018-08-02 LAB — URINALYSIS, ROUTINE W REFLEX MICROSCOPIC
Bacteria, UA: NONE SEEN
Bilirubin Urine: NEGATIVE
Glucose, UA: NEGATIVE mg/dL
Hgb urine dipstick: NEGATIVE
Ketones, ur: 5 mg/dL — AB
Leukocytes,Ua: NEGATIVE
Nitrite: NEGATIVE
Protein, ur: 30 mg/dL — AB
Specific Gravity, Urine: 1.024 (ref 1.005–1.030)
pH: 5 (ref 5.0–8.0)

## 2018-08-02 LAB — POCT I-STAT EG7
Acid-Base Excess: 4 mmol/L — ABNORMAL HIGH (ref 0.0–2.0)
Acid-Base Excess: 1 mmol/L (ref 0.0–2.0)
Bicarbonate: 34 mmol/L — ABNORMAL HIGH (ref 20.0–28.0)
Bicarbonate: 29.7 mmol/L — ABNORMAL HIGH (ref 20.0–28.0)
Calcium, Ion: 1.19 mmol/L (ref 1.15–1.40)
Calcium, Ion: 1.1 mmol/L — ABNORMAL LOW (ref 1.15–1.40)
HCT: 41 % (ref 36.0–46.0)
HCT: 39 % (ref 36.0–46.0)
Hemoglobin: 13.9 g/dL (ref 12.0–15.0)
Hemoglobin: 13.3 g/dL (ref 12.0–15.0)
O2 Saturation: 56 %
O2 Saturation: 84 %
Potassium: 4.1 mmol/L (ref 3.5–5.1)
Potassium: 4 mmol/L (ref 3.5–5.1)
Sodium: 141 mmol/L (ref 135–145)
Sodium: 142 mmol/L (ref 135–145)
TCO2: 36 mmol/L — ABNORMAL HIGH (ref 22–32)
TCO2: 32 mmol/L (ref 22–32)
pCO2, Ven: 74.1 mmHg (ref 44.0–60.0)
pCO2, Ven: 64 mmHg — ABNORMAL HIGH (ref 44.0–60.0)
pH, Ven: 7.269 (ref 7.250–7.430)
pH, Ven: 7.274 (ref 7.250–7.430)
pO2, Ven: 35 mmHg (ref 32.0–45.0)
pO2, Ven: 57 mmHg — ABNORMAL HIGH (ref 32.0–45.0)

## 2018-08-02 LAB — COMPREHENSIVE METABOLIC PANEL
ALT: 13 U/L (ref 0–44)
AST: 35 U/L (ref 15–41)
Albumin: 2.5 g/dL — ABNORMAL LOW (ref 3.5–5.0)
Alkaline Phosphatase: 42 U/L (ref 38–126)
Anion gap: 10 (ref 5–15)
BUN: 13 mg/dL (ref 8–23)
CO2: 26 mmol/L (ref 22–32)
Calcium: 8.1 mg/dL — ABNORMAL LOW (ref 8.9–10.3)
Chloride: 103 mmol/L (ref 98–111)
Creatinine, Ser: 1.07 mg/dL — ABNORMAL HIGH (ref 0.44–1.00)
GFR calc Af Amer: 60 mL/min — ABNORMAL LOW (ref >60)
GFR calc non Af Amer: 51 mL/min — ABNORMAL LOW (ref >60)
Glucose, Bld: 105 mg/dL — ABNORMAL HIGH (ref 70–99)
Potassium: 4.1 mmol/L (ref 3.5–5.1)
Sodium: 139 mmol/L (ref 135–145)
Total Bilirubin: 0.5 mg/dL (ref 0.3–1.2)
Total Protein: 6.3 g/dL — ABNORMAL LOW (ref 6.5–8.1)

## 2018-08-02 LAB — VALPROIC ACID LEVEL: Valproic Acid Lvl: 50 ug/mL (ref 50.0–100.0)

## 2018-08-02 LAB — BRAIN NATRIURETIC PEPTIDE: B Natriuretic Peptide: 93.2 pg/mL (ref 0.0–100.0)

## 2018-08-02 LAB — LACTIC ACID, PLASMA
Lactic Acid, Venous: 1.1 mmol/L (ref 0.5–1.9)
Lactic Acid, Venous: 1.1 mmol/L (ref 0.5–1.9)

## 2018-08-02 LAB — TSH: TSH: 6.497 u[IU]/mL — ABNORMAL HIGH (ref 0.350–4.500)

## 2018-08-02 LAB — CBG MONITORING, ED: Glucose-Capillary: 121 mg/dL — ABNORMAL HIGH (ref 70–99)

## 2018-08-02 LAB — SARS CORONAVIRUS 2: SARS Coronavirus 2: DETECTED — AB

## 2018-08-02 MED ORDER — ACETAMINOPHEN 650 MG RE SUPP
650.0000 mg | Freq: Once | RECTAL | Status: AC
  Administered 2018-08-02: 650 mg via RECTAL
  Filled 2018-08-02: qty 1

## 2018-08-02 MED ORDER — FENTANYL CITRATE (PF) 100 MCG/2ML IJ SOLN: 25.0000 ug | Freq: Once | INTRAMUSCULAR | Status: DC

## 2018-08-02 MED ORDER — VANCOMYCIN HCL 10 G IV SOLR
1250.0000 mg | INTRAVENOUS | Status: DC
  Filled 2018-08-02: qty 1250

## 2018-08-02 MED ORDER — MIDAZOLAM HCL 2 MG/2ML IJ SOLN: 1.0000 mg | INTRAMUSCULAR | Status: DC | PRN

## 2018-08-02 MED ORDER — HEPARIN SODIUM (PORCINE) 5000 UNIT/ML IJ SOLN
5000.0000 [IU] | Freq: Three times a day (TID) | INTRAMUSCULAR | Status: DC
  Administered 2018-08-02 – 2018-08-03 (×3): 5000 [IU] via SUBCUTANEOUS
  Filled 2018-08-02 (×3): qty 1

## 2018-08-02 MED ORDER — SODIUM CHLORIDE 0.9 % IV SOLN
2.0000 g | Freq: Three times a day (TID) | INTRAVENOUS | Status: DC
  Administered 2018-08-02 – 2018-08-03 (×2): 2 g via INTRAVENOUS
  Filled 2018-08-02 (×3): qty 2

## 2018-08-02 MED ORDER — LEVOTHYROXINE SODIUM 100 MCG/5ML IV SOLN
37.5000 ug | Freq: Every day | INTRAVENOUS | Status: DC
  Administered 2018-08-03: 37.5 ug via INTRAVENOUS
  Filled 2018-08-02: qty 5

## 2018-08-02 MED ORDER — SODIUM CHLORIDE 0.9 % IV SOLN
500.0000 mg | Freq: Once | INTRAVENOUS | Status: AC
  Administered 2018-08-02: 500 mg via INTRAVENOUS
  Filled 2018-08-02: qty 500

## 2018-08-02 MED ORDER — PANTOPRAZOLE SODIUM 40 MG IV SOLR
40.0000 mg | Freq: Every day | INTRAVENOUS | Status: DC
  Administered 2018-08-02: 40 mg via INTRAVENOUS
  Filled 2018-08-02: qty 40

## 2018-08-02 MED ORDER — FENTANYL 2500MCG IN NS 250ML (10MCG/ML) PREMIX INFUSION: 25.0000 ug/h | INTRAVENOUS | Status: DC

## 2018-08-02 MED ORDER — METHYLPREDNISOLONE SODIUM SUCC 40 MG IJ SOLR
40.0000 mg | Freq: Two times a day (BID) | INTRAMUSCULAR | Status: DC
  Administered 2018-08-02 – 2018-08-05 (×6): 40 mg via INTRAVENOUS
  Filled 2018-08-02 (×6): qty 1

## 2018-08-02 MED ORDER — SODIUM CHLORIDE 0.9 % IV SOLN
1.0000 g | Freq: Once | INTRAVENOUS | Status: AC
  Administered 2018-08-02: 1 g via INTRAVENOUS
  Filled 2018-08-02: qty 10

## 2018-08-02 MED ORDER — ATORVASTATIN CALCIUM 10 MG PO TABS
20.0000 mg | ORAL_TABLET | Freq: Every day | ORAL | Status: DC
  Administered 2018-08-02 – 2018-08-05 (×4): 20 mg
  Filled 2018-08-02 (×4): qty 2

## 2018-08-02 MED ORDER — ASPIRIN 81 MG PO CHEW
81.0000 mg | CHEWABLE_TABLET | Freq: Every day | ORAL | Status: DC
  Administered 2018-08-02 – 2018-08-06 (×5): 81 mg
  Filled 2018-08-02 (×5): qty 1

## 2018-08-02 MED ORDER — SODIUM CHLORIDE 0.9 % IV SOLN
INTRAVENOUS | Status: DC
  Administered 2018-08-02: 11:00:00 via INTRAVENOUS

## 2018-08-02 MED ORDER — VALPROATE SODIUM 500 MG/5ML IV SOLN
500.0000 mg | Freq: Three times a day (TID) | INTRAVENOUS | Status: DC
  Administered 2018-08-02 – 2018-08-03 (×3): 500 mg via INTRAVENOUS
  Filled 2018-08-02 (×7): qty 5

## 2018-08-02 MED ORDER — INSULIN ASPART 100 UNIT/ML ~~LOC~~ SOLN
0.0000 [IU] | SUBCUTANEOUS | Status: DC
  Administered 2018-08-03: 2 [IU] via SUBCUTANEOUS

## 2018-08-02 MED ORDER — VANCOMYCIN HCL 10 G IV SOLR
2000.0000 mg | Freq: Once | INTRAVENOUS | Status: AC
  Administered 2018-08-02: 2000 mg via INTRAVENOUS
  Filled 2018-08-02: qty 2000

## 2018-08-02 MED ORDER — FENTANYL BOLUS VIA INFUSION
25.0000 ug | INTRAVENOUS | Status: DC | PRN
  Filled 2018-08-02: qty 25

## 2018-08-02 NOTE — ED Notes (Signed)
Culture sent with UA 

## 2018-08-02 NOTE — ED Notes (Signed)
Son [name] was updated just now via phone

## 2018-08-02 NOTE — ED Notes (Signed)
Notified Dr. Maryan Rued of pt's VBG results

## 2018-08-02 NOTE — ED Provider Notes (Signed)
[facility] EMERGENCY DEPARTMENT Provider Note   CSN: [id number] Arrival date & time: 08/21/2018  1000    History   Chief Complaint Chief Complaint  Patient presents with  . Fever    HPI [name] [name] is a 73 y.o. female.     Is a 73 year old female with a history of schizophrenia, hypertension, COPD, CHF, CAD, CKD who is presenting today from her nursing home for altered mental status and low-grade fever of 99-100 starting yesterday.  Patient normally is able to communicate normally and was last seen a few days ago in the emergency room for psychiatric issues and discharged home after starting treatment for UTI with Keflex.  EMS found that patient was satting 85% on room air which improved 95% on 2 L.  Nursing home denies cough or congestion.  On exam here patient will moan and occasionally say a few words but does not answer any questions.  The history is provided by the EMS personnel and the nursing home.    Past Medical History:  Diagnosis Date  . Anxiety   . Arthritis   . Bronchitis   . Bursitis   . CAD (coronary artery disease)   . CHF (congestive heart failure) ([place])   . Chronic cough   . Chronic kidney disease    shadow on x-ray  . COPD (chronic obstructive pulmonary disease) ([place])   . Depression   . DM2 (diabetes mellitus, type 2) ([place])   . Environmental allergies   . GERD (gastroesophageal reflux disease)   . Hypercholesteremia   . Hypertension   . Left ventricular outflow tract obstruction    a. echo 03/2014: EF 60-65%, hypernamic LV systolic fxn, mod LVH w/ LVOT gradient estimated at 68 mm Hg w/ valsalva, very small LV internal cavity size, mildly increased LV posterior wall thickness, mild Ao valve scl w/o stenosis, diastolic dysfunction, normal RVSP  . Lower extremity edema   . LVH (left ventricular hypertrophy)    a. echo suggests long standing uncontrolled htn. she will not do well when dehydrated, LV cavity obliteration  . Muscle  weakness   . Obesity   . On supplemental oxygen therapy    AS NEEDED  . OSA (obstructive sleep apnea)    does not use machine  . Osteoarthritis   . Schizophrenia ([place])   . Tremors of nervous system   . Wheezing     Patient Active Problem List   Diagnosis Date Noted  . Dementia without behavioral disturbance ([place])   . Dehydration   . Decubitus ulcer of sacral region, stage 1   . Goals of care, counseling/discussion   . Palliative care by specialist   . Elevated troponin 03/27/2018  . AMS (altered mental status) 02/14/2018  . Pressure injury of skin 11/08/2017  . Acute and chronic respiratory failure with hypercapnia ([place]) 11/05/2017  . Acute renal failure (ARF) ([place]) 08/05/2017  . ARF (acute renal failure) ([place]) 08/03/2017  . UTI (urinary tract infection) 01/16/2016  . Altered mental status 01/16/2016  . Metabolic encephalopathy   . Acute on chronic respiratory failure with hypoxia and hypercapnia ([place]) 10/13/2015  . Involuntary commitment 09/02/2015  . Schizoaffective disorder, bipolar type ([place])   . Urinary incontinence 10/30/2014  . GERD (gastroesophageal reflux disease) 10/30/2014  . OSA (obstructive sleep apnea) 10/30/2014  . Osteoarthrosis, unspecified whether generalized or localized, involving lower leg 10/30/2014  . COPD (chronic obstructive pulmonary disease) ([place]) 10/30/2014  . Chronic diastolic CHF (congestive heart failure) ([place]) 05/15/2014  .  Morbid obesity ([place])   . History of colon polyps 03/09/2013  . Renal mass 06/26/2011  . Lytic bone lesion of hip 06/25/2011  . Hypertension 06/24/2011  . Diabetes mellitus ([place]) 06/24/2011    Past Surgical History:  Procedure Laterality Date  . CATARACT EXTRACTION W/PHACO Left 09/10/2014   Procedure: CATARACT EXTRACTION PHACO AND INTRAOCULAR LENS PLACEMENT (IOC);  Surgeon: Birder Robson, MD;  Location: [facility] ORS;  Service: Ophthalmology;  Laterality: Left;  [place]: 00:44 AP%: 22.8 CDE: 10.24 Fluid lot #[id number]  .  CATARACT EXTRACTION W/PHACO Right 10/15/2014   Procedure: CATARACT EXTRACTION PHACO AND INTRAOCULAR LENS PLACEMENT (IOC);  Surgeon: Birder Robson, MD;  Location: [facility] ORS;  Service: Ophthalmology;  Laterality: Right;  [place]: 00:52 AP:40.1 CDE:11.83 LOT PACK #[id number]  . CHOLECYSTECTOMY    . COLONOSCOPY  04/2011   [facility] per patient incomplete  . EYE SURGERY    . JOINT REPLACEMENT     TKR  . KNEE RECONSTRUCTION, MEDIAL PATELLAR FEMORAL LIGAMENT    . RENAL BIOPSY    . TONSILLECTOMY    . TONSILLECTOMY       OB History    Gravida  2   Para      Term      Preterm      AB      Living  2     SAB      TAB      Ectopic      Multiple      Live Births               Home Medications    Prior to Admission medications   Medication Sig Start Date End Date Taking? Authorizing Provider  acetaminophen (TYLENOL) 325 MG tablet Take 2 tablets (650 mg total) by mouth every 4 (four) hours as needed for mild pain (temp > 101.5). 10/20/15   Gladstone Lighter, MD  albuterol (PROVENTIL HFA;VENTOLIN HFA) 108 (90 Base) MCG/ACT inhaler Inhale 2 puffs into the lungs every 6 (six) hours as needed for wheezing or shortness of breath.    [provider]  aspirin EC 81 MG tablet Take 81 mg by mouth daily.    [provider]  atorvastatin (LIPITOR) 20 MG tablet Take 20 mg by mouth at bedtime.    [provider]  benztropine (COGENTIN) 0.5 MG tablet Take 0.5 mg by mouth 2 (two) times daily.    [provider]  carvedilol (COREG) 12.5 MG tablet Take 1 tablet (12.5 mg total) by mouth 2 (two) times daily. 11/08/17   Salary, [name], MD  cephALEXin (KEFLEX) 500 MG capsule Take 1 capsule (500 mg total) by mouth 4 (four) times daily for 10 days. 07/24/18 08/03/18  Earleen Newport, MD  cetirizine (ZYRTEC) 10 MG tablet Take 10 mg by mouth daily.    [provider]  cholecalciferol (VITAMIN D) 1000 units tablet Take 1,000 Units by mouth daily.    [provider]  divalproex (DEPAKOTE) 500 MG DR tablet Take 1 tablet (500 mg total) by mouth 3 (three) times daily. 08/15/17   Loletha Grayer, MD  docusate sodium (COLACE) 100 MG capsule Take 100 mg by mouth 2 (two) times daily.     [provider]  ENSURE MAX PROTEIN (ENSURE MAX PROTEIN) LIQD Take 330 mLs (11 oz total) by mouth 2 (two) times daily. 04/04/18   Stark Jock Jude, MD  famotidine (PEPCID) 20 MG tablet Take 20 mg by mouth 2 (two) times daily.    [provider]  fluPHENAZine (PROLIXIN) 5 MG/ML solution Take 5 mg by mouth 2 (two) times daily.     [provider]  Fluticasone-Salmeterol (ADVAIR) 250-50 MCG/DOSE AEPB Inhale 1 puff into the lungs 2 (two) times daily.    [provider]  insulin aspart (NOVOLOG) 100 UNIT/ML injection Inject 4 Units into the skin 4 (four) times daily. 02/16/18   Mayo, Pete Pelt, MD  insulin glargine (LANTUS) 100 UNIT/ML injection Inject 11 Units into the skin at bedtime.     [provider]  levothyroxine (SYNTHROID, LEVOTHROID) 75 MCG tablet Take 1 tablet by mouth daily. 04/05/17   [provider]  losartan (COZAAR) 25 MG tablet Take 25 mg by mouth daily.    [provider]  Melatonin (SM MELATONIN) 3 MG TABS Take 3 mg by mouth at bedtime as needed (sleep).    [provider]  nitroGLYCERIN (NITROSTAT) 0.4 MG SL tablet Place 0.4 mg under the tongue every 5 (five) minutes as needed for chest pain.    [provider]  oxyCODONE (OXY IR/ROXICODONE) 5 MG immediate release tablet Take 1 tablet (5 mg total) by mouth every 6 (six) hours as needed for moderate pain or severe pain. 04/04/18   Ojie, Jude, MD  sitaGLIPtin (JANUVIA) 50 MG tablet Take 50 mg by mouth daily.    [provider]  umeclidinium bromide (INCRUSE ELLIPTA) 62.5 MCG/INH AEPB Inhale 1 puff into the lungs daily.    [provider]    Family History Family History  Problem Relation Age of Onset  . Heart  attack Mother   . Colon cancer Neg Hx   . Liver disease Neg Hx     Social History Social History   Tobacco Use  . Smoking status: Former Smoker    Packs/day: 3.00    Years: 40.00    Pack years: 120.00  . Smokeless tobacco: Never Used  . Tobacco comment: quit in 2009  Substance Use Topics  . Alcohol use: No    Comment: occ.  . Drug use: No     Allergies   Haldol [haloperidol decanoate]; Prednisone; Raspberry; and Metformin   Review of Systems Review of Systems  Unable to perform ROS: Mental status change     Physical Exam Updated Vital Signs Pulse 62   Temp (!) 101.4 F (38.6 C) (Rectal)   Resp (!) 27   Ht 5\' 6"  (1.676 m)   Wt 120 kg   SpO2 95%   BMI 42.70 kg/m   Physical Exam Vitals signs and nursing note reviewed.  Constitutional:      General: She is in acute distress.     Appearance: She is well-developed.  HENT:     Head: Normocephalic and atraumatic.     Mouth/Throat:     Mouth: Mucous membranes are dry.  Eyes:     Extraocular Movements: Extraocular movements intact.     Pupils: Pupils are equal, round, and reactive to light.  Cardiovascular:     Rate and Rhythm: Normal rate and regular rhythm.     Heart sounds: Normal heart sounds. No murmur. No friction rub.  Pulmonary:     Effort: Pulmonary effort is normal.     Breath sounds: Rhonchi present. No wheezing or rales.     Comments: Rhonchi and coarse breath sounds throughout Abdominal:     General: Bowel sounds are normal. There is no distension.     Palpations: Abdomen is soft.     Tenderness: There is no abdominal  tenderness. There is no guarding or rebound.  Musculoskeletal: Normal range of motion.        General: No tenderness.     Right lower leg: Edema present.     Left lower leg: Edema present.     Comments: 1+ pitting edema bilaterally  Skin:    General: Skin is warm and dry.     Findings: No rash.  Neurological:     Mental Status: She is alert.     Comments: Patient is  lethargic but will open her eyes when her name is called and when having to move her to clean her sheets she does moan and say help me.  She is not following commands at this time  Psychiatric:     Comments: Pt lethargic      ED Treatments / Results  Labs (all labs ordered are listed, but only abnormal results are displayed) Labs Reviewed  SARS CORONAVIRUS 2 - Abnormal; Notable for the following components:      Result Value   SARS Coronavirus 2 DETECTED (*)    All other components within normal limits  COMPREHENSIVE METABOLIC PANEL - Abnormal; Notable for the following components:   Glucose, Bld 105 (*)    Creatinine, Ser 1.07 (*)    Calcium 8.1 (*)    Total Protein 6.3 (*)    Albumin 2.5 (*)    GFR calc non Af Amer 51 (*)    GFR calc Af Amer 60 (*)    All other components within normal limits  URINALYSIS, ROUTINE W REFLEX MICROSCOPIC - Abnormal; Notable for the following components:   APPearance HAZY (*)    Ketones, ur 5 (*)    Protein, ur 30 (*)    All other components within normal limits  POCT I-STAT EG7 - Abnormal; Notable for the following components:   pCO2, Ven 74.1 (*)    Bicarbonate 34.0 (*)    TCO2 36 (*)    Acid-Base Excess 4.0 (*)    All other components within normal limits  CULTURE, BLOOD (ROUTINE X 2)  CULTURE, BLOOD (ROUTINE X 2)  URINE CULTURE  LACTIC ACID, PLASMA  LACTIC ACID, PLASMA  CBC WITH DIFFERENTIAL/PLATELET  BRAIN NATRIURETIC PEPTIDE  VALPROIC ACID LEVEL  I-STAT VENOUS BLOOD GAS, ED    EKG EKG Interpretation  Date/Time:  Wednesday August 02 2018 10:28:44 EDT Ventricular Rate:  104 PR Interval:    QRS Duration: 75 QT Interval:  404 QTC Calculation: 532 R Axis:   15 Text Interpretation:  new Sinus tachycardia with irregular rate Nonspecific T abnormalities, lateral leads Prolonged QT interval Confirmed by Blanchie Dessert ([id number]) on 08/03/2018 1:52:18 PM   Radiology Dg Chest Port 1 View  Result Date: 08/06/2018 CLINICAL DATA:   Altered mental status.  Hypoxia. EXAM: PORTABLE CHEST 1 VIEW COMPARISON:  One-view chest x-ray 03/26/2018 FINDINGS: The heart is enlarged. Lung volumes are low. Patchy bilateral airspace opacities are present. No significant focal consolidation is present. There is no edema or effusion. Visualized soft tissues and bony thorax are unremarkable. IMPRESSION: 1. Stable cardiomegaly without failure. 2. Patchy bilateral airspace disease. This is concerning for atypical infection. Edema is considered less likely. 3. Decreased lung volumes. Electronically Signed   By: San Morelle M.D.   On: 07/24/2018 11:53    Procedures Procedures (including critical care time)  Medications Ordered in ED Medications - No data to display   Initial Impression / Assessment and Plan / ED Course  I have reviewed the triage vital signs  and the nursing notes.  Pertinent labs & imaging results that were available during my care of the patient were reviewed by me and considered in my medical decision making (see chart for details).        Elderly female with multiple medical problems who lives at [facility] presenting today with altered mental status and fever.  Patient's temperature is 101.4 rectally here and also was noted to be satting 85% on room air when EMS arrived which improved with 2 L of oxygen.  She has coarse breath sounds throughout some concern for possible pneumonia but also recent evaluation in the emergency room with psychiatric evaluation and found to have a UTI and was placed on Keflex.  Patient takes no mind altering medications such as benzos or narcotics but is on Depakote so we will check a level to ensure that is not causing her mental status changes.  Code sepsis was initiated and patient was started on IV fluids but due to her history of CHF and no evidence of shock at this time will wait for lactate for boluses.   2:13 PM Pt's labs with new hypercarbia of 74 which is probably  cause of AMS and also covid positive. Given positive covid and hypercarbia will need to intubate as she can't have bipap.  UA wnl.  CXR with pna and covered with abx. Lactate wnl.  BNP wnl.  EKG without signs of stemi.  [name] [name] was evaluated in Emergency Department on 08/07/2018 for the symptoms described in the history of present illness. She was evaluated in the context of the global COVID-19 pandemic, which necessitated consideration that the patient might be at risk for infection with the SARS-CoV-2 virus that causes COVID-19. Institutional protocols and algorithms that pertain to the evaluation of patients at risk for COVID-19 are in a state of rapid change based on information released by regulatory bodies including the CDC and federal and state organizations. These policies and algorithms were followed during the patient's care in the ED.  3:27 PM On re-eval prior to intubation pt is now much more wake and answering questions appropriately.  Repeat gas showed improved CO2 of 64.  Will discuss with critical care but may be able to observe and delay intubation.  CRITICAL CARE Performed by: Freddy Spadafora Total critical care time: 30 minutes Critical care time was exclusive of separately billable procedures and treating other patients. Critical care was necessary to treat or prevent imminent or life-threatening deterioration. Critical care was time spent personally by me on the following activities: development of treatment plan with patient and/or surrogate as well as nursing, discussions with consultants, evaluation of patient's response to treatment, examination of patient, obtaining history from patient or surrogate, ordering and performing treatments and interventions, ordering and review of laboratory studies, ordering and review of radiographic studies, pulse oximetry and re-evaluation of patient's condition.   Final Clinical Impressions(s) / ED Diagnoses   Final diagnoses:   COVID-19  Acute respiratory failure with hypercapnia ([place])  Community acquired pneumonia, unspecified laterality    ED Discharge Orders    None       Blanchie Dessert, MD 08/07/2018 1529

## 2018-08-02 NOTE — Progress Notes (Signed)
Pharmacy Antibiotic Note  [name] [name] is a 73 y.o. female admitted on 08/20/2018 with COVID pneumonia.  Patient started on [place] and sats improved, WBC 6.5, Tmax 101.4, CXR with bilateral patchy airspace disease. Scr at baseline 1.07 with CrCl 25mL/min. Pharmacy has been consulted for Vancomycin dosing for 3 day length of therapy.  Plan: Vancomycin 2000mg  x1 loading dose Vancomycin 1250mg  q24hrs x2 more days Estimated AUC 511 Cefepime 2g q8hrs per MD Monitor renal function, culture data, LOT  Height: 5\' 6"  (167.6 cm) Weight: 264 lb 8.8 oz (120 kg) IBW/kg (Calculated) : 59.3  Temp (24hrs), Avg:101.4 F (38.6 C), Min:101.4 F (38.6 C), Max:101.4 F (38.6 C)  Recent Labs  Lab 08/03/2018 1055 08/07/2018 1314  WBC 6.5  --   CREATININE 1.07*  --   LATICACIDVEN 1.1 1.1    Estimated Creatinine Clearance: 61.8 mL/min (A) (by C-G formula based on SCr of 1.07 mg/dL (H)).    Allergies  Allergen Reactions  . Haldol [Haloperidol Decanoate] Swelling and Other (See Comments)    Reaction:  Swelling of tongue and blurred vision    . Prednisone Other (See Comments)    Unknown reaction  . Raspberry Swelling and Other (See Comments)    Reaction:  Swelling of lips   . Metformin Diarrhea    Antimicrobials this admission: Vancomycin 6/10 >> Cefepime 6/10 >> Azithro 6/10  Thank you for involving pharmacy in this patient's care.  Janae Bridgeman, PharmD PGY1 Pharmacy Resident Phone: [phone] 08/19/2018 4:07 PM

## 2018-08-02 NOTE — Progress Notes (Signed)
Report recvd from [facility] in ED at 1630. Update from Dr. Lake Bells at [phone] that patient is now DNR including no intubation (previously DNR except intubation permissible). Patient amenable to use of Bipap if needed.

## 2018-08-02 NOTE — ED Notes (Signed)
purewick placed

## 2018-08-02 NOTE — ED Notes (Signed)
Attempted to call report to [facility] ICU for room [phone]

## 2018-08-02 NOTE — Progress Notes (Signed)
[facility] PCCM Attending, [facility] Chief Medical Officer  I have reviewed [name] [name]' record extensively, I discussed her situation today with Dr. Ruthann Cancer, and have talked to her son at length this evening.  She has yet to arrive at our campus as she is awaiting transport.  Fortunately she did not require intubation in the emergency room and her hypercarbia and mental status have improved to the point that she no longer needs intubation.  She is currently resting comfortably on 4 L nasal cannula per our report that we received from the [facility] emergency nursing staff.  In review of her records I see that she was made a DO NOT RESUSCITATE in February 2020 with conversation with her son.  This was in regards to her multiple comorbid illnesses including a very likely renal cell carcinoma, dementia, advanced mental health problems, heart failure.  I discussed the situation with [name] [name] today (her son) and advised that I think it is reasonable to give her Solu-Medrol to try to slow the progression of the inflammatory damage from COVID.  However, given her multiple comorbid illnesses I do not feel that it is appropriate to administer remdesivir as her pre-COVID likelihood of surviving 6 months is quite low.    I asked him if he would be willing to consider using Actemra to slow the progression of the inflammatory damage.  I explained that this would be off label use of a medicine which was designed to be used in the setting of inflammatory/rheumatologic illnesses and has yet to have proven benefit in COVID.  I advised that this could possibly increase her risk of infection.  After consideration he said he is not interested in this.  We then discussed her respiratory status and various ways we can approach it.  I said that I think the best approach considering her hypercarbia and COPD would be to consider a short trial of noninvasive mechanical ventilation should her respiratory condition worsen  overnight.  However, I would not pursue invasive mechanical ventilation which would require sedation, endotracheal tube placement etc.  He agrees with this and understands that her CODE STATUS is now DNR.  [name], MD [facility] PCCM Pager: [phone] Cell: [phone] If no response, call [phone]

## 2018-08-02 NOTE — ED Notes (Signed)
Pt was rolled to her Rt side, propped up with pillows to ease the pain from her "bottom."

## 2018-08-02 NOTE — ED Triage Notes (Signed)
Pt was reported to have fevers for the past two days at [facility] home, pt is not speaking to staff upon arrival but responding to staff moving & rolling her on bed, she has 2L O2 via n/c on & is 97%, at the Nursing home they reported that she was sating in the 80's.

## 2018-08-02 NOTE — H&P (Signed)
NAME:  [name] [name], MRN:  [id number], DOB:  1945/11/21, LOS: 0 ADMISSION DATE:  07/31/2018, CONSULTATION DATE:  08/05/2018 REFERRING MD:  Dr Maryan Rued, CHIEF COMPLAINT:  Acute hypercarbic resp failure, COVID +  Brief History   73 yo aa f from snf and covid POSITIVE presented with ams/fever and hypoxia found to be hypercarbic req intubation.   History of present illness   73 yo aa f from snf with pmh of COPD, Depression, DM2, HFpEF, schizophrenia presents with 2 day history of ams/fever found to be covid positive at snf. Pt reportedly is normally able to communicate but wunable this am. At time EMS presented pt was found hypoxic to 85% on RA and improved with 2-3L to 95%. There were no reports of cough or congestion. When she arrived to ED she was somnolent req 3L [place]. ABG revealed 7.26/74/35/36 and due to covid + status decision was made to intubate pt rather than NIV use to help clear CO2. Pt is unable to provide any history so all history is obtained from chart review and Dr Maryan Rued.   Past Medical History   Past Medical History:  Diagnosis Date  . Anxiety   . Arthritis   . Bronchitis   . Bursitis   . CAD (coronary artery disease)   . CHF (congestive heart failure) ([place])   . Chronic cough   . Chronic kidney disease    shadow on x-ray  . COPD (chronic obstructive pulmonary disease) ([place])   . Depression   . DM2 (diabetes mellitus, type 2) ([place])   . Environmental allergies   . GERD (gastroesophageal reflux disease)   . Hypercholesteremia   . Hypertension   . Left ventricular outflow tract obstruction    a. echo 03/2014: EF 60-65%, hypernamic LV systolic fxn, mod LVH w/ LVOT gradient estimated at 68 mm Hg w/ valsalva, very small LV internal cavity size, mildly increased LV posterior wall thickness, mild Ao valve scl w/o stenosis, diastolic dysfunction, normal RVSP  . Lower extremity edema   . LVH (left ventricular hypertrophy)    a. echo suggests long standing uncontrolled htn. she  will not do well when dehydrated, LV cavity obliteration  . Muscle weakness   . Obesity   . On supplemental oxygen therapy    AS NEEDED  . OSA (obstructive sleep apnea)    does not use machine  . Osteoarthritis   . Schizophrenia ([place])   . Tremors of nervous system   . Wheezing     Significant Hospital Events   6/10: admitted to hospital and intubated  Consults:    Procedures:  6/10 ett->  Significant Diagnostic Tests:    Micro Data:  6/10 urine-> 6/10 blood-> Antimicrobials:  6/10 ctx 6/10 azith  Interim history/subjective:  6/10 intubated in ed for hypercarbia  Objective   Blood pressure 137/88, pulse 81, temperature (!) 101.4 F (38.6 C), temperature source Rectal, resp. rate (!) 25, height 5\' 6"  (1.676 m), weight 120 kg, SpO2 97 %.       No intake or output data in the 24 hours ending 08/01/2018 1426 Filed Weights   08/12/2018 1023  Weight: 120 kg    Examination: General: no acute distress,somnolent minimally responsive HEENT: NCAT,PERRLA, MMMP Lungs: rhonchi bilaterally, no rales  Cardiovascular: RRR, no m/g/r Abdomen: soft, NT,ND, BS+ Extremities: + 1 edema Skin: no rashes, warm and dry Neuro: moves all 4 extremities to painful stim, not following commands.  GU: purwick in place  Resolved Hospital Problem list  n/a  Assessment & Plan:  Acute hypoxic/hypercarbic resp failure: covid + with bilateral infiltrates, POA Presumed hcap as well gn/gp, POA COPD P: -titrate vent for sat/sbt -repeat abg -given ctx and azithro in ed despite from SNF, warrants hcap coverage in setting of b/l infiltrates with likely cefepime and vanc.  -will add cefepime and vanc for 3 days empiric with fever (no elevated wbc) -obtain sputum cx, may be able to d/c sooner, obviously could just be covid -transfer to [facility] ICU -bronchodilators  Sepsis 2/2 covid +/- HCAP, POA  P:  -as above  DM2 with Hyperglycemia P: -ssi  Schizophrenia:  P: -cont home meds -check  depakote level  Hypothyroidism:  P:  Levothyroxine  HFpEF HTN P:  -home meds  Recent UTI P:  Repeat ua/cx sent On cefepime and should have completed or near completed keflex based on dates.    Goals of care:  Pending d/w family but per NH no DNR to their knowledge. Pt with chronic comorbid conditions from NH and certainly warrants that discussion esp in light of potential rapid decompensation.   Best practice:  Diet: npo Pain/Anxiety/Delirium protocol (if indicated): per protocol VAP protocol (if indicated): per protocol DVT prophylaxis: heparin GI prophylaxis: ppi Glucose control: ssi Mobility: bed Code Status: full Family Communication: unable to reach Disposition: ICU at [place]: Recent Labs  Lab 08/03/2018 1055 08/21/2018 1309  WBC 6.5  --   NEUTROABS 4.3  --   HGB 13.4 13.9  HCT 43.0 41.0  MCV 94.9  --   PLT 155  --     Basic Metabolic Panel: Recent Labs  Lab 07/28/2018 1055 08/11/2018 1309  NA 139 141  K 4.1 4.1  CL 103  --   CO2 26  --   GLUCOSE 105*  --   BUN 13  --   CREATININE 1.07*  --   CALCIUM 8.1*  --    GFR: Estimated Creatinine Clearance: 61.8 mL/min (A) (by C-G formula based on SCr of 1.07 mg/dL (H)). Recent Labs  Lab 08/20/2018 1055 08/18/2018 1314  WBC 6.5  --   LATICACIDVEN 1.1 1.1    Liver Function Tests: Recent Labs  Lab 08/01/2018 1055  AST 35  ALT 13  ALKPHOS 42  BILITOT 0.5  PROT 6.3*  ALBUMIN 2.5*   No results for input(s): LIPASE, AMYLASE in the last 168 hours. No results for input(s): AMMONIA in the last 168 hours.  ABG    Component Value Date/Time   PHART 7.38 11/05/2017 0419   PCO2ART 73 (HH) 11/05/2017 0419   PO2ART 76 (L) 11/05/2017 0419   HCO3 34.0 (H) 08/19/2018 1309   TCO2 36 (H) 08/18/2018 1309   O2SAT 56.0 07/29/2018 1309     Coagulation Profile: No results for input(s): INR, PROTIME in the last 168 hours.  Cardiac Enzymes: No results for input(s): CKTOTAL, CKMB, CKMBINDEX, TROPONINI in  the last 168 hours.  HbA1C: Hemoglobin A1C  Date/Time Value Ref Range Status  06/20/2014 03:40 AM 9.7 (H) % Final    Comment:    4.0-6.0 NOTE: New Reference Range  04/30/14   06/08/2012 08:22 AM 9.1 (H) 4.2 - 6.3 % Final    Comment:    The American Diabetes Association recommends that a primary goal of therapy should be <7% and that physicians should reevaluate the treatment regimen in patients with HbA1c values consistently >8%.    Hgb A1c MFr Bld  Date/Time Value Ref Range Status  10/13/2015 05:37 AM  7.6 (H) 4.0 - 6.0 % Final  10/28/2014 05:08 PM 8.3 (H) 4.0 - 6.0 % Final    CBG: No results for input(s): GLUCAP in the last 168 hours.  Review of Systems:   Unobtainable 2/2 pt intubation  Past Medical History  She,  has a past medical history of Anxiety, Arthritis, Bronchitis, Bursitis, CAD (coronary artery disease), CHF (congestive heart failure) ([place]), Chronic cough, Chronic kidney disease, COPD (chronic obstructive pulmonary disease) ([place]), Depression, DM2 (diabetes mellitus, type 2) ([place]), Environmental allergies, GERD (gastroesophageal reflux disease), Hypercholesteremia, Hypertension, Left ventricular outflow tract obstruction, Lower extremity edema, LVH (left ventricular hypertrophy), Muscle weakness, Obesity, On supplemental oxygen therapy, OSA (obstructive sleep apnea), Osteoarthritis, Schizophrenia ([place]), Tremors of nervous system, and Wheezing.   Surgical History    Past Surgical History:  Procedure Laterality Date  . CATARACT EXTRACTION W/PHACO Left 09/10/2014   Procedure: CATARACT EXTRACTION PHACO AND INTRAOCULAR LENS PLACEMENT (IOC);  Surgeon: Birder Robson, MD;  Location: [facility] ORS;  Service: Ophthalmology;  Laterality: Left;  [place]: 00:44 AP%: 22.8 CDE: 10.24 Fluid lot #[id number]  . CATARACT EXTRACTION W/PHACO Right 10/15/2014   Procedure: CATARACT EXTRACTION PHACO AND INTRAOCULAR LENS PLACEMENT (IOC);  Surgeon: Birder Robson, MD;  Location: [facility] ORS;   Service: Ophthalmology;  Laterality: Right;  [place]: 00:52 AP:40.1 CDE:11.83 LOT PACK #[id number]  . CHOLECYSTECTOMY    . COLONOSCOPY  04/2011   [facility] per patient incomplete  . EYE SURGERY    . JOINT REPLACEMENT     TKR  . KNEE RECONSTRUCTION, MEDIAL PATELLAR FEMORAL LIGAMENT    . RENAL BIOPSY    . TONSILLECTOMY    . TONSILLECTOMY       Social History   reports that she has quit smoking. She has a 120.00 pack-year smoking history. She has never used smokeless tobacco. She reports that she does not drink alcohol or use drugs.   Family History   Her family history includes Heart attack in her mother. There is no history of Colon cancer or Liver disease.   Allergies Allergies  Allergen Reactions  . Haldol [Haloperidol Decanoate] Swelling and Other (See Comments)    Reaction:  Swelling of tongue and blurred vision    . Prednisone Other (See Comments)    Unknown reaction  . Raspberry Swelling and Other (See Comments)    Reaction:  Swelling of lips   . Metformin Diarrhea     Home Medications  Prior to Admission medications   Medication Sig Start Date End Date Taking? Authorizing Provider  acetaminophen (TYLENOL) 325 MG tablet Take 2 tablets (650 mg total) by mouth every 4 (four) hours as needed for mild pain (temp > 101.5). 10/20/15   Gladstone Lighter, MD  albuterol (PROVENTIL HFA;VENTOLIN HFA) 108 (90 Base) MCG/ACT inhaler Inhale 2 puffs into the lungs every 6 (six) hours as needed for wheezing or shortness of breath.    [provider]  aspirin EC 81 MG tablet Take 81 mg by mouth daily.    [provider]  atorvastatin (LIPITOR) 20 MG tablet Take 20 mg by mouth at bedtime.    [provider]  benztropine (COGENTIN) 0.5 MG tablet Take 0.5 mg by mouth 2 (two) times daily.    [provider]  carvedilol (COREG) 12.5 MG tablet Take 1 tablet (12.5 mg total) by mouth 2 (two) times daily. 11/08/17   Salary, [name], MD  cephALEXin (KEFLEX) 500 MG capsule  Take 1 capsule (500 mg total) by mouth 4 (four) times daily for 10 days.  07/24/18 08/03/18  Earleen Newport, MD  cetirizine (ZYRTEC) 10 MG tablet Take 10 mg by mouth daily.    [provider]  cholecalciferol (VITAMIN D) 1000 units tablet Take 1,000 Units by mouth daily.    [provider]  divalproex (DEPAKOTE) 500 MG DR tablet Take 1 tablet (500 mg total) by mouth 3 (three) times daily. 08/15/17   Loletha Grayer, MD  docusate sodium (COLACE) 100 MG capsule Take 100 mg by mouth 2 (two) times daily.     [provider]  ENSURE MAX PROTEIN (ENSURE MAX PROTEIN) LIQD Take 330 mLs (11 oz total) by mouth 2 (two) times daily. 04/04/18   Stark Jock Jude, MD  famotidine (PEPCID) 20 MG tablet Take 20 mg by mouth 2 (two) times daily.    [provider]  fluPHENAZine (PROLIXIN) 5 MG/ML solution Take 5 mg by mouth 2 (two) times daily.     [provider]  Fluticasone-Salmeterol (ADVAIR) 250-50 MCG/DOSE AEPB Inhale 1 puff into the lungs 2 (two) times daily.    [provider]  insulin aspart (NOVOLOG) 100 UNIT/ML injection Inject 4 Units into the skin 4 (four) times daily. 02/16/18   Mayo, Pete Pelt, MD  insulin glargine (LANTUS) 100 UNIT/ML injection Inject 11 Units into the skin at bedtime.     [provider]  levothyroxine (SYNTHROID, LEVOTHROID) 75 MCG tablet Take 1 tablet by mouth daily. 04/05/17   [provider]  losartan (COZAAR) 25 MG tablet Take 25 mg by mouth daily.    [provider]  Melatonin (SM MELATONIN) 3 MG TABS Take 3 mg by mouth at bedtime as needed (sleep).    [provider]  nitroGLYCERIN (NITROSTAT) 0.4 MG SL tablet Place 0.4 mg under the tongue every 5 (five) minutes as needed for chest pain.    [provider]  oxyCODONE (OXY IR/ROXICODONE) 5 MG immediate release tablet Take 1 tablet (5 mg total) by mouth every 6 (six) hours as needed for moderate pain or severe pain. 04/04/18   Ojie, Jude, MD   sitaGLIPtin (JANUVIA) 50 MG tablet Take 50 mg by mouth daily.    [provider]  umeclidinium bromide (INCRUSE ELLIPTA) 62.5 MCG/INH AEPB Inhale 1 puff into the lungs daily.    [provider]     Critical care time: The patient is critically ill with multiple organ systems failure and requires high complexity decision making for assessment and support, frequent evaluation and titration of therapies, application of advanced monitoring technologies and extensive interpretation of multiple databases.  Critical care time 49 mins. This represents my time independent of the NPs time taking care of the pt. This is excluding procedures.     Audria Nine DO Pager: [phone] After hours pager: [phone]  [facility] 08/08/2018, 2:26 PM

## 2018-08-03 ENCOUNTER — Inpatient Hospital Stay (HOSPITAL_COMMUNITY): Payer: Medicare HMO

## 2018-08-03 DIAGNOSIS — I5032 Chronic diastolic (congestive) heart failure: Secondary | ICD-10-CM

## 2018-08-03 DIAGNOSIS — J9621 Acute and chronic respiratory failure with hypoxia: Secondary | ICD-10-CM

## 2018-08-03 LAB — COMPREHENSIVE METABOLIC PANEL
ALT: 15 U/L (ref 0–44)
AST: 30 U/L (ref 15–41)
Albumin: 2.4 g/dL — ABNORMAL LOW (ref 3.5–5.0)
Alkaline Phosphatase: 41 U/L (ref 38–126)
Anion gap: 9 (ref 5–15)
BUN: 15 mg/dL (ref 8–23)
CO2: 26 mmol/L (ref 22–32)
Calcium: 7.5 mg/dL — ABNORMAL LOW (ref 8.9–10.3)
Chloride: 106 mmol/L (ref 98–111)
Creatinine, Ser: 0.78 mg/dL (ref 0.44–1.00)
GFR calc Af Amer: >60 mL/min (ref >60)
GFR calc non Af Amer: >60 mL/min (ref >60)
Glucose, Bld: 120 mg/dL — ABNORMAL HIGH (ref 70–99)
Potassium: 4.6 mmol/L (ref 3.5–5.1)
Sodium: 141 mmol/L (ref 135–145)
Total Bilirubin: 0.2 mg/dL — ABNORMAL LOW (ref 0.3–1.2)
Total Protein: 6.2 g/dL — ABNORMAL LOW (ref 6.5–8.1)

## 2018-08-03 LAB — BLOOD CULTURE ID PANEL (REFLEXED)

## 2018-08-03 LAB — CBC
HCT: 42.7 % (ref 36.0–46.0)
Hemoglobin: 12.6 g/dL (ref 12.0–15.0)
MCH: 28.4 pg (ref 26.0–34.0)
MCHC: 29.5 g/dL — ABNORMAL LOW (ref 30.0–36.0)
MCV: 96.4 fL (ref 80.0–100.0)
Platelets: 145 10*3/uL — ABNORMAL LOW (ref 150–400)
RBC: 4.43 MIL/uL (ref 3.87–5.11)
RDW: 14.6 % (ref 11.5–15.5)
WBC: 4.9 10*3/uL (ref 4.0–10.5)
nRBC: 0 % (ref 0.0–0.2)

## 2018-08-03 LAB — GLUCOSE, CAPILLARY
Glucose-Capillary: 102 mg/dL — ABNORMAL HIGH (ref 70–99)
Glucose-Capillary: 108 mg/dL — ABNORMAL HIGH (ref 70–99)
Glucose-Capillary: 129 mg/dL — ABNORMAL HIGH (ref 70–99)
Glucose-Capillary: 152 mg/dL — ABNORMAL HIGH (ref 70–99)
Glucose-Capillary: 263 mg/dL — ABNORMAL HIGH (ref 70–99)

## 2018-08-03 LAB — MAGNESIUM: Magnesium: 1.9 mg/dL (ref 1.7–2.4)

## 2018-08-03 LAB — URINE CULTURE
Culture: NO GROWTH
Special Requests: NORMAL

## 2018-08-03 LAB — LACTATE DEHYDROGENASE: LDH: 286 U/L — ABNORMAL HIGH (ref 98–192)

## 2018-08-03 LAB — C-REACTIVE PROTEIN: CRP: 4.1 mg/dL — ABNORMAL HIGH (ref <1.0)

## 2018-08-03 LAB — D-DIMER, QUANTITATIVE: D-Dimer, Quant: 3.31 ug/mL-FEU — ABNORMAL HIGH (ref 0.00–0.50)

## 2018-08-03 LAB — MRSA PCR SCREENING: MRSA by PCR: NEGATIVE

## 2018-08-03 LAB — PHOSPHORUS: Phosphorus: 3.3 mg/dL (ref 2.5–4.6)

## 2018-08-03 LAB — BRAIN NATRIURETIC PEPTIDE: B Natriuretic Peptide: 218.4 pg/mL — ABNORMAL HIGH (ref 0.0–100.0)

## 2018-08-03 LAB — PROCALCITONIN: Procalcitonin: <0.1 ng/mL

## 2018-08-03 LAB — FERRITIN: Ferritin: 165 ng/mL (ref 11–307)

## 2018-08-03 MED ORDER — LEVOTHYROXINE SODIUM 75 MCG PO TABS
75.0000 ug | ORAL_TABLET | Freq: Every day | ORAL | Status: DC
  Administered 2018-08-04 – 2018-08-06 (×3): 75 ug via ORAL
  Filled 2018-08-03 (×2): qty 1

## 2018-08-03 MED ORDER — MOMETASONE FURO-FORMOTEROL FUM 200-5 MCG/ACT IN AERO
2.0000 | INHALATION_SPRAY | Freq: Two times a day (BID) | RESPIRATORY_TRACT | Status: DC
  Administered 2018-08-03 – 2018-08-06 (×7): 2 via RESPIRATORY_TRACT
  Filled 2018-08-03: qty 8.8

## 2018-08-03 MED ORDER — ALBUTEROL SULFATE HFA 108 (90 BASE) MCG/ACT IN AERS
2.0000 | INHALATION_SPRAY | Freq: Four times a day (QID) | RESPIRATORY_TRACT | Status: DC
  Administered 2018-08-03 – 2018-08-04 (×5): 2 via RESPIRATORY_TRACT
  Filled 2018-08-03: qty 6.7

## 2018-08-03 MED ORDER — PANTOPRAZOLE SODIUM 40 MG PO TBEC
40.0000 mg | DELAYED_RELEASE_TABLET | Freq: Every day | ORAL | Status: DC
  Administered 2018-08-03 – 2018-08-06 (×4): 40 mg via ORAL
  Filled 2018-08-03 (×4): qty 1

## 2018-08-03 MED ORDER — BENZTROPINE MESYLATE 0.5 MG PO TABS
0.5000 mg | ORAL_TABLET | Freq: Two times a day (BID) | ORAL | Status: DC
  Administered 2018-08-03 – 2018-08-06 (×7): 0.5 mg via ORAL
  Filled 2018-08-03 (×10): qty 1

## 2018-08-03 MED ORDER — FLUPHENAZINE HCL 5 MG PO TABS
5.0000 mg | ORAL_TABLET | Freq: Every day | ORAL | Status: DC | PRN
  Administered 2018-08-05: 5 mg via ORAL
  Filled 2018-08-03 (×2): qty 1

## 2018-08-03 MED ORDER — ENOXAPARIN SODIUM 40 MG/0.4ML ~~LOC~~ SOLN
40.0000 mg | SUBCUTANEOUS | Status: DC
  Administered 2018-08-03 – 2018-08-06 (×4): 40 mg via SUBCUTANEOUS
  Filled 2018-08-03 (×3): qty 0.4

## 2018-08-03 MED ORDER — FLUPHENAZINE HCL 5 MG/ML PO CONC
5.0000 mg | Freq: Two times a day (BID) | ORAL | Status: DC
  Filled 2018-08-03: qty 1

## 2018-08-03 MED ORDER — FLUPHENAZINE HCL 5 MG PO TABS
5.0000 mg | ORAL_TABLET | Freq: Two times a day (BID) | ORAL | Status: DC
  Administered 2018-08-03 – 2018-08-06 (×7): 5 mg via ORAL
  Filled 2018-08-03 (×10): qty 1

## 2018-08-03 MED ORDER — FLUPHENAZINE HCL 5 MG/ML PO CONC
5.0000 mg | Freq: Every day | ORAL | Status: DC | PRN
  Filled 2018-08-03: qty 1

## 2018-08-03 MED ORDER — DIVALPROEX SODIUM 500 MG PO DR TAB
500.0000 mg | DELAYED_RELEASE_TABLET | Freq: Three times a day (TID) | ORAL | Status: DC
  Administered 2018-08-03 – 2018-08-06 (×10): 500 mg via ORAL
  Filled 2018-08-03 (×14): qty 1

## 2018-08-03 MED ORDER — UMECLIDINIUM BROMIDE 62.5 MCG/INH IN AEPB
1.0000 | INHALATION_SPRAY | Freq: Every day | RESPIRATORY_TRACT | Status: DC
  Administered 2018-08-03 – 2018-08-06 (×4): 1 via RESPIRATORY_TRACT
  Filled 2018-08-03: qty 7

## 2018-08-03 MED ORDER — INSULIN ASPART 100 UNIT/ML ~~LOC~~ SOLN
0.0000 [IU] | Freq: Three times a day (TID) | SUBCUTANEOUS | Status: DC
  Administered 2018-08-03: 2 [IU] via SUBCUTANEOUS
  Administered 2018-08-03: 5 [IU] via SUBCUTANEOUS
  Administered 2018-08-04: 9 [IU] via SUBCUTANEOUS
  Administered 2018-08-04: 3 [IU] via SUBCUTANEOUS
  Administered 2018-08-04: 2 [IU] via SUBCUTANEOUS
  Administered 2018-08-05: 5 [IU] via SUBCUTANEOUS
  Administered 2018-08-05: 7 [IU] via SUBCUTANEOUS
  Administered 2018-08-05: 3 [IU] via SUBCUTANEOUS
  Administered 2018-08-06: 5 [IU] via SUBCUTANEOUS
  Administered 2018-08-06: 3 [IU] via SUBCUTANEOUS
  Administered 2018-08-06: 5 [IU] via SUBCUTANEOUS

## 2018-08-03 MED ORDER — INSULIN GLARGINE 100 UNIT/ML ~~LOC~~ SOLN
11.0000 [IU] | Freq: Every day | SUBCUTANEOUS | Status: DC
  Administered 2018-08-03 – 2018-08-05 (×3): 11 [IU] via SUBCUTANEOUS
  Filled 2018-08-03 (×4): qty 0.11

## 2018-08-03 NOTE — Evaluation (Signed)
Physical Therapy Evaluation Patient Details Name: [name] [name] MRN: [id number] DOB: [birth year] Today's Date: 08/03/2018   History of Present Illness  73 y.o. female admitted on 08/16/2018 for fever.  Pt dx with acute on chronic hypercarbic respiratory failure and COVID 19 viral PNA.  Pt with significant PMH of schizophrenia, OA, obseity, LVH, LE edema, HTN, DM2, COPD, CKD, CHF, CAD, R TKA.    Clinical Impression  Pt was able to sit supported EOB to eat her lunch, and then stand with two person assist and RW to take side steps up to [facility].  Pt is weak, but VSS on 2 L O2 [place] during our session.  She had some difficulty breathing and eating I believe because of nasal congestion (not easy to breathe when she closes her mouth).  I spoke with her sons [name] and [name]) over the phone to let them know how their mom did and to try to get some info re: PLOF.  [name] reports that he is not sure of her most recent function because the SNF has been closed to visitors.  She was recently ambulatory with a RW and assist.   PT to follow acutely for deficits listed below.      Follow Up Recommendations SNF;Other (comment)(for rehab)- requests somewhere other than [facility] (measurements PT);Wheelchair cushion (measurements PT);Rolling walker with 5" wheels;Hospital bed    Recommendations for Other Services OT consult(for feeding assist/devices)     Precautions / Restrictions Precautions Precautions: Fall;Other (comment) Precaution Comments: monitor sats      Mobility  Bed Mobility Overal bed mobility: Needs Assistance Bed Mobility: Rolling;Supine to Sit;Sit to Supine Rolling: Max assist;+2 for physical assistance   Supine to sit: Max assist;HOB elevated Sit to supine: Max assist;+2 for physical assistance   General bed mobility comments: Two person max assist to roll, come to sitting EOB and come back to supine.   Transfers Overall transfer level: Needs  assistance Equipment used: Rolling walker (2 wheeled) Transfers: Sit to/from Stand Sit to Stand: +2 physical assistance;Mod assist         General transfer comment: Two person heavy mod assist to stand EOB with RW.  Assist needed to power up, block feet from sliding and bring trunk over COG (posterior lean throughout).    Ambulation/Gait Ambulation/Gait assistance: Mod assist;+2 physical assistance Gait Distance (Feet): 3 Feet Assistive device: Rolling walker (2 wheeled) Gait Pattern/deviations: Step-to pattern     General Gait Details: Pt was able to take some side steps up in bed with RW.    Stairs            Wheelchair Mobility    Modified Rankin (Stroke Patients Only)       Balance Overall balance assessment: Needs assistance Sitting-balance support: Feet supported;No upper extremity supported;Bilateral upper extremity supported Sitting balance-Leahy Scale: Poor Sitting balance - Comments: mod assist in sitting.  Was able to sit EOB for >30 mins while PT assisted in feeding her lunch.  VSS throughout with only dips to 88-89% on 2 L O2 [place] while trying to swallow and eat.  She has significant nasal congestion making breathing with her mouth closed difficult.   Postural control: Posterior lean;Right lateral lean Standing balance support: Bilateral upper extremity supported Standing balance-Leahy Scale: Poor Standing balance comment: Two person mod assist with support of RW in standing.  Pertinent Vitals/Pain Pain Assessment: Faces Faces Pain Scale: Hurts little more Pain Location: bil UE with end ROM Pain Descriptors / Indicators: Guarding;Grimacing Pain Intervention(s): Limited activity within patient's tolerance;Monitored during session;Repositioned    Home Living Family/patient expects to be discharged to:: Skilled nursing facility([facility])                      Prior Function Level of Independence: Needs  assistance   Gait / Transfers Assistance Needed: per son, he thinks she was ambulatory, but has not had eyes on her in months due to [place] restrictions at SNF.  ADL's / Homemaking Assistance Needed: pt was unable to feed herself when I evaluated her as bil UEs are very stiff.         Hand Dominance   Dominant Hand: Right    Extremity/Trunk Assessment   Upper Extremity Assessment Upper Extremity Assessment: Defer to OT evaluation    Lower Extremity Assessment Lower Extremity Assessment: Generalized weakness(3+/5 grossly assessed in bed and EOB)    Cervical / Trunk Assessment Cervical / Trunk Assessment: Kyphotic  Communication   Communication: No difficulties  Cognition Arousal/Alertness: Lethargic Behavior During Therapy: WFL for tasks assessed/performed Overall Cognitive Status: History of cognitive impairments - at baseline                                 General Comments: Pt with h/o cognitive impairments in and out of lucidity with me.  Used to work as an Corporate treasurer.       General Comments      Exercises     Assessment/Plan    PT Assessment Patient needs continued PT services  PT Problem List Decreased strength;Decreased range of motion;Decreased balance;Decreased activity tolerance;Decreased mobility;Decreased cognition;Decreased knowledge of use of DME;Decreased safety awareness;Decreased knowledge of precautions;Cardiopulmonary status limiting activity;Obesity;Pain       PT Treatment Interventions DME instruction;Gait training;Functional mobility training;Therapeutic activities;Therapeutic exercise;Balance training;Patient/family education;Cognitive remediation    PT Goals (Current goals can be found in the Care Plan section)  Acute Rehab PT Goals Patient Stated Goal: to get stronger PT Goal Formulation: With patient Time For Goal Achievement: 08/17/18 Potential to Achieve Goals: Good    Frequency Min 2X/week   Barriers to discharge         Co-evaluation               AM-PAC PT "6 Clicks" Mobility  Outcome Measure Help needed turning from your back to your side while in a flat bed without using bedrails?: Total Help needed moving from lying on your back to sitting on the side of a flat bed without using bedrails?: Total Help needed moving to and from a bed to a chair (including a wheelchair)?: A Lot Help needed standing up from a chair using your arms (e.g., wheelchair or bedside chair)?: A Lot Help needed to walk in hospital room?: Total Help needed climbing 3-5 steps with a railing? : Total 6 Click Score: 8    End of Session Equipment Utilized During Treatment: Oxygen(2 L O2 [place]) Activity Tolerance: Patient limited by fatigue Patient left: in bed;with call bell/phone within reach;with bed alarm set   PT Visit Diagnosis: Muscle weakness (generalized) (M62.81);Difficulty in walking, not elsewhere classified (R26.2)    Time: 8546-2703 PT Time Calculation (min) (ACUTE ONLY): 79 min   Charges:   PT Evaluation $PT Eval Moderate Complexity: 1 Mod PT Treatments $Therapeutic Activity: 53-67 mins  Barbarann Ehlers Estella Malatesta, PT, DPT  Acute Rehabilitation [phone] pager #[phone] office   08/03/2018, 2:57 PM

## 2018-08-03 NOTE — Progress Notes (Signed)
PHARMACY - PHYSICIAN COMMUNICATION CRITICAL VALUE ALERT - BLOOD CULTURE IDENTIFICATION (BCID)  [name] [name] is an 73 y.o. female who presented to [facility] on 08/13/2018 with a chief complaint of COVID 19  Assessment:  Pt presented with hypoxia after found to be COVID+ at [facility]. Vanc/cefepime was ordered for empiric coverage due to infiltrates. Lab called with BCID result of staph species in 1/4 bottles. Likely to be contaminant CNS. Will FYI Dr. Sloan Leiter. Her MRSA PCR is neg  Name of physician (or Provider) Contacted: Dr Sloan Leiter  Current antibiotics: Vanc/cefepime  Changes to prescribed antibiotics recommended:  Will ask to dc vanc  Results for orders placed or performed during the hospital encounter of 08/01/2018  Blood Culture ID Panel (Reflexed) (Collected: 08/18/2018 10:27 AM)  Result Value Ref Range   Enterococcus species NOT DETECTED NOT DETECTED   Listeria monocytogenes NOT DETECTED NOT DETECTED   Staphylococcus species DETECTED (A) NOT DETECTED   Staphylococcus aureus (BCID) NOT DETECTED NOT DETECTED   Methicillin resistance DETECTED (A) NOT DETECTED   Streptococcus species NOT DETECTED NOT DETECTED   Streptococcus agalactiae NOT DETECTED NOT DETECTED   Streptococcus pneumoniae NOT DETECTED NOT DETECTED   Streptococcus pyogenes NOT DETECTED NOT DETECTED   Acinetobacter baumannii NOT DETECTED NOT DETECTED   Enterobacteriaceae species NOT DETECTED NOT DETECTED   Enterobacter cloacae complex NOT DETECTED NOT DETECTED   Escherichia coli NOT DETECTED NOT DETECTED   Klebsiella oxytoca NOT DETECTED NOT DETECTED   Klebsiella pneumoniae NOT DETECTED NOT DETECTED   Proteus species NOT DETECTED NOT DETECTED   Serratia marcescens NOT DETECTED NOT DETECTED   Haemophilus influenzae NOT DETECTED NOT DETECTED   Neisseria meningitidis NOT DETECTED NOT DETECTED   Pseudomonas aeruginosa NOT DETECTED NOT DETECTED   Candida albicans NOT DETECTED NOT DETECTED   Candida glabrata NOT DETECTED NOT  DETECTED   Candida krusei NOT DETECTED NOT DETECTED   Candida parapsilosis NOT DETECTED NOT DETECTED   Candida tropicalis NOT DETECTED NOT DETECTED   Onnie Boer, PharmD, BCIDP, AAHIVP, CPP Infectious Disease Pharmacist 08/03/2018 10:38 AM

## 2018-08-03 NOTE — Progress Notes (Addendum)
PROGRESS NOTE                                                                                                                                                                                                             Patient Demographics:    [name] [name], is a 73 y.o. female, DOB - 1945/09/29, UTM:[id number]  Outpatient Primary MD for the patient is [facility] [place]    LOS - 1  Chief Complaint  Patient presents with  . Fever       Brief Narrative: Patient is a 73 y.o. female with history of COPD, depression, DM-2, chronic diastolic heart failure, schizophrenia, dementia-presented with confusion-was found to have acute on chronic hypercarbic respiratory failure and COVID-19 viral pneumonia.  Due to rapid improvement-patient was able to avoid intubation.  See below for further details.   Subjective:   Lying comfortably in bed-looks chronically ill/frail appearing.  Not in distress-answer some questions appropriately.   Assessment  & Plan :   Acute on chronic hypercapnic and hypoxemic respiratory failure: Hypercapnia has resolved with just supportive care with steroids and bronchodilators, hypoxemia is likely secondary to COVID-19 viral pneumonia.  Do not think patient has bacterial pneumonia-procalcitonin is not elevated-we will discontinue all antimicrobial therapy and monitor.  Minimize use of narcotics/sedatives as much as possible  COVID-19 viral pneumonia: Bilateral infiltrates consistent with viral pneumonia-given negative procalcitonin-do not think patient has bacterial pneumonia hence will discontinue all antimicrobial therapy.  Given very poor overall state of health-and the likelihood that patient's life expectancy is probably less than 6 months-not a candidate for Remdesivir as this is a scarce resource at this point.  Plan is to provide supportive care-IV Solu-Medrol and monitor closely.   COVID-19 Labs:  Recent Labs    08/03/18 0748  DDIMER 3.31*  FERRITIN 165  LDH 286*  CRP 4.1*    Lab Results  Component Value Date   SARSCOV2NAA DETECTED (A) 08/20/2018     COVID-19 Medications: 6/10>> Solu-Medrol  Chronic diastolic heart failure: Appears relatively well compensated-BNP levels within normal limits.  Will attempt to keep in negative balance and provide as needed Lasix.  Insulin-dependent DM-2: CBGs relatively stable-resume Lantus 11 units at bedtime and continue SSI.  Follow and adjust accordingly  Schizophrenia: Continue Depakote and fluphenazine.  Hypothyroidism: Continue levothyroxine  Recent UTI: Has completed a  course of antimicrobial therapy-repeat blood culture on 6/10--no need for any further antimicrobial therapy at this point  Dementia with risk for delirium: Supportive care at this point-resume all of her schizophrenic medications.  History of right renal mass-likely renal cell carcinoma: Supportive care-follow-up with her outpatient physicians.  Debility/deconditioning: Await evaluation by rehab services-but suspect this very frail at baseline-probably worsened due to acute illness with COVID-19 and hypercarbia.  Palliative care: DNR in place-multiple medical comorbidities-very frail appearing-clearly not a candidate for any further escalation in care.  Spoke with patient's son [name]) over the phone-since she has improved compared to yesterday we will continue with full supportive care-but if patient deteriorates-may need to transition to comfort measures.  ABG:    Component Value Date/Time   PHART 7.38 11/05/2017 0419   PCO2ART 73 (HH) 11/05/2017 0419   PO2ART 76 (L) 11/05/2017 0419   HCO3 29.7 (H) 08/11/2018 1510   TCO2 32 07/29/2018 1510   O2SAT 84.0 08/21/2018 1510    Condition - Extremely Guarded  Family Communication  :  Son updated over the phone  Code Status :  DNR  Diet :  Diet Order            Diet heart  healthy/carb modified Room service appropriate? Yes; Fluid consistency: Thin  Diet effective now               Disposition Plan  :  Remain inpatient  Consults  :  PCCM  Procedures  :  None  DVT Prophylaxis  :  Lovenox  Lab Results  Component Value Date   PLT 145 (L) 08/03/2018    Inpatient Medications  Scheduled Meds: . aspirin  81 mg Per Tube Daily  . atorvastatin  20 mg Per Tube q1800  . benztropine  0.5 mg Oral BID  . fentaNYL (SUBLIMAZE) injection  25 mcg Intravenous Once  . fluPHENAZine  5 mg Oral BID  . heparin  5,000 Units Subcutaneous Q8H  . insulin aspart  0-9 Units Subcutaneous TID WC  . levothyroxine  37.5 mcg Intravenous Daily  . methylPREDNISolone (SOLU-MEDROL) injection  40 mg Intravenous Q12H  . pantoprazole (PROTONIX) IV  40 mg Intravenous QHS  . umeclidinium bromide  1 puff Inhalation Daily   Continuous Infusions: . sodium chloride 10 mL/hr at 08/19/2018 2323  . valproate sodium 500 mg (08/03/18 0832)   PRN Meds:.fentaNYL, fluPHENAZine, midazolam, midazolam  Antibiotics  :    Anti-infectives (From admission, onward)   Start     Dose/Rate Route Frequency Ordered Stop   08/03/18 1700  vancomycin (VANCOCIN) 1,250 mg in sodium chloride 0.9 % 250 mL IVPB  Status:  Discontinued     1,250 mg 166.7 mL/hr over 90 Minutes Intravenous Every 24 hours 08/08/2018 1609 08/03/18 1110   07/24/2018 1530  vancomycin (VANCOCIN) 2,000 mg in sodium chloride 0.9 % 500 mL IVPB     2,000 mg 250 mL/hr over 120 Minutes Intravenous  Once 08/20/2018 1523 08/01/2018 1912   08/04/2018 1515  ceFEPIme (MAXIPIME) 2 g in sodium chloride 0.9 % 100 mL IVPB  Status:  Discontinued     2 g 200 mL/hr over 30 Minutes Intravenous Every 8 hours 07/31/2018 1513 08/03/18 1110   07/30/2018 1245  cefTRIAXone (ROCEPHIN) 1 g in sodium chloride 0.9 % 100 mL IVPB     1 g 200 mL/hr over 30 Minutes Intravenous  Once 08/08/2018 1236 08/10/2018 1420   08/08/2018 1245  azithromycin (ZITHROMAX) 500 mg in sodium chloride  0.9 % 250 mL  IVPB     500 mg 250 mL/hr over 60 Minutes Intravenous  Once 07/30/2018 1236 08/12/2018 1809       Time Spent in minutes  35    Oren Binet M.D on 08/03/2018 at 11:27 AM  To page go to [email or web address] - use universal password  [facility] -  Office  [phone]  See all Orders from today for further details   Admit date - 08/03/2018    1    Objective:   Vitals:   08/03/18 0500 08/03/18 0615 08/03/18 0736 08/03/18 1116  BP:  (!) 152/85    Pulse: 66 69    Resp: (!) 22 (!) 34    Temp:  97.6 F (36.4 C) 98.4 F (36.9 C) 97.8 F (36.6 C)  TempSrc:  Oral Oral Oral  SpO2: 94% 92%    Weight: 120 kg     Height:        Wt Readings from Last 3 Encounters:  08/03/18 120 kg  07/24/18 120 kg  04/03/18 120.4 kg     Intake/Output Summary (Last 24 hours) at 08/03/2018 1127 Last data filed at 08/12/2018 2300 Gross per 24 hour  Intake 634.9 ml  Output 0 ml  Net 634.9 ml     Physical Exam Gen Exam: Chronically sick appearing-awake-not in any distress HEENT:atraumatic, normocephalic Chest: Has some scattered rales all over. CVS:S1S2 regular Abdomen:soft non tender, non distended Extremities:no edema Neurology: Appears to be very weak-significant amount of generalized weakness but seems to be moving all 4 extremities Skin: no rash   Data Review:    CBC Recent Labs  Lab 07/27/2018 1055 08/12/2018 1309 08/08/2018 1510 08/03/18 0748  WBC 6.5  --   --  4.9  HGB 13.4 13.9 13.3 12.6  HCT 43.0 41.0 39.0 42.7  PLT 155  --   --  145*  MCV 94.9  --   --  96.4  MCH 29.6  --   --  28.4  MCHC 31.2  --   --  29.5*  RDW 14.6  --   --  14.6  LYMPHSABS 1.2  --   --   --   MONOABS 1.0  --   --   --   EOSABS 0.0  --   --   --   BASOSABS 0.0  --   --   --     Chemistries  Recent Labs  Lab 08/19/2018 1055 08/15/2018 1309 08/04/2018 1510 08/03/18 0748  NA 139 141 142 141  K 4.1 4.1 4.0 4.6  CL 103  --   --  106  CO2 26  --   --  26  GLUCOSE 105*  --   --   120*  BUN 13  --   --  15  CREATININE 1.07*  --   --  0.78  CALCIUM 8.1*  --   --  7.5*  MG  --   --   --  1.9  AST 35  --   --  30  ALT 13  --   --  15  ALKPHOS 42  --   --  41  BILITOT 0.5  --   --  0.2*   ------------------------------------------------------------------------------------------------------------------ No results for input(s): CHOL, HDL, LDLCALC, TRIG, CHOLHDL, LDLDIRECT in the last 72 hours.  Lab Results  Component Value Date   HGBA1C 7.6 (H) 10/13/2015   ------------------------------------------------------------------------------------------------------------------ Recent Labs    08/01/2018 1055  TSH 6.497*   ------------------------------------------------------------------------------------------------------------------ Recent Labs    08/03/18  7096  FERRITIN 165    Coagulation profile No results for input(s): INR, PROTIME in the last 168 hours.  Recent Labs    08/03/18 0748  DDIMER 3.31*    Cardiac Enzymes No results for input(s): CKMB, TROPONINI, MYOGLOBIN in the last 168 hours.  Invalid input(s): CK ------------------------------------------------------------------------------------------------------------------    Component Value Date/Time   BNP 218.4 (H) 08/03/2018 2836    Micro Results Recent Results (from the past 240 hour(s))  Urine Culture     Status: Abnormal   Collection Time: 07/24/18  2:00 PM   Specimen: Urine, Catheterized  Result Value Ref Range Status   Specimen Description   Final    URINE, CATHETERIZED Performed at [facility], [address] [place], [place] [address]    Special Requests   Final    NONE Performed at [facility], [place], [place] [address]    Culture >=100,000 COLONIES/mL ESCHERICHIA COLI (A)  Final   Report Status 07/30/2018 FINAL  Final   Organism ID, Bacteria ESCHERICHIA COLI (A)  Final      Susceptibility   Escherichia coli - MIC*    AMPICILLIN >=32  RESISTANT Resistant     CEFAZOLIN <=4 SENSITIVE Sensitive     CEFTRIAXONE <=1 SENSITIVE Sensitive     CIPROFLOXACIN <=0.25 SENSITIVE Sensitive     GENTAMICIN <=1 SENSITIVE Sensitive     IMIPENEM <=0.25 SENSITIVE Sensitive     NITROFURANTOIN <=16 SENSITIVE Sensitive     TRIMETH/SULFA >=320 RESISTANT Resistant     AMPICILLIN/SULBACTAM 16 INTERMEDIATE Intermediate     PIP/TAZO <=4 SENSITIVE Sensitive     Extended ESBL NEGATIVE Sensitive     * >=100,000 COLONIES/mL ESCHERICHIA COLI  SARS Coronavirus 2     Status: Abnormal   Collection Time: 08/04/2018 10:00 AM  Result Value Ref Range Status   SARS Coronavirus 2 DETECTED (A) NOT DETECTED Final    Comment: RESULT CALLED TO, READ BACK BY AND VERIFIED WITH: E. Pulliam RN 12:30 08/18/2018 ([email or web address]) (NOTE) SARS-CoV-2 target nucleic acids are DETECTED. The SARS-CoV-2 RNA is generally detectable in upper and lower respiratory specimens during the acute phase of infection. Positive results are indicative of active infection with SARS-CoV-2. Clinical  correlation with patient history and other diagnostic information is necessary to determine patient infection status. Positive results do  not rule out bacterial infection or co-infection with other viruses. The expected result is Not Detected. Fact Sheet for Patients: http://www.biofiredefense.com/wp-content/uploads/2020/03/BIOFIRE-COVID -19-patients.pdf Fact Sheet for Healthcare Providers: http://www.biofiredefense.com/wp-content/uploads/2020/03/BIOFIRE-COVID -19-hcp.pdf This test is not yet approved or cleared by the [place] and  has been authorized for detection and/or diagnosis of SARS-CoV-2 by FDA under an Emergency  Use Authorization (EUA).  This EUA will remain in effect (meaning this test can be used) for the duration of  the COVID-19 declaration under Section 564(b)(1) of the Act, 21 U.S.C. section [phone] 3(b)(1), unless the authorization is terminated or revoked sooner.  Performed at [facility], [place] [address], [place], [place] [address]   Blood Culture (routine x 2)     Status: None (Preliminary result)   Collection Time: 07/24/2018 10:22 AM   Specimen: BLOOD RIGHT HAND  Result Value Ref Range Status   Specimen Description BLOOD RIGHT HAND  Final   Special Requests   Final    BOTTLES DRAWN AEROBIC AND ANAEROBIC Blood Culture adequate volume   Culture   Final    NO GROWTH < 24 HOURS Performed at [facility], [place]  [address] [place], [place] [address]    Report Status PENDING  Incomplete  Urine culture     Status: None   Collection Time: 07/30/2018 10:22 AM   Specimen: Urine, Catheterized  Result Value Ref Range Status   Specimen Description URINE, CATHETERIZED  Final   Special Requests Normal  Final   Culture   Final    NO GROWTH Performed at [facility], [place] [address], [place], [place] [address]    Report Status 08/03/2018 FINAL  Final  Blood Culture (routine x 2)     Status: None (Preliminary result)   Collection Time: 08/22/2018 10:27 AM   Specimen: BLOOD LEFT HAND  Result Value Ref Range Status   Specimen Description BLOOD LEFT HAND  Final   Special Requests   Final    BOTTLES DRAWN AEROBIC AND ANAEROBIC Blood Culture adequate volume   Culture  Setup Time   Final    GRAM POSITIVE COCCI AEROBIC BOTTLE ONLY Organism ID to follow CRITICAL RESULT CALLED TO, READ BACK BY AND VERIFIED WITH: Jene Every PharmD 10:25 08/03/18 ([email or web address])    Culture   Final    NO GROWTH < 24 HOURS Performed at [facility], [place] [address], [place], [place] [address]    Report Status PENDING  Incomplete  Blood Culture ID Panel (Reflexed)     Status: Abnormal   Collection Time: 08/14/2018 10:27 AM  Result Value Ref Range Status   Enterococcus species NOT DETECTED NOT DETECTED Final   Listeria monocytogenes NOT DETECTED NOT DETECTED Final   Staphylococcus species DETECTED (A) NOT DETECTED Final    Comment: Methicillin (oxacillin) resistant  coagulase negative staphylococcus. Possible blood culture contaminant (unless isolated from more than one blood culture draw or clinical case suggests pathogenicity). No antibiotic treatment is indicated for blood  culture contaminants. CRITICAL RESULT CALLED TO, READ BACK BY AND VERIFIED WITH: Jene Every PharmD 10:25 08/03/18 ([email or web address])    Staphylococcus aureus (BCID) NOT DETECTED NOT DETECTED Final   Methicillin resistance DETECTED (A) NOT DETECTED Final    Comment: CRITICAL RESULT CALLED TO, READ BACK BY AND VERIFIED WITH: Jene Every PharmD 10:25 08/03/18 (wilsonm)    Streptococcus species NOT DETECTED NOT DETECTED Final   Streptococcus agalactiae NOT DETECTED NOT DETECTED Final   Streptococcus pneumoniae NOT DETECTED NOT DETECTED Final   Streptococcus pyogenes NOT DETECTED NOT DETECTED Final   Acinetobacter baumannii NOT DETECTED NOT DETECTED Final   Enterobacteriaceae species NOT DETECTED NOT DETECTED Final   Enterobacter cloacae complex NOT DETECTED NOT DETECTED Final   Escherichia coli NOT DETECTED NOT DETECTED Final   Klebsiella oxytoca NOT DETECTED NOT DETECTED Final   Klebsiella pneumoniae NOT DETECTED NOT DETECTED Final   Proteus species NOT DETECTED NOT DETECTED Final   Serratia marcescens NOT DETECTED NOT DETECTED Final   Haemophilus influenzae NOT DETECTED NOT DETECTED Final   Neisseria meningitidis NOT DETECTED NOT DETECTED Final   Pseudomonas aeruginosa NOT DETECTED NOT DETECTED Final   Candida albicans NOT DETECTED NOT DETECTED Final   Candida glabrata NOT DETECTED NOT DETECTED Final   Candida krusei NOT DETECTED NOT DETECTED Final   Candida parapsilosis NOT DETECTED NOT DETECTED Final   Candida tropicalis NOT DETECTED NOT DETECTED Final    Comment: Performed at [facility], [place]. [address], [place], [place] [address]  MRSA PCR Screening     Status: None   Collection Time: 08/01/2018 11:35 PM   Specimen: Nasopharyngeal  Result Value Ref Range Status   MRSA by PCR  NEGATIVE NEGATIVE Final    Comment:        The GeneXpert MRSA Assay (FDA approved for NASAL specimens only), is one component of a comprehensive MRSA colonization surveillance program. It is not intended to diagnose MRSA infection nor to guide or monitor treatment for MRSA infections. Performed at [facility], [place] [address] [place], [place] [address]     Radiology Reports Dg Chest Port 1 View  Result Date: 08/03/2018 CLINICAL DATA:  Encounter for respiratory failure. EXAM: PORTABLE CHEST 1 VIEW COMPARISON:  One-view chest x-ray 07/25/2018 FINDINGS: Heart is enlarged. Lung volumes are low. Progressive bilateral airspace disease is noted, left greater than right. Mild pulmonary vascular congestion is again seen. There is some fluid along the right minor fissure and at the right base. IMPRESSION: 1. Progressive bilateral airspace disease, most prominent at the lung bases, left greater than right. 2. Right pleural effusion. Electronically Signed   By: San Morelle M.D.   On: 08/03/2018 06:05   Dg Chest Port 1 View  Result Date: 08/03/2018 CLINICAL DATA:  Altered mental status.  Hypoxia. EXAM: PORTABLE CHEST 1 VIEW COMPARISON:  One-view chest x-ray 03/26/2018 FINDINGS: The heart is enlarged. Lung volumes are low. Patchy bilateral airspace opacities are present. No significant focal consolidation is present. There is no edema or effusion. Visualized soft tissues and bony thorax are unremarkable. IMPRESSION: 1. Stable cardiomegaly without failure. 2. Patchy bilateral airspace disease. This is concerning for atypical infection. Edema is considered less likely. 3. Decreased lung volumes. Electronically Signed   By: San Morelle M.D.   On: 08/03/2018 11:53

## 2018-08-03 NOTE — TOC Initial Note (Signed)
Transition of Care [facility]) - Initial/Assessment Note    Patient Details  Name: [name] [name] MRN: [id number] Date of Birth: November 12, 1945  Transition of Care [facility]) CM/SW Contact:    Alberteen Sam, [place] Phone Number: [phone] 08/03/2018, 4:04 PM  Clinical Narrative:                  CSW consulted with patient's son [name] regarding discharge planning. [name] reports patient has been at [facility] for 6 months now and he would like to look at other SNF options. CSW advised that SNF options during this pandemic for a COVID + patient are extremely limited. CSW has agreed to send referrals to [facility] and [place]. These are the only 2 facilities that accept COVID + patients except for [facility] and [place] which son reports is too far. CSW informed if neither have beds available patient will return to [facility] until testing COVID - and then she will have more SNF options to transfer to. [name] continues to report patient can not go back to [facility] and he cannot take her home. CSW will follow up with referrals with a goal of potential bed offers from [place] or [place]. Patient is long term SNF care with schizophrenia which also pose potential barriers to bed offers.   Expected Discharge Plan: Skilled Nursing Facility Barriers to Discharge: Continued Medical Work up   Patient Goals and CMS Choice   CMS Medicare.gov Compare Post Acute Care list provided to:: Patient Represenative (must comment)(Kelly (son)) Choice offered to / list presented to : Adult Children(Kelly (son))  Expected Discharge Plan and Services Expected Discharge Plan: [place] Acute Care Choice: [place] arrangements for the past 2 months: Skilled Nursing Facility([facility])                                      Prior Living Arrangements/Services Living arrangements for the past 2 months: Skilled Nursing Facility([facility]) Lives with::  Self Patient language and need for interpreter reviewed:: Yes        Need for Family Participation in Patient Care: Yes (Comment) Care giver support system in place?: Yes (comment)   Criminal Activity/Legal Involvement Pertinent to Current Situation/Hospitalization: No - Comment as needed  Activities of Daily Living Home Assistive Devices/Equipment: None ADL Screening (condition at time of admission) Patient's cognitive ability adequate to safely complete daily activities?: Yes Is the patient deaf or have difficulty hearing?: No Does the patient have difficulty seeing, even when wearing glasses/contacts?: No Does the patient have difficulty concentrating, remembering, or making decisions?: No Patient able to express need for assistance with ADLs?: Yes Does the patient have difficulty dressing or bathing?: Yes Independently performs ADLs?: No Communication: Independent Dressing (OT): Needs assistance Does the patient have difficulty walking or climbing stairs?: Yes Weakness of Legs: Both Weakness of Arms/Hands: Both  Permission Sought/Granted Permission sought to share information with : Case Manager, Customer service manager, Family Supports Permission granted to share information with : Yes, Verbal Permission Granted  Share Information with NAME: [name]  Permission granted to share info w AGENCY: SNF  Permission granted to share info w Relationship: son  Permission granted to share info w Contact Information: [phone]  Emotional Assessment Appearance:: Other (Comment Required(unable to assess - remote) Attitude/Demeanor/Rapport: Unable to Assess Affect (typically observed): Unable to Assess Orientation: : Oriented to  Self Alcohol / Substance Use: Not Applicable Psych Involvement: No (comment)  Admission diagnosis:  Acute respiratory failure with hypercapnia ([place]) [J96.02] Community acquired pneumonia, unspecified laterality [J18.9] COVID-19 [U07.1, J98.8] Patient  Active Problem List   Diagnosis Date Noted  . Acute hypercapnic respiratory failure ([place]) 07/25/2018  . Dementia without behavioral disturbance ([place])   . Dehydration   . Decubitus ulcer of sacral region, stage 1   . Goals of care, counseling/discussion   . Palliative care by specialist   . Elevated troponin 03/27/2018  . AMS (altered mental status) 02/14/2018  . Pressure injury of skin 11/08/2017  . Acute and chronic respiratory failure with hypercapnia ([place]) 11/05/2017  . Acute renal failure (ARF) ([place]) 08/05/2017  . ARF (acute renal failure) ([place]) 08/03/2017  . UTI (urinary tract infection) 01/16/2016  . Altered mental status 01/16/2016  . Metabolic encephalopathy   . Acute on chronic respiratory failure with hypoxia and hypercapnia ([place]) 10/13/2015  . Involuntary commitment 09/02/2015  . Schizoaffective disorder, bipolar type ([place])   . Urinary incontinence 10/30/2014  . GERD (gastroesophageal reflux disease) 10/30/2014  . OSA (obstructive sleep apnea) 10/30/2014  . Osteoarthrosis, unspecified whether generalized or localized, involving lower leg 10/30/2014  . COPD (chronic obstructive pulmonary disease) ([place]) 10/30/2014  . Chronic diastolic CHF (congestive heart failure) ([place]) 05/15/2014  . Morbid obesity ([place])   . History of colon polyps 03/09/2013  . Renal mass 06/26/2011  . Lytic bone lesion of hip 06/25/2011  . Hypertension 06/24/2011  . Diabetes mellitus ([place]) 06/24/2011   PCP:  [facility] [place]:  No Pharmacies Listed    Social Determinants of Health (SDOH) Interventions    Readmission Risk Interventions No flowsheet data found.

## 2018-08-03 NOTE — Progress Notes (Signed)
Report given to RN Fransisco Beau in PCU. She has no further questions at this time. Transferring pt via bed now.

## 2018-08-03 NOTE — Evaluation (Signed)
Clinical/Bedside Swallow Evaluation Patient Details  Name: [name] [name] MRN: [id number] Date of Birth: [birth year]  Today's Date: 08/03/2018 Time: SLP Start Time (ACUTE ONLY): 5176 SLP Stop Time (ACUTE ONLY): 1349 SLP Time Calculation (min) (ACUTE ONLY): 12 min  Past Medical History:  Past Medical History:  Diagnosis Date  . Anxiety   . Arthritis   . Bronchitis   . Bursitis   . CAD (coronary artery disease)   . CHF (congestive heart failure) ([place])   . Chronic cough   . Chronic kidney disease    shadow on x-ray  . COPD (chronic obstructive pulmonary disease) ([place])   . Depression   . DM2 (diabetes mellitus, type 2) ([place])   . Environmental allergies   . GERD (gastroesophageal reflux disease)   . Hypercholesteremia   . Hypertension   . Left ventricular outflow tract obstruction    a. echo 03/2014: EF 60-65%, hypernamic LV systolic fxn, mod LVH w/ LVOT gradient estimated at 68 mm Hg w/ valsalva, very small LV internal cavity size, mildly increased LV posterior wall thickness, mild Ao valve scl w/o stenosis, diastolic dysfunction, normal RVSP  . Lower extremity edema   . LVH (left ventricular hypertrophy)    a. echo suggests long standing uncontrolled htn. she will not do well when dehydrated, LV cavity obliteration  . Muscle weakness   . Obesity   . On supplemental oxygen therapy    AS NEEDED  . OSA (obstructive sleep apnea)    does not use machine  . Osteoarthritis   . Schizophrenia ([place])   . Tremors of nervous system   . Wheezing    Past Surgical History:  Past Surgical History:  Procedure Laterality Date  . CATARACT EXTRACTION W/PHACO Left 09/10/2014   Procedure: CATARACT EXTRACTION PHACO AND INTRAOCULAR LENS PLACEMENT (IOC);  Surgeon: Birder Robson, MD;  Location: [facility] ORS;  Service: Ophthalmology;  Laterality: Left;  [place]: 00:44 AP%: 22.8 CDE: 10.24 Fluid lot #[id number]  . CATARACT EXTRACTION W/PHACO Right 10/15/2014   Procedure: CATARACT EXTRACTION PHACO AND  INTRAOCULAR LENS PLACEMENT (IOC);  Surgeon: Birder Robson, MD;  Location: [facility] ORS;  Service: Ophthalmology;  Laterality: Right;  [place]: 00:52 AP:40.1 CDE:11.83 LOT PACK #[id number]  . CHOLECYSTECTOMY    . COLONOSCOPY  04/2011   [facility] per patient incomplete  . EYE SURGERY    . JOINT REPLACEMENT     TKR  . KNEE RECONSTRUCTION, MEDIAL PATELLAR FEMORAL LIGAMENT    . RENAL BIOPSY    . TONSILLECTOMY    . TONSILLECTOMY     HPI:  73 y.o. female, resident of [place], with history of COPD, depression, DM-2, chronic diastolic heart failure, schizophrenia, dementia-presented with confusion-was found to have acute on chronic hypercarbic respiratory failure and COVID-19 viral pneumonia.  Due to rapid improvement-patient was able to avoid intubation.    Assessment / Plan / Recommendation Clinical Impression  Pt presents with quite functional oropharyngeal swallow with adequate mastication, brisk swallow response, no s/s of aspiration despite large, successive boluses and consumption of large pill with water.  Excellent coordination of respiratory/swallow sequence. No s/s of dysphagia.  Continue current diet of regular solids, thin liquids. No SLP f/u is needed - our service will sign off.  SLP Visit Diagnosis: Dysphagia, unspecified (R13.10)    Aspiration Risk  No limitations    Diet Recommendation   regular/thins  Medication Administration: Whole meds with liquid    Other  Recommendations Oral Care Recommendations: Oral care BID   Follow up Recommendations None  Frequency and Duration            Prognosis        Swallow Study   General HPI: 73 y.o. female, resident of [place], with history of COPD, depression, DM-2, chronic diastolic heart failure, schizophrenia, dementia-presented with confusion-was found to have acute on chronic hypercarbic respiratory failure and COVID-19 viral pneumonia.  Due to rapid improvement-patient was able to avoid intubation.  Type of Study:  Bedside Swallow Evaluation Previous Swallow Assessment: OP MBS at [facility] 06/2017 with no aspiration Diet Prior to this Study: Regular;Thin liquids Temperature Spikes Noted: No Respiratory Status: Nasal cannula History of Recent Intubation: No Behavior/Cognition: Alert;Cooperative;Pleasant mood Oral Cavity Assessment: Within Functional Limits Oral Care Completed by SLP: No Oral Cavity - Dentition: Adequate natural dentition Vision: Functional for self-feeding Patient Positioning: Upright in bed Baseline Vocal Quality: Normal Volitional Cough: Strong Volitional Swallow: Able to elicit    Oral/Motor/Sensory Function Overall Oral Motor/Sensory Function: Within functional limits   Ice Chips Ice chips: Within functional limits   Thin Liquid Thin Liquid: Within functional limits    Nectar Thick Nectar Thick Liquid: Not tested   Honey Thick Honey Thick Liquid: Not tested   Puree Puree: Within functional limits   Solid     Solid: Not tested      Juan Quam Laurice 08/03/2018,3:14 PM  Estill Bamberg L. Tivis Ringer, [place] Office number [phone]

## 2018-08-03 NOTE — NC FL2 (Signed)
[place] LEVEL OF CARE SCREENING TOOL     IDENTIFICATION  Patient Name: [name] [name] Birthdate: [birth year] Sex: female Admission Date (Current Location): 08/18/2018  [facility] and [place] Number:  Herbalist and Address:  [facility] [place], [place] [address], [place], [place])      Provider Number: [id number]  Attending Physician Name and Address:  Jonetta Osgood, MD  Relative Name and Phone Number:  [name] [phone]    Current Level of Care: Hospital Recommended Level of Care: [place] Prior Approval Number:    Date Approved/Denied: 02/16/16 PASRR Number: [id number]  Discharge Plan: SNF    Current Diagnoses: Patient Active Problem List   Diagnosis Date Noted  . Acute hypercapnic respiratory failure ([place]) 08/22/2018  . Dementia without behavioral disturbance ([place])   . Dehydration   . Decubitus ulcer of sacral region, stage 1   . Goals of care, counseling/discussion   . Palliative care by specialist   . Elevated troponin 03/27/2018  . AMS (altered mental status) 02/14/2018  . Pressure injury of skin 11/08/2017  . Acute and chronic respiratory failure with hypercapnia ([place]) 11/05/2017  . Acute renal failure (ARF) ([place]) 08/05/2017  . ARF (acute renal failure) ([place]) 08/03/2017  . UTI (urinary tract infection) 01/16/2016  . Altered mental status 01/16/2016  . Metabolic encephalopathy   . Acute on chronic respiratory failure with hypoxia and hypercapnia ([place]) 10/13/2015  . Involuntary commitment 09/02/2015  . Schizoaffective disorder, bipolar type ([place])   . Urinary incontinence 10/30/2014  . GERD (gastroesophageal reflux disease) 10/30/2014  . OSA (obstructive sleep apnea) 10/30/2014  . Osteoarthrosis, unspecified whether generalized or localized, involving lower leg 10/30/2014  . COPD (chronic obstructive pulmonary disease) ([place]) 10/30/2014  . Chronic diastolic CHF (congestive  heart failure) ([place]) 05/15/2014  . Morbid obesity ([place])   . History of colon polyps 03/09/2013  . Renal mass 06/26/2011  . Lytic bone lesion of hip 06/25/2011  . Hypertension 06/24/2011  . Diabetes mellitus ([place]) 06/24/2011    Orientation RESPIRATION BLADDER Height & Weight     Self  O2(nasal cannula 2L/min) Incontinent, External catheter Weight: 264 lb 8.8 oz (120 kg) Height:  5\' 6"  (167.6 cm)  BEHAVIORAL SYMPTOMS/MOOD NEUROLOGICAL BOWEL NUTRITION STATUS      Continent Diet(see discharge summary)  AMBULATORY STATUS COMMUNICATION OF NEEDS Skin   Extensive Assist Verbally Other (Comment)(abrasion left knee, MASD breast, groin, sacrum, skin tear on sacrum)                       Personal Care Assistance Level of Assistance  Bathing, Feeding, Dressing, Total care Bathing Assistance: Limited assistance Feeding assistance: Independent Dressing Assistance: Limited assistance Total Care Assistance: Maximum assistance   Functional Limitations Info  Sight, Hearing, Speech Sight Info: Adequate Hearing Info: Adequate Speech Info: Adequate    SPECIAL CARE FACTORS FREQUENCY  PT (By licensed PT), OT (By licensed OT)     PT Frequency: min 5x weekly OT Frequency: min 5x weekly            Contractures Contractures Info: Not present    Additional Factors Info  Code Status, Allergies, Isolation Precautions Code Status Info: DNR Allergies Info: Haldol (haloperidol decanoate), Prednisone, Raspberry, Metformin     Isolation Precautions Info: Droplet and contact precautions, emergin pathogen     Current Medications (08/03/2018):  This is the current hospital active medication list Current Facility-Administered Medications  Medication Dose Route Frequency Provider  Last Rate Last Dose  . 0.9 %  sodium chloride infusion   Intravenous Continuous Annita Brod, MD 10 mL/hr at 08/06/2018 2323    . albuterol (VENTOLIN HFA) 108 (90 Base) MCG/ACT inhaler 2 puff  2 puff Inhalation Q6H  Ghimire, [name], MD   2 puff at 08/03/18 1343  . aspirin chewable tablet 81 mg  81 mg Per Tube Daily Annita Brod, MD   81 mg at 08/03/18 1043  . atorvastatin (LIPITOR) tablet 20 mg  20 mg Per Tube q1800 Annita Brod, MD   20 mg at 07/24/2018 2152  . benztropine (COGENTIN) tablet 0.5 mg  0.5 mg Oral BID Jonetta Osgood, MD      . divalproex (DEPAKOTE) DR tablet 500 mg  500 mg Oral TID Jonetta Osgood, MD   500 mg at 08/03/18 1342  . enoxaparin (LOVENOX) injection 40 mg  40 mg Subcutaneous Q24H Jonetta Osgood, MD   40 mg at 08/03/18 1342  . fentaNYL (SUBLIMAZE) bolus via infusion 25 mcg  25 mcg Intravenous Q15 min PRN Annita Brod, MD      . fentaNYL (SUBLIMAZE) injection 25 mcg  25 mcg Intravenous Once Annita Brod, MD      . fluPHENAZine (PROLIXIN) tablet 5 mg  5 mg Oral BID Ghimire, [name], MD      . fluPHENAZine (PROLIXIN) tablet 5 mg  5 mg Oral Daily PRN Jonetta Osgood, MD      . insulin aspart (novoLOG) injection 0-9 Units  0-9 Units Subcutaneous TID WC Jonetta Osgood, MD   2 Units at 08/03/18 1136  . insulin glargine (LANTUS) injection 11 Units  11 Units Subcutaneous QHS Ghimire, [name], MD      . levothyroxine (SYNTHROID) tablet 75 mcg  75 mcg Oral Daily Ghimire, Shanker M, MD      . methylPREDNISolone sodium succinate (SOLU-MEDROL) 40 mg/mL injection 40 mg  40 mg Intravenous Q12H Annita Brod, MD   40 mg at 08/03/18 1043  . mometasone-formoterol (DULERA) 200-5 MCG/ACT inhaler 2 puff  2 puff Inhalation BID Jonetta Osgood, MD   2 puff at 08/03/18 1346  . pantoprazole (PROTONIX) EC tablet 40 mg  40 mg Oral Daily Jonetta Osgood, MD   40 mg at 08/03/18 1343  . umeclidinium bromide (INCRUSE ELLIPTA) 62.5 MCG/INH 1 puff  1 puff Inhalation Daily Jonetta Osgood, MD   1 puff at 08/03/18 8841     Discharge Medications: Please see discharge summary for a list of discharge medications.  Relevant Imaging Results:  Relevant Lab  Results:   Additional Information SSN: [id number]  Alberteen Sam, LCSW

## 2018-08-03 NOTE — Plan of Care (Signed)
  Problem: Clinical Measurements: Goal: Respiratory complications will improve Outcome: Progressing   Problem: Nutrition: Goal: Adequate nutrition will be maintained Outcome: Progressing   Problem: Coping: Goal: Level of anxiety will decrease Outcome: Progressing   Problem: Pain Managment: Goal: General experience of comfort will improve Outcome: Progressing   Problem: Safety: Goal: Ability to remain free from injury will improve Outcome: Progressing

## 2018-08-04 LAB — CBC
HCT: 42.2 % (ref 36.0–46.0)
Hemoglobin: 12.9 g/dL (ref 12.0–15.0)
MCH: 29 pg (ref 26.0–34.0)
MCHC: 30.6 g/dL (ref 30.0–36.0)
MCV: 94.8 fL (ref 80.0–100.0)
Platelets: 158 10*3/uL (ref 150–400)
RBC: 4.45 MIL/uL (ref 3.87–5.11)
RDW: 14 % (ref 11.5–15.5)
WBC: 5 10*3/uL (ref 4.0–10.5)
nRBC: 0 % (ref 0.0–0.2)

## 2018-08-04 LAB — COMPREHENSIVE METABOLIC PANEL
ALT: 14 U/L (ref 0–44)
AST: 29 U/L (ref 15–41)
Albumin: 2.3 g/dL — ABNORMAL LOW (ref 3.5–5.0)
Alkaline Phosphatase: 41 U/L (ref 38–126)
Anion gap: 9 (ref 5–15)
BUN: 15 mg/dL (ref 8–23)
CO2: 26 mmol/L (ref 22–32)
Calcium: 7.7 mg/dL — ABNORMAL LOW (ref 8.9–10.3)
Chloride: 104 mmol/L (ref 98–111)
Creatinine, Ser: 0.82 mg/dL (ref 0.44–1.00)
GFR calc Af Amer: >60 mL/min (ref >60)
GFR calc non Af Amer: >60 mL/min (ref >60)
Glucose, Bld: 210 mg/dL — ABNORMAL HIGH (ref 70–99)
Potassium: 4.6 mmol/L (ref 3.5–5.1)
Sodium: 139 mmol/L (ref 135–145)
Total Bilirubin: 0.1 mg/dL — ABNORMAL LOW (ref 0.3–1.2)
Total Protein: 6.1 g/dL — ABNORMAL LOW (ref 6.5–8.1)

## 2018-08-04 LAB — LACTATE DEHYDROGENASE: LDH: 289 U/L — ABNORMAL HIGH (ref 98–192)

## 2018-08-04 LAB — GLUCOSE, CAPILLARY
Glucose-Capillary: 241 mg/dL — ABNORMAL HIGH (ref 70–99)
Glucose-Capillary: 194 mg/dL — ABNORMAL HIGH (ref 70–99)
Glucose-Capillary: 376 mg/dL — ABNORMAL HIGH (ref 70–99)
Glucose-Capillary: 205 mg/dL — ABNORMAL HIGH (ref 70–99)
Glucose-Capillary: 211 mg/dL — ABNORMAL HIGH (ref 70–99)

## 2018-08-04 LAB — PROCALCITONIN: Procalcitonin: <0.1 ng/mL

## 2018-08-04 LAB — C-REACTIVE PROTEIN: CRP: 1.1 mg/dL — ABNORMAL HIGH (ref <1.0)

## 2018-08-04 LAB — D-DIMER, QUANTITATIVE: D-Dimer, Quant: 1.53 ug/mL-FEU — ABNORMAL HIGH (ref 0.00–0.50)

## 2018-08-04 LAB — FERRITIN: Ferritin: 159 ng/mL (ref 11–307)

## 2018-08-04 MED ORDER — AMLODIPINE BESYLATE 5 MG PO TABS
5.0000 mg | ORAL_TABLET | Freq: Every day | ORAL | Status: DC
  Administered 2018-08-04 – 2018-08-05 (×2): 5 mg via ORAL
  Filled 2018-08-04 (×2): qty 1

## 2018-08-04 MED ORDER — ALBUTEROL SULFATE HFA 108 (90 BASE) MCG/ACT IN AERS
2.0000 | INHALATION_SPRAY | Freq: Three times a day (TID) | RESPIRATORY_TRACT | Status: DC
  Administered 2018-08-04 – 2018-08-05 (×4): 2 via RESPIRATORY_TRACT
  Filled 2018-08-04: qty 6.7

## 2018-08-04 MED ORDER — HYDRALAZINE HCL 20 MG/ML IJ SOLN
10.0000 mg | Freq: Four times a day (QID) | INTRAMUSCULAR | Status: DC | PRN
  Administered 2018-08-04 – 2018-08-06 (×4): 10 mg via INTRAVENOUS
  Filled 2018-08-04 (×4): qty 1

## 2018-08-04 MED ORDER — METOPROLOL TARTRATE 25 MG PO TABS
25.0000 mg | ORAL_TABLET | Freq: Two times a day (BID) | ORAL | Status: DC
  Administered 2018-08-04 – 2018-08-05 (×3): 25 mg via ORAL
  Filled 2018-08-04 (×3): qty 1

## 2018-08-04 NOTE — Progress Notes (Addendum)
PROGRESS NOTE                                                                                                                                                                                                             Patient Demographics:    [name] [name], is a 73 y.o. female, DOB - [birth year], EYC:[id number]  Outpatient Primary [facility] for the patient is [facility] [place]    LOS - 2  Chief Complaint  Patient presents with  . Fever       Brief Narrative: Patient is a 73 y.o. female with history of COPD, depression, DM-2, chronic diastolic heart failure, schizophrenia, dementia-presented with confusion-was found to have acute on chronic hypercarbic respiratory failure and COVID-19 viral pneumonia.  Due to rapid improvement-patient was able to avoid intubation.  See below for further details.   Subjective:   Much improved-awake-alert-eating breakfast this morning.   Assessment  & Plan :   Acute on chronic hypercapnic and hypoxemic respiratory failure: Hypercapnia has resolved with supportive care involving steroids and bronchodilators.  Hypoxemia secondary to COVID-19 viral pneumonia-since improving-do not think we need need to consider Remdesivir or Actemra at this point.  Since patient not felt to have bacterial pneumonia-old antimicrobial therapy was discontinued on 6/11.   Minimize use of narcotics/sedatives as much as possible  COVID-19 viral pneumonia: Bilateral infiltrates consistent with viral pneumonia-since procalcitonin was not elevated-all anti-biotics were discontinued on 6/11.  Continue steroids-very poor overall state of health-given very poor overall state of health-and the likelihood that patient's life expectancy is probably less than 6 months-not a candidate for Remdesivir as this is a scarce resource at this point.  Plan is to provide supportive care-and monitor   COVID-19 Labs:   Recent Labs    08/03/18 0748 08/04/18 0300  DDIMER 3.31* 1.53*  FERRITIN 165 159  LDH 286* 289*  CRP 4.1* 1.1*    Lab Results  Component Value Date   SARSCOV2NAA DETECTED (A) 07/31/2018     COVID-19 Medications: 6/10>> Solu-Medrol  Gram + cocci bacteremia: likely coag neg staph-probably contaminant-repeat cultures  Acute metabolic Encephalopathy: secondary to hypercarbia-resolved  Chronic diastolic heart failure: Appears relatively well compensated-BNP levels within normal limits.  Will attempt to keep in negative balance and provide as needed Lasix.  Insulin-dependent DM-2: CBGs relatively stable-continue Lantus 11 units nightly and  SSI.  Follow and adjust accordingly.    Schizophrenia: Continue Depakote and fluphenazine.  Hypothyroidism: Continue levothyroxine  Recent UTI: Has completed a course of antimicrobial therapy-repeat blood culture on 6/10--no need for any further antimicrobial therapy at this point  Dementia with risk for delirium: Supportive care at this point-resume all of her schizophrenic medications.  History of right renal mass-likely renal cell carcinoma: Supportive care-follow-up with her outpatient physicians.  Debility/deconditioning: Await evaluation by rehab services-but suspect this very frail at baseline-probably worsened due to acute illness with COVID-19 and hypercarbia.  Palliative care: DNR in place-multiple medical comorbidities-very frail appearing-clearly not a candidate for any further escalation in care.    ABG:    Component Value Date/Time   PHART 7.38 11/05/2017 0419   PCO2ART 73 (HH) 11/05/2017 0419   PO2ART 76 (L) 11/05/2017 0419   HCO3 29.7 (H) 08/01/2018 1510   TCO2 32 08/01/2018 1510   O2SAT 84.0 08/11/2018 1510    Condition - Guarded  Family Communication  :  Son updated over the phone  Code Status :  DNR  Diet :  Diet Order            Diet heart healthy/carb modified Room service appropriate? Yes; Fluid  consistency: Thin  Diet effective now               Disposition Plan  :  Remain inpatient- back to SNF early next week per social work  Consults  :  PCCM  Procedures  :  None  DVT Prophylaxis  :  Lovenox  Lab Results  Component Value Date   PLT 158 08/04/2018    Inpatient Medications  Scheduled Meds: . albuterol  2 puff Inhalation Q6H  . aspirin  81 mg Per Tube Daily  . atorvastatin  20 mg Per Tube q1800  . benztropine  0.5 mg Oral BID  . divalproex  500 mg Oral TID  . enoxaparin (LOVENOX) injection  40 mg Subcutaneous Q24H  . fentaNYL (SUBLIMAZE) injection  25 mcg Intravenous Once  . fluPHENAZine  5 mg Oral BID  . insulin aspart  0-9 Units Subcutaneous TID WC  . insulin glargine  11 Units Subcutaneous QHS  . levothyroxine  75 mcg Oral Daily  . methylPREDNISolone (SOLU-MEDROL) injection  40 mg Intravenous Q12H  . mometasone-formoterol  2 puff Inhalation BID  . pantoprazole  40 mg Oral Daily  . umeclidinium bromide  1 puff Inhalation Daily   Continuous Infusions: . sodium chloride Stopped (08/03/18 1729)   PRN Meds:.fentaNYL, fluPHENAZine  Antibiotics  :    Anti-infectives (From admission, onward)   Start     Dose/Rate Route Frequency Ordered Stop   08/03/18 1700  vancomycin (VANCOCIN) 1,250 mg in sodium chloride 0.9 % 250 mL IVPB  Status:  Discontinued     1,250 mg 166.7 mL/hr over 90 Minutes Intravenous Every 24 hours 07/31/2018 1609 08/03/18 1110   08/15/2018 1530  vancomycin (VANCOCIN) 2,000 mg in sodium chloride 0.9 % 500 mL IVPB     2,000 mg 250 mL/hr over 120 Minutes Intravenous  Once 07/31/2018 1523 08/16/2018 1912   08/17/2018 1515  ceFEPIme (MAXIPIME) 2 g in sodium chloride 0.9 % 100 mL IVPB  Status:  Discontinued     2 g 200 mL/hr over 30 Minutes Intravenous Every 8 hours 08/08/2018 1513 08/03/18 1110   08/13/2018 1245  cefTRIAXone (ROCEPHIN) 1 g in sodium chloride 0.9 % 100 mL IVPB     1 g 200 mL/hr over 30 Minutes Intravenous  Once 07/28/2018 1236 08/05/2018 1420    08/20/2018 1245  azithromycin (ZITHROMAX) 500 mg in sodium chloride 0.9 % 250 mL IVPB     500 mg 250 mL/hr over 60 Minutes Intravenous  Once 08/14/2018 1236 08/03/2018 1809       Time Spent in minutes  35    Oren Binet M.D on 08/04/2018 at 11:39 AM  To page go to [email or web address] - use universal password  [facility] -  Office  [phone]  See all Orders from today for further details   Admit date - 08/16/2018    2    Objective:   Vitals:   08/04/18 0146 08/04/18 0635 08/04/18 0758 08/04/18 1000  BP:  (!) 161/83 (!) 165/97   Pulse:  79 69 (!) 56  Resp:  (!) 25 (!) 25 (!) 25  Temp:  98.4 F (36.9 C) 98.1 F (36.7 C)   TempSrc:  Oral Axillary   SpO2:  98% 95%   Weight: 121.5 kg     Height:        Wt Readings from Last 3 Encounters:  08/04/18 121.5 kg  07/24/18 120 kg  04/03/18 120.4 kg     Intake/Output Summary (Last 24 hours) at 08/04/2018 1139 Last data filed at 08/04/2018 0900 Gross per 24 hour  Intake 1892.21 ml  Output 1000 ml  Net 892.21 ml     Physical Exam General appearance:Awake, alert, not in any distress.  Chronically sick appearing-very frail Eyes:no scleral icterus. HEENT: Atraumatic and Normocephalic Neck: supple, no JVD. Resp:Good air entry bilaterally, no rales/no rhonchi CVS: S1 S2 regular GI: Bowel sounds present, Non tender and not distended with no gaurding, rigidity or rebound. Extremities: B/L Lower Ext shows no edema, both legs are warm to touch Neurology: Generalized weakness-nonfocal Musculoskeletal:No digital cyanosis Skin:No Rash, warm and dry Wounds:N/A   Data Review:    CBC Recent Labs  Lab 08/06/2018 1055 07/29/2018 1309 08/08/2018 1510 08/03/18 0748 08/04/18 0300  WBC 6.5  --   --  4.9 5.0  HGB 13.4 13.9 13.3 12.6 12.9  HCT 43.0 41.0 39.0 42.7 42.2  PLT 155  --   --  145* 158  MCV 94.9  --   --  96.4 94.8  MCH 29.6  --   --  28.4 29.0  MCHC 31.2  --   --  29.5* 30.6  RDW 14.6  --   --  14.6 14.0   LYMPHSABS 1.2  --   --   --   --   MONOABS 1.0  --   --   --   --   EOSABS 0.0  --   --   --   --   BASOSABS 0.0  --   --   --   --     Chemistries  Recent Labs  Lab 07/30/2018 1055 08/10/2018 1309 07/26/2018 1510 08/03/18 0748 08/04/18 0300  NA 139 141 142 141 139  K 4.1 4.1 4.0 4.6 4.6  CL 103  --   --  106 104  CO2 26  --   --  26 26  GLUCOSE 105*  --   --  120* 210*  BUN 13  --   --  15 15  CREATININE 1.07*  --   --  0.78 0.82  CALCIUM 8.1*  --   --  7.5* 7.7*  MG  --   --   --  1.9  --   AST 35  --   --  30 29  ALT 13  --   --  15 14  ALKPHOS 42  --   --  41 41  BILITOT 0.5  --   --  0.2* 0.1*   ------------------------------------------------------------------------------------------------------------------ No results for input(s): CHOL, HDL, LDLCALC, TRIG, CHOLHDL, LDLDIRECT in the last 72 hours.  Lab Results  Component Value Date   HGBA1C 7.6 (H) 10/13/2015   ------------------------------------------------------------------------------------------------------------------ Recent Labs    08/12/2018 1055  TSH 6.497*   ------------------------------------------------------------------------------------------------------------------ Recent Labs    08/03/18 0748 08/04/18 0300  FERRITIN 165 159    Coagulation profile No results for input(s): INR, PROTIME in the last 168 hours.  Recent Labs    08/03/18 0748 08/04/18 0300  DDIMER 3.31* 1.53*    Cardiac Enzymes No results for input(s): CKMB, TROPONINI, MYOGLOBIN in the last 168 hours.  Invalid input(s): CK ------------------------------------------------------------------------------------------------------------------    Component Value Date/Time   BNP 218.4 (H) 08/03/2018 5284    Micro Results Recent Results (from the past 240 hour(s))  SARS Coronavirus 2     Status: Abnormal   Collection Time: 07/29/2018 10:00 AM  Result Value Ref Range Status   SARS Coronavirus 2 DETECTED (A) NOT DETECTED Final     Comment: RESULT CALLED TO, READ BACK BY AND VERIFIED WITH: E. Pulliam RN 12:30 07/28/2018 ([email or web address]) (NOTE) SARS-CoV-2 target nucleic acids are DETECTED. The SARS-CoV-2 RNA is generally detectable in upper and lower respiratory specimens during the acute phase of infection. Positive results are indicative of active infection with SARS-CoV-2. Clinical  correlation with patient history and other diagnostic information is necessary to determine patient infection status. Positive results do  not rule out bacterial infection or co-infection with other viruses. The expected result is Not Detected. Fact Sheet for Patients: http://www.biofiredefense.com/wp-content/uploads/2020/03/BIOFIRE-COVID -19-patients.pdf Fact Sheet for Healthcare Providers: http://www.biofiredefense.com/wp-content/uploads/2020/03/BIOFIRE-COVID -19-hcp.pdf This test is not yet approved or cleared by the [place] and  has been authorized for detection and/or diagnosis of SARS-CoV-2 by FDA under an Emergency  Use Authorization (EUA).  This EUA will remain in effect (meaning this test can be used) for the duration of  the COVID-19 declaration under Section 564(b)(1) of the Act, 21 U.S.C. section [phone] 3(b)(1), unless the authorization is terminated or revoked sooner. Performed at [facility], [place] [address] [place], [place] [address]   Blood Culture (routine x 2)     Status: None (Preliminary result)   Collection Time: 08/11/2018 10:22 AM   Specimen: BLOOD RIGHT HAND  Result Value Ref Range Status   Specimen Description BLOOD RIGHT HAND  Final   Special Requests   Final    BOTTLES DRAWN AEROBIC AND ANAEROBIC Blood Culture adequate volume   Culture  Setup Time   Final    GRAM POSITIVE COCCI AEROBIC BOTTLE ONLY CRITICAL VALUE NOTED.  VALUE IS CONSISTENT WITH PREVIOUSLY REPORTED AND CALLED VALUE. Performed at [facility], [place] [address] [place], [place] [address]    Culture GRAM POSITIVE COCCI   Final   Report Status PENDING  Incomplete  Urine culture     Status: None   Collection Time: 08/01/2018 10:22 AM   Specimen: Urine, Catheterized  Result Value Ref Range Status   Specimen Description URINE, CATHETERIZED  Final   Special Requests Normal  Final   Culture   Final    NO GROWTH Performed at [facility], [place] [address], [place], [place] [address]    Report Status 08/03/2018 FINAL  Final  Blood Culture (routine x 2)  Status: None (Preliminary result)   Collection Time: 08/13/2018 10:27 AM   Specimen: BLOOD LEFT HAND  Result Value Ref Range Status   Specimen Description BLOOD LEFT HAND  Final   Special Requests   Final    BOTTLES DRAWN AEROBIC AND ANAEROBIC Blood Culture adequate volume   Culture  Setup Time   Final    GRAM POSITIVE COCCI AEROBIC BOTTLE ONLY Organism ID to follow CRITICAL RESULT CALLED TO, READ BACK BY AND VERIFIED WITH: Jene Every PharmD 10:25 08/03/18 ([email or web address]) Performed at [facility], [address], [place], [place] [address]    Culture GRAM POSITIVE COCCI  Final   Report Status PENDING  Incomplete  Blood Culture ID Panel (Reflexed)     Status: Abnormal   Collection Time: 08/10/2018 10:27 AM  Result Value Ref Range Status   Enterococcus species NOT DETECTED NOT DETECTED Final   Listeria monocytogenes NOT DETECTED NOT DETECTED Final   Staphylococcus species DETECTED (A) NOT DETECTED Final    Comment: Methicillin (oxacillin) resistant coagulase negative staphylococcus. Possible blood culture contaminant (unless isolated from more than one blood culture draw or clinical case suggests pathogenicity). No antibiotic treatment is indicated for blood  culture contaminants. CRITICAL RESULT CALLED TO, READ BACK BY AND VERIFIED WITH: Jene Every PharmD 10:25 08/03/18 ([email or web address])    Staphylococcus aureus (BCID) NOT DETECTED NOT DETECTED Final   Methicillin resistance DETECTED (A) NOT DETECTED Final    Comment: CRITICAL RESULT CALLED TO, READ BACK BY AND  VERIFIED WITH: Jene Every PharmD 10:25 08/03/18 ([email or web address])    Streptococcus species NOT DETECTED NOT DETECTED Final   Streptococcus agalactiae NOT DETECTED NOT DETECTED Final   Streptococcus pneumoniae NOT DETECTED NOT DETECTED Final   Streptococcus pyogenes NOT DETECTED NOT DETECTED Final   Acinetobacter baumannii NOT DETECTED NOT DETECTED Final   Enterobacteriaceae species NOT DETECTED NOT DETECTED Final   Enterobacter cloacae complex NOT DETECTED NOT DETECTED Final   Escherichia coli NOT DETECTED NOT DETECTED Final   Klebsiella oxytoca NOT DETECTED NOT DETECTED Final   Klebsiella pneumoniae NOT DETECTED NOT DETECTED Final   Proteus species NOT DETECTED NOT DETECTED Final   Serratia marcescens NOT DETECTED NOT DETECTED Final   Haemophilus influenzae NOT DETECTED NOT DETECTED Final   Neisseria meningitidis NOT DETECTED NOT DETECTED Final   Pseudomonas aeruginosa NOT DETECTED NOT DETECTED Final   Candida albicans NOT DETECTED NOT DETECTED Final   Candida glabrata NOT DETECTED NOT DETECTED Final   Candida krusei NOT DETECTED NOT DETECTED Final   Candida parapsilosis NOT DETECTED NOT DETECTED Final   Candida tropicalis NOT DETECTED NOT DETECTED Final    Comment: Performed at [facility], [place]. [address], [place], [place] [address]  MRSA PCR Screening     Status: None   Collection Time: 08/06/2018 11:35 PM   Specimen: Nasopharyngeal  Result Value Ref Range Status   MRSA by PCR NEGATIVE NEGATIVE Final    Comment:        The GeneXpert MRSA Assay (FDA approved for NASAL specimens only), is one component of a comprehensive MRSA colonization surveillance program. It is not intended to diagnose MRSA infection nor to guide or monitor treatment for MRSA infections. Performed at [facility], [place] [address] [place], [place] [address]     Radiology Reports Dg Chest Port 1 View  Result Date: 08/03/2018 CLINICAL DATA:  Encounter for respiratory failure. EXAM:  PORTABLE CHEST 1 VIEW COMPARISON:  One-view chest x-ray 07/29/2018 FINDINGS: Heart is enlarged. Lung volumes  are low. Progressive bilateral airspace disease is noted, left greater than right. Mild pulmonary vascular congestion is again seen. There is some fluid along the right minor fissure and at the right base. IMPRESSION: 1. Progressive bilateral airspace disease, most prominent at the lung bases, left greater than right. 2. Right pleural effusion. Electronically Signed   By: San Morelle M.D.   On: 08/03/2018 06:05   Dg Chest Port 1 View  Result Date: 07/24/2018 CLINICAL DATA:  Altered mental status.  Hypoxia. EXAM: PORTABLE CHEST 1 VIEW COMPARISON:  One-view chest x-ray 03/26/2018 FINDINGS: The heart is enlarged. Lung volumes are low. Patchy bilateral airspace opacities are present. No significant focal consolidation is present. There is no edema or effusion. Visualized soft tissues and bony thorax are unremarkable. IMPRESSION: 1. Stable cardiomegaly without failure. 2. Patchy bilateral airspace disease. This is concerning for atypical infection. Edema is considered less likely. 3. Decreased lung volumes. Electronically Signed   By: San Morelle M.D.   On: 08/03/2018 11:53

## 2018-08-04 NOTE — Progress Notes (Addendum)
Occupational Therapy Evaluation Patient Details Name: [name] [name] MRN: [id number] DOB: October 09, 1945 Today's Date: 08/04/2018   Clinical Impression: Pt came from [facility], she was supposedly using a RW and assist according to her son, assist for LB ADL, feeding herself and grooming herself. TOday she presents as max to total A for LB ADL, Mod to max A for feeding and grooming at bed level. She was able to come to EOB with max A, stand from EOB max A with gait belt, mod A +2 for pivot to Recliner with RW (improved balance today - posterior lean still present but not as strong). Pt BUE with stiffness impacting self-feeding and grooming. Flexion is harder than extension, and Pt grimaces with ROM at the end of range. Pt required assistance to talk on the phone today (initially able to hold phone up to ear, but RUE fatigues and requires assistance) to Denman George (her niece) and to the Patients Brother. VSS throughout session. On 3LO2 throughout the session, RR 21-34, O2 stayed >90 with brief dip to 87 during transfer, and HR remained 67-83. While her Vitals remained stable, Pt with DOE 2/4 even with talking on the phone. OT will follow acutely and pt will require SNF level care to maximize safety and independence in ADL and functional transfers.   08/04/18 1000  OT Visit Information  Last OT Received On 08/04/18  Assistance Needed +2  History of Present Illness 73 y.o. female admitted on 07/24/2018 for fever.  Pt dx with acute on chronic hypercarbic respiratory failure and COVID 19 viral PNA.  Pt with significant PMH of schizophrenia, OA, obseity, LVH, LE edema, HTN, DM2, COPD, CKD, CHF, CAD, R TKA.    Precautions  Precautions Fall;Other (comment)  Precaution Comments monitor sats  Restrictions  Weight Bearing Restrictions No  Home Living  Family/patient expects to be discharged to: Skilled nursing facility ([facility])  Prior Function  Level of Independence Needs assistance  Gait / Transfers  Assistance Needed per son, he thinks she was ambulatory, but has not had eyes on her in months due to [place] restrictions at [facility].  ADL's / Homemaking Assistance Needed pt was unable to feed herself when I evaluated her as bil UEs are very stiff.   Communication  Communication No difficulties  Pain Assessment  Pain Assessment Faces  Faces Pain Scale 4  Pain Location bil UE with end ROM  Pain Descriptors / Indicators Guarding;Grimacing  Pain Intervention(s) Limited activity within patient's tolerance;Monitored during session;Repositioned  Cognition  Arousal/Alertness Awake/alert  Behavior During Therapy WFL for tasks assessed/performed  Overall Cognitive Status History of cognitive impairments - at baseline  General Comments Pt with h/o cognitive impairments in and out of lucidity with me.  Used to work as an Corporate treasurer. Knows her family and laughs at jokes  Upper Extremity Assessment  Upper Extremity Assessment RUE deficits/detail;LUE deficits/detail  RUE Deficits / Details pain with End ROM, flexion harder than extension; grossly 3+/5  RUE Coordination decreased fine motor;decreased gross motor  LUE Deficits / Details pain with End ROM, flexion harder than extension; grossly 3+/5  LUE Coordination decreased fine motor;decreased gross motor  Lower Extremity Assessment  Lower Extremity Assessment Defer to PT evaluation  Cervical / Trunk Assessment  Cervical / Trunk Assessment Kyphotic  ADL  Overall ADL's  Needs assistance/impaired  Eating/Feeding Maximal assistance;Sitting  Eating/Feeding Details (indicate cue type and reason) can bring arm to mouth with assist/support under elbow, decreased fine motor for grasping utensils  Grooming Maximal assistance;Wash/dry face;Sitting  Grooming Details (indicate cue type and reason) HOH assist with support to elevate from elbow  Upper Body Bathing Moderate assistance;Bed level  Lower Body Bathing Total assistance  Upper Body Dressing  Maximal assistance   Lower Body Dressing Total assistance  Toilet Transfer Maximal assistance;+2 for physical assistance;+2 for safety/equipment;Stand-pivot;Requires wide/bariatric  Toilet Transfer Details (indicate cue type and reason) assist for boost, decreased posterior lean today  Toileting- Clothing Manipulation and Hygiene Maximal assistance;+2 for physical assistance;+2 for safety/equipment  Toileting - Clothing Manipulation Details (indicate cue type and reason) dependent on BUE support in standing, 2nd person perform peri care  Functional mobility during ADLs Moderate assistance;+2 for physical assistance;+2 for safety/equipment;Rolling walker;Cueing for sequencing  Bed Mobility  Overal bed mobility Needs Assistance  Bed Mobility Supine to Sit  Supine to sit Max assist;HOB elevated  General bed mobility comments multimodal cues for sequencing for all aspects of bed mobility. Heavy use of pad to bring hips EOB, heavy trunk elevation  Transfers  Overall transfer level Needs assistance  Equipment used Rolling walker (2 wheeled);1 person hand held assist  Transfers Sit to/from [employer] Transfers  Sit to Stand Max assist;From elevated surface (EOB, face to face initial strong posterior lean)  Stand pivot transfers Mod assist;+2 physical assistance;+2 safety/equipment (w/RW, Pt with better balance, multimodal cues)  Balance  Overall balance assessment Needs assistance  Sitting-balance support Feet supported;No upper extremity supported;Bilateral upper extremity supported  Sitting balance-Leahy Scale Poor  Sitting balance - Comments min A for seated support  Postural control Posterior lean;Right lateral lean  Standing balance support Bilateral upper extremity supported  Standing balance-Leahy Scale Poor  Standing balance comment Two person mod assist with support of RW in standing.   OT - End of Session  Equipment Utilized During Treatment Gait belt;Rolling walker;Oxygen (3L)  Activity  Tolerance Patient tolerated treatment well  Patient left in chair;with call bell/phone within reach;with nursing/sitter in room  Nurse Communication Mobility status;Precautions  OT Assessment  OT Recommendation/Assessment Patient needs continued OT Services  OT Visit Diagnosis Unsteadiness on feet (R26.81);Muscle weakness (generalized) (M62.81)  OT Plan  OT Frequency (ACUTE ONLY) Min 2X/week  OT Treatment/Interventions (ACUTE ONLY) Self-care/ADL training;Therapeutic exercise;Energy conservation;DME and/or AE instruction;Therapeutic activities;Patient/family education;Balance training  AM-PAC OT "6 Clicks" Daily Activity Outcome Measure (Version 2)  Help from another person eating meals? 2  Help from another person taking care of personal grooming? 2  Help from another person toileting, which includes using toliet, bedpan, or urinal? 2  Help from another person bathing (including washing, rinsing, drying)? 2  Help from another person to put on and taking off regular upper body clothing? 2  Help from another person to put on and taking off regular lower body clothing? 1  6 Click Score 11  OT Recommendation  Follow Up Recommendations SNF;Supervision/Assistance - 24 hour (wants to go somewhere other than [facility])  OT Equipment None recommended by OT (defer to next venue)  Individuals Consulted  Consulted and Agree with Results and Recommendations Patient  Acute Rehab OT Goals  Patient Stated Goal to get stronger  OT Goal Formulation With patient  Time For Goal Achievement 08/18/18  Potential to Achieve Goals Good  OT Time Calculation  OT Start Time (ACUTE ONLY) 6384  OT Stop Time (ACUTE ONLY) 1039  OT Time Calculation (min) 61 min  OT General Charges  $OT Visit 1 Visit  OT Evaluation  $OT Eval Moderate Complexity 1 Mod  OT Treatments  $Self Care/Home Management  23-37 mins  $  Therapeutic Activity 8-22 mins  Written Expression  Dominant Hand Right   Hulda Humphrey  OTR/L Acute Rehabilitation Services Pager: [phone] Office: [phone]

## 2018-08-04 NOTE — Progress Notes (Signed)
Dr. Curly Rim because pt BP is still elevated despite giving prior medication for HTN. See new orders.     08/04/18 2140  Vitals  BP (!) 192/98  MAP (mmHg) 127  Pulse Rate 91

## 2018-08-04 NOTE — Progress Notes (Signed)
Occupational Therapy Treatment Patient Details Name: CARRY WEESNER MRN: [id number] DOB: [birth year] Today's Date: 08/04/2018    History of present illness 73 y.o. female admitted on 08/03/2018 for fever.  Pt dx with acute on chronic hypercarbic respiratory failure and COVID 19 viral PNA.  Pt with significant PMH of schizophrenia, OA, obseity, LVH, LE edema, HTN, DM2, COPD, CKD, CHF, CAD, R TKA.     OT comments  Pt progressing towards OT goals this session. +2 max A to reposition in the chair (Pt had slid down in recliner) to bring upright. Able to complete oral care and face wash with max A. BUE fatigue quickly, lose grasp on toothbrush, and Pt needs support under elbow to achieve flexion/elevation to reach mouth. At end of session, Pt was able to [facility] with son [name] and his family (in [place]) and had the biggest smile the whole time. VSS throughout session on 3L O2. Pt continues to get DOE 2/4 even with talking. OT will continue to follow acutely.    Follow Up Recommendations  SNF;Supervision/Assistance - 24 hour    Equipment Recommendations  Other (comment)(defer to next venue of care)    Recommendations for Other Services      Precautions / Restrictions Precautions Precautions: Fall;Other (comment) Precaution Comments: monitor sats       Mobility Bed Mobility                  Transfers                 General transfer comment: NT this session    Balance                                           ADL either performed or assessed with clinical judgement   ADL Overall ADL's : Needs assistance/impaired Eating/Feeding: Maximal assistance;Sitting Eating/Feeding Details (indicate cue type and reason): for drinking from straw/cup Grooming: Oral care;Maximal assistance;Sitting Grooming Details (indicate cue type and reason): in recliner, Pt was able to get 20% of task done with assist from therapist at elbow to bring toothbrush to mouth. Then OT  completed task for thoroughness                                     Vision       Perception     Praxis      Cognition Arousal/Alertness: Awake/alert(initially sleeping, but easy to wake) Behavior During Therapy: [facility] for tasks assessed/performed Overall Cognitive Status: History of cognitive impairments - at baseline                                          Exercises     Shoulder Instructions       General Comments Pt was able to facetime with son [name] and his Family (in [place]) contacted through [email or web address]    Pertinent Vitals/ Pain       Pain Assessment: No/denies pain Pain Intervention(s): Limited activity within patient's tolerance;Monitored during session  Home Living  Prior Functioning/Environment              Frequency  Min 2X/week        Progress Toward Goals  OT Goals(current goals can now be found in the care plan section)  Progress towards OT goals: Progressing toward goals  Acute Rehab OT Goals Patient Stated Goal: to get stronger OT Goal Formulation: With patient Time For Goal Achievement: 08/18/18 Potential to Achieve Goals: Good ADL Goals Pt Will Perform Eating: with supervision;with adaptive utensils;sitting Pt Will Perform Grooming: with set-up;sitting Pt Will Perform Upper Body Bathing: with min assist;sitting Pt Will Perform Lower Body Bathing: with mod assist;sitting/lateral leans Pt Will Transfer to Toilet: with mod assist;ambulating Pt Will Perform Toileting - Clothing Manipulation and hygiene: with min assist;sit to/from stand Pt/caregiver will Perform Home Exercise Program: Both right and left upper extremity;With Supervision;With written HEP provided Additional ADL Goal #1: Pt will perform bed mobility with min A prior to engaging in ADL  Plan Discharge plan remains appropriate    Co-evaluation                  AM-PAC OT "6 Clicks" Daily Activity     Outcome Measure   Help from another person eating meals?: A Lot Help from another person taking care of personal grooming?: A Lot Help from another person toileting, which includes using toliet, bedpan, or urinal?: A Lot Help from another person bathing (including washing, rinsing, drying)?: A Lot Help from another person to put on and taking off regular upper body clothing?: A Lot Help from another person to put on and taking off regular lower body clothing?: Total 6 Click Score: 11    End of Session Equipment Utilized During Treatment: Oxygen(3L)  OT Visit Diagnosis: Unsteadiness on feet (R26.81);Muscle weakness (generalized) (M62.81)   Activity Tolerance Patient tolerated treatment well   Patient Left in chair;with call bell/phone within reach;with nursing/sitter in room   Nurse Communication Mobility status;Precautions;Need for lift equipment(use STEDY)        Time: 7353-2992 OT Time Calculation (min): 27 min  Charges: OT General Charges $OT Visit: 1 Visit OT Treatments $Self Care/Home Management : 8-22 mins $Therapeutic Activity: 8-22 mins  Hulda Humphrey OTR/L Acute Rehabilitation Services Pager: [phone] Office: [place] 08/04/2018, 5:54 PM

## 2018-08-04 NOTE — Plan of Care (Signed)
  Problem: Education: ?Goal: Knowledge of General Education information will improve ?Description: Including pain rating scale, medication(s)/side effects and non-pharmacologic comfort measures ?Outcome: Progressing ?  ?Problem: Clinical Measurements: ?Goal: Ability to maintain clinical measurements within normal limits will improve ?Outcome: Progressing ?Goal: Respiratory complications will improve ?Outcome: Progressing ?  ?Problem: Activity: ?Goal: Risk for activity intolerance will decrease ?Outcome: Progressing ?  ?Problem: Nutrition: ?Goal: Adequate nutrition will be maintained ?Outcome: Progressing ?  ?Problem: Coping: ?Goal: Level of anxiety will decrease ?Outcome: Progressing ?  ?Problem: Pain Managment: ?Goal: General experience of comfort will improve ?Outcome: Progressing ?  ?Problem: Safety: ?Goal: Ability to remain free from injury will improve ?Outcome: Progressing ?  ?Problem: Skin Integrity: ?Goal: Risk for impaired skin integrity will decrease ?Outcome: Progressing ?  ?

## 2018-08-04 NOTE — Progress Notes (Signed)
Patient spoke with son on the phone.

## 2018-08-05 ENCOUNTER — Inpatient Hospital Stay (HOSPITAL_COMMUNITY): Payer: Medicare HMO

## 2018-08-05 LAB — CBC
HCT: 49.7 % — ABNORMAL HIGH (ref 36.0–46.0)
Hemoglobin: 15.7 g/dL — ABNORMAL HIGH (ref 12.0–15.0)
MCH: 29 pg (ref 26.0–34.0)
MCHC: 31.6 g/dL (ref 30.0–36.0)
MCV: 91.9 fL (ref 80.0–100.0)
Platelets: 207 10*3/uL (ref 150–400)
RBC: 5.41 MIL/uL — ABNORMAL HIGH (ref 3.87–5.11)
RDW: 13.9 % (ref 11.5–15.5)
WBC: 14.4 10*3/uL — ABNORMAL HIGH (ref 4.0–10.5)
nRBC: 0.3 % — ABNORMAL HIGH (ref 0.0–0.2)

## 2018-08-05 LAB — CULTURE, BLOOD (ROUTINE X 2)
Special Requests: ADEQUATE
Special Requests: ADEQUATE

## 2018-08-05 LAB — C-REACTIVE PROTEIN: CRP: 0.8 mg/dL (ref <1.0)

## 2018-08-05 LAB — COMPREHENSIVE METABOLIC PANEL
ALT: 19 U/L (ref 0–44)
AST: 30 U/L (ref 15–41)
Albumin: 3.1 g/dL — ABNORMAL LOW (ref 3.5–5.0)
Alkaline Phosphatase: 57 U/L (ref 38–126)
Anion gap: 12 (ref 5–15)
BUN: 17 mg/dL (ref 8–23)
CO2: 32 mmol/L (ref 22–32)
Calcium: 9.1 mg/dL (ref 8.9–10.3)
Chloride: 96 mmol/L — ABNORMAL LOW (ref 98–111)
Creatinine, Ser: 0.81 mg/dL (ref 0.44–1.00)
GFR calc Af Amer: >60 mL/min (ref >60)
GFR calc non Af Amer: >60 mL/min (ref >60)
Glucose, Bld: 222 mg/dL — ABNORMAL HIGH (ref 70–99)
Potassium: 4.3 mmol/L (ref 3.5–5.1)
Sodium: 140 mmol/L (ref 135–145)
Total Bilirubin: <0.1 mg/dL — ABNORMAL LOW (ref 0.3–1.2)
Total Protein: 7.8 g/dL (ref 6.5–8.1)

## 2018-08-05 LAB — GLUCOSE, CAPILLARY
Glucose-Capillary: 255 mg/dL — ABNORMAL HIGH (ref 70–99)
Glucose-Capillary: 323 mg/dL — ABNORMAL HIGH (ref 70–99)
Glucose-Capillary: 245 mg/dL — ABNORMAL HIGH (ref 70–99)

## 2018-08-05 LAB — D-DIMER, QUANTITATIVE: D-Dimer, Quant: 1.56 ug/mL-FEU — ABNORMAL HIGH (ref 0.00–0.50)

## 2018-08-05 LAB — PROCALCITONIN: Procalcitonin: <0.1 ng/mL

## 2018-08-05 LAB — FERRITIN: Ferritin: 201 ng/mL (ref 11–307)

## 2018-08-05 LAB — LACTATE DEHYDROGENASE: LDH: 410 U/L — ABNORMAL HIGH (ref 98–192)

## 2018-08-05 MED ORDER — PREDNISONE 20 MG PO TABS
40.0000 mg | ORAL_TABLET | Freq: Every day | ORAL | Status: DC
  Administered 2018-08-06: 40 mg via ORAL
  Filled 2018-08-05: qty 2

## 2018-08-05 NOTE — Progress Notes (Signed)
Attempted to call son, kelly, per pt request. No answer.

## 2018-08-05 NOTE — Progress Notes (Signed)
Dr notified of expiratory wheezes, tachycardia, and tachypnea. Awaiting orders.

## 2018-08-05 NOTE — Progress Notes (Signed)
PROGRESS NOTE                                                                                                                                                                                                             Patient Demographics:    [name] [name], is a 73 y.o. female, DOB - 03-08-1945, BJS:[id number]  Outpatient Primary [facility] for the [facility] is [facility] [place]    LOS - 3  Chief Complaint  Patient presents with  . Fever       Brief Narrative: Patient is a 73 y.o. female with history of COPD, depression, DM-2, chronic diastolic heart failure, schizophrenia, dementia-presented with confusion-was found to have acute on chronic hypercarbic respiratory failure and COVID-19 viral pneumonia.  Due to rapid improvement-patient was able to avoid intubation.  See below for further details.   Subjective:   Awake/alert-comfortable and eating breakfast this morning.  No major events overnight.   Assessment  & Plan :   Acute on chronic hypercapnic and hypoxemic respiratory failure: Hypercapnia has resolved with supportive care involving steroids and bronchodilators.  Hypoxemia secondary to COVID-19 viral pneumonia-since improving-do not think we need need to consider Remdesivir or Actemra at this point.  Since patient not felt to have bacterial pneumonia-old antimicrobial therapy was discontinued on 6/11.   Minimize use of narcotics/sedatives as much as possible  COPD exacerbation: Likely causing hypercapnia-probably worsened by psychotropic medications.  Much improved-no wheezing-continue bronchodilators-transition from Solu-Medrol to prednisone.  COVID-19 viral pneumonia: Bilateral infiltrates consistent with viral pneumonia-since procalcitonin was not elevated-all anti-biotics were discontinued on 6/11.  Continue steroids-very poor overall state of health-given very poor overall state of health-and the  likelihood that patient's life expectancy is probably less than 6 months-not a candidate for Remdesivir as this is a scarce resource at this point.  Plan is to provide supportive care-and monitor   COVID-19 Labs:  Recent Labs    08/03/18 0748 08/04/18 0300 08/05/18 0400 08/05/18 0850  DDIMER 3.31* 1.53* 1.56*  --   FERRITIN 165 159  --   --   LDH 286* 289*  --  410*  CRP 4.1* 1.1*  --   --     Lab Results  Component Value Date   SARSCOV2NAA DETECTED (A) 08/22/2018     COVID-19 Medications: 6/10>> Solu-Medrol  Gram + cocci bacteremia: likely  coag neg staph-probably contaminant-repeat cultures on 6/12-negative so far-continue to follow.  No need for initiating antimicrobial therapy at this time.  Acute metabolic Encephalopathy: secondary to hypercarbia-resolved  Chronic diastolic heart failure: Appears relatively well compensated-BNP levels within normal limits.  Will attempt to keep in negative balance and provide as needed Lasix.  Insulin-dependent DM-2: CBGs relatively stable-continue Lantus 11 units nightly and SSI.  Follow and adjust accordingly.    Hypertension: Remains uncontrolled-just been started on amlodipine earlier-continue metoprolol.  Follow and optimize.  Schizophrenia: Continue Depakote and fluphenazine.  Hypothyroidism: Continue levothyroxine  Recent UTI: Has completed a course of antimicrobial therapy-repeat blood culture on 6/10--no need for any further antimicrobial therapy at this point  Dementia with risk for delirium: Supportive care at this point-resume all of her schizophrenic medications.  History of right renal mass-likely renal cell carcinoma: Supportive care-follow-up with her outpatient physicians.  Debility/deconditioning: Await evaluation by rehab services-but suspect this very frail at baseline-probably worsened due to acute illness with COVID-19 and hypercarbia.  Palliative care: DNR in place-multiple medical comorbidities-very  frail appearing-clearly not a candidate for any further escalation in care.    ABG:    Component Value Date/Time   PHART 7.38 11/05/2017 0419   PCO2ART 73 (HH) 11/05/2017 0419   PO2ART 76 (L) 11/05/2017 0419   HCO3 29.7 (H) 07/29/2018 1510   TCO2 32 08/08/2018 1510   O2SAT 84.0 08/01/2018 1510    Condition - Guarded  Family Communication  :  Son updated over the phone on 6/13-he does not want his mother to go back to her prior SNF (was threatening a lawsuit-I discussed the option of him taking patient home if he really does not like any of the facilities)-however he does realize that given COVID-19 pandemic-they are very limited SNF that will accept such patients.  Social worker is currently working on finding a bed-we will Secretary/administrator.  Code Status :  DNR  Diet :  Diet Order            Diet heart healthy/carb modified Room service appropriate? Yes; Fluid consistency: Thin  Diet effective now               Disposition Plan  :  Remain inpatient- back to SNF early next week per social work   Consults  :  PCCM  Procedures  :  None  DVT Prophylaxis  :  Lovenox  Lab Results  Component Value Date   PLT 207 08/05/2018    Inpatient Medications  Scheduled Meds: . albuterol  2 puff Inhalation TID  . amLODipine  5 mg Oral Daily  . aspirin  81 mg Per Tube Daily  . atorvastatin  20 mg Per Tube q1800  . benztropine  0.5 mg Oral BID  . divalproex  500 mg Oral TID  . enoxaparin (LOVENOX) injection  40 mg Subcutaneous Q24H  . fentaNYL (SUBLIMAZE) injection  25 mcg Intravenous Once  . fluPHENAZine  5 mg Oral BID  . insulin aspart  0-9 Units Subcutaneous TID WC  . insulin glargine  11 Units Subcutaneous QHS  . levothyroxine  75 mcg Oral Daily  . methylPREDNISolone (SOLU-MEDROL) injection  40 mg Intravenous Q12H  . metoprolol tartrate  25 mg Oral BID  . mometasone-formoterol  2 puff Inhalation BID  . pantoprazole  40 mg Oral Daily  . umeclidinium bromide  1  puff Inhalation Daily   Continuous Infusions: . sodium chloride Stopped (08/03/18 1729)   PRN Meds:.fentaNYL, fluPHENAZine, hydrALAZINE  Antibiotics  :    Anti-infectives (From admission, onward)   Start     Dose/Rate Route Frequency Ordered Stop   08/03/18 1700  vancomycin (VANCOCIN) 1,250 mg in sodium chloride 0.9 % 250 mL IVPB  Status:  Discontinued     1,250 mg 166.7 mL/hr over 90 Minutes Intravenous Every 24 hours 07/29/2018 1609 08/03/18 1110   08/14/2018 1530  vancomycin (VANCOCIN) 2,000 mg in sodium chloride 0.9 % 500 mL IVPB     2,000 mg 250 mL/hr over 120 Minutes Intravenous  Once 08/18/2018 1523 08/15/2018 1912   07/26/2018 1515  ceFEPIme (MAXIPIME) 2 g in sodium chloride 0.9 % 100 mL IVPB  Status:  Discontinued     2 g 200 mL/hr over 30 Minutes Intravenous Every 8 hours 08/01/2018 1513 08/03/18 1110   08/01/2018 1245  cefTRIAXone (ROCEPHIN) 1 g in sodium chloride 0.9 % 100 mL IVPB     1 g 200 mL/hr over 30 Minutes Intravenous  Once 08/13/2018 1236 08/11/2018 1420   07/25/2018 1245  azithromycin (ZITHROMAX) 500 mg in sodium chloride 0.9 % 250 mL IVPB     500 mg 250 mL/hr over 60 Minutes Intravenous  Once 07/24/2018 1236 08/08/2018 1809       Time Spent in minutes  35    Oren Binet M.D on 08/05/2018 at 11:03 AM  To page go to [email or web address] - use universal password  [facility] -  Office  [phone]  See all Orders from today for further details   Admit date - 08/06/2018    3    Objective:   Vitals:   08/05/18 0032 08/05/18 0400 08/05/18 0500 08/05/18 0536  BP: (!) 192/86   (!) 170/97  Pulse:    86  Resp:    (!) 40  Temp:  98.2 F (36.8 C)    TempSrc:  Oral    SpO2:    91%  Weight:   120.3 kg   Height:        Wt Readings from Last 3 Encounters:  08/05/18 120.3 kg  07/24/18 120 kg  04/03/18 120.4 kg     Intake/Output Summary (Last 24 hours) at 08/05/2018 1103 Last data filed at 08/05/2018 0921 Gross per 24 hour  Intake 1980 ml  Output 3100 ml  Net  -1120 ml     Physical Exam General appearance:Awake, alert, not in any distress.  Chronically sick appearing. Eyes:no scleral icterus. HEENT: Atraumatic and Normocephalic Neck: supple, no JVD. Resp:Good air entry bilaterally,no rales or rhonchi CVS: S1 S2 regular, no murmurs.  GI: Bowel sounds present, Non tender and not distended with no gaurding, rigidity or rebound. Extremities: B/L Lower Ext shows no edema, both legs are warm to touch Neurology:  Non focal-but has generalized weakness Skin:No Rash, warm and dry Wounds:N/A   Data Review:    CBC Recent Labs  Lab 08/07/2018 1055 07/27/2018 1309 08/12/2018 1510 08/03/18 0748 08/04/18 0300 08/05/18 0400  WBC 6.5  --   --  4.9 5.0 14.4*  HGB 13.4 13.9 13.3 12.6 12.9 15.7*  HCT 43.0 41.0 39.0 42.7 42.2 49.7*  PLT 155  --   --  145* 158 207  MCV 94.9  --   --  96.4 94.8 91.9  MCH 29.6  --   --  28.4 29.0 29.0  MCHC 31.2  --   --  29.5* 30.6 31.6  RDW 14.6  --   --  14.6 14.0 13.9  LYMPHSABS 1.2  --   --   --   --   --  MONOABS 1.0  --   --   --   --   --   EOSABS 0.0  --   --   --   --   --   BASOSABS 0.0  --   --   --   --   --     Chemistries  Recent Labs  Lab 08/06/2018 1055 08/08/2018 1309 07/25/2018 1510 08/03/18 0748 08/04/18 0300 08/05/18 0850  NA 139 141 142 141 139 140  K 4.1 4.1 4.0 4.6 4.6 4.3  CL 103  --   --  106 104 96*  CO2 26  --   --  26 26 32  GLUCOSE 105*  --   --  120* 210* 222*  BUN 13  --   --  15 15 17   CREATININE 1.07*  --   --  0.78 0.82 0.81  CALCIUM 8.1*  --   --  7.5* 7.7* 9.1  MG  --   --   --  1.9  --   --   AST 35  --   --  30 29 30   ALT 13  --   --  15 14 19   ALKPHOS 42  --   --  41 41 57  BILITOT 0.5  --   --  0.2* 0.1* <0.1*   ------------------------------------------------------------------------------------------------------------------ No results for input(s): CHOL, HDL, LDLCALC, TRIG, CHOLHDL, LDLDIRECT in the last 72 hours.  Lab Results  Component Value Date   HGBA1C 7.6  (H) 10/13/2015   ------------------------------------------------------------------------------------------------------------------ No results for input(s): TSH, T4TOTAL, T3FREE, THYROIDAB in the last 72 hours.  Invalid input(s): FREET3 ------------------------------------------------------------------------------------------------------------------ Recent Labs    08/03/18 0748 08/04/18 0300  FERRITIN 165 159    Coagulation profile No results for input(s): INR, PROTIME in the last 168 hours.  Recent Labs    08/04/18 0300 08/05/18 0400  DDIMER 1.53* 1.56*    Cardiac Enzymes No results for input(s): CKMB, TROPONINI, MYOGLOBIN in the last 168 hours.  Invalid input(s): CK ------------------------------------------------------------------------------------------------------------------    Component Value Date/Time   BNP 218.4 (H) 08/03/2018 6213    Micro Results Recent Results (from the past 240 hour(s))  SARS Coronavirus 2     Status: Abnormal   Collection Time: 08/05/2018 10:00 AM  Result Value Ref Range Status   SARS Coronavirus 2 DETECTED (A) NOT DETECTED Final    Comment: RESULT CALLED TO, READ BACK BY AND VERIFIED WITH: E. Pulliam RN 12:30 08/12/2018 ([email or web address]) (NOTE) SARS-CoV-2 target nucleic acids are DETECTED. The SARS-CoV-2 RNA is generally detectable in upper and lower respiratory specimens during the acute phase of infection. Positive results are indicative of active infection with SARS-CoV-2. Clinical  correlation with patient history and other diagnostic information is necessary to determine patient infection status. Positive results do  not rule out bacterial infection or co-infection with other viruses. The expected result is Not Detected. Fact Sheet for Patients: http://www.biofiredefense.com/wp-content/uploads/2020/03/BIOFIRE-COVID -19-patients.pdf Fact Sheet for Healthcare Providers: http://www.biofiredefense.com/wp-content/uploads/2020/03/BIOFIRE-COVID  -19-hcp.pdf This test is not yet approved or cleared by the [place] and  has been authorized for detection and/or diagnosis of SARS-CoV-2 by FDA under an Emergency  Use Authorization (EUA).  This EUA will remain in effect (meaning this test can be used) for the duration of  the COVID-19 declaration under Section 564(b)(1) of the Act, 21 U.S.C. section 8438825538 3(b)(1), unless the authorization is terminated or revoked sooner. Performed at [facility], [place] [address], [place], [place] [address]   Blood Culture (routine x  2)     Status: Abnormal   Collection Time: 08/04/2018 10:22 AM   Specimen: BLOOD RIGHT HAND  Result Value Ref Range Status   Specimen Description BLOOD RIGHT HAND  Final   Special Requests   Final    BOTTLES DRAWN AEROBIC AND ANAEROBIC Blood Culture adequate volume   Culture  Setup Time   Final    GRAM POSITIVE COCCI AEROBIC BOTTLE ONLY CRITICAL VALUE NOTED.  VALUE IS CONSISTENT WITH PREVIOUSLY REPORTED AND CALLED VALUE.    Culture (A)  Final    STAPHYLOCOCCUS EPIDERMIDIS SUSCEPTIBILITIES PERFORMED ON PREVIOUS CULTURE WITHIN THE LAST 5 DAYS. Performed at [facility], [place] [address], [place], [place] [address]    Report Status 08/05/2018 FINAL  Final  Urine culture     Status: None   Collection Time: 08/08/2018 10:22 AM   Specimen: Urine, Catheterized  Result Value Ref Range Status   Specimen Description URINE, CATHETERIZED  Final   Special Requests Normal  Final   Culture   Final    NO GROWTH Performed at [facility], [place] [address], [place], [place] [address]    Report Status 08/03/2018 FINAL  Final  Blood Culture (routine x 2)     Status: Abnormal   Collection Time: 08/07/2018 10:27 AM   Specimen: BLOOD LEFT HAND  Result Value Ref Range Status   Specimen Description BLOOD LEFT HAND  Final   Special Requests   Final    BOTTLES DRAWN AEROBIC AND ANAEROBIC Blood Culture adequate volume   Culture  Setup Time   Final    GRAM  POSITIVE COCCI AEROBIC BOTTLE ONLY Organism ID to follow CRITICAL RESULT CALLED TO, READ BACK BY AND VERIFIED WITH: Jene Every PharmD 10:25 08/03/18 ([email or web address]) Performed at [facility], [address], [place], [place] [address]    Culture STAPHYLOCOCCUS EPIDERMIDIS (A)  Final   Report Status 08/05/2018 FINAL  Final   Organism ID, Bacteria STAPHYLOCOCCUS EPIDERMIDIS  Final      Susceptibility   Staphylococcus epidermidis - MIC*    CIPROFLOXACIN >=8 RESISTANT Resistant     ERYTHROMYCIN >=8 RESISTANT Resistant     GENTAMICIN <=0.5 SENSITIVE Sensitive     OXACILLIN >=4 RESISTANT Resistant     TETRACYCLINE <=1 SENSITIVE Sensitive     VANCOMYCIN 2 SENSITIVE Sensitive     TRIMETH/SULFA 20 SENSITIVE Sensitive     CLINDAMYCIN RESISTANT Resistant     RIFAMPIN <=0.5 SENSITIVE Sensitive     Inducible Clindamycin POSITIVE Resistant     * STAPHYLOCOCCUS EPIDERMIDIS  Blood Culture ID Panel (Reflexed)     Status: Abnormal   Collection Time: 08/22/2018 10:27 AM  Result Value Ref Range Status   Enterococcus species NOT DETECTED NOT DETECTED Final   Listeria monocytogenes NOT DETECTED NOT DETECTED Final   Staphylococcus species DETECTED (A) NOT DETECTED Final    Comment: Methicillin (oxacillin) resistant coagulase negative staphylococcus. Possible blood culture contaminant (unless isolated from more than one blood culture draw or clinical case suggests pathogenicity). No antibiotic treatment is indicated for blood  culture contaminants. CRITICAL RESULT CALLED TO, READ BACK BY AND VERIFIED WITH: Jene Every PharmD 10:25 08/03/18 ([email or web address])    Staphylococcus aureus (BCID) NOT DETECTED NOT DETECTED Final   Methicillin resistance DETECTED (A) NOT DETECTED Final    Comment: CRITICAL RESULT CALLED TO, READ BACK BY AND VERIFIED WITH: Jene Every PharmD 10:25 08/03/18 ([email or web address])    Streptococcus species NOT DETECTED NOT DETECTED Final   Streptococcus agalactiae NOT DETECTED  NOT DETECTED Final   Streptococcus  pneumoniae NOT DETECTED NOT DETECTED Final   Streptococcus pyogenes NOT DETECTED NOT DETECTED Final   Acinetobacter baumannii NOT DETECTED NOT DETECTED Final   Enterobacteriaceae species NOT DETECTED NOT DETECTED Final   Enterobacter cloacae complex NOT DETECTED NOT DETECTED Final   Escherichia coli NOT DETECTED NOT DETECTED Final   Klebsiella oxytoca NOT DETECTED NOT DETECTED Final   Klebsiella pneumoniae NOT DETECTED NOT DETECTED Final   Proteus species NOT DETECTED NOT DETECTED Final   Serratia marcescens NOT DETECTED NOT DETECTED Final   Haemophilus influenzae NOT DETECTED NOT DETECTED Final   Neisseria meningitidis NOT DETECTED NOT DETECTED Final   Pseudomonas aeruginosa NOT DETECTED NOT DETECTED Final   Candida albicans NOT DETECTED NOT DETECTED Final   Candida glabrata NOT DETECTED NOT DETECTED Final   Candida krusei NOT DETECTED NOT DETECTED Final   Candida parapsilosis NOT DETECTED NOT DETECTED Final   Candida tropicalis NOT DETECTED NOT DETECTED Final    Comment: Performed at [facility], [place] [address], [place], [place] [address]  MRSA PCR Screening     Status: None   Collection Time: 08/08/2018 11:35 PM   Specimen: Nasopharyngeal  Result Value Ref Range Status   MRSA by PCR NEGATIVE NEGATIVE Final    Comment:        The GeneXpert MRSA Assay (FDA approved for NASAL specimens only), is one component of a comprehensive MRSA colonization surveillance program. It is not intended to diagnose MRSA infection nor to guide or monitor treatment for MRSA infections. Performed at [facility], [place] [address], [place], [place] [address]   Culture, blood (routine x 2)     Status: None (Preliminary result)   Collection Time: 08/04/18  3:10 PM   Specimen: BLOOD  Result Value Ref Range Status   Specimen Description   Final    BLOOD RIGHT ARM Performed at [place] [address] [place], [place] [address]    Special Requests    Final    BOTTLES DRAWN AEROBIC ONLY Blood Culture adequate volume Performed at [place] [address] [place], [place] [address]    Culture   Final    NO GROWTH < 12 HOURS Performed at [place] [address] [place], [place] [address]    Report Status PENDING  Incomplete  Culture, blood (routine x 2)     Status: None (Preliminary result)   Collection Time: 08/04/18  3:20 PM   Specimen: BLOOD  Result Value Ref Range Status   Specimen Description   Final    BLOOD RIGHT ARM Performed at [place] [address], [place], [place] [address]    Special Requests   Final    BOTTLES DRAWN AEROBIC ONLY Blood Culture adequate volume Performed at [place] [address], [place], [place] [address]    Culture   Final    NO GROWTH < 12 HOURS Performed at [facility] [address] [place],  [address]    Report Status PENDING  Incomplete    Radiology Reports Dg Chest Port 1 View  Result Date: 08/03/2018 CLINICAL DATA:  Encounter for respiratory failure. EXAM: PORTABLE CHEST 1 VIEW COMPARISON:  One-view chest x-ray 07/30/2018 FINDINGS: Heart is enlarged. Lung volumes are low. Progressive bilateral airspace disease is noted, left greater than right. Mild pulmonary vascular congestion is again seen. There is some fluid along the right minor fissure  and at the right base. IMPRESSION: 1. Progressive bilateral airspace disease, most prominent at the lung bases, left greater than right. 2. Right pleural effusion. Electronically Signed   By: San Morelle M.D.   On: 08/03/2018 06:05   Dg Chest Port 1 View  Result Date: 07/26/2018 CLINICAL DATA:  Altered mental status.  Hypoxia. EXAM: PORTABLE CHEST 1 VIEW COMPARISON:  One-view chest x-ray 03/26/2018 FINDINGS: The heart is enlarged. Lung volumes are low. Patchy bilateral airspace opacities are present. No significant focal consolidation is present.  There is no edema or effusion. Visualized soft tissues and bony thorax are unremarkable. IMPRESSION: 1. Stable cardiomegaly without failure. 2. Patchy bilateral airspace disease. This is concerning for atypical infection. Edema is considered less likely. 3. Decreased lung volumes. Electronically Signed   By: San Morelle M.D.   On: 08/15/2018 11:53

## 2018-08-06 ENCOUNTER — Inpatient Hospital Stay: Payer: Self-pay

## 2018-08-06 DIAGNOSIS — R0602 Shortness of breath: Secondary | ICD-10-CM

## 2018-08-06 LAB — POCT I-STAT 7, (LYTES, BLD GAS, ICA,H+H)
Acid-Base Excess: 14 mmol/L — ABNORMAL HIGH (ref 0.0–2.0)
Bicarbonate: 36.5 mmol/L — ABNORMAL HIGH (ref 20.0–28.0)
Calcium, Ion: 1.17 mmol/L (ref 1.15–1.40)
HCT: 53 % — ABNORMAL HIGH (ref 36.0–46.0)
Hemoglobin: 18 g/dL — ABNORMAL HIGH (ref 12.0–15.0)
O2 Saturation: 94 %
Patient temperature: 98.7
Potassium: 3.3 mmol/L — ABNORMAL LOW (ref 3.5–5.1)
Sodium: 136 mmol/L (ref 135–145)
TCO2: 38 mmol/L — ABNORMAL HIGH (ref 22–32)
pCO2 arterial: 36.2 mmHg (ref 32.0–48.0)
pH, Arterial: 7.612 (ref 7.350–7.450)
pO2, Arterial: 59 mmHg — ABNORMAL LOW (ref 83.0–108.0)

## 2018-08-06 LAB — CBC
HCT: 51.6 % — ABNORMAL HIGH (ref 36.0–46.0)
Hemoglobin: 16.8 g/dL — ABNORMAL HIGH (ref 12.0–15.0)
MCH: 29 pg (ref 26.0–34.0)
MCHC: 32.6 g/dL (ref 30.0–36.0)
MCV: 89.1 fL (ref 80.0–100.0)
Platelets: 175 10*3/uL (ref 150–400)
RBC: 5.79 MIL/uL — ABNORMAL HIGH (ref 3.87–5.11)
RDW: 13.9 % (ref 11.5–15.5)
WBC: 10.8 10*3/uL — ABNORMAL HIGH (ref 4.0–10.5)
nRBC: 0.5 % — ABNORMAL HIGH (ref 0.0–0.2)

## 2018-08-06 LAB — COMPREHENSIVE METABOLIC PANEL
ALT: 22 U/L (ref 0–44)
AST: 30 U/L (ref 15–41)
Albumin: 3 g/dL — ABNORMAL LOW (ref 3.5–5.0)
Alkaline Phosphatase: 59 U/L (ref 38–126)
Anion gap: 14 (ref 5–15)
BUN: 17 mg/dL (ref 8–23)
CO2: 30 mmol/L (ref 22–32)
Calcium: 9.2 mg/dL (ref 8.9–10.3)
Chloride: 93 mmol/L — ABNORMAL LOW (ref 98–111)
Creatinine, Ser: 0.76 mg/dL (ref 0.44–1.00)
GFR calc Af Amer: >60 mL/min (ref >60)
GFR calc non Af Amer: >60 mL/min (ref >60)
Glucose, Bld: 265 mg/dL — ABNORMAL HIGH (ref 70–99)
Potassium: 3.6 mmol/L (ref 3.5–5.1)
Sodium: 137 mmol/L (ref 135–145)
Total Bilirubin: 0.5 mg/dL (ref 0.3–1.2)
Total Protein: 7.8 g/dL (ref 6.5–8.1)

## 2018-08-06 LAB — C-REACTIVE PROTEIN: CRP: 6.3 mg/dL — ABNORMAL HIGH (ref <1.0)

## 2018-08-06 LAB — GLUCOSE, CAPILLARY
Glucose-Capillary: 255 mg/dL — ABNORMAL HIGH (ref 70–99)
Glucose-Capillary: 254 mg/dL — ABNORMAL HIGH (ref 70–99)
Glucose-Capillary: 243 mg/dL — ABNORMAL HIGH (ref 70–99)
Glucose-Capillary: 198 mg/dL — ABNORMAL HIGH (ref 70–99)

## 2018-08-06 LAB — LACTATE DEHYDROGENASE: LDH: 386 U/L — ABNORMAL HIGH (ref 98–192)

## 2018-08-06 LAB — FERRITIN: Ferritin: 231 ng/mL (ref 11–307)

## 2018-08-06 LAB — D-DIMER, QUANTITATIVE: D-Dimer, Quant: 1.04 ug/mL-FEU — ABNORMAL HIGH (ref 0.00–0.50)

## 2018-08-06 LAB — LACTIC ACID, PLASMA: Lactic Acid, Venous: 2.2 mmol/L (ref 0.5–1.9)

## 2018-08-06 MED ORDER — TORSEMIDE 20 MG PO TABS
20.0000 mg | ORAL_TABLET | Freq: Every day | ORAL | Status: DC
  Filled 2018-08-06: qty 1

## 2018-08-06 MED ORDER — MORPHINE SULFATE (PF) 2 MG/ML IV SOLN
1.0000 mg | INTRAVENOUS | Status: DC | PRN
  Filled 2018-08-06: qty 1

## 2018-08-06 MED ORDER — ALBUTEROL SULFATE HFA 108 (90 BASE) MCG/ACT IN AERS
2.0000 | INHALATION_SPRAY | Freq: Four times a day (QID) | RESPIRATORY_TRACT | Status: DC
  Administered 2018-08-06: 08:00:00 2 via RESPIRATORY_TRACT
  Filled 2018-08-06: qty 6.7

## 2018-08-06 MED ORDER — FUROSEMIDE 10 MG/ML IJ SOLN
40.0000 mg | Freq: Once | INTRAMUSCULAR | Status: AC
  Administered 2018-08-06: 40 mg via INTRAVENOUS
  Filled 2018-08-06: qty 4

## 2018-08-06 MED ORDER — AMLODIPINE BESYLATE 5 MG PO TABS
10.0000 mg | ORAL_TABLET | Freq: Every day | ORAL | Status: DC
  Administered 2018-08-06: 10 mg via ORAL
  Filled 2018-08-06: qty 2

## 2018-08-06 MED ORDER — MORPHINE SULFATE (PF) 2 MG/ML IV SOLN: 1.0000 mg | INTRAVENOUS | Status: DC | PRN

## 2018-08-06 MED ORDER — MORPHINE SULFATE (CONCENTRATE) 10 MG/0.5ML PO SOLN
5.0000 mg | ORAL | Status: DC | PRN
  Administered 2018-08-06 – 2018-08-07 (×7): 5 mg via ORAL
  Filled 2018-08-06 (×7): qty 0.5

## 2018-08-06 MED ORDER — TORSEMIDE 20 MG PO TABS: 20.0000 mg | ORAL_TABLET | Freq: Every day | ORAL | Status: DC

## 2018-08-06 MED ORDER — METOPROLOL TARTRATE 25 MG PO TABS
50.0000 mg | ORAL_TABLET | Freq: Two times a day (BID) | ORAL | Status: DC
  Administered 2018-08-06: 50 mg via ORAL
  Filled 2018-08-06 (×2): qty 2

## 2018-08-06 MED ORDER — NITROGLYCERIN 0.4 MG SL SUBL
SUBLINGUAL_TABLET | SUBLINGUAL | Status: AC
  Filled 2018-08-06: qty 1

## 2018-08-06 NOTE — Significant Event (Addendum)
Called to see patient for reported change in mental status  On arrival, patient awake, alert, nontoxic, odd affect. Denied chest pain Has been hypertensive  SBP 180s, heart rate 110-120, sinus with ectopy noted. SPO2 stable Respiratory rate labile, 20-40 Lungs clear to auscultation bilaterally, cardiovascular regular rate and rhythm.  No significant lower extremity edema noted. Eyes: Pupils equal, round, reactive to light ENT: Grossly unremarkable Abdomen: Soft, nontender, nondistended Skin: Grossly unremarkable Neurologic: Moves arms and legs equally to command with grossly normal tone and strength, no focal weakness noted.  Cranial nerves appear grossly intact.  Extraocular movements intact. Psychiatric: Affect odd, speech slow.  Was able to speak several appropriate sentences, "I do not want any water".  Chest x-ray earlier in shift showed mildly improved lung aeration with a decrease in airspace opacities at the lung bases.  No new abnormalities.  EKG independently reviewed shows sinus tachycardia with frequent supraventricular beats.  Nonspecific ST changes.   Had usual medications this evening including benztropine, fluphenazine.  Impression: No evidence of acute decompensation, acute neurologic event or ACS.  Continue to monitor clinically.  Will check basic labs, lactic acid.  Hypertension appears stable at this time.  Murray Hodgkins, MD [facility] [phone]  Time [phone]

## 2018-08-06 NOTE — Progress Notes (Signed)
Spoke with Jorene Guest, RN concerning PICC order. She contacted the primary, who stated a Midline would be sufficient. Plans are to place a Midline sometime this morning.

## 2018-08-06 NOTE — Progress Notes (Signed)
Nurse entered room to find patient unresponsive to voice. Blood pressure elevated. Withdrawal from pain in all extremities. Right eye closed, unable to open. Right side of mouth drooped. Tongue to right side. Unable to follow commands. Second nurse Jill Alexanders, and doctor notified of changes. 2nd nurse tracy evaluated using NIH. With time, patient began to mumble incomprehensible words. Opened right eye small degree. Started following simple commands. Doctor arrived to floor. Patient continues to stare in distance. Unable to focus for more than short periods. While in doctors presence, patient regained some speech. Able to spontaneously move all extremities. Verbal responses delayed. Relayed recent labs to doctor within room. Stayed with patient after doctors exit to monitor progress.

## 2018-08-06 NOTE — Progress Notes (Signed)
PROGRESS NOTE                                                                                                                                                                                                             Patient Demographics:    [name] [name], is a 73 y.o. female, DOB - 30-Dec-1945, RWE:[id number]  Outpatient Primary MD for the [facility] is [facility] [place]    LOS - 4  Chief Complaint  Patient presents with   Fever       Brief Narrative: Patient is a 73 y.o. female with history of COPD, depression, DM-2, chronic diastolic heart failure, schizophrenia, dementia-presented with confusion-was found to have acute on chronic hypercarbic respiratory failure and COVID-19 viral pneumonia.  Due to rapid improvement-patient was able to avoid intubation.  See below for further details.   Subjective:   Somewhat confused-really does not follow commands-has tachypneic spells.  Moves all 4 extremities.   Assessment  & Plan :   Acute on chronic hypercapnic and hypoxemic respiratory failure: Hypercapnia resolved with steroids and bronchodilators, hypoxemia is secondary to COVID-19 pneumonia.  Given her overall poor state of health-really not a candidate for any further escalation in care.  Overnight she seems to have worsened-has developed worsening encephalopathy, has tachypneic spells.  Hold psychotropic medications-monitor for a few hours.  If she continues to have these episodes-she may need to be transition to full comfort measures.  Further work-up in this very frail debilitated patient will not likely change outcome.  COPD exacerbation: Likely causing hypercapnia-probably worsened by psychotropic medications.  Had improved-but now has developed worsening encephalopathy-continue bronchodilators and steroids.    COVID-19 viral pneumonia: Bilateral infiltrates consistent with viral pneumonia-since  procalcitonin was not elevated-all anti-biotics were discontinued on 6/11.  Remains on steroids-due to very poor overall state of health-and the high likelihood that the patient's life expectancy is less than 6 months-not a candidate for Remdesivir/Actemra and aggressive care.  DNR in place.   COVID-19 Labs:  Recent Labs    08/04/18 0300 08/05/18 0400 08/05/18 0850 08/06/18 0815  DDIMER 1.53* 1.56*  --  1.04*  FERRITIN 159  --  201 231  LDH 289*  --  410* 386*  CRP 1.1*  --  0.8 6.3*    Lab Results  Component Value Date   SARSCOV2NAA DETECTED (A)  08/18/2018     COVID-19 Medications: 6/10>> Solu-Medrol  Gram + cocci bacteremia: likely coag neg staph-probably contaminant-repeat cultures on 6/12-negative so far-continue to follow.  No need for initiating antimicrobial therapy at this time.  Acute metabolic Encephalopathy: Secondary to hypercarbia-and had resolved.  This morning she again appears to be very encephalopathic-repeat ABG without any significant hypercarbia.  Continue full supportive care.    Chronic diastolic heart failure: No evidence of gross volume overload-but given tachypneic spells-we will give 1 dose of Lasix this morning to see if any clinical improvement.  Insulin-dependent DM-2: CBGs relatively stable-side-but given tenuous clinical state-maintain some mild hyper glycemia at this point.  Continue Lantus 11 units nightly and SSI.  Follow and adjust accordingly.    Hypertension: Uncontrolled-increase amlodipine to 10 mg, increase metoprolol to 50 mg twice daily.  Follow and optimize.    Schizophrenia: Hold Depakote and fluphenazine today-given worsening encephalopathy.  Continue Depakote and fluphenazine.  Hypothyroidism: Continue levothyroxine  Recent UTI: Has completed a course of antimicrobial therapy-repeat blood culture on 6/10--no need for any further antimicrobial therapy at this point  Dementia with risk for delirium: Supportive care at this  point-resume all of her schizophrenic medications.  History of right renal mass-likely renal cell carcinoma: Supportive care-follow-up with her outpatient physicians.  Debility/deconditioning: Very frail and debilitated-worsening at this point due to acute illness on top of frailty/debility.  Improves-may need a repeat PT eval.  Palliative care: DNR in place-multiple medical comorbidities-very frail appearing-clearly not a candidate for any further escalation in care.  Seems to be much worse compared to yesterday-Long discussion with patient's son here in [place] (Zeb Comfort asked me to call his brother [name] as well (lives in [place]).  We will provide supportive care-and see how she does.  If patient continues to deteriorate-family aware that we have really no other option but to transition to full comfort measures.  ABG:    Component Value Date/Time   PHART 7.612 (HH) 08/06/2018 0902   PCO2ART 36.2 08/06/2018 0902   PO2ART 59.0 (L) 08/06/2018 0902   HCO3 36.5 (H) 08/06/2018 0902   TCO2 38 (H) 08/06/2018 0902   O2SAT 94.0 08/06/2018 0902    Condition - Guarded  Family Communication  : Both sons [name] and [place] over the phone  Code Status :  DNR  Diet :  Diet Order            Diet heart healthy/carb modified Room service appropriate? Yes; Fluid consistency: Thin  Diet effective now               Disposition Plan  : Remain inpatient-not stable for discharge.  Consults  :  PCCM  Procedures  :  None  DVT Prophylaxis  :  Lovenox  Lab Results  Component Value Date   PLT 175 08/06/2018    Inpatient Medications  Scheduled Meds:  albuterol  2 puff Inhalation QID   amLODipine  10 mg Oral Daily   aspirin  81 mg Per Tube Daily   atorvastatin  20 mg Per Tube q1800   benztropine  0.5 mg Oral BID   divalproex  500 mg Oral TID   enoxaparin (LOVENOX) injection  40 mg Subcutaneous Q24H   fentaNYL (SUBLIMAZE) injection  25 mcg Intravenous Once   fluPHENAZine  5 mg  Oral BID   insulin aspart  0-9 Units Subcutaneous TID WC   insulin glargine  11 Units Subcutaneous QHS   levothyroxine  75 mcg Oral Daily   metoprolol tartrate  50 mg Oral BID   mometasone-formoterol  2 puff Inhalation BID   nitroGLYCERIN       pantoprazole  40 mg Oral Daily   predniSONE  40 mg Oral Q breakfast   [START ON 08/07/2018] torsemide  20 mg Oral Daily   umeclidinium bromide  1 puff Inhalation Daily   Continuous Infusions:  sodium chloride Stopped (08/03/18 1729)   PRN Meds:.fentaNYL, fluPHENAZine, hydrALAZINE  Antibiotics  :    Anti-infectives (From admission, onward)   Start     Dose/Rate Route Frequency Ordered Stop   08/03/18 1700  vancomycin (VANCOCIN) 1,250 mg in sodium chloride 0.9 % 250 mL IVPB  Status:  Discontinued     1,250 mg 166.7 mL/hr over 90 Minutes Intravenous Every 24 hours 08/17/2018 1609 08/03/18 1110   08/20/2018 1530  vancomycin (VANCOCIN) 2,000 mg in sodium chloride 0.9 % 500 mL IVPB     2,000 mg 250 mL/hr over 120 Minutes Intravenous  Once 08/12/2018 1523 08/16/2018 1912   08/06/2018 1515  ceFEPIme (MAXIPIME) 2 g in sodium chloride 0.9 % 100 mL IVPB  Status:  Discontinued     2 g 200 mL/hr over 30 Minutes Intravenous Every 8 hours 08/06/2018 1513 08/03/18 1110   08/06/2018 1245  cefTRIAXone (ROCEPHIN) 1 g in sodium chloride 0.9 % 100 mL IVPB     1 g 200 mL/hr over 30 Minutes Intravenous  Once 08/13/2018 1236 08/10/2018 1420   07/29/2018 1245  azithromycin (ZITHROMAX) 500 mg in sodium chloride 0.9 % 250 mL IVPB     500 mg 250 mL/hr over 60 Minutes Intravenous  Once 08/14/2018 1236 07/29/2018 1809       Time Spent in minutes  35    Oren Binet M.D on 08/06/2018 at 9:46 AM  To page go to [email or web address] - use universal password  [facility] -  Office  [phone]  See all Orders from today for further details   Admit date - 08/22/2018    4    Objective:   Vitals:   08/06/18 0630 08/06/18 0700 08/06/18 0715 08/06/18 0815  BP: (!)  159/106 (!) 187/103 (!) 193/100 (!) 157/67  Pulse:      Resp: (!) 25 (!) 23 11 (!) 37  Temp:    98.7 F (37.1 C)  TempSrc:    Axillary  SpO2: 90% 97% (!) 85% 95%  Weight:      Height:        Wt Readings from Last 3 Encounters:  08/06/18 116.8 kg  07/24/18 120 kg  04/03/18 120.4 kg     Intake/Output Summary (Last 24 hours) at 08/06/2018 0946 Last data filed at 08/06/2018 4492 Gross per 24 hour  Intake 300 ml  Output 900 ml  Net -600 ml     Physical Exam General appearance: Confused-lethargic-has tachypneic spells at times. Eyes:no scleral icterus. HEENT: Atraumatic and Normocephalic Neck: supple, no JVD. Resp:Good air entry bilaterally, clear to auscultation anteriorly CVS: S1 S2 regular, slightly tachycardic GI: Bowel sounds present, Non tender and not distended with no gaurding, rigidity or rebound. Extremities: B/L Lower Ext shows no edema, both legs are warm to touch Neurology: Difficult exam but appears to be moving all 4 extremities Musculoskeletal:No digital cyanosis Skin:No Rash, warm and dry Wounds:N/A   Data Review:    CBC Recent Labs  Lab 08/20/2018 1055  08/03/18 0748 08/04/18 0300 08/05/18 0400 08/06/18 0815 08/06/18 0902  WBC 6.5  --  4.9 5.0 14.4* 10.8*  --   HGB 13.4   < >  12.6 12.9 15.7* 16.8* 18.0*  HCT 43.0   < > 42.7 42.2 49.7* 51.6* 53.0*  PLT 155  --  145* 158 207 175  --   MCV 94.9  --  96.4 94.8 91.9 89.1  --   MCH 29.6  --  28.4 29.0 29.0 29.0  --   MCHC 31.2  --  29.5* 30.6 31.6 32.6  --   RDW 14.6  --  14.6 14.0 13.9 13.9  --   LYMPHSABS 1.2  --   --   --   --   --   --   MONOABS 1.0  --   --   --   --   --   --   EOSABS 0.0  --   --   --   --   --   --   BASOSABS 0.0  --   --   --   --   --   --    < > = values in this interval not displayed.    Chemistries  Recent Labs  Lab 08/12/2018 1055  08/03/18 0748 08/04/18 0300 08/05/18 0850 08/06/18 0815 08/06/18 0902  NA 139   < > 141 139 140 137 136  K 4.1   < > 4.6 4.6 4.3  3.6 3.3*  CL 103  --  106 104 96* 93*  --   CO2 26  --  26 26 32 30  --   GLUCOSE 105*  --  120* 210* 222* 265*  --   BUN 13  --  15 15 17 17   --   CREATININE 1.07*  --  0.78 0.82 0.81 0.76  --   CALCIUM 8.1*  --  7.5* 7.7* 9.1 9.2  --   MG  --   --  1.9  --   --   --   --   AST 35  --  30 29 30 30   --   ALT 13  --  15 14 19 22   --   ALKPHOS 42  --  41 41 57 59  --   BILITOT 0.5  --  0.2* 0.1* <0.1* 0.5  --    < > = values in this interval not displayed.   ------------------------------------------------------------------------------------------------------------------ No results for input(s): CHOL, HDL, LDLCALC, TRIG, CHOLHDL, LDLDIRECT in the last 72 hours.  Lab Results  Component Value Date   HGBA1C 7.6 (H) 10/13/2015   ------------------------------------------------------------------------------------------------------------------ No results for input(s): TSH, T4TOTAL, T3FREE, THYROIDAB in the last 72 hours.  Invalid input(s): FREET3 ------------------------------------------------------------------------------------------------------------------ Recent Labs    08/05/18 0850 08/06/18 0815  FERRITIN 201 231    Coagulation profile No results for input(s): INR, PROTIME in the last 168 hours.  Recent Labs    08/05/18 0400 08/06/18 0815  DDIMER 1.56* 1.04*    Cardiac Enzymes No results for input(s): CKMB, TROPONINI, MYOGLOBIN in the last 168 hours.  Invalid input(s): CK ------------------------------------------------------------------------------------------------------------------    Component Value Date/Time   BNP 218.4 (H) 08/03/2018 9147    Micro Results Recent Results (from the past 240 hour(s))  SARS Coronavirus 2     Status: Abnormal   Collection Time: 08/22/2018 10:00 AM  Result Value Ref Range Status   SARS Coronavirus 2 DETECTED (A) NOT DETECTED Final    Comment: RESULT CALLED TO, READ BACK BY AND VERIFIED WITH: E. Pulliam RN 12:30 08/20/2018  ([email or web address]) (NOTE) SARS-CoV-2 target nucleic acids are DETECTED. The SARS-CoV-2 RNA is generally detectable in upper and lower respiratory specimens  during the acute phase of infection. Positive results are indicative of active infection with SARS-CoV-2. Clinical  correlation with patient history and other diagnostic information is necessary to determine patient infection status. Positive results do  not rule out bacterial infection or co-infection with other viruses. The expected result is Not Detected. Fact Sheet for Patients: http://www.biofiredefense.com/wp-content/uploads/2020/03/BIOFIRE-COVID -19-patients.pdf Fact Sheet for Healthcare Providers: http://www.biofiredefense.com/wp-content/uploads/2020/03/BIOFIRE-COVID -19-hcp.pdf This test is not yet approved or cleared by the [place] and  has been authorized for detection and/or diagnosis of SARS-CoV-2 by FDA under an Emergency  Use Authorization (EUA).  This EUA will remain in effect (meaning this test can be used) for the duration of  the COVID-19 declaration under Section 564(b)(1) of the Act, 21 U.S.C. section [phone] 3(b)(1), unless the authorization is terminated or revoked sooner. Performed at [facility], [place] [address], [place], [place] [address]   Blood Culture (routine x 2)     Status: Abnormal   Collection Time: 08/16/2018 10:22 AM   Specimen: BLOOD RIGHT HAND  Result Value Ref Range Status   Specimen Description BLOOD RIGHT HAND  Final   Special Requests   Final    BOTTLES DRAWN AEROBIC AND ANAEROBIC Blood Culture adequate volume   Culture  Setup Time   Final    GRAM POSITIVE COCCI AEROBIC BOTTLE ONLY CRITICAL VALUE NOTED.  VALUE IS CONSISTENT WITH PREVIOUSLY REPORTED AND CALLED VALUE.    Culture (A)  Final    STAPHYLOCOCCUS EPIDERMIDIS SUSCEPTIBILITIES PERFORMED ON PREVIOUS CULTURE WITHIN THE LAST 5 DAYS. Performed at [facility], [place] [address], [place], [place] [address]    Report  Status 08/05/2018 FINAL  Final  Urine culture     Status: None   Collection Time: 07/31/2018 10:22 AM   Specimen: Urine, Catheterized  Result Value Ref Range Status   Specimen Description URINE, CATHETERIZED  Final   Special Requests Normal  Final   Culture   Final    NO GROWTH Performed at [facility], [place] [address] [place], [place] [address]    Report Status 08/03/2018 FINAL  Final  Blood Culture (routine x 2)     Status: Abnormal   Collection Time: 08/10/2018 10:27 AM   Specimen: BLOOD LEFT HAND  Result Value Ref Range Status   Specimen Description BLOOD LEFT HAND  Final   Special Requests   Final    BOTTLES DRAWN AEROBIC AND ANAEROBIC Blood Culture adequate volume   Culture  Setup Time   Final    GRAM POSITIVE COCCI AEROBIC BOTTLE ONLY Organism ID to follow CRITICAL RESULT CALLED TO, READ BACK BY AND VERIFIED WITH: Jene Every PharmD 10:25 08/03/18 ([email or web address]) Performed at [facility], [address], [place], [place] [address]    Culture STAPHYLOCOCCUS EPIDERMIDIS (A)  Final   Report Status 08/05/2018 FINAL  Final   Organism ID, Bacteria STAPHYLOCOCCUS EPIDERMIDIS  Final      Susceptibility   Staphylococcus epidermidis - MIC*    CIPROFLOXACIN >=8 RESISTANT Resistant     ERYTHROMYCIN >=8 RESISTANT Resistant     GENTAMICIN <=0.5 SENSITIVE Sensitive     OXACILLIN >=4 RESISTANT Resistant     TETRACYCLINE <=1 SENSITIVE Sensitive     VANCOMYCIN 2 SENSITIVE Sensitive     TRIMETH/SULFA 20 SENSITIVE Sensitive     CLINDAMYCIN RESISTANT Resistant     RIFAMPIN <=0.5 SENSITIVE Sensitive     Inducible Clindamycin POSITIVE Resistant     * STAPHYLOCOCCUS EPIDERMIDIS  Blood Culture ID Panel (  Reflexed)     Status: Abnormal   Collection Time: 08/07/2018 10:27 AM  Result Value Ref Range Status   Enterococcus species NOT DETECTED NOT DETECTED Final   Listeria monocytogenes NOT DETECTED NOT DETECTED Final   Staphylococcus species DETECTED (A) NOT DETECTED Final    Comment:  Methicillin (oxacillin) resistant coagulase negative staphylococcus. Possible blood culture contaminant (unless isolated from more than one blood culture draw or clinical case suggests pathogenicity). No antibiotic treatment is indicated for blood  culture contaminants. CRITICAL RESULT CALLED TO, READ BACK BY AND VERIFIED WITH: Jene Every PharmD 10:25 08/03/18 ([email or web address])    Staphylococcus aureus (BCID) NOT DETECTED NOT DETECTED Final   Methicillin resistance DETECTED (A) NOT DETECTED Final    Comment: CRITICAL RESULT CALLED TO, READ BACK BY AND VERIFIED WITH: Jene Every PharmD 10:25 08/03/18 ([email or web address])    Streptococcus species NOT DETECTED NOT DETECTED Final   Streptococcus agalactiae NOT DETECTED NOT DETECTED Final   Streptococcus pneumoniae NOT DETECTED NOT DETECTED Final   Streptococcus pyogenes NOT DETECTED NOT DETECTED Final   Acinetobacter baumannii NOT DETECTED NOT DETECTED Final   Enterobacteriaceae species NOT DETECTED NOT DETECTED Final   Enterobacter cloacae complex NOT DETECTED NOT DETECTED Final   Escherichia coli NOT DETECTED NOT DETECTED Final   Klebsiella oxytoca NOT DETECTED NOT DETECTED Final   Klebsiella pneumoniae NOT DETECTED NOT DETECTED Final   Proteus species NOT DETECTED NOT DETECTED Final   Serratia marcescens NOT DETECTED NOT DETECTED Final   Haemophilus influenzae NOT DETECTED NOT DETECTED Final   Neisseria meningitidis NOT DETECTED NOT DETECTED Final   Pseudomonas aeruginosa NOT DETECTED NOT DETECTED Final   Candida albicans NOT DETECTED NOT DETECTED Final   Candida glabrata NOT DETECTED NOT DETECTED Final   Candida krusei NOT DETECTED NOT DETECTED Final   Candida parapsilosis NOT DETECTED NOT DETECTED Final   Candida tropicalis NOT DETECTED NOT DETECTED Final    Comment: Performed at [facility], [place]. [address], [place], [place] [address]  MRSA PCR Screening     Status: None   Collection Time: 08/16/2018 11:35 PM   Specimen: Nasopharyngeal  Result Value Ref  Range Status   MRSA by PCR NEGATIVE NEGATIVE Final    Comment:        The GeneXpert MRSA Assay (FDA approved for NASAL specimens only), is one component of a comprehensive MRSA colonization surveillance program. It is not intended to diagnose MRSA infection nor to guide or monitor treatment for MRSA infections. Performed at [facility], [place] [address], [place], [place] [address]   Culture, blood (routine x 2)     Status: None (Preliminary result)   Collection Time: 08/04/18  3:10 PM   Specimen: BLOOD  Result Value Ref Range Status   Specimen Description   Final    BLOOD RIGHT ARM Performed at [facility] [address] [place], [place] [address]    Special Requests   Final    BOTTLES DRAWN AEROBIC ONLY Blood Culture adequate volume Performed at [facility] [address], [place], [place] [address]    Culture   Final    NO GROWTH 2 DAYS Performed at [facility] [address], [place], [place] [address]    Report Status PENDING  Incomplete  Culture, blood (routine x 2)     Status: None (Preliminary result)   Collection Time: 08/04/18  3:20 PM   Specimen: BLOOD  Result Value Ref Range Status   Specimen Description  Final    BLOOD RIGHT ARM Performed at [facility] [address], [place], [place] [address]    Special Requests   Final    BOTTLES DRAWN AEROBIC ONLY Blood Culture adequate volume Performed at [facility] [address], [place], [place] [address]    Culture   Final    NO GROWTH 2 DAYS Performed at [facility] [address], [place], [place] [address]    Report Status PENDING  Incomplete    Radiology Reports Dg Chest Port 1 View  Result Date: 08/05/2018 CLINICAL DATA:  Shortness of breath. EXAM: PORTABLE CHEST 1 VIEW COMPARISON:  08/03/2018 and older studies. FINDINGS: Hazy bilateral airspace opacities are again noted, which appear  mildly improved at the bases, stable in the right upper lobe. No new lung abnormalities. No pleural effusion or pneumothorax. IMPRESSION: 1. Mildly improved lung aeration with a decrease in airspace opacities at the lung bases. No new abnormalities. Electronically Signed   By: Lajean Manes M.D.   On: 08/05/2018 22:30   Dg Chest Port 1 View  Result Date: 08/03/2018 CLINICAL DATA:  Encounter for respiratory failure. EXAM: PORTABLE CHEST 1 VIEW COMPARISON:  One-view chest x-ray 08/03/2018 FINDINGS: Heart is enlarged. Lung volumes are low. Progressive bilateral airspace disease is noted, left greater than right. Mild pulmonary vascular congestion is again seen. There is some fluid along the right minor fissure and at the right base. IMPRESSION: 1. Progressive bilateral airspace disease, most prominent at the lung bases, left greater than right. 2. Right pleural effusion. Electronically Signed   By: San Morelle M.D.   On: 08/03/2018 06:05   Dg Chest Port 1 View  Result Date: 07/28/2018 CLINICAL DATA:  Altered mental status.  Hypoxia. EXAM: PORTABLE CHEST 1 VIEW COMPARISON:  One-view chest x-ray 03/26/2018 FINDINGS: The heart is enlarged. Lung volumes are low. Patchy bilateral airspace opacities are present. No significant focal consolidation is present. There is no edema or effusion. Visualized soft tissues and bony thorax are unremarkable. IMPRESSION: 1. Stable cardiomegaly without failure. 2. Patchy bilateral airspace disease. This is concerning for atypical infection. Edema is considered less likely. 3. Decreased lung volumes. Electronically Signed   By: San Morelle M.D.   On: 07/29/2018 11:53   [place] Ekg Site Rite  Result Date: 08/06/2018 If Site Rite image not attached, placement could not be confirmed due to current cardiac rhythm.

## 2018-08-06 NOTE — Progress Notes (Addendum)
Asked by CN to evaluate the patient.  Upon entering patient was no responsive and lethargic.  Patient wasn't able to perform an NIH mini exam.  Patient's right eye unable to open and move left arm. Patient finally came around garbled speech.  Patient had a fixed gaze at times.  Patient eventually after several minutes started speaking with delayed responses.  Patient blood sugar and temperature were checked with in a reasonable range.  But her blood pressures were still elevated, confused and white blood cells were elevated from previous days. Patient possibly septic with a UTI upon admission per the patient's RN.  MD in room elevating at this time.

## 2018-08-06 NOTE — Progress Notes (Signed)
Orders received for PICC placement. Patient receiving some prn intravenous medications. Spoke with Jorene Guest, RN. It was agreed that a midline/extended dwell would suffice. Using [place], both arms assessed. Patient resistant, very slow to follow commands, and unable to extend either arm. No cephalic or basilic vein viewed.  Brachial veins too small and deep to safely access and place either PICC or Midline. Those brachial veins viewed would not be able to maintain a 4 or 5 Fr. Catheter safely and would occupy more that 80% of vessels viewed. Jorene Guest, RN informed and to contact primary to determine next step.

## 2018-08-06 NOTE — Significant Event (Signed)
Chart reviewed, interval events noted, care discussed with Dr. Sloan Leiter.  Patient has continued to worsen and he discussed this with both sons earlier today, plan was to monitor the patient, and if condition worsen, his recommendation was to proceed with comfort care.  Discussed with RN, the patient has been worsening this evening and is unresponsive now.  Tachypneic.  I discussed by telephone with son [name] and son [name].  We discussed her worsening condition, revisited Dr. Nena Alexander discussion and recommendations with them earlier today, and both sons were agreeable to proceed with full comfort care, stopping all medications not directed at comfort.  I shared with both of them that she may pass this evening and they expressed understanding.  Murray Hodgkins, MD [facility] [phone]

## 2018-08-06 NOTE — Plan of Care (Signed)
  Problem: Education: Goal: Knowledge of General Education information will improve Description: Including pain rating scale, medication(s)/side effects and non-pharmacologic comfort measures Outcome: Progressing   Problem: Clinical Measurements: Goal: Ability to maintain clinical measurements within normal limits will improve Outcome: Progressing Goal: Respiratory complications will improve Outcome: Progressing   Problem: Activity: Goal: Risk for activity intolerance will decrease Outcome: Progressing   Problem: Nutrition: Goal: Adequate nutrition will be maintained Outcome: Progressing   Problem: Coping: Goal: Level of anxiety will decrease Outcome: Progressing   Problem: Pain Managment: Goal: General experience of comfort will improve Outcome: Progressing   Problem: Safety: Goal: Ability to remain free from injury will improve Outcome: Progressing   Problem: Skin Integrity: Goal: Risk for impaired skin integrity will decrease Outcome: Progressing   Problem: Education: Goal: Knowledge of risk factors and measures for prevention of condition will improve Outcome: Progressing   Problem: Coping: Goal: Psychosocial and spiritual needs will be supported Outcome: Progressing   Problem: Respiratory: Goal: Will maintain a patent airway Outcome: Progressing Goal: Complications related to the disease process, condition or treatment will be avoided or minimized Outcome: Progressing

## 2018-08-06 NOTE — Progress Notes (Signed)
CRITICAL VALUE ALERT  Critical Value:  Lactic acid 2.2  Date & Time Notied:  08/06/2018 0919  Provider Notified: C. Gherghe MD  Orders Received/Actions taken:

## 2018-08-06 NOTE — Progress Notes (Signed)
Dr notified of change in mental status. Assessed by dr on floor.

## 2018-08-06 NOTE — Progress Notes (Signed)
CRITICAL VALUE ALERT  Critical Value:  Lactic Acid 2.2   Date & Time Notied: 08/06/2018 917/08/05/2020 9:28  Provider Notified:  Nena Alexander  Orders Received/Actions taken: Aw[place] orders

## 2018-08-07 LAB — GLUCOSE, CAPILLARY: Glucose-Capillary: 218 mg/dL — ABNORMAL HIGH (ref 70–99)

## 2018-08-07 MED ORDER — MORPHINE 100MG IN NS 100ML (1MG/ML) PREMIX INFUSION
1.0000 mg/h | INTRAVENOUS | Status: DC
  Administered 2018-08-07: 1 mg/h via INTRAVENOUS
  Administered 2018-08-08 (×2): 5 mg/h via INTRAVENOUS
  Filled 2018-08-07 (×3): qty 100

## 2018-08-07 MED ORDER — LORAZEPAM 0.5 MG PO TABS: 1.0000 mg | ORAL_TABLET | ORAL | Status: DC | PRN

## 2018-08-07 MED ORDER — HALOPERIDOL LACTATE 2 MG/ML PO CONC: 0.5000 mg | ORAL | Status: DC | PRN

## 2018-08-07 MED ORDER — ALBUTEROL SULFATE HFA 108 (90 BASE) MCG/ACT IN AERS: 2.0000 | INHALATION_SPRAY | RESPIRATORY_TRACT | Status: DC | PRN

## 2018-08-07 MED ORDER — HALOPERIDOL 0.5 MG PO TABS: 0.5000 mg | ORAL_TABLET | ORAL | Status: DC | PRN

## 2018-08-07 MED ORDER — LORAZEPAM 2 MG/ML IJ SOLN: 1.0000 mg | INTRAMUSCULAR | Status: DC | PRN

## 2018-08-07 MED ORDER — HALOPERIDOL LACTATE 5 MG/ML IJ SOLN: 0.5000 mg | INTRAMUSCULAR | Status: DC | PRN

## 2018-08-07 MED ORDER — ATROPINE SULFATE 1 % OP SOLN
4.0000 [drp] | OPHTHALMIC | Status: DC | PRN
  Filled 2018-08-07: qty 2

## 2018-08-07 MED ORDER — LORAZEPAM 2 MG/ML PO CONC: 1.0000 mg | ORAL | Status: DC | PRN

## 2018-08-07 NOTE — Progress Notes (Addendum)
PROGRESS NOTE                                                                                                                                                                                                             Patient Demographics:    [name] [name], is a 73 y.o. female, DOB - 12-18-1945, QMV:[id number]  Outpatient Primary MD for the patient is Center, Daphne    LOS - 5  Chief Complaint  Patient presents with  . Fever       Brief Narrative: Patient is a 73 y.o. female with history of COPD, depression, DM-2, chronic diastolic heart failure, schizophrenia, dementia-presented with confusion-was found to have acute on chronic hypercarbic respiratory failure and COVID-19 viral pneumonia.  Due to rapid improvement-patient was able to avoid intubation.    Unfortunately after a few days of improvement-patient started to deteriorate again on 6/14-after discussion with family she was transitioned to full comfort measures.  See below for further details.   Subjective:   Barely responsive-continues to have some tachypneic spells.  Overall appears comfortable   Assessment  & Plan :   Acute on chronic hypercapnic and hypoxemic respiratory failure: Hypercapnia resolved with steroids and bronchodilators-likely secondary to COPD exacerbation use of psychotropic medications, hypoxemia secondary to COVID-19 pneumonia.  Initially improved with supportive care but on 6/14-started deteriorating-now transition to full comfort care.  COPD exacerbation: Likely causing hypercapnia-probably worsened by psychotropic medications.  Had usually improved-but now has developed worsening encephalopathy-and has been subsequently transitioned to full comfort measures.    COVID-19 viral pneumonia: Bilateral infiltrates consistent with viral pneumonia-since procalcitonin was not elevated-all anti-biotics were discontinued on 6/11.   Was on steroids-but since clinically deteriorated and has now been transitioned to comfort measures-no longer on any form of steroids.  Due to very poor overall state of health-and the high likelihood that the patient's life expectancy is less than 6 months-is deemed not to be a candidate for Remdesivir/Actemra and aggressive care.     COVID-19 Labs:  Recent Labs    08/05/18 0400 08/05/18 0850 08/06/18 0815  DDIMER 1.56*  --  1.04*  FERRITIN  --  201 231  LDH  --  410* 386*  CRP  --  0.8 6.3*    Lab Results  Component Value Date   SARSCOV2NAA DETECTED (A) 08/15/2018  COVID-19 Medications: 6/10>> Solu-Medrol  Gram + cocci bacteremia: likely coag neg staph-probably contaminant-repeat cultures on 6/12-negative so far-continue to follow.  No need for initiating antimicrobial therapy at this time patient on comfort measures.  Acute metabolic Encephalopathy: Secondary to hypercarbia-visually improved but then worsened with worsening encephalopathy.  Currently on full comfort measures.  Chronic diastolic heart failure: No evidence of gross volume overload-comfort measures in place  Insulin-dependent DM-2: All insulin discontinued as comfort measures in place  Hypertension: All antihypertensive medications discontinued as comfort measures in place  Schizophrenia: All psychotropic medications discontinued as comfort measures in place  Hypothyroidism: No longer on levothyroxine-as comfort measures in placeRecent UTI: Has completed a course of antimicrobial therapy-repeat blood culture on 6/10--no need for any further antimicrobial therapy at this point  Dementia with risk for delirium: Supportive care   History of right renal mass-likely renal cell carcinoma:   Debility/deconditioning: Very frail and debilitated-worsening at this point due to acute illness on top of frailty/debility.    Palliative care: DNR in place-multiple medical comorbidities as outlined above-initially  improved but has now rapidly deteriorated over the past few days.  After extensive discussion with the patient's son-she is now been transitioned to full comfort measures.  Due to tachypneic spells-we will start morphine infusion-continue with as needed morphine as needed.    ABG:    Component Value Date/Time   PHART 7.612 (HH) 08/06/2018 0902   PCO2ART 36.2 08/06/2018 0902   PO2ART 59.0 (L) 08/06/2018 0902   HCO3 36.5 (H) 08/06/2018 0902   TCO2 38 (H) 08/06/2018 0902   O2SAT 94.0 08/06/2018 0902    Condition -extremely guarded-poor prognosis  Family Communication  : Both sons [name] and [name] over the phone on 6/15  Code Status :  DNR  Diet :  Diet Order            Diet heart healthy/carb modified Room service appropriate? Yes; Fluid consistency: Thin  Diet effective now               Disposition Plan  : Expect inpatient death  Consults  :  PCCM  Procedures  :  None  DVT Prophylaxis  :  Lovenox  Lab Results  Component Value Date   PLT 175 08/06/2018    Inpatient Medications  Scheduled Meds:  Continuous Infusions: . sodium chloride Stopped (08/03/18 1729)  . morphine     PRN Meds:.albuterol, atropine, LORazepam **OR** LORazepam **OR** LORazepam, LORazepam, morphine injection, morphine CONCENTRATE  Antibiotics  :    Anti-infectives (From admission, onward)   Start     Dose/Rate Route Frequency Ordered Stop   08/03/18 1700  vancomycin (VANCOCIN) 1,250 mg in sodium chloride 0.9 % 250 mL IVPB  Status:  Discontinued     1,250 mg 166.7 mL/hr over 90 Minutes Intravenous Every 24 hours 08/14/2018 1609 08/03/18 1110   08/12/2018 1530  vancomycin (VANCOCIN) 2,000 mg in sodium chloride 0.9 % 500 mL IVPB     2,000 mg 250 mL/hr over 120 Minutes Intravenous  Once 08/06/2018 1523 07/27/2018 1912   07/27/2018 1515  ceFEPIme (MAXIPIME) 2 g in sodium chloride 0.9 % 100 mL IVPB  Status:  Discontinued     2 g 200 mL/hr over 30 Minutes Intravenous Every 8 hours 07/24/2018 1513 08/03/18  1110   08/20/2018 1245  cefTRIAXone (ROCEPHIN) 1 g in sodium chloride 0.9 % 100 mL IVPB     1 g 200 mL/hr over 30 Minutes Intravenous  Once 07/29/2018 1236 08/19/2018 1420  08/15/2018 1245  azithromycin (ZITHROMAX) 500 mg in sodium chloride 0.9 % 250 mL IVPB     500 mg 250 mL/hr over 60 Minutes Intravenous  Once 08/19/2018 1236 08/14/2018 1809       Time Spent in minutes  25    Oren Binet M.D on 08/07/2018 at 10:21 AM  To page go to [email or web address] - use universal password  [facility] -  Office  [phone]  See all Orders from today for further details   Admit date - 07/28/2018    5    Objective:   Vitals:   08/06/18 2100 08/07/18 0000 08/07/18 0400 08/07/18 0845  BP: (!) 191/173 (!) 156/84 (!) 162/89   Pulse:  74 92 86  Resp: (!) 31 (!) 40 (!) 37   Temp:  98.4 F (36.9 C) 99.1 F (37.3 C) 99 F (37.2 C)  TempSrc:  Axillary Axillary Axillary  SpO2: 97% 94% 95% 95%  Weight:      Height:        Wt Readings from Last 3 Encounters:  08/06/18 116.8 kg  07/24/18 120 kg  04/03/18 120.4 kg     Intake/Output Summary (Last 24 hours) at 08/07/2018 1021 Last data filed at 08/07/2018 0900 Gross per 24 hour  Intake 120 ml  Output 400 ml  Net -280 ml     Physical Exam Unresponsive-few tachypneic spells while I was in the room.  Although appears comfortable   Data Review:    CBC Recent Labs  Lab 08/17/2018 1055  08/03/18 0748 08/04/18 0300 08/05/18 0400 08/06/18 0815 08/06/18 0902  WBC 6.5  --  4.9 5.0 14.4* 10.8*  --   HGB 13.4   < > 12.6 12.9 15.7* 16.8* 18.0*  HCT 43.0   < > 42.7 42.2 49.7* 51.6* 53.0*  PLT 155  --  145* 158 207 175  --   MCV 94.9  --  96.4 94.8 91.9 89.1  --   MCH 29.6  --  28.4 29.0 29.0 29.0  --   MCHC 31.2  --  29.5* 30.6 31.6 32.6  --   RDW 14.6  --  14.6 14.0 13.9 13.9  --   LYMPHSABS 1.2  --   --   --   --   --   --   MONOABS 1.0  --   --   --   --   --   --   EOSABS 0.0  --   --   --   --   --   --   BASOSABS 0.0  --   --    --   --   --   --    < > = values in this interval not displayed.    Chemistries  Recent Labs  Lab 08/07/2018 1055  08/03/18 0748 08/04/18 0300 08/05/18 0850 08/06/18 0815 08/06/18 0902  NA 139   < > 141 139 140 137 136  K 4.1   < > 4.6 4.6 4.3 3.6 3.3*  CL 103  --  106 104 96* 93*  --   CO2 26  --  26 26 32 30  --   GLUCOSE 105*  --  120* 210* 222* 265*  --   BUN 13  --  15 15 17 17   --   CREATININE 1.07*  --  0.78 0.82 0.81 0.76  --   CALCIUM 8.1*  --  7.5* 7.7* 9.1 9.2  --   MG  --   --  1.9  --   --   --   --   AST 35  --  30 29 30 30   --   ALT 13  --  15 14 19 22   --   ALKPHOS 42  --  41 41 57 59  --   BILITOT 0.5  --  0.2* 0.1* <0.1* 0.5  --    < > = values in this interval not displayed.   ------------------------------------------------------------------------------------------------------------------ No results for input(s): CHOL, HDL, LDLCALC, TRIG, CHOLHDL, LDLDIRECT in the last 72 hours.  Lab Results  Component Value Date   HGBA1C 7.6 (H) 10/13/2015   ------------------------------------------------------------------------------------------------------------------ No results for input(s): TSH, T4TOTAL, T3FREE, THYROIDAB in the last 72 hours.  Invalid input(s): FREET3 ------------------------------------------------------------------------------------------------------------------ Recent Labs    08/05/18 0850 08/06/18 0815  FERRITIN 201 231    Coagulation profile No results for input(s): INR, PROTIME in the last 168 hours.  Recent Labs    08/05/18 0400 08/06/18 0815  DDIMER 1.56* 1.04*    Cardiac Enzymes No results for input(s): CKMB, TROPONINI, MYOGLOBIN in the last 168 hours.  Invalid input(s): CK ------------------------------------------------------------------------------------------------------------------    Component Value Date/Time   BNP 218.4 (H) 08/03/2018 6301    Micro Results Recent Results (from the past 240 hour(s))  SARS  Coronavirus 2     Status: Abnormal   Collection Time: 08/14/2018 10:00 AM  Result Value Ref Range Status   SARS Coronavirus 2 DETECTED (A) NOT DETECTED Final    Comment: RESULT CALLED TO, READ BACK BY AND VERIFIED WITH: E. Pulliam RN 12:30 07/24/2018 ([email or web address]) (NOTE) SARS-CoV-2 target nucleic acids are DETECTED. The SARS-CoV-2 RNA is generally detectable in upper and lower respiratory specimens during the acute phase of infection. Positive results are indicative of active infection with SARS-CoV-2. Clinical  correlation with patient history and other diagnostic information is necessary to determine patient infection status. Positive results do  not rule out bacterial infection or co-infection with other viruses. The expected result is Not Detected. Fact Sheet for Patients: http://www.biofiredefense.com/wp-content/uploads/2020/03/BIOFIRE-COVID -19-patients.pdf Fact Sheet for Healthcare Providers: http://www.biofiredefense.com/wp-content/uploads/2020/03/BIOFIRE-COVID -19-hcp.pdf This test is not yet approved or cleared by the [place] and  has been authorized for detection and/or diagnosis of SARS-CoV-2 by FDA under an Emergency  Use Authorization (EUA).  This EUA will remain in effect (meaning this test can be used) for the duration of  the COVID-19 declaration under Section 564(b)(1) of the Act, 21 U.S.C. section [phone] 3(b)(1), unless the authorization is terminated or revoked sooner. Performed at [facility], [place] [address] [place], [place] [address]   Blood Culture (routine x 2)     Status: Abnormal   Collection Time: 08/14/2018 10:22 AM   Specimen: BLOOD RIGHT HAND  Result Value Ref Range Status   Specimen Description BLOOD RIGHT HAND  Final   Special Requests   Final    BOTTLES DRAWN AEROBIC AND ANAEROBIC Blood Culture adequate volume   Culture  Setup Time   Final    GRAM POSITIVE COCCI AEROBIC BOTTLE ONLY CRITICAL VALUE NOTED.  VALUE IS CONSISTENT WITH  PREVIOUSLY REPORTED AND CALLED VALUE.    Culture (A)  Final    STAPHYLOCOCCUS EPIDERMIDIS SUSCEPTIBILITIES PERFORMED ON PREVIOUS CULTURE WITHIN THE LAST 5 DAYS. Performed at [facility], [place] [address] [place], [place] [address]    Report Status 08/05/2018 FINAL  Final  Urine culture     Status: None   Collection Time: 07/25/2018 10:22 AM   Specimen: Urine, Catheterized  Result  Value Ref Range Status   Specimen Description URINE, CATHETERIZED  Final   Special Requests Normal  Final   Culture   Final    NO GROWTH Performed at [facility], [address], [place], [place] [address]    Report Status 08/03/2018 FINAL  Final  Blood Culture (routine x 2)     Status: Abnormal   Collection Time: 07/26/2018 10:27 AM   Specimen: BLOOD LEFT HAND  Result Value Ref Range Status   Specimen Description BLOOD LEFT HAND  Final   Special Requests   Final    BOTTLES DRAWN AEROBIC AND ANAEROBIC Blood Culture adequate volume   Culture  Setup Time   Final    GRAM POSITIVE COCCI AEROBIC BOTTLE ONLY Organism ID to follow CRITICAL RESULT CALLED TO, READ BACK BY AND VERIFIED WITH: Jene Every PharmD 10:25 08/03/18 ([email or web address]) Performed at [facility], [address] [place], [place] [address]    Culture STAPHYLOCOCCUS EPIDERMIDIS (A)  Final   Report Status 08/05/2018 FINAL  Final   Organism ID, Bacteria STAPHYLOCOCCUS EPIDERMIDIS  Final      Susceptibility   Staphylococcus epidermidis - MIC*    CIPROFLOXACIN >=8 RESISTANT Resistant     ERYTHROMYCIN >=8 RESISTANT Resistant     GENTAMICIN <=0.5 SENSITIVE Sensitive     OXACILLIN >=4 RESISTANT Resistant     TETRACYCLINE <=1 SENSITIVE Sensitive     VANCOMYCIN 2 SENSITIVE Sensitive     TRIMETH/SULFA 20 SENSITIVE Sensitive     CLINDAMYCIN RESISTANT Resistant     RIFAMPIN <=0.5 SENSITIVE Sensitive     Inducible Clindamycin POSITIVE Resistant     * STAPHYLOCOCCUS EPIDERMIDIS  Blood Culture ID Panel (Reflexed)     Status: Abnormal    Collection Time: 07/25/2018 10:27 AM  Result Value Ref Range Status   Enterococcus species NOT DETECTED NOT DETECTED Final   Listeria monocytogenes NOT DETECTED NOT DETECTED Final   Staphylococcus species DETECTED (A) NOT DETECTED Final    Comment: Methicillin (oxacillin) resistant coagulase negative staphylococcus. Possible blood culture contaminant (unless isolated from more than one blood culture draw or clinical case suggests pathogenicity). No antibiotic treatment is indicated for blood  culture contaminants. CRITICAL RESULT CALLED TO, READ BACK BY AND VERIFIED WITH: Jene Every PharmD 10:25 08/03/18 ([email or web address])    Staphylococcus aureus (BCID) NOT DETECTED NOT DETECTED Final   Methicillin resistance DETECTED (A) NOT DETECTED Final    Comment: CRITICAL RESULT CALLED TO, READ BACK BY AND VERIFIED WITH: Jene Every PharmD 10:25 08/03/18 ([email or web address])    Streptococcus species NOT DETECTED NOT DETECTED Final   Streptococcus agalactiae NOT DETECTED NOT DETECTED Final   Streptococcus pneumoniae NOT DETECTED NOT DETECTED Final   Streptococcus pyogenes NOT DETECTED NOT DETECTED Final   Acinetobacter baumannii NOT DETECTED NOT DETECTED Final   Enterobacteriaceae species NOT DETECTED NOT DETECTED Final   Enterobacter cloacae complex NOT DETECTED NOT DETECTED Final   Escherichia coli NOT DETECTED NOT DETECTED Final   Klebsiella oxytoca NOT DETECTED NOT DETECTED Final   Klebsiella pneumoniae NOT DETECTED NOT DETECTED Final   Proteus species NOT DETECTED NOT DETECTED Final   Serratia marcescens NOT DETECTED NOT DETECTED Final   Haemophilus influenzae NOT DETECTED NOT DETECTED Final   Neisseria meningitidis NOT DETECTED NOT DETECTED Final   Pseudomonas aeruginosa NOT DETECTED NOT DETECTED Final   Candida albicans NOT DETECTED NOT DETECTED Final   Candida glabrata NOT DETECTED NOT DETECTED Final   Candida krusei NOT DETECTED NOT DETECTED Final   Candida parapsilosis  NOT DETECTED NOT DETECTED Final   Candida  tropicalis NOT DETECTED NOT DETECTED Final    Comment: Performed at [facility], [place] [address], [place], [place] [address]  MRSA PCR Screening     Status: None   Collection Time: 08/15/2018 11:35 PM   Specimen: Nasopharyngeal  Result Value Ref Range Status   MRSA by PCR NEGATIVE NEGATIVE Final    Comment:        The GeneXpert MRSA Assay (FDA approved for NASAL specimens only), is one component of a comprehensive MRSA colonization surveillance program. It is not intended to diagnose MRSA infection nor to guide or monitor treatment for MRSA infections. Performed at [facility], [place] [address] [place], [place] [address]   Culture, blood (routine x 2)     Status: None (Preliminary result)   Collection Time: 08/04/18  3:10 PM   Specimen: BLOOD  Result Value Ref Range Status   Specimen Description   Final    BLOOD RIGHT ARM Performed at [place] [address], [place], [place] [address]    Special Requests   Final    BOTTLES DRAWN AEROBIC ONLY Blood Culture adequate volume Performed at [place] [address], [place], [place] [address]    Culture   Final    NO GROWTH 2 DAYS Performed at [place] [address], [place], [place] [address]    Report Status PENDING  Incomplete  Culture, blood (routine x 2)     Status: None (Preliminary result)   Collection Time: 08/04/18  3:20 PM   Specimen: BLOOD  Result Value Ref Range Status   Specimen Description   Final    BLOOD RIGHT ARM Performed at [facility] [address] [place], [place] [address]    Special Requests   Final    BOTTLES DRAWN AEROBIC ONLY Blood Culture adequate volume Performed at [place] [address], [place], [place] [address]    Culture   Final    NO GROWTH 2 DAYS Performed at [place] [address] [place],  [address]    Report Status PENDING  Incomplete     Radiology Reports Dg Chest Port 1 View  Result Date: 08/05/2018 CLINICAL DATA:  Shortness of breath. EXAM: PORTABLE CHEST 1 VIEW COMPARISON:  08/03/2018 and older studies. FINDINGS: Hazy bilateral airspace opacities are again noted, which appear mildly improved at the bases, stable in the right upper lobe. No new lung abnormalities. No pleural effusion or pneumothorax. IMPRESSION: 1. Mildly improved lung aeration with a decrease in airspace opacities at the lung bases. No new abnormalities. Electronically Signed   By: Lajean Manes M.D.   On: 08/05/2018 22:30   Dg Chest Port 1 View  Result Date: 08/03/2018 CLINICAL DATA:  Encounter for respiratory failure. EXAM: PORTABLE CHEST 1 VIEW COMPARISON:  One-view chest x-ray 08/12/2018 FINDINGS: Heart is enlarged. Lung volumes are low. Progressive bilateral airspace disease is noted, left greater than right. Mild pulmonary vascular congestion is again seen. There is some fluid along the right minor fissure and at the right base. IMPRESSION: 1. Progressive bilateral airspace disease, most prominent at the lung bases, left greater than right. 2. Right pleural effusion. Electronically Signed   By: San Morelle M.D.   On: 08/03/2018 06:05   Dg Chest Port 1 View  Result Date: 07/31/2018 CLINICAL DATA:  Altered mental status.  Hypoxia. EXAM: PORTABLE CHEST 1 VIEW  COMPARISON:  One-view chest x-ray 03/26/2018 FINDINGS: The heart is enlarged. Lung volumes are low. Patchy bilateral airspace opacities are present. No significant focal consolidation is present. There is no edema or effusion. Visualized soft tissues and bony thorax are unremarkable. IMPRESSION: 1. Stable cardiomegaly without failure. 2. Patchy bilateral airspace disease. This is concerning for atypical infection. Edema is considered less likely. 3. Decreased lung volumes. Electronically Signed   By: San Morelle M.D.   On: 07/24/2018 11:53   [place] Ekg Site Rite  Result Date: 08/06/2018  If Site Rite image not attached, placement could not be confirmed due to current cardiac rhythm.

## 2018-08-08 NOTE — Progress Notes (Signed)
Spoke with son Georgina Peer to arrange family visit son will call back with confirmed time frame.

## 2018-08-08 NOTE — Care Management Important Message (Signed)
Important Message  Patient Details  Name: [name] [name] MRN: [id number] Date of Birth: 09-28-1945   Medicare Important Message Given:  Yes  IM mailed on 08/08/2018 by CMA , to emergency contact on file  Kellyann Ordway [address] 08/08/2018, 3:45 PM

## 2018-08-08 NOTE — Progress Notes (Signed)
Patient has significantly declined and is on comfort care.  We were able to arrange visitation with patient's son [name] [name] in speciality visit isolation room 172.  [name] [name] [name] spent an emotional 15 minutes at the bedside speaking to his mother, he lovingly called "[name] [name]."  [name] [name] was able also call his brother and his uncle (patient's brother), via Therapist, sports phone Facetime during this time.  Family is very grateful to hospital staff and excellent communication during this time.  [name] reports his family would like to use [facility] in [place], [place] at [phone] upon patient's passing.

## 2018-08-08 NOTE — Progress Notes (Signed)
PROGRESS NOTE                                                                                                                                                                                                             Patient Demographics:    [name] [name], is a 73 y.o. female, DOB - March 20, 1945, XKG:[id number]  Outpatient Primary MD for the patient is [facility] [place]    LOS - 6  Chief Complaint  Patient presents with  . Fever       Brief Narrative: Patient is a 73 y.o. female with history of COPD, depression, DM-2, chronic diastolic heart failure, schizophrenia, dementia-presented with confusion-was found to have acute on chronic hypercarbic respiratory failure and COVID-19 viral pneumonia.  Due to rapid improvement-patient was able to avoid intubation.    Unfortunately after a few days of improvement-patient started to deteriorate again on 6/14-after discussion with family she was transitioned to full comfort measures.  See below for further details.   Subjective:   Remains unresponsive on morphine infusion.   Assessment  & Plan :   Acute on chronic hypercapnic and hypoxemic respiratory failure: Hypercapnia resolved with steroids and bronchodilators-likely secondary to COPD exacerbation use of psychotropic medications, hypoxemia secondary to COVID-19 pneumonia.  Initially improved with supportive care but on 6/14-started deteriorating-now transition to full comfort care.  COPD exacerbation: Likely causing hypercapnia-probably worsened by psychotropic medications.  Had usually improved-but now has developed worsening encephalopathy-and has been subsequently transitioned to full comfort measures.    COVID-19 viral pneumonia: Bilateral infiltrates consistent with viral pneumonia-since procalcitonin was not elevated-all anti-biotics were discontinued on 6/11.  Was on steroids-but since clinically  deteriorated and has now been transitioned to comfort measures-no longer on any form of steroids.  Due to very poor overall state of health-and the high likelihood that the patient's life expectancy is less than 6 months-is deemed not to be a candidate for Remdesivir/Actemra and aggressive care.     COVID-19 Labs:  Recent Labs    08/06/18 0815  DDIMER 1.04*  FERRITIN 231  LDH 386*  CRP 6.3*    Lab Results  Component Value Date   SARSCOV2NAA DETECTED (A) 07/31/2018     COVID-19 Medications: 6/10>> Solu-Medrol  Gram + cocci bacteremia: likely coag neg staph-probably contaminant-repeat cultures on 6/12-negative so far-continue to follow.  No  need for initiating antimicrobial therapy at this time patient on comfort measures.  Acute metabolic Encephalopathy: Secondary to hypercarbia-visually improved but then worsened with worsening encephalopathy.  Currently on full comfort measures.  Chronic diastolic heart failure: No evidence of gross volume overload-comfort measures in place  Insulin-dependent DM-2: All insulin discontinued as comfort measures in place  Hypertension: All antihypertensive medications discontinued as comfort measures in place  Schizophrenia: All psychotropic medications discontinued as comfort measures in place  Hypothyroidism: No longer on levothyroxine-as comfort measures in place  Recent UTI: Has completed a course of antimicrobial therapy-repeat blood culture on 6/10--no need for any further antimicrobial therapy at this point  Dementia with risk for delirium: Supportive care   History of right renal mass-likely renal cell carcinoma:   Debility/deconditioning: Very frail and debilitated-worsening at this point due to acute illness on top of frailty/debility.    Palliative care: DNR in place-multiple medical comorbidities as outlined above-initially improved but has now rapidly deteriorated over the past few days.  After extensive discussion with the  patient's son-she is now been transitioned to full comfort measures.  Due to tachypneic spells-we will start morphine infusion-continue with as needed morphine as needed.    ABG:    Component Value Date/Time   PHART 7.612 (HH) 08/06/2018 0902   PCO2ART 36.2 08/06/2018 0902   PO2ART 59.0 (L) 08/06/2018 0902   HCO3 36.5 (H) 08/06/2018 0902   TCO2 38 (H) 08/06/2018 0902   O2SAT 94.0 08/06/2018 0902    Condition -extremely guarded-poor prognosis  Family Communication  :  [name]  over the phone on 6/16-he plans to visit his mother before she passes later today.  Code Status :  DNR  Diet :  Diet Order            Diet heart healthy/carb modified Room service appropriate? Yes; Fluid consistency: Thin  Diet effective now               Disposition Plan  : Expect inpatient death in a matter of few hours to few days  Consults  :  PCCM  Procedures  :  None  DVT Prophylaxis  :  Lovenox  Lab Results  Component Value Date   PLT 175 08/06/2018    Inpatient Medications  Scheduled Meds:  Continuous Infusions: . sodium chloride Stopped (08/03/18 1729)  . morphine 3 mg/hr (08/07/18 2109)   PRN Meds:.albuterol, atropine, LORazepam **OR** LORazepam **OR** LORazepam, LORazepam, morphine injection, morphine CONCENTRATE  Antibiotics  :    Anti-infectives (From admission, onward)   Start     Dose/Rate Route Frequency Ordered Stop   08/03/18 1700  vancomycin (VANCOCIN) 1,250 mg in sodium chloride 0.9 % 250 mL IVPB  Status:  Discontinued     1,250 mg 166.7 mL/hr over 90 Minutes Intravenous Every 24 hours 08/11/2018 1609 08/03/18 1110   08/11/2018 1530  vancomycin (VANCOCIN) 2,000 mg in sodium chloride 0.9 % 500 mL IVPB     2,000 mg 250 mL/hr over 120 Minutes Intravenous  Once 08/03/2018 1523 08/22/2018 1912   08/17/2018 1515  ceFEPIme (MAXIPIME) 2 g in sodium chloride 0.9 % 100 mL IVPB  Status:  Discontinued     2 g 200 mL/hr over 30 Minutes Intravenous Every 8 hours 08/19/2018 1513 08/03/18  1110   08/20/2018 1245  cefTRIAXone (ROCEPHIN) 1 g in sodium chloride 0.9 % 100 mL IVPB     1 g 200 mL/hr over 30 Minutes Intravenous  Once 07/24/2018 1236 08/05/2018 1420   08/19/2018 1245  azithromycin (ZITHROMAX) 500 mg in sodium chloride 0.9 % 250 mL IVPB     500 mg 250 mL/hr over 60 Minutes Intravenous  Once 07/30/2018 1236 07/24/2018 1809       Time Spent in minutes  25    Oren Binet M.D on 08/08/2018 at 12:16 PM  To page go to [email or web address] - use universal password  [facility] -  Office  [phone]  See all Orders from today for further details   Admit date - 08/03/2018    6    Objective:   Vitals:   08/08/18 0535 08/08/18 0715 08/08/18 0813 08/08/18 1115  BP: (!) 159/102  (!) 158/82   Pulse: (!) 121 70 80 (!) 106  Resp: (!) 24 20 (!) 21 13  Temp: 100 F (37.8 C)  98.4 F (36.9 C)   TempSrc: Axillary  Axillary   SpO2: (!) 74% (!) 83% (!) 86% (!) 86%  Weight:      Height:        Wt Readings from Last 3 Encounters:  08/06/18 116.8 kg  07/24/18 120 kg  04/03/18 120.4 kg     Intake/Output Summary (Last 24 hours) at 08/08/2018 1216 Last data filed at 08/08/2018 1100 Gross per 24 hour  Intake 50.01 ml  Output 950 ml  Net -899.99 ml     Physical Exam Unresponsive-few tachypneic spells while I was in the room.  Although appears comfortable   Data Review:    CBC Recent Labs  Lab 08/16/2018 1055  08/03/18 0748 08/04/18 0300 08/05/18 0400 08/06/18 0815 08/06/18 0902  WBC 6.5  --  4.9 5.0 14.4* 10.8*  --   HGB 13.4   < > 12.6 12.9 15.7* 16.8* 18.0*  HCT 43.0   < > 42.7 42.2 49.7* 51.6* 53.0*  PLT 155  --  145* 158 207 175  --   MCV 94.9  --  96.4 94.8 91.9 89.1  --   MCH 29.6  --  28.4 29.0 29.0 29.0  --   MCHC 31.2  --  29.5* 30.6 31.6 32.6  --   RDW 14.6  --  14.6 14.0 13.9 13.9  --   LYMPHSABS 1.2  --   --   --   --   --   --   MONOABS 1.0  --   --   --   --   --   --   EOSABS 0.0  --   --   --   --   --   --   BASOSABS 0.0  --   --   --    --   --   --    < > = values in this interval not displayed.    Chemistries  Recent Labs  Lab 08/19/2018 1055  08/03/18 0748 08/04/18 0300 08/05/18 0850 08/06/18 0815 08/06/18 0902  NA 139   < > 141 139 140 137 136  K 4.1   < > 4.6 4.6 4.3 3.6 3.3*  CL 103  --  106 104 96* 93*  --   CO2 26  --  26 26 32 30  --   GLUCOSE 105*  --  120* 210* 222* 265*  --   BUN 13  --  15 15 17 17   --   CREATININE 1.07*  --  0.78 0.82 0.81 0.76  --   CALCIUM 8.1*  --  7.5* 7.7* 9.1 9.2  --   MG  --   --  1.9  --   --   --   --   AST 35  --  30 29 30 30   --   ALT 13  --  15 14 19 22   --   ALKPHOS 42  --  41 41 57 59  --   BILITOT 0.5  --  0.2* 0.1* <0.1* 0.5  --    < > = values in this interval not displayed.   ------------------------------------------------------------------------------------------------------------------ No results for input(s): CHOL, HDL, LDLCALC, TRIG, CHOLHDL, LDLDIRECT in the last 72 hours.  Lab Results  Component Value Date   HGBA1C 7.6 (H) 10/13/2015   ------------------------------------------------------------------------------------------------------------------ No results for input(s): TSH, T4TOTAL, T3FREE, THYROIDAB in the last 72 hours.  Invalid input(s): FREET3 ------------------------------------------------------------------------------------------------------------------ Recent Labs    08/06/18 0815  FERRITIN 231    Coagulation profile No results for input(s): INR, PROTIME in the last 168 hours.  Recent Labs    08/06/18 0815  DDIMER 1.04*    Cardiac Enzymes No results for input(s): CKMB, TROPONINI, MYOGLOBIN in the last 168 hours.  Invalid input(s): CK ------------------------------------------------------------------------------------------------------------------    Component Value Date/Time   BNP 218.4 (H) 08/03/2018 8341    Micro Results Recent Results (from the past 240 hour(s))  SARS Coronavirus 2     Status: Abnormal    Collection Time: 08/07/2018 10:00 AM  Result Value Ref Range Status   SARS Coronavirus 2 DETECTED (A) NOT DETECTED Final    Comment: RESULT CALLED TO, READ BACK BY AND VERIFIED WITH: E. Pulliam RN 12:30 08/05/2018 ([email or web address]) (NOTE) SARS-CoV-2 target nucleic acids are DETECTED. The SARS-CoV-2 RNA is generally detectable in upper and lower respiratory specimens during the acute phase of infection. Positive results are indicative of active infection with SARS-CoV-2. Clinical  correlation with patient history and other diagnostic information is necessary to determine patient infection status. Positive results do  not rule out bacterial infection or co-infection with other viruses. The expected result is Not Detected. Fact Sheet for Patients: http://www.biofiredefense.com/wp-content/uploads/2020/03/BIOFIRE-COVID -19-patients.pdf Fact Sheet for Healthcare Providers: http://www.biofiredefense.com/wp-content/uploads/2020/03/BIOFIRE-COVID -19-hcp.pdf This test is not yet approved or cleared by the [place] and  has been authorized for detection and/or diagnosis of SARS-CoV-2 by FDA under an Emergency  Use Authorization (EUA).  This EUA will remain in effect (meaning this test can be used) for the duration of  the COVID-19 declaration under Section 564(b)(1) of the Act, 21 U.S.C. section [phone] 3(b)(1), unless the authorization is terminated or revoked sooner. Performed at [facility], [place] [address], [place], [place] [address]   Blood Culture (routine x 2)     Status: Abnormal   Collection Time: 08/11/2018 10:22 AM   Specimen: BLOOD RIGHT HAND  Result Value Ref Range Status   Specimen Description BLOOD RIGHT HAND  Final   Special Requests   Final    BOTTLES DRAWN AEROBIC AND ANAEROBIC Blood Culture adequate volume   Culture  Setup Time   Final    GRAM POSITIVE COCCI AEROBIC BOTTLE ONLY CRITICAL VALUE NOTED.  VALUE IS CONSISTENT WITH PREVIOUSLY REPORTED AND CALLED VALUE.     Culture (A)  Final    STAPHYLOCOCCUS EPIDERMIDIS SUSCEPTIBILITIES PERFORMED ON PREVIOUS CULTURE WITHIN THE LAST 5 DAYS. Performed at [facility], [place] [address] [place], [place] [address]    Report Status 08/05/2018 FINAL  Final  Urine culture     Status: None   Collection Time: 08/16/2018 10:22 AM   Specimen: Urine, Catheterized  Result Value Ref Range Status  Specimen Description URINE, CATHETERIZED  Final   Special Requests Normal  Final   Culture   Final    NO GROWTH Performed at [facility], [address], [place], [place] [address]    Report Status 08/03/2018 FINAL  Final  Blood Culture (routine x 2)     Status: Abnormal   Collection Time: 07/31/2018 10:27 AM   Specimen: BLOOD LEFT HAND  Result Value Ref Range Status   Specimen Description BLOOD LEFT HAND  Final   Special Requests   Final    BOTTLES DRAWN AEROBIC AND ANAEROBIC Blood Culture adequate volume   Culture  Setup Time   Final    GRAM POSITIVE COCCI AEROBIC BOTTLE ONLY Organism ID to follow CRITICAL RESULT CALLED TO, READ BACK BY AND VERIFIED WITH: Jene Every PharmD 10:25 08/03/18 ([email or web address]) Performed at [facility], [address] [place], [place] [address]    Culture STAPHYLOCOCCUS EPIDERMIDIS (A)  Final   Report Status 08/05/2018 FINAL  Final   Organism ID, Bacteria STAPHYLOCOCCUS EPIDERMIDIS  Final      Susceptibility   Staphylococcus epidermidis - MIC*    CIPROFLOXACIN >=8 RESISTANT Resistant     ERYTHROMYCIN >=8 RESISTANT Resistant     GENTAMICIN <=0.5 SENSITIVE Sensitive     OXACILLIN >=4 RESISTANT Resistant     TETRACYCLINE <=1 SENSITIVE Sensitive     VANCOMYCIN 2 SENSITIVE Sensitive     TRIMETH/SULFA 20 SENSITIVE Sensitive     CLINDAMYCIN RESISTANT Resistant     RIFAMPIN <=0.5 SENSITIVE Sensitive     Inducible Clindamycin POSITIVE Resistant     * STAPHYLOCOCCUS EPIDERMIDIS  Blood Culture ID Panel (Reflexed)     Status: Abnormal   Collection Time: 08/18/2018 10:27 AM  Result Value  Ref Range Status   Enterococcus species NOT DETECTED NOT DETECTED Final   Listeria monocytogenes NOT DETECTED NOT DETECTED Final   Staphylococcus species DETECTED (A) NOT DETECTED Final    Comment: Methicillin (oxacillin) resistant coagulase negative staphylococcus. Possible blood culture contaminant (unless isolated from more than one blood culture draw or clinical case suggests pathogenicity). No antibiotic treatment is indicated for blood  culture contaminants. CRITICAL RESULT CALLED TO, READ BACK BY AND VERIFIED WITH: Jene Every PharmD 10:25 08/03/18 (wilsonm)    Staphylococcus aureus (BCID) NOT DETECTED NOT DETECTED Final   Methicillin resistance DETECTED (A) NOT DETECTED Final    Comment: CRITICAL RESULT CALLED TO, READ BACK BY AND VERIFIED WITH: Jene Every PharmD 10:25 08/03/18 ([email or web address])    Streptococcus species NOT DETECTED NOT DETECTED Final   Streptococcus agalactiae NOT DETECTED NOT DETECTED Final   Streptococcus pneumoniae NOT DETECTED NOT DETECTED Final   Streptococcus pyogenes NOT DETECTED NOT DETECTED Final   Acinetobacter baumannii NOT DETECTED NOT DETECTED Final   Enterobacteriaceae species NOT DETECTED NOT DETECTED Final   Enterobacter cloacae complex NOT DETECTED NOT DETECTED Final   Escherichia coli NOT DETECTED NOT DETECTED Final   Klebsiella oxytoca NOT DETECTED NOT DETECTED Final   Klebsiella pneumoniae NOT DETECTED NOT DETECTED Final   Proteus species NOT DETECTED NOT DETECTED Final   Serratia marcescens NOT DETECTED NOT DETECTED Final   Haemophilus influenzae NOT DETECTED NOT DETECTED Final   Neisseria meningitidis NOT DETECTED NOT DETECTED Final   Pseudomonas aeruginosa NOT DETECTED NOT DETECTED Final   Candida albicans NOT DETECTED NOT DETECTED Final   Candida glabrata NOT DETECTED NOT DETECTED Final   Candida krusei NOT DETECTED NOT DETECTED Final   Candida parapsilosis NOT DETECTED NOT DETECTED Final  Candida tropicalis NOT DETECTED NOT DETECTED Final     Comment: Performed at [facility], [place] [address] [place], [place] [address]  MRSA PCR Screening     Status: None   Collection Time: 07/29/2018 11:35 PM   Specimen: Nasopharyngeal  Result Value Ref Range Status   MRSA by PCR NEGATIVE NEGATIVE Final    Comment:        The GeneXpert MRSA Assay (FDA approved for NASAL specimens only), is one component of a comprehensive MRSA colonization surveillance program. It is not intended to diagnose MRSA infection nor to guide or monitor treatment for MRSA infections. Performed at [facility], [place] [address] [place], [place] [address]   Culture, blood (routine x 2)     Status: None (Preliminary result)   Collection Time: 08/04/18  3:10 PM   Specimen: BLOOD  Result Value Ref Range Status   Specimen Description   Final    BLOOD RIGHT ARM Performed at [facility] [address], [place], [place] [address]    Special Requests   Final    BOTTLES DRAWN AEROBIC ONLY Blood Culture adequate volume Performed at [facility] [address], [place], [place] [address]    Culture   Final    NO GROWTH 2 DAYS Performed at [facility] [address], [place], [place] [address]    Report Status PENDING  Incomplete  Culture, blood (routine x 2)     Status: None (Preliminary result)   Collection Time: 08/04/18  3:20 PM   Specimen: BLOOD  Result Value Ref Range Status   Specimen Description   Final    BLOOD RIGHT ARM Performed at [facility] [address], [place], [place] [address]    Special Requests   Final    BOTTLES DRAWN AEROBIC ONLY Blood Culture adequate volume Performed at [facility] [address], [place], [place] [address]    Culture   Final    NO GROWTH 2 DAYS Performed at [facility] [address] [place],  [address]    Report Status PENDING  Incomplete    Radiology Reports Dg Chest Port 1 View   Result Date: 08/05/2018 CLINICAL DATA:  Shortness of breath. EXAM: PORTABLE CHEST 1 VIEW COMPARISON:  08/03/2018 and older studies. FINDINGS: Hazy bilateral airspace opacities are again noted, which appear mildly improved at the bases, stable in the right upper lobe. No new lung abnormalities. No pleural effusion or pneumothorax. IMPRESSION: 1. Mildly improved lung aeration with a decrease in airspace opacities at the lung bases. No new abnormalities. Electronically Signed   By: Lajean Manes M.D.   On: 08/05/2018 22:30   Dg Chest Port 1 View  Result Date: 08/03/2018 CLINICAL DATA:  Encounter for respiratory failure. EXAM: PORTABLE CHEST 1 VIEW COMPARISON:  One-view chest x-ray 07/25/2018 FINDINGS: Heart is enlarged. Lung volumes are low. Progressive bilateral airspace disease is noted, left greater than right. Mild pulmonary vascular congestion is again seen. There is some fluid along the right minor fissure and at the right base. IMPRESSION: 1. Progressive bilateral airspace disease, most prominent at the lung bases, left greater than right. 2. Right pleural effusion. Electronically Signed   By: San Morelle M.D.   On: 08/03/2018 06:05   Dg Chest Port 1 View  Result Date: 08/18/2018 CLINICAL DATA:  Altered mental status.  Hypoxia. EXAM: PORTABLE CHEST 1 VIEW COMPARISON:  One-view chest x-ray 03/26/2018 FINDINGS:  The heart is enlarged. Lung volumes are low. Patchy bilateral airspace opacities are present. No significant focal consolidation is present. There is no edema or effusion. Visualized soft tissues and bony thorax are unremarkable. IMPRESSION: 1. Stable cardiomegaly without failure. 2. Patchy bilateral airspace disease. This is concerning for atypical infection. Edema is considered less likely. 3. Decreased lung volumes. Electronically Signed   By: San Morelle M.D.   On: 08/05/2018 11:53   [place] Ekg Site Rite  Result Date: 08/06/2018 If Site Rite image not attached, placement could  not be confirmed due to current cardiac rhythm.

## 2018-08-09 LAB — CULTURE, BLOOD (ROUTINE X 2)
Culture: NO GROWTH
Culture: NO GROWTH
Special Requests: ADEQUATE
Special Requests: ADEQUATE

## 2018-08-22 DIAGNOSIS — K262 Acute duodenal ulcer with both hemorrhage and perforation: Secondary | ICD-10-CM | POA: Diagnosis not present

## 2018-08-22 DIAGNOSIS — I10 Essential (primary) hypertension: Secondary | ICD-10-CM | POA: Diagnosis not present

## 2018-08-22 DIAGNOSIS — J449 Chronic obstructive pulmonary disease, unspecified: Secondary | ICD-10-CM | POA: Diagnosis not present

## 2018-08-22 DIAGNOSIS — I509 Heart failure, unspecified: Secondary | ICD-10-CM | POA: Diagnosis not present

## 2018-08-23 NOTE — Progress Notes (Signed)
Patient monitor on comfort care setting was heard alarming.  RN entered room to assess.  Patient was not breathing.  Upon ausculation, no heart beat, no respirations heard for 2 minutes.  Secondary RN, Teodoro Spray asked to also assess.  Cardiac and respiratory arrest confirmed.    Time of Death called at 40.  Will notify MD and chage RN now and patient's son.

## 2018-08-23 NOTE — Progress Notes (Signed)
Per patient's son, [name], family requests that the patient's Bible be transported with her body to the [facility].  Patient's Bible has been placed in a clean, labeled patient belonging bag x2 and placed inside the body bag, next to patient to ensure transport to funeral home.

## 2018-08-23 NOTE — Death Summary Note (Signed)
DEATH SUMMARY   Patient Details  Name: [name] [name] MRN: [id number] DOB: 04-18-1945  Admission/Discharge Information   Admit Date:  Aug 25, 2018  Date of Death: Date of Death: 2018/09/01  Time of Death: Time of Death: 0858  Length of Stay: May 22, 2022  Referring Physician: [facility]   Reason(s) for Hospitalization   Patient is a11 y.o.femalewith history of COPD, depression, DM-2, chronic diastolic heart failure, schizophrenia, dementia-presented with confusion-was found to have acute on chronic hypercarbic respiratory failure and COVID-19 viral pneumonia. Due to rapid improvement-patient was able to avoid intubation.   Unfortunately after a few days of improvement-patient started to deteriorate again on 6/14-after discussion with family she was transitioned to full comfort measures.  he passed away 09/01/18 at 8:58 AM  Diagnoses  Preliminary cause of death:   - Acute on chronic respiratory failure - COPD - Covid 19 infection  Secondary Diagnoses (including complications and co-morbidities):  Active Problems:   Acute hypercapnic respiratory failure (HCC) Acute on chronic hypercapnic and hypoxemic respiratory failure: Hypercapnia resolved with steroids and bronchodilators-likely secondary to COPD exacerbation use of psychotropic medications, hypoxemia secondary to COVID-19 pneumonia.  Initially improved with supportive care but on 6/14-started deteriorating- transitioned to full comfort care.  COPD exacerbation: Likely causing hypercapnia-probably worsened by psychotropic medications.  Had usually improved-but now has developed worsening encephalopathy-and has been subsequently transitioned to full comfort measures.    COVID-19 viral pneumonia: Bilateral infiltrates consistent with viral pneumonia-since procalcitonin was not elevated-all anti-biotics were discontinued on 6/11.  Was on steroids-but since clinically deteriorated and has now been transitioned to  comfort measures-no longer on any form of steroids.  Due to very poor overall state of health-and the high likelihood that the patient's life expectancy is less than 6 months-is deemed not to be a candidate for Remdesivir/Actemra and aggressive care.     COVID-19 Labs:  Recent Labs (last 2 labs)      Recent Labs    08/06/18 0815  DDIMER 1.04*  FERRITIN 231  LDH 386*  CRP 6.3*      Recent Labs       Lab Results  Component Value Date   SARSCOV2NAA DETECTED (A) 08-25-2018       COVID-19 Medications: 6/10>> Solu-Medrol  Gram + cocci bacteremia: likely coag neg staph-probably contaminant-repeat cultures on 7/82-UMPNTIRW   Acute metabolic Encephalopathy: Secondary to hypercarbia-visually improved but then worsened with worsening encephalopathy.    Chronic diastolic heart failure: No evidence of gross volume overload-comfort measures in place  Insulin-dependent DM-2:   Hypertension:   Schizophrenia:  Hypothyroidism:   Recent UTI:Has completed a course of antimicrobial therapy-repeat blood culture on 6/10--no need for any further antimicrobial therapy at this point  Dementia with risk for delirium:   History of right renal mass-likely renal cell carcinoma:  Debility/deconditioning:   Palliative care: DNR in place-multiple medical comorbidities as outlined above-initially improved but then  rapidly deteriorated over the past few days.  After extensive discussion with the patient's son with previous MD-she is has been transitioned to full comfort measures.     Pertinent Labs and Studies  Significant Diagnostic Studies Dg Chest Port 1 View  Result Date: 08/05/2018 CLINICAL DATA:  Shortness of breath. EXAM: PORTABLE CHEST 1 VIEW COMPARISON:  08/03/2018 and older studies. FINDINGS: Hazy bilateral airspace opacities are again noted, which appear mildly improved at the bases, stable in the right upper lobe. No new lung abnormalities. No pleural effusion  or pneumothorax. IMPRESSION: 1. Mildly improved lung aeration with  a decrease in airspace opacities at the lung bases. No new abnormalities. Electronically Signed   By: Lajean Manes M.D.   On: 08/05/2018 22:30   Dg Chest Port 1 View  Result Date: 08/03/2018 CLINICAL DATA:  Encounter for respiratory failure. EXAM: PORTABLE CHEST 1 VIEW COMPARISON:  One-view chest x-ray 08/12/2018 FINDINGS: Heart is enlarged. Lung volumes are low. Progressive bilateral airspace disease is noted, left greater than right. Mild pulmonary vascular congestion is again seen. There is some fluid along the right minor fissure and at the right base. IMPRESSION: 1. Progressive bilateral airspace disease, most prominent at the lung bases, left greater than right. 2. Right pleural effusion. Electronically Signed   By: San Morelle M.D.   On: 08/03/2018 06:05   Dg Chest Port 1 View  Result Date: 08/04/2018 CLINICAL DATA:  Altered mental status.  Hypoxia. EXAM: PORTABLE CHEST 1 VIEW COMPARISON:  One-view chest x-ray 03/26/2018 FINDINGS: The heart is enlarged. Lung volumes are low. Patchy bilateral airspace opacities are present. No significant focal consolidation is present. There is no edema or effusion. Visualized soft tissues and bony thorax are unremarkable. IMPRESSION: 1. Stable cardiomegaly without failure. 2. Patchy bilateral airspace disease. This is concerning for atypical infection. Edema is considered less likely. 3. Decreased lung volumes. Electronically Signed   By: San Morelle M.D.   On: 07/30/2018 11:53   [place] Ekg Site Rite  Result Date: 08/06/2018 If Site Rite image not attached, placement could not be confirmed due to current cardiac rhythm.   Microbiology Recent Results (from the past 240 hour(s))  SARS Coronavirus 2     Status: Abnormal   Collection Time: 07/24/2018 10:00 AM  Result Value Ref Range Status   SARS Coronavirus 2 DETECTED (A) NOT DETECTED Final    Comment: RESULT CALLED TO, READ  BACK BY AND VERIFIED WITH: E. Pulliam RN 12:30 08/11/2018 ([email or web address]) (NOTE) SARS-CoV-2 target nucleic acids are DETECTED. The SARS-CoV-2 RNA is generally detectable in upper and lower respiratory specimens during the acute phase of infection. Positive results are indicative of active infection with SARS-CoV-2. Clinical  correlation with patient history and other diagnostic information is necessary to determine patient infection status. Positive results do  not rule out bacterial infection or co-infection with other viruses. The expected result is Not Detected. Fact Sheet for Patients: http://www.biofiredefense.com/wp-content/uploads/2020/03/BIOFIRE-COVID -19-patients.pdf Fact Sheet for Healthcare Providers: http://www.biofiredefense.com/wp-content/uploads/2020/03/BIOFIRE-COVID -19-hcp.pdf This test is not yet approved or cleared by the [place] and  has been authorized for detection and/or diagnosis of SARS-CoV-2 by FDA under an Emergency  Use Authorization (EUA).  This EUA will remain in effect (meaning this test can be used) for the duration of  the COVID-19 declaration under Section 564(b)(1) of the Act, 21 U.S.C. section [phone] 3(b)(1), unless the authorization is terminated or revoked sooner. Performed at [facility], [place] [address], [place], [place] [address]   Blood Culture (routine x 2)     Status: Abnormal   Collection Time: 08/06/2018 10:22 AM   Specimen: BLOOD RIGHT HAND  Result Value Ref Range Status   Specimen Description BLOOD RIGHT HAND  Final   Special Requests   Final    BOTTLES DRAWN AEROBIC AND ANAEROBIC Blood Culture adequate volume   Culture  Setup Time   Final    GRAM POSITIVE COCCI AEROBIC BOTTLE ONLY CRITICAL VALUE NOTED.  VALUE IS CONSISTENT WITH PREVIOUSLY REPORTED AND CALLED VALUE.    Culture (A)  Final    STAPHYLOCOCCUS EPIDERMIDIS SUSCEPTIBILITIES PERFORMED ON PREVIOUS CULTURE  WITHIN THE LAST 5 DAYS. Performed at [facility], [place] [address], [place], [place] [address]    Report Status 08/05/2018 FINAL  Final  Urine culture     Status: None   Collection Time: 07/26/2018 10:22 AM   Specimen: Urine, Catheterized  Result Value Ref Range Status   Specimen Description URINE, CATHETERIZED  Final   Special Requests Normal  Final   Culture   Final    NO GROWTH Performed at [facility], [place] [address], [place], [place] [address]    Report Status 08/03/2018 FINAL  Final  Blood Culture (routine x 2)     Status: Abnormal   Collection Time: 08/16/2018 10:27 AM   Specimen: BLOOD LEFT HAND  Result Value Ref Range Status   Specimen Description BLOOD LEFT HAND  Final   Special Requests   Final    BOTTLES DRAWN AEROBIC AND ANAEROBIC Blood Culture adequate volume   Culture  Setup Time   Final    GRAM POSITIVE COCCI AEROBIC BOTTLE ONLY Organism ID to follow CRITICAL RESULT CALLED TO, READ BACK BY AND VERIFIED WITH: Jene Every PharmD 10:25 08/03/18 ([email or web address]) Performed at [facility], [address], [place], [place] [address]    Culture STAPHYLOCOCCUS EPIDERMIDIS (A)  Final   Report Status 08/05/2018 FINAL  Final   Organism ID, Bacteria STAPHYLOCOCCUS EPIDERMIDIS  Final      Susceptibility   Staphylococcus epidermidis - MIC*    CIPROFLOXACIN >=8 RESISTANT Resistant     ERYTHROMYCIN >=8 RESISTANT Resistant     GENTAMICIN <=0.5 SENSITIVE Sensitive     OXACILLIN >=4 RESISTANT Resistant     TETRACYCLINE <=1 SENSITIVE Sensitive     VANCOMYCIN 2 SENSITIVE Sensitive     TRIMETH/SULFA 20 SENSITIVE Sensitive     CLINDAMYCIN RESISTANT Resistant     RIFAMPIN <=0.5 SENSITIVE Sensitive     Inducible Clindamycin POSITIVE Resistant     * STAPHYLOCOCCUS EPIDERMIDIS  Blood Culture ID Panel (Reflexed)     Status: Abnormal   Collection Time: 08/13/2018 10:27 AM  Result Value Ref Range Status   Enterococcus species NOT DETECTED NOT DETECTED Final   Listeria monocytogenes NOT DETECTED NOT DETECTED Final   Staphylococcus species  DETECTED (A) NOT DETECTED Final    Comment: Methicillin (oxacillin) resistant coagulase negative staphylococcus. Possible blood culture contaminant (unless isolated from more than one blood culture draw or clinical case suggests pathogenicity). No antibiotic treatment is indicated for blood  culture contaminants. CRITICAL RESULT CALLED TO, READ BACK BY AND VERIFIED WITH: Jene Every PharmD 10:25 08/03/18 ([email or web address])    Staphylococcus aureus (BCID) NOT DETECTED NOT DETECTED Final   Methicillin resistance DETECTED (A) NOT DETECTED Final    Comment: CRITICAL RESULT CALLED TO, READ BACK BY AND VERIFIED WITH: Jene Every PharmD 10:25 08/03/18 ([email or web address])    Streptococcus species NOT DETECTED NOT DETECTED Final   Streptococcus agalactiae NOT DETECTED NOT DETECTED Final   Streptococcus pneumoniae NOT DETECTED NOT DETECTED Final   Streptococcus pyogenes NOT DETECTED NOT DETECTED Final   Acinetobacter baumannii NOT DETECTED NOT DETECTED Final   Enterobacteriaceae species NOT DETECTED NOT DETECTED Final   Enterobacter cloacae complex NOT DETECTED NOT DETECTED Final   Escherichia coli NOT DETECTED NOT DETECTED Final   Klebsiella oxytoca NOT DETECTED NOT DETECTED Final   Klebsiella pneumoniae NOT DETECTED NOT DETECTED Final   Proteus species NOT DETECTED NOT DETECTED Final   Serratia marcescens NOT DETECTED NOT DETECTED Final   Haemophilus influenzae NOT DETECTED NOT  DETECTED Final   Neisseria meningitidis NOT DETECTED NOT DETECTED Final   Pseudomonas aeruginosa NOT DETECTED NOT DETECTED Final   Candida albicans NOT DETECTED NOT DETECTED Final   Candida glabrata NOT DETECTED NOT DETECTED Final   Candida krusei NOT DETECTED NOT DETECTED Final   Candida parapsilosis NOT DETECTED NOT DETECTED Final   Candida tropicalis NOT DETECTED NOT DETECTED Final    Comment: Performed at [facility], [place] [address] [place], [place] [address]  MRSA PCR Screening     Status: None   Collection Time: 08/01/2018 11:35 PM    Specimen: Nasopharyngeal  Result Value Ref Range Status   MRSA by PCR NEGATIVE NEGATIVE Final    Comment:        The GeneXpert MRSA Assay (FDA approved for NASAL specimens only), is one component of a comprehensive MRSA colonization surveillance program. It is not intended to diagnose MRSA infection nor to guide or monitor treatment for MRSA infections. Performed at [facility], [place] [address] [place], [place] [address]   Culture, blood (routine x 2)     Status: None (Preliminary result)   Collection Time: 08/04/18  3:10 PM   Specimen: BLOOD  Result Value Ref Range Status   Specimen Description   Final    BLOOD RIGHT ARM Performed at [facility] [address] [place], [place] [address]    Special Requests   Final    BOTTLES DRAWN AEROBIC ONLY Blood Culture adequate volume Performed at [facility] [address], [place], [place] [address]    Culture   Final    NO GROWTH 4 DAYS Performed at [facility], [place] [address], [place], [place] [address]    Report Status PENDING  Incomplete  Culture, blood (routine x 2)     Status: None (Preliminary result)   Collection Time: 08/04/18  3:20 PM   Specimen: BLOOD  Result Value Ref Range Status   Specimen Description   Final    BLOOD RIGHT ARM Performed at [facility] [address], [place], [place] [address]    Special Requests   Final    BOTTLES DRAWN AEROBIC ONLY Blood Culture adequate volume Performed at [facility] [name], [place], [place] [address]    Culture   Final    NO GROWTH 4 DAYS Performed at [facility], [place] [address] [place], [place] [address]    Report Status PENDING  Incomplete    Lab Basic Metabolic Panel: Recent Labs  Lab 08/03/18 0748 08/04/18 0300 08/05/18 0850 08/06/18 0815 08/06/18 0902  NA 141 139 140 137 136  K 4.6 4.6 4.3 3.6 3.3*  CL 106 104 96* 93*  --   CO2  26 26 32 30  --   GLUCOSE 120* 210* 222* 265*  --   BUN 15 15 17 17   --   CREATININE 0.78 0.82 0.81 0.76  --   CALCIUM 7.5* 7.7* 9.1 9.2  --   MG 1.9  --   --   --   --   PHOS 3.3  --   --   --   --    Liver Function Tests: Recent Labs  Lab 08/03/18 0748 08/04/18 0300 08/05/18 0850 08/06/18 0815  AST 30 29 30 30   ALT 15 14 19 22   ALKPHOS 41 41 57 59  BILITOT 0.2* 0.1* <0.1* 0.5  PROT 6.2* 6.1* 7.8 7.8  ALBUMIN 2.4*  2.3* 3.1* 3.0*   No results for input(s): LIPASE, AMYLASE in the last 168 hours. No results for input(s): AMMONIA in the last 168 hours. CBC: Recent Labs  Lab 08/03/18 0748 08/04/18 0300 08/05/18 0400 08/06/18 0815 08/06/18 0902  WBC 4.9 5.0 14.4* 10.8*  --   HGB 12.6 12.9 15.7* 16.8* 18.0*  HCT 42.7 42.2 49.7* 51.6* 53.0*  MCV 96.4 94.8 91.9 89.1  --   PLT 145* 158 207 175  --    Cardiac Enzymes: No results for input(s): CKTOTAL, CKMB, CKMBINDEX, TROPONINI in the last 168 hours. Sepsis Labs: Recent Labs  Lab 08/05/2018 1314 08/03/18 0748 08/04/18 0300 08/05/18 0400 08/05/18 0850 08/06/18 0815  PROCALCITON  --  <0.10 <0.10  --  <0.10  --   WBC  --  4.9 5.0 14.4*  --  10.8*  LATICACIDVEN 1.1  --   --   --   --  2.2*      Taquisha Phung 08-16-2018, 12:30 PM

## 2018-08-23 DEATH — deceased
